# Patient Record
Sex: Male | Born: 1960
Health system: Southern US, Community
[De-identification: ages and names within clinical notes are randomized; demographics above are authoritative.]

## PROBLEM LIST (undated history)

## (undated) DIAGNOSIS — Z9889 Other specified postprocedural states: Secondary | ICD-10-CM

## (undated) DIAGNOSIS — R112 Nausea with vomiting, unspecified: Secondary | ICD-10-CM

## (undated) DIAGNOSIS — G43711 Chronic migraine without aura, intractable, with status migrainosus: Secondary | ICD-10-CM

## (undated) DIAGNOSIS — G43401 Hemiplegic migraine, not intractable, with status migrainosus: Secondary | ICD-10-CM

## (undated) DIAGNOSIS — F40240 Claustrophobia: Secondary | ICD-10-CM

## (undated) DIAGNOSIS — T7840XA Allergy, unspecified, initial encounter: Secondary | ICD-10-CM

## (undated) DIAGNOSIS — I739 Peripheral vascular disease, unspecified: Secondary | ICD-10-CM

## (undated) DIAGNOSIS — G43909 Migraine, unspecified, not intractable, without status migrainosus: Secondary | ICD-10-CM

## (undated) DIAGNOSIS — F1910 Other psychoactive substance abuse, uncomplicated: Secondary | ICD-10-CM

## (undated) DIAGNOSIS — F32A Depression, unspecified: Secondary | ICD-10-CM

## (undated) DIAGNOSIS — M1990 Unspecified osteoarthritis, unspecified site: Secondary | ICD-10-CM

## (undated) DIAGNOSIS — F10231 Alcohol dependence with withdrawal delirium: Secondary | ICD-10-CM

## (undated) DIAGNOSIS — I10 Essential (primary) hypertension: Secondary | ICD-10-CM

## (undated) DIAGNOSIS — I779 Disorder of arteries and arterioles, unspecified: Secondary | ICD-10-CM

## (undated) DIAGNOSIS — I639 Cerebral infarction, unspecified: Secondary | ICD-10-CM

## (undated) DIAGNOSIS — R9431 Abnormal electrocardiogram [ECG] [EKG]: Secondary | ICD-10-CM

## (undated) DIAGNOSIS — F329 Major depressive disorder, single episode, unspecified: Secondary | ICD-10-CM

## (undated) DIAGNOSIS — J449 Chronic obstructive pulmonary disease, unspecified: Secondary | ICD-10-CM

## (undated) DIAGNOSIS — I2510 Atherosclerotic heart disease of native coronary artery without angina pectoris: Secondary | ICD-10-CM

## (undated) DIAGNOSIS — K219 Gastro-esophageal reflux disease without esophagitis: Secondary | ICD-10-CM

## (undated) DIAGNOSIS — F419 Anxiety disorder, unspecified: Secondary | ICD-10-CM

## (undated) DIAGNOSIS — F10239 Alcohol dependence with withdrawal, unspecified: Secondary | ICD-10-CM

## (undated) DIAGNOSIS — G40909 Epilepsy, unspecified, not intractable, without status epilepticus: Secondary | ICD-10-CM

## (undated) DIAGNOSIS — F5104 Psychophysiologic insomnia: Secondary | ICD-10-CM

## (undated) DIAGNOSIS — F319 Bipolar disorder, unspecified: Secondary | ICD-10-CM

## (undated) HISTORY — DX: Essential (primary) hypertension: I10

## (undated) HISTORY — PX: COLONOSCOPY: SHX174

## (undated) HISTORY — DX: Cerebral infarction, unspecified: I63.9

## (undated) HISTORY — DX: Abnormal electrocardiogram (ECG) (EKG): R94.31

## (undated) HISTORY — DX: Psychophysiologic insomnia: F51.04

## (undated) HISTORY — PX: APPENDECTOMY: SHX54

## (undated) HISTORY — DX: Hemiplegic migraine, not intractable, with status migrainosus: G43.401

## (undated) HISTORY — DX: Claustrophobia: F40.240

## (undated) HISTORY — PX: KNEE ARTHROSCOPY: SUR90

## (undated) HISTORY — DX: Other specified postprocedural states: Z98.890

## (undated) HISTORY — DX: Allergy, unspecified, initial encounter: T78.40XA

## (undated) HISTORY — DX: Chronic obstructive pulmonary disease, unspecified: J44.9

## (undated) HISTORY — DX: Unspecified osteoarthritis, unspecified site: M19.90

## (undated) HISTORY — DX: Disorder of arteries and arterioles, unspecified: I77.9

## (undated) HISTORY — DX: Other specified postprocedural states: R11.2

## (undated) HISTORY — DX: Other psychoactive substance abuse, uncomplicated: F19.10

## (undated) HISTORY — PX: NOSE SURGERY: SHX723

## (undated) HISTORY — DX: Peripheral vascular disease, unspecified: I73.9

## (undated) HISTORY — PX: ANKLE SURGERY: SHX546

## (undated) HISTORY — DX: Atherosclerotic heart disease of native coronary artery without angina pectoris: I25.10

---
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
## 1998-06-04 ENCOUNTER — Emergency Department (HOSPITAL_COMMUNITY): Admission: EM | Admit: 1998-06-04 | Discharge: 1998-06-04 | Payer: Self-pay | Admitting: Internal Medicine

## 1999-05-06 ENCOUNTER — Encounter: Payer: Self-pay | Admitting: *Deleted

## 1999-05-06 ENCOUNTER — Encounter: Payer: Self-pay | Admitting: Emergency Medicine

## 1999-05-06 ENCOUNTER — Inpatient Hospital Stay (HOSPITAL_COMMUNITY): Admission: EM | Admit: 1999-05-06 | Discharge: 1999-05-09 | Payer: Self-pay | Admitting: Emergency Medicine

## 2002-03-23 ENCOUNTER — Emergency Department (HOSPITAL_COMMUNITY): Admission: EM | Admit: 2002-03-23 | Discharge: 2002-03-24 | Payer: Self-pay

## 2002-05-13 ENCOUNTER — Emergency Department (HOSPITAL_COMMUNITY): Admission: EM | Admit: 2002-05-13 | Discharge: 2002-05-13 | Payer: Self-pay

## 2002-05-30 ENCOUNTER — Encounter: Payer: Self-pay | Admitting: *Deleted

## 2002-05-30 ENCOUNTER — Emergency Department (HOSPITAL_COMMUNITY): Admission: EM | Admit: 2002-05-30 | Discharge: 2002-05-30 | Payer: Self-pay | Admitting: Emergency Medicine

## 2002-07-01 ENCOUNTER — Emergency Department (HOSPITAL_COMMUNITY): Admission: EM | Admit: 2002-07-01 | Discharge: 2002-07-01 | Payer: Self-pay | Admitting: Emergency Medicine

## 2002-07-06 ENCOUNTER — Encounter: Admission: RE | Admit: 2002-07-06 | Discharge: 2002-07-06 | Payer: Self-pay | Admitting: Internal Medicine

## 2003-01-12 ENCOUNTER — Ambulatory Visit (HOSPITAL_BASED_OUTPATIENT_CLINIC_OR_DEPARTMENT_OTHER): Admission: RE | Admit: 2003-01-12 | Discharge: 2003-01-12 | Payer: Self-pay | Admitting: Orthopedic Surgery

## 2003-11-30 ENCOUNTER — Other Ambulatory Visit: Payer: Self-pay

## 2004-01-24 ENCOUNTER — Emergency Department (HOSPITAL_COMMUNITY): Admission: EM | Admit: 2004-01-24 | Discharge: 2004-01-24 | Payer: Self-pay | Admitting: Emergency Medicine

## 2004-02-23 ENCOUNTER — Encounter: Admission: RE | Admit: 2004-02-23 | Discharge: 2004-02-23 | Payer: Self-pay | Admitting: Internal Medicine

## 2006-12-27 ENCOUNTER — Emergency Department (HOSPITAL_COMMUNITY): Admission: EM | Admit: 2006-12-27 | Discharge: 2006-12-28 | Payer: Self-pay | Admitting: Emergency Medicine

## 2006-12-28 ENCOUNTER — Emergency Department (HOSPITAL_COMMUNITY): Admission: EM | Admit: 2006-12-28 | Discharge: 2006-12-28 | Payer: Self-pay | Admitting: Emergency Medicine

## 2007-01-15 ENCOUNTER — Emergency Department (HOSPITAL_COMMUNITY): Admission: EM | Admit: 2007-01-15 | Discharge: 2007-01-16 | Payer: Self-pay | Admitting: Emergency Medicine

## 2007-03-12 ENCOUNTER — Emergency Department (HOSPITAL_COMMUNITY): Admission: EM | Admit: 2007-03-12 | Discharge: 2007-03-12 | Payer: Self-pay | Admitting: Emergency Medicine

## 2009-09-05 ENCOUNTER — Emergency Department (HOSPITAL_COMMUNITY): Admission: EM | Admit: 2009-09-05 | Discharge: 2009-09-06 | Payer: Self-pay | Admitting: Emergency Medicine

## 2010-03-06 ENCOUNTER — Emergency Department (HOSPITAL_COMMUNITY): Admission: EM | Admit: 2010-03-06 | Discharge: 2010-03-06 | Payer: Self-pay | Admitting: Emergency Medicine

## 2010-08-10 ENCOUNTER — Emergency Department (HOSPITAL_COMMUNITY): Admission: EM | Admit: 2010-08-10 | Discharge: 2010-08-12 | Payer: Self-pay | Admitting: Emergency Medicine

## 2010-11-03 ENCOUNTER — Emergency Department (HOSPITAL_COMMUNITY)
Admission: EM | Admit: 2010-11-03 | Discharge: 2010-11-03 | Payer: Self-pay | Source: Home / Self Care | Admitting: Emergency Medicine

## 2011-01-27 LAB — BASIC METABOLIC PANEL
BUN: 10 mg/dL (ref 6–23)
CO2: 23 mEq/L (ref 19–32)
Calcium: 9 mg/dL (ref 8.4–10.5)
Chloride: 106 mEq/L (ref 96–112)
Creatinine, Ser: 0.94 mg/dL (ref 0.4–1.5)
GFR calc Af Amer: >60 mL/min (ref >60)
GFR calc non Af Amer: >60 mL/min (ref >60)
Glucose, Bld: 94 mg/dL (ref 70–99)
Potassium: 3.8 mEq/L (ref 3.5–5.1)
Sodium: 140 mEq/L (ref 135–145)

## 2011-01-27 LAB — PROTEIN, CSF: Total  Protein, CSF: 38 mg/dL (ref 15–45)

## 2011-01-27 LAB — RAPID URINE DRUG SCREEN, HOSP PERFORMED
Amphetamines: NOT DETECTED
Barbiturates: NOT DETECTED
Benzodiazepines: NOT DETECTED
Cocaine: NOT DETECTED
Opiates: NOT DETECTED
Tetrahydrocannabinol: NOT DETECTED

## 2011-01-27 LAB — CSF CELL COUNT WITH DIFFERENTIAL
Eosinophils, CSF: 0 % (ref 0–1)
RBC Count, CSF: 0 /mm3
RBC Count, CSF: 7 /mm3 — ABNORMAL HIGH
Tube #: 1
Tube #: 4
WBC, CSF: 1 /mm3 (ref 0–5)
WBC, CSF: 1 /mm3 (ref 0–5)

## 2011-01-27 LAB — DIFFERENTIAL
Basophils Absolute: 0.1 10*3/uL (ref 0.0–0.1)
Basophils Relative: 1 % (ref 0–1)
Eosinophils Absolute: 0.7 10*3/uL (ref 0.0–0.7)
Eosinophils Relative: 8 % — ABNORMAL HIGH (ref 0–5)
Lymphocytes Relative: 49 % — ABNORMAL HIGH (ref 12–46)
Lymphs Abs: 4.3 10*3/uL — ABNORMAL HIGH (ref 0.7–4.0)
Monocytes Absolute: 0.5 10*3/uL (ref 0.1–1.0)
Monocytes Relative: 6 % (ref 3–12)
Neutro Abs: 3.2 10*3/uL (ref 1.7–7.7)
Neutrophils Relative %: 36 % — ABNORMAL LOW (ref 43–77)

## 2011-01-27 LAB — CBC
HCT: 48.2 % (ref 39.0–52.0)
Hemoglobin: 17.1 g/dL — ABNORMAL HIGH (ref 13.0–17.0)
MCH: 33.5 pg (ref 26.0–34.0)
MCHC: 35.5 g/dL (ref 30.0–36.0)
MCV: 94.5 fL (ref 78.0–100.0)
Platelets: 273 10*3/uL (ref 150–400)
RBC: 5.1 MIL/uL (ref 4.22–5.81)
RDW: 12.3 % (ref 11.5–15.5)
WBC: 8.8 10*3/uL (ref 4.0–10.5)

## 2011-01-27 LAB — CSF CULTURE W GRAM STAIN

## 2011-01-27 LAB — CSF CULTURE: Culture: NO GROWTH

## 2011-01-27 LAB — GLUCOSE, CAPILLARY: Glucose-Capillary: 123 mg/dL — ABNORMAL HIGH (ref 70–99)

## 2011-01-27 LAB — GRAM STAIN

## 2011-01-27 LAB — GLUCOSE, CSF: Glucose, CSF: 63 mg/dL (ref 43–76)

## 2011-01-27 LAB — PROTIME-INR
INR: 0.85 (ref 0.00–1.49)
Prothrombin Time: 11.8 seconds (ref 11.6–15.2)

## 2011-01-27 LAB — ETHANOL: Alcohol, Ethyl (B): 227 mg/dL — ABNORMAL HIGH (ref 0–10)

## 2011-01-27 LAB — APTT: aPTT: 28 seconds (ref 24–37)

## 2011-01-30 LAB — BASIC METABOLIC PANEL
BUN: 5 mg/dL — ABNORMAL LOW (ref 6–23)
CO2: 22 mEq/L (ref 19–32)
Calcium: 9.6 mg/dL (ref 8.4–10.5)
Chloride: 103 mEq/L (ref 96–112)
Creatinine, Ser: 0.97 mg/dL (ref 0.4–1.5)
GFR calc Af Amer: >60 mL/min (ref >60)
GFR calc non Af Amer: >60 mL/min (ref >60)
Glucose, Bld: 198 mg/dL — ABNORMAL HIGH (ref 70–99)
Potassium: 3.5 mEq/L (ref 3.5–5.1)
Sodium: 138 mEq/L (ref 135–145)

## 2011-01-30 LAB — CBC
HCT: 46.8 % (ref 39.0–52.0)
Hemoglobin: 16.5 g/dL (ref 13.0–17.0)
MCH: 34.2 pg — ABNORMAL HIGH (ref 26.0–34.0)
MCHC: 35.2 g/dL (ref 30.0–36.0)
MCV: 96.9 fL (ref 78.0–100.0)
Platelets: 308 10*3/uL (ref 150–400)
RBC: 4.83 MIL/uL (ref 4.22–5.81)
RDW: 12.5 % (ref 11.5–15.5)
WBC: 12.8 10*3/uL — ABNORMAL HIGH (ref 4.0–10.5)

## 2011-01-30 LAB — DIFFERENTIAL
Basophils Absolute: 0.1 10*3/uL (ref 0.0–0.1)
Basophils Relative: 1 % (ref 0–1)
Eosinophils Absolute: 0.2 10*3/uL (ref 0.0–0.7)
Eosinophils Relative: 1 % (ref 0–5)
Lymphocytes Relative: 21 % (ref 12–46)
Lymphs Abs: 2.7 10*3/uL (ref 0.7–4.0)
Monocytes Absolute: 0.6 10*3/uL (ref 0.1–1.0)
Monocytes Relative: 5 % (ref 3–12)
Neutro Abs: 9.2 10*3/uL — ABNORMAL HIGH (ref 1.7–7.7)
Neutrophils Relative %: 72 % (ref 43–77)

## 2011-01-30 LAB — RAPID URINE DRUG SCREEN, HOSP PERFORMED
Amphetamines: NOT DETECTED
Barbiturates: NOT DETECTED
Benzodiazepines: NOT DETECTED
Cocaine: POSITIVE — AB
Opiates: NOT DETECTED
Tetrahydrocannabinol: NOT DETECTED

## 2011-01-30 LAB — TRICYCLICS SCREEN, URINE: TCA Scrn: NOT DETECTED

## 2011-01-30 LAB — ETHANOL: Alcohol, Ethyl (B): 281 mg/dL — ABNORMAL HIGH (ref 0–10)

## 2011-02-04 LAB — DIFFERENTIAL
Basophils Absolute: 0.1 10*3/uL (ref 0.0–0.1)
Basophils Relative: 2 % — ABNORMAL HIGH (ref 0–1)
Eosinophils Absolute: 0.4 10*3/uL (ref 0.0–0.7)
Eosinophils Relative: 5 % (ref 0–5)
Lymphocytes Relative: 40 % (ref 12–46)
Lymphs Abs: 3.4 10*3/uL (ref 0.7–4.0)
Monocytes Absolute: 0.5 10*3/uL (ref 0.1–1.0)
Monocytes Relative: 6 % (ref 3–12)
Neutro Abs: 4.1 10*3/uL (ref 1.7–7.7)
Neutrophils Relative %: 48 % (ref 43–77)

## 2011-02-04 LAB — CBC
HCT: 45.9 % (ref 39.0–52.0)
Hemoglobin: 15.8 g/dL (ref 13.0–17.0)
MCHC: 34.5 g/dL (ref 30.0–36.0)
MCV: 97 fL (ref 78.0–100.0)
Platelets: 331 10*3/uL (ref 150–400)
RBC: 4.73 MIL/uL (ref 4.22–5.81)
RDW: 13.7 % (ref 11.5–15.5)
WBC: 8.6 10*3/uL (ref 4.0–10.5)

## 2011-02-04 LAB — ETHANOL: Alcohol, Ethyl (B): 290 mg/dL — ABNORMAL HIGH (ref 0–10)

## 2011-02-04 LAB — BASIC METABOLIC PANEL
BUN: 6 mg/dL (ref 6–23)
CO2: 24 mEq/L (ref 19–32)
Calcium: 9 mg/dL (ref 8.4–10.5)
Chloride: 111 mEq/L (ref 96–112)
Creatinine, Ser: 1.05 mg/dL (ref 0.4–1.5)
GFR calc Af Amer: >60 mL/min (ref >60)
GFR calc non Af Amer: >60 mL/min (ref >60)
Glucose, Bld: 105 mg/dL — ABNORMAL HIGH (ref 70–99)
Potassium: 3.6 mEq/L (ref 3.5–5.1)
Sodium: 144 mEq/L (ref 135–145)

## 2011-02-04 LAB — HEPATIC FUNCTION PANEL
ALT: 24 U/L (ref 0–53)
AST: 24 U/L (ref 0–37)
Albumin: 4.6 g/dL (ref 3.5–5.2)
Alkaline Phosphatase: 81 U/L (ref 39–117)
Bilirubin, Direct: 0.1 mg/dL (ref 0.0–0.3)
Indirect Bilirubin: 0.4 mg/dL (ref 0.3–0.9)
Total Bilirubin: 0.5 mg/dL (ref 0.3–1.2)
Total Protein: 7.4 g/dL (ref 6.0–8.3)

## 2011-04-04 NOTE — Op Note (Signed)
NAME:  Spencer Mendoza, Spencer Mendoza                       ACCOUNT NO.:  [id number]   MEDICAL RECORD NO.:  [id number]                   PATIENT TYPE:  AMB   LOCATION:  DSC                                  FACILITY:  [facility]   PHYSICIAN:  Nadara Mustard, M.D.                DATE OF BIRTH:  09/30/1961   DATE OF PROCEDURE:  01/12/2003  DATE OF DISCHARGE:                                 OPERATIVE REPORT   PREOPERATIVE DIAGNOSIS:  Medial meniscal tear, right knee.   POSTOPERATIVE DIAGNOSES:  1. Bucket-handle tear, medial meniscus.  2. Degenerative tear, lateral meniscus.   PROCEDURE:  Right knee arthroscopy with partial medial and lateral  meniscectomy.   SURGEON:  Nadara Mustard, M.D.   ANESTHESIA:  General.   ESTIMATED BLOOD LOSS:  Minimal.   ANTIBIOTICS:  None.   DRAINS:  None.   COMPLICATIONS:  None.   DISPOSITION:  To PACU in stable condition.   INDICATIONS FOR PROCEDURE:  The patient is a 50 year old gentleman with  mechanical catching and locking symptoms in his right knee.  The patient has  failed conservative care and MRI scan confirmed the medial meniscal tear and  he presents at this time for arthroscopic intervention.  The risks and  benefits were discussed including infection, neurovascular injury,  persistent pain, need for additional surgery.  The patient states he  understands and wishes to proceed at this time.   DESCRIPTION OF PROCEDURE:  The patient was brought to OR room 5 and  underwent a general anesthetic.  After adequate level of anesthesia  obtained, the patient's right lower extremity was prepped using Duraprep and  draped in a sterile field.  The scope was inserted from the inferolateral  portal and a working portal was established inferomedially.  Visualization  of the patellofemoral joint showed no pathology with no cartilage defects.  Examination of the unilateral joint line showed degenerative tearing of the  posterior horn of the lateral meniscus and on  examination of the notch he  had had an intact ACL.  Examination of the medial joint line showed him to  have a bucket-handle tear of the medial meniscus.  The ablator was used to  debride the medial meniscal tear.  The shaver was used for further  debridement and the VAPR Mitek was used for hemostasis.  Lateral joint line  in the figure-four position.  The shaver was used for debridement of the  degenerative tear.  Visualization of all three compartments after  debridement showed there to be no loose bodies.  There was no loose bodies  in the lateral gutter.  The instruments removed.  The portals were injected  with a total of 20 mL of 0.5% Marcaine plain.  The portals were  closed using 3-0 nylon.  The wounds were covered with Adaptic orthopedic  sponges, ABD dressing, Webril, and an ACE wrap from toe to the thigh.  The  patient  was extubated, taken to PACU in stable condition, discharged to  home, crutches, weightbearing as tolerated.  Change the dressing in three  days.  Plan to follow up in the office in one week.                                               Nadara Mustard, M.D.    MVD/MEDQ  D:  01/12/2003  T:  01/12/2003  Job:  [id number]

## 2013-07-28 ENCOUNTER — Inpatient Hospital Stay (HOSPITAL_COMMUNITY)
Admission: EM | Admit: 2013-07-28 | Discharge: 2013-08-03 | DRG: 894 | Discharge status: Against Medical Advice | Payer: Medicaid Other | Attending: Family Medicine | Admitting: Family Medicine

## 2013-07-28 ENCOUNTER — Encounter (HOSPITAL_COMMUNITY): Payer: Self-pay | Admitting: Emergency Medicine

## 2013-07-28 ENCOUNTER — Emergency Department (HOSPITAL_COMMUNITY): Payer: Medicaid Other

## 2013-07-28 DIAGNOSIS — R079 Chest pain, unspecified: Secondary | ICD-10-CM | POA: Diagnosis present

## 2013-07-28 DIAGNOSIS — F1020 Alcohol dependence, uncomplicated: Secondary | ICD-10-CM | POA: Diagnosis present

## 2013-07-28 DIAGNOSIS — G40909 Epilepsy, unspecified, not intractable, without status epilepticus: Secondary | ICD-10-CM | POA: Diagnosis present

## 2013-07-28 DIAGNOSIS — K920 Hematemesis: Secondary | ICD-10-CM

## 2013-07-28 DIAGNOSIS — K922 Gastrointestinal hemorrhage, unspecified: Secondary | ICD-10-CM | POA: Diagnosis present

## 2013-07-28 DIAGNOSIS — F1410 Cocaine abuse, uncomplicated: Secondary | ICD-10-CM | POA: Diagnosis present

## 2013-07-28 DIAGNOSIS — Z23 Encounter for immunization: Secondary | ICD-10-CM

## 2013-07-28 DIAGNOSIS — F10939 Alcohol use, unspecified with withdrawal, unspecified: Secondary | ICD-10-CM | POA: Diagnosis present

## 2013-07-28 DIAGNOSIS — E871 Hypo-osmolality and hyponatremia: Secondary | ICD-10-CM | POA: Diagnosis not present

## 2013-07-28 DIAGNOSIS — R1110 Vomiting, unspecified: Secondary | ICD-10-CM | POA: Diagnosis present

## 2013-07-28 DIAGNOSIS — E876 Hypokalemia: Secondary | ICD-10-CM | POA: Diagnosis not present

## 2013-07-28 DIAGNOSIS — K5900 Constipation, unspecified: Secondary | ICD-10-CM | POA: Diagnosis not present

## 2013-07-28 DIAGNOSIS — F10239 Alcohol dependence with withdrawal, unspecified: Secondary | ICD-10-CM | POA: Diagnosis present

## 2013-07-28 DIAGNOSIS — F17200 Nicotine dependence, unspecified, uncomplicated: Secondary | ICD-10-CM | POA: Diagnosis present

## 2013-07-28 DIAGNOSIS — F1010 Alcohol abuse, uncomplicated: Secondary | ICD-10-CM

## 2013-07-28 DIAGNOSIS — F10931 Alcohol use, unspecified with withdrawal delirium: Principal | ICD-10-CM | POA: Diagnosis present

## 2013-07-28 DIAGNOSIS — F10231 Alcohol dependence with withdrawal delirium: Principal | ICD-10-CM | POA: Diagnosis present

## 2013-07-28 DIAGNOSIS — K2920 Alcoholic gastritis without bleeding: Secondary | ICD-10-CM | POA: Diagnosis present

## 2013-07-28 DIAGNOSIS — F1210 Cannabis abuse, uncomplicated: Secondary | ICD-10-CM | POA: Diagnosis present

## 2013-07-28 HISTORY — DX: Migraine, unspecified, not intractable, without status migrainosus: G43.909

## 2013-07-28 HISTORY — DX: Epilepsy, unspecified, not intractable, without status epilepticus: G40.909

## 2013-07-28 LAB — RAPID URINE DRUG SCREEN, HOSP PERFORMED
Amphetamines: NOT DETECTED
Barbiturates: NOT DETECTED
Benzodiazepines: NOT DETECTED
Cocaine: POSITIVE — AB
Opiates: POSITIVE — AB
Tetrahydrocannabinol: POSITIVE — AB

## 2013-07-28 LAB — CBC
HCT: 49.3 % (ref 39.0–52.0)
HCT: 42.1 % (ref 39.0–52.0)
Hemoglobin: 17.6 g/dL — ABNORMAL HIGH (ref 13.0–17.0)
Hemoglobin: 15.3 g/dL (ref 13.0–17.0)
MCH: 34 pg (ref 26.0–34.0)
MCH: 34.3 pg — ABNORMAL HIGH (ref 26.0–34.0)
MCHC: 35.7 g/dL (ref 30.0–36.0)
MCHC: 36.3 g/dL — ABNORMAL HIGH (ref 30.0–36.0)
MCV: 95.4 fL (ref 78.0–100.0)
MCV: 94.4 fL (ref 78.0–100.0)
Platelets: 274 10*3/uL (ref 150–400)
Platelets: 194 10*3/uL (ref 150–400)
RBC: 5.17 MIL/uL (ref 4.22–5.81)
RBC: 4.46 MIL/uL (ref 4.22–5.81)
RDW: 13.1 % (ref 11.5–15.5)
RDW: 12.8 % (ref 11.5–15.5)
WBC: 13.3 10*3/uL — ABNORMAL HIGH (ref 4.0–10.5)
WBC: 6.7 10*3/uL (ref 4.0–10.5)

## 2013-07-28 LAB — HEPATIC FUNCTION PANEL
ALT: 33 U/L (ref 0–53)
AST: 60 U/L — ABNORMAL HIGH (ref 0–37)
Albumin: 4.9 g/dL (ref 3.5–5.2)
Alkaline Phosphatase: 87 U/L (ref 39–117)
Bilirubin, Direct: 0.3 mg/dL (ref 0.0–0.3)
Indirect Bilirubin: 2 mg/dL — ABNORMAL HIGH (ref 0.3–0.9)
Total Bilirubin: 2.3 mg/dL — ABNORMAL HIGH (ref 0.3–1.2)
Total Protein: 8.2 g/dL (ref 6.0–8.3)

## 2013-07-28 LAB — BASIC METABOLIC PANEL
BUN: 9 mg/dL (ref 6–23)
CO2: 23 mEq/L (ref 19–32)
Calcium: 10.5 mg/dL (ref 8.4–10.5)
Chloride: 93 mEq/L — ABNORMAL LOW (ref 96–112)
Creatinine, Ser: 0.98 mg/dL (ref 0.50–1.35)
GFR calc Af Amer: >90 mL/min (ref >90)
GFR calc non Af Amer: >90 mL/min (ref >90)
Glucose, Bld: 102 mg/dL — ABNORMAL HIGH (ref 70–99)
Potassium: 4.6 mEq/L (ref 3.5–5.1)
Sodium: 133 mEq/L — ABNORMAL LOW (ref 135–145)

## 2013-07-28 LAB — TROPONIN I: Troponin I: <0.3 ng/mL (ref <0.30)

## 2013-07-28 LAB — D-DIMER, QUANTITATIVE (NOT AT ARMC): D-Dimer, Quant: <0.27 ug/mL-FEU (ref 0.00–0.48)

## 2013-07-28 LAB — URINALYSIS, ROUTINE W REFLEX MICROSCOPIC
Bilirubin Urine: NEGATIVE
Glucose, UA: NEGATIVE mg/dL
Hgb urine dipstick: NEGATIVE
Ketones, ur: 15 mg/dL — AB
Leukocytes, UA: NEGATIVE
Nitrite: NEGATIVE
Protein, ur: NEGATIVE mg/dL
Specific Gravity, Urine: 1.014 (ref 1.005–1.030)
Urobilinogen, UA: 1 mg/dL (ref 0.0–1.0)
pH: 6.5 (ref 5.0–8.0)

## 2013-07-28 LAB — POCT I-STAT TROPONIN I: Troponin i, poc: 0 ng/mL (ref 0.00–0.08)

## 2013-07-28 LAB — LIPASE, BLOOD: Lipase: 28 U/L (ref 11–59)

## 2013-07-28 MED ORDER — LORAZEPAM 2 MG/ML IJ SOLN
1.0000 mg | Freq: Four times a day (QID) | INTRAMUSCULAR | Status: DC | PRN
  Administered 2013-07-29 – 2013-07-31 (×4): 1 mg via INTRAVENOUS
  Filled 2013-07-28 (×6): qty 1

## 2013-07-28 MED ORDER — ONDANSETRON HCL 4 MG PO TABS: 4.0000 mg | ORAL_TABLET | Freq: Four times a day (QID) | ORAL | Status: DC | PRN

## 2013-07-28 MED ORDER — ONDANSETRON HCL 4 MG/2ML IJ SOLN
4.0000 mg | Freq: Four times a day (QID) | INTRAMUSCULAR | Status: DC | PRN
  Administered 2013-07-29 – 2013-08-02 (×4): 4 mg via INTRAVENOUS
  Filled 2013-07-28 (×4): qty 2

## 2013-07-28 MED ORDER — LORAZEPAM 2 MG/ML IJ SOLN
0.0000 mg | Freq: Four times a day (QID) | INTRAMUSCULAR | Status: DC
  Administered 2013-07-28: 2 mg via INTRAVENOUS
  Filled 2013-07-28: qty 1

## 2013-07-28 MED ORDER — LORAZEPAM 1 MG PO TABS: 1.0000 mg | ORAL_TABLET | Freq: Four times a day (QID) | ORAL | Status: DC | PRN

## 2013-07-28 MED ORDER — THIAMINE HCL 100 MG/ML IJ SOLN
100.0000 mg | Freq: Every day | INTRAMUSCULAR | Status: DC
  Administered 2013-07-29: 12:00:00 via INTRAVENOUS
  Administered 2013-07-30 – 2013-08-01 (×3): 100 mg via INTRAVENOUS
  Filled 2013-07-28 (×5): qty 1

## 2013-07-28 MED ORDER — SODIUM CHLORIDE 0.9 % IV SOLN
INTRAVENOUS | Status: DC
  Administered 2013-07-28 – 2013-07-29 (×2): via INTRAVENOUS
  Administered 2013-07-29: 1000 mL via INTRAVENOUS
  Administered 2013-07-30 (×2): 125 mL/h via INTRAVENOUS
  Administered 2013-07-30: 13:00:00 via INTRAVENOUS
  Administered 2013-07-31 (×2): 125 mL/h via INTRAVENOUS
  Administered 2013-08-01: 75 mL/h via INTRAVENOUS
  Administered 2013-08-01: 125 mL/h via INTRAVENOUS

## 2013-07-28 MED ORDER — LORAZEPAM 2 MG/ML IJ SOLN
1.0000 mg | Freq: Once | INTRAMUSCULAR | Status: AC
  Administered 2013-07-28: 1 mg via INTRAVENOUS
  Filled 2013-07-28: qty 1

## 2013-07-28 MED ORDER — LORAZEPAM 1 MG PO TABS
1.0000 mg | ORAL_TABLET | Freq: Once | ORAL | Status: AC
  Administered 2013-07-28: 1 mg via ORAL
  Filled 2013-07-28: qty 1

## 2013-07-28 MED ORDER — VITAMIN B-1 100 MG PO TABS
100.0000 mg | ORAL_TABLET | Freq: Every day | ORAL | Status: DC
  Administered 2013-08-02 – 2013-08-03 (×2): 100 mg via ORAL
  Filled 2013-07-28 (×6): qty 1

## 2013-07-28 MED ORDER — MORPHINE SULFATE 4 MG/ML IJ SOLN
8.0000 mg | Freq: Once | INTRAMUSCULAR | Status: AC
  Administered 2013-07-28: 8 mg via INTRAVENOUS
  Filled 2013-07-28 (×2): qty 2

## 2013-07-28 MED ORDER — PANTOPRAZOLE SODIUM 40 MG IV SOLR
40.0000 mg | Freq: Two times a day (BID) | INTRAVENOUS | Status: DC
  Administered 2013-07-28 – 2013-08-01 (×8): 40 mg via INTRAVENOUS
  Filled 2013-07-28 (×12): qty 40

## 2013-07-28 MED ORDER — ASPIRIN 325 MG PO TABS
325.0000 mg | ORAL_TABLET | ORAL | Status: AC
  Administered 2013-07-28: 325 mg via ORAL
  Filled 2013-07-28: qty 1

## 2013-07-28 MED ORDER — MORPHINE SULFATE 4 MG/ML IJ SOLN
4.0000 mg | Freq: Once | INTRAMUSCULAR | Status: AC
  Administered 2013-07-28: 4 mg via INTRAVENOUS
  Filled 2013-07-28: qty 1

## 2013-07-28 MED ORDER — HYDROMORPHONE HCL PF 1 MG/ML IJ SOLN
1.0000 mg | INTRAMUSCULAR | Status: DC | PRN
  Administered 2013-07-28 – 2013-07-29 (×2): 1 mg via INTRAVENOUS
  Administered 2013-07-29: 0.5 mg via INTRAVENOUS
  Administered 2013-07-29 (×2): 1 mg via INTRAVENOUS
  Filled 2013-07-28 (×6): qty 1

## 2013-07-28 MED ORDER — LORAZEPAM 2 MG/ML IJ SOLN: 0.0000 mg | Freq: Two times a day (BID) | INTRAMUSCULAR | Status: DC

## 2013-07-28 MED ORDER — ONDANSETRON HCL 4 MG/2ML IJ SOLN
4.0000 mg | Freq: Once | INTRAMUSCULAR | Status: AC
  Administered 2013-07-28: 4 mg via INTRAVENOUS
  Filled 2013-07-28: qty 2

## 2013-07-28 MED ORDER — NICOTINE 14 MG/24HR TD PT24
14.0000 mg | MEDICATED_PATCH | Freq: Once | TRANSDERMAL | Status: DC
  Administered 2013-07-28: 14 mg via TRANSDERMAL
  Filled 2013-07-28: qty 1

## 2013-07-28 MED ORDER — SODIUM CHLORIDE 0.9 % IV BOLUS (SEPSIS)
1000.0000 mL | INTRAVENOUS | Status: AC
  Administered 2013-07-28: 1000 mL via INTRAVENOUS

## 2013-07-28 MED ORDER — NITROGLYCERIN 0.4 MG SL SUBL
0.4000 mg | SUBLINGUAL_TABLET | SUBLINGUAL | Status: AC | PRN
  Administered 2013-07-28 (×3): 0.4 mg via SUBLINGUAL

## 2013-07-28 MED ORDER — FOLIC ACID 1 MG PO TABS
1.0000 mg | ORAL_TABLET | Freq: Every day | ORAL | Status: DC
  Administered 2013-07-28: 1 mg via ORAL
  Filled 2013-07-28 (×4): qty 1

## 2013-07-28 MED ORDER — NICOTINE 21 MG/24HR TD PT24
21.0000 mg | MEDICATED_PATCH | Freq: Every day | TRANSDERMAL | Status: DC
  Filled 2013-07-28: qty 1

## 2013-07-28 MED ORDER — SODIUM CHLORIDE 0.9 % IJ SOLN
3.0000 mL | Freq: Two times a day (BID) | INTRAMUSCULAR | Status: DC
  Administered 2013-07-31 – 2013-08-03 (×6): 3 mL via INTRAVENOUS

## 2013-07-28 MED ORDER — ADULT MULTIVITAMIN W/MINERALS CH
1.0000 | ORAL_TABLET | Freq: Every day | ORAL | Status: DC
  Administered 2013-08-01 – 2013-08-03 (×3): 1 via ORAL
  Filled 2013-07-28 (×7): qty 1

## 2013-07-28 MED ORDER — HYDROMORPHONE HCL PF 1 MG/ML IJ SOLN
1.0000 mg | Freq: Once | INTRAMUSCULAR | Status: AC
  Administered 2013-07-28: 1 mg via INTRAVENOUS
  Filled 2013-07-28: qty 1

## 2013-07-28 NOTE — ED Notes (Signed)
Pt placed in gown, on monitor, continuous pulse oximetry and blood pressure cuff; family at bedside

## 2013-07-28 NOTE — ED Notes (Signed)
Pt handed urinal to use

## 2013-07-28 NOTE — ED Provider Notes (Signed)
Patient signed out to me by Doctor Romeo Apple to followup on cardiology consult. Cardiology has evaluated the patient and will not be admitting the patient to their service. They have recommended internal medicine admission and they have consulted.  Patient with left-sided chest pain has been present for several hours. He does report worsening with exertion. It is still present, administer morphine and placed on telemetry for further cardiac evaluation.  Gilda Crease, MD 07/28/13 [id number]

## 2013-07-28 NOTE — ED Provider Notes (Signed)
CSN: [id number]     Arrival date & time 07/28/13  1022 History   First MD Initiated Contact with Patient 07/28/13 1046     Chief Complaint  Patient presents with  . Chest Pain  . Emesis   (Consider location/radiation/quality/duration/timing/severity/associated sxs/prior Treatment) Patient is a 52 y.o. male presenting with chest pain and vomiting. The history is provided by the patient.  Chest Pain Pain location:  L chest Pain quality: tightness   Pain radiates to:  Does not radiate Pain radiates to the back: no   Pain severity:  Moderate Onset quality:  Gradual Duration:  3 hours Timing:  Constant Progression:  Worsening Chronicity:  Recurrent Context comment:  While at work Relieved by:  Nothing Worsened by:  Exertion Ineffective treatments:  None tried Associated symptoms: nausea and vomiting   Associated symptoms: no abdominal pain, no cough, no fever, no headache, no numbness and no shortness of breath   Emesis Associated symptoms: diarrhea   Associated symptoms: no abdominal pain and no headaches     History reviewed. No pertinent past medical history. History reviewed. No pertinent past surgical history. History reviewed. No pertinent family history. History  Substance Use Topics  . Smoking status: Current Every Day Smoker  . Smokeless tobacco: Not on file  . Alcohol Use: Yes    Review of Systems  Constitutional: Negative for fever.  HENT: Negative for rhinorrhea, drooling and neck pain.   Eyes: Negative for pain.  Respiratory: Negative for cough and shortness of breath.   Cardiovascular: Positive for chest pain. Negative for leg swelling.  Gastrointestinal: Positive for nausea, vomiting and diarrhea. Negative for abdominal pain.  Genitourinary: Negative for dysuria and hematuria.  Musculoskeletal: Negative for gait problem.  Skin: Negative for color change.  Neurological: Positive for syncope (near syncopal episode 5 days ago). Negative for numbness and  headaches.       Paresthesias in UE's  Hematological: Negative for adenopathy.  Psychiatric/Behavioral: Negative for behavioral problems.  All other systems reviewed and are negative.    Allergies  Review of patient's allergies indicates no known allergies.  Home Medications   Current Outpatient Rx  Name  Route  Sig  Dispense  Refill  . Aspirin-Acetaminophen-Caffeine (GOODY HEADACHE PO)   Oral   Take 1 packet by mouth daily as needed. For pain          There were no vitals taken for this visit. Physical Exam  Nursing note and vitals reviewed. Constitutional: He is oriented to person, place, and time. He appears well-developed and well-nourished.  Tremulous.   HENT:  Head: Normocephalic and atraumatic.  Right Ear: External ear normal.  Left Ear: External ear normal.  Nose: Nose normal.  Mouth/Throat: Oropharynx is clear and moist. No oropharyngeal exudate.  Eyes: Conjunctivae and EOM are normal. Pupils are equal, round, and reactive to light.  Neck: Normal range of motion. Neck supple.  Cardiovascular: Normal rate, regular rhythm, normal heart sounds and intact distal pulses.  Exam reveals no gallop and no friction rub.   No murmur heard. Pulmonary/Chest: Effort normal and breath sounds normal. No respiratory distress. He has no wheezes.  Abdominal: Soft. Bowel sounds are normal. He exhibits no distension. There is no tenderness. There is no rebound and no guarding.  Musculoskeletal: Normal range of motion. He exhibits no edema and no tenderness.  Neurological: He is alert and oriented to person, place, and time.  Skin: Skin is warm and dry.  Psychiatric: He has a normal mood and affect.  His behavior is normal.    ED Course  Procedures (including critical care time) Labs Review Labs Reviewed  CBC - Abnormal; Notable for the following:    WBC 13.3 (*)    Hemoglobin 17.6 (*)    All other components within normal limits  BASIC METABOLIC PANEL  HEPATIC FUNCTION PANEL   LIPASE, BLOOD  POCT I-STAT TROPONIN I   Imaging Review Dg Chest 2 View  07/28/2013   *RADIOLOGY REPORT*  Clinical Data: Chest pain, emesis  CHEST - 2 VIEW  Comparison:  01/15/2007  Findings:  The heart size and mediastinal contours are within normal limits.  Both lungs are clear.  The visualized skeletal structures are unremarkable.  IMPRESSION: No active cardiopulmonary disease.   Original Report Authenticated By: Judie Petit. Miles Costain, M.D.    Date: 07/28/2013  Rate: 94  Rhythm: normal sinus rhythm  QRS Axis: right  Intervals: normal  ST/T Wave abnormalities: normal  Conduction Disutrbances:none  Narrative Interpretation: No ST or T wave changes consistent with ischemia.  Poor R wave progression.  Old EKG Reviewed: none available   MDM   1. Chest pain   2. Vomiting    11:15 AM 52 y.o. male with history of EtOH abuse who presents with chest pain. The patient notes that he's had intermittent nausea vomiting and diarrhea the last several weeks. He notes that he has daily left-sided chest tightness that is worse with exertion. Approximately 3 hours ago while he was working he noted a worsening of the left-sided chest tightness. He states that the pain has been constant since then. The patient is afebrile and vital signs are unremarkable here. He only drinks 6 beers per day. He states that his last drink was last night and he only had one beer. He is tremulous on exam.  UDS positive for cocaine. I asked the pt about this, he now admits to using a small amount of cocaine 2 nights ago, but denies using any today.   Transferred care to Dr. Blinda Leatherwood while awaiting cardiology consult.   Junius Argyle, MD 07/28/13 2051

## 2013-07-28 NOTE — H&P (Signed)
[facility] Admission History and Physical Service Pager: [phone]  Patient name: Spencer Mendoza Medical record number: [id number] Date of birth: 12-30-1960 Age: 52 y.o. Gender: male  Primary Care Provider: No PCP Per Patient Consultants: Cardiology, GI Code Status: Full  Chief Complaint: Chest pain  Assessment and Plan:Spencer Mendoza is a 52 y.o. male presenting with acute on chronic chest pain, Blood streaked to frank blood vomit and melana for 1 month. [name] PMH is significant for seizure disorder (childhood) and alcohol abuse. UDS positive for cocaine and THC.   Chest Pain: Cardiology consulted in ED. They felt CP was atypical. EKG with right axis deviation and no ST changes. POCT trop and cycled x1 are normal. Lipase normal, ruling out pancreatitis. VSS. Small concern for PE, with atypical chest pain. Well's score : 3.6% low risk. Heart score: 3. Admitted to telemetry unit. - Dilaudid for pain - Cycled trop x3 ordered - We appreciated cardiology consultation and believe he will likely need cardiac work up for CAD.  - D-Dimer obtained d/t right axis deviation and atypical presentation of pain near diaphragm.  - ECHO ordered - Risk stratification labs ordered  Vomit/diarrhea: - GI consulted for Melana and hematemesis;  we appreciate their recommendations and for examining this patient. Patient placed on NPO after midnight for possible endoscopy procedure tomorrow.  - Started PPI IV BID - Zofran - Trending hemoglobins, patient was dry on initial exam and is a heavy smoker. Initial hgb 17 --> 15.3 - FOBT  Substance abuse: Patient admits to daily alcohol use and marijuana use. His UDS was positive for cocaine and THC, however he states he took a small taste via his mouth because his friend told him it would help with his nausea, otherwise he never used it before. Last drink was last night, mild tremors noted on exam.  - Discussed the negative effects of  marijuana and cyclic vomiting. Discussed negative effects of cocaine use with chest pain.  - CIWA protocol with ativan    Epilepsy: Childhood history of seizures. He was prescribed dilatin, but has not been taking for over 8 years. He states he has not had a seizure for >8 years.   FEN/GI: Heart healthy --> NPO after midnight  Prophylaxis: SCD's  Disposition: Pending GI recommendations and further cardiac work-up.   History of Present Illness: Spencer Mendoza is a 52 y.o. male presenting with acute on chronic chest pain. PMH is significant for seizure disorder (childhood) and alcohol abuse. Patient endorses everyday chest pain on exertion for the past year. He has difficulty lying flat d/t shortness of breath (but only sleeps on one large pillow). He experienced a sharp pain and tightness, in the left side of his chest that radiated to bilateral upper extremities on Sunday. He reports tingling in his fingers at this time. The pain lasted "hours" and had to lay on the couch until it went away. He experienced an episode of vomit at this time as well, that was reportedly "blood streaked." He endorses daily vomiting for the past month (1 1/2 months), that he experiences blood streaks or "fills the entire bowl with red". He reports he has been able to tolerate very little on his stomach without vomit. He also endorses 1 month of "black tarry liquid stools." He denies use of iron supplementation. He has tried to stop the diarrhea with Pepto bismol and imodium, with no relief. He has not taken Mylanta, Pepto or imodium in over a week and  he continues to have "black" stools. On the day of admission, he again experienced a sharp/tight feeling in the left side of his chest while attempting to lift something heavy at work. 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 He admits to frequent episodes of dizziness  while bending over, and increased fatigue over the last month. He admits to daily ETOH consumption of 5 beers, more if there is an event or game he is watching and daily marijuana use. He frequently uses BC powder for headaches (1 packet a day, >4 days a week). He smokes about 1 PPD.   Review Of Systems: Per HPI  Otherwise 12 point review of systems was performed and was unremarkable.  Patient Active Problem List   Diagnosis Date Noted  . Chest pain 07/28/2013  . Vomiting 07/28/2013   Past Medical History: Past Medical History  Diagnosis Date  . Epilepsy   . Migraine    Past Surgical History: Past Surgical History  Procedure Laterality Date  . Knee arthroscopy Right   . Appendectomy     Social History: History  Substance Use Topics  . Smoking status: Current Every Day Smoker -- 1.00 packs/day for 35 years  . Smokeless tobacco: Not on file  . Alcohol Use: Yes     Comment: Drinks 4-5 beers daily   Additional social history: daily Marijuana use Please also refer to relevant sections of EMR.  Family History: History reviewed. No pertinent family history. Allergies and Medications: No Known Allergies No current facility-administered medications on file prior to encounter.   No current outpatient prescriptions on file prior to encounter.    Objective: BP 140/88  Pulse 62  Resp 20  SpO2 96% Exam: General: Mildly distressed after 8 mg morphine. Pleasant thin male. Cooperative and honest.  HEENT: AT. [place]. Bilateral TM visualized and normal in appearance. Bilateral eyes without icterus, mild injections present. Tachy mucous membranes. Bilateral nares normal. Throat without erythema or exudates. Poor dentition with multiple missing and decaying teeth.  CV: RRR. No murmur appreciated.  Chest: CTAB, no wheeze or crackles Abd: Soft.ND. TTP LUQ. BS present. No Masses palpated. tattoo present. Ext: No erythema. No edema. 2/4 bilateral pulses. Mild UE tremors bilaterally.  Skin:  No rashes, purpura or petechiae.  Neuro:  PERLA. EOMi. Alert. CN II-XII Grossly intact. No focal deficits. Msk strength 5/5 UE/LE.    Labs and Imaging: CBC BMET   Recent Labs Lab 07/28/13 1034  WBC 13.3*  HGB 17.6*  HCT 49.3  PLT 274    Recent Labs Lab 07/28/13 1034  NA 133*  K 4.6  CL 93*  CO2 23  BUN 9  CREATININE 0.98  GLUCOSE 102*  CALCIUM 10.5     Dg Chest 2 View  07/28/2013   *RADIOLOGY REPORT*  Clinical Data: Chest pain, emesis  CHEST - 2 VIEW  Comparison:  01/15/2007  Findings:  The heart size and mediastinal contours are within normal limits.  Both lungs are clear.  The visualized skeletal structures are unremarkable.  IMPRESSION: No active cardiopulmonary disease.   Original Report Authenticated By: Judie Petit. Miles Costain, M.D.    Natalia Leatherwood, DO 07/28/2013, 6:59 PM PGY-2, [facility] Intern pager: [phone], text pages welcome

## 2013-07-28 NOTE — ED Notes (Signed)
Pt given another cup of ice water; family at bedside; ok'd by [employer]

## 2013-07-28 NOTE — Consult Note (Signed)
Consult   Patient ID: Spencer Mendoza MRN: [id number], DOB/AGE: [birth year] 54 y.o. Date of Encounter: 07/28/2013  Primary Physician: No PCP Per Patient - DOT physical 8 mo ago Primary Cardiologist: New  Chief Complaint:  Chest pain  HPI: Spencer Mendoza is a 52 y.o. male with no history of CAD. Pt has had daily chest pain with exertion for a year or 2. It is left-sided, dull ache. It is a 2-3/10. He will have it for hours or days.  Pt had severe chest pain 5 days ago, started without exertion. He had decreased LOC episode, no seizure activity. The pain was worse with deep inspiration or cough. It radiated to his left arm.  His chest wall was not tender. He felt short of breath and light-headed. No diaphoresis, nausea or vomiting. The severe pain lasted 24 hours, at a 10/10. He did not try any medications for it. The pain improved to a 2/10 (his usual). That did not change until today. He was exerting himself at work and the pain increased to a 10/10. He became light-headed and was brought to the ER. In the ER, he received SL NTG x 3 (no change in the pain), morphine 4 mg, Ativan 1 mg, Dilaudid 1 mg, and ASA 325 mg.  He has been having problems with N&V, sometimes multiple times in a day, black stools, occasional red streaks in the vomitus. He has felt cold recently and had chills, but did not check a temperature.  Past Medical History  Diagnosis Date  . Epilepsy   . Migraine     Surgical History:  Past Surgical History  Procedure Laterality Date  . Knee arthroscopy Right   . Appendectomy       I have reviewed the patient's current medications. Prior to Admission medications   Medication Sig Start Date End Date Taking? Authorizing Provider  Aspirin-Acetaminophen-Caffeine (GOODY HEADACHE PO) Take 1 packet by mouth daily as needed. For pain   Yes Historical Provider, MD   Scheduled Meds: . nicotine  14 mg Transdermal Once   Continuous Infusions:  PRN Meds:.  Allergies: No  Known Allergies  History   Social History  . Marital Status: Divorced    Spouse Name: N/A    Number of Children: N/A  . Years of Education: N/A   Occupational History  . Operates heavy machinery    Social History Main Topics  . Smoking status: Current Every Day Smoker -- 1.00 packs/day for 35 years  . Smokeless tobacco: Not on file  . Alcohol Use: Yes     Comment: Drinks 4-5 beers daily  . Drug Use: No  . Sexual Activity: Not on file   Other Topics Concern  . Not on file   Social History Narrative   Pt lives with significant other.     Family Status  Relation Status Death Age  . Father Alive Born in [place]    Had CABG age 7  . Mother Deceased 25    Had CABG age 76    Review of Systems:   Full 14-point review of systems otherwise negative except as noted above.  Physical Exam: Blood pressure 140/88, pulse 62, resp. rate 20, SpO2 96.00%. General: Well developed, well nourished,male extremely anxious and tremulous. Head: Normocephalic, atraumatic, sclera non-icteric, no xanthomas, nares are without discharge. Dentition: poor Neck: No carotid bruits. JVD not elevated. No thyromegally Lungs: Good expansion bilaterally. without wheezes or rhonchi.  Heart: IRRegular rate and rhythm with S1 S2.  No  S3 or S4.  No murmur, no rubs, or gallops appreciated. Abdomen: Soft, non-tender, non-distended with normoactive bowel sounds. No hepatomegaly. No rebound/guarding. No obvious abdominal masses. Msk:  Strength and tone appear normal for age. No joint deformities or effusions, no spine or costo-vertebral angle tenderness. Extremities: No clubbing or cyanosis. No edema.  Distal pedal pulses are 2+ in 4 extrem Neuro: Alert and oriented X 3. Moves all extremities spontaneously. No focal deficits noted. Skin: No rashes or lesions noted; tattoos  Labs:   Lab Results  Component Value Date   WBC 13.3* 07/28/2013   HGB 17.6* 07/28/2013   HCT 49.3 07/28/2013   MCV 95.4 07/28/2013   PLT  274 07/28/2013     Recent Labs Lab 07/28/13 1034  NA 133*  K 4.6  CL 93*  CO2 23  BUN 9  CREATININE 0.98  CALCIUM 10.5  PROT 8.2  BILITOT 2.3*  ALKPHOS 87  ALT 33  AST 60*  GLUCOSE 102*    Recent Labs  07/28/13 1041  TROPIPOC 0.00   Drugs of Abuse     Component Value Date/Time   LABOPIA POSITIVE* 07/28/2013 1406   COCAINSCRNUR POSITIVE* 07/28/2013 1406   LABBENZ NONE DETECTED 07/28/2013 1406   AMPHETMU NONE DETECTED 07/28/2013 1406   THCU POSITIVE* 07/28/2013 1406   LABBARB NONE DETECTED 07/28/2013 1406     Radiology/Studies: Dg Chest 2 View 07/28/2013   *RADIOLOGY REPORT*  Clinical Data: Chest pain, emesis  CHEST - 2 VIEW  Comparison:  01/15/2007  Findings:  The heart size and mediastinal contours are within normal limits.  Both lungs are clear.  The visualized skeletal structures are unremarkable.  IMPRESSION: No active cardiopulmonary disease.   Original Report Authenticated By: Judie Petit. Shick, M.D.    ECG: sinus rhythm, right axis deviation, no ST changes.  ASSESSMENT AND PLAN:  Active Problems:   Chest pain   Vomiting   Signed, Spencer Demark, Spencer Mendoza 07/28/2013 5:54 PM Beeper [phone]  As above, patient seen and examined. Briefly he is a 52 year old male with a remote history of epilepsy who I last evaluated for chest pain. The patient has had continuous chest pain in the left breast area for 2 years. Sunday he had another pain that was more severe. He stated his left arm was tingling. It lasted 24 hours. He had shortness of breath and diaphoresis. He has had nausea continuously for 3 weeks. The pain never completely resolved but increased again today and he presented to the emergency room. He also states he has had hematemesis with his nausea and vomiting and black stools. He is noted to have cocaine in his drug screen. Electrocardiogram shows sinus rhythm, right axis deviation and no ST changes. 1 chest pain-patient symptoms are extremely atypical. He has had continuous  chest pain for 2 years. His more recent episode which was more severe has been present for approximately 4 days. Initial enzymes are negative. Electrocardiogram shows right axis deviation but no ST changes. Would continue cycle enzymes and if negative patient can have an exercise treadmill for risk stratification. He may need GI evaluation with description of hematemesis and melena. Will leave to primary care. Would follow hemoglobin and treat with Protonix. Given right axis deviation would check d-dimer. 2 substance abuse-cocaine in the patient's system. He states he did this once 2 days ago for nausea. I discussed the importance of avoiding in future. He is very tremulous at the time of this evaluation. I wonder about the possibility of withdrawal. Management per  primary care. Spencer Mendoza 6:30 PM

## 2013-07-28 NOTE — ED Notes (Signed)
Pt c/o left sided CP and bilateral arm numbness; pt sts N/V; pt noted to be shaking all over; pt admits to heavy ETOH abuse normally; pt sts near syncope today and Saturday; pt sts decreased ETOH intake since being sick last drank 1 beer last night

## 2013-07-28 NOTE — ED Notes (Signed)
Pt states last drink was around 7pm last night. He states he normally drinks 5-6 beers a day. Pt c/o left sided cp with no radiation described as tightness.

## 2013-07-29 ENCOUNTER — Other Ambulatory Visit: Payer: Self-pay

## 2013-07-29 ENCOUNTER — Encounter (HOSPITAL_COMMUNITY): Payer: Self-pay | Admitting: *Deleted

## 2013-07-29 DIAGNOSIS — F10239 Alcohol dependence with withdrawal, unspecified: Secondary | ICD-10-CM | POA: Diagnosis present

## 2013-07-29 DIAGNOSIS — F10231 Alcohol dependence with withdrawal delirium: Secondary | ICD-10-CM

## 2013-07-29 DIAGNOSIS — K922 Gastrointestinal hemorrhage, unspecified: Secondary | ICD-10-CM | POA: Diagnosis present

## 2013-07-29 DIAGNOSIS — I059 Rheumatic mitral valve disease, unspecified: Secondary | ICD-10-CM

## 2013-07-29 DIAGNOSIS — F10931 Alcohol use, unspecified with withdrawal delirium: Secondary | ICD-10-CM

## 2013-07-29 DIAGNOSIS — F1010 Alcohol abuse, uncomplicated: Secondary | ICD-10-CM

## 2013-07-29 DIAGNOSIS — R1110 Vomiting, unspecified: Secondary | ICD-10-CM

## 2013-07-29 DIAGNOSIS — K920 Hematemesis: Secondary | ICD-10-CM

## 2013-07-29 DIAGNOSIS — F10939 Alcohol use, unspecified with withdrawal, unspecified: Secondary | ICD-10-CM

## 2013-07-29 HISTORY — DX: Alcohol dependence with withdrawal, unspecified: F10.239

## 2013-07-29 HISTORY — DX: Alcohol use, unspecified with withdrawal delirium: F10.931

## 2013-07-29 HISTORY — DX: Alcohol dependence with withdrawal delirium: F10.231

## 2013-07-29 HISTORY — DX: Alcohol use, unspecified with withdrawal, unspecified: F10.939

## 2013-07-29 LAB — CBC
HCT: 43.7 % (ref 39.0–52.0)
Hemoglobin: 15.7 g/dL (ref 13.0–17.0)
MCH: 34.2 pg — ABNORMAL HIGH (ref 26.0–34.0)
MCHC: 35.9 g/dL (ref 30.0–36.0)
MCV: 95.2 fL (ref 78.0–100.0)
Platelets: 181 10*3/uL (ref 150–400)
RBC: 4.59 MIL/uL (ref 4.22–5.81)
RDW: 12.8 % (ref 11.5–15.5)
WBC: 8.2 10*3/uL (ref 4.0–10.5)

## 2013-07-29 LAB — LIPID PANEL
Cholesterol: 159 mg/dL (ref 0–200)
HDL: 82 mg/dL (ref >39)
LDL Cholesterol: 57 mg/dL (ref 0–99)
Total CHOL/HDL Ratio: 1.9 RATIO
Triglycerides: 99 mg/dL (ref <150)
VLDL: 20 mg/dL (ref 0–40)

## 2013-07-29 LAB — COMPREHENSIVE METABOLIC PANEL
ALT: 25 U/L (ref 0–53)
ALT: 25 U/L (ref 0–53)
AST: 38 U/L — ABNORMAL HIGH (ref 0–37)
AST: 36 U/L (ref 0–37)
Albumin: 3.6 g/dL (ref 3.5–5.2)
Albumin: 3.6 g/dL (ref 3.5–5.2)
Alkaline Phosphatase: 69 U/L (ref 39–117)
Alkaline Phosphatase: 68 U/L (ref 39–117)
BUN: 7 mg/dL (ref 6–23)
BUN: 7 mg/dL (ref 6–23)
CO2: 24 mEq/L (ref 19–32)
CO2: 24 mEq/L (ref 19–32)
Calcium: 9 mg/dL (ref 8.4–10.5)
Calcium: 8.8 mg/dL (ref 8.4–10.5)
Chloride: 98 mEq/L (ref 96–112)
Chloride: 99 mEq/L (ref 96–112)
Creatinine, Ser: 0.92 mg/dL (ref 0.50–1.35)
Creatinine, Ser: 0.93 mg/dL (ref 0.50–1.35)
GFR calc Af Amer: >90 mL/min (ref >90)
GFR calc Af Amer: >90 mL/min (ref >90)
GFR calc non Af Amer: >90 mL/min (ref >90)
GFR calc non Af Amer: >90 mL/min (ref >90)
Glucose, Bld: 82 mg/dL (ref 70–99)
Glucose, Bld: 77 mg/dL (ref 70–99)
Potassium: 3.4 mEq/L — ABNORMAL LOW (ref 3.5–5.1)
Potassium: 3.5 mEq/L (ref 3.5–5.1)
Sodium: 133 mEq/L — ABNORMAL LOW (ref 135–145)
Sodium: 134 mEq/L — ABNORMAL LOW (ref 135–145)
Total Bilirubin: 1.4 mg/dL — ABNORMAL HIGH (ref 0.3–1.2)
Total Bilirubin: 1.4 mg/dL — ABNORMAL HIGH (ref 0.3–1.2)
Total Protein: 6.5 g/dL (ref 6.0–8.3)
Total Protein: 6.3 g/dL (ref 6.0–8.3)

## 2013-07-29 LAB — TROPONIN I
Troponin I: <0.3 ng/mL (ref <0.30)
Troponin I: <0.3 ng/mL (ref <0.30)

## 2013-07-29 LAB — HEMOGLOBIN A1C
Hgb A1c MFr Bld: 4.9 % (ref <5.7)
Mean Plasma Glucose: 94 mg/dL (ref <117)

## 2013-07-29 LAB — MRSA PCR SCREENING: MRSA by PCR: NEGATIVE

## 2013-07-29 LAB — TSH: TSH: 3.746 u[IU]/mL (ref 0.350–4.500)

## 2013-07-29 LAB — PROTIME-INR
INR: 0.93 (ref 0.00–1.49)
Prothrombin Time: 12.3 seconds (ref 11.6–15.2)

## 2013-07-29 MED ORDER — INFLUENZA VAC SPLIT QUAD 0.5 ML IM SUSP
0.5000 mL | INTRAMUSCULAR | Status: AC
  Administered 2013-07-30: 0.5 mL via INTRAMUSCULAR
  Filled 2013-07-29: qty 0.5

## 2013-07-29 MED ORDER — LORAZEPAM 2 MG/ML IJ SOLN
4.0000 mg | Freq: Once | INTRAMUSCULAR | Status: AC
  Administered 2013-07-29: 2 mg via INTRAVENOUS
  Filled 2013-07-29: qty 2

## 2013-07-29 MED ORDER — DEXMEDETOMIDINE HCL IN NACL 200 MCG/50ML IV SOLN
0.4000 ug/kg/h | INTRAVENOUS | Status: DC
  Administered 2013-07-29 – 2013-07-30 (×2): 0.5 ug/kg/h via INTRAVENOUS
  Administered 2013-07-30: 1.023 ug/kg/h via INTRAVENOUS
  Administered 2013-07-30: 0.409 ug/kg/h via INTRAVENOUS
  Administered 2013-07-30: 0.5 ug/kg/h via INTRAVENOUS
  Filled 2013-07-29 (×7): qty 50

## 2013-07-29 MED ORDER — HYDROMORPHONE HCL PF 1 MG/ML IJ SOLN
0.5000 mg | INTRAMUSCULAR | Status: DC | PRN
  Administered 2013-07-30 – 2013-07-31 (×5): 0.5 mg via INTRAVENOUS
  Filled 2013-07-29 (×4): qty 1

## 2013-07-29 MED ORDER — POTASSIUM CHLORIDE CRYS ER 20 MEQ PO TBCR: 40.0000 meq | EXTENDED_RELEASE_TABLET | Freq: Once | ORAL | Status: DC

## 2013-07-29 MED ORDER — NICOTINE 21 MG/24HR TD PT24
21.0000 mg | MEDICATED_PATCH | Freq: Every day | TRANSDERMAL | Status: DC
  Administered 2013-07-29 – 2013-08-01 (×4): 21 mg via TRANSDERMAL
  Filled 2013-07-29 (×5): qty 1

## 2013-07-29 MED ORDER — LORAZEPAM 2 MG/ML IJ SOLN
1.0000 mg | INTRAMUSCULAR | Status: DC
  Administered 2013-07-29: 2 mg via INTRAVENOUS
  Administered 2013-07-29: 4 mg via INTRAVENOUS
  Administered 2013-07-29 (×2): 2 mg via INTRAVENOUS
  Administered 2013-07-29: 4 mg via INTRAVENOUS
  Administered 2013-07-29 (×4): 2 mg via INTRAVENOUS
  Administered 2013-07-29: 4 mg via INTRAVENOUS
  Administered 2013-07-30 (×6): 2 mg via INTRAVENOUS
  Administered 2013-07-30: 4 mg via INTRAVENOUS
  Administered 2013-07-30: 2 mg via INTRAVENOUS
  Administered 2013-07-30: 3 mg via INTRAVENOUS
  Administered 2013-07-30 (×2): 2 mg via INTRAVENOUS
  Administered 2013-07-30: 4 mg via INTRAVENOUS
  Administered 2013-07-30 (×2): 2 mg via INTRAVENOUS
  Administered 2013-07-31 (×4): 4 mg via INTRAVENOUS
  Administered 2013-07-31: 1 mg via INTRAVENOUS
  Filled 2013-07-29 (×4): qty 1
  Filled 2013-07-29: qty 2
  Filled 2013-07-29 (×4): qty 1
  Filled 2013-07-29: qty 2
  Filled 2013-07-29: qty 1
  Filled 2013-07-29: qty 2
  Filled 2013-07-29 (×3): qty 1
  Filled 2013-07-29 (×3): qty 2
  Filled 2013-07-29: qty 1
  Filled 2013-07-29 (×2): qty 2
  Filled 2013-07-29 (×6): qty 1
  Filled 2013-07-29: qty 2

## 2013-07-29 NOTE — Progress Notes (Signed)
I discussed with  Dr wight.  I agree with their plans documented in their progress  note for today. 

## 2013-07-29 NOTE — Progress Notes (Addendum)
Pt  With increasing  Anxiety/ agitation/ tremors, received 4mg  IV ativan  But continues to insist on  Getting  Oob. Girlfriend  At bedside, but Web designer  About  Withdrawal symptoms  Became  Very  Agitated/aggravated . MD to come reassess

## 2013-07-29 NOTE — Consult Note (Signed)
PULMONARY  / CRITICAL CARE MEDICINE  Name: Spencer Mendoza MRN: [id number] DOB: 19-Jan-1961    ADMISSION DATE:  07/28/2013 CONSULTATION DATE:  07/29/2013  REFERRING MD :  Mauricio Po  CHIEF COMPLAINT:  Altered mental status  BRIEF PATIENT DESCRIPTION:  52 yo male smoker with hx of ETOH presented with chest pain, n/v with hematemesis and melena.  UDS positive for opiates, cocaine, THC on admit.  Developed DT's and transfer to ICU.  PCCM assumed care in ICU.  SIGNIFICANT EVENTS: 9/11 Admit to tele 9/12 Cardiology, GI consulted; Develop DT's >> transfer to ICU and PCCM assume care  STUDIES:  9/12 Echo >> 9/12 Abd u/s >>  LINES / TUBES: PIV  CULTURES: None  ANTIBIOTICS: None  HISTORY OF PRESENT ILLNESS:   52 yo male with hx of polysubstance abuse presented with chest pain, n/v, hematemesis, and melena.  He had syncopal episode at work, and was brought to ER.  He was admitted to telemetry by FPTS.  He had cardiology evaluation and GI evaluation.  He had progressive agitation with concern for alcohol withdrawal with delirium tremens requiring frequent doses of ativan.  He was transferred to ICU and PCCM asked to assume care in ICU.  PAST MEDICAL HISTORY :  Past Medical History  Diagnosis Date  . Epilepsy   . Migraine    Past Surgical History  Procedure Laterality Date  . Knee arthroscopy Right   . Appendectomy     Prior to Admission medications   Medication Sig Start Date End Date Taking? Authorizing Provider  Aspirin-Acetaminophen-Caffeine (GOODY HEADACHE PO) Take 1 packet by mouth daily as needed. For pain   Yes Historical Provider, MD   No Known Allergies  FAMILY HISTORY:  History reviewed. No pertinent family history. SOCIAL HISTORY:  reports that he has been smoking.  He does not have any smokeless tobacco history on file. He reports that  drinks alcohol. He reports that he does not use illicit drugs.  REVIEW OF SYSTEMS:  Pt confused and unable to give. His sister is  in the room and states he has been alternating between lethargic and agitated. Has a cough, ? Productive. No more hematemesis reported today  SUBJECTIVE:  Confused, denies complaints  VITAL SIGNS: Temp:  [98 F (36.7 C)-98.2 F (36.8 C)] 98 F (36.7 C) (09/12 1200) Pulse Rate:  [59-97] 97 (09/12 1200) Resp:  [13-24] 20 (09/12 1200) BP: (101-146)/(56-95) 128/89 mmHg (09/12 1200) SpO2:  [92 %-100 %] 100 % (09/12 1200) Weight:  [172 lb 6.4 oz (78.2 kg)] 172 lb 6.4 oz (78.2 kg) (09/11 2251) HEMODYNAMICS:   VENTILATOR SETTINGS:   INTAKE / OUTPUT: Intake/Output     09/11 0701 - 09/12 0700 09/12 0701 - 09/13 0700   Total Intake(mL/kg)     Net   0          PHYSICAL EXAMINATION: General:  Ill appearing, thin man, sitting side of bed Neuro:  Tremulous, confused, disoriented, awake but easily drifts to sleep. Normal gross strength, no focal signs HEENT:  OP moist, no lesions Cardiovascular:  Tachy, no M Lungs:  Clear B Abdomen:  Soft, NT, hypoactive BS Musculoskeletal:  No deformities, no edema Skin:  Multiple tattoos, no rash  LABS:  CBC Recent Labs     07/28/13  1034  07/28/13  2059  07/29/13  0825  WBC  13.3*  6.7  8.2  HGB  17.6*  15.3  15.7  HCT  49.3  42.1  43.7  PLT  274  194  181   Coag's Recent Labs     07/29/13  0040  INR  0.93   BMET Recent Labs     07/28/13  1034  07/29/13  0040  07/29/13  0825  NA  133*  133*  134*  K  4.6  3.4*  3.5  CL  93*  98  99  CO2  23  24  24   BUN  9  7  7   CREATININE  0.98  0.92  0.93  GLUCOSE  102*  82  77   Electrolytes Recent Labs     07/28/13  1034  07/29/13  0040  07/29/13  0825  CALCIUM  10.5  9.0  8.8   Sepsis Markers No results found for this basename: LACTICACIDVEN, PROCALCITON, O2SATVEN,  in the last 72 hours ABG No results found for this basename: PHART, PCO2ART, PO2ART,  in the last 72 hours Liver Enzymes Recent Labs     07/28/13  1034  07/29/13  0040  07/29/13  0825  AST  60*  38*  36   ALT  33  25  25  ALKPHOS  87  69  68  BILITOT  2.3*  1.4*  1.4*  ALBUMIN  4.9  3.6  3.6   Cardiac Enzymes Recent Labs     07/28/13  1931  07/29/13  0040  07/29/13  0825  TROPONINI  <0.30  <0.30  <0.30   Glucose No results found for this basename: GLUCAP,  in the last 72 hours  Imaging Dg Chest 2 View  07/28/2013   *RADIOLOGY REPORT*  Clinical Data: Chest pain, emesis  CHEST - 2 VIEW  Comparison:  01/15/2007  Findings:  The heart size and mediastinal contours are within normal limits.  Both lungs are clear.  The visualized skeletal structures are unremarkable.  IMPRESSION: No active cardiopulmonary disease.   Original Report Authenticated By: Judie Petit. Shick, M.D.    ASSESSMENT / PLAN:  PULMONARY A: At risk for aspiration. Hx of tobacco abuse. P:   -monitor oxygenation -f/u CXR 8/12 pm -nicotine patch  CARDIOVASCULAR A: Atypical chest pain in setting of cocaine abuse. P:  -f/u Echo result  -anxiolytics, pain meds as needed -monitor on tele  RENAL A:  No acute issues. P:   -monitor renal fx, urine outpt, electrolytes -maintain volume status in setting of DT's  GASTROINTESTINAL A:  GI bleed >> likely upper in setting of ETOH. P:   -Continue protonix bid -NPO -GI following >> suspect will require need EGD -thiamine, folic acid, dextrose in IV fluid -consider empiric addition octreotide if decompensates  HEMATOLOGIC A:  GI bleed. P:  -f/u CBC -transfuse for Hb < 7 or bleeding -SCD for DVT prevention  INFECTIOUS A:  No evidence for infection. P:   -monitor clinically  ENDOCRINE A:  No issues.   P:   -monitor blood sugar on BMET  NEUROLOGIC A:  Alcohol withdrawal. Hx of seizure disorder since childhood >> not on medications as outpt. P:   -precedex to be added 9/12 -prn ativan  TODAY'S SUMMARY:  52 yo male with polysubstance abuse and presumed DT's transfer to ICU.  I have personally obtained a history, examined the patient, evaluated laboratory and  imaging results, formulated the assessment and plan and placed orders. CRITICAL CARE: The patient is critically ill with multiple organ systems failure and requires high complexity decision making for assessment and support, frequent evaluation and titration of therapies, application of advanced monitoring technologies and extensive  interpretation of multiple databases. Critical Care Time devoted to patient care services described in this note is 60 minutes.    Levy Pupa, MD, PhD 07/29/2013, 6:13 PM [facility] [phone] or if no answer [phone]

## 2013-07-29 NOTE — Progress Notes (Signed)
Pt admitted from 3W via hospital stretcher w/ O2, monitor and RN; Pt assisted to unit hospital bed and attached to unit monitors and O2; Pt introduced to staff and unit, MRSA swab done and 6 cloth CHG bath complete. Questions answered and support give, Pt's family to be brought to bedside for update soon.

## 2013-07-29 NOTE — Progress Notes (Signed)
Family Medicine Teaching Service Daily Progress Note Intern Pager: [phone]  Patient name: Spencer Mendoza Medical record number: [id number] Date of birth: 1961/10/18 Age: 52 y.o. Gender: male  Primary Care Provider: No PCP Per Patient Consultants: Cardiology, GI Code Status: Full  Pt Overview and Major Events to Date:   Assessment and Plan: Spencer Mendoza is a 52 y.o. male presenting with acute on chronic chest pain, Blood streaked to frank blood vomit and melana for 1 month. [name] PMH is significant for seizure disorder (childhood) and alcohol abuse. UDS positive for cocaine and THC.   # Chest Pain: Cardiology consulted in ED. They felt CP was atypical. EKG with right axis deviation and no ST changes. Troponin neg x 3. Lipase normal, ruling out pancreatitis. VSS. Small concern for PE, with atypical chest pain, d-dimer nl <0.27. Well's score : 3.6% low risk. Heart score: 3.  - Dilaudid for pain  - We appreciated cardiology consultation and believe he will likely need cardiac work up for CAD.  - ECHO ordered  - Repeat EKG unchanged - Risk stratification labs: Lipids and TSH wnl, A1c  # Vomit/diarrhea: concern for varices or Mallory-weiss tear. CXR showed no free air.  - GI consulted for Melana and hematemesis; we appreciate their recommendations and for examining this patient. Patient placed on NPO after midnight for possible endoscopy procedure today.  - Protonix IV BID  - Zofran  - Initial Hgb 17 --> 15.3 --> 15.7 this AM - FOBT, occult blood (gastric), c. diff need to be collected  # Substance abuse: Patient admits to daily alcohol use and marijuana use. His UDS was positive for cocaine and THC, however he states he took a small taste via his mouth because his friend told him it would help with his nausea, otherwise he never used it before. Last drink was last night, mild tremors noted on exam.  - Discussed the negative effects of marijuana and cyclic vomiting. Discussed negative  effects of cocaine use with chest pain.  - CIWA protocol with ativan: overnight scores 5>7>11 at 8am. Received 4 doses of ativan over last day. Patient undergoing alcohol withdrawal.  # Epilepsy: Childhood history of seizures. He was prescribed dilatin, but has not been taking for over 8 years. He states he has not had a seizure for >8 years.   FEN/GI: Heart healthy --> NPO after midnight  Prophylaxis: SCD's  Disposition: pending GI consult and clinical course  Subjective:  Patient sleeping (recent ativan dose), when awoken says he has pain in his chest. Says the pain feels sharp/tight, unchanged from yesterday. Also complains of dizziness and SOB (which he says he normally has). GF at bedside says he didn't have any BMs overnight and was wretching around 5:15am, but didn't actually vomit.   Objective: Temp:  [98.1 F (36.7 C)-98.2 F (36.8 C)] 98.2 F (36.8 C) (09/12 0500) Pulse Rate:  [59-92] 73 (09/12 0500) Resp:  [10-26] 24 (09/11 2145) BP: (101-151)/(56-104) 134/56 mmHg (09/12 0500) SpO2:  [91 %-99 %] 98 % (09/12 0500) Weight:  [172 lb 6.4 oz (78.2 kg)] 172 lb 6.4 oz (78.2 kg) (09/11 2251) Physical Exam: General: Laying in bed sleeping (just received ativan) Cardiovascular: RRR, normal heart sounds Respiratory: CTAB, effort normal Abdomen: Bowel sounds present. Tenderness and involuntary guarding near epigastrium. Extremities: no edema in legs, 2+ PT pulses bilaterally.  Laboratory:  Recent Labs Lab 07/28/13 1034 07/28/13 2059  WBC 13.3* 6.7  HGB 17.6* 15.3  HCT 49.3 42.1  PLT 274 194  Recent Labs Lab 07/28/13 1034 07/29/13 0040  NA 133* 133*  K 4.6 3.4*  CL 93* 98  CO2 23 24  BUN 9 7  CREATININE 0.98 0.92  CALCIUM 10.5 9.0  PROT 8.2 6.5  BILITOT 2.3* 1.4*  ALKPHOS 87 69  ALT 33 25  AST 60* 38*  GLUCOSE 102* 82   Lipid panel wnl: Chol 159, TG 99, HDL 82, LDL 57, VLDL 20 Troponin neg x 3 Initial Cmet: AST 60, ALT 33, Tbili 2.3  Imaging/Diagnostic  Tests: 2v CXR: IMPRESSION:  No active cardiopulmonary disease.   Tawni Carnes, MD 07/29/2013, 7:18 AM PGY-1, [facility] FPTS Intern pager: [phone], text pages welcome

## 2013-07-29 NOTE — Progress Notes (Signed)
PT Cancellation Note  Patient Details Name: Spencer Mendoza MRN: [id number] DOB: 09-27-1961   Cancelled Treatment:    Reason Eval/Treat Not Completed: Medical issues which prohibited therapy (Pt appears to be in active withdrawal)   Malikiah Debarr 07/29/2013, 11:17 AM

## 2013-07-29 NOTE — Progress Notes (Signed)
Patient ID: Spencer Mendoza, male   DOB: [birth year], 52 y.o.   MRN: [id number]    Subjective:  Chest pain tremulous and some nausea  Objective:  Filed Vitals:   07/28/13 2130 07/28/13 2145 07/28/13 2251 07/29/13 0500  BP: 122/84 145/91 138/84 134/56  Pulse: 59 66 69 73  Temp:   98.1 F (36.7 C) 98.2 F (36.8 C)  TempSrc:   Oral Oral  Resp: 14 24    Height:   6\' 1"  (1.854 m)   Weight:   172 lb 6.4 oz (78.2 kg)   SpO2: 95% 95% 95% 98%     Physical Exam: Affect appropriate Chronically ill white male HEENT: normal Neck supple with no adenopathy JVP normal no bruits no thyromegaly Lungs clear with no wheezing and good diaphragmatic motion Heart:  S1/S2 no murmur, no rub, gallop or click PMI normal Abdomen: benighn, BS positve, no tenderness, no AAA no bruit.  No HSM or HJR Distal pulses intact with no bruits No edema Neuro non-focal tremulous  Skin warm and dry  Multiple tatoos No muscular weakness   Lab Results: Basic Metabolic Panel:  Recent Labs  40/98/11 1034 07/29/13 0040  NA 133* 133*  K 4.6 3.4*  CL 93* 98  CO2 23 24  GLUCOSE 102* 82  BUN 9 7  CREATININE 0.98 0.92  CALCIUM 10.5 9.0   Liver Function Tests:  Recent Labs  07/28/13 1034 07/29/13 0040  AST 60* 38*  ALT 33 25  ALKPHOS 87 69  BILITOT 2.3* 1.4*  PROT 8.2 6.5  ALBUMIN 4.9 3.6    Recent Labs  07/28/13 1034  LIPASE 28   CBC:  Recent Labs  07/28/13 1034 07/28/13 2059  WBC 13.3* 6.7  HGB 17.6* 15.3  HCT 49.3 42.1  MCV 95.4 94.4  PLT 274 194   Cardiac Enzymes:  Recent Labs  07/28/13 1931 07/29/13 0040  TROPONINI <0.30 <0.30   D-Dimer:  Recent Labs  07/28/13 2059  DDIMER <0.27    Imaging: Dg Chest 2 View  07/28/2013   *RADIOLOGY REPORT*  Clinical Data: Chest pain, emesis  CHEST - 2 VIEW  Comparison:  01/15/2007  Findings:  The heart size and mediastinal contours are within normal limits.  Both lungs are clear.  The visualized skeletal structures are  unremarkable.  IMPRESSION: No active cardiopulmonary disease.   Original Report Authenticated By: Judie Petit. Miles Costain, M.D.    Cardiac Studies:  ECG:  SR rate 94  RAD  No acute changes   Telemetry:  NSR no VT 07/29/2013   Echo:   Medications:   . folic acid  1 mg Oral Daily  . LORazepam  1-4 mg Intravenous Q2H  . multivitamin with minerals  1 tablet Oral Daily  . nicotine  21 mg Transdermal Daily  . pantoprazole (PROTONIX) IV  40 mg Intravenous Q12H  . potassium chloride  40 mEq Oral Once  . sodium chloride  3 mL Intravenous Q12H  . thiamine  100 mg Oral Daily   Or  . thiamine  100 mg Intravenous Daily     . sodium chloride 125 mL/hr at 07/28/13 2302    Assessment/Plan:  Atypical chest pain in setting of drug use and possible withdrawal.  No ECG changes normal CXR and r/o despite  Continuous pain.  If echo normal no need for further w/u  Continue lorazepam and thiamine    Charlton Haws 07/29/2013, 8:19 AM

## 2013-07-29 NOTE — Consult Note (Signed)
Referring Provider: Family medicine Teaching service Primary Care Physician:  No PCP Per Patient Primary Gastroenterologist:  None,unassigned.  Reason for Consultation:  Nausea,vomiting and reported  hematemesis  HPI: Spencer Mendoza is a 52 y.o. male admitted last evening through the emergency room. Patient had complaints of chest pain over the past 5 days and initially started without exertion area currently he had complained of some shortness of breath and lightheadedness. He had had one episode of more severe pain which resolved and then yesterday had an episode of chest pain which she stated was "10 out of 10 and occurred with exertion and therefore came to the emergency room. He has been seen by cardiology, has ruled out for MI . Chest x-ray was unremarkable. Patient is currently sedated and history is obtained from his girlfriend. Apparently has also been having repeated episodes of nausea and vomiting over the past month 2 point of almost daily episodes. She states that he usually has vomiting early in the morning. She has noted that he has intermittently bringing up streaks of bright red blood and has had a couple of episodes of more frank blood noted in the commode. She is not certain that he has been having any abdominal pain but has complained of heartburn and indigestion. He does take Goody powders and occasional PVCs usually one daily. He is also been a long-term daily user of alcohol with 5 or 6 beers per day. Has not had any prior GI issues and a prior GI evaluation. His drug screen was positive for cocaine and THC. His girlfriend states that this is not a daily thing but occasional. He has been placed on an alcohol withdrawal protocol and is currently sedated and therefore unable to answer questions. His labs are reviewed. Is no evidence of cirrhosis by labs. Hemoglobin is in the 15 range . Has not had any other imaging.   Past Medical History  Diagnosis Date  . Epilepsy   .  Migraine     Past Surgical History  Procedure Laterality Date  . Knee arthroscopy Right   . Appendectomy      Prior to Admission medications   Medication Sig Start Date End Date Taking? Authorizing Provider  Aspirin-Acetaminophen-Caffeine (GOODY HEADACHE PO) Take 1 packet by mouth daily as needed. For pain   Yes Historical Provider, MD    Current Facility-Administered Medications  Medication Dose Route Frequency Provider Last Rate Last Dose  . 0.9 %  sodium chloride infusion   Intravenous Continuous Renee A Kuneff, DO 125 mL/hr at 07/29/13 0827 1,000 mL at 07/29/13 0827  . folic acid (FOLVITE) tablet 1 mg  1 mg Oral Daily Gilda Crease, MD   1 mg at 07/28/13 2008  . HYDROmorphone (DILAUDID) injection 1 mg  1 mg Intravenous Q2H PRN Renee A Kuneff, DO   1 mg at 07/29/13 0952  . [START ON 07/30/2013] influenza vac split quadrivalent PF (FLUARIX) injection 0.5 mL  0.5 mL Intramuscular Tomorrow-1000 Barbaraann Barthel, MD      . LORazepam (ATIVAN) tablet 1 mg  1 mg Oral Q6H PRN Gilda Crease, MD       Or  . LORazepam (ATIVAN) injection 1 mg  1 mg Intravenous Q6H PRN Gilda Crease, MD   1 mg at 07/29/13 0103  . LORazepam (ATIVAN) injection 1-4 mg  1-4 mg Intravenous Q2H Renee A Kuneff, DO   2 mg at 07/29/13 1317  . multivitamin with minerals tablet 1 tablet  1 tablet Oral  Daily Gilda Crease, MD      . nicotine (NICODERM CQ - dosed in mg/24 hours) patch 21 mg  21 mg Transdermal Daily Tawni Carnes, MD   21 mg at 07/29/13 1211  . ondansetron (ZOFRAN) tablet 4 mg  4 mg Oral Q6H PRN Renee A Kuneff, DO       Or  . ondansetron (ZOFRAN) injection 4 mg  4 mg Intravenous Q6H PRN Renee A Kuneff, DO   4 mg at 07/29/13 0550  . pantoprazole (PROTONIX) injection 40 mg  40 mg Intravenous Q12H Renee A Kuneff, DO   40 mg at 07/29/13 1317  . potassium chloride SA (K-DUR,KLOR-CON) CR tablet 40 mEq  40 mEq Oral Once Renee A Kuneff, DO      . sodium chloride 0.9 % injection 3 mL  3  mL Intravenous Q12H Renee A Kuneff, DO      . thiamine (VITAMIN B-1) tablet 100 mg  100 mg Oral Daily Gilda Crease, MD       Or  . thiamine (B-1) injection 100 mg  100 mg Intravenous Daily Gilda Crease, MD        Allergies as of 07/28/2013  . (No Known Allergies)    History reviewed. No pertinent family history.  History   Social History  . Marital Status: Divorced    Spouse Name: N/A    Number of Children: N/A  . Years of Education: N/A   Occupational History  . Operates heavy machinery    Social History Main Topics  . Smoking status: Current Every Day Smoker -- 1.00 packs/day for 35 years  . Smokeless tobacco: Not on file  . Alcohol Use: Yes     Comment: Drinks 4-5 beers daily  . Drug Use: No  . Sexual Activity: Not on file   Other Topics Concern  . Not on file   Social History Narrative   Pt lives with significant other.     Review of Systems: Pt unable to answer.  Physical Exam: Vital signs in last 24 hours: Temp:  [98 F (36.7 C)-98.2 F (36.8 C)] 98 F (36.7 C) (09/12 1200) Pulse Rate:  [59-97] 97 (09/12 1200) Resp:  [13-26] 20 (09/12 1200) BP: (101-146)/(56-95) 128/89 mmHg (09/12 1200) SpO2:  [92 %-100 %] 100 % (09/12 1200) Weight:  [172 lb 6.4 oz (78.2 kg)] 172 lb 6.4 oz (78.2 kg) (09/11 2251)   General: Sedated ,  Well-developed, well-nourished, WM, girlfriend at bedside Head:  Normocephalic and atraumatic. Eyes:  Sclera clear, no icterus.   Conjunctiva pink. Ears:  Normal auditory acuity. Nose:  No deformity, discharge,  or lesions. Mouth:  No deformity or lesions.   Neck:  Supple; no masses or thyromegaly. Lungs:  Clear throughout to auscultation.   No wheezes, crackles, or rhonchi. Heart:  Regular rate and rhythm; no murmurs, clicks, rubs,  or gallops. Abdomen:  Soft,nontender, BS active,nonpalp mass or hsm., large tattoo Rectal:  Deferred  Msk:  Symmetrical without gross deformities. . Pulses:  Normal pulses  noted. Extremities:  Without clubbing or edema. Neurologic:  sedated Skin:  Intact without significant lesions or rashes..   Intake/Output from previous day:   Intake/Output this shift:    Lab Results:  Recent Labs  07/28/13 1034 07/28/13 2059 07/29/13 0825  WBC 13.3* 6.7 8.2  HGB 17.6* 15.3 15.7  HCT 49.3 42.1 43.7  PLT 274 194 181   BMET  Recent Labs  07/28/13 1034 07/29/13 0040 07/29/13 0825  NA 133*  133* 134*  K 4.6 3.4* 3.5  CL 93* 98 99  CO2 23 24 24   GLUCOSE 102* 82 77  BUN 9 7 7   CREATININE 0.98 0.92 0.93  CALCIUM 10.5 9.0 8.8   LFT  Recent Labs  07/28/13 1034  07/29/13 0825  PROT 8.2  < > 6.3  ALBUMIN 4.9  < > 3.6  AST 60*  < > 36  ALT 33  < > 25  ALKPHOS 87  < > 68  BILITOT 2.3*  < > 1.4*  BILIDIR 0.3  --   --   IBILI 2.0*  --   --   < > = values in this interval not displayed. PT/INR  Recent Labs  07/29/13 0040  LABPROT 12.3  INR 0.93        Studies/Results: Dg Chest 2 View  07/28/2013   *RADIOLOGY REPORT*  Clinical Data: Chest pain, emesis  CHEST - 2 VIEW  Comparison:  01/15/2007  Findings:  The heart size and mediastinal contours are within normal limits.  Both lungs are clear.  The visualized skeletal structures are unremarkable.  IMPRESSION: No active cardiopulmonary disease.   Original Report Authenticated By: Judie Petit. Miles Costain, M.D.    IMPRESSION:  #68 52 year old male admitted with chest pain and complaints of shortness of breath. Initial cardiac workup negative #2 daily nausea and vomiting x1 month with intermittent low-grade hematemesis. Patient has no evidence of any significant blood loss with hemoglobin in the 15 range. He may have esophagitis from repeated emesis and also may have an EtOH-induced gastropathy. Etiology of the nausea and vomiting is not clear again may be EtOH and/or drug induced, will need to rule out biliary disease. #3 alcoholism-currently on withdrawal protocol and sedated #4 polysubstance abuse #5  smoker  PLAN: #1 twice-daily PPI #2 serial hemoglobins #3 will schedule for upper abdominal ultrasound #4 anti-emetics as needed #5 no immediate plan for EGD   Amy Esterwood  07/29/2013, 2:12 PM    GI ATTENDING  History, laboratories, x-rays reviewed. Patient personally seen and examined. Agree with H&P as outlined above. Asked to see for minor coffee ground emesis. No significant bleeding overnight. Hemoglobin normal. Primary problem is polysubstance abuse. Suspected problems with nausea and vomiting related to polysubstance abuse. May also have alcoholic gastritis or GERD. Primary service to treat substance abuse related issues. Agree with empiric PPI. Would recommend discharging him home on daily PPI. In addition, he needs to obtain from drugs and alcohol. May need social service input and formal program. Primary team can range. We will obtain abdominal ultrasound to evaluate his liver and gallbladder. Discussed with his sister who was in the room. Will follow up tomorrow.  Wilhemina Bonito. Eda Keys., M.D. [facility] Division of Gastroenterology

## 2013-07-29 NOTE — Progress Notes (Signed)
Utilization review complete. Sabrea Sankey RN CCM Case Mgmt  

## 2013-07-29 NOTE — Progress Notes (Signed)
Spoke with  RR nurse ,will come to evaluate patient.

## 2013-07-29 NOTE — H&P (Signed)
FMTS Attending Admit Note Patient seen and examined by me, discussed with admitting resident and I agree with her assess/plan.  Patient presents with chest pain, nausea/vomiting and reports of blood-tinged sputum and melenotic stool.  He has had a negative cardiac workup thus far.  His history is also notable for polysubstance abuse, with a reported history of 4-5 beers/daily, and a UDS on admission that is positive for cocaine and THC.  He reports that his last alcohol drink was the day before this exam in the morning (Thursday, Sept 11th). In light of history and exam, I am most suspicious for GI causes of this chest pain; differential includes esophagitis, esophageal variceal disease, PUD, reflux, and upper GI malignancy.  He has not exhibited signs of hemodynamic instability.  PPI, continued monitoring of Hgb/Hct, and GI consult for evaluation.  Would withhold B-blockers in light of his cocaine-positive UDS.  Agree with CIWA protocol for alcohol withdrawal. Paula Compton, MD

## 2013-07-29 NOTE — Progress Notes (Signed)
Patient received  2mg  ativan  Per  Onetime  Order.

## 2013-07-29 NOTE — Progress Notes (Signed)
  Echocardiogram 2D Echocardiogram has been performed.  Georgian Co 07/29/2013, 5:14 PM

## 2013-07-29 NOTE — Progress Notes (Addendum)
Pt  oob  ,shuffling gait  ,girlfriend attempting to redirect but requires staff to  Assist  Back to bed,/uncooperative  MD  Notified  ,received 4mg  IV ativan x 1.pt  Disoriented to  Situation .   Will call report to  2300 for  Transfer.

## 2013-07-30 ENCOUNTER — Inpatient Hospital Stay (HOSPITAL_COMMUNITY): Payer: Medicaid Other

## 2013-07-30 DIAGNOSIS — R079 Chest pain, unspecified: Secondary | ICD-10-CM

## 2013-07-30 LAB — BASIC METABOLIC PANEL
BUN: 8 mg/dL (ref 6–23)
CO2: 20 mEq/L (ref 19–32)
Calcium: 8.6 mg/dL (ref 8.4–10.5)
Chloride: 101 mEq/L (ref 96–112)
Creatinine, Ser: 0.82 mg/dL (ref 0.50–1.35)
GFR calc Af Amer: >90 mL/min (ref >90)
GFR calc non Af Amer: >90 mL/min (ref >90)
Glucose, Bld: 68 mg/dL — ABNORMAL LOW (ref 70–99)
Potassium: 3.4 mEq/L — ABNORMAL LOW (ref 3.5–5.1)
Sodium: 134 mEq/L — ABNORMAL LOW (ref 135–145)

## 2013-07-30 LAB — POCT I-STAT 3, ART BLOOD GAS (G3+)
Acid-base deficit: 6 mmol/L — ABNORMAL HIGH (ref 0.0–2.0)
Bicarbonate: 18.6 mEq/L — ABNORMAL LOW (ref 20.0–24.0)
O2 Saturation: 99 %
Patient temperature: 97.8
TCO2: 20 mmol/L (ref 0–100)
pCO2 arterial: 34.8 mmHg — ABNORMAL LOW (ref 35.0–45.0)
pH, Arterial: 7.334 — ABNORMAL LOW (ref 7.350–7.450)
pO2, Arterial: 127 mmHg — ABNORMAL HIGH (ref 80.0–100.0)

## 2013-07-30 LAB — CBC
HCT: 41.5 % (ref 39.0–52.0)
Hemoglobin: 14.5 g/dL (ref 13.0–17.0)
MCH: 33.7 pg (ref 26.0–34.0)
MCHC: 34.9 g/dL (ref 30.0–36.0)
MCV: 96.5 fL (ref 78.0–100.0)
Platelets: 158 10*3/uL (ref 150–400)
RBC: 4.3 MIL/uL (ref 4.22–5.81)
RDW: 12.6 % (ref 11.5–15.5)
WBC: 6 10*3/uL (ref 4.0–10.5)

## 2013-07-30 LAB — GLUCOSE, CAPILLARY: Glucose-Capillary: 71 mg/dL (ref 70–99)

## 2013-07-30 LAB — MAGNESIUM: Magnesium: 1.6 mg/dL (ref 1.5–2.5)

## 2013-07-30 MED ORDER — DEXMEDETOMIDINE HCL IN NACL 400 MCG/100ML IV SOLN
0.4000 ug/kg/h | INTRAVENOUS | Status: DC
  Administered 2013-07-30: 1.5 ug/kg/h via INTRAVENOUS
  Administered 2013-07-30: 2.1 ug/kg/h via INTRAVENOUS
  Administered 2013-07-30: 1.8 ug/kg/h via INTRAVENOUS
  Administered 2013-07-30 – 2013-07-31 (×2): 2 ug/kg/h via INTRAVENOUS
  Administered 2013-07-31: 2.3 ug/kg/h via INTRAVENOUS
  Administered 2013-07-31: 0.5 ug/kg/h via INTRAVENOUS
  Administered 2013-07-31: 2.2 ug/kg/h via INTRAVENOUS
  Administered 2013-07-31 (×2): 2 ug/kg/h via INTRAVENOUS
  Administered 2013-08-01: 0.4 ug/kg/h via INTRAVENOUS
  Filled 2013-07-30 (×9): qty 100

## 2013-07-30 MED ORDER — ALBUTEROL SULFATE (5 MG/ML) 0.5% IN NEBU
2.5000 mg | INHALATION_SOLUTION | Freq: Three times a day (TID) | RESPIRATORY_TRACT | Status: DC
  Administered 2013-07-31 – 2013-08-01 (×4): 2.5 mg via RESPIRATORY_TRACT
  Filled 2013-07-30 (×5): qty 0.5

## 2013-07-30 MED ORDER — IPRATROPIUM BROMIDE 0.02 % IN SOLN
0.5000 mg | Freq: Three times a day (TID) | RESPIRATORY_TRACT | Status: DC
  Administered 2013-07-31 – 2013-08-01 (×4): 0.5 mg via RESPIRATORY_TRACT
  Filled 2013-07-30 (×4): qty 2.5

## 2013-07-30 MED ORDER — ALBUTEROL SULFATE (5 MG/ML) 0.5% IN NEBU
2.5000 mg | INHALATION_SOLUTION | Freq: Four times a day (QID) | RESPIRATORY_TRACT | Status: DC
  Administered 2013-07-30: 2.5 mg via RESPIRATORY_TRACT
  Filled 2013-07-30 (×2): qty 0.5

## 2013-07-30 MED ORDER — CHLORHEXIDINE GLUCONATE 0.12 % MT SOLN
15.0000 mL | Freq: Two times a day (BID) | OROMUCOSAL | Status: DC
  Administered 2013-07-31 – 2013-08-03 (×6): 15 mL via OROMUCOSAL
  Filled 2013-07-30 (×10): qty 15

## 2013-07-30 MED ORDER — BIOTENE DRY MOUTH MT LIQD
15.0000 mL | Freq: Two times a day (BID) | OROMUCOSAL | Status: DC
  Administered 2013-07-30 – 2013-08-03 (×9): 15 mL via OROMUCOSAL

## 2013-07-30 MED ORDER — IPRATROPIUM BROMIDE 0.02 % IN SOLN
0.5000 mg | Freq: Four times a day (QID) | RESPIRATORY_TRACT | Status: DC
  Administered 2013-07-30: 0.5 mg via RESPIRATORY_TRACT
  Filled 2013-07-30: qty 2.5

## 2013-07-30 NOTE — Progress Notes (Signed)
Patient ID: Spencer Mendoza, male   DOB: 12/24/1960, 52 y.o.   MRN: [id number] Family Medicine Teaching Service Follow up Note:  52 yo male with h/o epilepsy and EtOH abuse who was admitted for chest pain. Developed DT's and was transferred to [facility] care for DT management.  Currently on precedex drip at 0.29mcg/kg/hr and NS fluids. Now calm and sleeping per RN On exam:  Filed Vitals:   07/30/13 0700 07/30/13 0800 07/30/13 0900 07/30/13 1000  BP: 140/79 135/76 131/78 137/77  Pulse: 50 49 50 48  Temp:  97.6 F (36.4 C)    TempSrc:  Oral    Resp: 15 17 15 13   Height:      Weight:      SpO2: 91% 93% 94% 92%   General: Sleeping comfortably CV: bradycardic, normal rhythm, S1S2, no murmur appreciated Pulm: clear to auscultation bilaterally on anterior exam Abd: soft, non distended, non tender Extr: trace pedal edema  A/P:  - continue DT management per CCM's management. Greatly appreciate their management for our patient.  - will gladly resume care once patient deemed appropriate for transfer.   Marena Chancy, PGY-3  Family Medicine Resident

## 2013-07-30 NOTE — Progress Notes (Signed)
CCM MD Dr Belinda Block called r/t Pt HR of 50-55 bpm on precedex, discussed current dose Dilaudid for severe pain; updated on Pt status and mentation, orders received to decr. Dilaudid dose and continue to monitor Pt hemodynamics for now.

## 2013-07-30 NOTE — Progress Notes (Signed)
Patient ID: Spencer Mendoza, male   DOB: May 27, 1961, 52 y.o.   MRN: [id number]   Two-dimensional echo shows normal left ventricular function. It was noted yesterday that no further cardiac workup would be needed if left ventricular function was normal. Cardiology will sign off.  Jerral Bonito, MD

## 2013-07-30 NOTE — Progress Notes (Signed)
Patient ID: Spencer Mendoza, male   DOB: May 18, 1961, 52 y.o.   MRN: [id number] [facility] Progress Note  Subjective: Pt  Now on precedex for Dt's due to agitation No melena or vomiting per nurse Abd [place] - Negative HGB 14.5  Objective:  Vital signs in last 24 hours: Temp:  [97.6 F (36.4 C)-98.1 F (36.7 C)] 97.6 F (36.4 C) (09/13 0800) Pulse Rate:  [48-105] 48 (09/13 1000) Resp:  [10-26] 13 (09/13 1000) BP: (95-149)/(67-89) 137/77 mmHg (09/13 1000) SpO2:  [91 %-100 %] 92 % (09/13 1000)   General:  Tremulous- agitated when wakesWell-developed,    in NAD Heart:  Regular rate and rhythm; no murmurs Pulm;clear Abdomen:  Soft, nontender and nondistended. Normal bowel sounds, without guarding, and without rebound.   Extremities:  Without edema. Neurologic:  Alert and  oriented x4;  grossly normal neurologically. Psych:  Alert and cooperative. Normal mood and affect.  Intake/Output from previous day: 09/12 0701 - 09/13 0700 In: 4133.9 [I.V.:4133.9] Out: 600 [Urine:600] Intake/Output this shift: Total I/O In: 398.6 [I.V.:398.6] Out: 600 [Urine:600]  Lab Results:  Recent Labs  07/28/13 2059 07/29/13 0825 07/30/13 0441  WBC 6.7 8.2 6.0  HGB 15.3 15.7 14.5  HCT 42.1 43.7 41.5  PLT 194 181 158   BMET  Recent Labs  07/29/13 0040 07/29/13 0825 07/30/13 0441  NA 133* 134* 134*  K 3.4* 3.5 3.4*  CL 98 99 101  CO2 24 24 20   GLUCOSE 82 77 68*  BUN 7 7 8   CREATININE 0.92 0.93 0.82  CALCIUM 9.0 8.8 8.6   LFT  Recent Labs  07/28/13 1034  07/29/13 0825  PROT 8.2  < > 6.3  ALBUMIN 4.9  < > 3.6  AST 60*  < > 36  ALT 33  < > 25  ALKPHOS 87  < > 68  BILITOT 2.3*  < > 1.4*  BILIDIR 0.3  --   --   IBILI 2.0*  --   --   < > = values in this interval not displayed. PT/INR  Recent Labs  07/29/13 0040  LABPROT 12.3  INR 0.93     Assessment / Plan: #1  52 yo male with Dt's-on Precedex #2 polysubstance abuse #3 N/V with repoted hematemesis  prior to admit- no evidence for bleeding since admit and hgb in 14-15 range- no plans for EGD, No cirrhosis by labs or [place] so not concerned with varices. No plans for EGD at this time, continue BID PPI, and discharge on QD PPI-stop etoh and drug use GI will be available  Active Problems:   Chest pain   Vomiting   Delirium tremens   Alcohol withdrawal   UGIB (upper gastrointestinal bleed)     LOS: 2 days   Spencer Mendoza  07/30/2013, 11:28 AM   GI ATTENDING  Interval history and laboratories reviewed. Patient seen and examined. Agree with H&P as outlined above. No GI bleeding. Stable hemoglobin. Ultrasound negative. Continue PPI therapy. No further GI assessment for treatment indicated at this time. Primary team to treat alcohol/12 withdrawal issues. GI will sign off.  Spencer Mendoza. Spencer Mendoza., M.D. [facility] Division of Gastroenterology

## 2013-07-30 NOTE — Progress Notes (Signed)
PULMONARY  / CRITICAL CARE MEDICINE  Name: Spencer Mendoza MRN: [id number] DOB: [birth year]    ADMISSION DATE:  07/28/2013 CONSULTATION DATE:  07/29/2013  REFERRING MD :  Mauricio Po  CHIEF COMPLAINT:  Altered mental status  BRIEF PATIENT DESCRIPTION:  52 yo male smoker with hx of ETOH presented with chest pain, n/v with hematemesis and melena.  UDS positive for opiates, cocaine, THC on admit.  Developed DT's and transfer to ICU.  PCCM assumed care in ICU.  SIGNIFICANT EVENTS: 9/11 Admit to tele 9/12 Cardiology, GI consulted; Develop DT's >> transfer to ICU and PCCM assume care 9/13 "coughing fit", desat, requiring NRB  STUDIES:  9/12 Echo >>EF 60-65%, mild mitral regurg, systolic function normal  9/12 Abd u/s >>negative   LINES / TUBES: PIV  CULTURES: None  ANTIBIOTICS: None  SUBJECTIVE:  This pm pt had "coughing spell", forceful coughing productive of thick yellow sputum.  Afterwards remained hypoxic, sats 84 on 35% vm, now requiring NRB.  Agitation controlled on precedex.   VITAL SIGNS: Temp:  [97.6 F (36.4 C)-98.1 F (36.7 C)] 97.8 F (36.6 C) (09/13 1200) Pulse Rate:  [48-105] 48 (09/13 1400) Resp:  [10-26] 15 (09/13 1400) BP: (95-152)/(67-88) 152/88 mmHg (09/13 1400) SpO2:  [91 %-99 %] 95 % (09/13 1400) HEMODYNAMICS:   VENTILATOR SETTINGS:   INTAKE / OUTPUT: Intake/Output     09/12 0701 - 09/13 0700 09/13 0701 - 09/14 0700   I.V. (mL/kg) 4133.9 (52.9) 947.9 (12.1)   Total Intake(mL/kg) 4133.9 (52.9) 947.9 (12.1)   Urine (mL/kg/hr) 600 (0.3) 600 (1)   Total Output 600 600   Net +3533.9 +347.9        Urine Occurrence  1 x     PHYSICAL EXAMINATION: General:  Ill appearing, thin man, NAD Neuro:  Sedated, arousable, RASS -1, opens eyes to voice, tracks, no focal defecit HEENT:  OP moist, no lesions Cardiovascular:  s1s2 rrr, brady 50's Lungs:  Scattered rhonchi, diminished L, resps even, mildly labored  Abdomen:  Soft, NT, hypoactive BS Musculoskeletal:   No deformities, no edema Skin:  Multiple tattoos, no rash  LABS:  CBC Recent Labs     07/28/13  2059  07/29/13  0825  07/30/13  0441  WBC  6.7  8.2  6.0  HGB  15.3  15.7  14.5  HCT  42.1  43.7  41.5  PLT  194  181  158   Coag's Recent Labs     07/29/13  0040  INR  0.93   BMET Recent Labs     07/29/13  0040  07/29/13  0825  07/30/13  0441  NA  133*  134*  134*  K  3.4*  3.5  3.4*  CL  98  99  101  CO2  24  24  20   BUN  7  7  8   CREATININE  0.92  0.93  0.82  GLUCOSE  82  77  68*   Electrolytes Recent Labs     07/29/13  0040  07/29/13  0825  07/30/13  0441  CALCIUM  9.0  8.8  8.6   Sepsis Markers No results found for this basename: LACTICACIDVEN, PROCALCITON, O2SATVEN,  in the last 72 hours ABG No results found for this basename: PHART, PCO2ART, PO2ART,  in the last 72 hours Liver Enzymes Recent Labs     07/28/13  1034  07/29/13  0040  07/29/13  0825  AST  60*  38*  36  ALT  33  25  25  ALKPHOS  87  69  68  BILITOT  2.3*  1.4*  1.4*  ALBUMIN  4.9  3.6  3.6   Cardiac Enzymes Recent Labs     07/28/13  1931  07/29/13  0040  07/29/13  0825  TROPONINI  <0.30  <0.30  <0.30   Glucose No results found for this basename: GLUCAP,  in the last 72 hours  Imaging US Abdomen Complete  07/30/2013   CLINICAL DATA:  Nausea and vomiting.  EXAM: ABDOMEN ULTRASOUND  COMPARISON:  None.  FINDINGS: Gallbladder  No gallstones or wall thickening. Negative sonographic Murphy's sign.  Common bile duct  Diameter: 3mm  Liver  No focal lesion identified. Within normal limits in parenchymal echogenicity.  IVC  No abnormality visualized.  Pancreas  Limited visualization, without abnormality detected on  Spleen  Although the spleen measures large at 14 cm maximal span, it does not appear thickened at 4 cm maximal thickness.  Right Kidney  Length: 11 cm Echogenicity within normal limits. No mass or hydronephrosis visualized.  Left Kidney  Length: 11 cm Echogenicity within normal  limits. No mass or hydronephrosis visualized.  Abdominal aorta  No aneurysm visualized.  IMPRESSION: Negative abdominal ultrasound.   Electronically Signed   By: Tiburcio Pea   On: 07/30/2013 02:56    ASSESSMENT / PLAN:  PULMONARY A: At risk for aspiration. Hx of tobacco abuse. Per family, pt had fall prior to admit.  P:   -stat CXR - clear  -ABG -nicotine patch -add BD   CARDIOVASCULAR A: Atypical chest pain in setting of cocaine abuse.  Normal LV function on echo. Trop neg.   Now reported fall prior to admit, fell on L side.  P:  -anxiolytics, pain meds as needed -monitor on tele -Cardiology signed off   RENAL A:  Hyponatremia - mild  P:   -monitor renal fx, urine outpt, electrolytes -maintain volume status in setting of DT's -f/u chem   GASTROINTESTINAL A:  GI bleed >> likely upper in setting of ETOH. No further bleeding.  Hgb stable.  P:   -Continue protonix bid -NPO -GI following signed off, no EGD planned -thiamine, folic acid, dextrose in IV fluid -consider empiric addition octreotide if decompensates  HEMATOLOGIC A:  GI bleed. P:  -f/u CBC -transfuse for Hb < 7 or bleeding -SCD for DVT prevention  INFECTIOUS A:  No evidence for infection. P:   -monitor clinically  ENDOCRINE A:  No issues.   P:   -monitor blood sugar on BMET  NEUROLOGIC A:  Alcohol withdrawal. Hx of seizure disorder since childhood >> not on medications as outpt. ??seizure activity seen by family prior to fall.     P:   -precedex  added 9/12 -prn ativan -consider EEG, dilantin if rpt seizure   I have personally obtained a history, examined the patient, evaluated laboratory and imaging results, formulated the assessment and plan and placed orders. CRITICAL CARE: The patient is critically ill with multiple organ systems failure and requires high complexity decision making for assessment and support, frequent evaluation and titration of therapies, application of advanced  monitoring technologies and extensive interpretation of multiple databases. Critical Care Time devoted to patient care services described in this note is 31 minutes.    Danford Bad, NP 07/30/2013  2:37 PM Pager: [phone] or [phone]  *Care during the described time interval was provided by me and/or other providers on the critical care team. I have reviewed this patient's available  data, including medical history, events of note, physical examination and test results as part of my evaluation.  ALVA,RAKESH V.

## 2013-07-31 ENCOUNTER — Inpatient Hospital Stay (HOSPITAL_COMMUNITY): Payer: Medicaid Other

## 2013-07-31 LAB — BASIC METABOLIC PANEL
BUN: 8 mg/dL (ref 6–23)
CO2: 18 mEq/L — ABNORMAL LOW (ref 19–32)
Calcium: 8.5 mg/dL (ref 8.4–10.5)
Chloride: 101 mEq/L (ref 96–112)
Creatinine, Ser: 0.77 mg/dL (ref 0.50–1.35)
GFR calc Af Amer: >90 mL/min (ref >90)
GFR calc non Af Amer: >90 mL/min (ref >90)
Glucose, Bld: 94 mg/dL (ref 70–99)
Potassium: 3.9 mEq/L (ref 3.5–5.1)
Sodium: 137 mEq/L (ref 135–145)

## 2013-07-31 LAB — CBC
HCT: 45.8 % (ref 39.0–52.0)
Hemoglobin: 16.9 g/dL (ref 13.0–17.0)
MCH: 34.4 pg — ABNORMAL HIGH (ref 26.0–34.0)
MCHC: 36.9 g/dL — ABNORMAL HIGH (ref 30.0–36.0)
MCV: 93.3 fL (ref 78.0–100.0)
Platelets: 185 10*3/uL (ref 150–400)
RBC: 4.91 MIL/uL (ref 4.22–5.81)
RDW: 12.2 % (ref 11.5–15.5)
WBC: 8.1 10*3/uL (ref 4.0–10.5)

## 2013-07-31 MED ORDER — LORAZEPAM 2 MG/ML IJ SOLN
1.0000 mg | INTRAMUSCULAR | Status: DC | PRN
  Administered 2013-07-31: 2 mg via INTRAVENOUS
  Administered 2013-07-31: 4 mg via INTRAVENOUS
  Administered 2013-07-31: 2 mg via INTRAVENOUS
  Administered 2013-07-31: 0.5 mg via INTRAVENOUS
  Administered 2013-07-31: 1 mg via INTRAVENOUS
  Administered 2013-07-31: 1.5 mg via INTRAVENOUS
  Administered 2013-08-01 (×3): 4 mg via INTRAVENOUS
  Administered 2013-08-01 (×2): 2 mg via INTRAVENOUS
  Administered 2013-08-02 (×2): 4 mg via INTRAVENOUS
  Filled 2013-07-31 (×2): qty 2
  Filled 2013-07-31 (×4): qty 1
  Filled 2013-07-31: qty 2
  Filled 2013-07-31: qty 1
  Filled 2013-07-31 (×3): qty 2

## 2013-07-31 MED ORDER — LORAZEPAM 1 MG PO TABS: 1.0000 mg | ORAL_TABLET | ORAL | Status: DC | PRN

## 2013-07-31 MED ORDER — LORAZEPAM 2 MG/ML IJ SOLN
1.0000 mg | Freq: Four times a day (QID) | INTRAMUSCULAR | Status: DC | PRN
  Filled 2013-07-31: qty 1

## 2013-07-31 MED ORDER — FOLIC ACID 5 MG/ML IJ SOLN
1.0000 mg | Freq: Every day | INTRAMUSCULAR | Status: DC
  Administered 2013-07-31: 1 mg via INTRAVENOUS
  Filled 2013-07-31 (×2): qty 0.2

## 2013-07-31 NOTE — Progress Notes (Signed)
Have been able to wean off patient's Precedex gtt this afternoon, and pt has only required 1.5mg  Ativan in the past 5 hours for comfort.  Pt is much more alert with bouts of sitting upright in bed suddenly upon awakening, buthe is able to be reoriented fairly easily.  Pt is drowsy, and still drifts off to sleep easily but certainly more arousable than earlier this morning.  Bilat wrist restraints removed at 1530 as pt was felt to be safe and cooperative.  Pt brushed teeth with assistance, and had very clear conversation with RN about work and family.  Pt c/o moderate to severe pain in his left chest, which was reported to Dirk Dress, NP, CCM, since no current orders for pain meds on his profile.  Around 1545, observed tight flexion of both pt's hands with slight tremors for which he stated, "my hands are so tight", but no hallucinatory behaviors observed.  Ativan 0.5mg  IV given to pt for comfort.  Will continue to monitor pt.

## 2013-07-31 NOTE — Progress Notes (Signed)
Patient ID: Spencer Mendoza, male   DOB: 1961-07-23, 52 y.o.   MRN: [id number] Family Medicine Teaching Service Follow up Note:  52 yo male with h/o epilepsy and EtOH abuse who was admitted for chest pain. Developed DT's and was transferred to [facility] care for DT management.  Currently on precedex drip at 2 mcg/kg/hr, ativan (getting 4 mg q2 hours), and NS fluids. Now calm and sleeping per RN, though if they try to turn down his precedex or the time between ativan is too long he becomes agitated. On exam:  Filed Vitals:   07/31/13 0726 07/31/13 0800 07/31/13 0900 07/31/13 1012  BP:  203/85 178/97   Pulse:  35 36   Temp: 97.2 F (36.2 C)     TempSrc: Oral     Resp:  11 11   Height:      Weight:      SpO2:  100% 99% 99%   General: Sleeping comfortably CV: rrr, S1S2, no murmur appreciated Pulm: clear to auscultation bilaterally on anterior exam  A/P:  - continue DT management per CCM's management. Greatly appreciate their management of our patient.  - will gladly resume care once patient deemed appropriate for transfer.   Marikay Alar, PGY-2  Family Medicine Resident

## 2013-07-31 NOTE — Progress Notes (Signed)
Clinical Child psychotherapist (CSW) received consult via handoff for substance abuse assessment. Per chart review and RN patient is not alert and oriented and is heavily sedated. Patient cannot be appropriately assessed at this time.  Jetta Lout, LCSWA Weekend CSW [phone]

## 2013-07-31 NOTE — Progress Notes (Signed)
PT Cancellation Note  Patient Details Name: Spencer Mendoza MRN: [id number] DOB: 1961-10-15   Cancelled Treatment:    Reason Eval/Treat Not Completed: Medical issues which prohibited therapy (Pt appears to be in active withdrawal) Spoke with nsg, will hold PT at this time.   Fabio Asa 07/31/2013, 10:14 AM

## 2013-07-31 NOTE — Progress Notes (Signed)
PULMONARY  / CRITICAL CARE MEDICINE  Name: Spencer Mendoza MRN: [id number] DOB: [birth year]    ADMISSION DATE:  07/28/2013 CONSULTATION DATE:  07/29/2013  REFERRING MD :  Mauricio Po  CHIEF COMPLAINT:  Altered mental status  BRIEF PATIENT DESCRIPTION:  52 yo male smoker with hx of ETOH presented with chest pain, n/v with hematemesis and melena.  UDS positive for opiates, cocaine, THC on admit.  Developed DT's and transfer to ICU.  PCCM assumed care in ICU.  SIGNIFICANT EVENTS: 9/11 Admit to tele 9/12 Cardiology, GI consulted; Develop DT's >> transfer to ICU and PCCM assume care 9/13 "coughing fit", desat, requiring NRB  STUDIES:  9/12 Echo >>EF 60-65%, mild mitral regurg, systolic function normal  9/12 Abd u/s >>negative   LINES / TUBES: PIV  CULTURES: None  ANTIBIOTICS: None  SUBJECTIVE:  Intermittent severe agitation.  Difficult to manage.   Required high dose precededx  VITAL SIGNS: Temp:  [96.1 F (35.6 C)-97.8 F (36.6 C)] 97.2 F (36.2 C) (09/14 0726) Pulse Rate:  [34-84] 36 (09/14 0900) Resp:  [8-35] 11 (09/14 0900) BP: (109-203)/(71-99) 178/97 mmHg (09/14 0900) SpO2:  [84 %-100 %] 99 % (09/14 1012) FiO2 (%):  [35 %-50 %] 50 % (09/13 1430) HEMODYNAMICS:   VENTILATOR SETTINGS: Vent Mode:  [-]  FiO2 (%):  [35 %-50 %] 50 % INTAKE / OUTPUT: Intake/Output     09/13 0701 - 09/14 0700 09/14 0701 - 09/15 0700   I.V. (mL/kg) 3607.6 (46.1)    Total Intake(mL/kg) 3607.6 (46.1)    Urine (mL/kg/hr) 2800 (1.5) 300 (1)   Total Output 2800 300   Net +807.6 -300        Urine Occurrence 1 x      PHYSICAL EXAMINATION: General:  Ill appearing, thin man, NAD Neuro:  Sedated, RASS -2, severely agitated when awake, hitting at staff, MAE  HEENT:  OP moist, no lesions Cardiovascular:  s1s2 rrr, brady 50's Lungs:  Scattered rhonchi, diminished L, resps even, non labored  Abdomen:  Soft, NT, hypoactive BS Musculoskeletal:  No deformities, no edema Skin:  Multiple tattoos,  no rash  LABS:  CBC Recent Labs     07/29/13  0825  07/30/13  0441  07/31/13  0500  WBC  8.2  6.0  8.1  HGB  15.7  14.5  16.9  HCT  43.7  41.5  45.8  PLT  181  158  185   Coag's Recent Labs     07/29/13  0040  INR  0.93   BMET Recent Labs     07/29/13  0825  07/30/13  0441  07/31/13  0500  NA  134*  134*  137  K  3.5  3.4*  3.9  CL  99  101  101  CO2  24  20  18*  BUN  7  8  8   CREATININE  0.93  0.82  0.77  GLUCOSE  77  68*  94   Electrolytes Recent Labs     07/29/13  0825  07/30/13  0441  07/30/13  1723  07/31/13  0500  CALCIUM  8.8  8.6   --   8.5  MG   --    --   1.6   --    Sepsis Markers No results found for this basename: LACTICACIDVEN, PROCALCITON, O2SATVEN,  in the last 72 hours ABG Recent Labs     07/30/13  1532  PHART  7.334*  PCO2ART  34.8*  PO2ART  127.0*  Liver Enzymes Recent Labs     07/29/13  0040  07/29/13  0825  AST  38*  36  ALT  25  25  ALKPHOS  69  68  BILITOT  1.4*  1.4*  ALBUMIN  3.6  3.6   Cardiac Enzymes Recent Labs     07/28/13  1931  07/29/13  0040  07/29/13  0825  TROPONINI  <0.30  <0.30  <0.30   Glucose Recent Labs     07/30/13  1448  GLUCAP  71    Imaging US Abdomen Complete  07/30/2013   CLINICAL DATA:  Nausea and vomiting.  EXAM: ABDOMEN ULTRASOUND  COMPARISON:  None.  FINDINGS: Gallbladder  No gallstones or wall thickening. Negative sonographic Murphy's sign.  Common bile duct  Diameter: 3mm  Liver  No focal lesion identified. Within normal limits in parenchymal echogenicity.  IVC  No abnormality visualized.  Pancreas  Limited visualization, without abnormality detected on  Spleen  Although the spleen measures large at 14 cm maximal span, it does not appear thickened at 4 cm maximal thickness.  Right Kidney  Length: 11 cm Echogenicity within normal limits. No mass or hydronephrosis visualized.  Left Kidney  Length: 11 cm Echogenicity within normal limits. No mass or hydronephrosis visualized.   Abdominal aorta  No aneurysm visualized.  IMPRESSION: Negative abdominal ultrasound.   Electronically Signed   By: Tiburcio Pea   On: 07/30/2013 02:56   Dg Chest Port 1 View  07/31/2013   *RADIOLOGY REPORT*  Clinical Data: Respiratory failure  PORTABLE CHEST - 1 VIEW  Comparison: Prior radiograph from 07/30/2013  Findings: Cardiac and mediastinal silhouettes are stable in size and contour, and remain within normal limits.  Current examination has been performed with a similar degree of lung inflation. There is slightly increased patchy opacity at the right lung base as compared to the prior exam, which may represent atelectasis or infiltrate.  There is improved aeration at the left lung base.   No pulmonary edema or pleural effusion.  No pneumothorax.  Osseous structures are unchanged.  IMPRESSION:  1. Increased patchy opacity at the mid right lung base, which may represent atelectasis or infiltrate. 2. Slightly improved aeration of the left lung base   Original Report Authenticated By: Rise Mu, M.D.   Dg Chest Port 1 View  07/30/2013   *RADIOLOGY REPORT*  Clinical Data: Hypoxia, fall  PORTABLE CHEST - 1 VIEW  Comparison: 07/28/2013  Findings: Heart size and vascular pattern are normal.  Lungs are clear except for mild left lower lobe atelectasis.  IMPRESSION: No significant acute findings.   Original Report Authenticated By: Esperanza Heir, M.D.    ASSESSMENT / PLAN:  PULMONARY A: At risk for aspiration. Hx of tobacco abuse. Per family, pt had fall prior to admit.  P:   -nicotine patch -BD  -monitor resp status - tenuous with significant need for sedation   NEUROLOGIC A:  Alcohol withdrawal. Hx of seizure disorder since childhood >> not on medications as outpt. ??seizure activity seen by family prior to fall.     P:   -cont precedex, max @ 0.8 -CIWA, prn ativan for breakthrough -consider EEG, dilantin if rpt seizure  CARDIOVASCULAR A: Atypical chest pain in setting of  cocaine abuse.  Normal LV function on echo. Trop neg.   Now reported fall prior to admit, fell on L side.  P:  -anxiolytics, pain meds as needed -monitor on tele -Cardiology signed off   RENAL A:  Hyponatremia -  mild  P:   -monitor renal fx, urine outpt, electrolytes -maintain volume status in setting of DT's -f/u chem   GASTROINTESTINAL A:  GI bleed >> likely upper in setting of ETOH. No further bleeding.  Hgb stable.  P:   -Continue protonix bid -NPO -GI following signed off, no EGD planned -thiamine, folic acid, dextrose in IV fluid   HEMATOLOGIC A:  GI bleed. P:  -f/u CBC -transfuse for Hb < 7 or bleeding -SCD for DVT prevention  INFECTIOUS A:  No evidence for infection. P:   -monitor clinically  ENDOCRINE A:  No issues.   P:   -monitor blood sugar on BMET   GLOBAL - Updated sister & father at bedside who did not seem to realise that his ETOH abuse was so bad. I am not sure they are aware of other substance abuse  I have personally obtained a history, examined the patient, evaluated laboratory and imaging results, formulated the assessment and plan and placed orders. CRITICAL CARE: The patient is critically ill with multiple organ systems failure and requires high complexity decision making for assessment and support, frequent evaluation and titration of therapies, application of advanced monitoring technologies and extensive interpretation of multiple databases. Critical Care Time devoted to patient care services described in this note is 19m minutes.   Cyril Mourning MD. Tonny Bollman. [facility] Pager [phone] If no response call [phone]

## 2013-08-01 LAB — CBC
HCT: 41.9 % (ref 39.0–52.0)
Hemoglobin: 15 g/dL (ref 13.0–17.0)
MCH: 33.3 pg (ref 26.0–34.0)
MCHC: 35.8 g/dL (ref 30.0–36.0)
MCV: 93.1 fL (ref 78.0–100.0)
Platelets: 178 10*3/uL (ref 150–400)
RBC: 4.5 MIL/uL (ref 4.22–5.81)
RDW: 12.4 % (ref 11.5–15.5)
WBC: 7.1 10*3/uL (ref 4.0–10.5)

## 2013-08-01 LAB — BASIC METABOLIC PANEL
BUN: 4 mg/dL — ABNORMAL LOW (ref 6–23)
CO2: 19 mEq/L (ref 19–32)
Calcium: 8.4 mg/dL (ref 8.4–10.5)
Chloride: 101 mEq/L (ref 96–112)
Creatinine, Ser: 0.8 mg/dL (ref 0.50–1.35)
GFR calc Af Amer: >90 mL/min (ref >90)
GFR calc non Af Amer: >90 mL/min (ref >90)
Glucose, Bld: 100 mg/dL — ABNORMAL HIGH (ref 70–99)
Potassium: 3.4 mEq/L — ABNORMAL LOW (ref 3.5–5.1)
Sodium: 137 mEq/L (ref 135–145)

## 2013-08-01 MED ORDER — IPRATROPIUM BROMIDE 0.02 % IN SOLN: 0.5000 mg | RESPIRATORY_TRACT | Status: DC | PRN

## 2013-08-01 MED ORDER — FOLIC ACID 1 MG PO TABS
1.0000 mg | ORAL_TABLET | Freq: Every day | ORAL | Status: DC
  Administered 2013-08-02 – 2013-08-03 (×2): 1 mg via ORAL
  Filled 2013-08-01 (×3): qty 1

## 2013-08-01 MED ORDER — FOLIC ACID 5 MG/ML IJ SOLN
1.0000 mg | Freq: Every day | INTRAMUSCULAR | Status: DC
  Administered 2013-08-01: 1 mg via INTRAVENOUS
  Filled 2013-08-01 (×2): qty 0.2

## 2013-08-01 MED ORDER — ALBUTEROL SULFATE (5 MG/ML) 0.5% IN NEBU: 2.5000 mg | INHALATION_SOLUTION | RESPIRATORY_TRACT | Status: DC | PRN

## 2013-08-01 MED ORDER — PANTOPRAZOLE SODIUM 40 MG PO TBEC
40.0000 mg | DELAYED_RELEASE_TABLET | Freq: Every day | ORAL | Status: DC
  Administered 2013-08-02 – 2013-08-03 (×2): 40 mg via ORAL
  Filled 2013-08-01 (×2): qty 1

## 2013-08-01 MED ORDER — FENTANYL CITRATE 0.05 MG/ML IJ SOLN
25.0000 ug | INTRAMUSCULAR | Status: DC | PRN
  Administered 2013-08-01 – 2013-08-02 (×9): 50 ug via INTRAVENOUS
  Filled 2013-08-01 (×9): qty 2

## 2013-08-01 MED ORDER — TRAMADOL HCL 50 MG PO TABS
50.0000 mg | ORAL_TABLET | Freq: Four times a day (QID) | ORAL | Status: DC | PRN
  Administered 2013-08-01 – 2013-08-02 (×4): 50 mg via ORAL
  Filled 2013-08-01 (×5): qty 1

## 2013-08-01 NOTE — Progress Notes (Signed)
Dr Craige Cotta updated at bedside. MD updated CIWA 7-8 range, tremor evident. PRN ativan x1 this AM, precedex gtt off. MD ok to progress diet to full liquid and advance activity as pt tolerates. Updated pt more alert, oriented x3. MD updated pt complains of pain to left side of chest to back. MD assessed pt at bedside. Orders received for PRN pain medication. Will continue to monitor. Koren Bound

## 2013-08-01 NOTE — Plan of Care (Signed)
Problem: Problem: Sedation Progression Goal: ABLE TO ACHIEVE DESIRED RASS SCORE Outcome: Progressing Desired RASS managed with PRN ativan and precedex infusion. Spencer Mendoza

## 2013-08-01 NOTE — Progress Notes (Signed)
Pt sinus brady in the mid 40's to upper 50's while resting. CCM notified and okay with heart rate above 45, if drops below 45 I was instructed to turn precedex off. Pt currently comfortable and resting with HR SB at 48-50. SBP 140's-160's. Will continue to monitor, pt safety maintained.

## 2013-08-01 NOTE — Progress Notes (Signed)
PULMONARY  / CRITICAL CARE MEDICINE  Name: Spencer Mendoza MRN: [id number] DOB: 1961/08/04    ADMISSION DATE:  07/28/2013 CONSULTATION DATE:  07/29/2013  REFERRING MD :  Mauricio Po  CHIEF COMPLAINT:  Altered mental status  BRIEF PATIENT DESCRIPTION:  52 yo male smoker with hx of ETOH presented with chest pain, n/v with hematemesis and melena.  UDS positive for opiates, cocaine, THC on admit.  Developed DT's and transfer to ICU.  PCCM assumed care in ICU.  SIGNIFICANT EVENTS: 9/11 Admit to tele 9/12 Cardiology, GI consulted; Develop DT's >> transfer to ICU and PCCM assume care 9/13 "coughing fit", desat, requiring NRB, cardiology sign off, GI sign off 9/15 off precedex  STUDIES:  9/12 Echo >>EF 60-65%, mild mitral regurg, systolic function normal  9/12 Abd u/s >>negative   LINES / TUBES: PIV  CULTURES: None  ANTIBIOTICS: None  SUBJECTIVE:  Off precedex this AM.  Has cough with clear sputum.  C/o intermittent chest/upper abdomen discomfort.  VITAL SIGNS: Temp:  [97.1 F (36.2 C)-97.5 F (36.4 C)] 97.4 F (36.3 C) (09/15 0724) Pulse Rate:  [42-83] 83 (09/15 0800) Resp:  [9-23] 12 (09/15 0800) BP: (98-155)/(56-93) 126/82 mmHg (09/15 0800) SpO2:  [95 %-100 %] 96 % (09/15 0800) Room air  INTAKE / OUTPUT: Intake/Output     09/14 0701 - 09/15 0700 09/15 0701 - 09/16 0700   P.O. 120    I.V. (mL/kg) 3092 (39.5) 250 (3.2)   Total Intake(mL/kg) 3212 (41.1) 250 (3.2)   Urine (mL/kg/hr) 2250 (1.2) 700 (2.9)   Total Output 2250 700   Net +962 -450        Urine Occurrence 1 x      PHYSICAL EXAMINATION: General: No distress Neuro: RASS 0, follows commands, oriented to person/place HEENT: no sinus tenderness, poor dentition Cardiovascular: regular, no murmur Lungs: no wheeze Abdomen:  Soft, non tender Musculoskeletal: no edema Skin:  Multiple tattoos, no rash  LABS:  CBC Recent Labs     07/30/13  0441  07/31/13  0500  08/01/13  0420  WBC  6.0  8.1  7.1  HGB   14.5  16.9  15.0  HCT  41.5  45.8  41.9  PLT  158  185  178   Coag's No results found for this basename: APTT, INR,  in the last 72 hours  BMET Recent Labs     07/30/13  0441  07/31/13  0500  08/01/13  0420  NA  134*  137  137  K  3.4*  3.9  3.4*  CL  101  101  101  CO2  20  18*  19  BUN  8  8  4*  CREATININE  0.82  0.77  0.80  GLUCOSE  68*  94  100*   Electrolytes Recent Labs     07/30/13  0441  07/30/13  1723  07/31/13  0500  08/01/13  0420  CALCIUM  8.6   --   8.5  8.4  MG   --   1.6   --    --    Sepsis Markers No results found for this basename: LACTICACIDVEN, PROCALCITON, O2SATVEN,  in the last 72 hours  ABG Recent Labs     07/30/13  1532  PHART  7.334*  PCO2ART  34.8*  PO2ART  127.0*   Liver Enzymes No results found for this basename: AST, ALT, ALKPHOS, BILITOT, ALBUMIN,  in the last 72 hours  Cardiac Enzymes No results found for this  basename: TROPONINI, PROBNP,  in the last 72 hours  Glucose Recent Labs     07/30/13  1448  GLUCAP  71    Imaging Dg Chest Port 1 View  07/31/2013   *RADIOLOGY REPORT*  Clinical Data: Respiratory failure  PORTABLE CHEST - 1 VIEW  Comparison: Prior radiograph from 07/30/2013  Findings: Cardiac and mediastinal silhouettes are stable in size and contour, and remain within normal limits.  Current examination has been performed with a similar degree of lung inflation. There is slightly increased patchy opacity at the right lung base as compared to the prior exam, which may represent atelectasis or infiltrate.  There is improved aeration at the left lung base.   No pulmonary edema or pleural effusion.  No pneumothorax.  Osseous structures are unchanged.  IMPRESSION:  1. Increased patchy opacity at the mid right lung base, which may represent atelectasis or infiltrate. 2. Slightly improved aeration of the left lung base   Original Report Authenticated By: Rise Mu, M.D.   Dg Chest Port 1 View  07/30/2013    *RADIOLOGY REPORT*  Clinical Data: Hypoxia, fall  PORTABLE CHEST - 1 VIEW  Comparison: 07/28/2013  Findings: Heart size and vascular pattern are normal.  Lungs are clear except for mild left lower lobe atelectasis.  IMPRESSION: No significant acute findings.   Original Report Authenticated By: Esperanza Heir, M.D.    ASSESSMENT / PLAN:  PULMONARY A: Atelectasis. Hx of tobacco abuse..  P:   -continue nicotine patch -f/u CXR 9/16 -prn BD's  NEUROLOGIC A:  Alcohol withdrawal. Lt sided chest pain >> likely related to fall prior to admit. P:   -prn ativan for CIWA > 8 >> hopefully will remain of precedex -prn tramadol, fentanyl  CARDIOVASCULAR A: Atypical chest pain in setting of cocaine abuse >> cardiology signed off. P:  -anxiolytics, pain meds as needed -monitor on tele  RENAL A:  Hyponatremia - mild >> improved. P:   -monitor renal fx, urine outpt, electrolytes  GASTROINTESTINAL A:  GI bleed >> likely upper in setting of ETOH. No further bleeding.  Hgb stable.  GI signed off. P:   -Change protonix to daily -change to full liquid diet and advance as tolerated  -may need speech therapy assessment if he has difficulty swollowing -thiamine, folic acid  HEMATOLOGIC A:  GI bleed >> no further bleeding. P:  -f/u CBC -transfuse for Hb < 7 or bleeding -SCD for DVT prevention  INFECTIOUS A:  No evidence for infection. P:   -monitor clinically  ENDOCRINE A:  No issues.   P:   -monitor blood sugar on BMET   CC time 35 minutes.  Coralyn Helling, MD [facility] Pulmonary/Critical Care 08/01/2013, 10:17 AM Pager:  [phone] After 3pm call: [phone]

## 2013-08-01 NOTE — Progress Notes (Signed)
Patient ID: Spencer Mendoza, male   DOB: [birth year], 52 y.o.   MRN: [id number] Family Medicine Teaching Service Follow up Note:  52 yo male with h/o epilepsy and EtOH abuse who was admitted for chest pain. Developed DT's and was transferred to [facility] care for DT management.  Precedex drip stopped this morning, ativan (getting 4 mg q2 hours), and NS fluids.  Complaining of pain but will defer to CCM pain management at this time. On exam:  Filed Vitals:   08/01/13 0800 08/01/13 1000 08/01/13 1127 08/01/13 1200  BP: 126/82 125/83  149/93  Pulse: 83 65  87  Temp:   97.5 F (36.4 C)   TempSrc:   Oral   Resp: 12 18  22   Height:      Weight:      SpO2: 96% 96%  97%   General: upright in bed complaining of continued pain CV: rrr, S1S2, no murmur appreciated Pulm: clear to auscultation bilaterally on anterior exam  A/P:  - continue DT management per CCM's management. Greatly appreciate their management of our patient.  - will gladly resume care once patient deemed appropriate for transfer.   Tawni Carnes, MD 08/01/2013, 1:41 PM PGY-1, [facility] Health Family Medicine FPTS Intern Pager: [phone], text pages welcome

## 2013-08-01 NOTE — Progress Notes (Signed)
Dr Craige Cotta updated pt with coughing spells with exertion. Episode x2 this shift, once while sitting on side of bed, the second post ambulation to chair. Small amount of blood tinge sputum with coughing, otherwise clear/tan. Pt remains with left chest pain. precedex gtt infusing. Will continue to monitor. Koren Bound

## 2013-08-01 NOTE — Progress Notes (Signed)
Dr Craige Cotta updated AM labs (K 3.4) will continue to monitor, updated SR with peaked T wave. Will continue to monitor Koren Bound

## 2013-08-01 NOTE — Progress Notes (Signed)
Dr Craige Cotta updated on pt status. Updated PRN fentanyl IV x2, and PRN ultram. Pt still with complaints of headache and pain to left chest. Per pt and girlfriend, pt with recent fall prior to admission, unable to get clear understanding of where pt landed with fall. Possibly right shoulder. Pt restless and anxious, constantly moving in bed. Updated on CIWA assessment 7-8 range, PRN ativan given x2 this shift, 2mg  IV. MD ok to resume precedex gtt as needed for anxiety. Will continue to monitor. Koren Bound

## 2013-08-01 NOTE — Progress Notes (Signed)
I discussed with Dr Sonnenberg.  I agree with their plans documented in their progress note for today.  

## 2013-08-02 ENCOUNTER — Inpatient Hospital Stay (HOSPITAL_COMMUNITY): Payer: 59

## 2013-08-02 ENCOUNTER — Inpatient Hospital Stay (HOSPITAL_COMMUNITY): Payer: Medicaid Other

## 2013-08-02 LAB — COMPREHENSIVE METABOLIC PANEL
ALT: 14 U/L (ref 0–53)
AST: 19 U/L (ref 0–37)
Albumin: 3.3 g/dL — ABNORMAL LOW (ref 3.5–5.2)
Alkaline Phosphatase: 68 U/L (ref 39–117)
BUN: 3 mg/dL — ABNORMAL LOW (ref 6–23)
CO2: 24 mEq/L (ref 19–32)
Calcium: 8.8 mg/dL (ref 8.4–10.5)
Chloride: 97 mEq/L (ref 96–112)
Creatinine, Ser: 0.76 mg/dL (ref 0.50–1.35)
GFR calc Af Amer: >90 mL/min (ref >90)
GFR calc non Af Amer: >90 mL/min (ref >90)
Glucose, Bld: 72 mg/dL (ref 70–99)
Potassium: 2.8 mEq/L — ABNORMAL LOW (ref 3.5–5.1)
Sodium: 137 mEq/L (ref 135–145)
Total Bilirubin: 0.9 mg/dL (ref 0.3–1.2)
Total Protein: 6.1 g/dL (ref 6.0–8.3)

## 2013-08-02 LAB — POCT I-STAT 3, ART BLOOD GAS (G3+)
Acid-base deficit: 1 mmol/L (ref 0.0–2.0)
Bicarbonate: 23.8 mEq/L (ref 20.0–24.0)
O2 Saturation: 94 %
TCO2: 25 mmol/L (ref 0–100)
pCO2 arterial: 38 mmHg (ref 35.0–45.0)
pH, Arterial: 7.405 (ref 7.350–7.450)
pO2, Arterial: 71 mmHg — ABNORMAL LOW (ref 80.0–100.0)

## 2013-08-02 LAB — CBC
HCT: 39.7 % (ref 39.0–52.0)
Hemoglobin: 14.4 g/dL (ref 13.0–17.0)
MCH: 33.6 pg (ref 26.0–34.0)
MCHC: 36.3 g/dL — ABNORMAL HIGH (ref 30.0–36.0)
MCV: 92.8 fL (ref 78.0–100.0)
Platelets: 176 10*3/uL (ref 150–400)
RBC: 4.28 MIL/uL (ref 4.22–5.81)
RDW: 12.5 % (ref 11.5–15.5)
WBC: 9.2 10*3/uL (ref 4.0–10.5)

## 2013-08-02 LAB — MAGNESIUM: Magnesium: 1.3 mg/dL — ABNORMAL LOW (ref 1.5–2.5)

## 2013-08-02 LAB — PHOSPHORUS: Phosphorus: 2.6 mg/dL (ref 2.3–4.6)

## 2013-08-02 MED ORDER — LORAZEPAM 2 MG/ML IJ SOLN
1.0000 mg | Freq: Four times a day (QID) | INTRAMUSCULAR | Status: DC | PRN
  Administered 2013-08-02 – 2013-08-03 (×3): 2 mg via INTRAVENOUS
  Filled 2013-08-02 (×3): qty 1

## 2013-08-02 MED ORDER — HYDROCODONE-ACETAMINOPHEN 7.5-325 MG PO TABS
1.0000 | ORAL_TABLET | Freq: Four times a day (QID) | ORAL | Status: DC | PRN
  Administered 2013-08-02 – 2013-08-03 (×3): 1 via ORAL
  Filled 2013-08-02 (×3): qty 1

## 2013-08-02 MED ORDER — MAGNESIUM OXIDE 400 (241.3 MG) MG PO TABS
400.0000 mg | ORAL_TABLET | Freq: Two times a day (BID) | ORAL | Status: DC
  Administered 2013-08-02 – 2013-08-03 (×3): 400 mg via ORAL
  Filled 2013-08-02 (×4): qty 1

## 2013-08-02 MED ORDER — FENTANYL CITRATE 0.05 MG/ML IJ SOLN
25.0000 ug | INTRAMUSCULAR | Status: DC | PRN
  Administered 2013-08-03: 25 ug via INTRAVENOUS
  Filled 2013-08-02: qty 2

## 2013-08-02 MED ORDER — NICOTINE 14 MG/24HR TD PT24
14.0000 mg | MEDICATED_PATCH | Freq: Every day | TRANSDERMAL | Status: DC
  Administered 2013-08-02 – 2013-08-03 (×2): 14 mg via TRANSDERMAL
  Filled 2013-08-02 (×2): qty 1

## 2013-08-02 MED ORDER — SODIUM CHLORIDE 0.9 % IV SOLN: INTRAVENOUS | Status: DC | PRN

## 2013-08-02 MED ORDER — POTASSIUM CHLORIDE CRYS ER 20 MEQ PO TBCR
40.0000 meq | EXTENDED_RELEASE_TABLET | ORAL | Status: AC
  Administered 2013-08-02 (×3): 40 meq via ORAL
  Filled 2013-08-02 (×3): qty 2

## 2013-08-02 NOTE — Progress Notes (Signed)
Patient ID: Spencer Mendoza, male   DOB: 1961-07-16, 52 y.o.   MRN: [id number] Family Medicine Teaching Service Follow up Note:  52 yo male with h/o epilepsy and EtOH abuse who was admitted for chest pain. Developed DT's and was transferred to [facility] care for DT management.  Precedex drip stopped this morning, ativan (getting 4 mg q2 hours), and NS fluids.  Complaining of pain but will defer to CCM pain management at this time. On exam:  Filed Vitals:   08/02/13 0600 08/02/13 0700 08/02/13 0727 08/02/13 0800  BP: 134/94 134/71  150/90  Pulse: 68 59  78  Temp:   97.9 F (36.6 C)   TempSrc:   Oral   Resp: 17 14  21   Height:      Weight:      SpO2: 94% 95%  92%   General: lying in bed, alert and oriented CV: rrr, S1S2, no murmur appreciated Pulm: CTAB, effort normal  A/P:  - continue DT management per CCM's management. Precedex off as of midnight 9/16. Greatly appreciate their management of our patient.  - will resume care tomorrow 9/17  Tawni Carnes, MD 08/02/2013, 9:32 AM PGY-1, [facility] Health Family Medicine FPTS Intern Pager: [phone], text pages welcome

## 2013-08-02 NOTE — Progress Notes (Signed)
PULMONARY  / CRITICAL CARE MEDICINE  Name: Spencer Mendoza MRN: [id number] DOB: 16-Feb-1961    ADMISSION DATE:  07/28/2013 CONSULTATION DATE:  07/29/2013  REFERRING MD :  Mauricio Po  CHIEF COMPLAINT:  Altered mental status  BRIEF PATIENT DESCRIPTION:  52 yo male smoker with hx of ETOH presented with chest pain, n/v with hematemesis and melena.  UDS positive for opiates, cocaine, THC on admit.  Developed DT's and transfer to ICU.  PCCM assumed care in ICU.  SIGNIFICANT EVENTS: 9/11 Admit to tele 9/12 Cardiology, GI consulted; Develop DT's >> transfer to ICU and PCCM assume care 9/13 "coughing fit", desat, requiring NRB, cardiology sign off, GI sign off 9/15 off precedex 9/16 Transfer to telemetry  STUDIES:  9/12 Echo >>EF 60-65%, mild mitral regurg, systolic function normal  9/12 Abd u/s >>negative   LINES / TUBES: PIV  CULTURES: None  ANTIBIOTICS: None  SUBJECTIVE:  Intermittently c/o chest discomfort when he moves >> not having now.  He is confused how 4 to 5 beers per day can cause him to be an alcoholic.  Had episode of cough with blood in sputum last night.  VITAL SIGNS: Temp:  [97.3 F (36.3 C)-98.5 F (36.9 C)] 97.9 F (36.6 C) (09/16 0727) Pulse Rate:  [45-87] 78 (09/16 0800) Resp:  [10-27] 21 (09/16 0800) BP: (125-171)/(69-103) 150/90 mmHg (09/16 0800) SpO2:  [91 %-100 %] 92 % (09/16 0800) FiO2 (%):  [50 %] 50 % (09/16 0400) 4 liters [place]  INTAKE / OUTPUT: Intake/Output     09/15 0701 - 09/16 0700 09/16 0701 - 09/17 0700   P.O. 400    I.V. (mL/kg) 1975.3 (25.3) 75 (1)   Total Intake(mL/kg) 2375.3 (30.4) 75 (1)   Urine (mL/kg/hr) 3675 (2)    Total Output 3675     Net -1299.7 +75        Urine Occurrence 2 x      PHYSICAL EXAMINATION: General: No distress, sitting in chair Neuro: RASS 0, follows commands, oriented to person/place HEENT: no sinus tenderness, poor dentition Cardiovascular: regular, no murmur Lungs: no wheeze Abdomen:  Soft, non  tender Musculoskeletal: no edema Skin:  Multiple tattoos, no rash  LABS:  CBC Recent Labs     07/31/13  0500  08/01/13  0420  08/02/13  0622  WBC  8.1  7.1  9.2  HGB  16.9  15.0  14.4  HCT  45.8  41.9  39.7  PLT  185  178  176    BMET Recent Labs     07/31/13  0500  08/01/13  0420  08/02/13  0622  NA  137  137  137  K  3.9  3.4*  2.8*  CL  101  101  97  CO2  18*  19  24  BUN  8  4*  3*  CREATININE  0.77  0.80  0.76  GLUCOSE  94  100*  72   Electrolytes Recent Labs     07/30/13  1723  07/31/13  0500  08/01/13  0420  08/02/13  0622  CALCIUM   --   8.5  8.4  8.8  MG  1.6   --    --   1.3*  PHOS   --    --    --   2.6   ABG Recent Labs     07/30/13  1532  08/02/13  0246  PHART  7.334*  7.405  PCO2ART  34.8*  38.0  PO2ART  127.0*  71.0*   Liver Enzymes Recent Labs     08/02/13  0622  AST  19  ALT  14  ALKPHOS  68  BILITOT  0.9  ALBUMIN  3.3*    Glucose Recent Labs     07/30/13  1448  GLUCAP  71    Imaging Dg Chest Port 1 View  08/02/2013   CLINICAL DATA:  Coughing episode with hypoxia  EXAM: PORTABLE CHEST - 1 VIEW  COMPARISON:  Chest x-ray 2 days prior  FINDINGS: No cardiomegaly or change in mediastinal contour. No acute infiltrate or edema. No effusion or pneumothorax. Mild, symmetric biapical pleural parenchymal changes.  IMPRESSION: Improved aeration compared to 2 days prior.  No infiltrate or edema.   Electronically Signed   By: Tiburcio Pea   On: 08/02/2013 06:11    ASSESSMENT / PLAN:  PULMONARY A: Atelectasis. Hx of tobacco abuse. ?Hemopytsis >> CXR 9/16 improved. P:   -wean nicotine patch as tolerated -prn BD's  NEUROLOGIC A:  Alcohol withdrawal >> off precedex 9/15. Lt sided chest pain >> likely musculoskeletal. P:   -prn ativan for CIWA > 8 -prn tramadol, fentanyl  CARDIOVASCULAR A: Atypical chest pain in setting of cocaine abuse >> cardiology signed off. P:  -anxiolytics, pain meds as needed -monitor on  tele  RENAL A:  Hyponatremia - mild >> improved. Hypokalemia. Hypomagnesemia. P:   -monitor renal fx, urine outpt, electrolytes  GASTROINTESTINAL A:  GI bleed >> likely upper in setting of ETOH. No further bleeding.  Hgb stable.  GI signed off. P:   -Changde protonix daily -change to regular diet -thiamine, folic acid, MVI  HEMATOLOGIC A:  GI bleed >> no further bleeding. P:  -f/u CBC -transfuse for Hb < 7 or bleeding -SCD for DVT prevention  INFECTIOUS A:  No evidence for infection. P:   -monitor clinically  ENDOCRINE A:  No issues.   P:   -monitor blood sugar on BMET   D/w FPTS resident.  Transfer to telemetry 9/16.  FPTS will assume care from 9/17 and PCCM sign off.  Coralyn Helling, MD [facility] Pulmonary/Critical Care 08/02/2013, 8:17 AM Pager:  [phone] After 3pm call: [phone]

## 2013-08-02 NOTE — Progress Notes (Signed)
Pt had a severe coughing episode which resulted in coughing up a bright red blood clot. Pt began to desat on 2 liters nasal cannula, pt placed on 6L [place] and O2 sats came up to 88-89%. Venti mask placed at 40% and sats came up to 90%. Venti mask currently at 50% FiO2 and sats are maintaining at 96%. CCM notified of hemoptysis and desaturation. Stat ABG and chest x-ray were ordered. Condom catheter was placed on pt r/t incontinent episodes throughout the night. Will continue to monitor.

## 2013-08-03 LAB — BASIC METABOLIC PANEL
BUN: 4 mg/dL — ABNORMAL LOW (ref 6–23)
CO2: 27 mEq/L (ref 19–32)
Calcium: 10.1 mg/dL (ref 8.4–10.5)
Chloride: 95 mEq/L — ABNORMAL LOW (ref 96–112)
Creatinine, Ser: 0.88 mg/dL (ref 0.50–1.35)
GFR calc Af Amer: >90 mL/min (ref >90)
GFR calc non Af Amer: >90 mL/min (ref >90)
Glucose, Bld: 89 mg/dL (ref 70–99)
Potassium: 3.6 mEq/L (ref 3.5–5.1)
Sodium: 137 mEq/L (ref 135–145)

## 2013-08-03 LAB — CBC
HCT: 42.2 % (ref 39.0–52.0)
Hemoglobin: 15.1 g/dL (ref 13.0–17.0)
MCH: 33.3 pg (ref 26.0–34.0)
MCHC: 35.8 g/dL (ref 30.0–36.0)
MCV: 93.2 fL (ref 78.0–100.0)
Platelets: 202 10*3/uL (ref 150–400)
RBC: 4.53 MIL/uL (ref 4.22–5.81)
RDW: 12.3 % (ref 11.5–15.5)
WBC: 6.7 10*3/uL (ref 4.0–10.5)

## 2013-08-03 LAB — MAGNESIUM: Magnesium: 1.8 mg/dL (ref 1.5–2.5)

## 2013-08-03 MED ORDER — POLYETHYLENE GLYCOL 3350 17 G PO PACK
17.0000 g | PACK | Freq: Every day | ORAL | Status: DC
  Administered 2013-08-03: 17 g via ORAL
  Filled 2013-08-03: qty 1

## 2013-08-03 MED ORDER — ACETAMINOPHEN 325 MG PO TABS: 650.0000 mg | ORAL_TABLET | Freq: Four times a day (QID) | ORAL | Status: DC | PRN

## 2013-08-03 NOTE — Care Management (Signed)
Case Manager did call the pt at home in reference to PCP- CM did give the pt the number/address to the [facility] for pt to call to make f/u appointment. Pt left AMA before CM was able to see. Gala Lewandowsky, RN,BSN [phone]

## 2013-08-03 NOTE — Progress Notes (Signed)
Pt standing in hallway stating "I'm leaving right now". Pt very unsteady on feet. Multiple failed attempts to talk patient into staying. Education provided to patient on need to stay in hospital. Pt refused. AMA paper signed. IV removed with tip intact. Pt provided with paper scrub set. Pt leaving by foot AMA. Levonne Spiller, RN

## 2013-08-03 NOTE — Progress Notes (Signed)
Family Medicine Teaching Service Daily Progress Note Intern Pager: [phone]  Patient name: Spencer Mendoza Medical record number: [id number] Date of birth: [birth year] Age: 52 y.o. Gender: male  Primary Care Provider: No PCP Per Patient Consultants: Cardiology, GI Code Status: Full  Pt Overview and Major Events to Date:  9/12: transferred to ICU/CCM with withdrawal and DTs, echo 9/13: precedex started 9/15: precedex stopped and restarted 9/16 transferred out of ICU to telemetry  Assessment and Plan: ETHAN CLAYBURN is a 52 y.o. male presenting with acute on chronic chest pain, Blood streaked to frank blood vomit and melana for 1 month. [name] PMH is significant for seizure disorder (childhood) and alcohol abuse. UDS positive for cocaine and THC.   # Left Chest Pain: Cardiology consulted in ED. They felt CP was atypical (chest wall, pressure/sharp). EKG with right axis deviation and no ST changes. Troponin neg x 3. Lipase normal, ruling out pancreatitis. VSS. Small concern for PE, with atypical chest pain, d-dimer nl <0.27. Well's score : 3.6% low risk. Heart score: 3.  - Appreciate cardiology consult: Echo showed normal LV function, EKG unchanged, troponin neg x 3, cardiology signed off - Was receiving tramadol, fentanyl, norco while in ICU  # Vomiting: initial concern for varices or Mallory-weiss tear (CXR no free air).  - GI consulted for Melana and hematemesis; we appreciate their recommendations and for examining this patient. No EGD at this time with no evidence of cirrhosis and stable Hgb. GI signed off. - Protonix IV BID  - Zofran  - Hgb stable at 15.1 - Most recent vomiting 1 episode last night, bilious.  # Constipation: has not had a recent BM, has been receiving fentanyl/norco - No stool softener ordered/given during hospital course so far - Miralax today  # Substance abuse / Alcohol Withdrawal & DTs: Patient admits to daily alcohol use and marijuana use. His UDS was positive  for cocaine and THC.  - Required precedex drip while in ICU in addition to ativan. No precedex for last 1.5 days - Continue CIWA protocol with ativan PRN.  - Last eval score was 8 at 1940 yesterday, last ativan at 2118  # Epilepsy: Childhood history of seizures. He was prescribed dilatin, but has not been taking for over 8 years. He states he has not had a seizure for >8 years.   FEN/GI: Heart healthy  Prophylaxis: SCD's  Disposition: pending clinical course  Subjective:  Patient says he is doing "okay".  He complains of only left chest wall pain, describes as a pressure and also sharp when it gets worse which primarily is when he works and is physically active. Rates it as 2/10 now, and has been better in the hospital since he has not been lifting anything. Appetite is still poor, but is trying to drink fluids. Had 1 episode of vomiting yesterday that he says was bilious, no blood noticed. Says he hasn't had a BM in a week while is unusual for him.  Objective: Temp:  [97.4 F (36.3 C)-99 F (37.2 C)] 97.4 F (36.3 C) (09/17 0523) Pulse Rate:  [64-105] 71 (09/17 0523) Resp:  [11-22] 16 (09/16 1737) BP: (129-172)/(72-110) 155/88 mmHg (09/17 0523) SpO2:  [90 %-98 %] 97 % (09/17 0523) Physical Exam: General: NAD, laying in bed HEENT: PERRL, EOMI Cardiovascular: RRR, normal heart sounds Chest: nontender to area of pain complaint (left anterolateral chest well below nipple), no skin lesions, no deformities Respiratory: CTAB, effort normal Abdomen: Soft, bowel sounds present. Non-tender. Dull to percussion  in LLQ. Extremities: no edema in legs, 2+ PT pulses bilaterally.  Laboratory:  Recent Labs Lab 08/01/13 0420 08/02/13 0622 08/03/13 0450  WBC 7.1 9.2 6.7  HGB 15.0 14.4 15.1  HCT 41.9 39.7 42.2  PLT 178 176 202    Recent Labs Lab 07/29/13 0040 07/29/13 0825  08/01/13 0420 08/02/13 0622 08/03/13 0450  NA 133* 134*  < > 137 137 137  K 3.4* 3.5  < > 3.4* 2.8* 3.6  CL  98 99  < > 101 97 95*  CO2 24 24  < > 19 24 27   BUN 7 7  < > 4* 3* 4*  CREATININE 0.92 0.93  < > 0.80 0.76 0.88  CALCIUM 9.0 8.8  < > 8.4 8.8 10.1  PROT 6.5 6.3  --   --  6.1  --   BILITOT 1.4* 1.4*  --   --  0.9  --   ALKPHOS 69 68  --   --  68  --   ALT 25 25  --   --  14  --   AST 38* 36  --   --  19  --   GLUCOSE 82 77  < > 100* 72 89  < > = values in this interval not displayed. Lipid panel wnl: Chol 159, TG 99, HDL 82, LDL 57, VLDL 20 Troponin neg x 3 Initial Cmet: AST 60, ALT 33, Tbili 2.3  Imaging/Diagnostic Tests: 2v CXR: IMPRESSION:  No active cardiopulmonary disease.   Tawni Carnes, MD 08/03/2013, 7:07 AM PGY-1, [facility] Family Medicine FPTS Intern pager: [phone], text pages welcome

## 2013-08-03 NOTE — Discharge Summary (Signed)
Family Medicine Teaching [facility] Discharge Summary  Patient name: Spencer Mendoza Medical record number: [id number] Date of birth: 1961/09/30 Age: 52 y.o. Gender: male Date of Admission: 07/28/2013  Date of Discharge: 08/03/2013 Admitting Physician: Barbaraann Barthel, MD  Primary Care Provider: No PCP Per Patient Consultants: Cardiology, GI, Critical Care  Indication for Hospitalization: Chest pain, syncope, reported hematemesis  Discharge Diagnoses/Problem List:  Alcohol withdrawal Delirium Tremens Substance abuse Alcoholic gastritis History of seizure disorder  Disposition: left AMA (against medical advice)  Discharge Condition: stable  Brief Hospital Course:  Spencer Mendoza is a 52 y.o. male that was admitted for chest pain, likely syncope on exertion, and recent history of hematemesis and melena. Patient has a significant alcohol intake history, found to have urine positive for cocaine. Cardiac workup negative, GI workup unlikely to be varices. Patient transferred to ICU for severe alcohol withdrawal and DTs requiring precedex and frequent ativan for 3 days. Patient transferred out of ICU and left AMA the following day.  1. Alcohol withdrawal/DTs: patient reported 5-6 drinks per day, more with special occasions. CIWA protocol started on admission. On HD#0 patient score 15 and 16 requiring ativan every 4 hours.  Due to frequent ativan requirement and uncontrolled withdrawal, patient was transferred to [facility] and ICU on HD#1. Precedex drip (max 0.30mcg/kg/hr) was started at this time. HD#2 CIWA score as high as 38, required frequent dosing of ativan (2mg  every 2 hours) on top of precedex. Also required supplemental oxygen after coughing fit. Trial of tapering precedex occurred on HD#4, restarted and finally discontinued at end of HD#4. At this time patient still required ativan 2mg  every 6 hours. Patient transferred out of ICU on HD#5 and back to [facility] Teaching service HD#6. Patient  without complaints of alcohol withdrawal and DTs on HD#6, patient left AMA. 2. Chest pain/syncope: patient has chronic complaint of left side chest pain with exertion at work. Cardiology consulted in ED, believed to be atypical pain but cardiac workup with EKG, troponins, echo, d-dimer; all results normal. Pain was controlled throughout ICU stay with fentanyl, morphine, norco. Patient continued to request daily more medications for pain rated 2/10 and improving while he was in hospital.  3. Alcoholic gastritis: patient reported at least 1 month history of vomiting/diarrhea with blood. Hemoglobin on admission 15.3. GI consulted and workup including mildly elevated AST 60 and normal ALT 33 (AST:ALT almost 2:1), abdominal ultrasound not concerning for cirrhosis. GI recommended against EGD/colonoscopy, believed likely some erosive gastritis and to treat with PPI. During hospitalization there was no hematemesis, only bilious vomiting, and no frank blood/melena. Attempted to continue PPI on discharge. 4. Substance abuse: social work attempted to discuss with patient outpatient resources to help with substance abuse issues, but patient left before this could occur.   Issues for Follow Up:  1. Establish PCP 2. Substance abuse  Significant Procedures: none  Significant Labs and Imaging:   Recent Labs Lab 08/01/13 0420 08/02/13 0622 08/03/13 0450  WBC 7.1 9.2 6.7  HGB 15.0 14.4 15.1  HCT 41.9 39.7 42.2  PLT 178 176 202    Recent Labs Lab 07/28/13 1034 07/29/13 0040 07/29/13 0825 07/30/13 0441 07/30/13 1723 07/31/13 0500 08/01/13 0420 08/02/13 0622 08/03/13 0450  NA 133* 133* 134* 134*  --  137 137 137 137  K 4.6 3.4* 3.5 3.4*  --  3.9 3.4* 2.8* 3.6  CL 93* 98 99 101  --  101 101 97 95*  CO2 23 24 24 20   --  18* 19 24 27   GLUCOSE 102* 82 77 68*  --  94 100* 72 89  BUN 9 7 7 8   --  8 4* 3* 4*  CREATININE 0.98 0.92 0.93 0.82  --  0.77 0.80 0.76 0.88  CALCIUM 10.5 9.0 8.8 8.6  --  8.5 8.4  8.8 10.1  MG  --   --   --   --  1.6  --   --  1.3* 1.8  PHOS  --   --   --   --   --   --   --  2.6  --   ALKPHOS 87 69 68  --   --   --   --  68  --   AST 60* 38* 36  --   --   --   --  19  --   ALT 33 25 25  --   --   --   --  14  --   ALBUMIN 4.9 3.6 3.6  --   --   --   --  3.3*  --    2v CXR (9/11) Findings: The heart size and mediastinal contours are within  normal limits. Both lungs are clear. The visualized skeletal  structures are unremarkable.  IMPRESSION:  No active cardiopulmonary disease.  US Abdomen complete (9/13) FINDINGS:  Gallbladder  No gallstones or wall thickening. Negative sonographic Murphy's  sign.  Common bile duct  Diameter: 3mm  Liver  No focal lesion identified. Within normal limits in parenchymal  echogenicity.  IVC  No abnormality visualized.  Pancreas  Limited visualization, without abnormality detected on  Spleen  Although the spleen measures large at 14 cm maximal span, it does  not appear thickened at 4 cm maximal thickness.  Right Kidney  Length: 11 cm Echogenicity within normal limits. No mass or  hydronephrosis visualized.  Left Kidney  Length: 11 cm Echogenicity within normal limits. No mass or  hydronephrosis visualized.  Abdominal aorta  No aneurysm visualized.  IMPRESSION:  Negative abdominal ultrasound.   Results/Tests Pending at Time of Discharge: none  Discharge Medications:    Medication List    ASK your doctor about these medications       GOODY HEADACHE PO  Take 1 packet by mouth daily as needed. For pain        Discharge Instructions: Please refer to Patient Instructions section of EMR for full details.  Patient was counseled important signs and symptoms that should prompt return to medical care, changes in medications, dietary instructions, activity restrictions, and follow up appointments.   Follow-Up Appointments:   Tawni Carnes, MD 08/03/2013, 3:04 PM PGY-1, [facility]

## 2013-08-04 NOTE — Progress Notes (Signed)
FMTS Attending Note  The plan of care was discussed with the resident team. I agree with the assessment and plan as documented by the resident.   Verlaine Embry MD 

## 2013-08-05 NOTE — Discharge Summary (Signed)
I agree with the discharge summary as documented.   Karina Nofsinger MD  

## 2013-12-04 ENCOUNTER — Encounter (HOSPITAL_COMMUNITY): Payer: Self-pay | Admitting: Emergency Medicine

## 2013-12-04 ENCOUNTER — Emergency Department (HOSPITAL_COMMUNITY): Payer: Medicaid Other

## 2013-12-04 ENCOUNTER — Inpatient Hospital Stay (HOSPITAL_COMMUNITY)
Admission: EM | Admit: 2013-12-04 | Discharge: 2013-12-07 | DRG: 103 | Disposition: A | Discharge status: Home / Self Care | Payer: Medicaid Other | Attending: Internal Medicine | Admitting: Internal Medicine

## 2013-12-04 DIAGNOSIS — Z8249 Family history of ischemic heart disease and other diseases of the circulatory system: Secondary | ICD-10-CM

## 2013-12-04 DIAGNOSIS — G40909 Epilepsy, unspecified, not intractable, without status epilepticus: Secondary | ICD-10-CM | POA: Diagnosis present

## 2013-12-04 DIAGNOSIS — F1020 Alcohol dependence, uncomplicated: Secondary | ICD-10-CM | POA: Diagnosis present

## 2013-12-04 DIAGNOSIS — R29810 Facial weakness: Secondary | ICD-10-CM | POA: Diagnosis present

## 2013-12-04 DIAGNOSIS — I639 Cerebral infarction, unspecified: Secondary | ICD-10-CM

## 2013-12-04 DIAGNOSIS — K529 Noninfective gastroenteritis and colitis, unspecified: Secondary | ICD-10-CM

## 2013-12-04 DIAGNOSIS — R519 Headache, unspecified: Secondary | ICD-10-CM

## 2013-12-04 DIAGNOSIS — R4701 Aphasia: Secondary | ICD-10-CM | POA: Diagnosis present

## 2013-12-04 DIAGNOSIS — I10 Essential (primary) hypertension: Secondary | ICD-10-CM | POA: Diagnosis present

## 2013-12-04 DIAGNOSIS — D72829 Elevated white blood cell count, unspecified: Secondary | ICD-10-CM

## 2013-12-04 DIAGNOSIS — G43109 Migraine with aura, not intractable, without status migrainosus: Principal | ICD-10-CM | POA: Diagnosis present

## 2013-12-04 DIAGNOSIS — F10931 Alcohol use, unspecified with withdrawal delirium: Secondary | ICD-10-CM

## 2013-12-04 DIAGNOSIS — F17200 Nicotine dependence, unspecified, uncomplicated: Secondary | ICD-10-CM | POA: Diagnosis present

## 2013-12-04 DIAGNOSIS — F10239 Alcohol dependence with withdrawal, unspecified: Secondary | ICD-10-CM | POA: Diagnosis present

## 2013-12-04 DIAGNOSIS — Z79899 Other long term (current) drug therapy: Secondary | ICD-10-CM

## 2013-12-04 DIAGNOSIS — F10231 Alcohol dependence with withdrawal delirium: Secondary | ICD-10-CM

## 2013-12-04 DIAGNOSIS — F1210 Cannabis abuse, uncomplicated: Secondary | ICD-10-CM | POA: Diagnosis present

## 2013-12-04 DIAGNOSIS — G8190 Hemiplegia, unspecified affecting unspecified side: Secondary | ICD-10-CM

## 2013-12-04 DIAGNOSIS — F10939 Alcohol use, unspecified with withdrawal, unspecified: Secondary | ICD-10-CM

## 2013-12-04 DIAGNOSIS — Z8673 Personal history of transient ischemic attack (TIA), and cerebral infarction without residual deficits: Secondary | ICD-10-CM | POA: Diagnosis present

## 2013-12-04 DIAGNOSIS — R079 Chest pain, unspecified: Secondary | ICD-10-CM

## 2013-12-04 DIAGNOSIS — R51 Headache: Secondary | ICD-10-CM

## 2013-12-04 DIAGNOSIS — Z823 Family history of stroke: Secondary | ICD-10-CM

## 2013-12-04 DIAGNOSIS — K5289 Other specified noninfective gastroenteritis and colitis: Secondary | ICD-10-CM | POA: Diagnosis present

## 2013-12-04 DIAGNOSIS — R1310 Dysphagia, unspecified: Secondary | ICD-10-CM | POA: Diagnosis present

## 2013-12-04 DIAGNOSIS — R1110 Vomiting, unspecified: Secondary | ICD-10-CM

## 2013-12-04 DIAGNOSIS — Z9089 Acquired absence of other organs: Secondary | ICD-10-CM

## 2013-12-04 DIAGNOSIS — I6350 Cerebral infarction due to unspecified occlusion or stenosis of unspecified cerebral artery: Secondary | ICD-10-CM

## 2013-12-04 HISTORY — DX: Essential (primary) hypertension: I10

## 2013-12-04 LAB — CBC WITH DIFFERENTIAL/PLATELET
Basophils Absolute: 0.1 10*3/uL (ref 0.0–0.1)
Basophils Relative: 1 % (ref 0–1)
Eosinophils Absolute: 0.3 10*3/uL (ref 0.0–0.7)
Eosinophils Relative: 2 % (ref 0–5)
HCT: 48 % (ref 39.0–52.0)
Hemoglobin: 17.6 g/dL — ABNORMAL HIGH (ref 13.0–17.0)
Lymphocytes Relative: 21 % (ref 12–46)
Lymphs Abs: 2.9 10*3/uL (ref 0.7–4.0)
MCH: 34.6 pg — ABNORMAL HIGH (ref 26.0–34.0)
MCHC: 36.7 g/dL — ABNORMAL HIGH (ref 30.0–36.0)
MCV: 94.5 fL (ref 78.0–100.0)
Monocytes Absolute: 0.9 10*3/uL (ref 0.1–1.0)
Monocytes Relative: 6 % (ref 3–12)
Neutro Abs: 10 10*3/uL — ABNORMAL HIGH (ref 1.7–7.7)
Neutrophils Relative %: 71 % (ref 43–77)
Platelets: 281 10*3/uL (ref 150–400)
RBC: 5.08 MIL/uL (ref 4.22–5.81)
RDW: 12.7 % (ref 11.5–15.5)
WBC: 14.1 10*3/uL — ABNORMAL HIGH (ref 4.0–10.5)

## 2013-12-04 LAB — URINALYSIS, ROUTINE W REFLEX MICROSCOPIC
Bilirubin Urine: NEGATIVE
Glucose, UA: NEGATIVE mg/dL
Hgb urine dipstick: NEGATIVE
Ketones, ur: NEGATIVE mg/dL
Leukocytes, UA: NEGATIVE
Nitrite: NEGATIVE
Protein, ur: NEGATIVE mg/dL
Specific Gravity, Urine: 1.004 — ABNORMAL LOW (ref 1.005–1.030)
Urobilinogen, UA: 0.2 mg/dL (ref 0.0–1.0)
pH: 6 (ref 5.0–8.0)

## 2013-12-04 LAB — COMPREHENSIVE METABOLIC PANEL
ALT: 19 U/L (ref 0–53)
AST: 25 U/L (ref 0–37)
Albumin: 4.4 g/dL (ref 3.5–5.2)
Alkaline Phosphatase: 82 U/L (ref 39–117)
BUN: 6 mg/dL (ref 6–23)
CO2: 20 mEq/L (ref 19–32)
Calcium: 9.5 mg/dL (ref 8.4–10.5)
Chloride: 98 mEq/L (ref 96–112)
Creatinine, Ser: 0.81 mg/dL (ref 0.50–1.35)
GFR calc Af Amer: >90 mL/min (ref >90)
GFR calc non Af Amer: >90 mL/min (ref >90)
Glucose, Bld: 82 mg/dL (ref 70–99)
Potassium: 3.8 mEq/L (ref 3.7–5.3)
Sodium: 139 mEq/L (ref 137–147)
Total Bilirubin: 0.8 mg/dL (ref 0.3–1.2)
Total Protein: 7.6 g/dL (ref 6.0–8.3)

## 2013-12-04 LAB — TROPONIN I: Troponin I: <0.3 ng/mL (ref <0.30)

## 2013-12-04 LAB — RAPID URINE DRUG SCREEN, HOSP PERFORMED
Amphetamines: NOT DETECTED
Barbiturates: NOT DETECTED
Benzodiazepines: NOT DETECTED
Cocaine: NOT DETECTED
Opiates: NOT DETECTED
Tetrahydrocannabinol: POSITIVE — AB

## 2013-12-04 LAB — PROTIME-INR
INR: 0.88 (ref 0.00–1.49)
Prothrombin Time: 11.8 seconds (ref 11.6–15.2)

## 2013-12-04 LAB — ETHANOL: Alcohol, Ethyl (B): 110 mg/dL — ABNORMAL HIGH (ref 0–11)

## 2013-12-04 LAB — GLUCOSE, CAPILLARY: Glucose-Capillary: 96 mg/dL (ref 70–99)

## 2013-12-04 MED ORDER — MORPHINE SULFATE 2 MG/ML IJ SOLN
2.0000 mg | INTRAMUSCULAR | Status: DC | PRN
  Administered 2013-12-05 (×4): 2 mg via INTRAVENOUS
  Filled 2013-12-04 (×4): qty 1

## 2013-12-04 MED ORDER — ACETAMINOPHEN 325 MG PO TABS
650.0000 mg | ORAL_TABLET | ORAL | Status: DC | PRN
  Administered 2013-12-06: 650 mg via ORAL
  Filled 2013-12-04: qty 2

## 2013-12-04 MED ORDER — ASPIRIN 325 MG PO TABS
325.0000 mg | ORAL_TABLET | Freq: Every day | ORAL | Status: DC
  Administered 2013-12-06 – 2013-12-07 (×2): 325 mg via ORAL
  Filled 2013-12-04 (×4): qty 1

## 2013-12-04 MED ORDER — METOCLOPRAMIDE HCL 5 MG/ML IJ SOLN
10.0000 mg | Freq: Once | INTRAMUSCULAR | Status: DC
  Filled 2013-12-04: qty 2

## 2013-12-04 MED ORDER — MORPHINE SULFATE 2 MG/ML IJ SOLN
2.0000 mg | Freq: Once | INTRAMUSCULAR | Status: AC
  Administered 2013-12-04: 2 mg via INTRAVENOUS
  Filled 2013-12-04: qty 1

## 2013-12-04 MED ORDER — ENOXAPARIN SODIUM 40 MG/0.4ML ~~LOC~~ SOLN
40.0000 mg | SUBCUTANEOUS | Status: DC
  Administered 2013-12-04 – 2013-12-06 (×3): 40 mg via SUBCUTANEOUS
  Filled 2013-12-04 (×4): qty 0.4

## 2013-12-04 MED ORDER — ACETAMINOPHEN 650 MG RE SUPP: 650.0000 mg | RECTAL | Status: DC | PRN

## 2013-12-04 MED ORDER — ASPIRIN 300 MG RE SUPP
300.0000 mg | Freq: Every day | RECTAL | Status: DC
  Administered 2013-12-04 – 2013-12-05 (×2): 300 mg via RECTAL
  Filled 2013-12-04 (×4): qty 1

## 2013-12-04 MED ORDER — ADULT MULTIVITAMIN W/MINERALS CH
1.0000 | ORAL_TABLET | Freq: Every day | ORAL | Status: DC
  Administered 2013-12-06 – 2013-12-07 (×2): 1 via ORAL
  Filled 2013-12-04 (×4): qty 1

## 2013-12-04 MED ORDER — FOLIC ACID 1 MG PO TABS
1.0000 mg | ORAL_TABLET | Freq: Every day | ORAL | Status: DC
  Administered 2013-12-06 – 2013-12-07 (×2): 1 mg via ORAL
  Filled 2013-12-04 (×4): qty 1

## 2013-12-04 MED ORDER — LORAZEPAM 1 MG PO TABS: 1.0000 mg | ORAL_TABLET | Freq: Four times a day (QID) | ORAL | Status: DC | PRN

## 2013-12-04 MED ORDER — NICOTINE 21 MG/24HR TD PT24
21.0000 mg | MEDICATED_PATCH | Freq: Every day | TRANSDERMAL | Status: DC
  Administered 2013-12-04 – 2013-12-07 (×4): 21 mg via TRANSDERMAL
  Filled 2013-12-04 (×4): qty 1

## 2013-12-04 MED ORDER — THIAMINE HCL 100 MG/ML IJ SOLN
100.0000 mg | Freq: Every day | INTRAMUSCULAR | Status: DC
  Administered 2013-12-04 – 2013-12-05 (×2): 100 mg via INTRAVENOUS
  Filled 2013-12-04 (×4): qty 1

## 2013-12-04 MED ORDER — SODIUM CHLORIDE 0.9 % IV SOLN
INTRAVENOUS | Status: AC
  Administered 2013-12-04: 1000 mL via INTRAVENOUS
  Administered 2013-12-05: 05:00:00 via INTRAVENOUS

## 2013-12-04 MED ORDER — LORAZEPAM 2 MG/ML IJ SOLN
1.0000 mg | Freq: Four times a day (QID) | INTRAMUSCULAR | Status: DC | PRN
  Administered 2013-12-04: 1 mg via INTRAVENOUS
  Filled 2013-12-04: qty 1

## 2013-12-04 MED ORDER — SENNOSIDES-DOCUSATE SODIUM 8.6-50 MG PO TABS
1.0000 | ORAL_TABLET | Freq: Every evening | ORAL | Status: DC | PRN
  Administered 2013-12-06: 1 via ORAL
  Filled 2013-12-04 (×2): qty 1

## 2013-12-04 MED ORDER — VITAMIN B-1 100 MG PO TABS
100.0000 mg | ORAL_TABLET | Freq: Every day | ORAL | Status: DC
  Administered 2013-12-06 – 2013-12-07 (×2): 100 mg via ORAL
  Filled 2013-12-04 (×4): qty 1

## 2013-12-04 MED ORDER — ONDANSETRON HCL 4 MG/2ML IJ SOLN
4.0000 mg | Freq: Four times a day (QID) | INTRAMUSCULAR | Status: DC | PRN
  Administered 2013-12-04: 4 mg via INTRAVENOUS
  Filled 2013-12-04: qty 2

## 2013-12-04 NOTE — Progress Notes (Signed)
PT Cancellation Note  Patient Details Name: Spencer Mendoza MRN: [id number] DOB: 1960-12-19   Cancelled Treatment:    Reason Eval/Treat Not Completed: Patient not medically ready. Order written for PT evaluation to start 1/19.   Elie Confer [place], [place] 12/04/2013, 3:03 PM

## 2013-12-04 NOTE — Consult Note (Signed)
Referring Physician: Dr Ree Kida    Chief Complaint: right-sided weakness with speech difficulty  HPI: Spencer Mendoza is a 53 y.o. male who was brought to the emergency department at [facility] today, 12/04/2013, for evaluation of right hemiparesis and aphasia. The time of onset is unclear; although, the patient's wife noted that he had a droopy right eye sometime last evening. This morning she saw him sitting and watching television at around 5 AM but they did not speak and she did not see his face. At around 8 AM he came into their bedroom and told his wife that he did not feel well and needed to go to the emergency room. A CT scan showed no acute intracranial abnormalities. An MRI/MRA has been ordered. The patient was not on antithrombotic medications prior to admission. He took an occasional Goody's powder and Zyrtec for allergies but no scheduled medications. All of the history was obtained from the patient's wife and sister due to the patient's inability to speak. The patient will be admitted to for a full stroke workup.  Date last known well: Date: 12/03/2013 Time last known well: Unable to determine tPA Given: No TPA secondary to late presentation.  Past Medical History  Diagnosis Date  . Epilepsy   . Migraine     Past Surgical History  Procedure Laterality Date  . Knee arthroscopy Right   . Appendectomy      Family History : the patient's mother had a stroke at age 34. Both his parents had bypass surgery for coronary artery disease.  Social History:  reports that he has been smoking.  He does not have any smokeless tobacco history on file. He reports that he drinks alcohol. He reports that he does not use illicit drugs. He had a positive drug screen in September 2014 for Opiates, cocaine and THC.  Today's drug screen is positive for THC.    Allergies: No Known Allergies  Medications:  I have reviewed the patient's current medications.  ROS: History obtained from wife and  sister  General ROS: negative for - chills, fatigue, fever, night sweats, weight gain or weight loss. Positive for a pulsating headache. Psychological ROS: negative for - behavioral disorder, hallucinations, memory difficulties, mood swings or suicidal ideation Ophthalmic ROS: negative for - blurry vision, double vision, eye pain or loss of vision ENT ROS: negative for - epistaxis, nasal discharge, oral lesions, sore throat, tinnitus or vertigo. Positive for sinus problems and allergies. Allergy and Immunology ROS: negative for - hives or itchy/watery eyes Hematological and Lymphatic ROS: negative for - bleeding problems, bruising or swollen lymph nodes Endocrine ROS: negative for - galactorrhea, hair pattern changes, polydipsia/polyuria or temperature intolerance Respiratory ROS: negative for - cough, hemoptysis, shortness of breath or wheezing Cardiovascular ROS: negative for - chest pain, dyspnea on exertion, edema or irregular heartbeat Gastrointestinal ROS: Positive for nausea, vomiting, diarrhea, and abdominal pain time several months. Genito-Urinary ROS: negative for - dysuria, hematuria, incontinence or urinary frequency/urgency Musculoskeletal ROS: Positive for recent back pain radiating down his left leg. Neurological ROS: as noted in HPI Dermatological ROS: negative for rash and skin lesion changes   Physical Examination: Blood pressure 149/110, pulse 89, temperature 97.7 F (36.5 C), temperature source Oral, resp. rate 25, SpO2 95.00%.  Neurologic Examination: Mental Status: The patient is alert and occasionally agitated. He is unable to speak except for an occasional single word. He stutters but is eventually able to get the word out.  He was able to tell me he  was in the hospital. Orientation is difficult to evaluate secondary to his speech. He is able to follow 3 step commands without difficulty.. Cranial Nerves: II: Discs not visualized; Visual fields grossly normal, pupils  equal, round, reactive to light and accommodation III,IV, VI:  Right ptosis present, extra-ocular motions intact bilaterally V,VII: Right lower facial droop and decreased sensation to light touch on the right side of his face VIII: hearing normal bilaterally IX,X: gag reflex present XI: bilateral shoulder shrug XII: tongue deviates to the right he appears to have difficulty moving it from side to side. Motor: Right : Upper extremity   4/5 with drift   Left:Upper extremity   5/5       Grip strength 3/5 right 5/5 left  Lower extremity   4/5     Lower extremity   5/5 Tone and bulk:normal tone throughout; no atrophy noted Sensory: sensation decreased to light touch in the right upper and lower extremities. Deep Tendon Reflexes: 2+ and symmetric throughout Plantars: Right: downgoing   Left: downgoing Cerebellar: Finger to nose with dysmetria both upper extremities, heel-to-shin performed with difficulty bilaterally. Gait: Unable to test CV: pulses palpable throughout   Laboratory Studies:  Basic Metabolic Panel: No results found for this basename: NA, K, CL, CO2, GLUCOSE, BUN, CREATININE, CALCIUM, MG, PHOS,  in the last 168 hours  Liver Function Tests: No results found for this basename: AST, ALT, ALKPHOS, BILITOT, PROT, ALBUMIN,  in the last 168 hours No results found for this basename: LIPASE, AMYLASE,  in the last 168 hours No results found for this basename: AMMONIA,  in the last 168 hours  CBC:  Recent Labs Lab 12/04/13 1035  WBC 14.1*  NEUTROABS 10.0*  HGB 17.6*  HCT 48.0  MCV 94.5  PLT 281    Cardiac Enzymes: No results found for this basename: CKTOTAL, CKMB, CKMBINDEX, TROPONINI,  in the last 168 hours  BNP: No components found with this basename: POCBNP,   CBG:  Recent Labs Lab 12/04/13 0949  GLUCAP 96    Microbiology: Results for orders placed during the hospital encounter of 07/28/13  MRSA PCR SCREENING     Status: None   Collection Time    07/29/13   8:44 PM      Result Value Range Status   MRSA by PCR NEGATIVE  NEGATIVE Final   Comment:            The GeneXpert MRSA Assay (FDA     approved for NASAL specimens     only), is one component of a     comprehensive MRSA colonization     surveillance program. It is not     intended to diagnose MRSA     infection nor to guide or     monitor treatment for     MRSA infections.    Coagulation Studies:  Recent Labs  12/04/13 1035  LABPROT 11.8  INR 0.88    Urinalysis: No results found for this basename: COLORURINE, APPERANCEUR, LABSPEC, PHURINE, GLUCOSEU, HGBUR, BILIRUBINUR, KETONESUR, PROTEINUR, UROBILINOGEN, NITRITE, LEUKOCYTESUR,  in the last 168 hours  Lipid Panel:    Component Value Date/Time   CHOL 159 07/29/2013 0825   TRIG 99 07/29/2013 0825   HDL 82 07/29/2013 0825   CHOLHDL 1.9 07/29/2013 0825   VLDL 20 07/29/2013 0825   LDLCALC 57 07/29/2013 0825    HgbA1C:  Lab Results  Component Value Date   HGBA1C 4.9 07/29/2013    Urine Drug Screen:  Component Value Date/Time   LABOPIA POSITIVE* 07/28/2013 1406   COCAINSCRNUR POSITIVE* 07/28/2013 1406   LABBENZ NONE DETECTED 07/28/2013 1406   AMPHETMU NONE DETECTED 07/28/2013 1406   THCU POSITIVE* 07/28/2013 1406   LABBARB NONE DETECTED 07/28/2013 1406    Alcohol Level: No results found for this basename: ETH,  in the last 168 hours  Other results: EKG: sinus rhythm rate 83 beats per minute.  Imaging:  Ct Head Wo Contrast 12/04/2013    No acute intracranial abnormalities. Similar sinusitis when compared to the prior study, but with the interval development of mild acute sphenoid sinusitis.       Mikey Bussing PA-C [facility] Pager [phone] 12/04/2013, 12:19 PM  Patient seen and examined.  Clinical course and management discussed.  Necessary edits performed.  I agree with the above.  Assessment and plan of care developed and discussed below.     Assessment: 53 y.o. male presenting with headache,  right hemiparesis and difficulty with speech.  Neurological examination does show some functional features.  CT of the head was reviewed and show no acute changes.  Due to time of presentation the patient is not a tPA candidate.  Although acute infarct is in the differential, must also consider complicated migraine and seizure.  Further work up recommended.    Stroke Risk Factors - family history and smoking  Plan: 1. HgbA1c, fasting lipid panel 2. MRI, MRA  of the brain without contrast 3. PT consult, OT consult, Speech consult 4. Echocardiogram 5. Carotid dopplers 6. Prophylactic therapy-Antiplatelet med: Aspirin - dose 325 mg daily 7. Risk factor modification 8. Telemetry monitoring 9. Frequent neuro checks 10. EEG 11. Ultram prn for headache.     Alexis Goodell, MD [facility] [phone]  12/04/2013  3:39 PM

## 2013-12-04 NOTE — ED Notes (Signed)
CBG 96. 

## 2013-12-04 NOTE — ED Notes (Signed)
Patient presents via EMS with stroke like symptoms, left sided facial droop, right sided weakness upper and lower extremities, slurred speech, left sided h/a, altered mental status, follows commands  LAST SEEN NORMAL 2200 last night.

## 2013-12-04 NOTE — ED Provider Notes (Signed)
CSN: [id number]     Arrival date & time 12/04/13  4098 History   First MD Initiated Contact with Patient 12/04/13 [phone]     Chief Complaint  Patient presents with  . Stroke Symptoms    HPI  Patient presents with complaint of a left-sided headache difficulty with speech and vision and right arm weakness. Majority of his history was arrival as given by the wife as the patient is unable to speak. He arrives by ambulance. Wife states that he complained of not feeling well last night went to bed about 10:00. She noted that she got his right eye looked "droopy". He went to bed. She waking this morning at 5 AM and he was up and on the couch. She still noted that his right face with "droopy" she went back to bed. About 9:00 he walked to the bedroom and was able to tell her that he didn't feel well and wanted to the hospital. She states she was very difficult to understand with his speech in his right face with very droopy.  He is a history of migraine headaches. His history of alcohol and substance use and abuse. Has a history of alcohol withdrawal seizures. No apparent seizure. No obvious shakes or symptoms of withdrawal yesterday or today. He states that he drinks "last". No fall or known injury. No prior stroke or TIA. Has never had weakness or neurological deficits with his migraine headaches.  Past Medical History  Diagnosis Date  . Epilepsy   . Migraine    Past Surgical History  Procedure Laterality Date  . Knee arthroscopy Right   . Appendectomy     Family History  Problem Relation Age of Onset  . Heart disease Mother   . Heart disease Father    History  Substance Use Topics  . Smoking status: Current Every Day Smoker -- 1.00 packs/day for 35 years  . Smokeless tobacco: Not on file  . Alcohol Use: Yes     Comment: Drinks 4-5 beers daily    Review of Systems  Constitutional: Negative for fever, chills, diaphoresis, appetite change and fatigue.  HENT: Negative for mouth sores, sore  throat and trouble swallowing.   Eyes: Positive for photophobia. Negative for visual disturbance.  Respiratory: Negative for cough, chest tightness, shortness of breath and wheezing.   Cardiovascular: Negative for chest pain.  Gastrointestinal: Negative for nausea, vomiting, abdominal pain, diarrhea and abdominal distention.  Endocrine: Negative for polydipsia, polyphagia and polyuria.  Genitourinary: Negative for dysuria, frequency and hematuria.  Musculoskeletal: Negative for gait problem.  Skin: Negative for color change, pallor and rash.  Neurological: Positive for seizures, facial asymmetry, speech difficulty, weakness and headaches. Negative for dizziness, tremors, syncope and light-headedness.  Hematological: Does not bruise/bleed easily.  Psychiatric/Behavioral: Negative for behavioral problems and confusion.    Allergies  Review of patient's allergies indicates no known allergies.  Home Medications   Current Outpatient Rx  Name  Route  Sig  Dispense  Refill  . Aspirin-Acetaminophen-Caffeine (GOODY HEADACHE PO)   Oral   Take 1 packet by mouth daily as needed. For pain         . cetirizine (ZYRTEC) 10 MG tablet   Oral   Take 10 mg by mouth daily.         . Multiple Vitamins-Minerals (MULTIVITAMIN WITH MINERALS) tablet   Oral   Take 1 tablet by mouth daily.         [name] docusate sodium (COLACE) 100 MG capsule   Oral  Take 1 capsule (100 mg total) by mouth 2 (two) times daily.   10 capsule   0   . folic acid (FOLVITE) 1 MG tablet   Oral   Take 1 tablet (1 mg total) by mouth daily.   30 tablet   0   . nicotine (NICODERM CQ - DOSED IN MG/24 HOURS) 21 mg/24hr patch   Transdermal   Place 1 patch (21 mg total) onto the skin daily.   28 patch   0   . thiamine 100 MG tablet   Oral   Take 1 tablet (100 mg total) by mouth daily.   30 tablet   0   . traMADol (ULTRAM) 50 MG tablet   Oral   Take 1 tablet (50 mg total) by mouth every 6 (six) hours as needed for  moderate pain or severe pain.   30 tablet   0    BP 138/84  Pulse 67  Temp(Src) 98 F (36.7 C) (Oral)  Resp 16  Ht 6\' 1"  (1.854 m)  Wt 175 lb 9.6 oz (79.652 kg)  BMI 23.17 kg/m2  SpO2 94% Physical Exam  Constitutional:  Adult male. Right upper facial droop. He has a symmetric purposeful smile without obvious droop.  Eyes:  For shot for movements. He has a right homonymous hemianopsia.  Neck:  No JVD or bruits  Pulmonary/Chest:  Occasional cough. No diminished breath sounds crackles rales or rhonchi  Abdominal:  Abdomen benign  Musculoskeletal:  His all 4 extremities. He has weakness of the right upper extremity  Neurological:  Right facial droop. Left homonymous hemianopsia. Right upper extremity pronator drift.  Skin:  No rash or lesions. No signs of trauma.  Psychiatric:  Anxious. Continues to try to sit.    ED Course  Procedures (including critical care time) Labs Review Labs Reviewed  CBC WITH DIFFERENTIAL - Abnormal; Notable for the following:    WBC 14.1 (*)    Hemoglobin 17.6 (*)    MCH 34.6 (*)    MCHC 36.7 (*)    Neutro Abs 10.0 (*)    All other components within normal limits  ETHANOL - Abnormal; Notable for the following:    Alcohol, Ethyl (B) 110 (*)    All other components within normal limits  URINE RAPID DRUG SCREEN (HOSP PERFORMED) - Abnormal; Notable for the following:    Tetrahydrocannabinol POSITIVE (*)    All other components within normal limits  URINALYSIS, ROUTINE W REFLEX MICROSCOPIC - Abnormal; Notable for the following:    Specific Gravity, Urine 1.004 (*)    All other components within normal limits  CBC - Abnormal; Notable for the following:    MCH 34.2 (*)    All other components within normal limits  BASIC METABOLIC PANEL - Abnormal; Notable for the following:    CO2 16 (*)    Glucose, Bld 61 (*)    All other components within normal limits  LIPID PANEL - Abnormal; Notable for the following:    Triglycerides 283 (*)    VLDL  57 (*)    All other components within normal limits  BASIC METABOLIC PANEL - Abnormal; Notable for the following:    Potassium 3.6 (*)    All other components within normal limits  BASIC METABOLIC PANEL - Abnormal; Notable for the following:    Sodium 136 (*)    Potassium 3.6 (*)    All other components within normal limits  CLOSTRIDIUM DIFFICILE BY PCR  GLUCOSE, CAPILLARY  PROTIME-INR  TROPONIN I  COMPREHENSIVE METABOLIC PANEL  HEMOGLOBIN A1C  GLUCOSE, CAPILLARY  GI PATHOGEN PANEL BY PCR, STOOL   Imaging Review Mr Brain Wo Contrast  12/06/2013   CLINICAL DATA:  Stroke like symptoms. Right hemiparesis and aphasia.  The examination had to be discontinued prior to completion due to patient refusal.  EXAM: MRI HEAD WITHOUT CONTRAST  MRA HEAD WITHOUT CONTRAST  TECHNIQUE: Multiplanar, multiecho pulse sequences of the brain and surrounding structures were obtained without intravenous contrast. Angiographic images of the head were obtained using MRA technique without contrast.  COMPARISON:  CT head without contrast 12/04/2013  FINDINGS: MRI HEAD FINDINGS  The diffusion-weighted images demonstrate no evidence for acute or subacute infarction. No hemorrhage or mass lesion is present. The ventricles are of normal size. No significant extra-axial fluid collection is present.  Flow is present in the major intracranial arteries. The globes and orbits are intact.  A fluid level is present in the left sphenoid sinus. Mucosal thickening is present in the right sphenoid sinus, bilateral ethmoid air cells, frontal sinuses, and maxillary sinuses. There is a tiny fluid level in the right maxillary sinus. Minimal fluid is present in the mastoid air cells bilaterally. No obstructing nasopharyngeal lesion is evident.  MRA HEAD FINDINGS  The internal carotid arteries are within normal limits from the high cervical segments through the ICA termini bilaterally. The A1 and M1 segments are normal. A tiny anterior  communicating artery is present. The MCA bifurcations are within normal limits. The ACA and MCA branch vessels are normal.  The vertebral arteries are codominant. The right PICA origin is visualized and normal. Prominent AICA vessels bilaterally. The basilar artery is normal. Both posterior cerebral arteries originate from the basilar tip. The PCA branch vessels are unremarkable.  IMPRESSION: 1. Normal MRI appearance of the brain. 2. Diffuse sinus disease with fluid levels in the left sphenoid sinus and right maxillary sinus suggesting acute disease. 3. Normal variant MRA circle of Willis without evidence for significant proximal stenosis, aneurysm, or branch vessel occlusion.   Electronically Signed   By: Lawrence Santiago M.D.   On: 12/06/2013 09:32   Mr Jodene Nam Head/brain Wo Cm  12/06/2013   CLINICAL DATA:  Stroke like symptoms. Right hemiparesis and aphasia.  The examination had to be discontinued prior to completion due to patient refusal.  EXAM: MRI HEAD WITHOUT CONTRAST  MRA HEAD WITHOUT CONTRAST  TECHNIQUE: Multiplanar, multiecho pulse sequences of the brain and surrounding structures were obtained without intravenous contrast. Angiographic images of the head were obtained using MRA technique without contrast.  COMPARISON:  CT head without contrast 12/04/2013  FINDINGS: MRI HEAD FINDINGS  The diffusion-weighted images demonstrate no evidence for acute or subacute infarction. No hemorrhage or mass lesion is present. The ventricles are of normal size. No significant extra-axial fluid collection is present.  Flow is present in the major intracranial arteries. The globes and orbits are intact.  A fluid level is present in the left sphenoid sinus. Mucosal thickening is present in the right sphenoid sinus, bilateral ethmoid air cells, frontal sinuses, and maxillary sinuses. There is a tiny fluid level in the right maxillary sinus. Minimal fluid is present in the mastoid air cells bilaterally. No obstructing  nasopharyngeal lesion is evident.  MRA HEAD FINDINGS  The internal carotid arteries are within normal limits from the high cervical segments through the ICA termini bilaterally. The A1 and M1 segments are normal. A tiny anterior communicating artery is present. The MCA bifurcations are within normal limits.  The ACA and MCA branch vessels are normal.  The vertebral arteries are codominant. The right PICA origin is visualized and normal. Prominent AICA vessels bilaterally. The basilar artery is normal. Both posterior cerebral arteries originate from the basilar tip. The PCA branch vessels are unremarkable.  IMPRESSION: 1. Normal MRI appearance of the brain. 2. Diffuse sinus disease with fluid levels in the left sphenoid sinus and right maxillary sinus suggesting acute disease. 3. Normal variant MRA circle of Willis without evidence for significant proximal stenosis, aneurysm, or branch vessel occlusion.   Electronically Signed   By: Lawrence Santiago M.D.   On: 12/06/2013 09:32    EKG Interpretation    Date/Time:  Sunday December 04 2013 09:43:31 EST Ventricular Rate:  83 PR Interval:  177 QRS Duration: 109 QT Interval:  378 QTC Calculation: 444 R Axis:   164 Text Interpretation:  Age not entered, assumed to be  53 years old for purpose of ECG interpretation Sinus rhythm Right axis deviation Low voltage, extremity leads Probable anteroseptal infarct, old Baseline wander in lead(s) V2 V6 ED PHYSICIAN INTERPRETATION AVAILABLE IN [place] Confirmed by TEST, RECORD ([id number]) on 12/06/2013 7:34:47 AM            MDM   1. CVA (cerebral infarction)   2. Alcohol withdrawal   3. Gastroenteritis   4. Headache   5. Hypertension   6. Leukocytosis   7. Vomiting   8. Hemiparesis   9. Chest pain   10. Hemiplegia, unspecified, affecting dominant side   11. Delirium tremens     Differential diagnoses would include hemorrhagic stroke. Ischemic stroke. Complex migraine headache. Seizure with Todd's  paresis. No stigmata of seizure with shoulder pain or signs of injury or tongue biting or incontinence. No witnessed seizure per wife. Upon my initial evaluation placed a call to neurology. The skull was returned by Dr. Doy Mince. She'll see the patient in consult. Patient's symptoms started at least 5 AM this morning. His last time seen normal at 2200 last p.m. He is out of the window for acute IV lytic therapy.  3:00 PM : Patient evaluated. CT scan shows no acute findings. He has improved use of his right upper extremity but continues to have pronator drift. His speech has improved. He is able to say 2-3 words at a time now.     Tanna Furry, MD 12/07/13 1500

## 2013-12-04 NOTE — H&P (Signed)
[facility] History and Physical  Spencer Mendoza GYI:[id number] DOB: 05/10/1961 DOA: 12/04/2013  Referring physician:  PCP: No PCP Per Patient  Specialists:   Chief Complaint: Stroke like symptoms  HPI: Spencer Mendoza is a 53 y.o. male  With a history of migraine, alcohol abuse, epilepsy that presents to the emergency department with stroke like symptoms. Information details history and physical were given by the wife. Patient was last seen "normal" around 2200 01/03/2014.  It seems the patient and his wife were out yesterday afternoon at which point upon arrival back home, patient stated he was not feeling well approximately 6 PM yesterday evening. He then proceeded to vomit a couple of times and had diarrhea as well. Proceeded to go to bed around 10 PM. The patient's wife woke up this morning, patient on the couch, with facial droop. Around 9 AM, patient was able to walk into the bedroom at which point he did tell his wife he was not feeling well and wanted to go to the hospital. Upon arrival to the hospital, patient was found to have right-sided hemiparesis as well as aphasia. According to his wife speech has improved slightly. CT scan of the head was conducted showing nothing acute. Patient does have a history of alcohol abuse with withdrawal seizures. His last known drink was yesterday. Per patient's wife, patient also had a history of drug abuse the past.  Review of Systems:  Unable to obtain from patient at this time due to current aphasia  Past Medical History  Diagnosis Date  . Epilepsy   . Migraine    Past Surgical History  Procedure Laterality Date  . Knee arthroscopy Right   . Appendectomy     Social History:  reports that he has been smoking, approximately half a pack per day. He does not have any smokeless tobacco history on file. He reports that he drinks alcohol. He reports that he does not use illicit drugs. Lives at home with his wife.  No Known  Allergies  No family history on file.  Prior to Admission medications   Medication Sig Start Date End Date Taking? Authorizing Provider  Aspirin-Acetaminophen-Caffeine (GOODY HEADACHE PO) Take 1 packet by mouth daily as needed. For pain   Yes Historical Provider, MD  cetirizine (ZYRTEC) 10 MG tablet Take 10 mg by mouth daily.   Yes Historical Provider, MD  Multiple Vitamins-Minerals (MULTIVITAMIN WITH MINERALS) tablet Take 1 tablet by mouth daily.   Yes Historical Provider, MD   Physical Exam: Filed Vitals:   12/04/13 0953  BP: 149/110  Pulse: 89  Temp: 97.7 F (36.5 C)  Resp: 25     General: Well developed, well nourished, NAD, appears stated age  HEENT: NCAT, PERRLA, EOMI, Anicteic Sclera, mucous membranes moist. No pharyngeal erythema or exudates, right sided facial droop.  Neck: Supple, no JVD, no masses  Cardiovascular: S1 S2 auscultated, no rubs, murmurs or gallops. Regular rate and rhythm.  Respiratory: Clear to auscultation bilaterally with equal chest rise  Abdomen: Soft, nontender, nondistended, + bowel sounds  Extremities: warm dry without cyanosis clubbing or edema.    Neuro: AAOx3, cranial nerves grossly intact. Right-sided facial droop, right upper and lower extremity 4/5 strength, left upper and lower extremity 5 out of 5 strength, right upper extremity pronator drift  Skin: Without rashes exudates or nodules  Psych: Anxious  Labs on Admission:  Basic Metabolic Panel: No results found for this basename: NA, K, CL, CO2, GLUCOSE, BUN, CREATININE, CALCIUM, MG, PHOS,  in the last 168 hours Liver Function Tests: No results found for this basename: AST, ALT, ALKPHOS, BILITOT, PROT, ALBUMIN,  in the last 168 hours No results found for this basename: LIPASE, AMYLASE,  in the last 168 hours No results found for this basename: AMMONIA,  in the last 168 hours CBC:  Recent Labs Lab 12/04/13 1035  WBC 14.1*  NEUTROABS 10.0*  HGB 17.6*  HCT 48.0  MCV 94.5   PLT 281   Cardiac Enzymes: No results found for this basename: CKTOTAL, CKMB, CKMBINDEX, TROPONINI,  in the last 168 hours  BNP (last 3 results) No results found for this basename: PROBNP,  in the last 8760 hours CBG:  Recent Labs Lab 12/04/13 0949  GLUCAP 96    Radiological Exams on Admission: Ct Head Wo Contrast  12/04/2013   CLINICAL DATA:  Left facial droop, right-sided weakness, slurred speech, left-sided headache, altered mental status  EXAM: CT HEAD WITHOUT CONTRAST  TECHNIQUE: Contiguous axial images were obtained from the base of the skull through the vertex without intravenous contrast.  COMPARISON:  11/03/2010  FINDINGS: Mild mucosal periosteal thickening of the maxillary sinuses. Multifocal complete ethmoid air cell opacification involving scattered bilateral anterior and frontal ethmoid air cells. Frontal sinuses clear. Small air-fluid level in the sphenoid sinus. Calvarium is intact.  Attenuation is normal with no evidence of hemorrhage, infarct, extra-axial fluid, or mass. No hydrocephalus. Mild stable age-related atrophy.  IMPRESSION: No acute intracranial abnormalities. Similar sinusitis when compared to the prior study, but with the interval development of mild acute sphenoid sinusitis.   Electronically Signed   By: Skipper Cliche M.D.   On: 12/04/2013 10:24    EKG: Independently reviewed. Sinus rhythm, rate 83.  Assessment/Plan Active Problems:   Vomiting   Alcohol withdrawal   CVA (cerebral infarction)   Leukocytosis   Acute Gastroenteritis   Hypertension   Headache   Hemiparesis   Right-sided hemophoresis with aphasia suspect CVA Patient will be admitted to neuro telemetry. CT scan showed no acute intercranial abnormalities. MRI and MRA have been ordered as well as an echocardiogram and carotid Dopplers. Neurology has also been consulted. Patient be placed on aspirin therapy. Will also obtain hemoglobin A1c as well as a lipid panel. Patient will be n.p.o.  until speech can evaluate him.  Patient will also have PT and OT evaluations.  Acute gastroenteritis Patient currently has had nausea, vomiting and diarrhea that began yesterday according to his wife. Will obtain a C. difficile as well as a GI pathogen panel. Will place patient on Zofran as an antiemetic. Also place patient on IV fluids. This may be viral in etiology as patient has not been on any antibiotics recently and has not had any sick contacts.  Leukocytosis likely secondary to gastroenteritis versus acute phase reactant Wil continue to monitor patient's CBC will also place him on IV fluids. Will refrain from giving any antibiotic therapy at this time.  History of alcoholism Patient has been admitted in the past for alcoholism with delirium tremens. Will place him on Cipro protocol. Pending drug screen as well as alcohol level.  Hypertension Below for permissive hypertension in the setting of CVA.  History of migraine headaches Patient is currently complaining of headache however is unable to provide information. The wife patient was complaining of headache for the last several days. He usually uses a daily powder. Patient's wife does not want him taking morphine or any type of narcotics. Place patient on Tylenol and continue to monitor.  DVT prophylaxis: Lovenox  Code Status: Full  Condition: Guarded  Family Communication: Wife and sister. Admission, patients condition and plan of care including tests being ordered have been discussed with the patient and family who indicate understanding and agree with the plan and Code Status.  Disposition Plan: Admitted    Time spent: 60 minutes  Spencer Mendoza D.O. [facility] Pager [phone]  If 7PM-7AM, please contact night-coverage [email or web address] Password [facility] 12/04/2013, 11:41 AM

## 2013-12-04 NOTE — ED Notes (Signed)
Dr. James at bedside  

## 2013-12-05 ENCOUNTER — Inpatient Hospital Stay (HOSPITAL_COMMUNITY): Payer: Medicaid Other

## 2013-12-05 DIAGNOSIS — I6789 Other cerebrovascular disease: Secondary | ICD-10-CM

## 2013-12-05 DIAGNOSIS — R079 Chest pain, unspecified: Secondary | ICD-10-CM

## 2013-12-05 DIAGNOSIS — G8190 Hemiplegia, unspecified affecting unspecified side: Secondary | ICD-10-CM

## 2013-12-05 LAB — CBC
HCT: 45.9 % (ref 39.0–52.0)
Hemoglobin: 16.3 g/dL (ref 13.0–17.0)
MCH: 34.2 pg — ABNORMAL HIGH (ref 26.0–34.0)
MCHC: 35.5 g/dL (ref 30.0–36.0)
MCV: 96.4 fL (ref 78.0–100.0)
Platelets: 248 10*3/uL (ref 150–400)
RBC: 4.76 MIL/uL (ref 4.22–5.81)
RDW: 12.8 % (ref 11.5–15.5)
WBC: 6.9 10*3/uL (ref 4.0–10.5)

## 2013-12-05 LAB — LIPID PANEL
Cholesterol: 174 mg/dL (ref 0–200)
HDL: 55 mg/dL (ref >39)
LDL Cholesterol: 62 mg/dL (ref 0–99)
Total CHOL/HDL Ratio: 3.2 RATIO
Triglycerides: 283 mg/dL — ABNORMAL HIGH (ref <150)
VLDL: 57 mg/dL — ABNORMAL HIGH (ref 0–40)

## 2013-12-05 LAB — BASIC METABOLIC PANEL
BUN: 12 mg/dL (ref 6–23)
CO2: 16 mEq/L — ABNORMAL LOW (ref 19–32)
Calcium: 8.9 mg/dL (ref 8.4–10.5)
Chloride: 101 mEq/L (ref 96–112)
Creatinine, Ser: 0.93 mg/dL (ref 0.50–1.35)
GFR calc Af Amer: >90 mL/min (ref >90)
GFR calc non Af Amer: >90 mL/min (ref >90)
Glucose, Bld: 61 mg/dL — ABNORMAL LOW (ref 70–99)
Potassium: 3.9 mEq/L (ref 3.7–5.3)
Sodium: 140 mEq/L (ref 137–147)

## 2013-12-05 LAB — HEMOGLOBIN A1C
Hgb A1c MFr Bld: 5 % (ref <5.7)
Mean Plasma Glucose: 97 mg/dL (ref <117)

## 2013-12-05 NOTE — Plan of Care (Signed)
Problem: Progression Outcomes Goal: Communication method established Outcome: Progressing Pt was also given stroke education booklet from pharm

## 2013-12-05 NOTE — Evaluation (Signed)
Clinical/Bedside Swallow Evaluation Patient Details  Name: Spencer Mendoza MRN: [id number] Date of Birth: 08/20/1961  Today's Date: 12/05/2013 Time: 1000-1030 SLP Time Calculation (min): 30 min  Past Medical History:  Past Medical History  Diagnosis Date  . Epilepsy   . Migraine    Past Surgical History:  Past Surgical History  Procedure Laterality Date  . Knee arthroscopy Right   . Appendectomy     HPI:  Spencer Mendoza is a 53 y.o. male who was brought to the emergency department at [facility] today, 12/04/2013, for evaluation of right hemiparesis and aphasia. The time of onset is unclear; although, the patient's wife noted that he had a droopy right eye sometime last evening. This morning she saw him sitting and watching television at around 5 AM but they did not speak and she did not see his face. At around 8 AM he came into their bedroom and told his wife that he did not feel well and needed to go to the emergency room. A CT scan showed no acute intracranial abnormalities. An MRI/MRA has been ordered. The patient was not on antithrombotic medications prior to admission. He took an occasional Goody's powder and Zyrtec for allergies but no scheduled medications. All of the history was obtained from the patient's wife and sister due to the patient's inability to speak. The patient will be admitted to for a full stroke workup   Assessment / Plan / Recommendation Clinical Impression  Patient with atypical presentation.  Oral-motor exam reveals good lip and tongue strenth, but reduced ROM, with apparent inability to elevate or lateralize tongue tip.  Tongue is midline upon protusion.  Patient appears to hold his right eye tightly closed.  Pt. took sips of water, and ate applesauce with much struggle type behaviors, grimmacing and swallowing with strong contractions of the neck muscles, multiple times with each bite/sip.  No cough or throat clearing was observed.  Pt. was unable to suck liquids  into the straw, but was drawing in air laterally around the straw.  With the cracker, patient was able to chew somewhat slowly,without difficulty,  then began gagging and spitting cracker bolus into cup.  Pt. gestures that he needed water, which he then drank without difficulty, other than facial grimmacing.  Will begin pureed diet with thin liquids and advance as tolerated  Pills whole with liquid.    Aspiration Risk  Mild    Diet Recommendation Dysphagia 1 (Puree);Thin liquid   Liquid Administration via: Cup Medication Administration: Whole meds with liquid (Crush if unable to swallow whole) Supervision: Staff to assist with self feeding Compensations: Small sips/bites;Slow rate Postural Changes and/or Swallow Maneuvers: Seated upright 90 degrees    Other  Recommendations Oral Care Recommendations: Oral care BID Other Recommendations: Clarify dietary restrictions   Follow Up Recommendations  Other (comment) (TBD)    Frequency and Duration min 1 x/week  1 week   Pertinent Vitals/Pain:  Afebrile; diminished at bases, (+smoker)     SLP Swallow Goals  See care plan   Swallow Study Prior Functional Status       General HPI: Spencer Mendoza is a 53 y.o. male who was brought to the emergency department at [facility] today, 12/04/2013, for evaluation of right hemiparesis and aphasia. The time of onset is unclear; although, the patient's wife noted that he had a droopy right eye sometime last evening. This morning she saw him sitting and watching television at around 5 AM but they did not speak and she  did not see his face. At around 8 AM he came into their bedroom and told his wife that he did not feel well and needed to go to the emergency room. A CT scan showed no acute intracranial abnormalities. An MRI/MRA has been ordered. The patient was not on antithrombotic medications prior to admission. He took an occasional Goody's powder and Zyrtec for allergies but no scheduled medications. All of  the history was obtained from the patient's wife and sister due to the patient's inability to speak. The patient will be admitted to for a full stroke workup Type of Study: Bedside swallow evaluation Previous Swallow Assessment: none Diet Prior to this Study: NPO Temperature Spikes Noted: No History of Recent Intubation: No Behavior/Cognition: Alert Oral Cavity - Dentition: Adequate natural dentition;Missing dentition Self-Feeding Abilities: Needs assist (shaky) Patient Positioning: Upright in bed Baseline Vocal Quality: Aphonic Volitional Cough: Cognitively unable to elicit (spontaneous, strong cough) Volitional Swallow: Unable to elicit    Oral/Motor/Sensory Function Overall Oral Motor/Sensory Function: Impaired Labial ROM: Within Functional Limits Labial Symmetry: Within Functional Limits Labial Strength: Within Functional Limits Lingual ROM: Reduced right;Reduced left Lingual Symmetry: Within Functional Limits Lingual Strength: Reduced Facial ROM: Within Functional Limits Facial Symmetry: Other (Comment) (keeps right eye closed tightly) Facial Strength: Within Functional Limits Velum: Within Functional Limits Mandible: Within Functional Limits   Ice Chips Ice chips: Within functional limits (Stuggle behaviors with each swallow) Presentation: Spoon   Thin Liquid Thin Liquid: Within functional limits (except struggle behaviors) Presentation: Cup ("unable to suck through straw)    Nectar Thick Nectar Thick Liquid: Not tested   Honey Thick Honey Thick Liquid: Not tested   Puree Puree: Within functional limits Presentation: Spoon   Solid   GO    Solid: Impaired Pharyngeal Phase Impairments: Multiple swallows;Cough - Delayed (Gagging, coughing, spitting cracker into cup)       Dominyk Law T 12/05/2013,10:53 AM

## 2013-12-05 NOTE — Progress Notes (Signed)
  Echocardiogram 2D Echocardiogram has been performed.  Spencer Mendoza FRANCES 12/05/2013, 4:41 PM

## 2013-12-05 NOTE — Progress Notes (Signed)
EEG Completed; Results Pending  

## 2013-12-05 NOTE — Procedures (Signed)
ELECTROENCEPHALOGRAM REPORT   Patient: Spencer Mendoza       Room #: 8K99 EEG No. ID: [id number] Age: 53 y.o.        Sex: male Referring Physician: Rai Report Date:  12/05/2013        Interpreting Physician: Alexis Goodell D  History: Spencer Mendoza is an 53 y.o. male presenting with headache and right sided weakness  Medications:  Scheduled: . aspirin  300 mg Rectal Daily   Or  . aspirin  325 mg Oral Daily  . enoxaparin (LOVENOX) injection  40 mg Subcutaneous Q24H  . folic acid  1 mg Oral Daily  . metoCLOPramide (REGLAN) injection  10 mg Intravenous Once  . multivitamin with minerals  1 tablet Oral Daily  . nicotine  21 mg Transdermal Daily  . thiamine  100 mg Oral Daily   Or  . thiamine  100 mg Intravenous Daily    Conditions of Recording:  This is a 16 channel EEG carried out with the patient in the awake state.  Description:  The waking background activity consists of a low voltage, symmetrical, fairly well organized, 9 Hz alpha activity, seen from the parieto-occipital and posterior temporal regions.  Low voltage fast activity, poorly organized, is seen anteriorly and is at times superimposed on more posterior regions.  A mixture of theta and alpha rhythms are seen from the central and temporal regions. The patient does not drowse or sleep. Hyperventilation was not performed.  Intermittent photic stimulation was performed but failed to illicit any change in the tracing.    IMPRESSION: This is a normal awake electroencephalogram  Comment:  An EEG with the patient sleep deprived to elicit drowse and light sleep may be desirable to further elicit a possible seizure disorder.     Alexis Goodell, MD [facility] [phone] 12/05/2013, 4:29 PM

## 2013-12-05 NOTE — Progress Notes (Signed)
Utilization review completed.  

## 2013-12-05 NOTE — Progress Notes (Signed)
Patient ID: MARIEL GAUDIN  male  KXF:[id number]    DOB: 09/13/1961    DOA: 12/04/2013  PCP: No PCP Per Patient  Assessment/Plan:  Right-sided hemophoresis with aphasia/dysarthria: Acute CVA versus complex migraine versus seizure versus some functional features - CT scan did not show any acute intracranial abnormalities  - MRI/MRA brain pending  - Urine tox screen positive for [facility]  - Discussed with neurology, will continue full stroke workup for now, EEG   - PT evaluation recommending CIR, consult placed - Carotid Doppler showed a 40-59% ICA stenosis on right, left 1-39% - EEG done results pending, 2-D echo pending  Acute gastroenteritis  - Continue IV fluids, antiemetics, follow cultures, leukocytosis resolved   Leukocytosis likely secondary to gastroenteritis versus acute phase reactant  - Resolved, continue IV fluids  History of alcoholism  -EtOH level 110,  monitor for alcohol withdrawal   Hypertension  Stable   History of migraine headaches  - Currently stable  DVT Prophylaxis: Lovenox  Code Status: Full code  Family Communication:  Disposition:    Subjective: Patient still complaining of right-sided weakness, dysarthria   Objective: Weight change:   Intake/Output Summary (Last 24 hours) at 12/05/13 1417 Last data filed at 12/05/13 1100  Gross per 24 hour  Intake      0 ml  Output    700 ml  Net   -700 ml   Blood pressure 137/91, pulse 81, temperature 97.8 F (36.6 C), temperature source Oral, resp. rate 16, height 6\' 1"  (1.854 m), weight 79.652 kg (175 lb 9.6 oz), SpO2 96.00%.  Physical Exam: General: Alert and awake, ? Dysarthria  CVS: S1-S2 clear, no murmur rubs or gallops Chest: clear to auscultation bilaterally, no wheezing, rales or rhonchi Abdomen: soft nontender, nondistended, normal bowel sounds  Extremities: no cyanosis, clubbing or edema noted bilaterally Neuro:  Dysarthria, right-sided hemiparesis poor effort (?)  Lab Results: Basic  Metabolic Panel:  Recent Labs Lab 12/04/13 1035 12/05/13 0450  NA 139 140  K 3.8 3.9  CL 98 101  CO2 20 16*  GLUCOSE 82 61*  BUN 6 12  CREATININE 0.81 0.93  CALCIUM 9.5 8.9   Liver Function Tests:  Recent Labs Lab 12/04/13 1035  AST 25  ALT 19  ALKPHOS 82  BILITOT 0.8  PROT 7.6  ALBUMIN 4.4   No results found for this basename: LIPASE, AMYLASE,  in the last 168 hours No results found for this basename: AMMONIA,  in the last 168 hours CBC:  Recent Labs Lab 12/04/13 1035 12/05/13 0450  WBC 14.1* 6.9  NEUTROABS 10.0*  --   HGB 17.6* 16.3  HCT 48.0 45.9  MCV 94.5 96.4  PLT 281 248   Cardiac Enzymes:  Recent Labs Lab 12/04/13 1035  TROPONINI <0.30   BNP: No components found with this basename: POCBNP,  CBG:  Recent Labs Lab 12/04/13 0949  GLUCAP 96     Micro Results: No results found for this or any previous visit (from the past 240 hour(s)).  Studies/Results: Ct Head Wo Contrast  12/04/2013   CLINICAL DATA:  Left facial droop, right-sided weakness, slurred speech, left-sided headache, altered mental status  EXAM: CT HEAD WITHOUT CONTRAST  TECHNIQUE: Contiguous axial images were obtained from the base of the skull through the vertex without intravenous contrast.  COMPARISON:  11/03/2010  FINDINGS: Mild mucosal periosteal thickening of the maxillary sinuses. Multifocal complete ethmoid air cell opacification involving scattered bilateral anterior and frontal ethmoid air cells. Frontal sinuses clear. Small  air-fluid level in the sphenoid sinus. Calvarium is intact.  Attenuation is normal with no evidence of hemorrhage, infarct, extra-axial fluid, or mass. No hydrocephalus. Mild stable age-related atrophy.  IMPRESSION: No acute intracranial abnormalities. Similar sinusitis when compared to the prior study, but with the interval development of mild acute sphenoid sinusitis.   Electronically Signed   By: Skipper Cliche M.D.   On: 12/04/2013 10:24     Medications: Scheduled Meds: . aspirin  300 mg Rectal Daily   Or  . aspirin  325 mg Oral Daily  . enoxaparin (LOVENOX) injection  40 mg Subcutaneous Q24H  . folic acid  1 mg Oral Daily  . metoCLOPramide (REGLAN) injection  10 mg Intravenous Once  . multivitamin with minerals  1 tablet Oral Daily  . nicotine  21 mg Transdermal Daily  . thiamine  100 mg Oral Daily   Or  . thiamine  100 mg Intravenous Daily      LOS: 1 day   Dayron Odland M.D. [facility] 12/05/2013, 2:17 PM Pager: [phone]  If 7PM-7AM, please contact night-coverage [email or web address] Password [email or web address]

## 2013-12-05 NOTE — Progress Notes (Signed)
Rehab Admissions Coordinator Note:  Patient was screened by Micalah Cabezas L for appropriateness for an Inpatient Acute Rehab Consult.  At this time, we are recommending Inpatient Rehab consult.  Toney Difatta, PT Rehabilitation Admissions Coordinator [phone]  

## 2013-12-05 NOTE — Evaluation (Signed)
Speech Language Pathology Evaluation Patient Details Name: Spencer Mendoza MRN: [id number] DOB: [birth year] Today's Date: 12/05/2013 Time:  -     Problem List:  Patient Active Problem List   Diagnosis Date Noted  . CVA (cerebral infarction) 12/04/2013  . Leukocytosis 12/04/2013  . Acute Gastroenteritis 12/04/2013  . Hypertension 12/04/2013  . Headache 12/04/2013  . Hemiparesis 12/04/2013  . Delirium tremens 07/29/2013  . Alcohol withdrawal 07/29/2013  . UGIB (upper gastrointestinal bleed) 07/29/2013  . Chest pain 07/28/2013  . Vomiting 07/28/2013   Past Medical History:  Past Medical History  Diagnosis Date  . Epilepsy   . Migraine    Past Surgical History:  Past Surgical History  Procedure Laterality Date  . Knee arthroscopy Right   . Appendectomy     HPI:  Spencer Mendoza is a 53 y.o. male who was brought to the emergency department at [facility] today, 12/04/2013, for evaluation of right hemiparesis and aphasia. The time of onset is unclear; although, the patient's wife noted that he had a droopy right eye sometime last evening. This morning she saw him sitting and watching television at around 5 AM but they did not speak and she did not see his face. At around 8 AM he came into their bedroom and told his wife that he did not feel well and needed to go to the emergency room. A CT scan showed no acute intracranial abnormalities. An MRI/MRA has been ordered. The patient was not on antithrombotic medications prior to admission. He took an occasional Goody's powder and Zyrtec for allergies but no scheduled medications. All of the history was obtained from the patient's wife and sister due to the patient's inability to speak. The patient will be admitted to for a full stroke workup   Assessment / Plan / Recommendation Clinical Impression  Patient with inconsistent evaluation.  Patient's receptive language is intact, with no difficulty following multiple commands.  Pt. is mostly  non-verbal, but occassionaly phonates when moving about in bed.  Patient is able to write phrases and sentences without delay with his left hand (shaky, as it's his non-dominant hand) to communicate basic needs.  He occassionally verbalized, with stuttering type speech.      SLP Assessment  Patient needs continued Speech Lanaguage Pathology Services    Follow Up Recommendations  Other (comment);24 hour supervision/assistance (TBD, depending on progress)    Frequency and Duration min 2x/week  1 week   Pertinent Vitals/Pain n/a   SLP Goals  SLP Goals Potential to Achieve Goals: Good Progress/Goals/Alternative treatment plan discussed with pt/caregiver and they: No caregivers available SLP Goal #1: Patient will complete various naming tasks with mod. assist SLP Goal #2: Patient will write responses to questions when he is unable to verbally express himself, with min assist. SLP Goal #3: Patient will repeat basic phrases to communicate basic needs with mod assist SLP Goal #4: Patient will complete automated sequences with min assist  SLP Evaluation Prior Functioning  Cognitive/Linguistic Baseline: Within functional limits  Lives With: Spouse Available Help at Discharge: Family   Cognition  Overall Cognitive Status: Difficult to assess Arousal/Alertness: Awake/alert Orientation Level: Oriented to person;Other (comment) Behaviors: Restless;Impulsive;Poor frustration tolerance Safety/Judgment: Impaired    Comprehension  Auditory Comprehension Overall Auditory Comprehension: Appears within functional limits for tasks assessed Yes/No Questions: Within Functional Limits Commands: Within Functional Limits Conversation: Simple Interfering Components: Pain (Grimmacing) Reading Comprehension Reading Status: Not tested    Expression Expression Primary Mode of Expression: Nonverbal - written Verbal Expression Overall  Verbal Expression: Impaired Initiation: Impaired Automatic Speech:   (unable-groping, stuttering type behaviors) Level of Generative/Spontaneous Verbalization: Word Repetition: Impaired Level of Impairment: Word level Naming: Impairment Non-Verbal Means of Communication: Writing;Gestures Written Expression Dominant Hand: Right Written Expression: Within Functional Limits   Oral / Motor Oral Motor/Sensory Function Overall Oral Motor/Sensory Function: Appears within functional limits for tasks assessed Motor Speech Overall Motor Speech: Impaired Respiration: Within functional limits Phonation: Normal (will not phonate on command) Articulation:  (unable to assess) Intelligibility: Intelligibility reduced Phrase:  (mostly non-verbal) Motor Planning: Impaired Level of Impairment: Word Motor Speech Errors: Groping for words;Inconsistent   GO     Quinn Axe T 12/05/2013, 6:22 PM

## 2013-12-05 NOTE — Progress Notes (Signed)
PT Cancellation Note  Patient Details Name: Spencer Mendoza MRN: [id number] DOB: 08-Nov-1961   Cancelled Treatment:    Reason Eval/Treat Not Completed: Other (comment) (pt on bedrest, attending paged.  Will wait for activity order to proceed).  Thanks,  Barbarann Ehlers. [place], [place], DPT [phone]   12/05/2013, 10:42 AM

## 2013-12-05 NOTE — Progress Notes (Signed)
Bilateral carotid artery duplex completed.  Right:  40-59% internal carotid artery stenosis, probably due to vessel tortuosity.  Left:  1-39% ICA stenosis.  Bilateral:  Vertebral artery flow is antegrade.

## 2013-12-05 NOTE — Evaluation (Signed)
Physical Therapy Evaluation Patient Details Name: Spencer Mendoza MRN: [id number] DOB: 05/07/1961 Today's Date: 12/05/2013 Time: 5573-2202 PT Time Calculation (min): 34 min  PT Assessment / Plan / Recommendation History of Present Illness  53 y.o. male admitted to [facility] on 12/04/13 with history of migraine, alcohol abuse, epilepsy that presents to the emergency department with right hemiperesis, aphasia, and headache.    Clinical Impression  Pt is tearful and frustrated during my session with him.  Mod assist to stand and transfer to recliner chair on his right side.  I was unable to get a thorough history because of his expressive difficulties.  There were some inconsistencies in his MMT ability and his ability to take weight on his leg during the transfer.  He is very motivated to get better and would be an excellent rehab candidate.      PT Assessment  Patient needs continued PT services    Follow Up Recommendations  CIR    Does the patient have the potential to tolerate intense rehabilitation     Yes  Barriers to Discharge   Have not spoken to family about assist at discharge.        Equipment Recommendations  Wheelchair (measurements PT);Wheelchair cushion (measurements PT);Rolling walker with 5" wheels;Other (comment) (R PFRW)    Recommendations for Other Services   NA  Frequency Min 4X/week    Precautions / Restrictions Precautions Precautions: Fall Precaution Comments: right sided weakness Restrictions Weight Bearing Restrictions: No   Pertinent Vitals/Pain See vitals flow sheet.       Mobility  Bed Mobility Overal bed mobility: Needs Assistance Bed Mobility: Supine to Sit Supine to sit: Min assist General bed mobility comments: min assist to support trunk during transitions.  Extra time needed to struggle to sitting.  Pt holding his breath during mobility, encouraged to breathe (educated on pursed lip breathing).   Transfers Overall transfer level: Needs  assistance Transfers: Sit to/from Stand;Stand Pivot Transfers Sit to Stand: Mod assist Stand pivot transfers: Mod assist General transfer comment: mod assist to support trunk for balance in standing.  Took two attempts to stand to be stable enough to pivot.  Interestingly enough despite poor MMT both in bed and in sitting EOB, pt did not hyperextend or buckle the way I anticipated on the right leg during standing or during the transfer.   Modified Rankin (Stroke Patients Only) Pre-Morbid Rankin Score: No symptoms Modified Rankin: Moderately severe disability        PT Diagnosis: Difficulty walking;Abnormality of gait;Generalized weakness;Acute pain;Hemiplegia dominant side  PT Problem List: Decreased strength;Decreased balance;Decreased activity tolerance;Decreased mobility;Decreased coordination;Decreased knowledge of use of DME;Impaired sensation;Pain;Impaired tone PT Treatment Interventions: DME instruction;Gait training;Stair training;Functional mobility training;Therapeutic exercise;Therapeutic activities;Balance training;Neuromuscular re-education;Cognitive remediation;Patient/family education     PT Goals(Current goals can be found in the care plan section) Acute Rehab PT Goals Patient Stated Goal: to go home PT Goal Formulation: With patient Time For Goal Achievement: 12/19/13 Potential to Achieve Goals: Good  Visit Information  Last PT Received On: 12/05/13 Assistance Needed: +2 (for safety when attempting gait. ) Reason Eval/Treat Not Completed: Other (comment) (pt on bedrest, attending paged.  Will wait for activity ) History of Present Illness: 53 y.o. male admitted to [facility] on 12/04/13 with history of migraine, alcohol abuse, epilepsy that presents to the emergency department with right hemiperesis, aphasia, and headache.         Prior Carl expects to be discharged to:: Private residence Living Arrangements: Spouse/significant other  (  [name]) Available Help at Discharge: Family  Lives With: Spouse Prior Function Level of Independence: Independent Communication Communication: Expressive difficulties Dominant Hand: Right    Cognition  Cognition Arousal/Alertness: Awake/alert Behavior During Therapy:  (frustrated by difficulty communicating. ) Overall Cognitive Status: Difficult to assess Difficult to assess due to:  (expressive difficulties. )    Extremity/Trunk Assessment Upper Extremity Assessment Upper Extremity Assessment: Defer to OT evaluation Lower Extremity Assessment Lower Extremity Assessment: RLE deficits/detail RLE Deficits / Details: ankle DF 2/5, knee extension 2+/5, hip flexion 2+/5 RLE Sensation: decreased light touch RLE Coordination: decreased fine motor;decreased gross motor Cervical / Trunk Assessment Cervical / Trunk Assessment: Other exceptions Cervical / Trunk Exceptions: In sitting trunk leaned to ther right.     Balance Balance Overall balance assessment: Needs assistance Sitting-balance support: Feet supported;Single extremity supported Sitting balance-Leahy Scale: Fair Standing balance support: Single extremity supported Standing balance-Leahy Scale: Poor General Comments General comments (skin integrity, edema, etc.): Pt using his left hand and note pad to communicate his thoughts and needs.    End of Session PT - End of Session Equipment Utilized During Treatment: Gait belt Activity Tolerance: Patient limited by fatigue Patient left: in chair;with call bell/phone within reach;with chair alarm set Nurse Communication: Mobility status    Wells Guiles B. [place], [place], DPT [phone]   12/05/2013, 1:03 PM

## 2013-12-05 NOTE — Consult Note (Signed)
Physical Medicine and Rehabilitation Consult  Reason for Consult: Speech difficulties, facial droop, right sided weakness,  Referring Physician:  Dr. Tana Coast.    HPI: GEREMY RISTER is a 53 y.o. male with history of alcohol withdrawal seizures, alcoholic gastritis with UGIB 07/2013, polysubstance abuse;  who was admitted on 11/24/13 with vomiting, diarrhea, facial droop, headaches and weakness. He was found to have aphasia as well as right sided weakness. CT head without acute changes. Patient with leucocytosis like due to gastroenteritis and started on IVF as well as antiemetics. ETOH level 110 and UDS positive for THC. He was evaluated by Neurology who felt that patient with some functional features--MRI brain ordered with question complicated migraine v/s seizure.  EEG without seizure activity. ST evaluation with atypical presentation--D1, thin liquids recommended due to struggle type behaviors. PT evaluation done yesterday and CIR recommended for follow up. MRI/MRA brain without evidence of acute/subacute infarct, no stenosis and diffuse sinus disease. Neurology feels that patient's symptoms likely due to complicated migraine.   ROS  Past Medical History  Diagnosis Date  . Epilepsy   . Migraine    Past Surgical History  Procedure Laterality Date  . Knee arthroscopy Right   . Appendectomy     Family History  Problem Relation Age of Onset  . Heart disease Mother   . Heart disease Father     Social History:  Lives with girlfriend. Per  reports that he has been smoking 1 PPD.  He does not have any smokeless tobacco history on file. He reports that he drinks alcohol--4-5 beers daily.  He reports that he does not use illicit drugs.  Allergies: No Known Allergies  Medications Prior to Admission  Medication Sig Dispense Refill  . Aspirin-Acetaminophen-Caffeine (GOODY HEADACHE PO) Take 1 packet by mouth daily as needed. For pain      . cetirizine (ZYRTEC) 10 MG tablet Take 10 mg by mouth  daily.      . Multiple Vitamins-Minerals (MULTIVITAMIN WITH MINERALS) tablet Take 1 tablet by mouth daily.        Home: Home Living Family/patient expects to be discharged to:: Private residence Living Arrangements: Spouse/significant other [name]) Available Help at Discharge: Family  Lives With: Spouse  Functional History:   Functional Status:  Mobility:          ADL:    Cognition: Cognition Overall Cognitive Status: Difficult to assess Arousal/Alertness: Awake/alert Orientation Level: Oriented to person;Other (comment) (difficult to assess-expressive aphasia) Behaviors: Restless;Impulsive;Poor frustration tolerance Safety/Judgment: Impaired Cognition Arousal/Alertness: Awake/alert Behavior During Therapy:  (frustrated by difficulty communicating. ) Overall Cognitive Status: Difficult to assess Difficult to assess due to:  (expressive difficulties. )  Blood pressure 130/79, pulse 54, temperature 96.7 F (35.9 C), temperature source Oral, resp. rate 16, height 6\' 1"  (1.854 m), weight 79.652 kg (175 lb 9.6 oz), SpO2 94.00%. Physical Exam  Results for orders placed during the hospital encounter of 12/04/13 (from the past 24 hour(s))  GLUCOSE, CAPILLARY     Status: None   Collection Time    12/05/13  8:18 PM      Result Value Range   Glucose-Capillary 96  70 - 99 mg/dL   Mr Brain Wo Contrast  12/06/2013   CLINICAL DATA:  Stroke like symptoms. Right hemiparesis and aphasia.  The examination had to be discontinued prior to completion due to patient refusal.  EXAM: MRI HEAD WITHOUT CONTRAST  MRA HEAD WITHOUT CONTRAST  TECHNIQUE: Multiplanar, multiecho pulse sequences of the brain and surrounding structures were obtained  without intravenous contrast. Angiographic images of the head were obtained using MRA technique without contrast.  COMPARISON:  CT head without contrast 12/04/2013  FINDINGS: MRI HEAD FINDINGS  The diffusion-weighted images demonstrate no evidence for acute  or subacute infarction. No hemorrhage or mass lesion is present. The ventricles are of normal size. No significant extra-axial fluid collection is present.  Flow is present in the major intracranial arteries. The globes and orbits are intact.  A fluid level is present in the left sphenoid sinus. Mucosal thickening is present in the right sphenoid sinus, bilateral ethmoid air cells, frontal sinuses, and maxillary sinuses. There is a tiny fluid level in the right maxillary sinus. Minimal fluid is present in the mastoid air cells bilaterally. No obstructing nasopharyngeal lesion is evident.  MRA HEAD FINDINGS  The internal carotid arteries are within normal limits from the high cervical segments through the ICA termini bilaterally. The A1 and M1 segments are normal. A tiny anterior communicating artery is present. The MCA bifurcations are within normal limits. The ACA and MCA branch vessels are normal.  The vertebral arteries are codominant. The right PICA origin is visualized and normal. Prominent AICA vessels bilaterally. The basilar artery is normal. Both posterior cerebral arteries originate from the basilar tip. The PCA branch vessels are unremarkable.  IMPRESSION: 1. Normal MRI appearance of the brain. 2. Diffuse sinus disease with fluid levels in the left sphenoid sinus and right maxillary sinus suggesting acute disease. 3. Normal variant MRA circle of Willis without evidence for significant proximal stenosis, aneurysm, or branch vessel occlusion.   Electronically Signed   By: Lawrence Santiago M.D.   On: 12/06/2013 09:32   Mr Jodene Nam Head/brain Wo Cm  12/06/2013   CLINICAL DATA:  Stroke like symptoms. Right hemiparesis and aphasia.  The examination had to be discontinued prior to completion due to patient refusal.  EXAM: MRI HEAD WITHOUT CONTRAST  MRA HEAD WITHOUT CONTRAST  TECHNIQUE: Multiplanar, multiecho pulse sequences of the brain and surrounding structures were obtained without intravenous contrast.  Angiographic images of the head were obtained using MRA technique without contrast.  COMPARISON:  CT head without contrast 12/04/2013  FINDINGS: MRI HEAD FINDINGS  The diffusion-weighted images demonstrate no evidence for acute or subacute infarction. No hemorrhage or mass lesion is present. The ventricles are of normal size. No significant extra-axial fluid collection is present.  Flow is present in the major intracranial arteries. The globes and orbits are intact.  A fluid level is present in the left sphenoid sinus. Mucosal thickening is present in the right sphenoid sinus, bilateral ethmoid air cells, frontal sinuses, and maxillary sinuses. There is a tiny fluid level in the right maxillary sinus. Minimal fluid is present in the mastoid air cells bilaterally. No obstructing nasopharyngeal lesion is evident.  MRA HEAD FINDINGS  The internal carotid arteries are within normal limits from the high cervical segments through the ICA termini bilaterally. The A1 and M1 segments are normal. A tiny anterior communicating artery is present. The MCA bifurcations are within normal limits. The ACA and MCA branch vessels are normal.  The vertebral arteries are codominant. The right PICA origin is visualized and normal. Prominent AICA vessels bilaterally. The basilar artery is normal. Both posterior cerebral arteries originate from the basilar tip. The PCA branch vessels are unremarkable.  IMPRESSION: 1. Normal MRI appearance of the brain. 2. Diffuse sinus disease with fluid levels in the left sphenoid sinus and right maxillary sinus suggesting acute disease. 3. Normal variant MRA circle of  Willis without evidence for significant proximal stenosis, aneurysm, or branch vessel occlusion.   Electronically Signed   By: Lawrence Santiago M.D.   On: 12/06/2013 09:32    Assessment/Plan: 1. Diagnosis: weakness of unknown origin 2. Does the need for close, 24 hr/day medical supervision in concert with the patient's rehab needs make  it unreasonable for this patient to be served in a less intensive setting? No 3. Co-Morbidities requiring supervision/potential complications:  - 4. Due to bladder management, bowel management, safety and disease management, does the patient require 24 hr/day rehab nursing? No 5. Does the patient require coordinated care of a physician, rehab nurse, PT, OT to address physical and functional deficits in the context of the above medical diagnosis(es)? No Addressing deficits in the following areas: balance, endurance, locomotion, strength, bowel/bladder control and dressing 6. Can the patient actively participate in an intensive therapy program of at least 3 hrs of therapy per day at least 5 days per week? No 7. The potential for patient to make measurable gains while on inpatient rehab is poor 8. Anticipated functional outcomes upon discharge from inpatient rehab are n/a with PT, n/a with OT, n/a with SLP. 9. Estimated rehab length of stay to reach the above functional goals is: n/a 10. Does the patient have adequate social supports to accommodate these discharge functional goals? n/a 11. Anticipated D/C setting: Home 12. Anticipated post D/C treatments: [place] therapy 13. Overall Rehab/Functional Prognosis: good  RECOMMENDATIONS: This patient's condition is appropriate for continued rehabilitative care in the following setting: [facility] Patient has agreed to participate in recommended program. Potentially Note that insurance prior authorization may be required for reimbursement for recommended care.  Comment: No clear diagnosis. Work up essentially negative for neurological event. Therapy recommending HH---I concur.   Meredith Staggers, MD, [place] [facility]     12/06/2013

## 2013-12-06 ENCOUNTER — Encounter (HOSPITAL_COMMUNITY): Payer: Self-pay | Admitting: Physical Medicine and Rehabilitation

## 2013-12-06 ENCOUNTER — Inpatient Hospital Stay (HOSPITAL_COMMUNITY): Payer: Medicaid Other

## 2013-12-06 DIAGNOSIS — F10231 Alcohol dependence with withdrawal delirium: Secondary | ICD-10-CM

## 2013-12-06 DIAGNOSIS — F10931 Alcohol use, unspecified with withdrawal delirium: Secondary | ICD-10-CM

## 2013-12-06 DIAGNOSIS — G8190 Hemiplegia, unspecified affecting unspecified side: Secondary | ICD-10-CM

## 2013-12-06 LAB — BASIC METABOLIC PANEL
BUN: 7 mg/dL (ref 6–23)
CO2: 21 mEq/L (ref 19–32)
Calcium: 8.8 mg/dL (ref 8.4–10.5)
Chloride: 97 mEq/L (ref 96–112)
Creatinine, Ser: 0.72 mg/dL (ref 0.50–1.35)
GFR calc Af Amer: >90 mL/min (ref >90)
GFR calc non Af Amer: >90 mL/min (ref >90)
Glucose, Bld: 81 mg/dL (ref 70–99)
Potassium: 3.6 mEq/L — ABNORMAL LOW (ref 3.7–5.3)
Sodium: 137 mEq/L (ref 137–147)

## 2013-12-06 LAB — GLUCOSE, CAPILLARY: Glucose-Capillary: 96 mg/dL (ref 70–99)

## 2013-12-06 MED ORDER — LORAZEPAM 2 MG/ML IJ SOLN
INTRAMUSCULAR | Status: AC
  Administered 2013-12-06: 2 mg
  Filled 2013-12-06: qty 1

## 2013-12-06 MED ORDER — STROKE: EARLY STAGES OF RECOVERY BOOK
Freq: Once | Status: AC
  Administered 2013-12-06: 08:00:00
  Filled 2013-12-06: qty 1

## 2013-12-06 MED ORDER — LORAZEPAM 2 MG/ML IJ SOLN
1.0000 mg | Freq: Once | INTRAMUSCULAR | Status: AC
  Administered 2013-12-06: 1 mg via INTRAVENOUS

## 2013-12-06 MED ORDER — TRAMADOL HCL 50 MG PO TABS
50.0000 mg | ORAL_TABLET | Freq: Four times a day (QID) | ORAL | Status: DC | PRN
  Administered 2013-12-06 – 2013-12-07 (×3): 50 mg via ORAL
  Filled 2013-12-06 (×4): qty 1

## 2013-12-06 NOTE — Progress Notes (Signed)
Occupational Therapy Evaluation Patient Details Name: Spencer Mendoza MRN: [id number] DOB: [birth year] Today's Date: 12/06/2013 Time: 7628-3151 OT Time Calculation (min): 34 min  OT Assessment / Plan / Recommendation History of present illness 53 y.o. male admitted to [facility] on 12/04/13 with history of migraine, alcohol abuse, epilepsy that presents to the emergency department with right hemiperesis, aphasia, and headache.     Clinical Impression   PTA, pt lived with wife and was independent with ADL. Does not work. Pt presents with R hemiparesis. Noted inconsistencies during assessment regarding functional use of RUE and deficits. Pt with isolated finger movement and able to write name with R hand. Unable to lift RUE to mouth with 2/5 elbow flex/ext, however, able to use RUE with RW to support body weight as he would not put his R foot on floor. Due to deficits pt demonstrates during assessment, do not feel pt is safe to D/C home and recommend SNF for rehab. Recommended to nurse to move pt to camera room.  Pt will benefit from skilled OT services to facilitate D/C to next venue due to below deficits.     OT Assessment  Patient needs continued OT Services    Follow Up Recommendations  SNF    Barriers to Discharge      Equipment Recommendations  Other (comment) (TBD by SNF)    Recommendations for Other Services  psych consult  Frequency  Min 2X/week    Precautions / Restrictions Precautions Precautions: Fall Precaution Comments: right sided weakness   Pertinent Vitals/Pain C/o headache    ADL  Eating/Feeding: Set up Where Assessed - Eating/Feeding: Edge of bed Grooming: Set up Where Assessed - Grooming: Unsupported sitting Upper Body Bathing: Minimal assistance Where Assessed - Upper Body Bathing: Unsupported sitting Lower Body Bathing: Minimal assistance Where Assessed - Lower Body Bathing: Supported sit to stand Upper Body Dressing: Minimal assistance Where Assessed - Upper  Body Dressing: Unsupported sitting Lower Body Dressing: Minimal assistance Where Assessed - Lower Body Dressing: Supported sit to stand Toilet Transfer: Minimal assistance Toilet Transfer Method: Sit to stand Transfers/Ambulation Related to ADLs: mi A sit - stand. Pt with excellent balance on L foot. would not stand on R foot    OT Diagnosis: Generalized weakness;Cognitive deficits;Hemiplegia dominant side  OT Problem List: Decreased strength;Decreased range of motion;Decreased activity tolerance;Impaired balance (sitting and/or standing);Decreased coordination;Decreased cognition;Decreased safety awareness;Decreased knowledge of use of DME or AE;Impaired sensation;Impaired UE functional use OT Treatment Interventions: Self-care/ADL training;Therapeutic exercise;Neuromuscular education;DME and/or AE instruction;Therapeutic activities;Cognitive remediation/compensation;Patient/family education;Balance training   OT Goals(Current goals can be found in the care plan section) Acute Rehab OT Goals Patient Stated Goal: to go home OT Goal Formulation: With patient Time For Goal Achievement: 12/20/13 Potential to Achieve Goals: Fair  Visit Information  Last OT Received On: 12/06/13 Assistance Needed: +2 (for safety when attempting gait. ) History of Present Illness: 53 y.o. male admitted to [facility] on 12/04/13 with history of migraine, alcohol abuse, epilepsy that presents to the emergency department with right hemiperesis, aphasia, and headache.    Nursing states that pt is trying to get disability as they are going to lose their house and have to move in with family if they do not get disability       Prior [place] expects to be discharged to:: Private residence Living Arrangements: Spouse/significant other Spencer Mendoza) Available Help at Discharge: Family Type of Home: House Home Access: Stairs to enter [employer] of Steps: [place]:  One  level Home Equipment: None  Lives With: Spouse Prior Function Level of Independence: Independent Comments: doesnot work Corporate investment banker: Expressive difficulties Dominant Hand: Right         Vision/Perception Vision - History Baseline Vision: No visual deficits Patient Visual Report: Blurring of vision Vision - Assessment Eye Alignment: Within Functional Limits Vision Assessment: Vision tested Ocular Range of Motion: Within Functional Limits Alignment/Gaze Preference: Within Defined Limits Tracking/Visual Pursuits: Able to track stimulus in all quads without difficulty Saccades: Within functional limits Convergence: Within functional limits Perception Perception: Within Functional Limits Praxis Praxis: Intact   Cognition  Cognition Arousal/Alertness: Awake/alert Behavior During Therapy: Flat affect (frustrated by difficulty communicating. ) Overall Cognitive Status: Impaired/Different from baseline Area of Impairment: Orientation;Safety/judgement;Awareness;Problem solving Orientation Level: Disoriented to;Time Safety/Judgement: Decreased awareness of safety Awareness: Emergent Problem Solving: Slow processing;Difficulty sequencing;Requires verbal cues;Requires tactile cues Difficult to assess due to:  (expressive difficulties. )    Extremity/Trunk Assessment Upper Extremity Assessment Upper Extremity Assessment: RUE deficits/detail RUE Deficits / Details: Inconsistencies with RUE movement during assessment. able to domplete @ 30 FF. no abduction/Able to ER to touch R hip. Isolated fingermovement. Able to write name (normal tone. no synergy observed when yawning) RUE Sensation: decreased light touch;decreased proprioception RUE Coordination: decreased fine motor;decreased gross motor Lower Extremity Assessment Lower Extremity Assessment: RLE deficits/detail RLE Deficits / Details: knee ext 3/5. knee flex 3/5. hip flexion 2/5 RLE Sensation: decreased light  touch RLE Coordination: decreased fine motor;decreased gross motor Cervical / Trunk Assessment Cervical / Trunk Assessment: Normal (no leaning noted) Cervical / Trunk Exceptions: good postural control in static standing     Mobility Bed Mobility Overal bed mobility: Needs Assistance Bed Mobility: Supine to Sit;Sit to Supine Supine to sit: Supervision Sit to supine: Min assist General bed mobility comments: PT struggling to sit up from supine. vc for sidelying to sit Transfers Overall transfer level: Needs assistance Sit to Stand: Min assist Stand pivot transfers: Mod assist General transfer comment: PT would not put RLE on floor. kept holding in air. gave input to R knee and extended. Pt able to keep foot on floor, but significant trunk flexion anteriorly when trying to take a step     Exercise Other Exercises Other Exercises: encouraged functional use RUE   Balance Balance Overall balance assessment: Needs assistance Sitting-balance support: No upper extremity supported;Feet supported Sitting balance-Leahy Scale: Good Postural control:  (normal) Standing balance support: Bilateral upper extremity supported Standing balance-Leahy Scale: Poor   End of Session OT - End of Session Activity Tolerance: Patient tolerated treatment well Patient left: in bed;with call bell/phone within reach;with bed alarm set;with family/visitor present Nurse Communication: Mobility status  GO     Nitya Cauthon,HILLARY 12/06/2013, 4:47 PM [facility], OTR/L  [phone] 12/06/2013

## 2013-12-06 NOTE — Progress Notes (Signed)
Spoke with MRI in regards to PT still in need of MRI per prior order.  Stated would try to get it done tonight. Jessie Foot, RN

## 2013-12-06 NOTE — Progress Notes (Signed)
Patient ID: Spencer Mendoza  male  MCN:[id number]    DOB: 02-19-1961    DOA: 12/04/2013  PCP: No PCP Per Patient  Assessment/Plan:  Right-sided hemophoresis with aphasia/dysarthria:  complex atypical migraine versus some functional features- right-sided weakness improving - CT scan did not show any acute intracranial abnormalities  - MRI/MRA brain showed no acute CVA, normal gradient MRA circle of Willis - Urine tox screen positive for THC  - EEG  was normal, rule out acute seizures - PT evaluation recommending CIR - Carotid Doppler showed a 40-59% ICA stenosis on right, left 1-39% - 2-D echo showed EF of 60-65%, normal wall motion  - Currently on dysphagia 1 diet, will have speech therapy evaluated the  Acute gastroenteritis  - Continue IV fluids, antiemetics, improving   Leukocytosis likely secondary to gastroenteritis versus acute phase reactant  - Resolved, continue IV fluids  History of alcoholism  -EtOH level 110,  monitor for alcohol withdrawal   Hypertension  Stable  History of migraine headaches  - Currently stable  DVT Prophylaxis: Lovenox  Code Status: Full code  Family Communication:Discussed with patient's multiple family members in the room  Disposition:Likely will need rehabilitation    Subjective:  patient notes that the right-sided weakness is improving, still has dysarthria  Objective: Weight change:   Intake/Output Summary (Last 24 hours) at 12/06/13 1229 Last data filed at 12/06/13 0600  Gross per 24 hour  Intake   1425 ml  Output      0 ml  Net   1425 ml   Blood pressure 145/77, pulse 82, temperature 97.2 F (36.2 C), temperature source Oral, resp. rate 17, height 6\' 1"  (1.854 m), weight 79.652 kg (175 lb 9.6 oz), SpO2 94.00%.  Physical Exam: General: Alert and awake, ? Dysarthria  CVS: S1-S2 clear, no murmur rubs or gallops Chest:  CTA B.  Abdomen: soft nontender, nondistended, normal bowel sounds  Extremities: no cyanosis, clubbing  or edema noted bilaterally Neuro:  Dysarthria, right-sided hemiparesis poor effort (?)  Lab Results: Basic Metabolic Panel:  Recent Labs Lab 12/05/13 0450 12/06/13 1035  NA 140 137  K 3.9 3.6*  CL 101 97  CO2 16* 21  GLUCOSE 61* 81  BUN 12 7  CREATININE 0.93 0.72  CALCIUM 8.9 8.8   Liver Function Tests:  Recent Labs Lab 12/04/13 1035  AST 25  ALT 19  ALKPHOS 82  BILITOT 0.8  PROT 7.6  ALBUMIN 4.4   No results found for this basename: LIPASE, AMYLASE,  in the last 168 hours No results found for this basename: AMMONIA,  in the last 168 hours CBC:  Recent Labs Lab 12/04/13 1035 12/05/13 0450  WBC 14.1* 6.9  NEUTROABS 10.0*  --   HGB 17.6* 16.3  HCT 48.0 45.9  MCV 94.5 96.4  PLT 281 248   Cardiac Enzymes:  Recent Labs Lab 12/04/13 1035  TROPONINI <0.30   BNP: No components found with this basename: POCBNP,  CBG:  Recent Labs Lab 12/04/13 0949 12/05/13 2018  GLUCAP 96 96     Micro Results: No results found for this or any previous visit (from the past 240 hour(s)).  Studies/Results: Ct Head Wo Contrast  12/04/2013   CLINICAL DATA:  Left facial droop, right-sided weakness, slurred speech, left-sided headache, altered mental status  EXAM: CT HEAD WITHOUT CONTRAST  TECHNIQUE: Contiguous axial images were obtained from the base of the skull through the vertex without intravenous contrast.  COMPARISON:  11/03/2010  FINDINGS: Mild mucosal periosteal  thickening of the maxillary sinuses. Multifocal complete ethmoid air cell opacification involving scattered bilateral anterior and frontal ethmoid air cells. Frontal sinuses clear. Small air-fluid level in the sphenoid sinus. Calvarium is intact.  Attenuation is normal with no evidence of hemorrhage, infarct, extra-axial fluid, or mass. No hydrocephalus. Mild stable age-related atrophy.  IMPRESSION: No acute intracranial abnormalities. Similar sinusitis when compared to the prior study, but with the interval  development of mild acute sphenoid sinusitis.   Electronically Signed   By: Skipper Cliche M.D.   On: 12/04/2013 10:24    Medications: Scheduled Meds: . aspirin  300 mg Rectal Daily   Or  . aspirin  325 mg Oral Daily  . enoxaparin (LOVENOX) injection  40 mg Subcutaneous Q24H  . folic acid  1 mg Oral Daily  . metoCLOPramide (REGLAN) injection  10 mg Intravenous Once  . multivitamin with minerals  1 tablet Oral Daily  . nicotine  21 mg Transdermal Daily  . thiamine  100 mg Oral Daily   Or  . thiamine  100 mg Intravenous Daily      LOS: 2 days   RAI,RIPUDEEP M.D. [facility] 12/06/2013, 12:29 PM Pager: [phone]  If 7PM-7AM, please contact night-coverage [email or web address] Password [email or web address]

## 2013-12-06 NOTE — Progress Notes (Signed)
Speech Language Pathology Treatment: Dysphagia;Cognitive-Linquistic  Patient Details Name: Spencer Mendoza MRN: [id number] DOB: 1961-06-26 Today's Date: 12/06/2013 Time: 1217-1230 SLP Time Calculation (min): 13 min  Assessment / Plan / Recommendation Clinical Impression  Pt. initally did not verbalize during this session, nodding and gesturing, but with encouragement, he begain to answer questions in short phrases, initially with stuttering-type speech, and intermittently whispering.  When asked to speak out loud, pt. Was able to phonate normally, and the dysfluent speech resolved. By the end of the session, pt. Was answering questions in full sentences, without difficulty (other than intermittent whispering).  Swallow function was reassessed with peanut butter graham crackers and soda via straw.  Pt. Was able to chew and swallow normally, with none of the gagging, grimmacing, or struggle behaviors noted yesterday.  Pt. Was holding the cracker in his left hand, and SLP offered the soda to his right hand, which he accepted, gripped the cup without difficulty and raised cup/straw to his mouth and drank.  No difficulty noted today with use of straw.      HPI HPI: Spencer Mendoza is a 53 y.o. male who was brought to the emergency department at [facility] today, 12/04/2013, for evaluation of right hemiparesis and aphasia. The time of onset is unclear; although, the patient's wife noted that he had a droopy right eye sometime last evening. This morning she saw him sitting and watching television at around 5 AM but they did not speak and she did not see his face. At around 8 AM he came into their bedroom and told his wife that he did not feel well and needed to go to the emergency room. A CT scan showed no acute intracranial abnormalities. An MRI/MRA has been ordered. The patient was not on antithrombotic medications prior to admission. He took an occasional Goody's powder and Zyrtec for allergies but no  scheduled medications. All of the history was obtained from the patient's wife and sister due to the patient's inability to speak. The patient will be admitted to for a full stroke workup   Pertinent Vitals MRI negative for acute stroke.  SLP Plan  Discharge SLP treatment due to (comment);All goals met    Recommendations Diet recommendations: Regular;Thin liquid Liquids provided via: Straw Medication Administration: Whole meds with liquid Supervision: Patient able to self feed Compensations: Small sips/bites;Slow rate Postural Changes and/or Swallow Maneuvers: Seated upright 90 degrees       Speech & swallowing issues presented yesterday are resolving. No f/u ST is indicated.       Plan: Discharge SLP treatment due to (comment);All goals met    GO     Quinn Axe T 12/06/2013, 12:48 PM

## 2013-12-06 NOTE — Progress Notes (Signed)
Out for MRI at this time. Will follow up later to complete consult.

## 2013-12-06 NOTE — Progress Notes (Signed)
Subjective: Patient awake and alert.  Continues to have right sided weakness.  Wife feels that he has swelling at his left temple as well.  Is speaking some better.    Objective: Current vital signs: BP 130/79  Pulse 54  Temp(Src) 96.7 F (35.9 C) (Oral)  Resp 16  Ht 6\' 1"  (1.854 m)  Wt 79.652 kg (175 lb 9.6 oz)  BMI 23.17 kg/m2  SpO2 94% Vital signs in last 24 hours: Temp:  [96.7 F (35.9 C)-98.7 F (37.1 C)] 96.7 F (35.9 C) (01/20 0521) Pulse Rate:  [54-81] 54 (01/20 0521) Resp:  [16] 16 (01/19 1243) BP: (130-147)/(79-91) 130/79 mmHg (01/20 0521) SpO2:  [94 %-97 %] 94 % (01/20 0521)  Intake/Output from previous day: 01/19 0701 - 01/20 0700 In: 2682.5 [I.V.:2682.5] Out: 300 [Urine:300] Intake/Output this shift:   Nutritional status: Dysphagia  Neurologic Exam: Mental Status:  The patient is alert.  Able to say "hello" normally.  He is able to follow 3 step commands without difficulty..  Cranial Nerves:  II: Discs not visualized; Visual fields grossly normal, pupils equal, round, reactive to light and accommodation  III,IV, VI: Right ptosis present, extra-ocular motions intact bilaterally  V,VII: Right lower facial droop and decreased sensation to light touch on the right side of his face  VIII: hearing normal bilaterally  IX,X: gag reflex present  XI: bilateral shoulder shrug  XII: tongue deviates to the right he appears to have difficulty moving it from side to side.  Motor:  Right : Upper extremity 4/5 with drift     Left:Upper extremity Able to lift the arm off the bed a few inches but when I lift his arm to his head he is able to maintain  Lower extremity 4/5       Lower extremity 5/5  Tone and bulk:normal tone throughout; no atrophy noted  Sensory: sensation decreased to light touch in the right upper and lower extremities.  Deep Tendon Reflexes: 2+ and symmetric throughout  Plantars:  Right: downgoing      Left: downgoing   Lab Results: Basic Metabolic  Panel:  Recent Labs Lab 12/04/13 1035 12/05/13 0450  NA 139 140  K 3.8 3.9  CL 98 101  CO2 20 16*  GLUCOSE 82 61*  BUN 6 12  CREATININE 0.81 0.93  CALCIUM 9.5 8.9    Liver Function Tests:  Recent Labs Lab 12/04/13 1035  AST 25  ALT 19  ALKPHOS 82  BILITOT 0.8  PROT 7.6  ALBUMIN 4.4   No results found for this basename: LIPASE, AMYLASE,  in the last 168 hours No results found for this basename: AMMONIA,  in the last 168 hours  CBC:  Recent Labs Lab 12/04/13 1035 12/05/13 0450  WBC 14.1* 6.9  NEUTROABS 10.0*  --   HGB 17.6* 16.3  HCT 48.0 45.9  MCV 94.5 96.4  PLT 281 248    Cardiac Enzymes:  Recent Labs Lab 12/04/13 1035  TROPONINI <0.30    Lipid Panel:  Recent Labs Lab 12/05/13 0450  CHOL 174  TRIG 283*  HDL 55  CHOLHDL 3.2  VLDL 57*  LDLCALC 62    CBG:  Recent Labs Lab 12/04/13 0949 12/05/13 2018  GLUCAP 96 96    Microbiology: Results for orders placed during the hospital encounter of 07/28/13  MRSA PCR SCREENING     Status: None   Collection Time    07/29/13  8:44 PM      Result Value Range Status  MRSA by PCR NEGATIVE  NEGATIVE Final   Comment:            The GeneXpert MRSA Assay (FDA     approved for NASAL specimens     only), is one component of a     comprehensive MRSA colonization     surveillance program. It is not     intended to diagnose MRSA     infection nor to guide or     monitor treatment for     MRSA infections.    Coagulation Studies:  Recent Labs  12/04/13 1035  LABPROT 11.8  INR 0.88    Imaging: Mr Brain Wo Contrast  12/06/2013   CLINICAL DATA:  Stroke like symptoms. Right hemiparesis and aphasia.  The examination had to be discontinued prior to completion due to patient refusal.  EXAM: MRI HEAD WITHOUT CONTRAST  MRA HEAD WITHOUT CONTRAST  TECHNIQUE: Multiplanar, multiecho pulse sequences of the brain and surrounding structures were obtained without intravenous contrast. Angiographic images of  the head were obtained using MRA technique without contrast.  COMPARISON:  CT head without contrast 12/04/2013  FINDINGS: MRI HEAD FINDINGS  The diffusion-weighted images demonstrate no evidence for acute or subacute infarction. No hemorrhage or mass lesion is present. The ventricles are of normal size. No significant extra-axial fluid collection is present.  Flow is present in the major intracranial arteries. The globes and orbits are intact.  A fluid level is present in the left sphenoid sinus. Mucosal thickening is present in the right sphenoid sinus, bilateral ethmoid air cells, frontal sinuses, and maxillary sinuses. There is a tiny fluid level in the right maxillary sinus. Minimal fluid is present in the mastoid air cells bilaterally. No obstructing nasopharyngeal lesion is evident.  MRA HEAD FINDINGS  The internal carotid arteries are within normal limits from the high cervical segments through the ICA termini bilaterally. The A1 and M1 segments are normal. A tiny anterior communicating artery is present. The MCA bifurcations are within normal limits. The ACA and MCA branch vessels are normal.  The vertebral arteries are codominant. The right PICA origin is visualized and normal. Prominent AICA vessels bilaterally. The basilar artery is normal. Both posterior cerebral arteries originate from the basilar tip. The PCA branch vessels are unremarkable.  IMPRESSION: 1. Normal MRI appearance of the brain. 2. Diffuse sinus disease with fluid levels in the left sphenoid sinus and right maxillary sinus suggesting acute disease. 3. Normal variant MRA circle of Willis without evidence for significant proximal stenosis, aneurysm, or branch vessel occlusion.   Electronically Signed   By: Lawrence Santiago M.D.   On: 12/06/2013 09:32   Mr Jodene Nam Head/brain Wo Cm  12/06/2013   CLINICAL DATA:  Stroke like symptoms. Right hemiparesis and aphasia.  The examination had to be discontinued prior to completion due to patient refusal.   EXAM: MRI HEAD WITHOUT CONTRAST  MRA HEAD WITHOUT CONTRAST  TECHNIQUE: Multiplanar, multiecho pulse sequences of the brain and surrounding structures were obtained without intravenous contrast. Angiographic images of the head were obtained using MRA technique without contrast.  COMPARISON:  CT head without contrast 12/04/2013  FINDINGS: MRI HEAD FINDINGS  The diffusion-weighted images demonstrate no evidence for acute or subacute infarction. No hemorrhage or mass lesion is present. The ventricles are of normal size. No significant extra-axial fluid collection is present.  Flow is present in the major intracranial arteries. The globes and orbits are intact.  A fluid level is present in the left sphenoid sinus. Mucosal  thickening is present in the right sphenoid sinus, bilateral ethmoid air cells, frontal sinuses, and maxillary sinuses. There is a tiny fluid level in the right maxillary sinus. Minimal fluid is present in the mastoid air cells bilaterally. No obstructing nasopharyngeal lesion is evident.  MRA HEAD FINDINGS  The internal carotid arteries are within normal limits from the high cervical segments through the ICA termini bilaterally. The A1 and M1 segments are normal. A tiny anterior communicating artery is present. The MCA bifurcations are within normal limits. The ACA and MCA branch vessels are normal.  The vertebral arteries are codominant. The right PICA origin is visualized and normal. Prominent AICA vessels bilaterally. The basilar artery is normal. Both posterior cerebral arteries originate from the basilar tip. The PCA branch vessels are unremarkable.  IMPRESSION: 1. Normal MRI appearance of the brain. 2. Diffuse sinus disease with fluid levels in the left sphenoid sinus and right maxillary sinus suggesting acute disease. 3. Normal variant MRA circle of Willis without evidence for significant proximal stenosis, aneurysm, or branch vessel occlusion.   Electronically Signed   By: Lawrence Santiago M.D.    On: 12/06/2013 09:32    Medications:  I have reviewed the patient's current medications. Scheduled: .  stroke: mapping our early stages of recovery book   Does not apply Once  . aspirin  300 mg Rectal Daily   Or  . aspirin  325 mg Oral Daily  . enoxaparin (LOVENOX) injection  40 mg Subcutaneous Q24H  . folic acid  1 mg Oral Daily  . LORazepam  1 mg Intravenous Once  . metoCLOPramide (REGLAN) injection  10 mg Intravenous Once  . multivitamin with minerals  1 tablet Oral Daily  . nicotine  21 mg Transdermal Daily  . thiamine  100 mg Oral Daily   Or  . thiamine  100 mg Intravenous Daily    Assessment/Plan: Patient continues to have right sided weakness and difficulty with speech.  MRI of the brain reviewed and unremarkable.  EEG unremarkable as well.  Suspect patient with a complicated migraine.  Echocardiogram unremarkable.  Carotid dopplers show 40-50% right stenosis and minimal left stenosis.  Patient on ASA daily.    Recommendations: 1.  Agree with therapy   LOS: 2 days   Alexis Goodell, MD [facility] [phone] 12/06/2013  10:26 AM

## 2013-12-06 NOTE — Progress Notes (Signed)
Physical Therapy Treatment Patient Details Name: Spencer Mendoza MRN: [id number] DOB: September 14, 1961 Today's Date: 12/06/2013 Time: 8299-3716 PT Time Calculation (min): 27 min  PT Assessment / Plan / Recommendation  History of Present Illness 53 y.o. male admitted to [facility] on 12/04/13 with history of migraine, alcohol abuse, epilepsy that presents to the emergency department with right hemiperesis, aphasia, and headache.  MRI negative for stroke, per pt wife MD is telling them it is a "complex migraine".    PT Comments   Pt is progressing well with his mobility.  Improved strength throughout his right arm and right leg today.  He is able to lift both against gravity at a generally 3-/5 level when tested supine in the bed.  Functionally, however, especially in his right upper extremity is he demonstrating 5/5 strength in both his gripping of the RW and WB enough through the right arm to hop on his left leg only during the start of gait.  Wife, Spencer Mendoza there today.  I reviewed therapy options with her and she is available at home 24/7 to provide assistance.  Changing recs today to HHPT with RW with assist for OOB mobility.  Wife reports that the steps to enter her home are "shaky" and she is interested in ramp rental.  [employer] brochure give to wife as a resource.    Follow Up Recommendations  Home health PT;Supervision for mobility/OOB     Does the patient have the potential to tolerate intense rehabilitation    Yes  Barriers to Discharge   None      Equipment Recommendations  Rolling walker with 5" wheels    Recommendations for Other Services   NA  Frequency Min 3X/week   Progress towards PT Goals Progress towards PT goals: Progressing toward goals  Plan Discharge plan needs to be updated;Frequency needs to be updated    Precautions / Restrictions Precautions Precautions: Fall Precaution Comments: right sided weakness   Pertinent Vitals/Pain See vitals flow sheet.  HA persisting as well as  pt's tendency to keep right eye partially shut during session.  He reports light sensitivity.       Mobility  Bed Mobility Overal bed mobility: Modified Independent Bed Mobility: Supine to Sit Supine to sit: Modified independent (Device/Increase time) General bed mobility comments: using railing for support.  Transfers Overall transfer level: Needs assistance Equipment used: Rolling walker (2 wheeled) Transfers: Sit to/from Stand Sit to Stand: Min assist General transfer comment: Min assist to support trunk to get to standing from bed, up to mod assist to help lower to chair despite use of left arm on amrest of chair, he fell backwards with decreased control for descent.  Ambulation/Gait Ambulation/Gait assistance: Mod assist;+2 physical assistance;Min assist Ambulation Distance (Feet): 45 Feet Assistive device: Rolling walker (2 wheeled) Gait Pattern/deviations: Step-through pattern;Antalgic Gait velocity: decreased General Gait Details: Pt started with hop to gait pattern due to reports of burning pain in right arm and leg he was not WB much through the leg.  He did demonstrate 5/5 strength functionally today with his right hand with the grip on the RW and the ability to unweight his right leg with his arms.  He did eventually put more weight through his right leg the further we walked.  We were limited in our gait distance by RN reporting his HR increased to 127 bpm during gait.  He has a tendancy to hold his breath.   Stairs:  (per wife her steps to get into the home are  very unsteady.) Modified Rankin (Stroke Patients Only) Pre-Morbid Rankin Score:  (MRI negative for stroke.  )      PT Goals (current goals can now be found in the care plan section) Acute Rehab PT Goals Patient Stated Goal: to go home  Visit Information  Last PT Received On: 12/06/13 Assistance Needed: +1 History of Present Illness: 53 y.o. male admitted to [facility] on 12/04/13 with history of migraine, alcohol  abuse, epilepsy that presents to the emergency department with right hemiperesis, aphasia, and headache.  MRI negative for stroke, per pt wife MD is telling them it is a "complex migraine".     Subjective Data  Subjective: Pt is starting to say full phrases.   Patient Stated Goal: to go home   Cognition  Cognition Arousal/Alertness: Awake/alert Behavior During Therapy: Anxious Overall Cognitive Status: Difficult to assess Difficult to assess due to:  (expressive difficulties. Follows commands well, and oriented)    Balance  Balance Overall balance assessment: Needs assistance Sitting-balance support: No upper extremity supported;Feet supported Sitting balance-Leahy Scale: Good Postural control: Other (comment) (pt able to si (long sit) straight up in the bed ) Standing balance support: Bilateral upper extremity supported Standing balance-Leahy Scale: Fair General Comments General comments (skin integrity, edema, etc.): Pt showed at least 3/5 trunk strength today when he sat straight up in the bed from flat supine to long sitting without assist.  Wife interested in renting a WC ramp because of step situation.  Handout given for amramp rental ramp company.    End of Session PT - End of Session Equipment Utilized During Treatment: Gait belt Activity Tolerance: Patient limited by fatigue;Patient limited by pain Patient left: in chair;with call bell/phone within reach;with family/visitor present;Other (comment) (wife Spencer Mendoza present. ) Nurse Communication: Mobility status     Barbarann Ehlers. [place], [place], DPT [phone]   12/06/2013, 12:06 PM

## 2013-12-07 LAB — BASIC METABOLIC PANEL
BUN: 7 mg/dL (ref 6–23)
CO2: 21 mEq/L (ref 19–32)
Calcium: 8.7 mg/dL (ref 8.4–10.5)
Chloride: 99 mEq/L (ref 96–112)
Creatinine, Ser: 0.73 mg/dL (ref 0.50–1.35)
GFR calc Af Amer: >90 mL/min (ref >90)
GFR calc non Af Amer: >90 mL/min (ref >90)
Glucose, Bld: 85 mg/dL (ref 70–99)
Potassium: 3.6 mEq/L — ABNORMAL LOW (ref 3.7–5.3)
Sodium: 136 mEq/L — ABNORMAL LOW (ref 137–147)

## 2013-12-07 MED ORDER — DOCUSATE SODIUM 100 MG PO CAPS: 100.0000 mg | ORAL_CAPSULE | Freq: Two times a day (BID) | ORAL | Status: DC

## 2013-12-07 MED ORDER — FOLIC ACID 1 MG PO TABS: 1.0000 mg | ORAL_TABLET | Freq: Every day | ORAL | Status: DC

## 2013-12-07 MED ORDER — THIAMINE HCL 100 MG PO TABS: 100.0000 mg | ORAL_TABLET | Freq: Every day | ORAL | Status: DC

## 2013-12-07 MED ORDER — NICOTINE 21 MG/24HR TD PT24
21.0000 mg | MEDICATED_PATCH | Freq: Every day | TRANSDERMAL | Status: DC
Start: 2013-12-07 — End: 2013-12-28

## 2013-12-07 MED ORDER — TRAMADOL HCL 50 MG PO TABS: 50.0000 mg | ORAL_TABLET | Freq: Four times a day (QID) | ORAL | Status: DC | PRN

## 2013-12-07 NOTE — Care Management Note (Signed)
    Page 1 of 1   12/07/2013     10:44:00 AM   CARE MANAGEMENT NOTE 12/07/2013  Patient:  Spencer Mendoza, Spencer Mendoza   Account Number:  [id number]  Date Initiated:  12/07/2013  Documentation initiated by:  GRAVES-BIGELOW,Shifa Brisbon  Subjective/Objective Assessment:   Pt admtted for Stroke like symptoms. MRI negative. Pt is from home with family support. Plan for d/c possible today. PT recommends: [facility] and pt is not eligible for services due to has no insurance.     Action/Plan:   CM did call [facility] in reference to possible medicaid vs disability. CM did make f/u appointment and orange card apt at the [facility]. DME RW to be delivered to room before d/c.   Anticipated DC Date:  12/07/2013   Anticipated DC Plan:  HOME/SELF CARE  In-house referral  Financial Counselor      DC Planning Services  CM consult  Follow-up appt scheduled  [facility]      [facility] Choice  DURABLE MEDICAL EQUIPMENT   Choice offered to / List presented to:             Status of service:  Completed, signed off Medicare Important Message given?   (If response is "NO", the following Medicare IM given date fields will be blank) Date Medicare IM given:   Date Additional Medicare IM given:    Discharge Disposition:  HOME/SELF CARE  Per UR Regulation:  Reviewed for med. necessity/level of care/duration of stay  If discussed at [place] of Stay Meetings, dates discussed:    Comments:

## 2013-12-07 NOTE — Progress Notes (Signed)
NEURO HOSPITALIST PROGRESS NOTE   SUBJECTIVE:                                                                                                                        Patient continues to have slight HA but feels it is improving. Right sided weakness also improving.   OBJECTIVE:                                                                                                                           Vital signs in last 24 hours: Temp:  [98 F (36.7 C)-98.3 F (36.8 C)] 98 F (36.7 C) (01/21 0800) Pulse Rate:  [56-72] 67 (01/21 0800) Resp:  [16-18] 16 (01/21 0800) BP: (122-140)/(84-90) 138/84 mmHg (01/21 0800) SpO2:  [94 %-99 %] 94 % (01/21 0800)  Intake/Output from previous day:   Intake/Output this shift:   Nutritional status: General  Past Medical History  Diagnosis Date  . Epilepsy   . Migraine     Neurologic Exam:   Mental Status: Alert, oriented, thought content appropriate.  Speech fluent without evidence of aphasia.  Able to follow 3 step commands without difficulty. Cranial Nerves: II:  Visual fields grossly normal, pupils equal, round, reactive to light and accommodation III,IV, VI: ptosis present on the right, extra-ocular motions intact bilaterally V,VII: smile asymmetric on the right  When smiles but symmetric when not smiling, facial light touch sensation improved but still slightly decreased VIII: hearing normal bilaterally IX,X: gag reflex present XI: bilateral shoulder shrug XII: midline tongue extension   Motor: Right : Upper extremity   4/5    Left:     Upper extremity   4/5 (improved)  Lower extremity   4/5     Lower extremity   5/5 Tone and bulk:normal tone throughout; no atrophy noted Sensory: Pinprick and light touch intact throughout, bilaterally Deep Tendon Reflexes:  Right: Upper Extremity   Left: Upper extremity   biceps (C-5 to C-6) 2/4   biceps (C-5 to C-6) 2/4 tricep (C7) 2/4    triceps (C7)  2/4 Brachioradialis (C6) 2/4  Brachioradialis (C6) 2/4  Lower Extremity Lower Extremity  quadriceps (L-2 to L-4) 2/4   quadriceps (L-2 to L-4) 2/4 Achilles (S1) 2/4  Achilles (S1) 2/4  Plantars: Right: downgoing   Left: downgoing   Lab Results: Basic Metabolic Panel:  Recent Labs Lab 12/04/13 1035 12/05/13 0450 12/06/13 1035 12/07/13 0602  NA 139 140 137 136*  K 3.8 3.9 3.6* 3.6*  CL 98 101 97 99  CO2 20 16* 21 21  GLUCOSE 82 61* 81 85  BUN 6 12 7 7   CREATININE 0.81 0.93 0.72 0.73  CALCIUM 9.5 8.9 8.8 8.7    Liver Function Tests:  Recent Labs Lab 12/04/13 1035  AST 25  ALT 19  ALKPHOS 82  BILITOT 0.8  PROT 7.6  ALBUMIN 4.4   No results found for this basename: LIPASE, AMYLASE,  in the last 168 hours No results found for this basename: AMMONIA,  in the last 168 hours  CBC:  Recent Labs Lab 12/04/13 1035 12/05/13 0450  WBC 14.1* 6.9  NEUTROABS 10.0*  --   HGB 17.6* 16.3  HCT 48.0 45.9  MCV 94.5 96.4  PLT 281 248    Cardiac Enzymes:  Recent Labs Lab 12/04/13 1035  TROPONINI <0.30    Lipid Panel:  Recent Labs Lab 12/05/13 0450  CHOL 174  TRIG 283*  HDL 55  CHOLHDL 3.2  VLDL 57*  LDLCALC 62    CBG:  Recent Labs Lab 12/04/13 0949 12/05/13 2018  GLUCAP 96 96    Microbiology: Results for orders placed during the hospital encounter of 07/28/13  MRSA PCR SCREENING     Status: None   Collection Time    07/29/13  8:44 PM      Result Value Range Status   MRSA by PCR NEGATIVE  NEGATIVE Final   Comment:            The GeneXpert MRSA Assay (FDA     approved for NASAL specimens     only), is one component of a     comprehensive MRSA colonization     surveillance program. It is not     intended to diagnose MRSA     infection nor to guide or     monitor treatment for     MRSA infections.    Coagulation Studies: No results found for this basename: LABPROT, INR,  in the last 72 hours  Imaging: Mr Brain Wo  Contrast  12/06/2013   CLINICAL DATA:  Stroke like symptoms. Right hemiparesis and aphasia.  The examination had to be discontinued prior to completion due to patient refusal.  EXAM: MRI HEAD WITHOUT CONTRAST  MRA HEAD WITHOUT CONTRAST  TECHNIQUE: Multiplanar, multiecho pulse sequences of the brain and surrounding structures were obtained without intravenous contrast. Angiographic images of the head were obtained using MRA technique without contrast.  COMPARISON:  CT head without contrast 12/04/2013  FINDINGS: MRI HEAD FINDINGS  The diffusion-weighted images demonstrate no evidence for acute or subacute infarction. No hemorrhage or mass lesion is present. The ventricles are of normal size. No significant extra-axial fluid collection is present.  Flow is present in the major intracranial arteries. The globes and orbits are intact.  A fluid level is present in the left sphenoid sinus. Mucosal thickening is present in the right sphenoid sinus, bilateral ethmoid air cells, frontal sinuses, and maxillary sinuses. There is a tiny fluid level in the right maxillary sinus. Minimal fluid is present in the mastoid air cells bilaterally. No obstructing nasopharyngeal lesion is evident.  MRA HEAD FINDINGS  The internal carotid arteries are within normal limits from the high cervical segments through the ICA termini  bilaterally. The A1 and M1 segments are normal. A tiny anterior communicating artery is present. The MCA bifurcations are within normal limits. The ACA and MCA branch vessels are normal.  The vertebral arteries are codominant. The right PICA origin is visualized and normal. Prominent AICA vessels bilaterally. The basilar artery is normal. Both posterior cerebral arteries originate from the basilar tip. The PCA branch vessels are unremarkable.  IMPRESSION: 1. Normal MRI appearance of the brain. 2. Diffuse sinus disease with fluid levels in the left sphenoid sinus and right maxillary sinus suggesting acute disease. 3.  Normal variant MRA circle of Willis without evidence for significant proximal stenosis, aneurysm, or branch vessel occlusion.   Electronically Signed   By: Lawrence Santiago M.D.   On: 12/06/2013 09:32   Mr Jodene Nam Head/brain Wo Cm  12/06/2013   CLINICAL DATA:  Stroke like symptoms. Right hemiparesis and aphasia.  The examination had to be discontinued prior to completion due to patient refusal.  EXAM: MRI HEAD WITHOUT CONTRAST  MRA HEAD WITHOUT CONTRAST  TECHNIQUE: Multiplanar, multiecho pulse sequences of the brain and surrounding structures were obtained without intravenous contrast. Angiographic images of the head were obtained using MRA technique without contrast.  COMPARISON:  CT head without contrast 12/04/2013  FINDINGS: MRI HEAD FINDINGS  The diffusion-weighted images demonstrate no evidence for acute or subacute infarction. No hemorrhage or mass lesion is present. The ventricles are of normal size. No significant extra-axial fluid collection is present.  Flow is present in the major intracranial arteries. The globes and orbits are intact.  A fluid level is present in the left sphenoid sinus. Mucosal thickening is present in the right sphenoid sinus, bilateral ethmoid air cells, frontal sinuses, and maxillary sinuses. There is a tiny fluid level in the right maxillary sinus. Minimal fluid is present in the mastoid air cells bilaterally. No obstructing nasopharyngeal lesion is evident.  MRA HEAD FINDINGS  The internal carotid arteries are within normal limits from the high cervical segments through the ICA termini bilaterally. The A1 and M1 segments are normal. A tiny anterior communicating artery is present. The MCA bifurcations are within normal limits. The ACA and MCA branch vessels are normal.  The vertebral arteries are codominant. The right PICA origin is visualized and normal. Prominent AICA vessels bilaterally. The basilar artery is normal. Both posterior cerebral arteries originate from the basilar tip.  The PCA branch vessels are unremarkable.  IMPRESSION: 1. Normal MRI appearance of the brain. 2. Diffuse sinus disease with fluid levels in the left sphenoid sinus and right maxillary sinus suggesting acute disease. 3. Normal variant MRA circle of Willis without evidence for significant proximal stenosis, aneurysm, or branch vessel occlusion.   Electronically Signed   By: Lawrence Santiago M.D.   On: 12/06/2013 09:32       MEDICATIONS  Scheduled: . aspirin  300 mg Rectal Daily   Or  . aspirin  325 mg Oral Daily  . enoxaparin (LOVENOX) injection  40 mg Subcutaneous Q24H  . folic acid  1 mg Oral Daily  . metoCLOPramide (REGLAN) injection  10 mg Intravenous Once  . multivitamin with minerals  1 tablet Oral Daily  . nicotine  21 mg Transdermal Daily  . thiamine  100 mg Oral Daily   Or  . thiamine  100 mg Intravenous Daily    ASSESSMENT/PLAN:                                                                                                             Patient continues to have right sided weakness and difficulty with speech. MRI of the brain reviewed and unremarkable. EEG unremarkable as well. Suspect patient with a complicated migraine. Echocardiogram unremarkable. Carotid dopplers show 40-50% right stenosis and minimal left stenosis. Patient on ASA daily. IT has been discussed with patient and wife this likely represents a complicated migraine.  He will need therapy and out patient follow up with neurology.    Recommendations:  1. Agree with therapy 2. Out patient follow up with neurology--[facility] [address] [place], [place] [address]--[phone] or  [facility] neurology [address], [place], [place] [address] [phone]  Neurology will S/O  Assessment and plan discussed with with attending physician and they are in agreement.    Etta Quill PA-C [facility] [phone]  12/07/2013, 12:34 PM

## 2013-12-07 NOTE — Care Management Note (Signed)
1153 12-07-13 CM did call [facility], [facility] and [facility] to see if they can provide PT services for pt. [facility] & [facility] could not provide services at this time. [facility] has to speak to supervisor to see if pt is eligible. Pt is aware. [facility] to call back with answer. Bethena Roys, RN,BSN [phone]

## 2013-12-07 NOTE — Progress Notes (Signed)
Wife is concerned that husband is unable to navigate the three or four steps they have leading into their home and doesn't want him to discharge until he is able to bear weight on his leg and walk without the aide of a walker.  Wife has been reading on the internet on WebMD and has diagnosed her husband with hemiplegic migraines.  She feels it is too early to let him go home.  Will attempt to reinforce education again and why he doesn't need to remain in hospital any longer.

## 2013-12-07 NOTE — Progress Notes (Signed)
Rehab admissions - Evaluated for possible admission.  Please see rehab consult done by Dr. Naaman Plummer recommending [facility] therapies.  Not a candidate for acute inpatient rehab services.  Call me for questions.  #[phone]

## 2013-12-07 NOTE — Discharge Summary (Addendum)
Physician Discharge Summary  PHILO GLESSNER [id number] DOB: 03/09/1961 DOA: 12/04/2013  PCP: No PCP Per Patient  Admit date: 12/04/2013 Discharge date: 12/07/2013  Time spent: 35 minutes  Recommendations for Outpatient Follow-up:  1. Needs follow up with PT.  2. Need establish care with PCP.   Discharge Diagnoses:    Complex atypical migraine versus some functional features- right-sided weakness   Alcohol withdrawal   Leukocytosis   Acute Gastroenteritis   Hypertension  Discharge Condition: Stable.   Diet recommendation: Regular diet.   Filed Weights   12/04/13 1355  Weight: 79.652 kg (175 lb 9.6 oz)    History of present illness:  Spencer Mendoza is a 53 y.o. male  With a history of migraine, alcohol abuse, epilepsy that presents to the emergency department with stroke like symptoms. Information details history and physical were given by the wife. Patient was last seen "normal" around 2200 01/03/2014. It seems the patient and his wife were out yesterday afternoon at which point upon arrival back home, patient stated he was not feeling well approximately 6 PM yesterday evening. He then proceeded to vomit a couple of times and had diarrhea as well. Proceeded to go to bed around 10 PM. The patient's wife woke up this morning, patient on the couch, with facial droop. Around 9 AM, patient was able to walk into the bedroom at which point he did tell his wife he was not feeling well and wanted to go to the hospital. Upon arrival to the hospital, patient was found to have right-sided hemiparesis as well as aphasia. According to his wife speech has improved slightly. CT scan of the head was conducted showing nothing acute. Patient does have a history of alcohol abuse with withdrawal seizures. His last known drink was yesterday. Per patient's wife, patient also had a history of drug abuse the past.   Hospital Course:  Right-sided hemophoresis with aphasia/dysarthria: complex atypical  migraine versus some functional features- right-sided weakness improving  -Neurology think patient has complex migraine and some functional feature.  - CT scan did not show any acute intracranial abnormalities  - MRI/MRA brain showed no acute CVA, normal gradient MRA circle of Willis  - Urine tox screen positive for THC  - EEG was normal, rule out acute seizures  - PT evaluation recommending Home health PT.  - Carotid Doppler showed a 40-59% ICA stenosis on right, left 1-39%  - 2-D echo showed EF of 60-65%, normal wall motion   -Patient does not qualify for inpatient rehab. Patient medically stable to be discharge. He need PT. This will be arrange by CM.  Acute gastroenteritis  - Continue IV fluids, antiemetics. Resolved.  Leukocytosis likely secondary to gastroenteritis versus acute phase reactant  - Resolved, continue IV fluids  History of alcoholism  -EtOH level 110, monitor for alcohol withdrawal. No DT.  Hypertension  Stable  History of migraine headaches  - Currently stable  DVT Prophylaxis: Lovenox   Procedures: - Carotid Doppler showed a 40-59% ICA stenosis on right, left 1-39%  - 2-D echo showed EF of 60-65%, normal wall motion   Consultations:  Neurology  Discharge Exam: Filed Vitals:   12/07/13 0800  BP: 138/84  Pulse: 67  Temp: 98 F (36.7 C)  Resp: 16    General: No distress.  Cardiovascular: S 1, S 2 RRR Respiratory: CTA  Discharge Instructions  Discharge Orders   Future Appointments Provider Department Dept Phone   12/28/2013 9:30 AM [facility] Provider [facility] [phone]   12/28/2013 12:00 PM [facility] [place] [phone]   Future Orders Complete By Expires   Diet - low sodium heart healthy  As directed    Increase activity slowly  As directed        Medication List         cetirizine 10 MG tablet  Commonly known as:  ZYRTEC  Take 10 mg by mouth  daily.     docusate sodium 100 MG capsule  Commonly known as:  COLACE  Take 1 capsule (100 mg total) by mouth 2 (two) times daily.     folic acid 1 MG tablet  Commonly known as:  FOLVITE  Take 1 tablet (1 mg total) by mouth daily.     GOODY HEADACHE PO  Take 1 packet by mouth daily as needed. For pain     multivitamin with minerals tablet  Take 1 tablet by mouth daily.     nicotine 21 mg/24hr patch  Commonly known as:  NICODERM CQ - dosed in mg/24 hours  Place 1 patch (21 mg total) onto the skin daily.     thiamine 100 MG tablet  Take 1 tablet (100 mg total) by mouth daily.     traMADol 50 MG tablet  Commonly known as:  ULTRAM  Take 1 tablet (50 mg total) by mouth every 6 (six) hours as needed for moderate pain or severe pain.       No Known Allergies     Follow-up Information   Follow up with [facility]     On 12/28/2013. (@ 9:30 am- please bring d/c summary and orange card appointment for medication assistance 12:00 noon same day. )    Contact information:   [place] [phone] [phone]       The results of significant diagnostics from this hospitalization (including imaging, microbiology, ancillary and laboratory) are listed below for reference.    Significant Diagnostic Studies: Ct Head Wo Contrast  12/04/2013   CLINICAL DATA:  Left facial droop, right-sided weakness, slurred speech, left-sided headache, altered mental status  EXAM: CT HEAD WITHOUT CONTRAST  TECHNIQUE: Contiguous axial images were obtained from the base of the skull through the vertex without intravenous contrast.  COMPARISON:  11/03/2010  FINDINGS: Mild mucosal periosteal thickening of the maxillary sinuses. Multifocal complete ethmoid air cell opacification involving scattered bilateral anterior and frontal ethmoid air cells. Frontal sinuses clear. Small air-fluid level in the sphenoid sinus. Calvarium is intact.  Attenuation is normal with  no evidence of hemorrhage, infarct, extra-axial fluid, or mass. No hydrocephalus. Mild stable age-related atrophy.  IMPRESSION: No acute intracranial abnormalities. Similar sinusitis when compared to the prior study, but with the interval development of mild acute sphenoid sinusitis.   Electronically Signed   By: Skipper Cliche M.D.   On: 12/04/2013 10:24   Mr Brain Wo Contrast  12/06/2013   CLINICAL DATA:  Stroke like symptoms. Right hemiparesis and aphasia.  The examination had to be discontinued prior to completion due to patient refusal.  EXAM: MRI HEAD WITHOUT CONTRAST  MRA HEAD WITHOUT CONTRAST  TECHNIQUE: Multiplanar, multiecho pulse sequences of the brain and surrounding structures were obtained without intravenous contrast. Angiographic images of the head were obtained using MRA technique without contrast.  COMPARISON:  CT head without contrast 12/04/2013  FINDINGS: MRI HEAD FINDINGS  The diffusion-weighted images demonstrate no evidence for acute or subacute infarction. No hemorrhage  or mass lesion is present. The ventricles are of normal size. No significant extra-axial fluid collection is present.  Flow is present in the major intracranial arteries. The globes and orbits are intact.  A fluid level is present in the left sphenoid sinus. Mucosal thickening is present in the right sphenoid sinus, bilateral ethmoid air cells, frontal sinuses, and maxillary sinuses. There is a tiny fluid level in the right maxillary sinus. Minimal fluid is present in the mastoid air cells bilaterally. No obstructing nasopharyngeal lesion is evident.  MRA HEAD FINDINGS  The internal carotid arteries are within normal limits from the high cervical segments through the ICA termini bilaterally. The A1 and M1 segments are normal. A tiny anterior communicating artery is present. The MCA bifurcations are within normal limits. The ACA and MCA branch vessels are normal.  The vertebral arteries are codominant. The right PICA origin  is visualized and normal. Prominent AICA vessels bilaterally. The basilar artery is normal. Both posterior cerebral arteries originate from the basilar tip. The PCA branch vessels are unremarkable.  IMPRESSION: 1. Normal MRI appearance of the brain. 2. Diffuse sinus disease with fluid levels in the left sphenoid sinus and right maxillary sinus suggesting acute disease. 3. Normal variant MRA circle of Willis without evidence for significant proximal stenosis, aneurysm, or branch vessel occlusion.   Electronically Signed   By: Lawrence Santiago M.D.   On: 12/06/2013 09:32   Mr Jodene Nam Head/brain Wo Cm  12/06/2013   CLINICAL DATA:  Stroke like symptoms. Right hemiparesis and aphasia.  The examination had to be discontinued prior to completion due to patient refusal.  EXAM: MRI HEAD WITHOUT CONTRAST  MRA HEAD WITHOUT CONTRAST  TECHNIQUE: Multiplanar, multiecho pulse sequences of the brain and surrounding structures were obtained without intravenous contrast. Angiographic images of the head were obtained using MRA technique without contrast.  COMPARISON:  CT head without contrast 12/04/2013  FINDINGS: MRI HEAD FINDINGS  The diffusion-weighted images demonstrate no evidence for acute or subacute infarction. No hemorrhage or mass lesion is present. The ventricles are of normal size. No significant extra-axial fluid collection is present.  Flow is present in the major intracranial arteries. The globes and orbits are intact.  A fluid level is present in the left sphenoid sinus. Mucosal thickening is present in the right sphenoid sinus, bilateral ethmoid air cells, frontal sinuses, and maxillary sinuses. There is a tiny fluid level in the right maxillary sinus. Minimal fluid is present in the mastoid air cells bilaterally. No obstructing nasopharyngeal lesion is evident.  MRA HEAD FINDINGS  The internal carotid arteries are within normal limits from the high cervical segments through the ICA termini bilaterally. The A1 and M1  segments are normal. A tiny anterior communicating artery is present. The MCA bifurcations are within normal limits. The ACA and MCA branch vessels are normal.  The vertebral arteries are codominant. The right PICA origin is visualized and normal. Prominent AICA vessels bilaterally. The basilar artery is normal. Both posterior cerebral arteries originate from the basilar tip. The PCA branch vessels are unremarkable.  IMPRESSION: 1. Normal MRI appearance of the brain. 2. Diffuse sinus disease with fluid levels in the left sphenoid sinus and right maxillary sinus suggesting acute disease. 3. Normal variant MRA circle of Willis without evidence for significant proximal stenosis, aneurysm, or branch vessel occlusion.   Electronically Signed   By: Lawrence Santiago M.D.   On: 12/06/2013 09:32    Microbiology: No results found for this or any previous visit (from  the past 240 hour(s)).   Labs: Basic Metabolic Panel:  Recent Labs Lab 12/04/13 1035 12/05/13 0450 12/06/13 1035 12/07/13 0602  NA 139 140 137 136*  K 3.8 3.9 3.6* 3.6*  CL 98 101 97 99  CO2 20 16* 21 21  GLUCOSE 82 61* 81 85  BUN 6 12 7 7   CREATININE 0.81 0.93 0.72 0.73  CALCIUM 9.5 8.9 8.8 8.7   Liver Function Tests:  Recent Labs Lab 12/04/13 1035  AST 25  ALT 19  ALKPHOS 82  BILITOT 0.8  PROT 7.6  ALBUMIN 4.4   No results found for this basename: LIPASE, AMYLASE,  in the last 168 hours No results found for this basename: AMMONIA,  in the last 168 hours CBC:  Recent Labs Lab 12/04/13 1035 12/05/13 0450  WBC 14.1* 6.9  NEUTROABS 10.0*  --   HGB 17.6* 16.3  HCT 48.0 45.9  MCV 94.5 96.4  PLT 281 248   Cardiac Enzymes:  Recent Labs Lab 12/04/13 1035  TROPONINI <0.30   BNP: BNP (last 3 results) No results found for this basename: PROBNP,  in the last 8760 hours CBG:  Recent Labs Lab 12/04/13 0949 12/05/13 2018  GLUCAP 96 96       Signed:  Gregory Barrick  [facility] 12/07/2013, 11:20  AM

## 2013-12-28 ENCOUNTER — Ambulatory Visit: Payer: Medicaid Other | Attending: Internal Medicine

## 2013-12-28 ENCOUNTER — Encounter: Payer: Self-pay | Admitting: Internal Medicine

## 2013-12-28 ENCOUNTER — Ambulatory Visit: Payer: Medicaid Other | Attending: Internal Medicine | Admitting: Internal Medicine

## 2013-12-28 VITALS — BP 142/92 | HR 82 | Temp 97.9°F | Resp 14 | Ht 72.0 in | Wt 180.0 lb

## 2013-12-28 DIAGNOSIS — M545 Low back pain, unspecified: Secondary | ICD-10-CM | POA: Insufficient documentation

## 2013-12-28 DIAGNOSIS — F1010 Alcohol abuse, uncomplicated: Secondary | ICD-10-CM

## 2013-12-28 DIAGNOSIS — Z8669 Personal history of other diseases of the nervous system and sense organs: Secondary | ICD-10-CM

## 2013-12-28 DIAGNOSIS — G43409 Hemiplegic migraine, not intractable, without status migrainosus: Secondary | ICD-10-CM

## 2013-12-28 DIAGNOSIS — I10 Essential (primary) hypertension: Secondary | ICD-10-CM

## 2013-12-28 DIAGNOSIS — Z139 Encounter for screening, unspecified: Secondary | ICD-10-CM

## 2013-12-28 DIAGNOSIS — G8190 Hemiplegia, unspecified affecting unspecified side: Secondary | ICD-10-CM

## 2013-12-28 DIAGNOSIS — G8929 Other chronic pain: Secondary | ICD-10-CM

## 2013-12-28 DIAGNOSIS — F17200 Nicotine dependence, unspecified, uncomplicated: Secondary | ICD-10-CM | POA: Insufficient documentation

## 2013-12-28 DIAGNOSIS — IMO0001 Reserved for inherently not codable concepts without codable children: Secondary | ICD-10-CM

## 2013-12-28 DIAGNOSIS — Z87898 Personal history of other specified conditions: Secondary | ICD-10-CM

## 2013-12-28 HISTORY — DX: Low back pain, unspecified: M54.50

## 2013-12-28 LAB — CBC WITH DIFFERENTIAL/PLATELET
Basophils Absolute: 0.1 10*3/uL (ref 0.0–0.1)
Basophils Relative: 1 % (ref 0–1)
Eosinophils Absolute: 0.3 10*3/uL (ref 0.0–0.7)
Eosinophils Relative: 3 % (ref 0–5)
HCT: 45.2 % (ref 39.0–52.0)
Hemoglobin: 16 g/dL (ref 13.0–17.0)
Lymphocytes Relative: 38 % (ref 12–46)
Lymphs Abs: 2.8 10*3/uL (ref 0.7–4.0)
MCH: 34 pg (ref 26.0–34.0)
MCHC: 35.4 g/dL (ref 30.0–36.0)
MCV: 96 fL (ref 78.0–100.0)
Monocytes Absolute: 0.7 10*3/uL (ref 0.1–1.0)
Monocytes Relative: 9 % (ref 3–12)
Neutro Abs: 3.7 10*3/uL (ref 1.7–7.7)
Neutrophils Relative %: 49 % (ref 43–77)
Platelets: 300 10*3/uL (ref 150–400)
RBC: 4.71 MIL/uL (ref 4.22–5.81)
RDW: 13.5 % (ref 11.5–15.5)
WBC: 7.4 10*3/uL (ref 4.0–10.5)

## 2013-12-28 MED ORDER — NICOTINE 21 MG/24HR TD PT24: 21.0000 mg | MEDICATED_PATCH | Freq: Every day | TRANSDERMAL | Status: DC

## 2013-12-28 NOTE — Progress Notes (Signed)
Patient Demographics  Spencer Mendoza, is a 53 y.o. male  BVQ:[id number]  CMK:[id number]  DOB - 14-May-1961  CC:  Chief Complaint  Patient presents with  . Follow-up    Hospital f/u       HPI: Spencer Mendoza is a 53 y.o. male here today to establish medical care. he has history of seizure disorder as per patient he was taking Dilantin in the past and for the last 3 years he has not taken any anti-seizure medication, he was recently hospitalized with symptoms of right-sided hemiparesis, EMR reviewed patient had extensive workup done including CT MRI MRA EEG carotid Doppler echocardiogram which came back negative and patient was diagnosed with possible complex atypical migraine versus some functional features patient still reports weakness more prominent in right lower leg compared to right upper extremity, his speech is much better and improved, patient also has blood pressure problem now he is been trying to modify his diet, history of illicit drug abuse in the past currently drink alcohol, also smokes cigarettes he is going to try nicotine patch. Patient has No headache, No chest pain, No abdominal pain - No Nausea, No new weakness tingling or numbness, No Cough - SOB. Patient also reports chronic low back pain.  Allergies  Allergen Reactions  . Codeine    Past Medical History  Diagnosis Date  . Epilepsy   . Migraine    Current Outpatient Prescriptions on File Prior to Visit  Medication Sig Dispense Refill  . Aspirin-Acetaminophen-Caffeine (GOODY HEADACHE PO) Take 1 packet by mouth daily as needed. For pain      . cetirizine (ZYRTEC) 10 MG tablet Take 10 mg by mouth daily.      . folic acid (FOLVITE) 1 MG tablet Take 1 tablet (1 mg total) by mouth daily.  30 tablet  0  . Multiple Vitamins-Minerals (MULTIVITAMIN WITH MINERALS) tablet Take 1 tablet by mouth daily.      [name] docusate sodium (COLACE) 100 MG capsule Take 1 capsule (100 mg total) by mouth 2 (two) times daily.  10  capsule  0  . thiamine 100 MG tablet Take 1 tablet (100 mg total) by mouth daily.  30 tablet  0  . traMADol (ULTRAM) 50 MG tablet Take 1 tablet (50 mg total) by mouth every 6 (six) hours as needed for moderate pain or severe pain.  30 tablet  0   No current facility-administered medications on file prior to visit.   Family History  Problem Relation Age of Onset  . Heart disease Mother   . Stroke Mother   . Heart disease Father   . Hypertension Father    History   Social History  . Marital Status: Divorced    Spouse Name: N/A    Number of Children: N/A  . Years of Education: N/A   Occupational History  . Operates heavy machinery    Social History Main Topics  . Smoking status: Current Every Day Smoker -- 1.00 packs/day for 35 years  . Smokeless tobacco: Not on file  . Alcohol Use: Yes     Comment: Drinks 4-5 beers daily  . Drug Use: No     Comment: last use many years ago   . Sexual Activity: Not on file   Other Topics Concern  . Not on file   Social History Narrative   Pt lives with significant other.     Review of Systems: Constitutional: Negative for fever, chills, diaphoresis, activity change, appetite change and fatigue.  HENT: Negative for ear pain, nosebleeds, congestion, facial swelling, rhinorrhea, neck pain, neck stiffness and ear discharge.  Eyes: Negative for pain, discharge, redness, itching and visual disturbance. Respiratory: Negative for cough, choking, chest tightness, shortness of breath, wheezing and stridor.  Cardiovascular: Negative for chest pain, palpitations and leg swelling. Gastrointestinal: Negative for abdominal distention. Genitourinary: Negative for dysuria, urgency, frequency, hematuria, flank pain, decreased urine volume, difficulty urinating. Musculoskeletal:  +back pain, joint swelling, +arthralgia and gait problem. Neurological: Negative for dizziness, tremors, seizures, syncope, facial asymmetry, speech difficulty, weakness,  light-headedness, numbness and headaches.  Hematological: Negative for adenopathy. Does not bruise/bleed easily. Psychiatric/Behavioral: Negative for hallucinations, behavioral problems, confusion, dysphoric mood, decreased concentration and agitation.    Objective:   Filed Vitals:   12/28/13 0951  BP: 142/92  Pulse: 82  Temp: 97.9 F (36.6 C)  Resp: 14    Physical Exam: Constitutional: Patient appears well-developed and well-nourished. No distress. HENT: Normocephalic, atraumatic, External right and left ear normal. Oropharynx is clear and moist.  Eyes: Conjunctivae and EOM are normal. PERRLA, no scleral icterus. Neck: Normal ROM. Neck supple. No JVD. No tracheal deviation. No thyromegaly. CVS: RRR, S1/S2 +, no murmurs, no gallops, no carotid bruit.  Pulmonary: Effort and breath sounds normal, no stridor, rhonchi, wheezes, rales.  Abdominal: Soft. BS +, no distension, tenderness, rebound or guarding.  Musculoskeletal: Normal range of motion. No edema and no tenderness. Lymphadenopathy: No lymphadenopathy noted, cervical, inguinal or axillary Neuro: Alert.  RUE 4/5  RLE 3/5 strength . No cranial nerve deficit. Skin: Skin is warm and dry. No rash noted. Not diaphoretic. No erythema. No pallor. Psychiatric: Normal mood and affect. Behavior, judgment, thought content normal.  Lab Results  Component Value Date   WBC 6.9 12/05/2013   HGB 16.3 12/05/2013   HCT 45.9 12/05/2013   MCV 96.4 12/05/2013   PLT 248 12/05/2013   Lab Results  Component Value Date   CREATININE 0.73 12/07/2013   BUN 7 12/07/2013   NA 136* 12/07/2013   K 3.6* 12/07/2013   CL 99 12/07/2013   CO2 21 12/07/2013    Lab Results  Component Value Date   HGBA1C 5.0 12/05/2013   Lipid Panel     Component Value Date/Time   CHOL 174 12/05/2013 0450   TRIG 283* 12/05/2013 0450   HDL 55 12/05/2013 0450   CHOLHDL 3.2 12/05/2013 0450   VLDL 57* 12/05/2013 0450   LDLCALC 62 12/05/2013 0450       Assessment and plan:    1. Hemiparesis/7. Hemiplegic migraine/history of seizures.  - Ambulatory referral to Neurology  2. Hypertension Currently patient is modifying his diet to low salt. We'll reevaluate on the next visit if persistently elevated blood pressure consider starting on antihypertensives will check blood chemistry.  - COMPLETE METABOLIC PANEL WITH GFR; Future  3. Chronic low back pain Heating pad, Tylenol when necessary  4. Smoking  - nicotine (NICODERM CQ) 21 mg/24hr patch; Place 1 patch (21 mg total) onto the skin daily.  Dispense: 28 patch; Refill: 0  5. Alcohol abuse Advised patient to cut down on drinking.  8. Screening  - CBC with Differential - Vit D  25 hydroxy (rtn osteoporosis monitoring)   Return in about 6 weeks (around 02/08/2014).    The patient was given clear instructions to go to ER or return to medical center if symptoms don't improve, worsen or new problems develop. The patient verbalized understanding. The patient was told to call to get lab results if they  haven't heard anything in the next week.     Lorayne Marek, MD

## 2013-12-28 NOTE — Progress Notes (Signed)
Patient is here to establish care and a hospital f/u.  Patient is walking on crutches. Suffers from anxiety and a high level of stress. Unable to have mobility in his Rt leg, trouble speaking, was unable to talk in the hospital, may have a mental disorder and speech issues. Patient shows some symptoms of being bipolar and complains of pain on the Lt side of his head. Also complains of complex migranes for years, nausea/vomiting, stuttering, blurred vision and fatigue.

## 2013-12-28 NOTE — Patient Instructions (Signed)
2 Gram Low Sodium Diet A 2 gram sodium diet restricts the amount of sodium in the diet to no more than 2 g or 2000 mg daily. Limiting the amount of sodium is often used to help lower blood pressure. It is important if you have heart, liver, or kidney problems. Many foods contain sodium for flavor and sometimes as a preservative. When the amount of sodium in a diet needs to be low, it is important to know what to look for when choosing foods and drinks. The following includes some information and guidelines to help make it easier for you to adapt to a low sodium diet. QUICK TIPS  Do not add salt to food.  Avoid convenience items and fast food.  Choose unsalted snack foods.  Buy lower sodium products, often labeled as "lower sodium" or "no salt added."  Check food labels to learn how much sodium is in 1 serving.  When eating at a restaurant, ask that your food be prepared with less salt or none, if possible. READING FOOD LABELS FOR SODIUM INFORMATION The nutrition facts label is a good place to find how much sodium is in foods. Look for products with no more than 500 to 600 mg of sodium per meal and no more than 150 mg per serving. Remember that 2 g = 2000 mg. The food label may also list foods as:  Sodium-free: Less than 5 mg in a serving.  Very low sodium: 35 mg or less in a serving.  Low-sodium: 140 mg or less in a serving.  Light in sodium: 50% less sodium in a serving. For example, if a food that usually has 300 mg of sodium is changed to become light in sodium, it will have 150 mg of sodium.  Reduced sodium: 25% less sodium in a serving. For example, if a food that usually has 400 mg of sodium is changed to reduced sodium, it will have 300 mg of sodium. CHOOSING FOODS Grains  Avoid: Salted crackers and snack items. Some cereals, including instant hot cereals. Bread stuffing and biscuit mixes. Seasoned rice or pasta mixes.  Choose: Unsalted snack items. Low-sodium cereals, oats,  puffed wheat and rice, shredded wheat. English muffins and bread. Pasta. Meats  Avoid: Salted, canned, smoked, spiced, pickled meats, including fish and poultry. Bacon, ham, sausage, cold cuts, hot dogs, anchovies.  Choose: Low-sodium canned tuna and salmon. Fresh or frozen meat, poultry, and fish. Dairy  Avoid: Processed cheese and spreads. Cottage cheese. Buttermilk and condensed milk. Regular cheese.  Choose: Milk. Low-sodium cottage cheese. Yogurt. Sour cream. Low-sodium cheese. Fruits and Vegetables  Avoid: Regular canned vegetables. Regular canned tomato sauce and paste. Frozen vegetables in sauces. Olives. Pickles. Relishes. Sauerkraut.  Choose: Low-sodium canned vegetables. Low-sodium tomato sauce and paste. Frozen or fresh vegetables. Fresh and frozen fruit. Condiments  Avoid: Canned and packaged gravies. Worcestershire sauce. Tartar sauce. Barbecue sauce. Soy sauce. Steak sauce. Ketchup. Onion, garlic, and table salt. Meat flavorings and tenderizers.  Choose: Fresh and dried herbs and spices. Low-sodium varieties of mustard and ketchup. Lemon juice. Tabasco sauce. Horseradish. SAMPLE 2 GRAM SODIUM MEAL PLAN Breakfast / Sodium (mg)  1 cup low-fat milk / 143 mg  2 slices whole-wheat toast / 270 mg  1 tbs heart-healthy margarine / 153 mg  1 hard-boiled egg / 139 mg  1 small orange / 0 mg Lunch / Sodium (mg)  1 cup raw carrots / 76 mg   cup hummus / 298 mg  1 cup low-fat milk /   143 mg   cup red grapes / 2 mg  1 whole-wheat pita bread / 356 mg Dinner / Sodium (mg)  1 cup whole-wheat pasta / 2 mg  1 cup low-sodium tomato sauce / 73 mg  3 oz lean ground beef / 57 mg  1 small side salad (1 cup raw spinach leaves,  cup cucumber,  cup yellow bell pepper) with 1 tsp olive oil and 1 tsp red wine vinegar / 25 mg Snack / Sodium (mg)  1 container low-fat vanilla yogurt / 107 mg  3 graham cracker squares / 127 mg Nutrient Analysis  Calories: 2033  Protein:  77 g  Carbohydrate: 282 g  Fat: 72 g  Sodium: 1971 mg Document Released: 11/03/2005 Document Revised: 01/26/2012 Document Reviewed: 02/04/2010 [employer] Patient Information 2014 [employer].  

## 2013-12-29 LAB — VITAMIN D 25 HYDROXY (VIT D DEFICIENCY, FRACTURES): Vit D, 25-Hydroxy: 36 ng/mL (ref 30–89)

## 2014-01-09 ENCOUNTER — Ambulatory Visit (INDEPENDENT_AMBULATORY_CARE_PROVIDER_SITE_OTHER): Payer: Medicaid Other | Admitting: Neurology

## 2014-01-09 ENCOUNTER — Encounter: Payer: Self-pay | Admitting: Neurology

## 2014-01-09 VITALS — BP 138/86 | HR 84 | Temp 97.7°F | Ht 72.0 in | Wt 175.0 lb

## 2014-01-09 DIAGNOSIS — R569 Unspecified convulsions: Secondary | ICD-10-CM | POA: Insufficient documentation

## 2014-01-09 DIAGNOSIS — G43809 Other migraine, not intractable, without status migrainosus: Secondary | ICD-10-CM

## 2014-01-09 DIAGNOSIS — G43901 Migraine, unspecified, not intractable, with status migrainosus: Secondary | ICD-10-CM

## 2014-01-09 MED ORDER — METHYLPREDNISOLONE (PAK) 4 MG PO TABS
ORAL_TABLET | ORAL | Status: DC
Start: 2014-01-09 — End: 2014-02-02

## 2014-01-09 MED ORDER — METOCLOPRAMIDE HCL 5 MG PO TABS: 5.0000 mg | ORAL_TABLET | Freq: Four times a day (QID) | ORAL | Status: DC | PRN

## 2014-01-09 MED ORDER — TOPIRAMATE 50 MG PO TABS: ORAL_TABLET | ORAL | Status: DC

## 2014-01-09 MED ORDER — SUMATRIPTAN SUCCINATE 50 MG PO TABS: ORAL_TABLET | ORAL | Status: DC

## 2014-01-09 MED ORDER — TOPIRAMATE 50 MG PO TABS
ORAL_TABLET | ORAL | Status: DC
Start: 2014-01-09 — End: 2014-01-09

## 2014-01-09 MED ORDER — METHYLPREDNISOLONE (PAK) 4 MG PO TABS: ORAL_TABLET | ORAL | Status: DC

## 2014-01-09 NOTE — Progress Notes (Signed)
NEUROLOGY CONSULTATION NOTE  TAYDIN DUNCAN MRN: [id number] DOB: 08/03/1961  Referring provider: Dr. Lorayne Marek Primary care provider: Dr. Lorayne Marek  Reason for consult:  Severe headaches, seizures  Dear Dr Annitta Needs:  Thank you for your kind referral of Spencer Mendoza for consultation of the above symptoms. Although his history is well known to you, please allow me to reiterate it for the purpose of our medical record. The patient was accompanied to the clinic by his wife who also provides collateral information.   HISTORY OF PRESENT ILLNESS: This is a 53 year old right-handed man with a history of seizures, migraines, alcohol abuse, and a diagnosis of bipolar disorder, presenting for evaluation of severe headaches, right-sided weakness, and seizures.  He was admitted to [facility] on 12/03/13 for headache, vomiting, right-sided weakness, and inability to speak.  Stroke workup was unremarkable, I personally reviewed his MRI brain and MRA head without contrast which did not show any acute changes.  Carotid dopplers showed a 40-59% ICA stenosis on right, left 1-39%.  2-D echo showed EF of 60-65%, normal wall motion.  He had a routine EEG reported as normal.  His urine drug screen was positive for THC, alcohol level was elevated.   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  He was discharged with Tramadol which he ran out of.  He continues to have the severe headaches with associated nausea, vomiting, photo/phonophobia.  He has taken Tylenol, Aleve, Goody powder, with minimal effect.  When he was smoking marijuana, this helped alleviate the headaches.  The right-sided weakness has improved significantly on his right arm, however he continues to need crutches for the right leg, due to leg buckling at the knee.  1. Migraines.  He has a history of migraine headaches since childhood.  He recalls several ER visits as a child since  age 45 or 55, with associated nausea/vomiting.  He would receive phenergan and Demerol in the past.  Over the past month, headaches significantly increased in frequency, initially over the right hemisphere, now over the occipital and frontal regions, with a throbbing pain, and associated nausea/vomiting, photo/phonophobia, and dizziness.  He has visual auras with seeing colors and moving dots prior to a headache, sometimes occurring the day before headache onset.  Since his hospital discharge last month, he has only been headache-free for a week, he currently has had this headache for several days.  Triggers include red wine and stress.  They endorse a significant amount of stress due to social issues (unemployed, now living with his father).  Last intake of Goody powder was almost a week ago.  His father had migraines in childhood.  He reports sleep difficulties, with only 4 hours of restless sleep.  He does not snore per wife.  2. Seizures.  He has a history of seizures since childhood.  He recalls having problems at school due to inattention, then started having "petits" at age 15 where he has eye fluttering, losing train of thought, staring and unresponsive for a minute or so. He feels that he knows what he wants to say but cannot get the words out, which aggravates him.  His wife describes him looking at his hands with possible automatisms in both hands, no lip smacking or dystonic posturing.  He typically looks toward the right side during the seizures.  Occasionally his left leg twitches.  He had his first GTC at age 51, preceded by a "petit  one."  He recalls being prescribed Valium, Librium, and Dilantin, then switched to Phenobarbital and Dilantin.  On this combination, he continued to have the smaller seizures, but GTCs decreased to 1-2 a year.  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  His continues to have GTCs every few months, last GTC was in December, prior to this  was in August.  He continues to have the "petits" every few weeks, in September he fell off the porch.  Last smaller seizure was a week ago.  His has injured himself with the seizures, as well as had urinary incontinence.  There is some report that he had alcohol withdrawal seizures in the past.  He used to drink an 18-pack beer/day, now reduced to 1-2 beers/day.    3. Bipolar disorder.  He endorses being diagnosed with bipolar disorder, but states that he does not agree and is concerned about the stigma.  This was apparently diagnosed due to mood swings.  His wife reports that since the fall in September, she has noticed a change in his personality, he becomes very angry, "flips 180 degrees" when he has worsening headaches or seizures.  His wife reports talking in his sleep saying "I hate..."  He and his wife are concerned because he has "never been this mean."  He denies any diplopia, dysarthria, dysphagia, neck pain, bowel dysfunction.  He has back pain and some pain radiating down the left leg.  He has pain in the right thigh.  He has urge incontinence.  Epilepsy Risk Factors:  His mother has epilepsy and is taking Keppra.  He has been knocked out several times with unknown duration of loss of consciousness, one time sustaining a right frontal scar.  Otherwise he had a normal birth and early development.  There is no history of febrile convulsions, CNS infections, or neurosurgical procedures.  Prior AEDs: Phenobarbital, Dilantin, Depakote  Records and images were personally reviewed where available.    Laboratory Data: Lab Results  Component Value Date   WBC 7.4 12/28/2013   HGB 16.0 12/28/2013   HCT 45.2 12/28/2013   MCV 96.0 12/28/2013   PLT 300 12/28/2013     Chemistry      Component Value Date/Time   NA 136* 12/07/2013 0602   K 3.6* 12/07/2013 0602   CL 99 12/07/2013 0602   CO2 21 12/07/2013 0602   BUN 7 12/07/2013 0602   CREATININE 0.73 12/07/2013 0602      Component Value Date/Time    CALCIUM 8.7 12/07/2013 0602   ALKPHOS 82 12/04/2013 1035   AST 25 12/04/2013 1035   ALT 19 12/04/2013 1035   BILITOT 0.8 12/04/2013 1035       PAST MEDICAL HISTORY: Past Medical History  Diagnosis Date  . Epilepsy   . Migraine     PAST SURGICAL HISTORY: Past Surgical History  Procedure Laterality Date  . Knee arthroscopy Right   . Appendectomy      MEDICATIONS: Current Outpatient Prescriptions on File Prior to Visit  Medication Sig Dispense Refill  . cetirizine (ZYRTEC) 10 MG tablet Take 10 mg by mouth daily.      [name] docusate sodium (COLACE) 100 MG capsule Take 1 capsule (100 mg total) by mouth 2 (two) times daily.  10 capsule  0  . folic acid (FOLVITE) 1 MG tablet Take 1 tablet (1 mg total) by mouth daily.  30 tablet  0  . Multiple Vitamins-Minerals (MULTIVITAMIN WITH MINERALS) tablet Take  1 tablet by mouth daily.      [name] thiamine 100 MG tablet Take 1 tablet (100 mg total) by mouth daily.  30 tablet  0  . nicotine (NICODERM CQ) 21 mg/24hr patch Place 1 patch (21 mg total) onto the skin daily.  28 patch  0  . traMADol (ULTRAM) 50 MG tablet Take 1 tablet (50 mg total) by mouth every 6 (six) hours as needed for moderate pain or severe pain.  30 tablet  0   No current facility-administered medications on file prior to visit.    ALLERGIES: Allergies  Allergen Reactions  . Codeine     FAMILY HISTORY: Family History  Problem Relation Age of Onset  . Heart disease Mother   . Stroke Mother   . Heart disease Father   . Hypertension Father     SOCIAL HISTORY: History   Social History  . Marital Status: Divorced    Spouse Name: N/A    Number of Children: N/A  . Years of Education: N/A   Occupational History  . Operates heavy machinery    Social History Main Topics  . Smoking status: Current Every Day Smoker -- 1.00 packs/day for 35 years  . Smokeless tobacco: Not on file  . Alcohol Use: Yes     Comment: Drinks 4-5 beers daily  . Drug Use: No     Comment: last use  many years ago   . Sexual Activity: Not on file   Other Topics Concern  . Not on file   Social History Narrative   Pt lives with significant other.     REVIEW OF SYSTEMS: Constitutional: No fevers, chills, or sweats, no generalized fatigue, change in appetite Eyes: No visual changes, double vision, eye pain Ear, nose and throat: No hearing loss, ear pain, nasal congestion, sore throat Cardiovascular: No chest pain, + palpitations Respiratory:  No shortness of breath at rest or with exertion, wheezes GastrointestinaI: + nausea, vomiting, no diarrhea, abdominal pain, fecal incontinence Genitourinary:  No dysuria, urinary retention or frequency Musculoskeletal:  No neck pain, + back pain Integumentary: No rash, pruritus, skin lesions Neurological: as above Psychiatric: + depression, insomnia, anxiety Endocrine: No fatigue, diaphoresis, + mood swings, no change in appetite, change in weight, increased thirst Hematologic/Lymphatic:  No anemia, purpura, petechiae. Allergic/Immunologic: no itchy/runny eyes, nasal congestion, recent allergic reactions, rashes  PHYSICAL EXAM: Filed Vitals:   01/09/14 0949  BP: 138/86  Pulse: 84  Temp: 97.7 F (36.5 C)   General: Patient very photophobic and uncomfortable, conducted history-taking in darkened room Head:  Normocephalic/atraumatic Neck: supple, no paraspinal tenderness, full range of motion Back: No paraspinal tenderness Heart: regular rate and rhythm Lungs: Clear to auscultation bilaterally. Vascular: No carotid bruits. Skin/Extremities: No rash, no edema Neurological Exam: Mental status: alert and oriented to person, place, and time, no dysarthria or dysphagia, Fund of knowledge is appropriate.  Recent and remote memory are intact.  Attention and concentration are normal.    Able to name objects and repeat phrases. Cranial nerves: CN I: not tested CN II: pupils equal, round and reactive to light, visual fields intact, fundi  unremarkable. CN III, IV, VI:  full range of motion, no nystagmus, no ptosis CN V: facial sensation intact CN VII: upper and lower face symmetric CN VIII: hearing intact CN IX, X: gag intact, uvula midline CN XI: sternocleidomastoid and trapezius muscles intact CN XII: tongue midline Bulk & Tone: normal, no fasciculations. Motor: Right LE: 4/5 right hip flexion, 4/-5 right knee  extension, 4-/5 foot dorsi/plantar flexion with some giveway weakness and reporting right thigh pain with movements. Sensation: decreased pin cold, and vibration up to the ankle on right LE.  Intact to light touch, cold, pin, vibration and joint position sense on both UE, left LE.  Deep Tendon Reflexes: +2 throughout except for bilateral absent ankle jerks, no ankle clonus Plantar responses: downgoing bilaterally Finger to nose testing: no incoordination  Gait: ambulates with crutch, favoring the right leg. Unable to do tandem walk  IMPRESSION: This is a 53 year old right-handed man with a history of migraines and seizures, presenting with status migrainosus.  He will be given a Medrol dosepack, and will start headache prophylaxis with Topiramate uptitration.  Side effects were discussed.  He was given Imitrex to take at the onset of headache.  He may use prn Reglan for migraine-associated nausea and vomiting. Rebound headaches were discussed as well, and he knows to minimize use of rescue medications to 2-3/week.  He continues to have seizures, either complex partial seizures that secondarily generalize versus absence seizures with GTCs.  Topiramate is a broad-spectrum AED that can help with both types.  He would benefit from prolonged EEG for seizure classification in the future if he continues to have seizures on adequate AED doses.  We discussed residual right-sided weakness, MRI brain unremarkable.  There was concern for functional component in the hospital, and there is some giveway weakness noted today.  Physical  therapy and exercise were encouraged today.  Topamax may help with mood stabilization as well.  If ineffective, he may benefit from Depakote in the future, for headache/seizure prophylaxis and mood stabilization.  [place] driving laws were discussed, and he knows not to drive until 6 months seizure-free.  He will follow-up in 3 months.    Thank you for allowing me to participate in the care of this patient. Please do not hesitate to call for any questions or concerns.   Ellouise Newer, M.D.

## 2014-01-09 NOTE — Patient Instructions (Addendum)
1. Start Medrol dosepack 2. Start Topiramate 50mg  tab. Take 1/2 tab twice a day for 1 week, then increase to 1 tab twice a day 3. For headache rescue medication, take Imitrex 50mg  tablet at onset of headache, may take second dose after 1 hour. Do not take more than 3 a week. 4. For nausea, take metoclopramide 5mg  as needed every 6 hours 5. Continue with leg exercises

## 2014-01-13 ENCOUNTER — Other Ambulatory Visit: Payer: Self-pay | Admitting: *Deleted

## 2014-01-13 ENCOUNTER — Telehealth: Payer: Self-pay | Admitting: Neurology

## 2014-01-13 DIAGNOSIS — R51 Headache: Principal | ICD-10-CM

## 2014-01-13 DIAGNOSIS — R519 Headache, unspecified: Secondary | ICD-10-CM

## 2014-01-13 MED ORDER — PREDNISONE 10 MG PO TABS: ORAL_TABLET | ORAL | Status: DC

## 2014-01-13 NOTE — Telephone Encounter (Signed)
Please tell him we will extend duration of steroids, and increase dose.  Please send to his pharmacy a prescription for Prednisone 10mg  tabs.  Take 6 tabs on day 1, 5 tabs on day 2, 4 tabs on day 3, 3 tabs on day 4, 2 tabs on days 5 and 6, and 1 tab on days 7 and 8.  Total of 24 tabs, no refills.  Thank you.

## 2014-01-13 NOTE — Telephone Encounter (Signed)
Patient is still having headaches that are really bad and has taken the medication is not helping  [phone] please call

## 2014-01-13 NOTE — Telephone Encounter (Signed)
Please advise 

## 2014-01-13 NOTE — Telephone Encounter (Signed)
[facility]

## 2014-01-13 NOTE — Telephone Encounter (Signed)
Pt given instructions and agreed with plan.

## 2014-01-13 NOTE — Telephone Encounter (Signed)
Pt's spouse called again stating that the headache pain is getting worse and is having seizure like symptoms. Please call pt.

## 2014-01-13 NOTE — Telephone Encounter (Signed)
Please tell patient if he is having more seizure-like symptoms, he can increase Topamax dose to 1 tablet twice a day starting today.  Would go ahead with the steroid for headache.  If progressively worsens, go to ER. Thanks

## 2014-01-16 ENCOUNTER — Telehealth: Payer: Self-pay | Admitting: Neurology

## 2014-01-16 NOTE — Telephone Encounter (Signed)
[phone], [name] called, pt's wife, patient had some issues arise over the w/e. She would like to update Dr. Delice Lesch on what went on and see if appt is needed. / Sherri

## 2014-01-16 NOTE — Telephone Encounter (Signed)
Called patient no answer.

## 2014-01-17 NOTE — Telephone Encounter (Signed)
Spoke with patients wife she states that Spencer Mendoza still had a server headache(10) after starting the prednisone. He went to the hospital([facility]) ER where he was giving 4mg  Zofran,10mg  Reglan,10mg  Decadron IV. Patients wife states that it stopped the pounding but did not stop the headache all together. After leaving the hospital patient had a seizure once he was home. She states that he still has a headache but it is about a 6 or 7 at this time. Patient like to know if he should come in to be seen keep his follow up which should be around the end of the month. Please advise

## 2014-01-17 NOTE — Telephone Encounter (Signed)
Spoke to wife [name], he had a seizure where he did not respond, rolled on stomach, pointed pointer finger in both hands, snorting noise, sliding down the bed and stiffened x mins. Confused after and did not recall episode.  Instructed to increase Topamax 50mg  to 1 tab in AM, 2 tabs in PM x 3 days, then 2 tabs BID.  Had been told by ER to take Ibuprofen 800mg  and Reglan 10mg  prn, ok to take prn but do not overuse.  Wife expressed understanding of instructions.

## 2014-01-26 ENCOUNTER — Telehealth: Payer: Self-pay | Admitting: Neurology

## 2014-01-26 DIAGNOSIS — R569 Unspecified convulsions: Secondary | ICD-10-CM

## 2014-01-26 DIAGNOSIS — G43901 Migraine, unspecified, not intractable, with status migrainosus: Secondary | ICD-10-CM

## 2014-01-26 NOTE — Telephone Encounter (Signed)
Spoke with patient he states he is not getting any relief from the medication hes taking. He still has very server headaches, nausea and vomiting. He cant eat or sleep. At this point his quality of life is not good at all. He states he is still having seizures and almost feels better when he does have one. He wants to know what he should do.

## 2014-01-26 NOTE — Telephone Encounter (Signed)
Pt's spouse called regarding a refill...please call back!

## 2014-01-27 MED ORDER — METOCLOPRAMIDE HCL 10 MG PO TABS: 10.0000 mg | ORAL_TABLET | ORAL | Status: DC | PRN

## 2014-01-27 MED ORDER — DIVALPROEX SODIUM ER 250 MG PO TB24: 250.0000 mg | ORAL_TABLET | Freq: Every day | ORAL | Status: DC

## 2014-01-27 NOTE — Telephone Encounter (Signed)
Please let him know that we will schedule a 24-hour EEG for him to help [place] better understand and treat the seizures.  For the headaches, would add on Depakote ER 250mg  1 tab at bedtime to his current Topamax 100mg  BID dose.  Does he still have Reglan 10mg  tabs to take prn for nausea, if not, pls send 30 tabs with 3 refills, take q8 prn nausea.  Thanks

## 2014-01-27 NOTE — Telephone Encounter (Signed)
Patient scheduled for March 31 @ 10am. Will notify patient of time and date.

## 2014-01-27 NOTE — Telephone Encounter (Signed)
Medication sent to desired pharmacy. 

## 2014-01-27 NOTE — Telephone Encounter (Signed)
Pt wife called and said that we need to call his medication into [facility] in [place] at [phone] and the patient phone number is [phone]

## 2014-01-31 NOTE — Telephone Encounter (Signed)
noted 

## 2014-01-31 NOTE — Telephone Encounter (Signed)
Patient scheduled for the 19 @ 11:30 patient notified

## 2014-01-31 NOTE — Telephone Encounter (Signed)
Pt's Spouse called to give an Update: Pt went to the ER again on 01/28/14. The ER advised them to do a f/u with Dr. Delice Lesch can you please confirm  It is okay to schedule his f/u.

## 2014-02-02 ENCOUNTER — Encounter: Payer: Self-pay | Admitting: Neurology

## 2014-02-02 ENCOUNTER — Ambulatory Visit (INDEPENDENT_AMBULATORY_CARE_PROVIDER_SITE_OTHER): Payer: Medicaid Other | Admitting: Neurology

## 2014-02-02 VITALS — BP 105/70 | HR 84 | Temp 97.9°F | Ht 72.0 in | Wt 176.0 lb

## 2014-02-02 DIAGNOSIS — G43901 Migraine, unspecified, not intractable, with status migrainosus: Secondary | ICD-10-CM

## 2014-02-02 DIAGNOSIS — R569 Unspecified convulsions: Secondary | ICD-10-CM

## 2014-02-02 DIAGNOSIS — G4700 Insomnia, unspecified: Secondary | ICD-10-CM

## 2014-02-02 DIAGNOSIS — G43809 Other migraine, not intractable, without status migrainosus: Secondary | ICD-10-CM

## 2014-02-02 MED ORDER — ESZOPICLONE 2 MG PO TABS: 2.0000 mg | ORAL_TABLET | Freq: Every evening | ORAL | Status: DC | PRN

## 2014-02-02 MED ORDER — ZOLMITRIPTAN 5 MG NA SOLN: 1.0000 | NASAL | Status: DC | PRN

## 2014-02-02 MED ORDER — METOCLOPRAMIDE HCL 10 MG PO TABS: 10.0000 mg | ORAL_TABLET | ORAL | Status: DC | PRN

## 2014-02-02 MED ORDER — RIBOFLAVIN 400 MG PO CAPS: 1.0000 | ORAL_CAPSULE | Freq: Every day | ORAL | Status: DC

## 2014-02-02 MED ORDER — DIVALPROEX SODIUM ER 250 MG PO TB24: ORAL_TABLET | ORAL | Status: DC

## 2014-02-02 MED ORDER — MAGNESIUM 400 MG PO CAPS: 1.0000 | ORAL_CAPSULE | Freq: Every day | ORAL | Status: DC

## 2014-02-02 MED ORDER — TOPIRAMATE 50 MG PO TABS: ORAL_TABLET | ORAL | Status: DC

## 2014-02-02 NOTE — Progress Notes (Signed)
NEUROLOGY FOLLOW UP OFFICE NOTE  Spencer Mendoza PB:[id number]  HISTORY OF PRESENT ILLNESS: I had the pleasure of seeing Spencer Mendoza in follow-up in the neurology clinic on 02/02/2014 as an urgent follow-up after ER visit 5 days ago for severe headache.  The patient was last seen in the clinic last month and is again accompanied by Spencer Mendoza today.  Records and images were personally reviewed where available.   Since Spencer last visit, Spencer Mendoza had called our office to report a convulsive seizure on 03/03 where he was unresponsive, then rolled on Spencer stomach, pointed pointer finger in both hands, followed by snorting noises, sliding down the bed and stiffened x a few minutes.  He was confused after and did not recall episode.  He had been to the ER twice in the past month for significant headaches.  Medrol dosepack and Prednisone taper were ineffective.  He is now on a higher dose of Topamax 100mg  BID and started on Depakote ER 250mg  qhs.  On Spencer most recent ER visit 5 days ago at [facility] for the headaches, he received a load of IV Depacon and IV magnesium.  Spencer Mendoza reports that the throbbing was relieved more by the magnesium.  He continues to have daily headaches and generalized malaise.  He also reports continued "petit" seizures, last was 2 days ago.    HPI:  This is a 53 yo RH man with a history of seizures, migraines, alcohol abuse, and a diagnosis of bipolar disorder, admitted to [facility] on 12/03/13 for headache, vomiting, right-sided weakness, and inability to speak. Stroke workup was unremarkable, I personally reviewed Spencer MRI brain and MRA head without contrast which did not show any acute changes. Carotid dopplers showed a 40-59% ICA stenosis on right, left 1-39%. 2-D echo showed EF of 60-65%, normal wall motion. He had a routine EEG reported as normal. Spencer urine drug screen was positive for THC, alcohol level was elevated. He continued to have right-sided weakness, felt to be possible  hemiplegic migraine with a functional component. He was discharged with Tramadol which he ran out of. He continues to have the severe headaches with associated nausea, vomiting, photo/phonophobia. He has taken Tylenol, Aleve, Goody powder, with minimal effect. When he was smoking marijuana, this helped alleviate the headaches. The right-sided weakness has improved significantly on Spencer right arm, however he continues to need crutches for the right leg, due to leg buckling at the knee.   1. Migraines. He has a history of migraine headaches since childhood. He recalls several ER visits as a child since age 53 or 79, with associated nausea/vomiting. He would receive phenergan and Demerol in the past. Over the past month, headaches significantly increased in frequency, initially over the right hemisphere, now over the occipital and frontal regions, with a throbbing pain, and associated nausea/vomiting, photo/phonophobia, and dizziness. He has visual auras with seeing colors and moving dots prior to a headache, sometimes occurring the day before headache onset. Triggers include red wine and stress. They endorse a significant amount of stress due to social issues (unemployed, now living with Spencer father who has metastatic cancer).  2. Seizures. He has a history of seizures since childhood. He recalls having problems at school due to inattention, then started having "petits" at age 53 where he has eye fluttering, losing train of thought, staring and unresponsive for a minute or so. He feels that he knows what he wants to say but cannot get the words out,  which aggravates him. Spencer Mendoza describes him looking at Spencer hands with possible automatisms in both hands, no lip smacking or dystonic posturing. He typically looks toward the right side during the seizures. Occasionally Spencer left leg twitches. He had Spencer first GTC at age 53, preceded by a "petit one." He recalls being prescribed Valium, Librium, and Dilantin, then switched  to Phenobarbital and Dilantin. On this combination, he continued to have the smaller seizures, but GTCs decreased to 1-2 a year. 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 Spencer continues to have GTCs every few months, last GTC was in December, prior to this was in August. He continues to have the "petits" every few weeks, in September he fell off the porch. Last smaller seizure was a week ago. Spencer has injured himself with the seizures, as well as had urinary incontinence. There is some report that he had alcohol withdrawal seizures in the past. He used to drink an 18-pack beer/day, now reduced to 1-2 beers/day.   3. Bipolar disorder. He endorses being diagnosed with bipolar disorder, but states that he does not agree and is concerned about the stigma. This was apparently diagnosed due to mood swings. Spencer Mendoza reports that since the fall in September, she has noticed a change in Spencer personality, he becomes very angry, "flips 180 degrees" when he has worsening headaches or seizures. Spencer Mendoza reports talking in Spencer sleep saying "I hate..." He and Spencer Mendoza are concerned because he has "never been this mean."   PAST MEDICAL HISTORY: Past Medical History  Diagnosis Date  . Epilepsy   . Migraine     MEDICATIONS: Current Outpatient Prescriptions on File Prior to Visit  Medication Sig Dispense Refill  . cetirizine (ZYRTEC) 10 MG tablet Take 10 mg by mouth daily.      [name] docusate sodium (COLACE) 100 MG capsule Take 1 capsule (100 mg total) by mouth 2 (two) times daily.  10 capsule  0  . folic acid (FOLVITE) 1 MG tablet Take 1 tablet (1 mg total) by mouth daily.  30 tablet  0  . Multiple Vitamins-Minerals (MULTIVITAMIN WITH MINERALS) tablet Take 1 tablet by mouth daily.      . nicotine (NICODERM CQ) 21 mg/24hr patch Place 1 patch (21 mg total) onto the skin daily.  28 patch  0  . SUMAtriptan (IMITREX) 50 MG tablet Take as needed at onset of headache, may take second dose after 1  hour.  Do not take more than 3 in a week.  10 tablet  4  . thiamine 100 MG tablet Take 1 tablet (100 mg total) by mouth daily.  30 tablet  0  . traMADol (ULTRAM) 50 MG tablet Take 1 tablet (50 mg total) by mouth every 6 (six) hours as needed for moderate pain or severe pain.  30 tablet  0   No current facility-administered medications on file prior to visit.    ALLERGIES: Allergies  Allergen Reactions  . Codeine     FAMILY HISTORY: Family History  Problem Relation Age of Onset  . Heart disease Mother   . Stroke Mother   . Heart disease Father   . Hypertension Father     SOCIAL HISTORY: History   Social History  . Marital Status: Divorced    Spouse Name: N/A    Number of Children: N/A  . Years of Education: N/A   Occupational History  . Operates heavy machinery    Social History  Main Topics  . Smoking status: Current Every Day Smoker -- 1.00 packs/day for 35 years  . Smokeless tobacco: Not on file  . Alcohol Use: Yes     Comment: Drinks 4-5 beers daily  . Drug Use: No     Comment: last use many years ago   . Sexual Activity: Not on file   Other Topics Concern  . Not on file   Social History Narrative   Pt lives with significant other.     REVIEW OF SYSTEMS: Constitutional: No fevers, chills, or sweats, + generalized fatigue, change in appetite Eyes: No visual changes, double vision, eye pain Ear, nose and throat: No hearing loss, ear pain, nasal congestion, sore throat Cardiovascular: No chest pain, palpitations Respiratory:  No shortness of breath at rest or with exertion, wheezes GastrointestinaI: No nausea, vomiting, diarrhea, abdominal pain, fecal incontinence Genitourinary:  No dysuria, urinary retention or frequency Musculoskeletal:  No neck pain, back pain Integumentary: No rash, pruritus, skin lesions Neurological: as above Psychiatric: No depression, +insomnia, anxiety Endocrine: No palpitations, fatigue, diaphoresis, mood swings, change in  appetite, change in weight, increased thirst Hematologic/Lymphatic:  No anemia, purpura, petechiae. Allergic/Immunologic: no itchy/runny eyes, nasal congestion, recent allergic reactions, rashes  PHYSICAL EXAM: There were no vitals filed for this visit. General: No acute distress Head:  Normocephalic/atraumatic Neck: supple, no paraspinal tenderness, full range of motion Heart:  Regular rate and rhythm Lungs:  Clear to auscultation bilaterally Back: No paraspinal tenderness Skin/Extremities: No rash, no edema Neurological Exam: alert and oriented to person, place, and time. No aphasia or dysarthria. Fund of knowledge is appropriate.  Recent and remote memory are intact.  Attention and concentration are normal.    Able to name objects and repeat phrases. Cranial nerves: Pupils equal, round, reactive to light.  Fundoscopic exam unremarkable, no papilledema. Extraocular movements intact with no nystagmus. Visual fields full. Facial sensation intact. No facial asymmetry. Tongue, uvula, palate midline.  Motor: Bulk and tone normal, muscle strength 5/5 throughout with no pronator drift today.  No right-sided weakness noted today, good fine finger movements on both hands.  Sensation to light touch, temperature and vibration intact.  No extinction to double simultaneous stimulation.  Deep tendon reflexes 2+ throughout, toes downgoing.  Finger to nose testing intact.  Gait narrow-based and steady, able to tandem walk adequately.  Romberg negative.  IMPRESSION: This is a 53 yo RH man with a history of intractable migraines and seizures.  He presents for urgent follow-up after recent ER visit for headaches.  He continues to have the "petit" seizures.  He is scheduled for a 24-hour EEG to better classify the seizures.  In the meantime, he will increase dose of Depakote ER slowly to 250mg  in AM, 500mg  in PM.  Continue Topamax 100mg  BID.  We discussed medication overuse headaches, he will reduce intake of  Ibuprofen and goody powder, and use prn Zomig nasal spray as rescue.  He knows to minimize rescue medications to 2-3/week.  He will start daily magnesium and riboflavin supplements to help with the headaches.  He has tried melatonin and Ambien in the past for insomnia, with no effect, side effects of bad dreams on Ambien.  He will try Lunesta for sleep.  We discussed the importance of sleep hygiene with headaches and seizures.  He will follow-up in 2 months.He is aware of [place] driving laws and he knows not to drive until 6 months seizure-free.   Thank you for allowing me to participate in Spencer care.  Please do not hesitate to call for any questions or concerns.  The duration of this appointment visit was 25 minutes of face-to-face time with the patient.  Greater than 50% of this time was spent in counseling, explanation of diagnosis, planning of further management, and coordination of care.   Ellouise Newer, M.D.

## 2014-02-02 NOTE — Patient Instructions (Addendum)
1. Increase Depakote ER 250mg  caps: Take 2 caps at bedtime for 1 week, then increase to 1 cap in AM, 2 caps in PM 2. Continue Topamax 100mg  twice a day 3. Start magnesium 400mg  daily 4. Start riboflavin 400mg  daily 5. Start Lunesta 2mg  at bedtime to help with sleep.  Call our office if helpful 6. Go ahead with 24-hour EEG 7. Use Zomig nasal spray in one nostril at onset of headache. May take second dose after 1 hour. Do not use more than 3 times a week 8. Minimize Ibuprofen and Goody powder as much as you can 9. Follow-up in 2 months

## 2014-02-03 ENCOUNTER — Telehealth: Payer: Self-pay | Admitting: Neurology

## 2014-02-03 NOTE — Telephone Encounter (Signed)
[facility] called. They have received scripts for this patient but did not get a script for Folic Acid. Please send script for this med. CB# [phone] / Venida Jarvis

## 2014-02-03 NOTE — Telephone Encounter (Signed)
Please let her know unfortunately no generic of Lunesta.  For the Zomig, can she try other pharmacies, such as [facility], they may have better price. Thanks

## 2014-02-03 NOTE — Telephone Encounter (Signed)
Pt's spouse called stating that she could not get Lunesta 2mg  due to the cost $50 for only 5 tablets and Zoming 5mg  $450.00 She would like to know if there are any generic to both medications.

## 2014-02-03 NOTE — Telephone Encounter (Signed)
Please advise 

## 2014-02-03 NOTE — Telephone Encounter (Signed)
[facility] called. They have received scripts for this patient but did not get a script for Folic Acid. Please send script for this med. CB# [phone] / Venida Jarvis

## 2014-02-06 ENCOUNTER — Other Ambulatory Visit: Payer: Self-pay | Admitting: *Deleted

## 2014-02-06 MED ORDER — FOLIC ACID 1 MG PO TABS: 1.0000 mg | ORAL_TABLET | Freq: Every day | ORAL | Status: DC

## 2014-02-06 NOTE — Telephone Encounter (Signed)
Spoke with patient about medications and advised him to try other pharmacy per Dr.Aquino. Folic acid has been sent to the pharmacy as well

## 2014-02-06 NOTE — Telephone Encounter (Signed)
Rx sent to pharmacy   

## 2014-02-07 ENCOUNTER — Telehealth: Payer: Self-pay | Admitting: Surgery

## 2014-02-07 NOTE — Telephone Encounter (Signed)
Appt. Reminder call 3/235 at 2:00p, unable to reach patient LVM

## 2014-02-08 ENCOUNTER — Ambulatory Visit: Payer: Self-pay | Admitting: Internal Medicine

## 2014-02-14 ENCOUNTER — Ambulatory Visit (HOSPITAL_COMMUNITY)
Admission: RE | Admit: 2014-02-14 | Discharge: 2014-02-14 | Disposition: A | Discharge status: Home / Self Care | Payer: Medicaid Other | Source: Ambulatory Visit | Attending: Neurology | Admitting: Neurology

## 2014-02-14 DIAGNOSIS — R569 Unspecified convulsions: Secondary | ICD-10-CM

## 2014-02-14 NOTE — Progress Notes (Signed)
Amb EEG started.

## 2014-02-16 ENCOUNTER — Telehealth: Payer: Self-pay | Admitting: Neurology

## 2014-02-16 NOTE — Procedures (Signed)
ELECTROENCEPHALOGRAM REPORT  Dates of Recording: 02/14/2014 to 02/15/2014  Patient's Name: Spencer Mendoza MRN: [id number] Date of Birth: 06-06-1961  Referring Provider: Dr. Ellouise Newer  Procedure: 94-hour ambulatory EEG  History: This is a 53 year old right-handed man with a history of migraines and seizures.  He continues to have seizures despite 2 AEDs, with "petit mals" and convulsions.  EEG for classification.  Medications: Depakote, Topamax  Technical Summary: This is a 24-hour multichannel digital EEG recording measured by the international 10-20 system with electrodes applied with paste and impedances below 5000 ohms performed as portable with EKG monitoring.  The digital EEG was referentially recorded, reformatted, and digitally filtered in a variety of bipolar and referential montages for optimal display.    DESCRIPTION OF RECORDING: During maximal wakefulness, the background activity consisted of a symmetric 10.5 Hz posterior dominant rhythm which was reactive to eye opening.  There were no epileptiform discharges or focal slowing seen in wakefulness.  During the recording, the patient progresses through wakefulness, drowsiness, and Stage 2 sleep.  Again, there were no epileptiform discharges seen.  Events: Typical events were not captured.  Patient reported having a headache all day.  There were no electrographic seizures seen.  EKG lead was unremarkable.  IMPRESSION: This 24-hour ambulatory EEG study is normal.    CLINICAL CORRELATION: A normal EEG does not exclude a clinical diagnosis of epilepsy. Typical events were not captured.  If further clinical questions remain, inpatient video EEG monitoring may be helpful.   Ellouise Newer, M.D.

## 2014-02-16 NOTE — Telephone Encounter (Signed)
Pt's spouse called stating that she might have to take him to the ER due to him having severe migraine. Please call Pt's spouse.

## 2014-02-17 ENCOUNTER — Ambulatory Visit: Payer: Self-pay | Admitting: Internal Medicine

## 2014-02-20 NOTE — Telephone Encounter (Signed)
Patient was advised to go back to [facility] where he had at one time gotten some releaf  Per Dr Delice Lesch

## 2014-02-21 ENCOUNTER — Telehealth: Payer: Self-pay | Admitting: Neurology

## 2014-02-21 NOTE — Telephone Encounter (Signed)
Discussed EEG findings, patient and wife report that he had the "petits" during the EEG but did not write them down.  Discussed normal EEG, no epileptiform discharges seen.  They went to [facility] ER for worsening headaches last week, given IV magnesium, phenergan and Dilaudid x 2.  He had run out of Depakote 5 days ago due to financial constraints.  Instructed to restart at 2 tabs BID.  Discussed that the EEG is sensitive for seizures, it is not a test for migraines.  Discussed that I would not keep pushing up medications for the "petits" since no changes on EEG, but will continue to treat the headaches.  Continue Depakote and Topamax.

## 2014-02-21 NOTE — Telephone Encounter (Signed)
Please advise 

## 2014-02-21 NOTE — Telephone Encounter (Signed)
Pt's wife called for EEG results. Also wants to know if pt needs to re-start Depakote. Please call [phone] / Sherri S.

## 2014-02-27 ENCOUNTER — Telehealth: Payer: Self-pay | Admitting: Neurology

## 2014-02-27 NOTE — Telephone Encounter (Signed)
PT [phone] needs a referral to another dr please call

## 2014-02-27 NOTE — Telephone Encounter (Signed)
Pt wife called and states that he needs a referral to a office please call her  Back at [phone]

## 2014-02-28 NOTE — Telephone Encounter (Signed)
Spoke with patient wife she was requesting Spencer Mendoza medical record for the Headache and [place].

## 2014-03-01 ENCOUNTER — Ambulatory Visit (INDEPENDENT_AMBULATORY_CARE_PROVIDER_SITE_OTHER): Payer: Medicaid Other | Admitting: Neurology

## 2014-03-01 ENCOUNTER — Ambulatory Visit: Payer: Medicaid Other | Attending: Internal Medicine

## 2014-03-01 ENCOUNTER — Encounter: Payer: Self-pay | Admitting: Neurology

## 2014-03-01 ENCOUNTER — Other Ambulatory Visit: Payer: Self-pay

## 2014-03-01 ENCOUNTER — Telehealth: Payer: Self-pay | Admitting: Internal Medicine

## 2014-03-01 VITALS — BP 105/74 | HR 64 | Temp 97.8°F | Ht 72.0 in | Wt 172.2 lb

## 2014-03-01 DIAGNOSIS — R569 Unspecified convulsions: Secondary | ICD-10-CM

## 2014-03-01 DIAGNOSIS — R51 Headache: Secondary | ICD-10-CM

## 2014-03-01 DIAGNOSIS — G43901 Migraine, unspecified, not intractable, with status migrainosus: Secondary | ICD-10-CM

## 2014-03-01 MED ORDER — SUMATRIPTAN SUCCINATE 50 MG PO TABS: ORAL_TABLET | ORAL | Status: DC

## 2014-03-01 MED ORDER — DIVALPROEX SODIUM ER 250 MG PO TB24
ORAL_TABLET | ORAL | Status: DC
Start: 2014-03-01 — End: 2014-03-01

## 2014-03-01 MED ORDER — DIVALPROEX SODIUM ER 250 MG PO TB24: ORAL_TABLET | ORAL | Status: DC

## 2014-03-01 MED ORDER — VENLAFAXINE HCL 37.5 MG PO TABS: 37.5000 mg | ORAL_TABLET | Freq: Two times a day (BID) | ORAL | Status: DC

## 2014-03-01 MED ORDER — PROMETHAZINE HCL 12.5 MG PO TABS: 12.5000 mg | ORAL_TABLET | Freq: Two times a day (BID) | ORAL | Status: DC | PRN

## 2014-03-01 NOTE — Telephone Encounter (Signed)
Pt tried to see his pcp today. Pt came for Chronic Diarrhea. Dr. Annitta Needs next appt available is on 6/15 please pt needs to know if he can come before to see his pcp.  [place]

## 2014-03-01 NOTE — Patient Instructions (Signed)
1. Continue reduction of Topamax by 1 tablet every week then stop 2. Continue with increase schedule of Depakote 3. Start venlafaxine 37.5mg  at bedtime for 1 week, then increase to 1 tab twice a day 4. Continue to keep headache calendar and follow-up with [facility] 5. Follow-up in 3 months

## 2014-03-01 NOTE — Progress Notes (Signed)
NEUROLOGY FOLLOW UP OFFICE NOTE  ELOISE RAFIQUE [id number]  HISTORY OF PRESENT ILLNESS: I had the pleasure of seeing Spencer Mendoza in follow-up in the neurology clinic on 03/01/2014.  The patient was last seen a month ago for severe headaches and seizures, and is again accompanied by his wife today.  Records and images were personally reviewed where available. Since his last visit, he has been to [facility] ER at least twice for severe headaches.  He had run out of Depakote at the beginning of the month and been unable to fill it due to financial constraints.  He was evaluated by neurology and recommended to restart Depakote with uptitration schedule.  He is currently on Depakote ER 500mg  qhs started yesterday.  He had been taking Topamax 100mg  BID but they felt that this was causing weight loss, increased irritability, and change in his mental status.  He is now weaning this off taking 100mg  daily, with plans to discontinue over the next 2 weeks.  He has been having daily diarrhea since February, with "just water" occurring 5-6 times a day.  He has been having nausea and vomiting, phenergan helps better.  He has not had a headache in 3 days, but feels "lousy."  No seizures since 01/17/14.  I reviewed his 24-hour EEG which was normal, no epileptiform discharges or electrographic seizures seen.  He did not write down symptoms, but reported that he did have "petits" during the EEG, with no EEG changes seen throughout the study.  HPI: This is a 53 yo RH man with a history of seizures, migraines, alcohol abuse, and a diagnosis of bipolar disorder, admitted to [facility] on 12/03/13 for headache, vomiting, right-sided weakness, and inability to speak. Stroke workup was unremarkable, I personally reviewed his MRI brain and MRA head without contrast which did not show any acute changes. Carotid dopplers showed a 40-59% ICA stenosis on right, left 1-39%. 2-D echo showed EF of 60-65%, normal wall motion. He had  a routine EEG reported as normal. His urine drug screen was positive for THC, alcohol level was elevated. 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 He was discharged with Tramadol which he ran out of. He continues to have the severe headaches with associated nausea, vomiting, photo/phonophobia. He has taken Tylenol, Aleve, Goody powder, with minimal effect. When he was smoking marijuana, this helped alleviate the headaches.   1. Migraines. He has a history of migraine headaches since childhood. He recalls several ER visits as a child since age 29 or 68, with associated nausea/vomiting. He would receive phenergan and Demerol in the past. Headaches significantly increased in frequency, initially over the right hemisphere, now over the occipital and frontal regions, with a throbbing pain, and associated nausea/vomiting, photo/phonophobia, and dizziness. He has visual auras with seeing colors and moving dots prior to a headache, sometimes occurring the day before headache onset. Triggers include red wine and stress. They endorse a significant amount of stress due to social issues (unemployed, now living with his father who has metastatic cancer).   2. Seizures. He has a history of seizures since childhood. He recalls having problems at school due to inattention, then started having "petits" at age 53 where he has eye fluttering, losing train of thought, staring and unresponsive for a minute or so. He feels that he knows what he wants to say but cannot get the words out, which aggravates him. His wife describes him looking  at his hands with possible automatisms in both hands, no lip smacking or dystonic posturing. He typically looks toward the right side during the seizures. Occasionally his left leg twitches. He had his first GTC at age 72, preceded by a "petit one." He recalls being prescribed Valium, Librium, and Dilantin, then switched to  Phenobarbital and Dilantin. On this combination, he continued to have the smaller seizures, but GTCs decreased to 1-2 a year. 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 His continues to have GTCs every few months, last GTC was in December, prior to this was in August. He continues to have the "petits" every few weeks, in September he fell off the porch. He has injured himself with the seizures, as well as had urinary incontinence. There is some report that he had alcohol withdrawal seizures in the past. He used to drink an 18-pack beer/day, now reduced to 1-2 beers/day.   3. Bipolar disorder. He endorses being diagnosed with bipolar disorder, but states that he does not agree and is concerned about the stigma. This was apparently diagnosed due to mood swings. His wife reports that since the fall in September, she has noticed a change in his personality, he becomes very angry, "flips 180 degrees" when he has worsening headaches or seizures. His wife reports talking in his sleep saying "I hate..." He and his wife are concerned because he has "never been this mean."   PAST MEDICAL HISTORY: Past Medical History  Diagnosis Date  . Epilepsy   . Migraine     MEDICATIONS: Current Outpatient Prescriptions on File Prior to Visit  Medication Sig Dispense Refill  . cetirizine (ZYRTEC) 10 MG tablet Take 10 mg by mouth daily.      [name] docusate sodium (COLACE) 100 MG capsule Take 1 capsule (100 mg total) by mouth 2 (two) times daily.  10 capsule  0  . folic acid (FOLVITE) 1 MG tablet Take 1 tablet (1 mg total) by mouth daily.  30 tablet  3  . Magnesium 400 MG CAPS Take 1 capsule by mouth daily.  30 capsule  6  . metoCLOPramide (REGLAN) 10 MG tablet Take 1 tablet (10 mg total) by mouth as needed for nausea.  30 tablet  3  . Multiple Vitamins-Minerals (MULTIVITAMIN WITH MINERALS) tablet Take 1 tablet by mouth daily.      . nicotine (NICODERM CQ) 21 mg/24hr patch Place 1 patch (21 mg  total) onto the skin daily.  28 patch  0  . Riboflavin 400 MG CAPS Take 1 capsule (400 mg total) by mouth daily.  30 capsule  6  . thiamine 100 MG tablet Take 1 tablet (100 mg total) by mouth daily.  30 tablet  0  . topiramate (TOPAMAX) 50 MG tablet 2 tabs twice a day  120 tablet  4  . traMADol (ULTRAM) 50 MG tablet Take 1 tablet (50 mg total) by mouth every 6 (six) hours as needed for moderate pain or severe pain.  30 tablet  0  . zolmitriptan (ZOMIG) 5 MG nasal solution Place 1 spray into the nose as needed for migraine (spray in nostril at onset of headache, may take second dose after 1 hour. Do not use more than 3 a week).  6 Units  3   No current facility-administered medications on file prior to visit.    ALLERGIES: Allergies  Allergen Reactions  . Codeine     FAMILY HISTORY: Family History  Problem Relation Age  of Onset  . Heart disease Mother   . Stroke Mother   . Heart disease Father   . Hypertension Father     SOCIAL HISTORY: History   Social History  . Marital Status: Divorced    Spouse Name: N/A    Number of Children: N/A  . Years of Education: N/A   Occupational History  . Operates heavy machinery    Social History Main Topics  . Smoking status: Current Every Day Smoker -- 1.00 packs/day for 35 years  . Smokeless tobacco: Not on file  . Alcohol Use: Yes     Comment: Drinks 4-5 beers daily  . Drug Use: No     Comment: last use many years ago   . Sexual Activity: Not on file   Other Topics Concern  . Not on file   Social History Narrative   Pt lives with significant other.     REVIEW OF SYSTEMS: Constitutional: No fevers, chills, or sweats, no generalized fatigue, change in appetite Eyes: No visual changes, double vision, eye pain Ear, nose and throat: No hearing loss, ear pain, nasal congestion, sore throat Cardiovascular: No chest pain, palpitations Respiratory:  No shortness of breath at rest or with exertion, wheezes GastrointestinaI: No  nausea, vomiting, diarrhea, abdominal pain, fecal incontinence Genitourinary:  No dysuria, urinary retention or frequency Musculoskeletal:  No neck pain, back pain Integumentary: No rash, pruritus, skin lesions Neurological: as above Psychiatric: No depression, insomnia, anxiety Endocrine: No palpitations, fatigue, diaphoresis, mood swings, change in appetite, change in weight, increased thirst Hematologic/Lymphatic:  No anemia, purpura, petechiae. Allergic/Immunologic: no itchy/runny eyes, nasal congestion, recent allergic reactions, rashes  PHYSICAL EXAM: There were no vitals filed for this visit. General: No acute distress Head:  Normocephalic/atraumatic Neck: supple, no paraspinal tenderness, full range of motion Heart:  Regular rate and rhythm Lungs:  Clear to auscultation bilaterally Back: No paraspinal tenderness Skin/Extremities: No rash, no edema Neurological Exam: alert and oriented to person, place, and time. No aphasia or dysarthria. Fund of knowledge is appropriate.  Recent and remote memory are intact.  Attention and concentration are normal.    Able to name objects and repeat phrases. Cranial nerves: Pupils equal, round, reactive to light.  Fundoscopic exam unremarkable, no papilledema. Extraocular movements intact with no nystagmus. Visual fields full. Facial sensation intact. No facial asymmetry. Tongue, uvula, palate midline.  Motor: Bulk and tone normal, muscle strength 5/5 throughout with no pronator drift.  Sensation to light touch, temperature and vibration intact.  No extinction to double simultaneous stimulation.  Deep tendon reflexes 2+ throughout, toes downgoing.  Finger to nose testing intact.  Gait narrow-based and steady, able to tandem walk adequately.  Romberg negative.  IMPRESSION: This is a 53 yo RH man with a history of intractable migraines and seizures. He feels better today, he was at [facility] ER a few days ago and was instructed to restart Depakote.  He will be  uptitrating this to 500mg  BID.  He appears more comfortable today, less photosensitivity, he is not using the crutches, although he appears to be depressed.  We discussed his 24-hour EEG which was normal, he reported having "petits" during the test with no EEG changes seen throughout the study.  I am concerned about non-epileptic seizures, they deny any convulsions since 01/17/14.  Continue Depakote for headache and seizure prophylaxis.  He feels he is having side effects on the Topamax and they have been weaning him off it, he will be stopping this in 2 weeks.  I discussed with them headache prophylactic medications, he may benefit from Effexor to help with headaches and mood.  Side effects were discussed.  He will start Effexor 37.5mg  at bedtime, with plans for uptitration.  He would like a second opinion for the headaches from the [facility], referral has been sent.  He will follow-up in 3 months.  He is aware of [place] driving laws and he knows not to drive until 6 months seizure-free. He will speak with his PCP regarding the diarrhea for the past 2 months.  Thank you for allowing me to participate in his care.  Please do not hesitate to call for any questions or concerns.  The duration of this appointment visit was 15 minutes of face-to-face time with the patient.  Greater than 50% of this time was spent in counseling, explanation of diagnosis, planning of further management, and coordination of care.   Ellouise Newer, M.D.

## 2014-04-04 ENCOUNTER — Ambulatory Visit: Payer: Self-pay | Admitting: Internal Medicine

## 2014-08-07 ENCOUNTER — Ambulatory Visit (INDEPENDENT_AMBULATORY_CARE_PROVIDER_SITE_OTHER): Payer: Medicaid Other | Admitting: Neurology

## 2014-08-07 ENCOUNTER — Encounter: Payer: Self-pay | Admitting: Neurology

## 2014-08-07 VITALS — BP 130/82 | HR 63 | Resp 18 | Ht 72.0 in | Wt 180.0 lb

## 2014-08-07 DIAGNOSIS — R569 Unspecified convulsions: Secondary | ICD-10-CM

## 2014-08-07 DIAGNOSIS — R51 Headache: Secondary | ICD-10-CM

## 2014-08-07 NOTE — Progress Notes (Signed)
NEUROLOGY FOLLOW UP OFFICE NOTE  Spencer Mendoza [id number]  HISTORY OF PRESENT ILLNESS: I had the pleasure of seeing Spencer Mendoza in follow-up in the neurology clinic on 08/08/2014.  The patient was last seen 5 months ago for severe headaches and seizures, and is again accompanied by his wife today.  On his last visit, he was taking Depakote and tapering Topamax. He had a 24-hour EEG which was nromal, he reported having "petits" during the test with no EEG changes seen throughout the study. Concern is for non-epileptic events.  He denies any convulsions since March 2015. He continued to reports headaches as well as depression, and started Effexor.  He has been taking 37.5mg  1/2 tab BID for the past 5 months (did not uptitrate as instructed) and states that if he misses a night dose, he would have a "petit."  His wife has not witnessed any seizures.   He had tremors on Depakote and self-tapered it over a few weeks, last dose last week, no further tremors.  His wife feels he is doing much better with regards to mood, however he continues to have the daily headaches. He had [facility] on his last visit, however he had some insurance issues. He tells me today that he has spoken to the clinic on the phone and they would like him to be tapered off the Effexor. He comes today requesting a tapering schedule so he could proceed with Headache Clinic visit.  HPI: This is a 53 yo RH man with a history of seizures, migraines, alcohol abuse, and a diagnosis of bipolar disorder, admitted to [facility] on 12/03/13 for headache, vomiting, right-sided weakness, and inability to speak. Stroke workup was unremarkable, I personally reviewed his MRI brain and MRA head without contrast which did not show any acute changes. Carotid dopplers showed a 40-59% ICA stenosis on right, left 1-39%. 2-D echo showed EF of 60-65%, normal wall motion. He had a routine EEG reported as normal. His urine drug screen was  positive for THC, alcohol level was elevated. 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 He was discharged with Tramadol which he ran out of. He continues to have the severe headaches with associated nausea, vomiting, photo/phonophobia. He has taken Tylenol, Aleve, Goody powder, with minimal effect. When he was smoking marijuana, this helped alleviate the headaches.   1. Migraines. He has a history of migraine headaches since childhood. He recalls several ER visits as a child since age 42 or 67, with associated nausea/vomiting. He would receive phenergan and Demerol in the past. Headaches significantly increased in frequency, initially over the right hemisphere, now over the occipital and frontal regions, with a throbbing pain, and associated nausea/vomiting, photo/phonophobia, and dizziness. He has visual auras with seeing colors and moving dots prior to a headache, sometimes occurring the day before headache onset. Triggers include red wine and stress. They endorse a significant amount of stress due to social issues (unemployed, now living with his father who has metastatic cancer).   2. Seizures. He has a history of seizures since childhood. He recalls having problems at school due to inattention, then started having "petits" at age 53 where he has eye fluttering, losing train of thought, staring and unresponsive for a minute or so. He feels that he knows what he wants to say but cannot get the words out, which aggravates him. His wife describes him looking at his hands with possible  automatisms in both hands, no lip smacking or dystonic posturing. He typically looks toward the right side during the seizures. Occasionally his left leg twitches. He had his first GTC at age 53, preceded by a "petit one." He recalls being prescribed Valium, Librium, and Dilantin, then switched to Phenobarbital and Dilantin. On this combination, he continued to  have the smaller seizures, but GTCs decreased to 1-2 a year. 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 His continues to have GTCs every few months. He continues to have the "petits" every few weeks, in September he fell off the porch. He has injured himself with the seizures, as well as had urinary incontinence. There is some report that he had alcohol withdrawal seizures in the past. He used to drink an 18-pack beer/day, now reduced to 1-2 beers/day.   3. Bipolar disorder. He endorses being diagnosed with bipolar disorder, but states that he does not agree and is concerned about the stigma. This was apparently diagnosed due to mood swings. His wife reports that since the fall in September, she has noticed a change in his personality, he becomes very angry, "flips 180 degrees" when he has worsening headaches or seizures. His wife reports talking in his sleep saying "I hate..." He and his wife are concerned because he has "never been this mean."   Diagnostic Data: I reviewed his 24-hour EEG which was normal, no epileptiform discharges or electrographic seizures seen. He did not write down symptoms, but reported that he did have "petits" during the EEG, with no EEG changes seen throughout the study. MRI brain and MRA head without contrast which did not show any acute changes.  PAST MEDICAL HISTORY: Past Medical History  Diagnosis Date  . Epilepsy   . Migraine     MEDICATIONS: Current Outpatient Prescriptions on File Prior to Visit  Medication Sig Dispense Refill  . cetirizine (ZYRTEC) 10 MG tablet Take 10 mg by mouth daily.      Effexor 37.5mg  1/2 tab BID No current facility-administered medications on file prior to visit.    ALLERGIES: Allergies  Allergen Reactions  . Codeine     FAMILY HISTORY: Family History  Problem Relation Age of Onset  . Heart disease Mother   . Stroke Mother   . Heart disease Father   . Hypertension Father     SOCIAL  HISTORY: History   Social History  . Marital Status: Divorced    Spouse Name: N/A    Number of Children: N/A  . Years of Education: N/A   Occupational History  . Operates heavy machinery    Social History Main Topics  . Smoking status: Current Every Day Smoker -- 1.00 packs/day for 35 years  . Smokeless tobacco: Not on file  . Alcohol Use: Yes     Comment: Drinks 4-5 beers daily  . Drug Use: No     Comment: last use many years ago   . Sexual Activity: Not on file   Other Topics Concern  . Not on file   Social History Narrative   Pt lives with significant other.     REVIEW OF SYSTEMS: Constitutional: No fevers, chills, or sweats, no generalized fatigue, change in appetite Eyes: No visual changes, double vision, eye pain Ear, nose and throat: No hearing loss, ear pain, nasal congestion, sore throat Cardiovascular: No chest pain, palpitations Respiratory:  No shortness of breath at rest or with exertion, wheezes GastrointestinaI: No nausea, vomiting, diarrhea, abdominal pain, fecal  incontinence Genitourinary:  No dysuria, urinary retention or frequency Musculoskeletal:  No neck pain, back pain Integumentary: No rash, pruritus, skin lesions Neurological: as above Psychiatric: No depression, insomnia, anxiety Endocrine: No palpitations, fatigue, diaphoresis, mood swings, change in appetite, change in weight, increased thirst Hematologic/Lymphatic:  No anemia, purpura, petechiae. Allergic/Immunologic: no itchy/runny eyes, nasal congestion, recent allergic reactions, rashes  PHYSICAL EXAM: Filed Vitals:   08/07/14 1333  BP: 130/82  Pulse: 63  Resp: 18   General: No acute distress Head:  Normocephalic/atraumatic Neck: supple, no paraspinal tenderness, full range of motion Heart:  Regular rate and rhythm Lungs:  Clear to auscultation bilaterally Back: No paraspinal tenderness Skin/Extremities: No rash, no edema Neurological Exam: alert and oriented to person, place,  and time. No aphasia or dysarthria. Fund of knowledge is appropriate.  Recent and remote memory are intact.  Attention and concentration are normal.    Able to name objects and repeat phrases. Cranial nerves: Pupils equal, round, reactive to light.  Fundoscopic exam unremarkable, no papilledema. Extraocular movements intact with no nystagmus. Visual fields full. Facial sensation intact. No facial asymmetry. Tongue, uvula, palate midline.  Motor: Bulk and tone normal, muscle strength 5/5 throughout with no pronator drift.  Sensation to light touch, temperature and vibration intact.  No extinction to double simultaneous stimulation.  Deep tendon reflexes 2+ throughout, toes downgoing.  Finger to nose testing intact.  Gait narrow-based and steady, able to tandem walk adequately.  Romberg negative.  IMPRESSION: This is a 53 yo RH man with a history of intractable migraines and seizures. He has tapered off Topamax and Depakote due to side effects. He denies any seizures except when he misses a dose of Effexor.  His 24-hour EEG was normal, he reported having "petits" during the test with no EEG changes seen throughout the study. I am concerned about non-epileptic seizures, they deny any convulsions since 01/17/14.  He is on low dose Effexor 37.5mg  1/2 tab BID and had requested a second opinion at [facility] months ago. He comes today asking for a tapering schedule for the Effexor, per patient the [facility] is requesting documentation of this so he could proceed with his visit there.  We discussed monitoring symptoms with tapering the medication, he understands and would like to proceed. He was given a schedule to taper off in the next 2 weeks.  He will be seeing the headache specialists at the [facility] moving forward, and knows to call our office for any problems. Follow-up prn basis.  Thank you for allowing me to participate in his care.  Please do not hesitate to call for any  questions or concerns.  The duration of this appointment visit was 15 minutes of face-to-face time with the patient.  Greater than 50% of this time was spent in counseling, explanation of diagnosis, planning of further management, and coordination of care.   Ellouise Newer, M.D.   CC: Dr. Annitta Needs

## 2014-08-07 NOTE — Patient Instructions (Signed)
1. Start reducing Effexor: take 1/2 tab at night for 1 week, 1/2 tab every other night for 1 week, then stop 2. Follow-up with [facility] as scheduled 3. Follow-up with our office on an as needed basis

## 2014-08-08 ENCOUNTER — Encounter: Payer: Self-pay | Admitting: Neurology

## 2014-08-08 DIAGNOSIS — R519 Headache, unspecified: Secondary | ICD-10-CM | POA: Insufficient documentation

## 2014-08-08 DIAGNOSIS — R51 Headache: Secondary | ICD-10-CM

## 2014-08-16 ENCOUNTER — Telehealth: Payer: Self-pay | Admitting: Neurology

## 2014-08-16 NOTE — Telephone Encounter (Signed)
Pt wife [name] called and wants to talk to someone she has some questions please call [phone]

## 2014-08-16 NOTE — Telephone Encounter (Signed)
Spoke with patient. He states that he is having issues with coming off of the Effexor. He states that he's been having petite seizures & feeling "swishy headed". States that it might possibly be 1 month to 6 weeks before the [facility] can get him in for study. He states he would like to go back on Effexor. He also states that he's not taking Depakote anymore. Wants to know if dose can possibly be increased or put on another medication in addition to what he is already taking. Please Advise.

## 2014-08-17 NOTE — Telephone Encounter (Signed)
Go back to 1/2 tablet twice a day. This can be further increased if he is interested. After 1 week, go to 1 tab twice a day. He will have to let us know when he needs to be off medication for [facility] and if they have any medication list that patient can take to be on that study. Thanks

## 2014-08-17 NOTE — Telephone Encounter (Signed)
Lmom to return my call. 

## 2014-08-17 NOTE — Telephone Encounter (Signed)
Returning your call please call pt wife [name]

## 2014-08-18 ENCOUNTER — Telehealth: Payer: Self-pay | Admitting: Family Medicine

## 2014-08-18 MED ORDER — VENLAFAXINE HCL 37.5 MG PO TABS: 37.5000 mg | ORAL_TABLET | Freq: Two times a day (BID) | ORAL | Status: DC

## 2014-08-18 MED ORDER — VENLAFAXINE HCL 37.5 MG PO TABS: ORAL_TABLET | ORAL | Status: DC

## 2014-08-18 NOTE — Telephone Encounter (Signed)
I spoke with him & his wife. He wants to try higher dose. I can cancel Rx from earlier & call in new one.

## 2014-08-18 NOTE — Telephone Encounter (Signed)
Message copied by Thurmon Fair on Fri Aug 18, 2014 11:24 AM ------      Message from: Blenda Peals      Created: Fri Aug 18, 2014 11:11 AM      Regarding: Peter Congo from [facility]      Contact: [phone]       Please call Dianna. She has a questions regarding pt's script.            Thanks.  ------

## 2014-08-18 NOTE — Telephone Encounter (Signed)
He is on low dose, I would increase this rather than add another medication. Would defer to [facility] for other medications, particularly since they would like him to be off them when seen in their clinic. Thanks

## 2014-08-18 NOTE — Telephone Encounter (Signed)
Returned call to [employer] w/[facility] [facility].   She wanted to clarify pt instructions for Effexor. Patient had been taking 1 tab bid, which he had been weaning off of. Now he is going to go back to original dose with a titrate up to what he had been taking.

## 2014-08-18 NOTE — Telephone Encounter (Signed)
Lmom to return my call. 

## 2014-08-18 NOTE — Telephone Encounter (Signed)
Thanks, pls send in Rx for Effexor 37.5mg  1 tab BID x 1 week, then increase to 2 tabs BID. Thanks

## 2014-08-18 NOTE — Telephone Encounter (Signed)
Spoke with pts wife. Patient is going to follow your instructions for the Effexor. They want to know if there is anything else he can take along with that to help with his headaches since he had issues with Topamax & Depakote. He still hasn't had any convulsions. Please advise. I did send in new Rx for Effexor.

## 2014-08-18 NOTE — Telephone Encounter (Signed)
Spoke to pts wife/Michelle & notified of new instructions. I did call pharmacy & cancelled earlier Rx that was sent in & sent in new Rx with new sig. Patient is going to try and get established with the [facility] with 1 of their doctors. They are waiting to hear back from Medicaid to see if these visits will be covered.

## 2014-08-18 NOTE — Telephone Encounter (Signed)
[name], Pt's wife called/returning your call. C/B [phone]

## 2014-10-19 ENCOUNTER — Telehealth: Payer: Self-pay | Admitting: Neurology

## 2014-10-19 NOTE — Telephone Encounter (Signed)
Tried calling patient on number listed as home # no answer; phone just rings continuously. Tried number listed as cell phone number for patient and his wife and that was the wrong number for the patient.

## 2014-10-19 NOTE — Telephone Encounter (Signed)
Pt wants to consider new meds, please call  / Sherri

## 2014-10-19 NOTE — Telephone Encounter (Signed)
Wants to consider new meds, please call [phone] / Sherri S>

## 2014-10-20 NOTE — Telephone Encounter (Signed)
Called patient again was able to speak with him. He wants to know if there is something else you can Rx to him for his headaches. He states the Effexor isn't working & he has been taking a lot of BC powder, which is now starting to cause him stomach pain. He also states that he is not sleeping due to the headaches waking him up and wants to know if you would give him something for sleep. He states he wasn't able to get into the headache clinic due to losing his [employer]. Please advise. He is aware that you are out of the office until Wed and is ok to wait until then.

## 2014-10-25 MED ORDER — AMITRIPTYLINE HCL 25 MG PO TABS: 25.0000 mg | ORAL_TABLET | Freq: Every day | ORAL | Status: DC

## 2014-10-25 NOTE — Telephone Encounter (Signed)
Would start amitriptyline 25mg  at bedtime. This helps with headaches, side effect is drowsiness, so hopefully will help with sleep as well. Reduce intake of BC powder or any other over the counter medication to 2-3 a week, otherwise he will have rebound headaches due to overuse of pain medications. Thanks

## 2014-10-25 NOTE — Telephone Encounter (Signed)
Patient was notified of advisement. Rx was sent to pharmacy.

## 2014-11-20 ENCOUNTER — Emergency Department (HOSPITAL_COMMUNITY): Payer: Medicaid Other

## 2014-11-20 ENCOUNTER — Other Ambulatory Visit: Payer: Self-pay | Admitting: Neurology

## 2014-11-20 ENCOUNTER — Encounter (HOSPITAL_COMMUNITY): Payer: Self-pay | Admitting: Cardiology

## 2014-11-20 ENCOUNTER — Emergency Department (HOSPITAL_COMMUNITY)
Admission: EM | Admit: 2014-11-20 | Discharge: 2014-11-21 | Disposition: A | Discharge status: Home / Self Care | Payer: Self-pay | Attending: Emergency Medicine | Admitting: Emergency Medicine

## 2014-11-20 ENCOUNTER — Emergency Department (HOSPITAL_COMMUNITY): Payer: Self-pay

## 2014-11-20 DIAGNOSIS — F10921 Alcohol use, unspecified with intoxication delirium: Secondary | ICD-10-CM

## 2014-11-20 DIAGNOSIS — Z79899 Other long term (current) drug therapy: Secondary | ICD-10-CM | POA: Insufficient documentation

## 2014-11-20 DIAGNOSIS — Z720 Tobacco use: Secondary | ICD-10-CM | POA: Insufficient documentation

## 2014-11-20 DIAGNOSIS — F10121 Alcohol abuse with intoxication delirium: Secondary | ICD-10-CM | POA: Insufficient documentation

## 2014-11-20 DIAGNOSIS — G43909 Migraine, unspecified, not intractable, without status migrainosus: Secondary | ICD-10-CM | POA: Insufficient documentation

## 2014-11-20 DIAGNOSIS — R61 Generalized hyperhidrosis: Secondary | ICD-10-CM | POA: Insufficient documentation

## 2014-11-20 LAB — COMPREHENSIVE METABOLIC PANEL
ALT: 16 U/L (ref 0–53)
AST: 26 U/L (ref 0–37)
Albumin: 4.6 g/dL (ref 3.5–5.2)
Alkaline Phosphatase: 94 U/L (ref 39–117)
Anion gap: 15 (ref 5–15)
BUN: 7 mg/dL (ref 6–23)
CO2: 22 mmol/L (ref 19–32)
Calcium: 9.6 mg/dL (ref 8.4–10.5)
Chloride: 102 mEq/L (ref 96–112)
Creatinine, Ser: 0.95 mg/dL (ref 0.50–1.35)
GFR calc Af Amer: >90 mL/min (ref >90)
GFR calc non Af Amer: >90 mL/min (ref >90)
Glucose, Bld: 111 mg/dL — ABNORMAL HIGH (ref 70–99)
Potassium: 3.9 mmol/L (ref 3.5–5.1)
Sodium: 139 mmol/L (ref 135–145)
Total Bilirubin: 0.6 mg/dL (ref 0.3–1.2)
Total Protein: 7.6 g/dL (ref 6.0–8.3)

## 2014-11-20 LAB — PROTIME-INR
INR: 0.93 (ref 0.00–1.49)
Prothrombin Time: 12.6 seconds (ref 11.6–15.2)

## 2014-11-20 LAB — CBC WITH DIFFERENTIAL/PLATELET
Basophils Absolute: 0.1 10*3/uL (ref 0.0–0.1)
Basophils Relative: 1 % (ref 0–1)
Eosinophils Absolute: 0.2 10*3/uL (ref 0.0–0.7)
Eosinophils Relative: 2 % (ref 0–5)
HCT: 48.2 % (ref 39.0–52.0)
Hemoglobin: 17.1 g/dL — ABNORMAL HIGH (ref 13.0–17.0)
Lymphocytes Relative: 38 % (ref 12–46)
Lymphs Abs: 3.4 10*3/uL (ref 0.7–4.0)
MCH: 31.7 pg (ref 26.0–34.0)
MCHC: 35.5 g/dL (ref 30.0–36.0)
MCV: 89.3 fL (ref 78.0–100.0)
Monocytes Absolute: 0.7 10*3/uL (ref 0.1–1.0)
Monocytes Relative: 8 % (ref 3–12)
Neutro Abs: 4.6 10*3/uL (ref 1.7–7.7)
Neutrophils Relative %: 51 % (ref 43–77)
Platelets: 337 10*3/uL (ref 150–400)
RBC: 5.4 MIL/uL (ref 4.22–5.81)
RDW: 14.5 % (ref 11.5–15.5)
WBC: 8.9 10*3/uL (ref 4.0–10.5)

## 2014-11-20 LAB — ETHANOL: Alcohol, Ethyl (B): 262 mg/dL — ABNORMAL HIGH (ref 0–9)

## 2014-11-20 LAB — LACTIC ACID, PLASMA: Lactic Acid, Venous: 2.2 mmol/L (ref 0.5–2.2)

## 2014-11-20 MED ORDER — SODIUM CHLORIDE 0.9 % IV SOLN
1000.0000 mL | INTRAVENOUS | Status: DC
  Administered 2014-11-20: 1000 mL via INTRAVENOUS

## 2014-11-20 MED ORDER — DIPHENHYDRAMINE HCL 50 MG/ML IJ SOLN
25.0000 mg | Freq: Once | INTRAMUSCULAR | Status: AC
  Administered 2014-11-20: 25 mg via INTRAVENOUS
  Filled 2014-11-20: qty 1

## 2014-11-20 MED ORDER — METOCLOPRAMIDE HCL 5 MG/ML IJ SOLN
10.0000 mg | Freq: Once | INTRAMUSCULAR | Status: AC
  Administered 2014-11-20: 10 mg via INTRAVENOUS
  Filled 2014-11-20: qty 2

## 2014-11-20 MED ORDER — SODIUM CHLORIDE 0.9 % IV SOLN
1000.0000 mL | Freq: Once | INTRAVENOUS | Status: AC
  Administered 2014-11-20: 1000 mL via INTRAVENOUS

## 2014-11-20 MED ORDER — SODIUM CHLORIDE 0.9 % IV BOLUS (SEPSIS)
1000.0000 mL | Freq: Once | INTRAVENOUS | Status: AC
  Administered 2014-11-20: 1000 mL via INTRAVENOUS

## 2014-11-20 MED ORDER — THIAMINE HCL 100 MG/ML IJ SOLN
100.0000 mg | Freq: Once | INTRAMUSCULAR | Status: AC
  Administered 2014-11-20: 100 mg via INTRAVENOUS
  Filled 2014-11-20: qty 2

## 2014-11-20 MED ORDER — KETOROLAC TROMETHAMINE 30 MG/ML IJ SOLN
30.0000 mg | Freq: Once | INTRAMUSCULAR | Status: AC
  Administered 2014-11-20: 30 mg via INTRAVENOUS
  Filled 2014-11-20: qty 1

## 2014-11-20 MED ORDER — LORAZEPAM 2 MG/ML IJ SOLN
1.0000 mg | Freq: Once | INTRAMUSCULAR | Status: AC
  Administered 2014-11-20: 1 mg via INTRAVENOUS
  Filled 2014-11-20: qty 1

## 2014-11-20 NOTE — ED Notes (Signed)
Patient transported to CT 
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
## 2014-11-20 NOTE — ED Notes (Signed)
Family reports that the patient had a seizure on Saturday and fell hitting his head. Pt has been disoriented since then, and having n/v. Pt with dry heaves at triage.

## 2014-11-20 NOTE — ED Provider Notes (Signed)
CSN: [id number]     Arrival date & time 11/20/14  1837 History   First MD Initiated Contact with Patient 11/20/14 1904     Chief Complaint  Patient presents with  . Headache  . Fall     (Consider location/radiation/quality/duration/timing/severity/associated sxs/prior Treatment) HPI  Patient presents with concern of ongoing headache, change in mental status for 2 days. History of present illness is provided by the patient's sister, as the patient is incessantly complaining about head pain, answers all questions with the response that his head hurts. Patient has a history of epilepsy, as well as migraines. Family notes that he had a typical seizure 2 days ago.  Since that time the patient has continued to complain of head pain, has been repetitive, perseverates, not interacting appropriately. No report of substantial trauma during the event. According to family, the patient has substantial life stress, including recent separation from his wife. Patient seemingly take all medication as directed. Level V caveat secondary to change in mental status.   Past Medical History  Diagnosis Date  . Epilepsy   . Migraine    Past Surgical History  Procedure Laterality Date  . Knee arthroscopy Right   . Appendectomy     Family History  Problem Relation Age of Onset  . Heart disease Mother   . Stroke Mother   . Heart disease Father   . Hypertension Father    History  Substance Use Topics  . Smoking status: Current Every Day Smoker -- 1.00 packs/day for 35 years  . Smokeless tobacco: Not on file  . Alcohol Use: Yes     Comment: Drinks 4-5 beers daily    Review of Systems  Unable to perform ROS: Mental status change   Sister states that the patient is the primary caregiver for their father.  He has Stage IV melanoma. She states that the patient has been drinking since he was 11.  Allergies  Codeine  Home Medications   Prior to Admission medications   Medication Sig Start Date  End Date Taking? Authorizing Provider  amitriptyline (ELAVIL) 25 MG tablet Take 1 tablet (25 mg total) by mouth at bedtime. 10/25/14  Yes Cameron Sprang, MD  cetirizine (ZYRTEC) 10 MG tablet Take 10 mg by mouth daily.   Yes Historical Provider, MD  venlafaxine (EFFEXOR) 37.5 MG tablet Take 2 tablets twice daily 11/20/14  Yes Cameron Sprang, MD   BP 99/58 mmHg  Pulse 65  Temp(Src) 99.7 F (37.6 C) (Oral)  Resp 21  Wt 180 lb (81.647 kg)  SpO2 96% Physical Exam  Constitutional: He appears well-developed. He appears distressed.  Disheveled, uncomfortable appearing male  HENT:  Head: Normocephalic and atraumatic.  Eyes: Conjunctivae and EOM are normal.  Cardiovascular: Normal rate and regular rhythm.   Pulmonary/Chest: Effort normal. No stridor. No respiratory distress.  Abdominal: He exhibits no distension.  Musculoskeletal: He exhibits no edema.  Neurological: He is alert.  Patient does not answer questions properly, but does move all extremities spontaneously, has no facial asymmetry, no speech deficit.  Skin: Skin is warm. He is diaphoretic.  Psychiatric: His mood appears anxious. Cognition and memory are impaired. He expresses impulsivity.  Nursing note and vitals reviewed.   ED Course  Procedures (including critical care time) Labs Review Labs Reviewed  CBC WITH DIFFERENTIAL - Abnormal; Notable for the following:    Hemoglobin 17.1 (*)    All other components within normal limits  COMPREHENSIVE METABOLIC PANEL - Abnormal; Notable for the following:  Glucose, Bld 111 (*)    All other components within normal limits  ETHANOL - Abnormal; Notable for the following:    Alcohol, Ethyl (B) 262 (*)    All other components within normal limits  LACTIC ACID, PLASMA  PROTIME-INR    Imaging Review Ct Head Wo Contrast  11/20/2014   CLINICAL DATA:  Initial evaluation for disorientation with nausea and vomiting, patient had seizure 3 days ago and fell hitting his head  EXAM: CT HEAD  WITHOUT CONTRAST  TECHNIQUE: Contiguous axial images were obtained from the base of the skull through the vertex without intravenous contrast.  COMPARISON:  12/04/2013, 12/06/2013  FINDINGS: There is mild diffuse atrophy. There is no abnormal attenuation to suggest hemorrhage or mass. No evidence of infarct or extra-axial fluid. No hydrocephalus. No skull fracture. Mild to moderate inflammatory change in the ethmoid air cells.  IMPRESSION: Involutional change with no acute findings.   Electronically Signed   By: Skipper Cliche M.D.   On: 11/20/2014 20:38   Dg Chest Port 1 View  11/20/2014   CLINICAL DATA:  Family reports that the patient had a seizure on Saturday and fell hitting his head. Pt has been disoriented since then, and having n/v. Pt with dry heaves at triage. H/o seizures and migraines. Pt unable to lay completely still for x-ray due to pain and vomiting.  EXAM: PORTABLE CHEST - 1 VIEW  COMPARISON:  08/02/2013.  FINDINGS: The left lateral lung base is not included. The included lungs are clear. Normal sized heart. No fracture or pneumothorax seen. Small amount of right neck calcification.  IMPRESSION: 1. No acute abnormality. 2. The left lateral lung base is excluded. 3. Small amount of right carotid artery atheromatous calcification.   Electronically Signed   By: Enrique Sack M.D.   On: 11/20/2014 19:23     EKG Interpretation   Date/Time:  Monday November 20 2014 19:47:07 EST Ventricular Rate:  75 PR Interval:  191 QRS Duration: 110 QT Interval:  394 QTC Calculation: 440 R Axis:   128 Text Interpretation:  Sinus rhythm Anteroseptal infarct, age indeterminate  Sinus rhythm T wave abnormality Abnormal ekg Confirmed by Carmin Muskrat   MD [phone]) on 11/20/2014 7:51:45 PM     On repeat exam the patient appears more comfortable.  On repeat exam the patient is resting. All labs are back, and I discussed all findings with the patient's sister. She states the patient has a long history of  alcohol abuse. Patient's blood alcohol level is 266. sister states that the patient is denied drinking recently. Patient is also voiced no interest in rehab.  She states that his behavior / state is typical for him with intoxication.  12:01 AM Patient speaking, in no distress.   MDM   Patient presents with ongoing altered mental status, some familial concern for prolonged postictal phase. Patient's evaluation is largely consistent with prior evaluations, with a abnormal, but not acutely changed head CT, reassuring labs, but alcohol intoxication. Patient received fluid rehydration, and after several hours of monitoring, with no subsequent changes condition, he was discharged to the care of his family member. Patient will follow up with neurology as an outpatient for consideration of additional therapy for migraine headaches and alcohol abuse.     Carmin Muskrat, MD 11/21/14 [phone]

## 2014-11-21 NOTE — Discharge Instructions (Signed)
As discussed, it is very important that you drink only in moderation, and be sure to follow-up with your primary care physician/neurologist for additional evaluation and management regarding tonight's emergency department evaluation.  Return here for concerning changes in your condition.

## 2014-11-27 ENCOUNTER — Encounter (HOSPITAL_COMMUNITY): Payer: Self-pay | Admitting: *Deleted

## 2014-11-27 ENCOUNTER — Emergency Department (HOSPITAL_COMMUNITY)
Admission: EM | Admit: 2014-11-27 | Discharge: 2014-11-28 | Disposition: A | Discharge status: Home / Self Care | Payer: Federal, State, Local not specified - Other | Attending: Emergency Medicine | Admitting: Emergency Medicine

## 2014-11-27 DIAGNOSIS — G43909 Migraine, unspecified, not intractable, without status migrainosus: Secondary | ICD-10-CM | POA: Insufficient documentation

## 2014-11-27 DIAGNOSIS — F1210 Cannabis abuse, uncomplicated: Secondary | ICD-10-CM | POA: Insufficient documentation

## 2014-11-27 DIAGNOSIS — F1010 Alcohol abuse, uncomplicated: Secondary | ICD-10-CM | POA: Insufficient documentation

## 2014-11-27 DIAGNOSIS — Z79899 Other long term (current) drug therapy: Secondary | ICD-10-CM | POA: Insufficient documentation

## 2014-11-27 DIAGNOSIS — G40909 Epilepsy, unspecified, not intractable, without status epilepticus: Secondary | ICD-10-CM | POA: Insufficient documentation

## 2014-11-27 DIAGNOSIS — Z720 Tobacco use: Secondary | ICD-10-CM | POA: Insufficient documentation

## 2014-11-27 LAB — ACETAMINOPHEN LEVEL: Acetaminophen (Tylenol), Serum: <10 ug/mL — ABNORMAL LOW (ref 10–30)

## 2014-11-27 LAB — RAPID URINE DRUG SCREEN, HOSP PERFORMED
Amphetamines: NOT DETECTED
Barbiturates: NOT DETECTED
Benzodiazepines: NOT DETECTED
Cocaine: NOT DETECTED
Opiates: NOT DETECTED
Tetrahydrocannabinol: POSITIVE — AB

## 2014-11-27 LAB — COMPREHENSIVE METABOLIC PANEL
ALT: 23 U/L (ref 0–53)
AST: 36 U/L (ref 0–37)
Albumin: 4.9 g/dL (ref 3.5–5.2)
Alkaline Phosphatase: 84 U/L (ref 39–117)
Anion gap: 12 (ref 5–15)
BUN: 8 mg/dL (ref 6–23)
CO2: 27 mmol/L (ref 19–32)
Calcium: 9.8 mg/dL (ref 8.4–10.5)
Chloride: 96 mEq/L (ref 96–112)
Creatinine, Ser: 0.98 mg/dL (ref 0.50–1.35)
GFR calc Af Amer: >90 mL/min (ref >90)
GFR calc non Af Amer: >90 mL/min (ref >90)
Glucose, Bld: 105 mg/dL — ABNORMAL HIGH (ref 70–99)
Potassium: 3.8 mmol/L (ref 3.5–5.1)
Sodium: 135 mmol/L (ref 135–145)
Total Bilirubin: 0.4 mg/dL (ref 0.3–1.2)
Total Protein: 8.2 g/dL (ref 6.0–8.3)

## 2014-11-27 LAB — CBC
HCT: 47.3 % (ref 39.0–52.0)
Hemoglobin: 16.1 g/dL (ref 13.0–17.0)
MCH: 31.8 pg (ref 26.0–34.0)
MCHC: 34 g/dL (ref 30.0–36.0)
MCV: 93.5 fL (ref 78.0–100.0)
Platelets: 277 10*3/uL (ref 150–400)
RBC: 5.06 MIL/uL (ref 4.22–5.81)
RDW: 15 % (ref 11.5–15.5)
WBC: 7 10*3/uL (ref 4.0–10.5)

## 2014-11-27 LAB — ETHANOL: Alcohol, Ethyl (B): 276 mg/dL — ABNORMAL HIGH (ref 0–9)

## 2014-11-27 LAB — SALICYLATE LEVEL: Salicylate Lvl: <4 mg/dL (ref 2.8–20.0)

## 2014-11-27 MED ORDER — ZOLPIDEM TARTRATE 5 MG PO TABS: 5.0000 mg | ORAL_TABLET | Freq: Every evening | ORAL | Status: DC | PRN

## 2014-11-27 MED ORDER — LORAZEPAM 1 MG PO TABS
0.0000 mg | ORAL_TABLET | Freq: Four times a day (QID) | ORAL | Status: DC
  Administered 2014-11-27: 2 mg via ORAL
  Administered 2014-11-28 (×3): 1 mg via ORAL
  Filled 2014-11-27: qty 2
  Filled 2014-11-27 (×3): qty 1

## 2014-11-27 MED ORDER — LORAZEPAM 1 MG PO TABS: 0.0000 mg | ORAL_TABLET | Freq: Two times a day (BID) | ORAL | Status: DC

## 2014-11-27 MED ORDER — VENLAFAXINE HCL 75 MG PO TABS
75.0000 mg | ORAL_TABLET | Freq: Two times a day (BID) | ORAL | Status: DC
  Administered 2014-11-27 – 2014-11-28 (×2): 75 mg via ORAL
  Filled 2014-11-27 (×4): qty 1

## 2014-11-27 MED ORDER — IBUPROFEN 200 MG PO TABS
600.0000 mg | ORAL_TABLET | Freq: Three times a day (TID) | ORAL | Status: DC | PRN
  Administered 2014-11-28: 600 mg via ORAL
  Filled 2014-11-27 (×2): qty 3

## 2014-11-27 MED ORDER — AMITRIPTYLINE HCL 25 MG PO TABS
25.0000 mg | ORAL_TABLET | Freq: Every day | ORAL | Status: DC
  Administered 2014-11-27: 25 mg via ORAL
  Filled 2014-11-27: qty 1

## 2014-11-27 MED ORDER — ONDANSETRON HCL 4 MG PO TABS
4.0000 mg | ORAL_TABLET | Freq: Three times a day (TID) | ORAL | Status: DC | PRN
  Administered 2014-11-28: 4 mg via ORAL
  Filled 2014-11-27: qty 1

## 2014-11-27 MED ORDER — NICOTINE 21 MG/24HR TD PT24
21.0000 mg | MEDICATED_PATCH | Freq: Every day | TRANSDERMAL | Status: DC
  Administered 2014-11-28 (×2): 21 mg via TRANSDERMAL
  Filled 2014-11-27 (×2): qty 1

## 2014-11-27 MED ORDER — ACETAMINOPHEN 325 MG PO TABS: 650.0000 mg | ORAL_TABLET | ORAL | Status: DC | PRN

## 2014-11-27 MED ORDER — THIAMINE HCL 100 MG/ML IJ SOLN: 100.0000 mg | Freq: Every day | INTRAMUSCULAR | Status: DC

## 2014-11-27 MED ORDER — VITAMIN B-1 100 MG PO TABS
100.0000 mg | ORAL_TABLET | Freq: Every day | ORAL | Status: DC
  Administered 2014-11-28: 100 mg via ORAL
  Filled 2014-11-27: qty 1

## 2014-11-27 NOTE — BHH Counselor (Signed)
TTS gained collateral info from pt's nurse, Danelle Earthly RN.   Assessment to begin shortly.   Ramond Dial, [facility] Triage Specialist
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
## 2014-11-27 NOTE — ED Notes (Signed)
Patient reports that he wants to call his wife and leave as he has a migraine and feels Spencer Mendoza will give him the medications he desires. Encouragement offered. Made TTS aware of patient complaint. TTS in to see patient.

## 2014-11-27 NOTE — ED Notes (Signed)
Pt's family took all of pt's belongings to the car - no belongings left in the room for staff to inventory

## 2014-11-27 NOTE — ED Notes (Signed)
Family took belongings home

## 2014-11-27 NOTE — ED Provider Notes (Signed)
CSN: [id number]     Arrival date & time 11/27/14  2027 History   First MD Initiated Contact with Patient 11/27/14 2127     Chief Complaint  Patient presents with  . Drug / Alcohol Assessment   (Consider location/radiation/quality/duration/timing/severity/associated sxs/prior Treatment) HPI Spencer Mendoza is a 54 yo male presenting with request for detox from ETOH.  He reports he has been drinking 1/5 of liquor and 3-4 16 oz flavored alcohol drinks a day x several years.  He reports he will sometimes blackout and recently he became aggressive with his wife and he is ready to quit drinking to ensure that won't happen again.  He endorses some daily marijuana use and he smokes about 7 cigarettes a day.  He denies any other drug use.  He does report a seizure a week ago when he tried to stop drinking at home.  He endorses a headache but he states it is not unlike the headaches he has regularly.  He denies any SI or HI, fevers, chills, nausea, vomiting, abd pain or focal deficit.      Past Medical History  Diagnosis Date  . Epilepsy   . Migraine    Past Surgical History  Procedure Laterality Date  . Knee arthroscopy Right   . Appendectomy     Family History  Problem Relation Age of Onset  . Heart disease Mother   . Stroke Mother   . Heart disease Father   . Hypertension Father    History  Substance Use Topics  . Smoking status: Current Every Day Smoker -- 1.00 packs/day for 35 years  . Smokeless tobacco: Not on file  . Alcohol Use: Yes     Comment: Drinks 4-5 beers daily    Review of Systems  Constitutional: Negative for fever and chills.  HENT: Negative for sore throat.   Eyes: Negative for visual disturbance.  Respiratory: Negative for cough and shortness of breath.   Cardiovascular: Negative for chest pain and leg swelling.  Gastrointestinal: Negative for nausea, vomiting and diarrhea.  Genitourinary: Negative for dysuria.  Musculoskeletal: Negative for myalgias.  Skin:  Negative for rash.  Neurological: Positive for seizures and headaches. Negative for weakness and numbness.  Psychiatric/Behavioral: Negative for suicidal ideas.    Allergies  Codeine  Home Medications   Prior to Admission medications   Medication Sig Start Date End Date Taking? Authorizing Provider  amitriptyline (ELAVIL) 25 MG tablet Take 1 tablet (25 mg total) by mouth at bedtime. 10/25/14  Yes Cameron Sprang, MD  Aspirin-Salicylamide-Caffeine [facility] HEADACHE POWDER PO) Take 1 packet by mouth every 3 (three) hours as needed (for migraine headaches.).   Yes Historical Provider, MD  cetirizine (ZYRTEC) 10 MG tablet Take 10 mg by mouth daily.   Yes Historical Provider, MD  venlafaxine (EFFEXOR) 37.5 MG tablet Take 2 tablets twice daily Patient taking differently: Take 75 mg by mouth 2 (two) times daily.  11/20/14  Yes Cameron Sprang, MD   BP 145/98 mmHg  Pulse 94  Temp(Src) 97.9 F (36.6 C) (Oral)  Resp 19  SpO2 98% Physical Exam  Constitutional: He is oriented to person, place, and time. He appears well-developed and well-nourished. No distress.  HENT:  Head: Normocephalic and atraumatic.  Mouth/Throat: Oropharynx is clear and moist. Mucous membranes are dry. No oropharyngeal exudate.  Eyes: Conjunctivae are normal.  Neck: Neck supple. No thyromegaly present.  Cardiovascular: Normal rate, regular rhythm and intact distal pulses.   Pulmonary/Chest: Effort normal and breath sounds normal.  No respiratory distress. He has no wheezes. He has no rales. He exhibits no tenderness.  Abdominal: Soft. There is no tenderness.  Musculoskeletal: He exhibits no tenderness.  Lymphadenopathy:    He has no cervical adenopathy.  Neurological: He is alert and oriented to person, place, and time. No cranial nerve deficit.  Skin: Skin is warm and dry. No rash noted. He is not diaphoretic.  Psychiatric: He has a normal mood and affect.  Nursing note and vitals reviewed.   ED Course  Procedures  (including critical care time) Labs Review Labs Reviewed  ACETAMINOPHEN LEVEL - Abnormal; Notable for the following:    Acetaminophen (Tylenol), Serum <10.0 (*)    All other components within normal limits  COMPREHENSIVE METABOLIC PANEL - Abnormal; Notable for the following:    Glucose, Bld 105 (*)    All other components within normal limits  ETHANOL - Abnormal; Notable for the following:    Alcohol, Ethyl (B) 276 (*)    All other components within normal limits  URINE RAPID DRUG SCREEN (HOSP PERFORMED) - Abnormal; Notable for the following:    Tetrahydrocannabinol POSITIVE (*)    All other components within normal limits  CBC  SALICYLATE LEVEL    Imaging Review No results found.   EKG Interpretation None      MDM   Final diagnoses:  Alcohol abuse   54 yo presenting for ETOH detox.  He has been medically cleared in the ED and is awaiting consult by TTS team for possible placement. He is currently not having SI or HI and appears stable in NAD. Pt is cleared to be moved back to [facility].   Of note, there is a history of epilepsy that has been evaluated regularly by neurology. He has tapered off Topamax and Depakote due to side effects. He denies any seizures except when he misses a dose of Effexor. The neurology note reports 24-hour EEG was normal, despite his reports of having "petits" during the test with no EEG changes seen throughout the study.  The seizure he had last week was reportedly after he tried to stop drinking on his own.    Filed Vitals:   11/27/14 2038  BP: 145/98  Pulse: 94  Temp: 97.9 F (36.6 C)  TempSrc: Oral  Resp: 19  SpO2: 98%   Meds given in ED:  Medications  LORazepam (ATIVAN) tablet 0-4 mg (2 mg Oral Given 11/27/14 2219)  thiamine (VITAMIN B-1) tablet 100 mg (not administered)    Or  thiamine (B-1) injection 100 mg (not administered)  acetaminophen (TYLENOL) tablet 650 mg (not administered)  ibuprofen (ADVIL,MOTRIN) tablet 600 mg (not  administered)  ondansetron (ZOFRAN) tablet 4 mg (not administered)  nicotine (NICODERM CQ - dosed in mg/24 hours) patch 21 mg (not administered)  amitriptyline (ELAVIL) tablet 25 mg (25 mg Oral Given 11/27/14 2219)  venlafaxine (EFFEXOR) tablet 75 mg (75 mg Oral Given 11/27/14 2220)    New Prescriptions   No medications on file         Britt Bottom, NP 11/28/14 0041  Malvin Johns, MD 11/28/14 [phone]

## 2014-11-27 NOTE — ED Notes (Signed)
Pt requesting detox from alcohol.  Has tried to detox at home.  Pt reports drinking what ever he can get his hands on.  Pt states he would take a drink when he wakes up in the middle of the night to urinate.  Pt is emotional in triage, calm and cooperative at this time.    Pt's wife reports hx of migraine h/a's and seizure DO.  She reports pt has had "hemiplegia migraine" in the past.  She reports his father had a brain tumor.

## 2014-11-28 ENCOUNTER — Encounter (HOSPITAL_COMMUNITY): Payer: Self-pay | Admitting: *Deleted

## 2014-11-28 ENCOUNTER — Inpatient Hospital Stay (HOSPITAL_COMMUNITY)
Admission: AD | Admit: 2014-11-28 | Discharge: 2014-12-01 | DRG: 897 | Disposition: A | Discharge status: Home / Self Care | Payer: Federal, State, Local not specified - Other | Source: Intra-hospital | Attending: Emergency Medicine | Admitting: Emergency Medicine

## 2014-11-28 DIAGNOSIS — R519 Headache, unspecified: Secondary | ICD-10-CM

## 2014-11-28 DIAGNOSIS — G43909 Migraine, unspecified, not intractable, without status migrainosus: Secondary | ICD-10-CM | POA: Diagnosis present

## 2014-11-28 DIAGNOSIS — Y908 Blood alcohol level of 240 mg/100 ml or more: Secondary | ICD-10-CM | POA: Diagnosis present

## 2014-11-28 DIAGNOSIS — F1010 Alcohol abuse, uncomplicated: Secondary | ICD-10-CM

## 2014-11-28 DIAGNOSIS — Z79899 Other long term (current) drug therapy: Secondary | ICD-10-CM

## 2014-11-28 DIAGNOSIS — F10239 Alcohol dependence with withdrawal, unspecified: Secondary | ICD-10-CM | POA: Diagnosis present

## 2014-11-28 DIAGNOSIS — F17210 Nicotine dependence, cigarettes, uncomplicated: Secondary | ICD-10-CM | POA: Diagnosis present

## 2014-11-28 DIAGNOSIS — G40909 Epilepsy, unspecified, not intractable, without status epilepticus: Secondary | ICD-10-CM | POA: Diagnosis present

## 2014-11-28 DIAGNOSIS — R51 Headache: Secondary | ICD-10-CM

## 2014-11-28 DIAGNOSIS — F1020 Alcohol dependence, uncomplicated: Secondary | ICD-10-CM | POA: Diagnosis present

## 2014-11-28 DIAGNOSIS — F329 Major depressive disorder, single episode, unspecified: Secondary | ICD-10-CM | POA: Diagnosis present

## 2014-11-28 DIAGNOSIS — I10 Essential (primary) hypertension: Secondary | ICD-10-CM | POA: Diagnosis present

## 2014-11-28 DIAGNOSIS — F10230 Alcohol dependence with withdrawal, uncomplicated: Secondary | ICD-10-CM | POA: Insufficient documentation

## 2014-11-28 DIAGNOSIS — F419 Anxiety disorder, unspecified: Secondary | ICD-10-CM | POA: Diagnosis present

## 2014-11-28 DIAGNOSIS — F10939 Alcohol use, unspecified with withdrawal, unspecified: Secondary | ICD-10-CM | POA: Diagnosis present

## 2014-11-28 DIAGNOSIS — F1994 Other psychoactive substance use, unspecified with psychoactive substance-induced mood disorder: Secondary | ICD-10-CM

## 2014-11-28 HISTORY — DX: Major depressive disorder, single episode, unspecified: F32.9

## 2014-11-28 HISTORY — DX: Anxiety disorder, unspecified: F41.9

## 2014-11-28 HISTORY — DX: Depression, unspecified: F32.A

## 2014-11-28 MED ORDER — CARBAMAZEPINE 200 MG PO TABS
200.0000 mg | ORAL_TABLET | Freq: Two times a day (BID) | ORAL | Status: DC
  Administered 2014-11-29 – 2014-12-01 (×5): 200 mg via ORAL
  Filled 2014-11-28 (×4): qty 1
  Filled 2014-11-28: qty 28
  Filled 2014-11-28 (×3): qty 1
  Filled 2014-11-28: qty 28

## 2014-11-28 MED ORDER — IBUPROFEN 800 MG PO TABS
800.0000 mg | ORAL_TABLET | Freq: Once | ORAL | Status: AC
Start: 2014-11-28 — End: 2014-11-28
  Administered 2014-11-28: 800 mg via ORAL
  Filled 2014-11-28: qty 1

## 2014-11-28 MED ORDER — HYDROXYZINE HCL 25 MG PO TABS
25.0000 mg | ORAL_TABLET | Freq: Every evening | ORAL | Status: DC | PRN
  Administered 2014-11-29: 25 mg via ORAL
  Filled 2014-11-28: qty 1

## 2014-11-28 MED ORDER — AMITRIPTYLINE HCL 25 MG PO TABS
25.0000 mg | ORAL_TABLET | Freq: Every day | ORAL | Status: DC
  Administered 2014-11-28 – 2014-11-30 (×3): 25 mg via ORAL
  Filled 2014-11-28 (×2): qty 1
  Filled 2014-11-28: qty 14
  Filled 2014-11-28 (×3): qty 1

## 2014-11-28 MED ORDER — CARBAMAZEPINE 200 MG PO TABS
200.0000 mg | ORAL_TABLET | Freq: Two times a day (BID) | ORAL | Status: DC
  Administered 2014-11-28: 200 mg via ORAL
  Filled 2014-11-28 (×2): qty 1

## 2014-11-28 MED ORDER — THIAMINE HCL 100 MG/ML IJ SOLN: 100.0000 mg | Freq: Every day | INTRAMUSCULAR | Status: DC

## 2014-11-28 MED ORDER — MAGNESIUM HYDROXIDE 400 MG/5ML PO SUSP: 30.0000 mL | Freq: Every day | ORAL | Status: DC | PRN

## 2014-11-28 MED ORDER — LORAZEPAM 1 MG PO TABS
0.0000 mg | ORAL_TABLET | Freq: Four times a day (QID) | ORAL | Status: DC
  Administered 2014-11-29: 1 mg via ORAL
  Administered 2014-11-29: 2 mg via ORAL
  Filled 2014-11-28: qty 1
  Filled 2014-11-28: qty 2

## 2014-11-28 MED ORDER — KETOROLAC TROMETHAMINE 10 MG PO TABS
10.0000 mg | ORAL_TABLET | Freq: Four times a day (QID) | ORAL | Status: DC | PRN
  Administered 2014-11-29 – 2014-12-01 (×4): 10 mg via ORAL
  Filled 2014-11-28 (×6): qty 1

## 2014-11-28 MED ORDER — KETOROLAC TROMETHAMINE 15 MG/ML IJ SOLN
15.0000 mg | Freq: Once | INTRAMUSCULAR | Status: AC
  Administered 2014-11-28: 15 mg via INTRAVENOUS
  Filled 2014-11-28 (×2): qty 1

## 2014-11-28 MED ORDER — VENLAFAXINE HCL 75 MG PO TABS
75.0000 mg | ORAL_TABLET | Freq: Two times a day (BID) | ORAL | Status: DC
  Administered 2014-11-28 – 2014-12-01 (×6): 75 mg via ORAL
  Filled 2014-11-28 (×2): qty 1
  Filled 2014-11-28: qty 28
  Filled 2014-11-28: qty 1
  Filled 2014-11-28: qty 28
  Filled 2014-11-28 (×5): qty 1
  Filled 2014-11-28: qty 2

## 2014-11-28 MED ORDER — ACETAMINOPHEN 325 MG PO TABS
650.0000 mg | ORAL_TABLET | Freq: Four times a day (QID) | ORAL | Status: DC | PRN
Start: 2014-11-28 — End: 2014-12-02
  Filled 2014-11-28: qty 2

## 2014-11-28 MED ORDER — ALUM & MAG HYDROXIDE-SIMETH 200-200-20 MG/5ML PO SUSP: 30.0000 mL | ORAL | Status: DC | PRN

## 2014-11-28 MED ORDER — VITAMIN B-1 100 MG PO TABS
100.0000 mg | ORAL_TABLET | Freq: Every day | ORAL | Status: DC
Start: 2014-11-29 — End: 2014-12-02
  Administered 2014-11-29 – 2014-12-01 (×3): 100 mg via ORAL
  Filled 2014-11-28 (×5): qty 1

## 2014-11-28 MED ORDER — NICOTINE 21 MG/24HR TD PT24
21.0000 mg | MEDICATED_PATCH | Freq: Every day | TRANSDERMAL | Status: DC
  Administered 2014-11-29 – 2014-12-01 (×3): 21 mg via TRANSDERMAL
  Filled 2014-11-28 (×5): qty 1

## 2014-11-28 MED ORDER — ONDANSETRON HCL 4 MG PO TABS
4.0000 mg | ORAL_TABLET | Freq: Three times a day (TID) | ORAL | Status: DC | PRN
  Administered 2014-11-29: 4 mg via ORAL
  Filled 2014-11-28: qty 1

## 2014-11-28 MED ORDER — LORAZEPAM 1 MG PO TABS
2.0000 mg | ORAL_TABLET | Freq: Once | ORAL | Status: AC
  Administered 2014-11-28: 2 mg via ORAL
  Filled 2014-11-28: qty 2

## 2014-11-28 NOTE — Progress Notes (Signed)
Patient admitted to [facility], first admission.  Stated he was in [facility] many years ago.  Stated he drinks 1/5th vodka daily and 10 beers daily for the past yr.  Was sober in 2005 for one year.  THC, couple joints daily for the past 3 years.  Denied cocaine and heroin use.  No children, married three times and still married to 3rd wife who brought him to the hospital.  Does not remember where he lat worked, receives disability checks because of migraines.  Severe migraine in January which paralyzed his right side and he has improved.  Goes to [facility] MD's because of migraines.  Surgery for appendicitis in 1981.  No dentures, missing teeth.  Decreasing hearing, no hearing aide.  Has reading glasses with him.  Lives with wife and his father who has cancer.  History of migraines.  Denied abuse.  One pack cigarettes daily for 40 years.  History seizures "epileptic all my life".  Usually has one seizure each year.  Rated anxiety and hopeless 10, denied depression.  Denied SI and HI, contracts for safety.  Denied visual hallucinations.  Stated he "hears stuff".  Dry skin feet/ legs.  Tattoos on bilateral legs, arms, R shoulder, chest and abdomen.  Patient has been cooperative and pleasant.  Oriented to unit.  Given food/drink upon arrival.  No locker needed.  Wife to bring clothes for him. High fall risk because of recent falls at home.

## 2014-11-28 NOTE — Progress Notes (Signed)
Patient ID: Spencer Mendoza, male   DOB: [birth year], 54 y.o.   MRN: [id number] D: Client visiting with wife this evening, reports history of migraines, says at home he take [facility] powder, also discuss home medications. A: Writer introduced self to client, confirmed that he has Effexor and Elavil ordered. Staff will monitor q53min for safety, encouraged group. R: Client is safe on the unit, refused group.

## 2014-11-28 NOTE — Tx Team (Signed)
Initial Interdisciplinary Treatment Plan   PATIENT STRESSORS: Financial difficulties Health problems Substance abuse   PATIENT STRENGTHS: Ability for insight Average or above average intelligence Capable of independent living Communication skills General fund of knowledge Motivation for treatment/growth Supportive family/friends   PROBLEM LIST: Problem List/Patient Goals Date to be addressed Date deferred Reason deferred Estimated date of resolution  "substance abuse" 11/28/2014   D/c        "anxiety" 11/28/2014   D/c      D/c  "depression" 11/28/2014                              DISCHARGE CRITERIA:  Ability to meet basic life and health needs Improved stabilization in mood, thinking, and/or behavior Medical problems require only outpatient monitoring Motivation to continue treatment in a less acute level of care Need for constant or close observation no longer present Reduction of life-threatening or endangering symptoms to within safe limits Safe-care adequate arrangements made Verbal commitment to aftercare and medication compliance Withdrawal symptoms are absent or subacute and managed without 24-hour nursing intervention  PRELIMINARY DISCHARGE PLAN: Attend aftercare/continuing care group Attend PHP/IOP Attend 12-step recovery group Outpatient therapy Return to previous living arrangement  PATIENT/FAMIILY INVOLVEMENT: This treatment plan has been presented to and reviewed with the patient, Spencer Mendoza, who is determined to get treatment for alcohol and substance abuse.  The patient and family have been given the opportunity to ask questions and make suggestions.  Cammy Copa 11/28/2014, 7:17 PM

## 2014-11-28 NOTE — Consult Note (Signed)
[facility] Consult   Reason for Consult:  Alcohol Detox  Referring Physician:  EDP  BRANNEN KOPPEN is an 54 y.o. male. Total Time spent with patient: 45 minutes  Assessment: AXIS I:  Alcohol Abuse and Substance Induced Mood Disorder AXIS II:  Deferred AXIS III:   Past Medical History  Diagnosis Date  . Epilepsy   . Migraine    AXIS IV:  other psychosocial or environmental problems and problems related to social environment AXIS V:  41-50 serious symptoms  Plan:  Recommend psychiatric Inpatient admission when medically cleared.  Subjective:   KEYSHAUN EXLEY is a 54 y.o. male patient admitted with request for alcohol detoxification with history of severe withdrawals. Per Dr. Darleene Cleaver and Waylan Boga, NP, who evaluated the pt this morning, pt must be admitted to [facility] 300 hall for safety and stabilization.  HPI:  WESTEN DININO is a Caucasian, married 54 y.o. male presenting to [facility] requesting alcohol detox. Pt presents with irritable mood and affect reportedly due to current withdrawal symptoms, including nausea, diarrhea, agitation, and headache. Pt reports a history of petit mal seizures, stating that he last had a seizure about one week ago.However, pt is not currently on any anti-seizure medications. He states that he blacked out about 3 weeks ago (related to his drinking) and fell and hit his head on a cabinet. Pt's speech is logical and coherent and thought process is normal. No evidence of delusions and no hx of hallucinations. Pt complains of current migraine and says that he needs more than Tylenol. He is treated for migraines in [place], [place] and says that he is usually given Dilaudid, magnesium, and steroids when he has a migraine such as the one he is currently experiencing. Pt says he is on SSDI benefits due to his migraines. Pt reports increased stress lately due to family problems. Pt acknowledges some symptoms of depression such as insomnia but denies  any Hx of mental health problems or psychiatric hospitalizations. Pt said he has been to substance abuse treatment once before but does not remember the name of the facility. He endorses alcohol use since the age of 3, stating that he drinks up to a fifth in a day. He also endorses marijuana use. Current BAL is 276 and UDS is positive for THC. Pt's EDP states that he is medically cleared since he last had a seizure 1 week ago.   HPI Elements:   Location:  Psychiatric. Quality:  Worsening. Severity:  Severe. Timing:  Constant. Duration:  Chronic. Context:  Exacerbation of underlying alcoholism with safety risk secondary to severity of withdrawals. .  Past Psychiatric History: Past Medical History  Diagnosis Date  . Epilepsy   . Migraine     reports that he has been smoking.  He does not have any smokeless tobacco history on file. He reports that he drinks alcohol. He reports that he uses illicit drugs (Heroin, Cocaine, and Marijuana). Family History  Problem Relation Age of Onset  . Heart disease Mother   . Stroke Mother   . Heart disease Father   . Hypertension Father    Family History Substance Abuse: Yes, Describe: (Alcohol, THC) Family Supports: Yes, List: (Wife, Father) Living Arrangements: Spouse/significant other, Parent Can pt return to current living arrangement?: Yes Abuse/Neglect [facility]) Physical Abuse: Denies Verbal Abuse: Denies Sexual Abuse: Denies Allergies:   Allergies  Allergen Reactions  . Codeine Nausea And Vomiting    ACT Assessment Complete:  Yes:  Educational Status    Risk to Self: Risk to self with the past 6 months Suicidal Ideation: No Suicidal Intent: No Is patient at risk for suicide?: No Suicidal Plan?: No Access to Means: No What has been your use of drugs/alcohol within the last 12 months?: Alcohol and THC abuse, nearly daily for past 40 years Previous Attempts/Gestures: No How many times?: 0 Other Self Harm Risks: substance  abuse Triggers for Past Attempts: Unknown Intentional Self Injurious Behavior: None Family Suicide History: No Recent stressful life event(s): Conflict (Comment) (Family conflict) Persecutory voices/beliefs?: No Depression: Yes Depression Symptoms: Insomnia, Isolating, Feeling angry/irritable Substance abuse history and/or treatment for substance abuse?: Yes Suicide prevention information given to non-admitted patients: Not applicable  Risk to Others: Risk to Others within the past 6 months Homicidal Ideation: No Thoughts of Harm to Others: No Current Homicidal Intent: No Current Homicidal Plan: No Access to Homicidal Means: No Identified Victim: na History of harm to others?: No Assessment of Violence: None Noted Violent Behavior Description: na Does patient have access to weapons?: No Criminal Charges Pending?: No Does patient have a court date: No  Abuse: Abuse/Neglect Assessment (Assessment to be complete while patient is alone) Physical Abuse: Denies Verbal Abuse: Denies Sexual Abuse: Denies Exploitation of patient/patient's resources: Denies Self-Neglect: Denies  Prior Inpatient Therapy: Prior Inpatient Therapy Prior Inpatient Therapy: Yes Prior Therapy Dates: Unknown Prior Therapy Facilty/Provider(s): Unknown ([facility] facility), pt cannot remember Reason for Treatment: Alcohol abuse  Prior Outpatient Therapy: Prior Outpatient Therapy Prior Outpatient Therapy: No Prior Therapy Dates: na Prior Therapy Facilty/Provider(s): na Reason for Treatment: na  Additional Information: Additional Information 1:1 In Past 12 Months?: No CIRT Risk: No Elopement Risk: No Does patient have medical clearance?: Yes                  Objective: Blood pressure 143/95, pulse 84, temperature 98.7 F (37.1 C), temperature source Oral, resp. rate 20, SpO2 98 %.There is no weight on file to calculate BMI. Results for orders placed or performed during the hospital encounter of  11/27/14 (from the past 72 hour(s))  Acetaminophen level     Status: Abnormal   Collection Time: 11/27/14  8:50 PM  Result Value Ref Range   Acetaminophen (Tylenol), Serum <10.0 (L) 10 - 30 ug/mL    Comment:        THERAPEUTIC CONCENTRATIONS VARY SIGNIFICANTLY. A RANGE OF 10-30 ug/mL MAY BE AN EFFECTIVE CONCENTRATION FOR MANY PATIENTS. HOWEVER, SOME ARE BEST TREATED AT CONCENTRATIONS OUTSIDE THIS RANGE. ACETAMINOPHEN CONCENTRATIONS >150 ug/mL AT 4 HOURS AFTER INGESTION AND >50 ug/mL AT 12 HOURS AFTER INGESTION ARE OFTEN ASSOCIATED WITH TOXIC REACTIONS.   CBC     Status: None   Collection Time: 11/27/14  8:50 PM  Result Value Ref Range   WBC 7.0 4.0 - 10.5 K/uL   RBC 5.06 4.22 - 5.81 MIL/uL   Hemoglobin 16.1 13.0 - 17.0 g/dL   HCT 47.3 39.0 - 52.0 %   MCV 93.5 78.0 - 100.0 fL   MCH 31.8 26.0 - 34.0 pg   MCHC 34.0 30.0 - 36.0 g/dL   RDW 15.0 11.5 - 15.5 %   Platelets 277 150 - 400 K/uL  Comprehensive metabolic panel     Status: Abnormal   Collection Time: 11/27/14  8:50 PM  Result Value Ref Range   Sodium 135 135 - 145 mmol/L    Comment: Please note change in reference range.   Potassium 3.8 3.5 - 5.1 mmol/L  Comment: Please note change in reference range.   Chloride 96 96 - 112 mEq/L   CO2 27 19 - 32 mmol/L   Glucose, Bld 105 (H) 70 - 99 mg/dL   BUN 8 6 - 23 mg/dL   Creatinine, Ser 0.98 0.50 - 1.35 mg/dL   Calcium 9.8 8.4 - 10.5 mg/dL   Total Protein 8.2 6.0 - 8.3 g/dL   Albumin 4.9 3.5 - 5.2 g/dL   AST 36 0 - 37 U/L   ALT 23 0 - 53 U/L   Alkaline Phosphatase 84 39 - 117 U/L   Total Bilirubin 0.4 0.3 - 1.2 mg/dL   GFR calc non Af Amer >90 >90 mL/min   GFR calc Af Amer >90 >90 mL/min    Comment: (NOTE) The eGFR has been calculated using the CKD EPI equation. This calculation has not been validated in all clinical situations. eGFR's persistently <90 mL/min signify possible Chronic Kidney Disease.    Anion gap 12 5 - 15  Ethanol (ETOH)     Status: Abnormal    Collection Time: 11/27/14  8:50 PM  Result Value Ref Range   Alcohol, Ethyl (B) 276 (H) 0 - 9 mg/dL    Comment:        LOWEST DETECTABLE LIMIT FOR SERUM ALCOHOL IS 11 mg/dL FOR MEDICAL PURPOSES ONLY   Salicylate level     Status: None   Collection Time: 11/27/14  8:50 PM  Result Value Ref Range   Salicylate Lvl <6.5 2.8 - 20.0 mg/dL  Urine Drug Screen     Status: Abnormal   Collection Time: 11/27/14  9:07 PM  Result Value Ref Range   Opiates NONE DETECTED NONE DETECTED   Cocaine NONE DETECTED NONE DETECTED   Benzodiazepines NONE DETECTED NONE DETECTED   Amphetamines NONE DETECTED NONE DETECTED   Tetrahydrocannabinol POSITIVE (A) NONE DETECTED   Barbiturates NONE DETECTED NONE DETECTED    Comment:        DRUG SCREEN FOR MEDICAL PURPOSES ONLY.  IF CONFIRMATION IS NEEDED FOR ANY PURPOSE, NOTIFY LAB WITHIN 5 DAYS.        LOWEST DETECTABLE LIMITS FOR URINE DRUG SCREEN Drug Class       Cutoff (ng/mL) Amphetamine      1000 Barbiturate      200 Benzodiazepine   993 Tricyclics       570 Opiates          300 Cocaine          300 THC              50    Labs are reviewed and are pertinent for UDS + for THC.   Current Facility-Administered Medications  Medication Dose Route Frequency Provider Last Rate Last Dose  . amitriptyline (ELAVIL) tablet 25 mg  25 mg Oral QHS Britt Bottom, NP   25 mg at 11/27/14 2219  . carbamazepine (TEGRETOL) tablet 200 mg  200 mg Oral BID PC Ahsha Hinsley      . LORazepam (ATIVAN) tablet 0-4 mg  0-4 mg Oral 4 times per day Britt Bottom, NP   1 mg at 11/28/14 1133  . nicotine (NICODERM CQ - dosed in mg/24 hours) patch 21 mg  21 mg Transdermal Daily Britt Bottom, NP   21 mg at 11/28/14 0948  . ondansetron (ZOFRAN) tablet 4 mg  4 mg Oral Q8H PRN Britt Bottom, NP   4 mg at 11/28/14 1134  . thiamine (VITAMIN B-1) tablet 100 mg  100 mg  Oral Daily Britt Bottom, NP   100 mg at 11/28/14 9509   Or  . thiamine (B-1) injection 100 mg   100 mg Intravenous Daily Britt Bottom, NP      . venlafaxine [facility]) tablet 75 mg  75 mg Oral BID Riann Oman   75 mg at 11/28/14 3267   Current Outpatient Prescriptions  Medication Sig Dispense Refill  . amitriptyline (ELAVIL) 25 MG tablet Take 1 tablet (25 mg total) by mouth at bedtime. 30 tablet 3  . Aspirin-Salicylamide-Caffeine (BC HEADACHE POWDER PO) Take 1 packet by mouth every 3 (three) hours as needed (for migraine headaches.).    [name] cetirizine (ZYRTEC) 10 MG tablet Take 10 mg by mouth daily.    [name] venlafaxine (EFFEXOR) 37.5 MG tablet Take 2 tablets twice daily (Patient taking differently: Take 75 mg by mouth 2 (two) times daily. ) 120 tablet 2    Psychiatric Specialty Exam:     Blood pressure 143/95, pulse 84, temperature 98.7 F (37.1 C), temperature source Oral, resp. rate 20, SpO2 98 %.There is no weight on file to calculate BMI.  General Appearance: Casual and Fairly Groomed  Engineer, water::  Good  Speech:  Clear and Coherent and Normal Rate  Volume:  Normal  Mood:  Depressed  Affect:  Depressed  Thought Process:  Coherent and Goal Directed  Orientation:  Full (Time, Place, and Person)  Thought Content:  WDL  Suicidal Thoughts:  No  Homicidal Thoughts:  No  Memory:  Immediate;   Fair Recent;   Fair Remote;   Fair  Judgement:  Fair  Insight:  Fair  Psychomotor Activity:  Normal  Concentration:  Good  Recall:  [place] of Knowledge:Good  Language: Good  Akathisia:  No  Handed:    AIMS (if indicated):     Assets:  Desire for Improvement Resilience  Sleep:      Musculoskeletal: Strength & Muscle Tone: within normal limits Gait & Station: normal Patient leans: N/A  Treatment Plan Summary: Daily contact with patient to assess and evaluate symptoms and progress in treatment  -Admit to [facility] when bed available for safety and stabilization.  Benjamine Mola, FNP-BC 11/28/2014 3:11 PM  Patient seen, evaluated and I agree with notes by Nurse  Practitioner. Corena Pilgrim, MD

## 2014-11-28 NOTE — BHH Counselor (Signed)
Per Patriciaann Clan, PA, pt meets criteria for inpt treatment. TTS to seek placement.  Pt also reports Hx of seizure disorder. Most recent seizure 1 week ago. Not on any anti-seizure meds. Internal medicine consult will be initiated.   Ramond Dial, [facility] Triage Specialist

## 2014-11-29 ENCOUNTER — Encounter (HOSPITAL_COMMUNITY): Payer: Self-pay | Admitting: Psychiatry

## 2014-11-29 DIAGNOSIS — F1994 Other psychoactive substance use, unspecified with psychoactive substance-induced mood disorder: Secondary | ICD-10-CM

## 2014-11-29 DIAGNOSIS — G43909 Migraine, unspecified, not intractable, without status migrainosus: Secondary | ICD-10-CM

## 2014-11-29 DIAGNOSIS — F1010 Alcohol abuse, uncomplicated: Secondary | ICD-10-CM

## 2014-11-29 DIAGNOSIS — F1290 Cannabis use, unspecified, uncomplicated: Secondary | ICD-10-CM

## 2014-11-29 DIAGNOSIS — F1020 Alcohol dependence, uncomplicated: Secondary | ICD-10-CM | POA: Diagnosis present

## 2014-11-29 MED ORDER — LOPERAMIDE HCL 2 MG PO CAPS: 2.0000 mg | ORAL_CAPSULE | ORAL | Status: DC | PRN

## 2014-11-29 MED ORDER — CHLORDIAZEPOXIDE HCL 25 MG PO CAPS
25.0000 mg | ORAL_CAPSULE | Freq: Three times a day (TID) | ORAL | Status: DC
  Administered 2014-12-01 (×2): 25 mg via ORAL
  Filled 2014-11-29: qty 3
  Filled 2014-11-29 (×2): qty 1

## 2014-11-29 MED ORDER — ASPIRIN EC 81 MG PO TBEC
81.0000 mg | DELAYED_RELEASE_TABLET | Freq: Every day | ORAL | Status: DC
  Administered 2014-11-30 – 2014-12-01 (×2): 81 mg via ORAL
  Filled 2014-11-29 (×4): qty 1

## 2014-11-29 MED ORDER — ATENOLOL 50 MG PO TABS
50.0000 mg | ORAL_TABLET | Freq: Two times a day (BID) | ORAL | Status: DC
Start: 2014-11-29 — End: 2014-12-02
  Administered 2014-11-29 – 2014-12-01 (×4): 50 mg via ORAL
  Filled 2014-11-29: qty 28
  Filled 2014-11-29 (×2): qty 1
  Filled 2014-11-29: qty 2
  Filled 2014-11-29: qty 28
  Filled 2014-11-29 (×4): qty 1

## 2014-11-29 MED ORDER — CHLORDIAZEPOXIDE HCL 25 MG PO CAPS
25.0000 mg | ORAL_CAPSULE | Freq: Once | ORAL | Status: AC
  Administered 2014-11-29: 25 mg via ORAL
  Filled 2014-11-29: qty 1

## 2014-11-29 MED ORDER — CHLORDIAZEPOXIDE HCL 25 MG PO CAPS
25.0000 mg | ORAL_CAPSULE | ORAL | Status: DC
  Filled 2014-11-29: qty 2

## 2014-11-29 MED ORDER — HYDROXYZINE HCL 25 MG PO TABS
25.0000 mg | ORAL_TABLET | Freq: Four times a day (QID) | ORAL | Status: DC | PRN
Start: 2014-11-29 — End: 2014-12-02
  Administered 2014-11-29 – 2014-11-30 (×3): 25 mg via ORAL
  Filled 2014-11-29: qty 1
  Filled 2014-11-29: qty 10
  Filled 2014-11-29 (×2): qty 1

## 2014-11-29 MED ORDER — LORAZEPAM 1 MG PO TABS
ORAL_TABLET | ORAL | Status: AC
  Administered 2014-11-29: 1 mg via ORAL
  Filled 2014-11-29: qty 1

## 2014-11-29 MED ORDER — LORAZEPAM 1 MG PO TABS
1.0000 mg | ORAL_TABLET | Freq: Four times a day (QID) | ORAL | Status: DC
  Administered 2014-11-29: 1 mg via ORAL

## 2014-11-29 MED ORDER — METHYLPREDNISOLONE SODIUM SUCC 125 MG IJ SOLR
80.0000 mg | Freq: Once | INTRAMUSCULAR | Status: AC
  Administered 2014-11-29: 80 mg via INTRAMUSCULAR
  Filled 2014-11-29: qty 1.28

## 2014-11-29 MED ORDER — LORAZEPAM 1 MG PO TABS: 1.0000 mg | ORAL_TABLET | Freq: Four times a day (QID) | ORAL | Status: DC | PRN

## 2014-11-29 MED ORDER — CHLORDIAZEPOXIDE HCL 25 MG PO CAPS
25.0000 mg | ORAL_CAPSULE | Freq: Every day | ORAL | Status: DC
  Filled 2014-11-29: qty 1

## 2014-11-29 MED ORDER — LORAZEPAM 1 MG PO TABS: 1.0000 mg | ORAL_TABLET | Freq: Every day | ORAL | Status: DC

## 2014-11-29 MED ORDER — CHLORDIAZEPOXIDE HCL 25 MG PO CAPS
25.0000 mg | ORAL_CAPSULE | Freq: Four times a day (QID) | ORAL | Status: AC
  Administered 2014-11-29 – 2014-11-30 (×5): 25 mg via ORAL
  Filled 2014-11-29 (×5): qty 1

## 2014-11-29 MED ORDER — HYDROMORPHONE HCL 2 MG/ML IJ SOLN
2.0000 mg | Freq: Once | INTRAMUSCULAR | Status: AC
  Administered 2014-11-29: 2 mg via INTRAMUSCULAR
  Filled 2014-11-29: qty 1

## 2014-11-29 MED ORDER — LORAZEPAM 1 MG PO TABS: 1.0000 mg | ORAL_TABLET | Freq: Two times a day (BID) | ORAL | Status: DC

## 2014-11-29 MED ORDER — CHLORDIAZEPOXIDE HCL 25 MG PO CAPS
25.0000 mg | ORAL_CAPSULE | Freq: Four times a day (QID) | ORAL | Status: DC | PRN
  Administered 2014-12-01: 25 mg via ORAL
  Filled 2014-11-29: qty 1

## 2014-11-29 MED ORDER — ADULT MULTIVITAMIN W/MINERALS CH
1.0000 | ORAL_TABLET | Freq: Every day | ORAL | Status: DC
  Administered 2014-11-29 – 2014-12-01 (×3): 1 via ORAL
  Filled 2014-11-29 (×6): qty 1

## 2014-11-29 MED ORDER — MAGNESIUM SULFATE 50 % IJ SOLN
1.0000 g | Freq: Once | INTRAMUSCULAR | Status: AC
  Administered 2014-11-29: 1 g via INTRAMUSCULAR
  Filled 2014-11-29: qty 2

## 2014-11-29 MED ORDER — LORAZEPAM 1 MG PO TABS
2.0000 mg | ORAL_TABLET | Freq: Once | ORAL | Status: AC
  Administered 2014-11-29: 2 mg via ORAL
  Filled 2014-11-29: qty 2

## 2014-11-29 MED ORDER — LORAZEPAM 1 MG PO TABS: 1.0000 mg | ORAL_TABLET | Freq: Three times a day (TID) | ORAL | Status: DC

## 2014-11-29 NOTE — Progress Notes (Addendum)
Pt did not attended group.

## 2014-11-29 NOTE — B[facility] Group Notes (Signed)
[facility] LCSW Group Therapy  Emotional Regulation 1:15 - 2: 30 PM        11/29/2014  2:50 PM    Type of Therapy:  Group Therapy  Participation Level: Patient reported not feeling well - did not attend group.   Concha Pyo 11/29/2014 2:50 PM

## 2014-11-29 NOTE — Progress Notes (Signed)
D: Pt denies SI/HI/AVH. Pt is pleasant and cooperative. Pt continues to complain of HA. PA on duty informed of pt complaints.   A: Pt was offered support and encouragement. Pt was given scheduled medications. Pt was encourage to attend groups. Q 15 minute checks were done for safety.   R:Pt attends groups and interacts well with peers and staff. Pt is taking medication. Pt receptive to treatment and safety maintained on unit.

## 2014-11-29 NOTE — Progress Notes (Signed)
ANTICOAGULATION CONSULT NOTE - Follow Up Consult  Pharmacy Consult for Headache   Allergies  Allergen Reactions  . Codeine Nausea And Vomiting    Patient Measurements: Height: 6' 0.5" (184.2 cm) Weight: 164 lb (74.39 kg) IBW/kg (Calculated) : 78.75    Assessment: Reviewed chart anywhere as patient stated that steroid, dilaudid and magnesium have been used with success at [facility] for Headache. MD requested if we had those options available and we reviewed chart and options.  Goal of Therapy:  Relieve headache  Plan:  Discussed with MD and Solu-medrol 80 mg IM, Mag 1 gm IM, and Dilaudid 2 mg IM ordered x1 for headache as documentation stated that these have been successful IV in the past.    Lenox Ponds 11/29/2014,3:28 PM
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
## 2014-11-29 NOTE — [facility] Suicide Risk Assessment (Signed)
Suicide Risk Assessment  Admission Assessment     Nursing information obtained from:  Patient Demographic factors:  Male, Caucasian, Low socioeconomic status, Unemployed Current Mental Status:    Loss Factors:  Financial problems / change in socioeconomic status Historical Factors:  Family history of mental illness or substance abuse, Impulsivity Risk Reduction Factors:  Living with another person, especially a relative Total Time spent with patient: 45 minutes  CLINICAL FACTORS:   Alcohol/Substance Abuse/Dependencies  Psychiatric Specialty Exam:     Blood pressure 127/99, pulse 93, temperature 98.5 F (36.9 C), temperature source Oral, resp. rate 18, height 6' 0.5" (1.842 m), weight 74.39 kg (164 lb), SpO2 98 %.Body mass index is 21.92 kg/(m^2).  General Appearance: Disheveled  Eye Sport and exercise psychologist::  Fair  Speech:  Clear and Coherent, Slow and not spontaneous  Volume:  Decreased  Mood:  Anxious, Depressed and worried, in pain  Affect:  anxious depressed in pain ( headache)  Thought Process:  Coherent and Goal Directed  Orientation:  Full (Time, Place, and Person)  Thought Content:  events, symptoms worries concerns persistent headache  Suicidal Thoughts:  No  Homicidal Thoughts:  No  Memory:  Immediate;   Fair Recent;   Fair Remote;   Fair  Judgement:  Fair  Insight:  Present  Psychomotor Activity:  Restlessness  Concentration:  Fair  Recall:  [employer] [place]  Language: Fair  Akathisia:  No  Handed:  Right  AIMS (if indicated):     Assets:  Desire for Improvement Housing Social Support  Sleep:  Number of Hours: 6.75   Musculoskeletal: Strength & Muscle Tone: within normal limits Gait & Station: normal Patient leans: N/A  COGNITIVE FEATURES THAT CONTRIBUTE TO RISK:  Closed-mindedness Polarized thinking Thought constriction (tunnel vision)    SUICIDE RISK:   Mild:   PLAN OF CARE: Supportive approach/coping skills/relapse prevention            Alcohol Dependence, withdrawal; change the Ativan to Librium for detox ( longer coverage, and his liver enzymes are WNL)                               Will assess for medications for alcohol cravings                               Migraine headache: will institute the protocol that he states worked for him                                  and is recorded in the [facility] record: Dilaudid, Magnesium, Steroids                                 I certify that inpatient services furnished can reasonably be expected to improve the patient's condition.  Spencer Mendoza A 11/29/2014, 4:19 PM
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
## 2014-11-29 NOTE — [facility] Group Notes (Signed)
[facility] LCSW Aftercare Discharge Planning Group Note   11/29/2014 10:09 AM  Participation Quality:  Patient did not attend group.   Treveon Bourcier, Eulas Post

## 2014-11-29 NOTE — Plan of Care (Signed)
Problem: Consults Goal: Suicide Risk Patient Education (See Patient Education module for education specifics)  Outcome: Progressing Pt denied any suicidal ideation on his self-inventory.  He denies any plan here in the hospital and does contract verbally to come to staff before acting on any self-harm thoughts.
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
## 2014-11-30 ENCOUNTER — Encounter (HOSPITAL_COMMUNITY): Payer: Self-pay

## 2014-11-30 ENCOUNTER — Encounter (HOSPITAL_COMMUNITY): Payer: Self-pay | Admitting: *Deleted

## 2014-11-30 ENCOUNTER — Emergency Department (HOSPITAL_COMMUNITY)
Admission: EM | Admit: 2014-11-30 | Discharge: 2014-11-30 | Disposition: A | Discharge status: Home / Self Care | Payer: MEDICAID

## 2014-11-30 DIAGNOSIS — F411 Generalized anxiety disorder: Secondary | ICD-10-CM

## 2014-11-30 MED ORDER — HYDROMORPHONE HCL 1 MG/ML IJ SOLN
1.0000 mg | Freq: Once | INTRAMUSCULAR | Status: AC
  Administered 2014-11-30: 1 mg via INTRAVENOUS
  Filled 2014-11-30: qty 1

## 2014-11-30 MED ORDER — SODIUM CHLORIDE 0.9 % IV BOLUS (SEPSIS)
1000.0000 mL | Freq: Once | INTRAVENOUS | Status: AC
  Administered 2014-11-30: 1000 mL via INTRAVENOUS

## 2014-11-30 MED ORDER — DIPHENHYDRAMINE HCL 50 MG/ML IJ SOLN
25.0000 mg | Freq: Once | INTRAMUSCULAR | Status: AC
  Administered 2014-11-30: 25 mg via INTRAVENOUS
  Filled 2014-11-30: qty 1

## 2014-11-30 MED ORDER — ONDANSETRON HCL 4 MG/2ML IJ SOLN
4.0000 mg | Freq: Once | INTRAMUSCULAR | Status: AC
  Administered 2014-11-30: 4 mg via INTRAVENOUS
  Filled 2014-11-30: qty 2

## 2014-11-30 MED ORDER — HYDROCODONE-ACETAMINOPHEN 5-325 MG PO TABS: 1.0000 | ORAL_TABLET | Freq: Four times a day (QID) | ORAL | Status: DC | PRN

## 2014-11-30 MED ORDER — DEXAMETHASONE SODIUM PHOSPHATE 10 MG/ML IJ SOLN
10.0000 mg | Freq: Once | INTRAMUSCULAR | Status: AC
  Administered 2014-11-30: 10 mg via INTRAVENOUS
  Filled 2014-11-30: qty 1

## 2014-11-30 MED ORDER — HYDROMORPHONE HCL 1 MG/ML IJ SOLN
1.0000 mg | Freq: Once | INTRAMUSCULAR | Status: AC
Start: 2014-11-30 — End: 2014-11-30
  Administered 2014-11-30: 1 mg via INTRAVENOUS
  Filled 2014-11-30: qty 1

## 2014-11-30 MED ORDER — MAGNESIUM SULFATE 2 GM/50ML IV SOLN
2.0000 g | INTRAVENOUS | Status: AC
  Administered 2014-11-30: 2 g via INTRAVENOUS
  Filled 2014-11-30: qty 50

## 2014-11-30 NOTE — Plan of Care (Signed)
Problem: Alteration in mood & ability to function due to Goal: LTG-Pt reports reduction in suicidal thoughts (Patient reports reduction in suicidal thoughts and is able to verbalize a safety plan for whenever patient is feeling suicidal)  Outcome: Progressing Pt denies SI/HI/AVH at this time

## 2014-11-30 NOTE — ED Notes (Signed)
Pt transferred from [facility] with reported  Migraine.  Treated at [facility] with IM medication with no relief.  Sts he normally has to get the "IV stuff like phenergan".  Reported photophobia, light sensitivity, and nausea.  Pain 10/10.

## 2014-11-30 NOTE — Progress Notes (Signed)
[facility] MD Progress Note  11/30/2014 3:48 PM Spencer Mendoza  MRN:  [id number] Subjective:  Nathaneal states that his headache is not any better. States his whole head is throbbing. ( Holds his head while talking) "please help me" The medications given yesterday help some and sent him to sleep. When he woke up he still had the headaches. States that all the medications they have tired before have not been effective. The Imitrex caused severe side effects, and the Zomig he was not able to afford. States that the only medications that help him is the combo given to him at [facility] : IV Dilaudid/steroids/Magnesium/Benadryl Diagnosis:   DSM5: Substance/Addictive Disorders:  Alcohol Related Disorder - Severe (303.90) Depressive Disorders:  Major Depressive Disorder - Moderate (296.22) Total Time spent with patient: 30 minutes  Axis I: Generalized Anxiety Disorder and Substance Induced Mood Disorder  ADL's:  Intact  Sleep: Poor  Appetite:  Poor Psychiatric Specialty Exam: Physical Exam  Review of Systems  Constitutional: Negative.   Eyes: Positive for blurred vision.  Respiratory: Negative.   Cardiovascular: Negative.   Gastrointestinal: Negative.   Genitourinary: Negative.   Musculoskeletal: Negative.   Skin: Negative.   Neurological: Positive for dizziness and headaches.  Endo/Heme/Allergies: Negative.   Psychiatric/Behavioral: Positive for depression and substance abuse. The patient is nervous/anxious and has insomnia.     Blood pressure 133/95, pulse 53, temperature 97.5 F (36.4 C), temperature source Oral, resp. rate 16, height 6' 0.5" (1.842 m), weight 74.39 kg (164 lb), SpO2 94 %.Body mass index is 21.92 kg/(m^2).  General Appearance: Disheveled  Eye Contact::  Minimal  Speech:  Clear and Coherent  Volume:  Decreased  Mood:  Anxious, Depressed and irritable in pain  Affect:  in pain  Thought Process:  Coherent and Goal Directed  Orientation:  Full (Time, Place, and Person)   Thought Content:  somatically focused  Suicidal Thoughts:  No  Homicidal Thoughts:  No  Memory:  Immediate;   Fair Recent;   Fair Remote;   Fair  Judgement:  Fair  Insight:  Present and Shallow  Psychomotor Activity:  Restlessness  Concentration:  Fair  Recall:  [employer] of Knowledge:Fair  Language: Fair  Akathisia:  No  Handed:  Right  AIMS (if indicated):     Assets:  Desire for Improvement  Sleep:  Number of Hours: 6.75   Musculoskeletal: Strength & Muscle Tone: within normal limits Gait & Station: normal Patient leans: N/A  Current Medications: Current Facility-Administered Medications  Medication Dose Route Frequency Provider Last Rate Last Dose  . acetaminophen (TYLENOL) tablet 650 mg  650 mg Oral Q6H PRN Benjamine Mola, FNP   650 mg at 11/28/14 2036  . alum & mag hydroxide-simeth (MAALOX/MYLANTA) 200-200-20 MG/5ML suspension 30 mL  30 mL Oral Q4H PRN Benjamine Mola, FNP      . amitriptyline (ELAVIL) tablet 25 mg  25 mg Oral QHS Benjamine Mola, FNP   25 mg at 11/29/14 2114  . aspirin EC tablet 81 mg  81 mg Oral Daily Laverle Hobby, PA-C   81 mg at 11/30/14 1497  . atenolol (TENORMIN) tablet 50 mg  50 mg Oral BID Laverle Hobby, PA-C   50 mg at 11/30/14 0263  . carbamazepine (TEGRETOL) tablet 200 mg  200 mg Oral BID PC Benjamine Mola, FNP   200 mg at 11/30/14 0906  . chlordiazePOXIDE (LIBRIUM) capsule 25 mg  25 mg Oral Q6H PRN Shuvon Rankin, NP      .  chlordiazePOXIDE (LIBRIUM) capsule 25 mg  25 mg Oral QID Shuvon Rankin, NP   25 mg at 11/30/14 1229   Followed by  . [START ON 12/01/2014] chlordiazePOXIDE (LIBRIUM) capsule 25 mg  25 mg Oral TID Shuvon Rankin, NP       Followed by  . [START ON 12/02/2014] chlordiazePOXIDE (LIBRIUM) capsule 25 mg  25 mg Oral BH-qamhs Shuvon Rankin, NP       Followed by  . [START ON 12/03/2014] chlordiazePOXIDE (LIBRIUM) capsule 25 mg  25 mg Oral Daily Shuvon Rankin, NP      . hydrOXYzine (ATARAX/VISTARIL) tablet 25 mg  25 mg Oral Q6H  PRN Nicholaus Bloom, MD   25 mg at 11/30/14 0910  . ketorolac (TORADOL) tablet 10 mg  10 mg Oral Q6H PRN Laverle Hobby, PA-C   10 mg at 11/30/14 0910  . loperamide (IMODIUM) capsule 2-4 mg  2-4 mg Oral PRN Shuvon Rankin, NP      . magnesium hydroxide (MILK OF MAGNESIA) suspension 30 mL  30 mL Oral Daily PRN Benjamine Mola, FNP      . multivitamin with minerals tablet 1 tablet  1 tablet Oral Daily Shuvon Rankin, NP   1 tablet at 11/30/14 0906  . nicotine (NICODERM CQ - dosed in mg/24 hours) patch 21 mg  21 mg Transdermal Daily Benjamine Mola, FNP   21 mg at 11/30/14 0623  . ondansetron (ZOFRAN) tablet 4 mg  4 mg Oral Q8H PRN Benjamine Mola, FNP   4 mg at 11/29/14 7628  . thiamine (VITAMIN B-1) tablet 100 mg  100 mg Oral Daily Benjamine Mola, FNP   100 mg at 11/30/14 3151   Or  . thiamine (B-1) injection 100 mg  100 mg Intravenous Daily Benjamine Mola, FNP      . venlafaxine (EFFEXOR) tablet 75 mg  75 mg Oral BID Benjamine Mola, FNP   75 mg at 11/30/14 7616   Current Outpatient Prescriptions  Medication Sig Dispense Refill  . amitriptyline (ELAVIL) 25 MG tablet Take 1 tablet (25 mg total) by mouth at bedtime. 30 tablet 3  . cetirizine (ZYRTEC) 10 MG tablet Take 10 mg by mouth daily.    [name] venlafaxine (EFFEXOR) 37.5 MG tablet Take 2 tablets twice daily (Patient taking differently: Take 75 mg by mouth 2 (two) times daily. ) 120 tablet 2    Lab Results: No results found for this or any previous visit (from the past 48 hour(s)).  Physical Findings: AIMS: Facial and Oral Movements Muscles of Facial Expression: None, normal Lips and Perioral Area: None, normal Jaw: None, normal Tongue: None, normal,Extremity Movements Upper (arms, wrists, hands, fingers): None, normal Lower (legs, knees, ankles, toes): None, normal, Trunk Movements Neck, shoulders, hips: None, normal, Overall Severity Severity of abnormal movements (highest score from questions above): None, normal Incapacitation due to  abnormal movements: None, normal Patient's awareness of abnormal movements (rate only patient's report): No Awareness, Dental Status Current problems with teeth and/or dentures?: Yes Does patient usually wear dentures?: No  CIWA:  CIWA-Ar Total: 14 COWS:  COWS Total Score: 7  Treatment Plan Summary: Daily contact with patient to assess and evaluate symptoms and progress in treatment Medication management   Plan: Supportive approach/coping skills/relapse prevention           Alcohol Dependence: continue Librium detox protocol           Headache/migraine: will sent to the ED for IV management of his acute/instractable  migraine Medical Decision Making Problem Points:  Established problem, worsening (2) and Review of psycho-social stressors (1) Data Points:  Review of medication regiment & side effects (2) Review of new medications or change in dosage (2)  I certify that inpatient services furnished can reasonably be expected to improve the patient's condition.   Coleta Grosshans A 11/30/2014, 3:48 PM

## 2014-11-30 NOTE — Plan of Care (Signed)
Problem: Alteration in mood & ability to function due to Goal: STG: Patient verbalizes decreases in signs of withdrawal Outcome: Not Progressing Patient's CIWA still quite elevated Goal: STG-Patient will report withdrawal symptoms Outcome: Progressing Patient able to complete CIWA with RN

## 2014-11-30 NOTE — ED Notes (Signed)
[facility] transportation called to take pt back to [facility]

## 2014-11-30 NOTE — ED Notes (Signed)
PA at bedside.

## 2014-11-30 NOTE — Progress Notes (Signed)
Patient with unrelenting and worsening migraine. Was treated with dilaudid, magnesium and solu-medrol IM yesterday with no relief. Verified via echart with [facility] that patient receives these meds via IV for migraines. Order received to transfer to ED without staff (no SI). Patient made aware. Report called to [place] as Therapist, occupational indicates they are above capacity. Awaiting arrival of [facility] for transport. Pain still at a 10+. Jamie Kato

## 2014-11-30 NOTE — ED Provider Notes (Signed)
CSN: [id number]     Arrival date & time 11/30/14  1209 History   First MD Initiated Contact with Patient 11/30/14 1215     Chief Complaint  Patient presents with  . Migraine     (Consider location/radiation/quality/duration/timing/severity/associated sxs/prior Treatment) HPI Comments: Patient sent to the emergency department from behavioral health for chief complaint of migraine headache. Patient has a significant history of migraine headaches and seizure disorder. He is currently staying at behavioral health for alcohol detox. He states that he has had a progressively worsening migraine for the past 3 days. His aggravated by noise and light. He states that nothing makes it better. He denies any fevers, chills, neck stiffness, weakness, numbness, or tingling of the extremities.  The history is provided by the patient. No language interpreter was used.    Past Medical History  Diagnosis Date  . Epilepsy   . Migraine   . Anxiety   . Depression    Past Surgical History  Procedure Laterality Date  . Knee arthroscopy Right   . Appendectomy     Family History  Problem Relation Age of Onset  . Heart disease Mother   . Stroke Mother   . Heart disease Father   . Hypertension Father    History  Substance Use Topics  . Smoking status: Current Every Day Smoker -- 1.00 packs/day for 35 years  . Smokeless tobacco: Not on file  . Alcohol Use: 12.0 oz/week    10 Cans of beer, 10 Shots of liquor per week     Comment: drinks 1/5th vodka and 10 beers daily    Review of Systems  Constitutional: Negative for fever and chills.  Respiratory: Negative for shortness of breath.   Cardiovascular: Negative for chest pain.  Gastrointestinal: Negative for nausea, vomiting, diarrhea and constipation.  Genitourinary: Negative for dysuria.  Neurological: Positive for headaches.  All other systems reviewed and are negative.     Allergies  Codeine  Home Medications   Prior to Admission  medications   Medication Sig Start Date End Date Taking? Authorizing Provider  amitriptyline (ELAVIL) 25 MG tablet Take 1 tablet (25 mg total) by mouth at bedtime. 10/25/14  Yes Cameron Sprang, MD  cetirizine (ZYRTEC) 10 MG tablet Take 10 mg by mouth daily.   Yes Historical Provider, MD  venlafaxine (EFFEXOR) 37.5 MG tablet Take 2 tablets twice daily Patient taking differently: Take 75 mg by mouth 2 (two) times daily.  11/20/14  Yes Cameron Sprang, MD   BP 155/98 mmHg  Pulse 57  Temp(Src) 97.5 F (36.4 C) (Oral)  Resp 20  Ht 6' 0.5" (1.842 m)  Wt 164 lb (74.39 kg)  BMI 21.92 kg/m2  SpO2 100% Physical Exam  Constitutional: He is oriented to person, place, and time. He appears well-developed and well-nourished.  HENT:  Head: Normocephalic and atraumatic.  Right Ear: External ear normal.  Left Ear: External ear normal.  Eyes: Conjunctivae and EOM are normal. Pupils are equal, round, and reactive to light.  Neck: Normal range of motion. Neck supple.  No pain with neck flexion, no meningismus  Cardiovascular: Normal rate, regular rhythm and normal heart sounds.  Exam reveals no gallop and no friction rub.   No murmur heard. Pulmonary/Chest: Effort normal and breath sounds normal. No respiratory distress. He has no wheezes. He has no rales. He exhibits no tenderness.  Abdominal: Soft. He exhibits no distension and no mass. There is no tenderness. There is no rebound and no guarding.  Musculoskeletal: Normal range of motion. He exhibits no edema or tenderness.  Normal gait.  Neurological: He is alert and oriented to person, place, and time. He has normal reflexes.  CN 3-12 intact, normal finger to nose, no pronator drift, sensation and strength intact bilaterally.  Skin: Skin is warm and dry.  Psychiatric: He has a normal mood and affect. His behavior is normal. Judgment and thought content normal.  Nursing note and vitals reviewed.   ED Course  Procedures (including critical care  time) Results for orders placed or performed during the hospital encounter of 11/27/14  Acetaminophen level  Result Value Ref Range   Acetaminophen (Tylenol), Serum <10.0 (L) 10 - 30 ug/mL  CBC  Result Value Ref Range   WBC 7.0 4.0 - 10.5 K/uL   RBC 5.06 4.22 - 5.81 MIL/uL   Hemoglobin 16.1 13.0 - 17.0 g/dL   HCT 47.3 39.0 - 52.0 %   MCV 93.5 78.0 - 100.0 fL   MCH 31.8 26.0 - 34.0 pg   MCHC 34.0 30.0 - 36.0 g/dL   RDW 15.0 11.5 - 15.5 %   Platelets 277 150 - 400 K/uL  Comprehensive metabolic panel  Result Value Ref Range   Sodium 135 135 - 145 mmol/L   Potassium 3.8 3.5 - 5.1 mmol/L   Chloride 96 96 - 112 mEq/L   CO2 27 19 - 32 mmol/L   Glucose, Bld 105 (H) 70 - 99 mg/dL   BUN 8 6 - 23 mg/dL   Creatinine, Ser 0.98 0.50 - 1.35 mg/dL   Calcium 9.8 8.4 - 10.5 mg/dL   Total Protein 8.2 6.0 - 8.3 g/dL   Albumin 4.9 3.5 - 5.2 g/dL   AST 36 0 - 37 U/L   ALT 23 0 - 53 U/L   Alkaline Phosphatase 84 39 - 117 U/L   Total Bilirubin 0.4 0.3 - 1.2 mg/dL   GFR calc non Af Amer >90 >90 mL/min   GFR calc Af Amer >90 >90 mL/min   Anion gap 12 5 - 15  Ethanol (ETOH)  Result Value Ref Range   Alcohol, Ethyl (B) 276 (H) 0 - 9 mg/dL  Salicylate level  Result Value Ref Range   Salicylate Lvl <2.6 2.8 - 20.0 mg/dL  Urine Drug Screen  Result Value Ref Range   Opiates NONE DETECTED NONE DETECTED   Cocaine NONE DETECTED NONE DETECTED   Benzodiazepines NONE DETECTED NONE DETECTED   Amphetamines NONE DETECTED NONE DETECTED   Tetrahydrocannabinol POSITIVE (A) NONE DETECTED   Barbiturates NONE DETECTED NONE DETECTED   Ct Head Wo Contrast  11/20/2014   CLINICAL DATA:  Initial evaluation for disorientation with nausea and vomiting, patient had seizure 3 days ago and fell hitting his head  EXAM: CT HEAD WITHOUT CONTRAST  TECHNIQUE: Contiguous axial images were obtained from the base of the skull through the vertex without intravenous contrast.  COMPARISON:  12/04/2013, 12/06/2013  FINDINGS: There is  mild diffuse atrophy. There is no abnormal attenuation to suggest hemorrhage or mass. No evidence of infarct or extra-axial fluid. No hydrocephalus. No skull fracture. Mild to moderate inflammatory change in the ethmoid air cells.  IMPRESSION: Involutional change with no acute findings.   Electronically Signed   By: Skipper Cliche M.D.   On: 11/20/2014 20:38   Dg Chest Port 1 View  11/20/2014   CLINICAL DATA:  Family reports that the patient had a seizure on Saturday and fell hitting his head. Pt has been disoriented since then,  and having n/v. Pt with dry heaves at triage. H/o seizures and migraines. Pt unable to lay completely still for x-ray due to pain and vomiting.  EXAM: PORTABLE CHEST - 1 VIEW  COMPARISON:  08/02/2013.  FINDINGS: The left lateral lung base is not included. The included lungs are clear. Normal sized heart. No fracture or pneumothorax seen. Small amount of right neck calcification.  IMPRESSION: 1. No acute abnormality. 2. The left lateral lung base is excluded. 3. Small amount of right carotid artery atheromatous calcification.   Electronically Signed   By: Enrique Sack M.D.   On: 11/20/2014 19:23      EKG Interpretation None      MDM   Final diagnoses:  Headache, unspecified headache type    Prior neurology notes reviewed.  Patient has history of complex migraines.  Will aim for pain control in the ED.  Patient still having bad headache.  Discussed with Dr. Rogene [place], recommends additional pain medications.  Prior CT scans and lumbar punctures reviewed.  Doubt mass, bleed, or infection.    2:44 PM Headache is significantly improved, but still present. Patient asks for 1 more dose prior to discharge.  Will give 1 additional dose, plan for discharge back to [facility]   4:03 PM After third round of pain medicine, patient says that his headache has improved drastically. He feels ready to be discharged from the emergency department, and will return back to [facility].  Pt HA treated  and improved while in ED.  Presentation is like pts typical HA and non concerning for [facility], ICH, Meningitis, or temporal arteritis. Pt is afebrile with no focal neuro deficits, nuchal rigidity, or change in vision. Pt is to follow up with PCP to discuss prophylactic medication. Pt verbalizes understanding and is agreeable with plan to dc.    Montine Circle, PA-C 11/30/14 1616  Fredia Sorrow, MD 11/30/14 [phone]

## 2014-11-30 NOTE — ED Notes (Signed)
Pt states he has hx of migranes and seizures.  Migraine started last Saturday.  Pt states pain 10/10.  He was transferred from [facility].

## 2014-11-30 NOTE — Discharge Instructions (Signed)

## 2014-11-30 NOTE — Progress Notes (Signed)
Patient returned from ED at 1730 in stable condition. Reports pain is improved at 5/10. Will medicate per orders for 1700, 1800. Pt safe. Jamie Kato

## 2014-12-01 DIAGNOSIS — G43909 Migraine, unspecified, not intractable, without status migrainosus: Secondary | ICD-10-CM

## 2014-12-01 DIAGNOSIS — F10239 Alcohol dependence with withdrawal, unspecified: Principal | ICD-10-CM

## 2014-12-01 DIAGNOSIS — F10230 Alcohol dependence with withdrawal, uncomplicated: Secondary | ICD-10-CM | POA: Insufficient documentation

## 2014-12-01 MED ORDER — CHLORDIAZEPOXIDE HCL 25 MG PO CAPS: 25.0000 mg | ORAL_CAPSULE | Freq: Three times a day (TID) | ORAL | Status: DC

## 2014-12-01 MED ORDER — ATENOLOL 50 MG PO TABS: 50.0000 mg | ORAL_TABLET | Freq: Two times a day (BID) | ORAL | Status: DC

## 2014-12-01 MED ORDER — ASPIRIN 81 MG PO TBEC: 81.0000 mg | DELAYED_RELEASE_TABLET | Freq: Every day | ORAL | Status: DC

## 2014-12-01 MED ORDER — CARBAMAZEPINE 200 MG PO TABS: 200.0000 mg | ORAL_TABLET | Freq: Two times a day (BID) | ORAL | Status: DC

## 2014-12-01 MED ORDER — CHLORDIAZEPOXIDE HCL 25 MG PO CAPS: 25.0000 mg | ORAL_CAPSULE | Freq: Every day | ORAL | Status: DC

## 2014-12-01 MED ORDER — HYDROXYZINE HCL 25 MG PO TABS: 25.0000 mg | ORAL_TABLET | Freq: Four times a day (QID) | ORAL | Status: DC | PRN

## 2014-12-01 MED ORDER — CHLORDIAZEPOXIDE HCL 25 MG PO CAPS: ORAL_CAPSULE | ORAL | Status: DC

## 2014-12-01 MED ORDER — AMITRIPTYLINE HCL 25 MG PO TABS: 25.0000 mg | ORAL_TABLET | Freq: Every day | ORAL | Status: DC

## 2014-12-01 MED ORDER — CHLORDIAZEPOXIDE HCL 25 MG PO CAPS: 25.0000 mg | ORAL_CAPSULE | ORAL | Status: DC

## 2014-12-01 MED ORDER — VENLAFAXINE HCL 75 MG PO TABS: 75.0000 mg | ORAL_TABLET | Freq: Two times a day (BID) | ORAL | Status: DC

## 2014-12-01 NOTE — [facility] Group Notes (Signed)
[facility] LCSW Group Therapy  Feelings Around Relapse 1:15 -2:30        12/01/2014   Type of Therapy:  Group Therapy  Participation Level:  Minimal  Participation Quality: Minimal  Affect:  Flat, Depressed  Cognitive:   Appropriate  Insight:  Developing/Improving  Engagement in Therapy: Developing/Improving  Modes of Intervention:  Discussion Exploration Problem-Solving Supportive  Summary of Progress/Problems:  The topic for today was feelings around relapse.  Patient did not engage in the discussion.   Concha Pyo 12/01/2014

## 2014-12-01 NOTE — Progress Notes (Signed)
[facility] Adult Case Management Discharge Plan :  Will you be returning to the same living situation after discharge: Yes,  Patient is returning to his home. At discharge, do you have transportation home?:Yes,  [facility] will transport patient home. Do you have the ability to pay for your medications:No.  Patient will be assisted with indigent medications.  Release of information consent forms completed and in the chart;  Patient's signature needed at discharge.  Patient to Follow up at: Follow-up Information    Follow up with [facility] On 12/05/2014.   Why:  You are scheduled with [facility] on Tuesday, December 05, 2013 at 8:15 AM.  Please bring photo ID and insurance card   Contact information:   [address] [place], [place]   [address]  [phone]      Follow up with [facility].   Specialty:  Neurology   Contact information:   [place] [place] [id number] [phone]      Patient denies SI/HI: Patient was not endorsing SI/HI or other thoughts of self harm on admission nor during hospitalization..   Safety Planning and Suicide Prevention discussed: . Patient refused referral  Concha Pyo 12/01/2014, 10:30 AM

## 2014-12-01 NOTE — Discharge Summary (Signed)
Physician Discharge Summary Note  Patient:  Spencer Mendoza is an 54 y.o., male MRN:  [id number] DOB:  1961/04/04 Patient phone:  [phone] (home)  Patient address:   [place] [address],  Total Time spent with patient: Greater than 30 minutes  Date of Admission:  11/28/2014 Date of Discharge: 12/01/14  Reason for Admission: Alcoholism withdrawal symptoms  Discharge Diagnoses: Active Problems:   Alcohol withdrawal   Alcohol dependence   Migraines   Alcohol dependence with uncomplicated withdrawal   Headache, migraine   Psychiatric Specialty Exam: Physical Exam  Psychiatric: His speech is normal and behavior is normal. Judgment and thought content normal. His mood appears not anxious. His affect is not angry, not blunt, not labile and not inappropriate. Cognition and memory are normal. He does not exhibit a depressed mood.    Review of Systems  Constitutional: Negative.   HENT: Negative.   Eyes: Negative.   Respiratory: Negative.   Cardiovascular: Negative.   Genitourinary: Negative.   Musculoskeletal: Negative.   Skin: Negative.   Neurological: Negative.   Endo/Heme/Allergies: Negative.   Psychiatric/Behavioral: Positive for substance abuse (Hx: Alcoholism, chronic ). Negative for depression, suicidal ideas, hallucinations and memory loss. The patient has insomnia (Stable). The patient is not nervous/anxious.   All other systems reviewed and are negative.   Blood pressure 118/78, pulse 58, temperature 98.1 F (36.7 C), temperature source Oral, resp. rate 16, height 6' 0.5" (1.842 m), weight 74.39 kg (164 lb), SpO2 93 %.Body mass index is 21.92 kg/(m^2).       General Appearance: Fairly Groomed  Engineer, water:: Fair  Speech: Clear and Coherent  Volume: Normal  Mood: Euthymic  Affect: Appropriate  Thought Process: Coherent and Goal Directed  Orientation: Full (Time, Place, and Person)  Thought Content: plans as he moves on,  relapse prevention plan  Suicidal Thoughts: No  Homicidal Thoughts: No  Memory: Immediate; Fair Recent; Fair Remote; Fair  Judgement: Fair  Insight: Present and Shallow  Psychomotor Activity: Normal  Concentration: Fair  Recall: [employer] [place]  Language: Fair  Akathisia: No  Handed: Right  AIMS (if indicated):    Assets: Desire for Improvement Housing Social Support  Sleep: Number of Hours: 6   Past Psychiatric History: Diagnosis: Alcohol Abuse, sever; Substance induced mood disorder  Hospitalizations: Detox/rehab 2 prior  Outpatient Care: Denies  Substance Abuse Care: ETOH, THC  Self-Mutilation: Denies  Suicidal Attempts: Denies  Violent Behaviors: Denies   Musculoskeletal: Strength & Muscle Tone: within normal limits Gait & Station: normal Patient leans: Right  DSM5: Schizophrenia Disorders:  NA Obsessive-Compulsive Disorders:  NA Trauma-Stressor Disorders:  NA Substance/Addictive Disorders:  Alcohol Related Disorder - Severe (303.90) Depressive Disorders:  NA Discharge Diagnoses:  AXIS I: Alcohol Dependence, S/P alcohol withdrawal AXIS II: No diagnosis AXIS III:  Past Medical History  Diagnosis Date  . Epilepsy   . Migraine   . Anxiety   . Depression    AXIS IV: other psychosocial or environmental problems AXIS V: 61-70 mild symptoms  Level of Care:  OP  Hospital Course:  Patient presented to [facility] requesting assistance with alcohol detox, irritable mood, and headache.Today during interview patient states that he came to hospital to get help with alcohol detox. "I had started drinking a fifth of liquor a day with a few beers added. I done had some blackouts. I guess I got drunk the other day and showed my ass and now my wife is staying with her mama. I  just want to get off this stuff and get it together."  [name] was admitted to the hospital with his toxicology tests result  showing a blood alcohol level of 276 and UDS result showing positive THC. He reported hx of some black-out as well. [name] went on to report how much alcoholism has affected his marriage as his wife is currently staying with her mother. He required alcohol detoxification treatments. [name] received Librium detox protocols. He was also medicated and discharged on Effexor 75 mg daily for depression, Tegretol 200 mg twice daily for mood stabilization/seizure disorder and Hydroxyzine 25 mg Q 6 hours prn for anxiety. He was also started on Tenormin for migraine headaches/HTN and Elavil 25 mg for sleep. [name] was enrolled & participated in the group counseling sessions being offered and held on this unit. He learned coping skills.  [name] withdrawal symptoms is responding to his detox regimen and his mood is stable. He has asked to bed discharged to his home. And because [name] is yet to complete his detoxification treatments, he is being discharged with the prescription for the remaining doses of his Librium 25 capsules detox regimen to complete at home. Instruction on how to use is included. He has been referred and scheduled for psychiatric follow-up care at the [facility] clinic in [place], [place] & at the [facility] here in [place], [place] for his seizure disorder & headache pains.  Upon discharge, he adamantly denies any SIHI, AVH, delusional thoughts, paranoia and or withdrawal symptoms. He is provided with all the necessary information required to make this appointment without problems. Upon discharge, he adamantly denies any SIHI, AVH, delusional thoughts, paranoia and or substance withdrawal symptoms. He received from the [facility], a 14 days worth, supply samples of his [facility] discharge medications. He left [facility] withy all personal belongings in no apparent distress. Transportation per wife.  Consults:  psychiatry  Significant Diagnostic Studies:  labs: CBC with diff, CMP, UDS, toxicology tests, U/A. results  reviewed, no changes  Discharge Vitals:   Blood pressure 118/78, pulse 58, temperature 98.1 F (36.7 C), temperature source Oral, resp. rate 16, height 6' 0.5" (1.842 m), weight 74.39 kg (164 lb), SpO2 93 %. Body mass index is 21.92 kg/(m^2). Lab Results:   No results found for this or any previous visit (from the past 72 hour(s)).  Physical Findings: AIMS: Facial and Oral Movements Muscles of Facial Expression: None, normal Lips and Perioral Area: None, normal Jaw: None, normal Tongue: None, normal,Extremity Movements Upper (arms, wrists, hands, fingers): None, normal Lower (legs, knees, ankles, toes): None, normal, Trunk Movements Neck, shoulders, hips: None, normal, Overall Severity Severity of abnormal movements (highest score from questions above): None, normal Incapacitation due to abnormal movements: None, normal Patient's awareness of abnormal movements (rate only patient's report): No Awareness, Dental Status Current problems with teeth and/or dentures?: Yes Does patient usually wear dentures?: No  CIWA:  CIWA-Ar Total: 5 COWS:  COWS Total Score: 7  Psychiatric Specialty Exam: See Psychiatric Specialty Exam and Suicide Risk Assessment completed by Attending Physician prior to discharge.  Discharge destination:  Home  Is patient on multiple antipsychotic therapies at discharge:  No   Has Patient had three or more failed trials of antipsychotic monotherapy by history:  No  Recommended Plan for Multiple Antipsychotic Therapies: NA    Medication List    STOP taking these medications        cetirizine 10 MG tablet  Commonly known as:  ZYRTEC      TAKE  these medications      Indication   amitriptyline 25 MG tablet  Commonly known as:  ELAVIL  Take 1 tablet (25 mg total) by mouth at bedtime. For sleep   Indication:  Trouble Sleeping     aspirin 81 MG EC tablet  Take 1 tablet (81 mg total) by mouth daily. (May purchase this medicine from over the counter at  local pharmacy): For heart health   Indication:  Heart health     atenolol 50 MG tablet  Commonly known as:  TENORMIN  Take 1 tablet (50 mg total) by mouth 2 (two) times daily. For high blood pressure   Indication:  High Blood Pressure     carbamazepine 200 MG tablet  Commonly known as:  TEGRETOL  Take 1 tablet (200 mg total) by mouth 2 (two) times daily after a meal. For seizures   Indication:  Alcohol Withdrawal Syndrome, Complex Partial Epilepsy     chlordiazePOXIDE 25 MG capsule  Commonly known as:  LIBRIUM  - Take 25 mg (1capsule) at bedtime on 12-01-14: For alcohol withdrawal symptoms.  - Take 25 mg (1 capsule) three times daily on 12-02-14: For alcohol withdrawal symptoms.  - Take 25 mg (1 capsule) twice daily on 12-03-13: For alcohol withdrawal symptoms.  - Take 25 mg (1 capsule) once daily on 12-04-14: For alcohol withdrawal symptoms.  - Disp: #7 capsules.   Indication:  Acute Alcohol Withdrawal Syndrome     hydrOXYzine 25 MG tablet  Commonly known as:  ATARAX/VISTARIL  Take 1 tablet (25 mg total) by mouth every 6 (six) hours as needed for anxiety.   Indication:  Tension, Anxiety     venlafaxine 75 MG tablet  Commonly known as:  EFFEXOR  Take 1 tablet (75 mg total) by mouth 2 (two) times daily. For depression   Indication:  Major Depressive Disorder       Follow-up Information    Follow up with [facility] On 12/05/2014.   Why:  You are scheduled with [facility] on Tuesday, December 05, 2013 at 8:15 AM.  Please bring photo ID and insurance card   Contact information:   [address] [place], [place]   [address]  [phone]      Follow up with [facility] [employer].   Specialty:  Neurology   Contact information:   [place], [place] [address] [phone]     Follow-up recommendations: Activity:  As tolerated Diet: As recommended by your primary care doctor. Keep all scheduled follow-up appointments  as recommended.   Comments: Take all your medications as prescribed by your mental healthcare provider. Report any adverse effects and or reactions from your medicines to your outpatient provider promptly. Patient is instructed and cautioned to not engage in alcohol and or illegal drug use while on prescription medicines. In the event of worsening symptoms, patient is instructed to call the crisis hotline, 911 and or go to the nearest ED for appropriate evaluation and treatment of symptoms. Follow-up with your primary care provider for your other medical issues, concerns and or health care needs.   Total Discharge Time:  Greater than 30 minutes.  Signed: Encarnacion Slates, PMHNP, FNP-BC 12/01/2014, 3:25 PM  I personally assessed the patient and formulated the plan Geralyn Flash A. Sabra Heck, M.D.

## 2014-12-01 NOTE — BHH Suicide Risk Assessment (Signed)
Suicide Risk Assessment  Discharge Assessment     Demographic Factors:  Male and Caucasian  Total Time spent with patient: 30 minutes  Psychiatric Specialty Exam:     Blood pressure 118/78, pulse 58, temperature 98.1 F (36.7 C), temperature source Oral, resp. rate 16, height 6' 0.5" (1.842 m), weight 74.39 kg (164 lb), SpO2 93 %.Body mass index is 21.92 kg/(m^2).  General Appearance: Fairly Groomed  Engineer, water::  Fair  Speech:  Clear and Coherent  Volume:  Normal  Mood:  Euthymic  Affect:  Appropriate  Thought Process:  Coherent and Goal Directed  Orientation:  Full (Time, Place, and Person)  Thought Content:  plans as he moves on, relapse prevention plan  Suicidal Thoughts:  No  Homicidal Thoughts:  No  Memory:  Immediate;   Fair Recent;   Fair Remote;   Fair  Judgement:  Fair  Insight:  Present and Shallow  Psychomotor Activity:  Normal  Concentration:  Fair  Recall:  [employer]:Fair  Language: Fair  Akathisia:  No  Handed:  Right  AIMS (if indicated):     Assets:  Desire for Improvement Housing Social Support  Sleep:  Number of Hours: 6    Musculoskeletal: Strength & Muscle Tone: within normal limits Gait & Station: normal Patient leans: N/A   Mental Status Per Nursing Assessment::   On Admission:     Current Mental Status by Physician: In full contact with reality. There are no active S/S of withdrawal. There are no active SI plans or intent. He states his migraine is under control. He feels he is ready to go back home. He is counting that his wife is going to be there for him and they are going to work on the relationship. He understands that there are going to be trust issues but he is willing to accept it and address these issues with her. He is going to be staying with his father who he is taking care of as he has cancer. Does state that he got in the routine of staying in the house with his father and had nothing to do but drink. He states  he is working on changing this routine. He is willing to pursue further outpatient treatment   Loss Factors: NA  Historical Factors: NA  Risk Reduction Factors:   Sense of responsibility to family, Living with another person, especially a relative and Positive social support  Continued Clinical Symptoms:  Alcohol/Substance Abuse/Dependencies  Cognitive Features That Contribute To Risk:  Closed-mindedness Polarized thinking Thought constriction (tunnel vision)    Suicide Risk:  Minimal: No identifiable suicidal ideation.  Patients presenting with no risk factors but with morbid ruminations; may be classified as minimal risk based on the severity of the depressive symptoms  Discharge Diagnoses:   AXIS I:  Alcohol Dependence, S/P alcohol withdrawal AXIS II:  No diagnosis AXIS III:   Past Medical History  Diagnosis Date  . Epilepsy   . Migraine   . Anxiety   . Depression    AXIS IV:  other psychosocial or environmental problems AXIS V:  61-70 mild symptoms  Plan Of Care/Follow-up recommendations:  Activity:  as tolerated Diet:  regular Follow up outpatient basis/AA Is patient on multiple antipsychotic therapies at discharge:  No   Has Patient had three or more failed trials of antipsychotic monotherapy by history:  No  Recommended Plan for Multiple Antipsychotic Therapies: NA    Dwyne Hasegawa A 12/01/2014, 12:44 PM

## 2014-12-01 NOTE — Progress Notes (Signed)
The patient did not attend last evening's Karaoke group and elected to remain in his bedroom.

## 2014-12-01 NOTE — Progress Notes (Signed)
Spencer Mendoza is given his DC AVS, his DC instructions and his sample meds along with his prescriptions by this Probation officer . HE completes his morning assessment this morning and on it he writes  He denies SI within the past 24 hrs and he rates his depression, hopelessness and anxiety " 0/0/5", respectively.   A He  Got very upset around 1400, when  he found out his wife would not be picking him up,    R Safety is in place. Pt took  his meds  And  He was given DC teaching and stated he understood as evidenced by his ability to reiterate DC teaching, ie med dosage, administration time, etc. He contacts for safety , all belongings are returned and he was escorted to bld entrance and dc'Spencer home.

## 2014-12-01 NOTE — BHH Group Notes (Signed)
[facility] LCSW Aftercare Discharge Planning Group Note   12/01/2014 10:56 AM    Participation Quality:  Appropraite  Mood/Affect:  Appropriate  Depression Rating:  0  Anxiety Rating:  3  Thoughts of Suicide:  No  Will you contract for safety?   NA  Current AVH:  No  Plan for Discharge/Comments:  Patient attended discharge planning group and actively participated in group. He reports feeling better and hopes to discharge home today.  He will follow up with [facility]. Suicide prevention education reviewed and SPE document provided.   Transportation Means: Patient has transportation.   Supports:  Patient has a support system.   Mckenlee Mangham, Eulas Post

## 2014-12-01 NOTE — Tx Team (Signed)
Interdisciplinary Treatment Plan Update   Date Reviewed:  12/01/2014  Time Reviewed:  11:00 AM  Progress in Treatment:   Attending groups: Yes Participating in groups: Yes Taking medication as prescribed: Yes  Tolerating medication: Yes Family/Significant other contact made:  No,collateral contact with wife. Patient understands diagnosis: Yes  Discussing patient identified problems/goals with staff: Yes Medical problems stabilized or resolved: Yes Denies suicidal/homicidal ideation: Yes Patient has not harmed self or others: Yes  For review of initial/current patient goals, please see plan of care.  Estimated Length of Stay:  Discharge today  Reasons for Continued Hospitalization:    New Problems/Goals identified:    Discharge Plan or Barriers:   Home with outpatient follow up with [facility] Additional Comments:  Patient and CSW reviewed patient's identified goals and treatment plan.  Patient verbalized understanding and agreed to treatment plan.   Attendees:  Patient:  12/01/2014 11:00 AM   Signature:  Gabriel Earing, MD 12/01/2014 11:00 AM  Signature: Carlton Adam, MD 12/01/2014 11:00 AM  Signature: Talbert Cage, RN 12/01/2014 11:00 AM  Signature: Marcella Dubs, RN 12/01/2014 11:00 AM  Signature:   12/01/2014 11:00 AM  Signature:  Joette Catching, LCSW 12/01/2014 11:00 AM  Signature:  12/01/2014 11:00 AM  Signature:  Lucinda Dell, Care Coordinator [facility] 12/01/2014 11:00 AM  Signature:   12/01/2014 11:00 AM  Signature:  12/01/2014  11:00 AM  Signature:   Lars Pinks, RN URCM 12/01/2014  11:00 AM  Signature:  [place] Worker LCSW 12/01/2014  11:00 AM    Scribe for Treatment Team:   Joette Catching,  12/01/2014 11:00 AM

## 2014-12-01 NOTE — Clinical Social Work Note (Signed)
CSW spoke with patient's wife, Meshach Perry, [phone],FBX was advised patient is discharging from the hospital today.  Wife advised that due to patient being sober, she plans to pick him up for discharge.  She advised she feels safe to do and has no concerns he will harm her.

## 2014-12-05 NOTE — Progress Notes (Signed)
Patient Discharge Instructions:  After Visit Summary (AVS):   Faxed to:  12/05/14 Discharge Summary Note:   Faxed to:  12/05/14 Psychiatric Admission Assessment Note:   Faxed to:  12/05/14 Suicide Risk Assessment - Discharge Assessment:   Faxed to:  12/05/14 Faxed/Sent to the Next Level Care provider:  12/05/14 Faxed to [facility] [place], 12/05/2014, 3:48 PM

## 2014-12-27 ENCOUNTER — Encounter (HOSPITAL_COMMUNITY): Payer: Self-pay

## 2014-12-27 ENCOUNTER — Emergency Department (HOSPITAL_COMMUNITY)
Admission: EM | Admit: 2014-12-27 | Discharge: 2014-12-27 | Disposition: A | Discharge status: Home / Self Care | Payer: Medicaid Other | Attending: Emergency Medicine | Admitting: Emergency Medicine

## 2014-12-27 DIAGNOSIS — Z720 Tobacco use: Secondary | ICD-10-CM | POA: Insufficient documentation

## 2014-12-27 DIAGNOSIS — Z7982 Long term (current) use of aspirin: Secondary | ICD-10-CM | POA: Diagnosis not present

## 2014-12-27 DIAGNOSIS — F419 Anxiety disorder, unspecified: Secondary | ICD-10-CM | POA: Insufficient documentation

## 2014-12-27 DIAGNOSIS — Z79899 Other long term (current) drug therapy: Secondary | ICD-10-CM | POA: Diagnosis not present

## 2014-12-27 DIAGNOSIS — F329 Major depressive disorder, single episode, unspecified: Secondary | ICD-10-CM | POA: Diagnosis not present

## 2014-12-27 DIAGNOSIS — R251 Tremor, unspecified: Secondary | ICD-10-CM | POA: Diagnosis present

## 2014-12-27 DIAGNOSIS — G43909 Migraine, unspecified, not intractable, without status migrainosus: Secondary | ICD-10-CM | POA: Diagnosis not present

## 2014-12-27 DIAGNOSIS — G40909 Epilepsy, unspecified, not intractable, without status epilepticus: Secondary | ICD-10-CM | POA: Diagnosis not present

## 2014-12-27 DIAGNOSIS — R519 Headache, unspecified: Secondary | ICD-10-CM

## 2014-12-27 DIAGNOSIS — R51 Headache: Secondary | ICD-10-CM

## 2014-12-27 LAB — I-STAT CHEM 8, ED
BUN: 18 mg/dL (ref 6–23)
Calcium, Ion: 1.16 mmol/L (ref 1.12–1.23)
Chloride: 101 mmol/L (ref 96–112)
Creatinine, Ser: 1.1 mg/dL (ref 0.50–1.35)
Glucose, Bld: 89 mg/dL (ref 70–99)
HCT: 49 % (ref 39.0–52.0)
Hemoglobin: 16.7 g/dL (ref 13.0–17.0)
Potassium: 4.5 mmol/L (ref 3.5–5.1)
Sodium: 138 mmol/L (ref 135–145)
TCO2: 23 mmol/L (ref 0–100)

## 2014-12-27 MED ORDER — PROCHLORPERAZINE EDISYLATE 5 MG/ML IJ SOLN
10.0000 mg | Freq: Once | INTRAMUSCULAR | Status: AC
  Administered 2014-12-27: 10 mg via INTRAVENOUS
  Filled 2014-12-27: qty 2

## 2014-12-27 MED ORDER — DEXAMETHASONE SODIUM PHOSPHATE 10 MG/ML IJ SOLN
10.0000 mg | Freq: Once | INTRAMUSCULAR | Status: AC
  Administered 2014-12-27: 10 mg via INTRAVENOUS
  Filled 2014-12-27: qty 1

## 2014-12-27 MED ORDER — MAGNESIUM SULFATE 2 GM/50ML IV SOLN
2.0000 g | Freq: Once | INTRAVENOUS | Status: AC
  Administered 2014-12-27: 2 g via INTRAVENOUS
  Filled 2014-12-27: qty 50

## 2014-12-27 MED ORDER — SODIUM CHLORIDE 0.9 % IV BOLUS (SEPSIS)
1000.0000 mL | Freq: Once | INTRAVENOUS | Status: AC
  Administered 2014-12-27: 1000 mL via INTRAVENOUS

## 2014-12-27 MED ORDER — DIPHENHYDRAMINE HCL 50 MG/ML IJ SOLN
50.0000 mg | Freq: Once | INTRAMUSCULAR | Status: AC
  Administered 2014-12-27: 50 mg via INTRAVENOUS
  Filled 2014-12-27: qty 1

## 2014-12-27 NOTE — ED Provider Notes (Signed)
CSN: [id number]     Arrival date & time 12/27/14  1736 History   First MD Initiated Contact with Patient 12/27/14 1741     Chief Complaint  Patient presents with  . Shaking  . Medication Reaction     (Consider location/radiation/quality/duration/timing/severity/associated sxs/prior Treatment) HPI 54 year old male past mental history below notable for substance abuse, seizures, migraine, anxiety depression who presents ED complaining of 3 days of migraine as well as generalize shaking/tremor. Patient states he is recently started on Effexor and Zoloft. She is having a typical migraine for the past few days. Family member coming patient states they are concerned about him having a tumor as a family member has history of brain tumor. Patient also notes he was recently in detox at [facility] Patient denies current drug or alcohol use.     Past Medical History  Diagnosis Date  . Epilepsy   . Migraine   . Anxiety   . Depression    Past Surgical History  Procedure Laterality Date  . Knee arthroscopy Right   . Appendectomy     Family History  Problem Relation Age of Onset  . Heart disease Mother   . Stroke Mother   . Heart disease Father   . Hypertension Father    History  Substance Use Topics  . Smoking status: Current Every Day Smoker -- 1.00 packs/day for 35 years  . Smokeless tobacco: Not on file  . Alcohol Use: 12.0 oz/week    10 Cans of beer, 10 Shots of liquor per week     Comment: drinks 1/5th vodka and 10 beers daily    Review of Systems  Constitutional: Negative for fever, activity change and appetite change.  HENT: Negative for congestion, rhinorrhea and sore throat.   Eyes: Negative for visual disturbance.  Respiratory: Negative for cough and shortness of breath.   Cardiovascular: Negative for chest pain, palpitations and leg swelling.  Gastrointestinal: Negative for nausea, vomiting, abdominal pain and diarrhea.  Genitourinary: Negative for dysuria, flank pain,  decreased urine volume and difficulty urinating.  Musculoskeletal: Negative for back pain and neck pain.  Skin: Negative for rash.  Neurological: Positive for tremors and headaches. Negative for dizziness, syncope, facial asymmetry, speech difficulty, weakness, light-headedness and numbness.  Psychiatric/Behavioral: Negative for confusion.      Allergies  Codeine  Home Medications   Prior to Admission medications   Medication Sig Start Date End Date Taking? Authorizing Provider  amitriptyline (ELAVIL) 25 MG tablet Take 1 tablet (25 mg total) by mouth at bedtime. For sleep 12/01/14   Encarnacion Slates, NP  aspirin EC 81 MG EC tablet Take 1 tablet (81 mg total) by mouth daily. (May purchase this medicine from over the counter at local pharmacy): For heart health 12/01/14   Encarnacion Slates, NP  atenolol (TENORMIN) 50 MG tablet Take 1 tablet (50 mg total) by mouth 2 (two) times daily. For high blood pressure 12/01/14   Encarnacion Slates, NP  carbamazepine (TEGRETOL) 200 MG tablet Take 1 tablet (200 mg total) by mouth 2 (two) times daily after a meal. For seizures 12/01/14   Encarnacion Slates, NP  chlordiazePOXIDE (LIBRIUM) 25 MG capsule Take 25 mg (1capsule) at bedtime on 12-01-14: For alcohol withdrawal symptoms. Take 25 mg (1 capsule) three times daily on 12-02-14: For alcohol withdrawal symptoms. Take 25 mg (1 capsule) twice daily on 12-03-13: For alcohol withdrawal symptoms. Take 25 mg (1 capsule) once daily on 12-04-14: For alcohol withdrawal symptoms. Disp: #7 capsules. 12/01/14  Encarnacion Slates, NP  hydrOXYzine (ATARAX/VISTARIL) 25 MG tablet Take 1 tablet (25 mg total) by mouth every 6 (six) hours as needed for anxiety. 12/01/14   Encarnacion Slates, NP  venlafaxine (EFFEXOR) 75 MG tablet Take 1 tablet (75 mg total) by mouth 2 (two) times daily. For depression 12/01/14   Encarnacion Slates, NP   BP 174/97 mmHg  Pulse 84  Temp(Src) 98.2 F (36.8 C) (Oral)  Resp 18  SpO2 97% Physical Exam  Constitutional: He is  oriented to person, place, and time. He appears well-developed and well-nourished. No distress.  HENT:  Head: Normocephalic and atraumatic.  Nose: Nose normal.  Mouth/Throat: Oropharynx is clear and moist. No oropharyngeal exudate.  Eyes: Conjunctivae and EOM are normal.  Neck: Normal range of motion. Neck supple. No JVD present.  Cardiovascular: Normal rate, regular rhythm, normal heart sounds and intact distal pulses.   Pulmonary/Chest: Effort normal and breath sounds normal. No respiratory distress.  Abdominal: Soft. He exhibits no distension. There is no tenderness. There is no rebound and no guarding.  Musculoskeletal: Normal range of motion.  Neurological: He is alert and oriented to person, place, and time. No cranial nerve deficit. He displays no seizure activity. GCS eye subscore is 4. GCS verbal subscore is 5. GCS motor subscore is 6.  HDS, AAOx4. PERRL, EOMI, TML, face sym. CN 2-12 grossly intact. 5/5 sym, no drift, SILT, normal gait. Poor coordination. Pt with generalized shaking.    Skin: Skin is warm and dry. No rash noted.  Psychiatric: He has a normal mood and affect.  Nursing note and vitals reviewed.     ED Course  Procedures (including critical care time) Labs Review Labs Reviewed - No data to display  Imaging Review No results found.   EKG Interpretation None      MDM   Final diagnoses:  None    TAYDEN NICHELSON is a 54 y.o. male with H&P as above. This pt is in nad, afvss, non-toxic appearing.  HA onset was slow, not quick or thunderclap, doubt ich.  Pt has no focal neuro sx, neuro exam is wnl, no visual disturbance, no dizziness/lightheadedness and HA is described as typical HA, doubt intracranial abnormality (aneurysm or mass) and vertebral artery or carotid artery dissection.  No infectious sx, no meningismus, afebrile, no ams, doubt meningitis.  No sinus ttp, doubt sinusitis.  There is nothing on hx or exam to give me c/f dental or ear etiology.  No  tenderness over temporal artery, doubt temporal arteritis.  No tearing or eye pain, doubt cluster HA.  Nothing in hx to concern me for CO poisoning.  No hyperesthesia or rash to concern me for zoster.  The pt received the following treatments:  Medications  magnesium sulfate IVPB 2 g 50 mL (not administered)  sodium chloride 0.9 % bolus 1,000 mL (1,000 mLs Intravenous New Bag/Given 12/27/14 1817)  prochlorperazine (COMPAZINE) injection 10 mg (10 mg Intravenous Given 12/27/14 1820)  diphenhydrAMINE (BENADRYL) injection 50 mg (50 mg Intravenous Given 12/27/14 1823)  dexamethasone (DECADRON) injection 10 mg (10 mg Intravenous Given 12/27/14 1818)   Shaking improved. HA same. Mag given.  Following treatment pt's symptoms improved  Clinical Impression: 1. Nonintractable headache     Disposition: Discharge  Condition: Good  I have discussed the results, Dx and Tx plan with the pt(& family if present). He/she/they expressed understanding and agree(s) with the plan. Discharge instructions discussed at great length. Strict return precautions discussed and pt &/or family  have verbalized understanding of the instructions. No further questions at time of discharge.    New Prescriptions   No medications on file    Follow Up: Lorayne Marek, MD [place] [address] [phone]   As needed  [address] [place]  [id number] [phone]  As needed  [address] [place] [address] [phone]  As needed  [place] [address] [id number] [place] [address] [phone]  If symptoms worsen   Pt seen in conjunction with Dr. Dorie Rank, MD  Kirstie Peri, [facility] Resident - PGY-2    Kirstie Peri, MD 12/27/14 2001

## 2014-12-27 NOTE — ED Notes (Signed)
Pt reporting medication reaction.  Sts they recently combined effexor and zoloft.  Sts he has been shaking for the past 3 days.  Pt with visible tremors in triage.  Also reporting migraine.  Family member concerned pt may have a tumor; sts family history of brain tumors.

## 2014-12-27 NOTE — Discharge Instructions (Signed)

## 2014-12-27 NOTE — ED Provider Notes (Signed)
Pt is having headache and tremors.  He recently had effexor and zoloft started.  Pt ahs history of migraine headaches ans seizures.  On exam, pt is alert oriented but tremulous, anxious.    Family is concerned that patient may have a brain tumor.  They are concerned about follow up because he does not have the money to pay for testing.    I explained to family he had a brain MRI one year ago, and a head CT one month ago that were normal.  Family feels that he needs a brain MIR with contrast.  A family member had a tumor that was only picked up with a brain MRI with contrast.  Will treat headache, check electrolytes.  Do not feel that brain MRI is necessary at this time.  I saw and evaluated the patient, reviewed the resident's note and I agree with the findings and plan.   Dorie Rank, MD 12/27/14 1800

## 2014-12-27 NOTE — ED Notes (Signed)
The pt looks better but  He reoports thaT HE FEELS NOI BETTER.  His jittering is now goine.  He still has a headache

## 2014-12-27 NOTE — ED Notes (Signed)
The [facility] is [facility] [place].  [employer]  admissiion in behavorial for alcohol abuse

## 2015-02-26 ENCOUNTER — Encounter: Payer: Self-pay | Admitting: Internal Medicine

## 2015-02-26 ENCOUNTER — Ambulatory Visit: Payer: Medicaid Other | Attending: Internal Medicine | Admitting: Internal Medicine

## 2015-02-26 VITALS — BP 135/90 | HR 86 | Temp 98.0°F | Resp 16

## 2015-02-26 DIAGNOSIS — I10 Essential (primary) hypertension: Secondary | ICD-10-CM | POA: Diagnosis not present

## 2015-02-26 DIAGNOSIS — Z808 Family history of malignant neoplasm of other organs or systems: Secondary | ICD-10-CM | POA: Insufficient documentation

## 2015-02-26 DIAGNOSIS — F17200 Nicotine dependence, unspecified, uncomplicated: Secondary | ICD-10-CM

## 2015-02-26 DIAGNOSIS — Z1211 Encounter for screening for malignant neoplasm of colon: Secondary | ICD-10-CM

## 2015-02-26 DIAGNOSIS — F1021 Alcohol dependence, in remission: Secondary | ICD-10-CM | POA: Insufficient documentation

## 2015-02-26 DIAGNOSIS — R21 Rash and other nonspecific skin eruption: Secondary | ICD-10-CM | POA: Insufficient documentation

## 2015-02-26 DIAGNOSIS — Z720 Tobacco use: Secondary | ICD-10-CM | POA: Insufficient documentation

## 2015-02-26 DIAGNOSIS — Z800 Family history of malignant neoplasm of digestive organs: Secondary | ICD-10-CM

## 2015-02-26 DIAGNOSIS — F1010 Alcohol abuse, uncomplicated: Secondary | ICD-10-CM

## 2015-02-26 DIAGNOSIS — F1011 Alcohol abuse, in remission: Secondary | ICD-10-CM

## 2015-02-26 NOTE — Progress Notes (Signed)
MRN: [id number] Name: Spencer Mendoza  Sex: male Age: 54 y.o. DOB: Jul 20, 1961  Allergies: Codeine  Chief Complaint  Patient presents with  . Follow-up    HPI: Patient is 54 y.o. male who history of alcohol abuse, seizure disorder, migraine headaches,patient was referred to neurologist but has not been able to make appointment, as per patient is in the process to get his new insurance and will require another referral in the future , currently denies any headache dizziness chest and shortness of breath, today is complaining of noticed a rash on his arms sometimes which is itchy, as per patient he has hydrocortisone cream but has not tried yet, patient currently denies drinking alcohol and is in the rehabilitation program.patient still smokes cigarettes I, have counseled patient to quit smoking, he also history of hypertension but today's blood pressure is 135/90, patient is counseled for low salt diet.  Past Medical History  Diagnosis Date  . Epilepsy   . Migraine   . Anxiety   . Depression     Past Surgical History  Procedure Laterality Date  . Knee arthroscopy Right   . Appendectomy        Medication List       This list is accurate as of: 02/26/15 12:45 PM.  Always use your most recent med list.               amitriptyline 25 MG tablet  Commonly known as:  ELAVIL  Take 1 tablet (25 mg total) by mouth at bedtime. For sleep     aspirin 81 MG EC tablet  Take 1 tablet (81 mg total) by mouth daily. (May purchase this medicine from over the counter at local pharmacy): For heart health     atenolol 50 MG tablet  Commonly known as:  TENORMIN  Take 1 tablet (50 mg total) by mouth 2 (two) times daily. For high blood pressure     carbamazepine 200 MG tablet  Commonly known as:  TEGRETOL  Take 1 tablet (200 mg total) by mouth 2 (two) times daily after a meal. For seizures     cetirizine 10 MG chewable tablet  Commonly known as:  ZYRTEC  Chew 10 mg by mouth daily.     chlordiazePOXIDE 25 MG capsule  Commonly known as:  LIBRIUM  - Take 25 mg (1capsule) at bedtime on 12-01-14: For alcohol withdrawal symptoms.  - Take 25 mg (1 capsule) three times daily on 12-02-14: For alcohol withdrawal symptoms.  - Take 25 mg (1 capsule) twice daily on 12-03-13: For alcohol withdrawal symptoms.  - Take 25 mg (1 capsule) once daily on 12-04-14: For alcohol withdrawal symptoms.  - Disp: #7 capsules.     hydrOXYzine 25 MG tablet  Commonly known as:  ATARAX/VISTARIL  Take 1 tablet (25 mg total) by mouth every 6 (six) hours as needed for anxiety.     sertraline 50 MG tablet  Commonly known as:  ZOLOFT  Take 50 mg by mouth at bedtime.     venlafaxine 75 MG tablet  Commonly known as:  EFFEXOR  Take 1 tablet (75 mg total) by mouth 2 (two) times daily. For depression        No orders of the defined types were placed in this encounter.    Immunization History  Administered Date(s) Administered  . Influenza,inj,Quad PF,36+ Mos 07/30/2013    Family History  Problem Relation Age of Onset  . Heart disease Mother   . Stroke Mother   .  Heart disease Father   . Hypertension Father     History  Substance Use Topics  . Smoking status: Current Every Day Smoker -- 1.00 packs/day for 35 years  . Smokeless tobacco: Not on file  . Alcohol Use: 12.0 oz/week    10 Cans of beer, 10 Shots of liquor per week     Comment: drinks 1/5th vodka and 10 beers daily    Review of Systems   As noted in HPI  Filed Vitals:   02/26/15 1012  BP: 135/90  Pulse: 86  Temp: 98 F (36.7 C)  Resp: 16    Physical Exam  Physical Exam  Constitutional: No distress.  Eyes: EOM are normal. Pupils are equal, round, and reactive to light.  Cardiovascular: Normal rate and regular rhythm.   Pulmonary/Chest: Breath sounds normal. No respiratory distress. He has no wheezes. He has no rales.  Musculoskeletal: He exhibits no edema.  Both arms dry skin minimal erythema     CBC      Component Value Date/Time   WBC 7.0 11/27/2014 2050   RBC 5.06 11/27/2014 2050   HGB 16.7 12/27/2014 1824   HCT 49.0 12/27/2014 1824   PLT 277 11/27/2014 2050   MCV 93.5 11/27/2014 2050   LYMPHSABS 3.4 11/20/2014 1904   MONOABS 0.7 11/20/2014 1904   EOSABS 0.2 11/20/2014 1904   BASOSABS 0.1 11/20/2014 1904    CMP     Component Value Date/Time   NA 138 12/27/2014 1824   K 4.5 12/27/2014 1824   CL 101 12/27/2014 1824   CO2 27 11/27/2014 2050   GLUCOSE 89 12/27/2014 1824   BUN 18 12/27/2014 1824   CREATININE 1.10 12/27/2014 1824   CALCIUM 9.8 11/27/2014 2050   PROT 8.2 11/27/2014 2050   ALBUMIN 4.9 11/27/2014 2050   AST 36 11/27/2014 2050   ALT 23 11/27/2014 2050   ALKPHOS 84 11/27/2014 2050   BILITOT 0.4 11/27/2014 2050   GFRNONAA >90 11/27/2014 2050   GFRAA >90 11/27/2014 2050    Lab Results  Component Value Date/Time   CHOL 174 12/05/2013 04:50 AM    Lab Results  Component Value Date/Time   HGBA1C 5.0 12/05/2013 04:50 AM    Lab Results  Component Value Date/Time   AST 36 11/27/2014 08:50 PM    Assessment and Plan  Essential hypertension - Plan:Patient will try diet and exercise, is given the handout for DASH diet, reevaluate on the next visit.  Smoking Counseled patient to quit smoking  Family history of colon cancer - Plan: Ambulatory referral to Gastroenterology  History of alcohol abuse Currently in the rehabilitation program  Rash and nonspecific skin eruption Patient will use hydrocortisone cream  Special screening for malignant neoplasms, colon - Plan: Ambulatory referral to Gastroenterology   Health Maintenance -Colonoscopy:referred to GI   Return in about 3 months (around 05/28/2015), or if symptoms worsen or fail to improve.   This note has been created with Surveyor, quantity. Any transcriptional errors are unintentional.    Lorayne Marek, MD

## 2015-02-26 NOTE — Progress Notes (Signed)
Patient here for follow up on his migranes Complains of rash on his arms that comes and goes

## 2015-03-13 ENCOUNTER — Ambulatory Visit: Payer: Self-pay | Attending: Internal Medicine

## 2015-03-13 DIAGNOSIS — I10 Essential (primary) hypertension: Secondary | ICD-10-CM

## 2015-03-13 LAB — COMPLETE METABOLIC PANEL WITH GFR
ALT: 11 U/L (ref 0–53)
AST: 17 U/L (ref 0–37)
Albumin: 4.1 g/dL (ref 3.5–5.2)
Alkaline Phosphatase: 72 U/L (ref 39–117)
BUN: 14 mg/dL (ref 6–23)
CO2: 25 mEq/L (ref 19–32)
Calcium: 10 mg/dL (ref 8.4–10.5)
Chloride: 103 mEq/L (ref 96–112)
Creat: 0.99 mg/dL (ref 0.50–1.35)
GFR, Est African American: >89 mL/min
GFR, Est Non African American: 85 mL/min
Glucose, Bld: 72 mg/dL (ref 70–99)
Potassium: 5.2 mEq/L (ref 3.5–5.3)
Sodium: 142 mEq/L (ref 135–145)
Total Bilirubin: 0.3 mg/dL (ref 0.2–1.2)
Total Protein: 6.7 g/dL (ref 6.0–8.3)

## 2015-03-14 ENCOUNTER — Telehealth: Payer: Self-pay

## 2015-03-14 NOTE — Telephone Encounter (Signed)
-----   Message from Lorayne Marek, MD sent at 03/14/2015  9:51 AM EDT ----- Call and let the Patient know that blood work is normal.

## 2015-03-14 NOTE — Telephone Encounter (Signed)
Patient is aware of his lab results 

## 2015-05-21 ENCOUNTER — Emergency Department (HOSPITAL_COMMUNITY)
Admission: EM | Admit: 2015-05-21 | Discharge: 2015-05-22 | Disposition: A | Discharge status: Other Acute Inpatient Hospital | Payer: Medicaid Other

## 2015-05-21 ENCOUNTER — Encounter (HOSPITAL_COMMUNITY): Payer: Self-pay

## 2015-05-21 DIAGNOSIS — Z79899 Other long term (current) drug therapy: Secondary | ICD-10-CM | POA: Insufficient documentation

## 2015-05-21 DIAGNOSIS — R1013 Epigastric pain: Secondary | ICD-10-CM

## 2015-05-21 DIAGNOSIS — F329 Major depressive disorder, single episode, unspecified: Secondary | ICD-10-CM | POA: Insufficient documentation

## 2015-05-21 DIAGNOSIS — F12188 Cannabis abuse with other cannabis-induced disorder: Secondary | ICD-10-CM | POA: Insufficient documentation

## 2015-05-21 DIAGNOSIS — G40909 Epilepsy, unspecified, not intractable, without status epilepticus: Secondary | ICD-10-CM | POA: Insufficient documentation

## 2015-05-21 DIAGNOSIS — F1314 Sedative, hypnotic or anxiolytic abuse with sedative, hypnotic or anxiolytic-induced mood disorder: Secondary | ICD-10-CM | POA: Diagnosis not present

## 2015-05-21 DIAGNOSIS — R112 Nausea with vomiting, unspecified: Secondary | ICD-10-CM | POA: Diagnosis present

## 2015-05-21 DIAGNOSIS — R45851 Suicidal ideations: Secondary | ICD-10-CM | POA: Diagnosis not present

## 2015-05-21 DIAGNOSIS — F10230 Alcohol dependence with withdrawal, uncomplicated: Secondary | ICD-10-CM | POA: Insufficient documentation

## 2015-05-21 DIAGNOSIS — Z87891 Personal history of nicotine dependence: Secondary | ICD-10-CM | POA: Insufficient documentation

## 2015-05-21 DIAGNOSIS — G43909 Migraine, unspecified, not intractable, without status migrainosus: Secondary | ICD-10-CM | POA: Insufficient documentation

## 2015-05-21 DIAGNOSIS — F1910 Other psychoactive substance abuse, uncomplicated: Secondary | ICD-10-CM

## 2015-05-21 DIAGNOSIS — F1410 Cocaine abuse, uncomplicated: Secondary | ICD-10-CM | POA: Diagnosis present

## 2015-05-21 DIAGNOSIS — F1114 Opioid abuse with opioid-induced mood disorder: Secondary | ICD-10-CM | POA: Diagnosis not present

## 2015-05-21 DIAGNOSIS — F1414 Cocaine abuse with cocaine-induced mood disorder: Secondary | ICD-10-CM | POA: Insufficient documentation

## 2015-05-21 DIAGNOSIS — K2970 Gastritis, unspecified, without bleeding: Secondary | ICD-10-CM | POA: Insufficient documentation

## 2015-05-21 DIAGNOSIS — F1994 Other psychoactive substance use, unspecified with psychoactive substance-induced mood disorder: Secondary | ICD-10-CM

## 2015-05-21 DIAGNOSIS — F419 Anxiety disorder, unspecified: Secondary | ICD-10-CM | POA: Diagnosis not present

## 2015-05-21 DIAGNOSIS — Z7982 Long term (current) use of aspirin: Secondary | ICD-10-CM | POA: Diagnosis not present

## 2015-05-21 DIAGNOSIS — F32A Depression, unspecified: Secondary | ICD-10-CM

## 2015-05-21 LAB — URINALYSIS, ROUTINE W REFLEX MICROSCOPIC
Glucose, UA: NEGATIVE mg/dL
Hgb urine dipstick: NEGATIVE
Ketones, ur: NEGATIVE mg/dL
Leukocytes, UA: NEGATIVE
Nitrite: NEGATIVE
Protein, ur: 30 mg/dL — AB
Specific Gravity, Urine: 1.026 (ref 1.005–1.030)
Urobilinogen, UA: 1 mg/dL (ref 0.0–1.0)
pH: 6 (ref 5.0–8.0)

## 2015-05-21 LAB — CBC WITH DIFFERENTIAL/PLATELET
Basophils Absolute: 0.1 10*3/uL (ref 0.0–0.1)
Basophils Relative: 1 % (ref 0–1)
Eosinophils Absolute: 0.1 10*3/uL (ref 0.0–0.7)
Eosinophils Relative: 1 % (ref 0–5)
HCT: 49.1 % (ref 39.0–52.0)
Hemoglobin: 17.3 g/dL — ABNORMAL HIGH (ref 13.0–17.0)
Lymphocytes Relative: 25 % (ref 12–46)
Lymphs Abs: 3.7 10*3/uL (ref 0.7–4.0)
MCH: 30.6 pg (ref 26.0–34.0)
MCHC: 35.2 g/dL (ref 30.0–36.0)
MCV: 86.9 fL (ref 78.0–100.0)
Monocytes Absolute: 1.5 10*3/uL — ABNORMAL HIGH (ref 0.1–1.0)
Monocytes Relative: 10 % (ref 3–12)
Neutro Abs: 9.5 10*3/uL — ABNORMAL HIGH (ref 1.7–7.7)
Neutrophils Relative %: 63 % (ref 43–77)
Platelets: 395 10*3/uL (ref 150–400)
RBC: 5.65 MIL/uL (ref 4.22–5.81)
RDW: 14.1 % (ref 11.5–15.5)
WBC: 14.8 10*3/uL — ABNORMAL HIGH (ref 4.0–10.5)

## 2015-05-21 LAB — COMPREHENSIVE METABOLIC PANEL
ALT: 20 U/L (ref 17–63)
AST: 26 U/L (ref 15–41)
Albumin: 4.9 g/dL (ref 3.5–5.0)
Alkaline Phosphatase: 99 U/L (ref 38–126)
Anion gap: 16 — ABNORMAL HIGH (ref 5–15)
BUN: 20 mg/dL (ref 6–20)
CO2: 21 mmol/L — ABNORMAL LOW (ref 22–32)
Calcium: 10.2 mg/dL (ref 8.9–10.3)
Chloride: 98 mmol/L — ABNORMAL LOW (ref 101–111)
Creatinine, Ser: 1.35 mg/dL — ABNORMAL HIGH (ref 0.61–1.24)
GFR calc Af Amer: >60 mL/min (ref >60)
GFR calc non Af Amer: 57 mL/min — ABNORMAL LOW (ref >60)
Glucose, Bld: 115 mg/dL — ABNORMAL HIGH (ref 65–99)
Potassium: 3.3 mmol/L — ABNORMAL LOW (ref 3.5–5.1)
Sodium: 135 mmol/L (ref 135–145)
Total Bilirubin: 1.4 mg/dL — ABNORMAL HIGH (ref 0.3–1.2)
Total Protein: 8.3 g/dL — ABNORMAL HIGH (ref 6.5–8.1)

## 2015-05-21 LAB — RAPID URINE DRUG SCREEN, HOSP PERFORMED
Amphetamines: NOT DETECTED
Barbiturates: NOT DETECTED
Benzodiazepines: POSITIVE — AB
Cocaine: POSITIVE — AB
Opiates: POSITIVE — AB
Tetrahydrocannabinol: POSITIVE — AB

## 2015-05-21 LAB — LIPASE, BLOOD: Lipase: 16 U/L — ABNORMAL LOW (ref 22–51)

## 2015-05-21 LAB — URINE MICROSCOPIC-ADD ON

## 2015-05-21 MED ORDER — ATENOLOL 50 MG PO TABS
50.0000 mg | ORAL_TABLET | Freq: Two times a day (BID) | ORAL | Status: DC
  Administered 2015-05-21 – 2015-05-22 (×2): 50 mg via ORAL
  Filled 2015-05-21 (×3): qty 1

## 2015-05-21 MED ORDER — PANTOPRAZOLE SODIUM 40 MG IV SOLR
40.0000 mg | Freq: Once | INTRAVENOUS | Status: AC
  Administered 2015-05-21: 40 mg via INTRAVENOUS
  Filled 2015-05-21: qty 40

## 2015-05-21 MED ORDER — SERTRALINE HCL 50 MG PO TABS
50.0000 mg | ORAL_TABLET | Freq: Every day | ORAL | Status: DC
  Administered 2015-05-21: 50 mg via ORAL
  Filled 2015-05-21: qty 1

## 2015-05-21 MED ORDER — HYDROMORPHONE HCL 1 MG/ML IJ SOLN
1.0000 mg | Freq: Once | INTRAMUSCULAR | Status: AC
  Administered 2015-05-21: 1 mg via INTRAVENOUS
  Filled 2015-05-21: qty 1

## 2015-05-21 MED ORDER — PROMETHAZINE HCL 25 MG/ML IJ SOLN
12.5000 mg | Freq: Once | INTRAMUSCULAR | Status: AC
  Administered 2015-05-21: 12.5 mg via INTRAVENOUS
  Filled 2015-05-21: qty 1

## 2015-05-21 MED ORDER — LORAZEPAM 1 MG PO TABS: 0.0000 mg | ORAL_TABLET | Freq: Two times a day (BID) | ORAL | Status: DC

## 2015-05-21 MED ORDER — VITAMIN B-1 100 MG PO TABS
100.0000 mg | ORAL_TABLET | Freq: Every day | ORAL | Status: DC
Start: 2015-05-21 — End: 2015-05-22
  Administered 2015-05-22: 100 mg via ORAL
  Filled 2015-05-21 (×2): qty 1

## 2015-05-21 MED ORDER — FAMOTIDINE 20 MG PO TABS: 20.0000 mg | ORAL_TABLET | Freq: Two times a day (BID) | ORAL | Status: DC

## 2015-05-21 MED ORDER — THIAMINE HCL 100 MG/ML IJ SOLN
100.0000 mg | Freq: Every day | INTRAMUSCULAR | Status: DC
  Filled 2015-05-21: qty 2

## 2015-05-21 MED ORDER — DIAZEPAM 5 MG/ML IJ SOLN
5.0000 mg | Freq: Once | INTRAMUSCULAR | Status: AC
  Administered 2015-05-21: 5 mg via INTRAVENOUS
  Filled 2015-05-21: qty 2

## 2015-05-21 MED ORDER — HYDROXYZINE HCL 25 MG PO TABS
25.0000 mg | ORAL_TABLET | Freq: Four times a day (QID) | ORAL | Status: DC | PRN
  Administered 2015-05-21: 25 mg via ORAL
  Filled 2015-05-21: qty 1

## 2015-05-21 MED ORDER — GI COCKTAIL ~~LOC~~
30.0000 mL | Freq: Once | ORAL | Status: AC
  Administered 2015-05-21: 30 mL via ORAL
  Filled 2015-05-21: qty 30

## 2015-05-21 MED ORDER — ALBUTEROL SULFATE HFA 108 (90 BASE) MCG/ACT IN AERS: 1.0000 | INHALATION_SPRAY | Freq: Four times a day (QID) | RESPIRATORY_TRACT | Status: DC | PRN

## 2015-05-21 MED ORDER — SODIUM CHLORIDE 0.9 % IV BOLUS (SEPSIS)
1000.0000 mL | Freq: Once | INTRAVENOUS | Status: AC
  Administered 2015-05-21: 1000 mL via INTRAVENOUS

## 2015-05-21 MED ORDER — VENLAFAXINE HCL 75 MG PO TABS
75.0000 mg | ORAL_TABLET | Freq: Two times a day (BID) | ORAL | Status: DC
  Administered 2015-05-21 – 2015-05-22 (×2): 75 mg via ORAL
  Filled 2015-05-21 (×3): qty 1

## 2015-05-21 MED ORDER — GABAPENTIN 400 MG PO CAPS
800.0000 mg | ORAL_CAPSULE | Freq: Three times a day (TID) | ORAL | Status: DC
  Administered 2015-05-21 – 2015-05-22 (×2): 800 mg via ORAL
  Filled 2015-05-21 (×3): qty 2

## 2015-05-21 MED ORDER — POTASSIUM CHLORIDE CRYS ER 20 MEQ PO TBCR
20.0000 meq | EXTENDED_RELEASE_TABLET | Freq: Once | ORAL | Status: AC
Start: 2015-05-21 — End: 2015-05-21
  Administered 2015-05-21: 20 meq via ORAL
  Filled 2015-05-21: qty 1

## 2015-05-21 MED ORDER — FAMOTIDINE IN NACL 20-0.9 MG/50ML-% IV SOLN
20.0000 mg | Freq: Once | INTRAVENOUS | Status: AC
  Administered 2015-05-21: 20 mg via INTRAVENOUS
  Filled 2015-05-21: qty 50

## 2015-05-21 MED ORDER — AMITRIPTYLINE HCL 25 MG PO TABS
25.0000 mg | ORAL_TABLET | Freq: Every day | ORAL | Status: DC
  Administered 2015-05-21: 50 mg via ORAL
  Filled 2015-05-21: qty 2

## 2015-05-21 MED ORDER — CARBAMAZEPINE 200 MG PO TABS
200.0000 mg | ORAL_TABLET | Freq: Two times a day (BID) | ORAL | Status: DC
  Administered 2015-05-21 – 2015-05-22 (×2): 200 mg via ORAL
  Filled 2015-05-21 (×4): qty 1

## 2015-05-21 MED ORDER — FAMOTIDINE 20 MG PO TABS
20.0000 mg | ORAL_TABLET | Freq: Two times a day (BID) | ORAL | Status: DC
  Administered 2015-05-21 – 2015-05-22 (×2): 20 mg via ORAL
  Filled 2015-05-21 (×2): qty 1

## 2015-05-21 MED ORDER — VITAMIN B-12 100 MCG PO TABS
100.0000 ug | ORAL_TABLET | Freq: Every day | ORAL | Status: DC
  Administered 2015-05-21 – 2015-05-22 (×2): 100 ug via ORAL
  Filled 2015-05-21 (×2): qty 1

## 2015-05-21 MED ORDER — LORAZEPAM 1 MG PO TABS
0.0000 mg | ORAL_TABLET | Freq: Four times a day (QID) | ORAL | Status: DC
  Administered 2015-05-21 – 2015-05-22 (×2): 1 mg via ORAL
  Filled 2015-05-21 (×2): qty 1

## 2015-05-21 MED ORDER — PANTOPRAZOLE SODIUM 40 MG PO TBEC
40.0000 mg | DELAYED_RELEASE_TABLET | Freq: Two times a day (BID) | ORAL | Status: DC
  Administered 2015-05-21 – 2015-05-22 (×2): 40 mg via ORAL
  Filled 2015-05-21 (×2): qty 1

## 2015-05-21 MED ORDER — LORATADINE 10 MG PO TABS
10.0000 mg | ORAL_TABLET | Freq: Every day | ORAL | Status: DC
  Administered 2015-05-21 – 2015-05-22 (×2): 10 mg via ORAL
  Filled 2015-05-21 (×2): qty 1

## 2015-05-21 MED ORDER — PANTOPRAZOLE SODIUM 40 MG PO TBEC: 40.0000 mg | DELAYED_RELEASE_TABLET | Freq: Two times a day (BID) | ORAL | Status: DC

## 2015-05-21 MED ORDER — TRAZODONE HCL 100 MG PO TABS
100.0000 mg | ORAL_TABLET | Freq: Once | ORAL | Status: AC
  Administered 2015-05-21: 100 mg via ORAL
  Filled 2015-05-21: qty 1

## 2015-05-21 NOTE — ED Notes (Signed)
Bed: [facility] Expected date:  Expected time:  Means of arrival:  Comments: Room 4

## 2015-05-21 NOTE — ED Notes (Signed)
Urine sample requested.  Patient provided with a urinal.

## 2015-05-21 NOTE — ED Notes (Signed)
Pt states smoke crack on Friday, states started having abdominal pain/burning, then past 2 days started vomiting blood and having blood in stool, pt states smoked weed to try and feel better along w/ drinking alcohol, but states feels no better.

## 2015-05-21 NOTE — ED Notes (Signed)
Pt has tried to urinate to give a sample but has been unsuccessful multiple times
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
## 2015-05-21 NOTE — ED Notes (Signed)
Report given to [facility] in [place].

## 2015-05-21 NOTE — ED Notes (Signed)
Pt states he cannot give urine sample at this time.

## 2015-05-21 NOTE — BH Assessment (Addendum)
Tele Assessment Note   GAIL CREEKMORE is an 54 y.o. male that was assessed this day via tele assessment after calling EDP Kohut to gather clinical information on the pt.  Pt presents to [facility] with his wife who was also present for assessment per pt request.  Pt reported that he smoked crack on Friday and has been vomiting blood.  Pt stated he relapsed on alcohol 4 or 5 weeks ago and has been drinking 8-12 beers per day, last drank Friday night, cannot remember amount.  Pt stated because he started vomiting, that he used crack and then used marijuana today to help with his nausea.  Pt reports he has SI and when asked about a plan he stated, "If I left and got hit by a car it would be ok."  Pt denies HI or AVH.  No delusions noted.  Pt stated he has followed up with [facility] for counseling and med mgnt since released from [facility] 11/2014. Pt stated he was sober until 4-5 weeks ago and that he cannot live like this.  Pt reports continued and worsening depression and anxiety including panic attacks.  Pt stated he takes his medications as prescribed.  Pt prescribed Amitriptyline, Neurontin, Vistaril and Zoloft.  Pt stated current withdrawal sx are nausea, tremor, headache, and pt has hx of blackouts.  Pt also has a seizure disorder by report and last seizure was 2 weeks ago and he has had two in the past month.  Pt cooperative, oriented x 4, has depressed mood, appropriate affect, fair eye contact, appears disheveled, has normal speech.  Inpatient psychiatric hospitalization is recommended for the pt at this time.  Consulted with EDP Lugo at [facility] who recommends inpatient treatment.  Updated Debarah Crape, [facility], TTS and ED staff.  Pt is voluntary and motivated to get help.  Updated EDP Ralene Bathe who was in agreement with pt disposition.  Axis I: 296.33 Major Depressive Disorder, Recurrent, Severe, 303.90 Alcohol Use Disorder, Severe, 305.20 Cannabis use disorder, Mild, 305.60 Cocaine use disorder, Mild Axis II: Deferred Axis  III:  Past Medical History  Diagnosis Date  . Epilepsy   . Migraine   . Anxiety   . Depression    Axis IV: economic problems, other psychosocial or environmental problems, problems with access to health care services and problems with primary support group Axis V: 21-30 behavior considerably influenced by delusions or hallucinations OR serious impairment in judgment, communication OR inability to function in almost all areas  Past Medical History:  Past Medical History  Diagnosis Date  . Epilepsy   . Migraine   . Anxiety   . Depression     Past Surgical History  Procedure Laterality Date  . Knee arthroscopy Right   . Appendectomy      Family History:  Family History  Problem Relation Age of Onset  . Heart disease Mother   . Stroke Mother   . Heart disease Father   . Hypertension Father     Social History:  reports that he has quit smoking. He has never used smokeless tobacco. He reports that he drinks about 4.8 oz of alcohol per week. He reports that he uses illicit drugs (Marijuana and "Crack" cocaine).  Additional Social History:  Alcohol / Drug Use Pain Medications: see med list Prescriptions: see med list Over the Counter: see med list History of alcohol / drug use?: Yes Longest period of sobriety (when/how long): several months 2016 Negative Consequences of Use: Personal relationships Withdrawal Symptoms: Patient aware  of relationship between substance abuse and physical/medical complications, Nausea / Vomiting, Tremors Substance #1 Name of Substance 1: Alcohol 1 - Age of First Use: 8 1 - Amount (size/oz): 8-12 beers 1 - Frequency: daily 1 - Duration: 4-5 weeks - relapsed 1 - Last Use / Amount: 05/17/15-unknown amount Substance #2 Name of Substance 2: Crack cocaine 2 - Age of First Use: unknown 2 - Amount (size/oz): $200 2 - Frequency: once since relapse 2 - Duration: ongoing 2 - Last Use / Amount: 05/17/15-$200 Substance #3 Name of Substance 3:  Marijuana 3 - Age of First Use: teens 3 - Amount (size/oz): pt did not specify 3 - Frequency: once since relapse 3 - Duration: ongoing 3 - Last Use / Amount: today-"I had one today"  CIWA: CIWA-Ar BP: 121/94 mmHg Pulse Rate: 98 Nausea and Vomiting: 3 Tactile Disturbances: none Tremor: not visible, but can be felt fingertip to fingertip Auditory Disturbances: not present Paroxysmal Sweats: no sweat visible Visual Disturbances: not present Anxiety: two Headache, Fullness in Head: moderate Agitation: normal activity Orientation and Clouding of Sensorium: oriented and can do serial additions CIWA-Ar Total: 9 COWS:    PATIENT STRENGTHS: (choose at least two) Ability for insight Average or above average intelligence Motivation for treatment/growth  Allergies:  Allergies  Allergen Reactions  . Codeine Nausea And Vomiting    Home Medications:  (Not in a hospital admission)  OB/GYN Status:  No LMP for male patient.  General Assessment Data Location of Assessment: [facility] ED TTS Assessment: In system Is this a Tele or Face-to-Face Assessment?: Tele Assessment Is this an Initial Assessment or a Re-assessment for this encounter?: Initial Assessment Marital status: Married [place] name:  (na) Is patient pregnant?:  (na) Pregnancy Status:  (na) Living Arrangements: Spouse/significant other, Parent Can pt return to current living arrangement?: Yes Admission Status: Voluntary Is patient capable of signing voluntary admission?: Yes Referral Source: Self/Family/Friend Insurance type: None  Medical Screening Exam ([place]) Medical Exam completed:  (na)  Crisis Care Plan Living Arrangements: Spouse/significant other, Parent Name of Psychiatrist: [facility] Name of Therapist: none  Education Status Is patient currently in school?: No Current Grade: na Highest grade of school patient has completed: GED Name of school: na Contact person: na  Risk to self with the past 6  months Suicidal Ideation: Yes-Currently Present Has patient been a risk to self within the past 6 months prior to admission? : Yes Suicidal Intent: No Has patient had any suicidal intent within the past 6 months prior to admission? : No Is patient at risk for suicide?: Yes Suicidal Plan?: Yes-Currently Present Has patient had any suicidal plan within the past 6 months prior to admission? : Yes Specify Current Suicidal Plan: "If I left and got hit by a car, it would be ok" Access to Means: Yes Specify Access to Suicidal Means: can walk out in front of a car What has been your use of drugs/alcohol within the last 12 months?: pt relapsed on alcohol, cocaine, marijuana Previous Attempts/Gestures: No How many times?: 0 Other Self Harm Risks: na-pt denies Triggers for Past Attempts: None known Intentional Self Injurious Behavior: None Family Suicide History: No Recent stressful life event(s): Financial Problems, Recent negative physical changes, Other (Comment) (SI with plan, depression, relapse, financial) Persecutory voices/beliefs?: No Depression: Yes Depression Symptoms: Despondent, Insomnia, Tearfulness, Loss of interest in usual pleasures, Feeling worthless/self pity, Feeling angry/irritable Substance abuse history and/or treatment for substance abuse?: Yes Suicide prevention information given to non-admitted patients: Not applicable  Risk to Others within the past 6 months Homicidal Ideation: No Does patient have any lifetime risk of violence toward others beyond the six months prior to admission? : No Thoughts of Harm to Others: No Current Homicidal Intent: No Current Homicidal Plan: No Access to Homicidal Means: No Identified Victim: na-pt denies History of harm to others?: No Assessment of Violence: None Noted Violent Behavior Description: na-pt cooperative Does patient have access to weapons?: No Criminal Charges Pending?: No Does patient have a court date: No Is patient  on probation?: No  Psychosis Hallucinations: None noted Delusions: None noted  Mental Status Report Appearance/Hygiene: Disheveled Eye Contact: Fair Motor Activity: Freedom of movement, Unremarkable Speech: Logical/coherent Level of Consciousness: Alert Mood: Depressed Affect: Appropriate to circumstance Anxiety Level: Panic Attacks Panic attack frequency: daily Most recent panic attack: today Thought Processes: Coherent, Relevant Judgement: Impaired Orientation: Person, Place, Time, Situation Obsessive Compulsive Thoughts/Behaviors: None  Cognitive Functioning Concentration: Decreased Memory: Recent Impaired, Remote Impaired IQ: Average Insight: Fair Impulse Control: Poor Appetite: Poor Weight Loss:  (pt stated he is sure he has lost weight since relapse) Weight Gain: 0 Sleep: Decreased Total Hours of Sleep:  (< 6 hrs per night) Vegetative Symptoms: None  ADLScreening [facility] Assessment Services) Patient's cognitive ability adequate to safely complete daily activities?: No Patient able to express need for assistance with ADLs?: Yes Independently performs ADLs?: Yes (appropriate for developmental age)  Prior Inpatient Therapy Prior Inpatient Therapy: Yes Prior Therapy Dates: 2016 and unknown date in past Prior Therapy Facilty/Provider(s): [facility] and unknown facility in past Reason for Treatment: SA  Prior Outpatient Therapy Prior Outpatient Therapy: Yes Prior Therapy Dates: Current Prior Therapy Facilty/Provider(s): [facility] Reason for Treatment: Med mgnt Does patient have an ACCT team?: No Does patient have Intensive In-House Services?  : No Does patient have [facility] services? : No Does patient have P4CC services?: No  ADL Screening (condition at time of admission) Patient's cognitive ability adequate to safely complete daily activities?: No Is the patient deaf or have difficulty hearing?: No Does the patient have difficulty seeing, even when wearing  glasses/contacts?: No Does the patient have difficulty concentrating, remembering, or making decisions?: No Patient able to express need for assistance with ADLs?: Yes Does the patient have difficulty dressing or bathing?: No Independently performs ADLs?: Yes (appropriate for developmental age) Does the patient have difficulty walking or climbing stairs?: No  Home Assistive Devices/Equipment Home Assistive Devices/Equipment: None    Abuse/Neglect Assessment (Assessment to be complete while patient is alone) Physical Abuse: Denies Verbal Abuse: Denies Sexual Abuse: Denies Exploitation of patient/patient's resources: Denies Self-Neglect: Denies Values / Beliefs Cultural Requests During Hospitalization: None Spiritual Requests During Hospitalization: None Consults Spiritual Care Consult Needed: No Social Work Consult Needed: No Regulatory affairs officer (For Healthcare) Does patient have an advance directive?: No Would patient like information on creating an advanced directive?: No - patient declined information    Additional Information 1:1 In Past 12 Months?: No CIRT Risk: No Elopement Risk: No Does patient have medical clearance?: Yes     Disposition:  Disposition Initial Assessment Completed for this Encounter: Yes Disposition of Patient: Referred to, Inpatient treatment program Type of inpatient treatment program: Adult  Shaune Pascal, MS, [facility] Therapeutic Triage Specialist [facility]   05/21/2015 5:07 PM

## 2015-05-21 NOTE — ED Notes (Signed)
Pt states he feels urge to urinate but is still unable to.  Pt will attempt again and if no results, is agreeable to have in/out cath.

## 2015-05-21 NOTE — ED Notes (Signed)
Patient is aware we are in need of his urine sample.

## 2015-05-21 NOTE — ED Provider Notes (Signed)
CSN: [id number]     Arrival date & time 05/21/15  1130 History   First MD Initiated Contact with Patient 05/21/15 1135     Chief Complaint  Patient presents with  . Emesis  . Abdominal Pain     (Consider location/radiation/quality/duration/timing/severity/associated sxs/prior Treatment) HPI   54yM with abdominal pain and n/v. Onset about 2-3 days ago. Epigastric. Constant. Does not radiate. Associated with n/v. Has had some streaks of blood in emesis. Patient abuses drugs and alcohol. Smokes crack cocaine 3 days ago. Has continued to smoke marijuana since symptom onset without improvement in her nausea. He drinks most days but has not had anything to drink since shortly after symptom onset because of the nausea and vomiting. No fevers or chills. No chest pain. No respiratory complaints. Patient does frequently take BC powder for pain has been using them multiple times a day over the past couple days.  Past Medical History  Diagnosis Date  . Epilepsy   . Migraine   . Anxiety   . Depression    Past Surgical History  Procedure Laterality Date  . Knee arthroscopy Right   . Appendectomy     Family History  Problem Relation Age of Onset  . Heart disease Mother   . Stroke Mother   . Heart disease Father   . Hypertension Father    History  Substance Use Topics  . Smoking status: Former Smoker -- 1.00 packs/day for 35 years  . Smokeless tobacco: Never Used  . Alcohol Use: 6.0 oz/week    10 Shots of liquor per week     Comment: drinks daily 6 pack a day    Review of Systems  All systems reviewed and negative, other than as noted in HPI.   Allergies  Codeine  Home Medications   Prior to Admission medications   Medication Sig Start Date End Date Taking? Authorizing Provider  albuterol (PROVENTIL HFA;VENTOLIN HFA) 108 (90 BASE) MCG/ACT inhaler Inhale 1 puff into the lungs every 6 (six) hours as needed for wheezing or shortness of breath.   Yes Historical Provider, MD   amitriptyline (ELAVIL) 25 MG tablet Take 1 tablet (25 mg total) by mouth at bedtime. For sleep Patient taking differently: Take 25-50 mg by mouth at bedtime. For sleep 12/01/14  Yes Encarnacion Slates, NP  Aspirin-Salicylamide-Caffeine (BC HEADACHE PO) Take 1 packet by mouth every 8 (eight) hours as needed (for pain).   Yes Historical Provider, MD  cetirizine (ZYRTEC) 10 MG chewable tablet Chew 10 mg by mouth daily.   Yes Historical Provider, MD  gabapentin (NEURONTIN) 800 MG tablet Take 800 mg by mouth 3 (three) times daily.   Yes Historical Provider, MD  hydrOXYzine (ATARAX/VISTARIL) 25 MG tablet Take 1 tablet (25 mg total) by mouth every 6 (six) hours as needed for anxiety. Patient taking differently: Take 50 mg by mouth every 6 (six) hours as needed for anxiety.  12/01/14  Yes Encarnacion Slates, NP  magnesium oxide (MAG-OX) 400 MG tablet Take 400 mg by mouth daily.   Yes Historical Provider, MD  sertraline (ZOLOFT) 50 MG tablet Take 50 mg by mouth at bedtime.   Yes Historical Provider, MD  vitamin B-12 (CYANOCOBALAMIN) 100 MCG tablet Take 100 mcg by mouth daily.   Yes Historical Provider, MD  vitamin C (ASCORBIC ACID) 500 MG tablet Take 500 mg by mouth daily.   Yes Historical Provider, MD  aspirin EC 81 MG EC tablet Take 1 tablet (81 mg total) by mouth daily. (  May purchase this medicine from over the counter at local pharmacy): For heart health Patient not taking: Reported on 12/27/2014 12/01/14   Encarnacion Slates, NP  atenolol (TENORMIN) 50 MG tablet Take 1 tablet (50 mg total) by mouth 2 (two) times daily. For high blood pressure Patient not taking: Reported on 12/27/2014 12/01/14   Encarnacion Slates, NP  carbamazepine (TEGRETOL) 200 MG tablet Take 1 tablet (200 mg total) by mouth 2 (two) times daily after a meal. For seizures Patient not taking: Reported on 12/27/2014 12/01/14   Encarnacion Slates, NP  chlordiazePOXIDE (LIBRIUM) 25 MG capsule Take 25 mg (1capsule) at bedtime on 12-01-14: For alcohol withdrawal  symptoms. Take 25 mg (1 capsule) three times daily on 12-02-14: For alcohol withdrawal symptoms. Take 25 mg (1 capsule) twice daily on 12-03-13: For alcohol withdrawal symptoms. Take 25 mg (1 capsule) once daily on 12-04-14: For alcohol withdrawal symptoms. Disp: #7 capsules. Patient not taking: Reported on 12/27/2014 12/01/14   Encarnacion Slates, NP  venlafaxine (EFFEXOR) 75 MG tablet Take 1 tablet (75 mg total) by mouth 2 (two) times daily. For depression Patient not taking: Reported on 05/21/2015 12/01/14   Encarnacion Slates, NP   BP 116/78 mmHg  Pulse 96  Temp(Src) 98.5 F (36.9 C) (Oral)  Resp 22  Ht 6' (1.829 m)  Wt 180 lb (81.647 kg)  BMI 24.41 kg/m2  SpO2 96% Physical Exam  Constitutional: He appears well-developed and well-nourished. No distress.  HENT:  Head: Normocephalic and atraumatic.  Eyes: Conjunctivae are normal. Right eye exhibits no discharge. Left eye exhibits no discharge.  Neck: Neck supple.  Cardiovascular: Regular rhythm and normal heart sounds.  Exam reveals no gallop and no friction rub.   No murmur heard. Mild tachycardia  Pulmonary/Chest: Effort normal and breath sounds normal. No respiratory distress.  Abdominal: Soft. He exhibits no distension. There is tenderness. There is no rebound.  Epigastric tenderness without rebound or guarding. No distention.  Musculoskeletal: He exhibits no edema or tenderness.  Neurological: He is alert.  Skin: Skin is warm and dry.  Psychiatric: His behavior is normal. Thought content normal.  Anxious. Speech is clear. Content appropriate. Follows commands.  Nursing note and vitals reviewed.   ED Course  Procedures (including critical care time) Labs Review Labs Reviewed  CBC WITH DIFFERENTIAL/PLATELET - Abnormal; Notable for the following:    WBC 14.8 (*)    Hemoglobin 17.3 (*)    Neutro Abs 9.5 (*)    Monocytes Absolute 1.5 (*)    All other components within normal limits  COMPREHENSIVE METABOLIC PANEL - Abnormal; Notable  for the following:    Potassium 3.3 (*)    Chloride 98 (*)    CO2 21 (*)    Glucose, Bld 115 (*)    Creatinine, Ser 1.35 (*)    Total Protein 8.3 (*)    Total Bilirubin 1.4 (*)    GFR calc non Af Amer 57 (*)    Anion gap 16 (*)    All other components within normal limits  LIPASE, BLOOD - Abnormal; Notable for the following:    Lipase 16 (*)    All other components within normal limits  URINALYSIS, ROUTINE W REFLEX MICROSCOPIC (NOT AT [facility])  URINE RAPID DRUG SCREEN, HOSP PERFORMED    Imaging Review No results found.   EKG Interpretation None      MDM   Final diagnoses:  Epigastric pain  Gastritis  Depression  Substance abuse   54y when  the male with epigastric pain and nausea vomiting. Suspect gastritis. History of alcohol abuse and has been using BC powder frequently over the past several days. Describes some hematemesis which sounds fairly minimal. No blood thinners. He is actually polycythemic. Doubt significant blood loss. No diagnosed history of varices. No blood noted while in the emergency room. Consider pancreatitis with history of alcohol abuse. Lipase is normal. Very atypical for ACS. Doubt emergent process. Will place on PPI. Needs to avoid NSAIDs and abstain from drinking.  Pt also requesting help for substance abuse and increasing depression. Cites family relationship stressors, particularly with his nephew but declines to discuss further. Denies SI but feelings of hopelessness.      Virgel Manifold, MD 05/22/15 [phone]

## 2015-05-21 NOTE — ED Notes (Addendum)
Patient reports that he has been vomiting bright red blood since he smoked crack 3 days ago. Patient also reports that he has been drinking alcohol, but continues to vomit. Patient also smoked marijuana prior to coming to the ED saying he hoped it would help his nausea. Patient's wife reports that the patient has been taking a lot of BC powders.

## 2015-05-21 NOTE — BH Assessment (Signed)
Inpt recommended. TTS seeking placement. Sent referrals to: Bobette Mo, [place], Wayna Chalet, [place], [place], [place] Triage Specialist 05/21/2015 8:08 PM

## 2015-05-21 NOTE — Progress Notes (Signed)
Last office [facility] visit was 4/11/6 Dr Annitta Needs

## 2015-05-21 NOTE — ED Notes (Signed)
Patient arrived to unit; no s/s of distress noted. Patient requests sleep aid and complains of anxiety. Patient denies SI or plans to harm himself.

## 2015-05-22 DIAGNOSIS — F1410 Cocaine abuse, uncomplicated: Secondary | ICD-10-CM | POA: Diagnosis present

## 2015-05-22 DIAGNOSIS — R45851 Suicidal ideations: Secondary | ICD-10-CM | POA: Diagnosis not present

## 2015-05-22 DIAGNOSIS — F1994 Other psychoactive substance use, unspecified with psychoactive substance-induced mood disorder: Secondary | ICD-10-CM | POA: Diagnosis not present

## 2015-05-22 HISTORY — DX: Cocaine abuse, uncomplicated: F14.10

## 2015-05-22 NOTE — ED Notes (Signed)
Patient c/o abd pain.  Denies opiate and cocaine use.  POC cont and patient and family support given.

## 2015-05-22 NOTE — Progress Notes (Addendum)
Pt last seen at [facility] in April 2016 per EPIC Pt tells CM he can not afford this pcp

## 2015-05-22 NOTE — BH Assessment (Signed)
[place] Assessment Progress Note  Phoebe from [facility] has called to report that this pt has been accepted to their facility by Oliver Hum, MD.  The bed is not yet available; they will call when they are ready for pt to be transferred.  Please call report to [phone]; ask for the charge nurse.  Waylan Boga, NP, concurs with this decision.  Pt is under voluntary status; he agrees to being transferred and will sign consent for admission upon arrival.  Pt's nurse, Luann, has been notified and agrees to call report.  Pt will be transported via Stacey Drain, [place] Triage Specialist [phone]

## 2015-05-22 NOTE — BH Assessment (Signed)
[place] Assessment Progress Note  At 14:08 Marlou Sa from the [facility] called. Pt has been assigned to Rm [id number], and they are ready to receive him.  Pt's nurse, Otho Perl, has been notified. ] Jalene Mullet, [place] Triage Specialist [phone]

## 2015-05-22 NOTE — Progress Notes (Signed)
CM spoke with pt who confirms self pay [place] resident with no pcp.  CM discussed and provided written information for self pay pcps, discussed the importance of pcp vs EDP services for f/u care, www.needymeds.org, www.goodrx.com, discounted pharmacies and other [place] resources such as [facility] , [facility], affordable care act,  [facility] med assist, financial assistance, self pay dental services, [place] med assist, DSS and  health department  Reviewed resources for [place] self pay pcps like [facility], [facility] [address], [facility], [facility], local urgent care centers, [facility], [facility], general [facility] clinics, [facility], [facility] plus others, medication resources, [facility] out patient pharmacies and housing Pt voiced understanding and appreciation of resources provided   Provided [facility] contact information Pt agreed to a referral Cm completed referral Pt to be contact by [facility] clinical liason 

## 2015-05-22 NOTE — ED Notes (Signed)
Offered 12 step information to client.

## 2015-05-22 NOTE — Consult Note (Signed)
[facility] Consult   Reason for Consult:  Suicidal ideations Referring Physician:  EDP Patient Identification: HERRICK HARTOG MRN:  [id number] Principal Diagnosis: Substance induced mood disorder Diagnosis:   Patient Active Problem List   Diagnosis Date Noted  . Cocaine abuse [F14.10] 05/22/2015    Priority: High  . Substance induced mood disorder [F19.94] 05/22/2015    Priority: High  . Alcohol dependence with uncomplicated withdrawal [A25.053]     Priority: High  . Headache, migraine [G43.909]   . Migraines [G43.909] 11/29/2014  . Headache(784.0) [R51] 08/08/2014  . Seizures [R56.9] 01/09/2014  . Chronic low back pain [M54.5, G89.29] 12/28/2013  . Hemiplegia, unspecified, affecting dominant side [G81.90] 12/06/2013  . CVA (cerebral infarction) [I63.9] 12/04/2013  . Leukocytosis [D72.829] 12/04/2013  . Acute Gastroenteritis [K52.9] 12/04/2013  . Hypertension [I10] 12/04/2013  . Hemiparesis [G81.90] 12/04/2013  . Delirium tremens [F10.231] 07/29/2013  . Alcohol withdrawal [F10.239] 07/29/2013  . UGIB (upper gastrointestinal bleed) [K92.2] 07/29/2013  . Chest pain [R07.9] 07/28/2013    Total Time spent with patient: 45 minutes  Subjective:   RYLE BUSCEMI is a 54 y.o. male patient admitted with suicidal ideations.  HPI:  The patient had been sober since February but started drinking gradually last month, drinks a six pack daily.  On Friday, he used cocaine and started feeling nauseous.  He came to the ED with GI discomfort and complaining he had been vomiting blood but his Hgb has increased from his last ED visit.  Slightly above the norm at this time, lipase below the norm.  Kolsen is married and living with his elderly father as his caregiver.  This has been very stressful on him and his wife.  His nephew recently went to prison for life (most likely) and his father has become belligerent and volatile.  Denies homicidal ideations and hallucinations.  HPI  Elements:   Location:  generalized. Quality:  acute. Severity:  severe. Timing:  constant. Duration:  few days. Context:  stressors.  Past Medical History:  Past Medical History  Diagnosis Date  . Epilepsy   . Migraine   . Anxiety   . Depression     Past Surgical History  Procedure Laterality Date  . Knee arthroscopy Right   . Appendectomy     Family History:  Family History  Problem Relation Age of Onset  . Heart disease Mother   . Stroke Mother   . Heart disease Father   . Hypertension Father    Social History:  History  Alcohol Use  . 4.8 oz/week  . 8 Cans of beer per week    Comment: drinks daily 6 pack a day     History  Drug Use  . Yes  . Special: Marijuana, "Crack" cocaine    Comment: crack use    History   Social History  . Marital Status: Divorced    Spouse Name: N/A  . Number of Children: N/A  . Years of Education: N/A   Occupational History  . Operates heavy machinery    Social History Main Topics  . Smoking status: Former Smoker -- 1.00 packs/day for 35 years  . Smokeless tobacco: Never Used  . Alcohol Use: 4.8 oz/week    8 Cans of beer per week     Comment: drinks daily 6 pack a day  . Drug Use: Yes    Special: Marijuana, "Crack" cocaine     Comment: crack use  . Sexual Activity: No   Other Topics Concern  .  None   Social History Narrative   Pt lives with significant other.    Additional Social History:    Pain Medications: see med list Prescriptions: see med list Over the Counter: see med list History of alcohol / drug use?: Yes Longest period of sobriety (when/how long): several months 2016 Negative Consequences of Use: Personal relationships Withdrawal Symptoms: Patient aware of relationship between substance abuse and physical/medical complications, Nausea / Vomiting, Tremors Name of Substance 1: Alcohol 1 - Age of First Use: 8 1 - Amount (size/oz): 8-12 beers 1 - Frequency: daily 1 - Duration: 4-5 weeks - relapsed 1  - Last Use / Amount: 05/17/15-unknown amount Name of Substance 2: Crack cocaine 2 - Age of First Use: unknown 2 - Amount (size/oz): $200 2 - Frequency: once since relapse 2 - Duration: ongoing 2 - Last Use / Amount: 05/17/15-$200 Name of Substance 3: Marijuana 3 - Age of First Use: teens 3 - Amount (size/oz): pt did not specify 3 - Frequency: once since relapse 3 - Duration: ongoing 3 - Last Use / Amount: today-"I had one today"               Allergies:   Allergies  Allergen Reactions  . Codeine Nausea And Vomiting    Labs:  Results for orders placed or performed during the hospital encounter of 05/21/15 (from the past 48 hour(s))  CBC with Differential     Status: Abnormal   Collection Time: 05/21/15 12:10 PM  Result Value Ref Range   WBC 14.8 (H) 4.0 - 10.5 K/uL   RBC 5.65 4.22 - 5.81 MIL/uL   Hemoglobin 17.3 (H) 13.0 - 17.0 g/dL   HCT 49.1 39.0 - 52.0 %   MCV 86.9 78.0 - 100.0 fL   MCH 30.6 26.0 - 34.0 pg   MCHC 35.2 30.0 - 36.0 g/dL   RDW 14.1 11.5 - 15.5 %   Platelets 395 150 - 400 K/uL   Neutrophils Relative % 63 43 - 77 %   Neutro Abs 9.5 (H) 1.7 - 7.7 K/uL   Lymphocytes Relative 25 12 - 46 %   Lymphs Abs 3.7 0.7 - 4.0 K/uL   Monocytes Relative 10 3 - 12 %   Monocytes Absolute 1.5 (H) 0.1 - 1.0 K/uL   Eosinophils Relative 1 0 - 5 %   Eosinophils Absolute 0.1 0.0 - 0.7 K/uL   Basophils Relative 1 0 - 1 %   Basophils Absolute 0.1 0.0 - 0.1 K/uL  Comprehensive metabolic panel     Status: Abnormal   Collection Time: 05/21/15 12:10 PM  Result Value Ref Range   Sodium 135 135 - 145 mmol/L   Potassium 3.3 (L) 3.5 - 5.1 mmol/L   Chloride 98 (L) 101 - 111 mmol/L   CO2 21 (L) 22 - 32 mmol/L   Glucose, Bld 115 (H) 65 - 99 mg/dL   BUN 20 6 - 20 mg/dL   Creatinine, Ser 1.35 (H) 0.61 - 1.24 mg/dL   Calcium 10.2 8.9 - 10.3 mg/dL   Total Protein 8.3 (H) 6.5 - 8.1 g/dL   Albumin 4.9 3.5 - 5.0 g/dL   AST 26 15 - 41 U/L   ALT 20 17 - 63 U/L   Alkaline Phosphatase 99  38 - 126 U/L   Total Bilirubin 1.4 (H) 0.3 - 1.2 mg/dL   GFR calc non Af Amer 57 (L) >60 mL/min   GFR calc Af Amer >60 >60 mL/min    Comment: (NOTE) The eGFR  has been calculated using the CKD EPI equation. This calculation has not been validated in all clinical situations. eGFR's persistently <60 mL/min signify possible Chronic Kidney Disease.    Anion gap 16 (H) 5 - 15  Lipase, blood     Status: Abnormal   Collection Time: 05/21/15 12:10 PM  Result Value Ref Range   Lipase 16 (L) 22 - 51 U/L  Urinalysis, Routine w reflex microscopic (not at [facility])     Status: Abnormal   Collection Time: 05/21/15  6:01 PM  Result Value Ref Range   Color, Urine AMBER (A) YELLOW    Comment: BIOCHEMICALS MAY BE AFFECTED BY COLOR   APPearance CLOUDY (A) CLEAR   Specific Gravity, Urine 1.026 1.005 - 1.030   pH 6.0 5.0 - 8.0   Glucose, UA NEGATIVE NEGATIVE mg/dL   Hgb urine dipstick NEGATIVE NEGATIVE   Bilirubin Urine MODERATE (A) NEGATIVE   Ketones, ur NEGATIVE NEGATIVE mg/dL   Protein, ur 30 (A) NEGATIVE mg/dL   Urobilinogen, UA 1.0 0.0 - 1.0 mg/dL   Nitrite NEGATIVE NEGATIVE   Leukocytes, UA NEGATIVE NEGATIVE  Urine rapid drug screen (hosp performed)     Status: Abnormal   Collection Time: 05/21/15  6:01 PM  Result Value Ref Range   Opiates POSITIVE (A) NONE DETECTED   Cocaine POSITIVE (A) NONE DETECTED   Benzodiazepines POSITIVE (A) NONE DETECTED   Amphetamines NONE DETECTED NONE DETECTED   Tetrahydrocannabinol POSITIVE (A) NONE DETECTED   Barbiturates NONE DETECTED NONE DETECTED    Comment:        DRUG SCREEN FOR MEDICAL PURPOSES ONLY.  IF CONFIRMATION IS NEEDED FOR ANY PURPOSE, NOTIFY LAB WITHIN 5 DAYS.        LOWEST DETECTABLE LIMITS FOR URINE DRUG SCREEN Drug Class       Cutoff (ng/mL) Amphetamine      1000 Barbiturate      200 Benzodiazepine   604 Tricyclics       540 Opiates          300 Cocaine          300 THC              50   Urine microscopic-add on     Status:  Abnormal   Collection Time: 05/21/15  6:01 PM  Result Value Ref Range   Squamous Epithelial / LPF RARE RARE   WBC, UA 0-2 <3 WBC/hpf   Casts HYALINE CASTS (A) NEGATIVE    Comment: GRANULAR CAST   Urine-Other MUCOUS PRESENT     Vitals: Blood pressure 99/62, pulse 65, temperature 98.2 F (36.8 C), temperature source Oral, resp. rate 16, height 6' (1.829 m), weight 81.647 kg (180 lb), SpO2 98 %.  Risk to Self: Suicidal Ideation: Yes-Currently Present Suicidal Intent: No Is patient at risk for suicide?: Yes Suicidal Plan?: Yes-Currently Present Specify Current Suicidal Plan: "If I left and got hit by a car, it would be ok" Access to Means: Yes Specify Access to Suicidal Means: can walk out in front of a car What has been your use of drugs/alcohol within the last 12 months?: pt relapsed on alcohol, cocaine, marijuana How many times?: 0 Other Self Harm Risks: na-pt denies Triggers for Past Attempts: None known Intentional Self Injurious Behavior: None Risk to Others: Homicidal Ideation: No Thoughts of Harm to Others: No Current Homicidal Intent: No Current Homicidal Plan: No Access to Homicidal Means: No Identified Victim: na-pt denies History of harm to others?: No Assessment of Violence: None  Noted Violent Behavior Description: na-pt cooperative Does patient have access to weapons?: No Criminal Charges Pending?: No Does patient have a court date: No Prior Inpatient Therapy: Prior Inpatient Therapy: Yes Prior Therapy Dates: 2016 and unknown date in past Prior Therapy Facilty/Provider(s): [facility] and unknown facility in past Reason for Treatment: SA Prior Outpatient Therapy: Prior Outpatient Therapy: Yes Prior Therapy Dates: Current Prior Therapy Facilty/Provider(s): [facility] Reason for Treatment: Med mgnt Does patient have an ACCT team?: No Does patient have Intensive In-House Services?  : No Does patient have [facility] services? : No Does patient have P4CC services?:  No  Current Facility-Administered Medications  Medication Dose Route Frequency Provider Last Rate Last Dose  . albuterol (PROVENTIL HFA;VENTOLIN HFA) 108 (90 BASE) MCG/ACT inhaler 1 puff  1 puff Inhalation Q6H PRN Virgel Manifold, MD      . amitriptyline (ELAVIL) tablet 25-50 mg  25-50 mg Oral QHS Virgel Manifold, MD   50 mg at 05/21/15 2159  . atenolol (TENORMIN) tablet 50 mg  50 mg Oral BID Virgel Manifold, MD   50 mg at 05/22/15 0950  . carbamazepine (TEGRETOL) tablet 200 mg  200 mg Oral BID PC Virgel Manifold, MD   200 mg at 05/22/15 0950  . famotidine (PEPCID) tablet 20 mg  20 mg Oral BID Virgel Manifold, MD   20 mg at 05/22/15 0950  . gabapentin (NEURONTIN) capsule 800 mg  800 mg Oral TID Virgel Manifold, MD   800 mg at 05/22/15 0950  . hydrOXYzine (ATARAX/VISTARIL) tablet 25 mg  25 mg Oral Q6H PRN Virgel Manifold, MD   25 mg at 05/21/15 2159  . loratadine (CLARITIN) tablet 10 mg  10 mg Oral Daily Virgel Manifold, MD   10 mg at 05/22/15 0950  . LORazepam (ATIVAN) tablet 0-4 mg  0-4 mg Oral 4 times per day Virgel Manifold, MD   1 mg at 05/21/15 2126   Followed by  . [START ON 05/23/2015] LORazepam (ATIVAN) tablet 0-4 mg  0-4 mg Oral Q12H Virgel Manifold, MD      . pantoprazole (PROTONIX) EC tablet 40 mg  40 mg Oral BID Virgel Manifold, MD   40 mg at 05/22/15 0950  . sertraline (ZOLOFT) tablet 50 mg  50 mg Oral QHS Virgel Manifold, MD   50 mg at 05/21/15 2159  . thiamine (VITAMIN B-1) tablet 100 mg  100 mg Oral Daily Virgel Manifold, MD   100 mg at 05/22/15 7846   Or  . thiamine (B-1) injection 100 mg  100 mg Intravenous Daily Virgel Manifold, MD      . venlafaxine [facility]) tablet 75 mg  75 mg Oral BID Virgel Manifold, MD   75 mg at 05/22/15 0950  . vitamin B-12 (CYANOCOBALAMIN) tablet 100 mcg  100 mcg Oral Daily Virgel Manifold, MD   100 mcg at 05/22/15 9629   Current Outpatient Prescriptions  Medication Sig Dispense Refill  . albuterol (PROVENTIL HFA;VENTOLIN HFA) 108 (90 BASE) MCG/ACT inhaler Inhale 1 puff into the  lungs every 6 (six) hours as needed for wheezing or shortness of breath.    [name] amitriptyline (ELAVIL) 25 MG tablet Take 1 tablet (25 mg total) by mouth at bedtime. For sleep (Patient taking differently: Take 25-50 mg by mouth at bedtime. For sleep) 30 tablet 0  . Aspirin-Salicylamide-Caffeine (BC HEADACHE PO) Take 1 packet by mouth every 8 (eight) hours as needed (for pain).    . cetirizine (ZYRTEC) 10 MG chewable tablet Chew 10 mg by mouth daily.    [name]  gabapentin (NEURONTIN) 800 MG tablet Take 800 mg by mouth 3 (three) times daily.    . hydrOXYzine (ATARAX/VISTARIL) 25 MG tablet Take 1 tablet (25 mg total) by mouth every 6 (six) hours as needed for anxiety. (Patient taking differently: Take 50 mg by mouth every 6 (six) hours as needed for anxiety. ) 30 tablet 0  . magnesium oxide (MAG-OX) 400 MG tablet Take 400 mg by mouth daily.    . sertraline (ZOLOFT) 50 MG tablet Take 50 mg by mouth at bedtime.    . vitamin B-12 (CYANOCOBALAMIN) 100 MCG tablet Take 100 mcg by mouth daily.    . vitamin C (ASCORBIC ACID) 500 MG tablet Take 500 mg by mouth daily.    [name] aspirin EC 81 MG EC tablet Take 1 tablet (81 mg total) by mouth daily. (May purchase this medicine from over the counter at local pharmacy): For heart health (Patient not taking: Reported on 12/27/2014) 30 tablet 0  . atenolol (TENORMIN) 50 MG tablet Take 1 tablet (50 mg total) by mouth 2 (two) times daily. For high blood pressure (Patient not taking: Reported on 12/27/2014) 30 tablet 0  . carbamazepine (TEGRETOL) 200 MG tablet Take 1 tablet (200 mg total) by mouth 2 (two) times daily after a meal. For seizures (Patient not taking: Reported on 12/27/2014) 60 tablet 0  . chlordiazePOXIDE (LIBRIUM) 25 MG capsule Take 25 mg (1capsule) at bedtime on 12-01-14: For alcohol withdrawal symptoms. Take 25 mg (1 capsule) three times daily on 12-02-14: For alcohol withdrawal symptoms. Take 25 mg (1 capsule) twice daily on 12-03-13: For alcohol withdrawal  symptoms. Take 25 mg (1 capsule) once daily on 12-04-14: For alcohol withdrawal symptoms. Disp: #7 capsules. (Patient not taking: Reported on 12/27/2014) 7 capsule 0  . famotidine (PEPCID) 20 MG tablet Take 1 tablet (20 mg total) by mouth 2 (two) times daily. 20 tablet 0  . pantoprazole (PROTONIX) 40 MG tablet Take 1 tablet (40 mg total) by mouth 2 (two) times daily. 60 tablet 0  . venlafaxine (EFFEXOR) 75 MG tablet Take 1 tablet (75 mg total) by mouth 2 (two) times daily. For depression (Patient not taking: Reported on 05/21/2015) 60 tablet 30    Musculoskeletal: Strength & Muscle Tone: within normal limits Gait & Station: normal Patient leans: N/A  Psychiatric Specialty Exam: Physical Exam  Review of Systems  Constitutional: Negative.   HENT: Negative.   Eyes: Negative.   Respiratory: Negative.   Cardiovascular: Negative.   Gastrointestinal: Negative.   Genitourinary: Negative.   Musculoskeletal: Negative.   Skin: Negative.   Neurological: Negative.   Endo/Heme/Allergies: Negative.   Psychiatric/Behavioral: Positive for depression and substance abuse. The patient is nervous/anxious.     Blood pressure 99/62, pulse 65, temperature 98.2 F (36.8 C), temperature source Oral, resp. rate 16, height 6' (1.829 m), weight 81.647 kg (180 lb), SpO2 98 %.Body mass index is 24.41 kg/(m^2).  General Appearance: Casual  Eye Contact::  Good  Speech:  Normal Rate  Volume:  Normal  Mood:  Anxious and Depressed  Affect:  Congruent  Thought Process:  Coherent  Orientation:  Full (Time, Place, and Person)  Thought Content:  Rumination  Suicidal Thoughts:  No  Homicidal Thoughts:  No  Memory:  Immediate;   Good Recent;   Good Remote;   Good  Judgement:  Impaired  Insight:  Fair  Psychomotor Activity:  Decreased  Concentration:  Fair  Recall:  [place] of Knowledge:Fair  Language: Good  Akathisia:  No  Handed:  Right  AIMS (if indicated):     Assets:  Housing Leisure Time Physical  Health Resilience Social Support  ADL's:  Intact  Cognition: WNL  Sleep:      Medical Decision Making: Review of Psycho-Social Stressors (1), Review or order clinical lab tests (1) and Review of Medication Regimen & Side Effects (2)  Treatment Plan Summary: Daily contact with patient to assess and evaluate symptoms and progress in treatment, Medication management and Plan Admit to [facility] for stabilization  Plan:  Recommend psychiatric Inpatient admission when medically cleared. Disposition: Admit to [facility] for stabilzation  Waylan Boga, [place] 05/22/2015 10:07 AM

## 2015-05-22 NOTE — BH Assessment (Signed)
[facility] Assessment Progress Note  Per Donnelly Angelica, MD, this pt does not require psychiatric hospitalization at this time.  He is to be discharged from [facility].  He asks this Probation officer to check with the pt to see if he needs outpatient resources.  This Probation officer spoke to the pt to ask if he needs referrals for psychiatry, therapy, or substance abuse treatment.  He reports that he has all of these services in place in the community and does not need referrals.  Pt's nurse, Otho Perl, has been notified.  Jalene Mullet, [place] Triage Specialist [phone]

## 2015-05-31 ENCOUNTER — Encounter: Payer: Self-pay | Admitting: Internal Medicine

## 2015-06-05 ENCOUNTER — Ambulatory Visit: Payer: Medicaid Other | Attending: Internal Medicine | Admitting: Internal Medicine

## 2015-06-05 ENCOUNTER — Encounter: Payer: Self-pay | Admitting: Internal Medicine

## 2015-06-05 VITALS — BP 129/90 | HR 97 | Temp 98.0°F | Resp 16 | Wt 179.0 lb

## 2015-06-05 DIAGNOSIS — Z800 Family history of malignant neoplasm of digestive organs: Secondary | ICD-10-CM | POA: Diagnosis not present

## 2015-06-05 DIAGNOSIS — Z7982 Long term (current) use of aspirin: Secondary | ICD-10-CM | POA: Insufficient documentation

## 2015-06-05 DIAGNOSIS — F419 Anxiety disorder, unspecified: Secondary | ICD-10-CM | POA: Insufficient documentation

## 2015-06-05 DIAGNOSIS — Z1211 Encounter for screening for malignant neoplasm of colon: Secondary | ICD-10-CM | POA: Diagnosis not present

## 2015-06-05 DIAGNOSIS — F329 Major depressive disorder, single episode, unspecified: Secondary | ICD-10-CM | POA: Diagnosis not present

## 2015-06-05 DIAGNOSIS — G40909 Epilepsy, unspecified, not intractable, without status epilepticus: Secondary | ICD-10-CM | POA: Insufficient documentation

## 2015-06-05 DIAGNOSIS — G43909 Migraine, unspecified, not intractable, without status migrainosus: Secondary | ICD-10-CM | POA: Insufficient documentation

## 2015-06-05 DIAGNOSIS — Z8669 Personal history of other diseases of the nervous system and sense organs: Secondary | ICD-10-CM | POA: Diagnosis not present

## 2015-06-05 DIAGNOSIS — Z87898 Personal history of other specified conditions: Secondary | ICD-10-CM

## 2015-06-05 DIAGNOSIS — I10 Essential (primary) hypertension: Secondary | ICD-10-CM | POA: Insufficient documentation

## 2015-06-05 DIAGNOSIS — R569 Unspecified convulsions: Secondary | ICD-10-CM | POA: Diagnosis present

## 2015-06-05 DIAGNOSIS — Z79899 Other long term (current) drug therapy: Secondary | ICD-10-CM | POA: Insufficient documentation

## 2015-06-05 DIAGNOSIS — Z87891 Personal history of nicotine dependence: Secondary | ICD-10-CM | POA: Diagnosis not present

## 2015-06-05 MED ORDER — RIZATRIPTAN BENZOATE 5 MG PO TABS: 5.0000 mg | ORAL_TABLET | ORAL | Status: DC | PRN

## 2015-06-05 NOTE — Progress Notes (Signed)
MRN: [id number] Name: Spencer Mendoza  Sex: male Age: 54 y.o. DOB: 12/23/1960  Allergies: Codeine  Chief Complaint  Patient presents with  . Follow-up    HPI: Patient is 54 y.o. male who history of seizure disorder, migraine headaches, hypertension comes today for followup , as per patienthe is also following up with his psychiatrist and is on amitriptyline, Neurontin and Zoloft, as per patient he used to follow with the neurologist in the past and currently has gotten his insurance and needs referral, sometimes his migraine headaches are worse which does not improve with  Over-the-counter pain medication, his headache is more in the back and occipital area which is pounding and he gets problem with bright lights, currently denies any severe pain, denies any numbness weakness. Patient also reports family history of colon cancer.  Past Medical History  Diagnosis Date  . Epilepsy   . Migraine   . Anxiety   . Depression     Past Surgical History  Procedure Laterality Date  . Knee arthroscopy Right   . Appendectomy        Medication List       This list is accurate as of: 06/05/15  4:16 PM.  Always use your most recent med list.               albuterol 108 (90 BASE) MCG/ACT inhaler  Commonly known as:  PROVENTIL HFA;VENTOLIN HFA  Inhale 1 puff into the lungs every 6 (six) hours as needed for wheezing or shortness of breath.     amitriptyline 25 MG tablet  Commonly known as:  ELAVIL  Take 1 tablet (25 mg total) by mouth at bedtime. For sleep     aspirin 81 MG EC tablet  Take 1 tablet (81 mg total) by mouth daily. (May purchase this medicine from over the counter at local pharmacy): For heart health     atenolol 50 MG tablet  Commonly known as:  TENORMIN  Take 1 tablet (50 mg total) by mouth 2 (two) times daily. For high blood pressure     BC HEADACHE PO  Take 1 packet by mouth every 8 (eight) hours as needed (for pain).     carbamazepine 200 MG tablet    Commonly known as:  TEGRETOL  Take 1 tablet (200 mg total) by mouth 2 (two) times daily after a meal. For seizures     cetirizine 10 MG chewable tablet  Commonly known as:  ZYRTEC  Chew 10 mg by mouth daily.     chlordiazePOXIDE 25 MG capsule  Commonly known as:  LIBRIUM  Take 25 mg (1capsule) at bedtime on 12-01-14: For alcohol withdrawal symptoms. Take 25 mg (1 capsule) three times daily on 12-02-14: For alcohol withdrawal symptoms. Take 25 mg (1 capsule) twice daily on 12-03-13: For alcohol withdrawal symptoms. Take 25 mg (1 capsule) once daily on 12-04-14: For alcohol withdrawal symptoms. Disp: #7 capsules.     famotidine 20 MG tablet  Commonly known as:  PEPCID  Take 1 tablet (20 mg total) by mouth 2 (two) times daily.     gabapentin 800 MG tablet  Commonly known as:  NEURONTIN  Take 800 mg by mouth 3 (three) times daily.     hydrOXYzine 25 MG tablet  Commonly known as:  ATARAX/VISTARIL  Take 1 tablet (25 mg total) by mouth every 6 (six) hours as needed for anxiety.     magnesium oxide 400 MG tablet  Commonly known as:  MAG-OX  Take 400 mg by mouth daily.     pantoprazole 40 MG tablet  Commonly known as:  PROTONIX  Take 1 tablet (40 mg total) by mouth 2 (two) times daily.     rizatriptan 5 MG tablet  Commonly known as:  MAXALT  Take 1 tablet (5 mg total) by mouth as needed for migraine. May repeat in 2 hours if needed     sertraline 50 MG tablet  Commonly known as:  ZOLOFT  Take 50 mg by mouth at bedtime.     venlafaxine 75 MG tablet  Commonly known as:  EFFEXOR  Take 1 tablet (75 mg total) by mouth 2 (two) times daily. For depression     vitamin B-12 100 MCG tablet  Commonly known as:  CYANOCOBALAMIN  Take 100 mcg by mouth daily.     vitamin C 500 MG tablet  Commonly known as:  ASCORBIC ACID  Take 500 mg by mouth daily.        Meds ordered this encounter  Medications  . rizatriptan (MAXALT) 5 MG tablet    Sig: Take 1 tablet (5 mg total) by mouth as  needed for migraine. May repeat in 2 hours if needed    Dispense:  10 tablet    Refill:  1    Immunization History  Administered Date(s) Administered  . Influenza,inj,Quad PF,36+ Mos 07/30/2013    Family History  Problem Relation Age of Onset  . Heart disease Mother   . Stroke Mother   . Heart disease Father   . Hypertension Father     History  Substance Use Topics  . Smoking status: Former Smoker -- 1.00 packs/day for 35 years  . Smokeless tobacco: Never Used  . Alcohol Use: 4.8 oz/week    8 Cans of beer per week     Comment: drinks daily 6 pack a day    Review of Systems   As noted in HPI  Filed Vitals:   06/05/15 1455  BP: 129/90  Pulse: 97  Temp: 98 F (36.7 C)  Resp: 16    Physical Exam  Physical Exam  Constitutional: He is oriented to person, place, and time. No distress.  HENT:  no tongue bite  Eyes: EOM are normal. Pupils are equal, round, and reactive to light.  Cardiovascular: Normal rate and regular rhythm.   Pulmonary/Chest: Breath sounds normal. No respiratory distress. He has no wheezes. He has no rales.  Musculoskeletal: He exhibits no edema.  Neurological: He is alert and oriented to person, place, and time.    CBC    Component Value Date/Time   WBC 14.8* 05/21/2015 1210   RBC 5.65 05/21/2015 1210   HGB 17.3* 05/21/2015 1210   HCT 49.1 05/21/2015 1210   PLT 395 05/21/2015 1210   MCV 86.9 05/21/2015 1210   LYMPHSABS 3.7 05/21/2015 1210   MONOABS 1.5* 05/21/2015 1210   EOSABS 0.1 05/21/2015 1210   BASOSABS 0.1 05/21/2015 1210    CMP     Component Value Date/Time   NA 135 05/21/2015 1210   K 3.3* 05/21/2015 1210   CL 98* 05/21/2015 1210   CO2 21* 05/21/2015 1210   GLUCOSE 115* 05/21/2015 1210   BUN 20 05/21/2015 1210   CREATININE 1.35* 05/21/2015 1210   CREATININE 0.99 03/13/2015 1004   CALCIUM 10.2 05/21/2015 1210   PROT 8.3* 05/21/2015 1210   ALBUMIN 4.9 05/21/2015 1210   AST 26 05/21/2015 1210   ALT 20 05/21/2015 1210    ALKPHOS 99 05/21/2015  1210   BILITOT 1.4* 05/21/2015 1210   GFRNONAA 57* 05/21/2015 1210   GFRNONAA 85 03/13/2015 1004   GFRAA >60 05/21/2015 1210   GFRAA >89 03/13/2015 1004    Lab Results  Component Value Date/Time   CHOL 174 12/05/2013 04:50 AM    Lab Results  Component Value Date/Time   HGBA1C 5.0 12/05/2013 04:50 AM    Lab Results  Component Value Date/Time   AST 26 05/21/2015 12:10 PM    Assessment and Plan  History of seizure - Plan: Ambulatory referral to Neurology  Migraine without status migrainosus, not intractable, unspecified migraine type - Plan: Ambulatory referral to Neurology, rizatriptan ([place]) 5 MG tablet  Family history of colon cancer - Plan: Ambulatory referral to Gastroenterology  Special screening for malignant neoplasms, colon - Plan: Ambulatory referral to Gastroenterology   Health Maintenance -Colonoscopy:referred to GI   Return in about 3 months (around 09/05/2015), or if symptoms worsen or fail to improve.   This note has been created with Surveyor, quantity. Any transcriptional errors are unintentional.    Lorayne Marek, MD

## 2015-06-05 NOTE — Progress Notes (Signed)
Patient here for follow up  Patient is requesting a referral to neuro for his epilepsy and to GI doctor for Colonoscopy Patient also complains of having a rash to his right arm

## 2015-06-06 ENCOUNTER — Encounter: Payer: Self-pay | Admitting: Neurology

## 2015-06-06 ENCOUNTER — Ambulatory Visit (INDEPENDENT_AMBULATORY_CARE_PROVIDER_SITE_OTHER): Payer: Medicaid Other | Admitting: Neurology

## 2015-06-06 VITALS — BP 136/92 | HR 81 | Ht 72.0 in | Wt 181.8 lb

## 2015-06-06 DIAGNOSIS — G4700 Insomnia, unspecified: Secondary | ICD-10-CM | POA: Diagnosis not present

## 2015-06-06 DIAGNOSIS — K922 Gastrointestinal hemorrhage, unspecified: Secondary | ICD-10-CM

## 2015-06-06 DIAGNOSIS — G43401 Hemiplegic migraine, not intractable, with status migrainosus: Secondary | ICD-10-CM

## 2015-06-06 DIAGNOSIS — F5104 Psychophysiologic insomnia: Secondary | ICD-10-CM

## 2015-06-06 DIAGNOSIS — R569 Unspecified convulsions: Secondary | ICD-10-CM

## 2015-06-06 DIAGNOSIS — G43411 Hemiplegic migraine, intractable, with status migrainosus: Secondary | ICD-10-CM

## 2015-06-06 HISTORY — DX: Psychophysiologic insomnia: F51.04

## 2015-06-06 HISTORY — DX: Hemiplegic migraine, not intractable, with status migrainosus: G43.401

## 2015-06-06 MED ORDER — PROPRANOLOL HCL 10 MG PO TABS: ORAL_TABLET | ORAL | Status: DC

## 2015-06-06 NOTE — Progress Notes (Signed)
Reason for visit: Headache  Referring physician: Dr. Michelene Gardener Spencer Mendoza is a 54 y.o. male  History of present illness:  Spencer Mendoza is a 54 year old right-handed white male with a history of intractable migraine headache. The patient has been seen and evaluated in the past by Dr. Delice Lesch from [facility]. The patient has undergone MRI evaluation of the brain that was unremarkable, and he has had ambulatory EEG evaluations that were normal. The patient has a history of seizures since childhood, but nonepileptic seizure events were suspected. The patient has had a lot of issues with an anxiety disorder, he indicates that he does not have much issues with depression. He has a diagnosis of bipolar disorder. He has been on several medications for his headache and seizures which have offered no benefit or were not tolerated. Topamax resulted in extreme irritability, Depakote increased tremors and did not help. The patient has been on amitriptyline at 50 mg at night, he continues to have issues with chronic insomnia. He has been on carbamazepine, currently he has been off this medication. He has been on Effexor previously without benefit. The patient indicates that the anxiety is quite high, he will pace the floor throughout the day, and the anxiety issues are a driving factor in the insomnia problems. The patient indicates that he has chronic daily headaches that are mainly in the right occipital area, the last day without headache was 2 months ago. The patient may have photophobia and phonophobia with the headache, he may have nausea and vomiting. He has had one episode 18 months ago with right-sided weakness, felt to be hemiplegic migraine. He has not had any further events. The patient indicates that weather changes may activate headache, and he will have some visual scotoma as an aura to the headache. He may become confused with the headache.  The patient indicates that he has had  seizures since childhood, he is having ongoing seizure events that are associated with automatic behavior involving the arms, and staring off. The patient has 4 or 5 seizures a year. He does not operate a motor vehicle. He is on gabapentin only for the seizures currently, this is not helping his headache either. He is sent to this office for an evaluation.  Past Medical History  Diagnosis Date  . Epilepsy   . Migraine   . Anxiety   . Depression   . Hemiplegic migraine with status migrainosus 06/06/2015  . Chronic insomnia 06/06/2015    Past Surgical History  Procedure Laterality Date  . Knee arthroscopy Right   . Appendectomy    . Nose surgery      Family History  Problem Relation Age of Onset  . Heart disease Mother   . Stroke Mother   . Epilepsy Mother   . Heart disease Father   . Hypertension Father   . Melanoma Father   . Migraines Father   . Colon cancer Sister   . Colon cancer Cousin     Social history:  reports that he has quit smoking. He has never used smokeless tobacco. He reports that he uses illicit drugs (Marijuana). He reports that he does not drink alcohol.  Medications:  Prior to Admission medications   Medication Sig Start Date End Date Taking? Authorizing Provider  albuterol (PROVENTIL HFA;VENTOLIN HFA) 108 (90 BASE) MCG/ACT inhaler Inhale 1 puff into the lungs every 6 (six) hours as needed for wheezing or shortness of breath.   Yes Historical Provider, MD  amitriptyline (  ELAVIL) 25 MG tablet Take 1 tablet (25 mg total) by mouth at bedtime. For sleep Patient taking differently: Take 50 mg by mouth at bedtime. For sleep 12/01/14  Yes Encarnacion Slates, NP  Aspirin-Salicylamide-Caffeine (BC HEADACHE PO) Take 1 packet by mouth every 8 (eight) hours as needed (for pain).   Yes Historical Provider, MD  cetirizine (ZYRTEC) 10 MG chewable tablet Chew 10 mg by mouth daily.   Yes Historical Provider, MD  gabapentin (NEURONTIN) 800 MG tablet Take 800 mg by mouth 3 (three)  times daily.   Yes Historical Provider, MD  hydrOXYzine (ATARAX/VISTARIL) 25 MG tablet Take 1 tablet (25 mg total) by mouth every 6 (six) hours as needed for anxiety. Patient taking differently: Take 50 mg by mouth every 8 (eight) hours as needed for anxiety.  12/01/14  Yes Encarnacion Slates, NP  magnesium oxide (MAG-OX) 400 MG tablet Take 400 mg by mouth daily.   Yes Historical Provider, MD  sertraline (ZOLOFT) 50 MG tablet Take 100 mg by mouth at bedtime.    Yes Historical Provider, MD  vitamin B-12 (CYANOCOBALAMIN) 100 MCG tablet Take 100 mcg by mouth daily.   Yes Historical Provider, MD  vitamin C (ASCORBIC ACID) 500 MG tablet Take 500 mg by mouth daily.   Yes Historical Provider, MD      Allergies  Allergen Reactions  . Codeine Nausea And Vomiting  . Depakote [Divalproex Sodium]     Made patient shake  . Topamax [Topiramate]     Made patient angry    ROS:  Out of a complete 14 system review of symptoms, the patient complains only of the following symptoms, and all other reviewed systems are negative.  Hearing loss, ringing in the ears Skin rash Increased thirst Allergies Memory loss, confusion, headache, slurred speech, dizziness, tremor Depression, anxiety, not enough sleep, racing thoughts Insomnia, sleepiness, restless legs  Blood pressure 136/92, pulse 81, height 6' (1.829 m), weight 181 lb 12.8 oz (82.464 kg).  Physical Exam  General: The patient is alert and cooperative at the time of the examination.  Eyes: Pupils are equal, round, and reactive to light. Discs are flat bilaterally.  Neck: The neck is supple, no carotid bruits are noted.  Respiratory: The respiratory examination is clear.  Cardiovascular: The cardiovascular examination reveals a regular rate and rhythm, no obvious murmurs or rubs are noted.  Skin: Extremities are without significant edema.  Neurologic Exam  Mental status: The patient is alert and oriented x 3 at the time of the examination. The  patient has apparent normal recent and remote memory, with an apparently normal attention span and concentration ability.  Cranial nerves: Facial symmetry is present. There is good sensation of the face to pinprick and soft touch on the left, decreased on the right. The patient splits the midline with vibration sensation on the forehead, decreased on the right. The strength of the facial muscles and the muscles to head turning and shoulder shrug are normal bilaterally. Speech is well enunciated, no aphasia or dysarthria is noted. Extraocular movements are full. Visual fields are full. The tongue is midline, and the patient has symmetric elevation of the soft palate. No obvious hearing deficits are noted.  Motor: The motor testing reveals 5 over 5 strength of all 4 extremities. Good symmetric motor tone is noted throughout.  Sensory: Sensory testing is notable for decreased pinprick and vibration sensation on the right arm and leg, normal on the left. Position sense is intact on all fours. No  evidence of extinction is noted.  Coordination: Cerebellar testing reveals good finger-nose-finger and heel-to-shin bilaterally.  Gait and station: Gait is normal. Tandem gait is normal. Romberg is negative. No drift is seen.  Reflexes: Deep tendon reflexes are symmetric and normal bilaterally. Toes are downgoing bilaterally.   Assessment/Plan:  1. Intractable migraine headache with hemiplegic features  2. History of seizures versus pseudoseizures  3. Anxiety disorder, bipolar disorder  4. Chronic insomnia  5. Nonorganic clinical examination  The patient has been evaluated through neurology previously with normal MRI evaluation of the brain and normal EEG. There is some question of pseudoseizures. The clinical examination today has nonorganic features. The patient has significant anxiety issues at this time that may be playing into increasing headache, and resulting in chronic insomnia. The patient  will placed on propranolol for the headaches and for anxiety, the patient will contact me if he believes that he is not tolerating the medication. The patient may be a candidate for Botox injections in the future. He will follow-up in 3-4 months.  Jill Alexanders MD 06/06/2015 8:06 PM  [facility] [address] [place], [place] [address]  Phone [phone] Fax [phone]

## 2015-06-06 NOTE — Patient Instructions (Signed)

## 2015-06-07 ENCOUNTER — Encounter: Payer: Self-pay | Admitting: Gastroenterology

## 2015-06-15 ENCOUNTER — Telehealth: Payer: Self-pay | Admitting: Neurology

## 2015-06-15 NOTE — Telephone Encounter (Signed)
Patient came in today for infusion. Requested appointment with Dr. Jannifer Franklin. Appointment scheduled 8/1.

## 2015-06-15 NOTE — Telephone Encounter (Signed)
Pt called stating headache x 5 days, please call pt

## 2015-06-15 NOTE — Telephone Encounter (Signed)
I called patient. The patient is having a severe migraine, went to the emergency room yesterday, the shots did not help. I will have him come in to get a Depacon injection, may give Toradol as well. Would give him 1 g of IV Depacon, repeat a 500 mg dose if necessary, 30 mg IV of Toradol.

## 2015-06-15 NOTE — Telephone Encounter (Signed)
I called the patient. He said about 3 days ago, he had some confusion and then the migraines came on like they normally do. He has had a pounding migraine for 3 days now. He says his head pounds every time his heart beats. He went to the ED last night. They gave him a steroid, benadryl and zofran. This did not relieve his pain. He has been up all night with nausea and vomiting. Any light or noise bothers him. Please advise.

## 2015-06-18 ENCOUNTER — Ambulatory Visit (INDEPENDENT_AMBULATORY_CARE_PROVIDER_SITE_OTHER): Payer: Medicaid Other | Admitting: Neurology

## 2015-06-18 ENCOUNTER — Encounter: Payer: Self-pay | Admitting: Neurology

## 2015-06-18 VITALS — BP 130/88 | HR 69 | Ht 72.0 in | Wt 187.0 lb

## 2015-06-18 DIAGNOSIS — R569 Unspecified convulsions: Secondary | ICD-10-CM | POA: Diagnosis not present

## 2015-06-18 DIAGNOSIS — G43411 Hemiplegic migraine, intractable, with status migrainosus: Secondary | ICD-10-CM | POA: Diagnosis not present

## 2015-06-18 MED ORDER — AMITRIPTYLINE HCL 25 MG PO TABS: 75.0000 mg | ORAL_TABLET | Freq: Every day | ORAL | Status: DC

## 2015-06-18 MED ORDER — DEXAMETHASONE 2 MG PO TABS: ORAL_TABLET | ORAL | Status: DC

## 2015-06-18 MED ORDER — DIHYDROERGOTAMINE MESYLATE 4 MG/ML NA SOLN: 1.0000 | Freq: Four times a day (QID) | NASAL | Status: DC | PRN

## 2015-06-18 NOTE — Progress Notes (Signed)
Reason for visit: Migraine headache  Spencer Mendoza is an 54 y.o. male  History of present illness:  [name] is a 54 year old right-handed white male with a history of intractable migraine headache. He has a significant anxiety disorder, and his prior neurologic examination showed non-organic features with a right hemisensory deficit. The patient indicates that some of his headaches are associated with hemiplegic symptoms. He has had ongoing daily headaches, he was seen 3 days ago, he received a Depacon injection which seemed to improve the headache for 2 days, but the headache started back yesterday. He has just started the propranolol, he is only on 10 mg twice daily, he takes amitriptyline 50 mg at night, but he is still not sleeping well. The patient returns to the office today for an evaluation. He reports some problems with tremors as well. The patient reports some stuttering speech problems during the headache.  Past Medical History  Diagnosis Date  . Epilepsy   . Migraine   . Anxiety   . Depression   . Hemiplegic migraine with status migrainosus 06/06/2015  . Chronic insomnia 06/06/2015    Past Surgical History  Procedure Laterality Date  . Knee arthroscopy Right   . Appendectomy    . Nose surgery      Family History  Problem Relation Age of Onset  . Heart disease Mother   . Stroke Mother   . Epilepsy Mother   . Heart disease Father   . Hypertension Father   . Melanoma Father   . Migraines Father   . Colon cancer Sister   . Colon cancer Cousin     Social history:  reports that he has quit smoking. He has never used smokeless tobacco. He reports that he uses illicit drugs (Marijuana). He reports that he does not drink alcohol.    Allergies  Allergen Reactions  . Codeine Nausea And Vomiting  . Depakote [Divalproex Sodium]     Made patient shake  . Topamax [Topiramate]     Made patient angry    Medications:  Prior to Admission medications     Medication Sig Start Date End Date Taking? Authorizing Provider  albuterol (PROVENTIL HFA;VENTOLIN HFA) 108 (90 BASE) MCG/ACT inhaler Inhale 1 puff into the lungs every 6 (six) hours as needed for wheezing or shortness of breath.   Yes Historical Provider, MD  amitriptyline (ELAVIL) 25 MG tablet Take 1 tablet (25 mg total) by mouth at bedtime. For sleep Patient taking differently: Take 50 mg by mouth at bedtime. For sleep 12/01/14  Yes Encarnacion Slates, NP  Aspirin-Salicylamide-Caffeine (BC HEADACHE PO) Take 1 packet by mouth every 8 (eight) hours as needed (for pain).   Yes Historical Provider, MD  cetirizine (ZYRTEC) 10 MG chewable tablet Chew 10 mg by mouth daily.   Yes Historical Provider, MD  gabapentin (NEURONTIN) 800 MG tablet Take 800 mg by mouth 3 (three) times daily.   Yes Historical Provider, MD  hydrOXYzine (ATARAX/VISTARIL) 25 MG tablet Take 1 tablet (25 mg total) by mouth every 6 (six) hours as needed for anxiety. Patient taking differently: Take 50 mg by mouth every 8 (eight) hours as needed for anxiety.  12/01/14  Yes Encarnacion Slates, NP  magnesium oxide (MAG-OX) 400 MG tablet Take 400 mg by mouth daily.   Yes Historical Provider, MD  propranolol (INDERAL) 10 MG tablet One tablet twice a day for 2 weeks, then take 2 tablets twice a day 06/06/15  Yes Kathrynn Ducking, MD  sertraline (ZOLOFT) 50 MG tablet Take 100 mg by mouth at bedtime.    Yes Historical Provider, MD    ROS:  Out of a complete 14 system review of symptoms, the patient complains only of the following symptoms, and all other reviewed systems are negative.  Fatigue Ringing in the ears Nausea Frequent waking, daytime sleepiness Neck pain Headache, speech difficulty Anxiety Hyperactivity  Blood pressure 130/88, pulse 69, height 6' (1.829 m), weight 187 lb (84.823 kg).  Physical Exam  General: The patient is alert and cooperative at the time of the examination.  Skin: No significant peripheral edema is  noted.   Neurologic Exam  Mental status: The patient is alert and oriented x 3 at the time of the examination. The patient has apparent normal recent and remote memory, with an apparently normal attention span and concentration ability.   Cranial nerves: Facial symmetry is present. Speech is normal, no aphasia or dysarthria is noted. Extraocular movements are full. Visual fields are full.  Motor: The patient has good strength in all 4 extremities.  Sensory examination: Soft touch sensation is reduced on the right arm and leg and face relative to the left.  Coordination: The patient has good finger-nose-finger and heel-to-shin bilaterally.  Gait and station: The patient has a normal gait. Tandem gait is normal. Romberg is negative, but is somewhat unsteady. No drift is seen.  Reflexes: Deep tendon reflexes are symmetric.   Assessment/Plan:  1. Hemiplegic migraine, intractable  2. Anxiety disorder  3. Chronic insomnia  4. Nonorganic neurologic examination  The patient continues to have ongoing headaches. The propranolol will be increased taking 20 mg twice daily, if he is tolerating this medication we will continue to go up on the dose. The patient will be increased on amitriptyline taking 75 mg at night. Migranal nasal spray will be added to the regimen to take if needed. The patient will follow-up in November 2016. Botox therapy may be considered if he is not improving over the next 6-8 weeks.  Jill Alexanders MD 06/18/2015 8:15 PM  [facility] [address] [place], [place] [address]  Phone [phone] Fax [phone]

## 2015-06-18 NOTE — Patient Instructions (Addendum)
On the propranolol taking 20 mg twice daily now, if you have side effects on the medication such as dizziness, depression, or fatigue, left me know. If you are tolerating the medication, but the headaches are still a problem, let me know after another week or so, we can adjust the dose and continue to increase the medication. We will give you Decadron for the next 3 days to alleviate the headache. We will go up on amitriptyline taking 75 mg at night. You have a revisit appointment already in November 2016, but you may call before then if you are having problems. If the headaches are not improving over the next 6-8 weeks, please let me know, we will have you come into signed a consent form to initiate Botox therapy.   Migraine Headache A migraine headache is an intense, throbbing pain on one or both sides of your head. A migraine can last for 30 minutes to several hours. CAUSES  The exact cause of a migraine headache is not always known. However, a migraine may be caused when nerves in the brain become irritated and release chemicals that cause inflammation. This causes pain. Certain things may also trigger migraines, such as:  Alcohol.  Smoking.  Stress.  Menstruation.  Aged cheeses.  Foods or drinks that contain nitrates, glutamate, aspartame, or tyramine.  Lack of sleep.  Chocolate.  Caffeine.  Hunger.  Physical exertion.  Fatigue.  Medicines used to treat chest pain (nitroglycerine), birth control pills, estrogen, and some blood pressure medicines. SIGNS AND SYMPTOMS  Pain on one or both sides of your head.  Pulsating or throbbing pain.  Severe pain that prevents daily activities.  Pain that is aggravated by any physical activity.  Nausea, vomiting, or both.  Dizziness.  Pain with exposure to bright lights, loud noises, or activity.  General sensitivity to bright lights, loud noises, or smells. Before you get a migraine, you may get warning signs that a  migraine is coming (aura). An aura may include:  Seeing flashing lights.  Seeing bright spots, halos, or zigzag lines.  Having tunnel vision or blurred vision.  Having feelings of numbness or tingling.  Having trouble talking.  Having muscle weakness. DIAGNOSIS  A migraine headache is often diagnosed based on:  Symptoms.  Physical exam.  A CT scan or MRI of your head. These imaging tests cannot diagnose migraines, but they can help rule out other causes of headaches. TREATMENT Medicines may be given for pain and nausea. Medicines can also be given to help prevent recurrent migraines.  HOME CARE INSTRUCTIONS  Only take over-the-counter or prescription medicines for pain or discomfort as directed by your health care provider. The use of long-term narcotics is not recommended.  Lie down in a dark, quiet room when you have a migraine.  Keep a journal to find out what may trigger your migraine headaches. For example, write down:  What you eat and drink.  How much sleep you get.  Any change to your diet or medicines.  Limit alcohol consumption.  Quit smoking if you smoke.  Get 7-9 hours of sleep, or as recommended by your health care provider.  Limit stress.  Keep lights dim if bright lights bother you and make your migraines worse. SEEK IMMEDIATE MEDICAL CARE IF:   Your migraine becomes severe.  You have a fever.  You have a stiff neck.  You have vision loss.  You have muscular weakness or loss of muscle control.  You start losing your balance  or have trouble walking.  You feel faint or pass out.  You have severe symptoms that are different from your first symptoms. MAKE SURE YOU:   Understand these instructions.  Will watch your condition.  Will get help right away if you are not doing well or get worse. Document Released: 11/03/2005 Document Revised: 03/20/2014 Document Reviewed: 07/11/2013 [facility] Patient Information 2015 [place], [place]. This  information is not intended to replace advice given to you by your health care provider. Make sure you discuss any questions you have with your health care provider.

## 2015-06-20 ENCOUNTER — Ambulatory Visit (AMBULATORY_SURGERY_CENTER): Payer: Self-pay

## 2015-06-20 VITALS — Ht 72.0 in | Wt 183.0 lb

## 2015-06-20 DIAGNOSIS — Z800 Family history of malignant neoplasm of digestive organs: Secondary | ICD-10-CM

## 2015-06-20 MED ORDER — PEG-KCL-NACL-NASULF-NA ASC-C 100 G PO SOLR: 1.0000 | Freq: Once | ORAL | Status: DC

## 2015-06-20 NOTE — Progress Notes (Signed)
No allergies to egg or soy No hx of anesthesia complications Not on home 02 Emmi video declined

## 2015-07-04 ENCOUNTER — Ambulatory Visit (AMBULATORY_SURGERY_CENTER): Payer: Medicaid Other | Admitting: Gastroenterology

## 2015-07-04 ENCOUNTER — Encounter: Payer: Self-pay | Admitting: Gastroenterology

## 2015-07-04 VITALS — BP 113/89 | HR 72 | Temp 97.0°F | Resp 20 | Ht 72.0 in | Wt 183.0 lb

## 2015-07-04 DIAGNOSIS — Z1211 Encounter for screening for malignant neoplasm of colon: Secondary | ICD-10-CM | POA: Diagnosis not present

## 2015-07-04 DIAGNOSIS — Z800 Family history of malignant neoplasm of digestive organs: Secondary | ICD-10-CM

## 2015-07-04 MED ORDER — SODIUM CHLORIDE 0.9 % IV SOLN: 500.0000 mL | INTRAVENOUS | Status: DC

## 2015-07-04 NOTE — Op Note (Addendum)
[facility]  [address] [place], [id number]   COLONOSCOPY PROCEDURE REPORT  PATIENT: Spencer Mendoza, Spencer Mendoza  MR#: [id number] BIRTHDATE: 01-12-1961 , 54  yrs. old GENDER: male ENDOSCOPIST: Milus Banister, MD REFERRED SF:SELTRV Advani, MD PROCEDURE DATE:  07/04/2015 PROCEDURE:   Colonoscopy, screening First Screening Colonoscopy - Avg.  risk and is 50 yrs.  old or older Yes.  Prior Negative Screening - Now for repeat screening. N/A  History of Adenoma - Now for follow-up colonoscopy & has been > or = to 3 yrs.  N/A  high risk ASA CLASS:   Class II INDICATIONS:Screening for colonic neoplasia and FH Colon or Rectal Adenocarcinoma. MEDICATIONS: Monitored anesthesia care and Propofol 250 mg IV  DESCRIPTION OF PROCEDURE:   After the risks benefits and alternatives of the procedure were thoroughly explained, informed consent was obtained.  The digital rectal exam revealed no abnormalities of the rectum.   The LB [id number]  endoscope was introduced through the anus and advanced to the cecum, which was identified by both the appendix and ileocecal valve. No adverse events experienced.   The quality of the prep was excellent.  The instrument was then slowly withdrawn as the colon was fully examined. Estimated blood loss is zero unless otherwise noted in this procedure report.   COLON FINDINGS: A normal appearing cecum, ileocecal valve, and appendiceal orifice were identified.  The ascending, transverse, descending, sigmoid colon, and rectum appeared unremarkable. Retroflexed views revealed no abnormalities. The time to cecum = 3.6 Withdrawal time = 8.2   The scope was withdrawn and the procedure completed. COMPLICATIONS: There were no immediate complications.  ENDOSCOPIC IMPRESSION: Normal colonoscopy No polyps or cancers  RECOMMENDATIONS: Given your significant family history of colon cancer (sister and cousin), you should have a repeat colonoscopy in 5  years  eSigned:  Milus Banister, MD 07/04/2015 9:59 AM Revised: 07/04/2015 9:59 AM

## 2015-07-04 NOTE — Patient Instructions (Signed)
YOU HAD AN ENDOSCOPIC PROCEDURE TODAY AT [facility]:   Refer to the procedure report that was given to you for any specific questions about what was found during the examination.  If the procedure report does not answer your questions, please call your gastroenterologist to clarify.  If you requested that your care partner not be given the details of your procedure findings, then the procedure report has been included in a sealed envelope for you to review at your convenience later.  YOU SHOULD EXPECT: Some feelings of bloating in the abdomen. Passage of more gas than usual.  Walking can help get rid of the air that was put into your GI tract during the procedure and reduce the bloating. If you had a lower endoscopy (such as a colonoscopy or flexible sigmoidoscopy) you may notice spotting of blood in your stool or on the toilet paper. If you underwent a bowel prep for your procedure, you may not have a normal bowel movement for a few days.  Please Note:  You might notice some irritation and congestion in your nose or some drainage.  This is from the oxygen used during your procedure.  There is no need for concern and it should clear up in a day or so.  SYMPTOMS TO REPORT IMMEDIATELY:   Following lower endoscopy (colonoscopy or flexible sigmoidoscopy):  Excessive amounts of blood in the stool  Significant tenderness or worsening of abdominal pains  Swelling of the abdomen that is new, acute  Fever of 100F or higher  For urgent or emergent issues, a gastroenterologist can be reached at any hour by calling [phone].   DIET: Your first meal following the procedure should be a small meal and then it is ok to progress to your normal diet. Heavy or fried foods are harder to digest and may make you feel nauseous or bloated.  Likewise, meals heavy in dairy and vegetables can increase bloating.  Drink plenty of fluids but you should avoid alcoholic beverages for 24  hours.  ACTIVITY:  You should plan to take it easy for the rest of today and you should NOT DRIVE or use heavy machinery until tomorrow (because of the sedation medicines used during the test).    FOLLOW UP: Our staff will call the number listed on your records the next business day following your procedure to check on you and address any questions or concerns that you may have regarding the information given to you following your procedure. If we do not reach you, we will leave a message.  However, if you are feeling well and you are not experiencing any problems, there is no need to return our call.  We will assume that you have returned to your regular daily activities without incident.  If any biopsies were taken you will be contacted by phone or by letter within the next 1-3 weeks.  Please call us at [phone] if you have not heard about the biopsies in 3 weeks.    SIGNATURES/CONFIDENTIALITY: You and/or your care partner have signed paperwork which will be entered into your electronic medical record.  These signatures attest to the fact that that the information above on your After Visit Summary has been reviewed and is understood.  Full responsibility of the confidentiality of this discharge information lies with you and/or your care-partner.  Next colonoscopy in 5 years.

## 2015-07-04 NOTE — Progress Notes (Signed)
Transferred to recovery room. A/O x3, pleased with MAC.  VSS.  Report to Wendy, RN. 

## 2015-07-05 ENCOUNTER — Telehealth: Payer: Self-pay | Admitting: Neurology

## 2015-07-05 ENCOUNTER — Telehealth: Payer: Self-pay | Admitting: Emergency Medicine

## 2015-07-05 MED ORDER — PROPRANOLOL HCL 40 MG PO TABS: 40.0000 mg | ORAL_TABLET | Freq: Two times a day (BID) | ORAL | Status: DC

## 2015-07-05 NOTE — Telephone Encounter (Signed)
Pt's wife called and states that he is going to run out of the nasal spray for headaches. The propranolol (INDERAL) 10 MG tablet is not working very well . He is having to use the nasal spray often. Please call and advise [phone]

## 2015-07-05 NOTE — Telephone Encounter (Signed)
Left message f/u , no identifier

## 2015-07-05 NOTE — Telephone Encounter (Signed)
I called the patient. He stated that he cannot tell that the propranolol is helping his migraines at all. He is not about to run out of the nasal spray, but he is worried he is having to use it too much. He would like to know if he could try anything else to control his migraines.

## 2015-07-05 NOTE — Telephone Encounter (Signed)
I called the patient. He is on Inderal 20 mg twice daily. I will try increasing the dose to 40 mg twice daily, he is tolerating the medication well. He still having a lot of headaches.

## 2015-07-09 ENCOUNTER — Encounter: Payer: Self-pay | Admitting: Internal Medicine

## 2015-08-12 ENCOUNTER — Other Ambulatory Visit: Payer: Self-pay | Admitting: Neurology

## 2015-08-15 ENCOUNTER — Telehealth: Payer: Self-pay | Admitting: Neurology

## 2015-08-15 NOTE — Telephone Encounter (Signed)
I called the patient. He has had terrible migraines. They have been associated with nausea and vomiting. He feels okay today but would like medication for nausea/vomiting. He would also like to start Botox.

## 2015-08-15 NOTE — Telephone Encounter (Signed)
Patient called to advise he went to beach last week and was in bed 4 days with bad migraine and vomiting. Returned from [facility] Saturday, felt halfway decent Saturday evening then Sunday it came back with a vengeance. Dr. Jannifer Franklin mentioned Botox if propranolol (INDERAL) 40 MG tablet  didn't work. Propranolol doesn't seem to be working. Patient also thought Dr. Jannifer Franklin had previously sent Phenergan Rx to pharmacy but they don't have record of it.

## 2015-08-15 NOTE — Telephone Encounter (Signed)
Called all 3 numbers available, unable to reach the patient. I will try to call later.

## 2015-08-16 MED ORDER — DEXAMETHASONE 2 MG PO TABS: ORAL_TABLET | ORAL | Status: DC

## 2015-08-16 NOTE — Telephone Encounter (Signed)
I called and spoke to the patient's mother in law. I left a message with her asking that she have the patient call me back.

## 2015-08-16 NOTE — Telephone Encounter (Signed)
I tried to call the patient back, [facility] cannot administer medication given insurance issues. I will call in a Decadron taper, I tried to call the patient regarding this, unable to contact him, we'll try again.

## 2015-08-16 NOTE — Telephone Encounter (Signed)
I called the patient. The patient has had a several day headache with nausea and vomiting. I will have him come in for a Depacon injection, add Toradol to this. The patient will contact me if the headache continues.

## 2015-08-16 NOTE — Telephone Encounter (Signed)
Pt's wife called and they have the Rx. Wanted to say thank you.

## 2015-08-16 NOTE — Addendum Note (Signed)
Addended by: Margette Fast on: 08/16/2015 11:11 AM   Modules accepted: Orders

## 2015-08-17 ENCOUNTER — Telehealth: Payer: Self-pay | Admitting: Neurology

## 2015-08-17 MED ORDER — KETOROLAC TROMETHAMINE 10 MG PO TABS: 10.0000 mg | ORAL_TABLET | Freq: Four times a day (QID) | ORAL | Status: DC | PRN

## 2015-08-17 NOTE — Telephone Encounter (Signed)
I called the patient. I advised he continue with the Decadron since he just started it yesterday. He asked if he could have a different nasal spray or have medication for nausea because when he uses the spray he gets extremely sick to his stomach. I advised I would ask Dr. Jannifer Franklin about this.

## 2015-08-17 NOTE — Telephone Encounter (Signed)
Patient is calling and states that the Rx dexamethasone 2 mg prescribed for migraines on his second day of taking does not seem to be helping. The Rx dihydroergotamine 4 mg nasal spray makes him sick on his stomach and he is wondering if he needs another medication for nausea or a different nasal spray all together.  Please call.  Thanks!

## 2015-08-17 NOTE — Telephone Encounter (Signed)
The patient is on a Decadron taper. He is still having headaches. In the past, urine drug screens have been positive for cocaine and THC. We cannot give him any controlled substances. I will call in a prescription for Toradol. The patient indicates that the Migranal nasal spray causes too much nausea.

## 2015-08-23 ENCOUNTER — Other Ambulatory Visit: Payer: Self-pay | Admitting: Family Medicine

## 2015-08-23 DIAGNOSIS — R51 Headache: Principal | ICD-10-CM

## 2015-08-23 DIAGNOSIS — G8929 Other chronic pain: Secondary | ICD-10-CM

## 2015-08-23 DIAGNOSIS — Z0000 Encounter for general adult medical examination without abnormal findings: Secondary | ICD-10-CM

## 2015-10-15 ENCOUNTER — Ambulatory Visit: Payer: Medicaid Other | Admitting: Neurology

## 2015-11-05 ENCOUNTER — Other Ambulatory Visit: Payer: Self-pay | Admitting: Neurology

## 2015-11-13 ENCOUNTER — Other Ambulatory Visit: Payer: Self-pay | Admitting: Neurology

## 2015-11-14 MED ORDER — AMITRIPTYLINE HCL 25 MG PO TABS: 75.0000 mg | ORAL_TABLET | Freq: Every day | ORAL | Status: DC

## 2015-11-14 MED ORDER — PROPRANOLOL HCL 40 MG PO TABS: 40.0000 mg | ORAL_TABLET | Freq: Two times a day (BID) | ORAL | Status: DC

## 2015-11-14 NOTE — Telephone Encounter (Signed)
Rx's have been sent.  Responded to patient via Mychart.

## 2017-10-29 ENCOUNTER — Other Ambulatory Visit: Payer: Self-pay | Admitting: Family

## 2017-10-29 DIAGNOSIS — G43419 Hemiplegic migraine, intractable, without status migrainosus: Secondary | ICD-10-CM

## 2017-11-11 ENCOUNTER — Ambulatory Visit
Admission: RE | Admit: 2017-11-11 | Discharge: 2017-11-11 | Disposition: A | Discharge status: Home / Self Care | Payer: Medicare Other | Source: Ambulatory Visit | Attending: Family | Admitting: Family

## 2017-11-11 ENCOUNTER — Other Ambulatory Visit: Payer: Self-pay

## 2017-11-11 DIAGNOSIS — G43419 Hemiplegic migraine, intractable, without status migrainosus: Secondary | ICD-10-CM

## 2018-01-22 ENCOUNTER — Other Ambulatory Visit: Payer: Self-pay

## 2018-01-22 ENCOUNTER — Emergency Department (HOSPITAL_COMMUNITY): Payer: Medicare Other

## 2018-01-22 ENCOUNTER — Encounter (HOSPITAL_COMMUNITY): Payer: Self-pay | Admitting: Emergency Medicine

## 2018-01-22 ENCOUNTER — Emergency Department (HOSPITAL_COMMUNITY)
Admission: EM | Admit: 2018-01-22 | Discharge: 2018-01-22 | Disposition: A | Discharge status: Home / Self Care | Payer: Medicare Other | Attending: Emergency Medicine | Admitting: Emergency Medicine

## 2018-01-22 DIAGNOSIS — Z8673 Personal history of transient ischemic attack (TIA), and cerebral infarction without residual deficits: Secondary | ICD-10-CM | POA: Insufficient documentation

## 2018-01-22 DIAGNOSIS — R4182 Altered mental status, unspecified: Secondary | ICD-10-CM | POA: Diagnosis not present

## 2018-01-22 DIAGNOSIS — Z79899 Other long term (current) drug therapy: Secondary | ICD-10-CM | POA: Diagnosis not present

## 2018-01-22 DIAGNOSIS — I10 Essential (primary) hypertension: Secondary | ICD-10-CM | POA: Insufficient documentation

## 2018-01-22 DIAGNOSIS — Z8669 Personal history of other diseases of the nervous system and sense organs: Secondary | ICD-10-CM | POA: Diagnosis not present

## 2018-01-22 DIAGNOSIS — Z87891 Personal history of nicotine dependence: Secondary | ICD-10-CM | POA: Diagnosis not present

## 2018-01-22 DIAGNOSIS — F1410 Cocaine abuse, uncomplicated: Secondary | ICD-10-CM | POA: Diagnosis not present

## 2018-01-22 DIAGNOSIS — R072 Precordial pain: Secondary | ICD-10-CM | POA: Insufficient documentation

## 2018-01-22 DIAGNOSIS — R079 Chest pain, unspecified: Secondary | ICD-10-CM | POA: Diagnosis present

## 2018-01-22 LAB — I-STAT TROPONIN, ED: Troponin i, poc: 0 ng/mL (ref 0.00–0.08)

## 2018-01-22 LAB — BASIC METABOLIC PANEL
Anion gap: 18 — ABNORMAL HIGH (ref 5–15)
BUN: 10 mg/dL (ref 6–20)
CO2: 22 mmol/L (ref 22–32)
Calcium: 10.5 mg/dL — ABNORMAL HIGH (ref 8.9–10.3)
Chloride: 102 mmol/L (ref 101–111)
Creatinine, Ser: 1.34 mg/dL — ABNORMAL HIGH (ref 0.61–1.24)
GFR calc Af Amer: >60 mL/min (ref >60)
GFR calc non Af Amer: 57 mL/min — ABNORMAL LOW (ref >60)
Glucose, Bld: 112 mg/dL — ABNORMAL HIGH (ref 65–99)
Potassium: 4.3 mmol/L (ref 3.5–5.1)
Sodium: 142 mmol/L (ref 135–145)

## 2018-01-22 LAB — CBC
HCT: 43.4 % (ref 39.0–52.0)
Hemoglobin: 15.1 g/dL (ref 13.0–17.0)
MCH: 32.1 pg (ref 26.0–34.0)
MCHC: 34.8 g/dL (ref 30.0–36.0)
MCV: 92.3 fL (ref 78.0–100.0)
Platelets: 374 10*3/uL (ref 150–400)
RBC: 4.7 MIL/uL (ref 4.22–5.81)
RDW: 12.8 % (ref 11.5–15.5)
WBC: 20.5 10*3/uL — ABNORMAL HIGH (ref 4.0–10.5)

## 2018-01-22 MED ORDER — METOCLOPRAMIDE HCL 5 MG/ML IJ SOLN
10.0000 mg | Freq: Once | INTRAMUSCULAR | Status: AC
  Administered 2018-01-22: 10 mg via INTRAVENOUS
  Filled 2018-01-22: qty 2

## 2018-01-22 MED ORDER — DIPHENHYDRAMINE HCL 50 MG/ML IJ SOLN
25.0000 mg | Freq: Once | INTRAMUSCULAR | Status: AC
Start: 2018-01-22 — End: 2018-01-22
  Administered 2018-01-22: 25 mg via INTRAVENOUS
  Filled 2018-01-22: qty 1

## 2018-01-22 MED ORDER — DEXAMETHASONE SODIUM PHOSPHATE 10 MG/ML IJ SOLN
10.0000 mg | Freq: Once | INTRAMUSCULAR | Status: AC
  Administered 2018-01-22: 10 mg via INTRAVENOUS
  Filled 2018-01-22: qty 1

## 2018-01-22 MED ORDER — ONDANSETRON HCL 4 MG/2ML IJ SOLN
4.0000 mg | Freq: Once | INTRAMUSCULAR | Status: AC
  Administered 2018-01-22: 4 mg via INTRAVENOUS
  Filled 2018-01-22: qty 2

## 2018-01-22 MED ORDER — LORAZEPAM 2 MG/ML IJ SOLN
1.0000 mg | Freq: Once | INTRAMUSCULAR | Status: AC
  Administered 2018-01-22: 1 mg via INTRAVENOUS
  Filled 2018-01-22: qty 1

## 2018-01-22 MED ORDER — SODIUM CHLORIDE 0.9 % IV BOLUS (SEPSIS)
1000.0000 mL | Freq: Once | INTRAVENOUS | Status: AC
  Administered 2018-01-22: 1000 mL via INTRAVENOUS

## 2018-01-22 MED ORDER — HYDROMORPHONE HCL 1 MG/ML IJ SOLN
1.0000 mg | Freq: Once | INTRAMUSCULAR | Status: AC
  Administered 2018-01-22: 1 mg via INTRAVENOUS
  Filled 2018-01-22: qty 1

## 2018-01-22 NOTE — ED Triage Notes (Signed)
Pt to ER for acute onset central chest pressure this morning approximately 2 hours ago while smoking crack. States nausea and vomiting at the same time with "red blood coming out in my vomit." states was drinking four locos and smoking crack.

## 2018-01-22 NOTE — Discharge Instructions (Signed)
It was our pleasure to provide your ER care today - we hope that you feel better.  Avoid alcohol and/or cocaine use - use resource guide provided for community resources.   Follow up with primary care doctor in the coming week.   Return to ER if worse, new symptoms, fevers, trouble breathing, other concern.   You were given medication in the ER - no driving for the next 6 hours.

## 2018-01-22 NOTE — ED Notes (Signed)
Patient transported to CT 

## 2018-01-22 NOTE — ED Notes (Signed)
Patient transported to X-ray 

## 2018-01-22 NOTE — ED Provider Notes (Signed)
[place] EMERGENCY DEPARTMENT Provider Note   CSN: [id number] Arrival date & time: 01/22/18  0720     History   Chief Complaint Chief Complaint  Patient presents with  . Chest Pain  . Emesis    HPI Spencer Mendoza is a 57 y.o. male.  Patient c/o chest discomfort after drinking four loco beverages and using crack cocaine last night. Pt is anxious this AM. Notes vomited a few times. Denies abd pain or diarrhea. No fever or chills. States he is on disability for severe migraines, and is having a frontal headache now, gradual onset, moderate. Denies eye pain or change in vision. No numbness/weakness or change in normal functional ability. Denies exertional cp or discomfort. Denies hx cad. No pleuritic pain. No leg pain or swelling.    The history is provided by the patient.  Chest Pain   Associated symptoms include headaches and vomiting. Pertinent negatives include no abdominal pain, no back pain, no cough, no fever, no numbness and no shortness of breath.  Emesis   Associated symptoms include headaches. Pertinent negatives include no abdominal pain, no chills, no cough and no fever.    Past Medical History:  Diagnosis Date  . Allergy   . Anxiety   . Chronic insomnia 06/06/2015  . Depression   . Epilepsy ([place])   . Hemiplegic migraine with status migrainosus 06/06/2015  . Hypertension   . Migraine   . Stroke [facility])    patient denies  . Substance abuse [facility])     Patient Active Problem List   Diagnosis Date Noted  . Hemiplegic migraine with status migrainosus 06/06/2015  . Chronic insomnia 06/06/2015  . Cocaine abuse ([place]) 05/22/2015  . Substance induced mood disorder ([place]) 05/22/2015  . Alcohol dependence with uncomplicated withdrawal ([place])   . Headache, migraine   . Migraines 11/29/2014  . Headache 08/08/2014  . Seizures ([place]) 01/09/2014  . Chronic low back pain 12/28/2013  . Hemiplegia, unspecified, affecting dominant side 12/06/2013  . CVA  (cerebral infarction) 12/04/2013  . Leukocytosis 12/04/2013  . Acute Gastroenteritis 12/04/2013  . Hypertension 12/04/2013  . Hemiparesis ([place]) 12/04/2013  . Delirium tremens ([place]) 07/29/2013  . Alcohol withdrawal ([place]) 07/29/2013  . UGIB (upper gastrointestinal bleed) 07/29/2013  . Chest pain 07/28/2013    Past Surgical History:  Procedure Laterality Date  . APPENDECTOMY    . KNEE ARTHROSCOPY Right   . NOSE SURGERY         Home Medications    Prior to Admission medications   Medication Sig Start Date End Date Taking? Authorizing Provider  albuterol (PROVENTIL HFA;VENTOLIN HFA) 108 (90 BASE) MCG/ACT inhaler Inhale 1 puff into the lungs every 6 (six) hours as needed for wheezing or shortness of breath.    [provider]  amitriptyline (ELAVIL) 25 MG tablet Take 3 tablets (75 mg total) by mouth at bedtime. For sleep 11/14/15   Kathrynn Ducking, MD  Aspirin-Salicylamide-Caffeine [facility] HEADACHE PO) Take 1 packet by mouth every 8 (eight) hours as needed (for pain).    [provider]  cetirizine (ZYRTEC) 10 MG chewable tablet Chew 10 mg by mouth daily.    [provider]  dexamethasone (DECADRON) 2 MG tablet Begin 3 tablets daily, taper by one tablet each day until off 08/16/15   Kathrynn Ducking, MD  gabapentin (NEURONTIN) 800 MG tablet Take 800 mg by mouth 3 (three) times daily.    [provider]  hydrOXYzine (ATARAX/VISTARIL) 25 MG tablet Take 1  tablet (25 mg total) by mouth every 6 (six) hours as needed for anxiety. Patient taking differently: Take 50 mg by mouth every 8 (eight) hours as needed for anxiety.  12/01/14   Lindell Spar I, NP  ketorolac (TORADOL) 10 MG tablet Take 1 tablet (10 mg total) by mouth every 6 (six) hours as needed. 08/17/15   Kathrynn Ducking, MD  magnesium oxide (MAG-OX) 400 MG tablet Take 400 mg by mouth daily.    [provider]  propranolol (INDERAL) 40 MG tablet Take 1 tablet (40 mg total) by mouth 2 (two)  times daily. 11/14/15   Kathrynn Ducking, MD  sertraline (ZOLOFT) 50 MG tablet Take 100 mg by mouth at bedtime.     [provider]    Family History Family History  Problem Relation Age of Onset  . Heart disease Mother   . Stroke Mother   . Epilepsy Mother   . Heart disease Father   . Hypertension Father   . Melanoma Father   . Migraines Father   . Colon cancer Sister   . Colon cancer Cousin     Social History Social History   Tobacco Use  . Smoking status: Former Smoker    Packs/day: 1.00    Years: 35.00    Pack years: 35.00    Last attempt to quit: 03/20/2015    Years since quitting: 2.8  . Smokeless tobacco: Never Used  Substance Use Topics  . Alcohol use: Yes    Alcohol/week: 0.0 oz    Comment: 6 drinks per day prior to one month ago  . Drug use: Yes    Types: Marijuana     Allergies   Codeine; Depakote [divalproex sodium]; Migranal [dihydroergotamine]; and Topamax [topiramate]   Review of Systems Review of Systems  Constitutional: Negative for chills and fever.  HENT: Negative for sore throat.   Eyes: Negative for redness.  Respiratory: Negative for cough and shortness of breath.   Cardiovascular: Positive for chest pain.  Gastrointestinal: Positive for vomiting. Negative for abdominal pain.  Genitourinary: Negative for flank pain.  Musculoskeletal: Negative for back pain, neck pain and neck stiffness.  Skin: Negative for rash.  Neurological: Positive for headaches. Negative for numbness.  Hematological: Does not bruise/bleed easily.  Psychiatric/Behavioral: Negative for confusion.     Physical Exam Updated Vital Signs BP 137/90   Pulse (!) 108   Temp 98.1 F (36.7 C) (Oral)   Resp 18   Ht 1.829 m (6')   Wt 86.2 kg (190 lb)   SpO2 98%   BMI 25.77 kg/m   Physical Exam  Constitutional: He is oriented to person, place, and time. He appears well-developed and well-nourished. No distress.  HENT:  Head: Atraumatic.  Mouth/Throat:  Oropharynx is clear and moist.  No sinus or temporal tenderness.   Eyes: Conjunctivae and EOM are normal. Pupils are equal, round, and reactive to light.  Neck: Normal range of motion. Neck supple. No tracheal deviation present.  No stiffness or rigidity  Cardiovascular: Regular rhythm, normal heart sounds and intact distal pulses. Exam reveals no gallop and no friction rub.  No murmur heard. Pulmonary/Chest: Effort normal and breath sounds normal. No accessory muscle usage. No respiratory distress. He exhibits no tenderness.  Abdominal: Soft. Bowel sounds are normal. He exhibits no distension. There is no tenderness.  Genitourinary:  Genitourinary Comments: No cva tenderness  Musculoskeletal: He exhibits no edema.  Neurological: He is alert and oriented to person, place, and time.  Speech  clear/fluent. Motor intact bil, stre 5/5. sens grossly intact. Steady gait.   Skin: Skin is warm and dry. He is not diaphoretic.  Psychiatric:  Anxious.   Nursing note and vitals reviewed.    ED Treatments / Results  Labs (all labs ordered are listed, but only abnormal results are displayed) Results for orders placed or performed during the hospital encounter of 01/22/18  CBC  Result Value Ref Range   WBC 20.5 (H) 4.0 - 10.5 K/uL   RBC 4.70 4.22 - 5.81 MIL/uL   Hemoglobin 15.1 13.0 - 17.0 g/dL   HCT 43.4 39.0 - 52.0 %   MCV 92.3 78.0 - 100.0 fL   MCH 32.1 26.0 - 34.0 pg   MCHC 34.8 30.0 - 36.0 g/dL   RDW 12.8 11.5 - 15.5 %   Platelets 374 150 - 400 K/uL  I-stat troponin, ED  Result Value Ref Range   Troponin i, poc 0.00 0.00 - 0.08 ng/mL   Comment 3            EKG  EKG Interpretation  Date/Time:  Friday January 22 2018 07:27:37 EST Ventricular Rate:  109 PR Interval:  178 QRS Duration: 96 QT Interval:  346 QTC Calculation: 465 R Axis:   140 Text Interpretation:  Sinus tachycardia Right axis deviation Incomplete right bundle branch block Confirmed by Lajean Saver [phone]) on  01/22/2018 7:51:24 AM       Radiology No results found.  Procedures Procedures (including critical care time)  Medications Ordered in ED Medications  ondansetron (ZOFRAN) injection 4 mg (not administered)  LORazepam (ATIVAN) injection 1 mg (not administered)  sodium chloride 0.9 % bolus 1,000 mL (not administered)     Initial Impression / Assessment and Plan / ED Course  I have reviewed the triage vital signs and the nursing notes.  Pertinent labs & imaging results that were available during my care of the patient were reviewed by me and considered in my medical decision making (see chart for details).  Iv ns bolus. Labs. Cxr.  Labs reviewed - trop normal.   cxr reviewed - no pna.  Reviewed nursing notes and prior charts for additional history.   zofran iv for nausea. Ativan 1 mg iv for anxiety.  Pt c/o severe headache. Grabbing head. No neck stiffness or rigidity. No sinus or temporal tenderness. Imaging reviewed - neg for hem or acute process.  Iv ns bolus 1 liter. Decadron iv, benadryl iv. Reglan.  Recheck pt more comfortable appearing. Hr 88. rr 14. Tolerating po. No chest pain.  Patient currently appears stable for d/c.     Final Clinical Impressions(s) / ED Diagnoses   Final diagnoses:  None    ED Discharge Orders    None       Lajean Saver, MD 01/22/18 1437

## 2018-01-25 ENCOUNTER — Emergency Department (HOSPITAL_COMMUNITY)
Admission: EM | Admit: 2018-01-25 | Discharge: 2018-01-26 | Disposition: A | Discharge status: Home / Self Care | Payer: Medicare Other | Attending: Emergency Medicine | Admitting: Emergency Medicine

## 2018-01-25 ENCOUNTER — Emergency Department (HOSPITAL_COMMUNITY): Payer: Medicare Other

## 2018-01-25 ENCOUNTER — Encounter (HOSPITAL_COMMUNITY): Payer: Self-pay | Admitting: Emergency Medicine

## 2018-01-25 DIAGNOSIS — F1020 Alcohol dependence, uncomplicated: Secondary | ICD-10-CM | POA: Insufficient documentation

## 2018-01-25 DIAGNOSIS — S39012A Strain of muscle, fascia and tendon of lower back, initial encounter: Secondary | ICD-10-CM | POA: Diagnosis not present

## 2018-01-25 DIAGNOSIS — I10 Essential (primary) hypertension: Secondary | ICD-10-CM | POA: Insufficient documentation

## 2018-01-25 DIAGNOSIS — Y998 Other external cause status: Secondary | ICD-10-CM | POA: Insufficient documentation

## 2018-01-25 DIAGNOSIS — F419 Anxiety disorder, unspecified: Secondary | ICD-10-CM | POA: Insufficient documentation

## 2018-01-25 DIAGNOSIS — Y9389 Activity, other specified: Secondary | ICD-10-CM | POA: Diagnosis not present

## 2018-01-25 DIAGNOSIS — W01198A Fall on same level from slipping, tripping and stumbling with subsequent striking against other object, initial encounter: Secondary | ICD-10-CM | POA: Insufficient documentation

## 2018-01-25 DIAGNOSIS — S3992XA Unspecified injury of lower back, initial encounter: Secondary | ICD-10-CM | POA: Diagnosis present

## 2018-01-25 DIAGNOSIS — Z87891 Personal history of nicotine dependence: Secondary | ICD-10-CM | POA: Diagnosis not present

## 2018-01-25 DIAGNOSIS — F1410 Cocaine abuse, uncomplicated: Secondary | ICD-10-CM | POA: Insufficient documentation

## 2018-01-25 DIAGNOSIS — F10230 Alcohol dependence with withdrawal, uncomplicated: Secondary | ICD-10-CM | POA: Diagnosis present

## 2018-01-25 DIAGNOSIS — F332 Major depressive disorder, recurrent severe without psychotic features: Secondary | ICD-10-CM | POA: Diagnosis not present

## 2018-01-25 DIAGNOSIS — Z8673 Personal history of transient ischemic attack (TIA), and cerebral infarction without residual deficits: Secondary | ICD-10-CM | POA: Insufficient documentation

## 2018-01-25 DIAGNOSIS — Y92231 Patient bathroom in hospital as the place of occurrence of the external cause: Secondary | ICD-10-CM | POA: Diagnosis not present

## 2018-01-25 DIAGNOSIS — Z79899 Other long term (current) drug therapy: Secondary | ICD-10-CM | POA: Insufficient documentation

## 2018-01-25 LAB — COMPREHENSIVE METABOLIC PANEL
ALT: 19 U/L (ref 17–63)
AST: 19 U/L (ref 15–41)
Albumin: 4.4 g/dL (ref 3.5–5.0)
Alkaline Phosphatase: 75 U/L (ref 38–126)
Anion gap: 12 (ref 5–15)
BUN: 11 mg/dL (ref 6–20)
CO2: 25 mmol/L (ref 22–32)
Calcium: 9.9 mg/dL (ref 8.9–10.3)
Chloride: 103 mmol/L (ref 101–111)
Creatinine, Ser: 1.17 mg/dL (ref 0.61–1.24)
GFR calc Af Amer: >60 mL/min (ref >60)
GFR calc non Af Amer: >60 mL/min (ref >60)
Glucose, Bld: 90 mg/dL (ref 65–99)
Potassium: 3.2 mmol/L — ABNORMAL LOW (ref 3.5–5.1)
Sodium: 140 mmol/L (ref 135–145)
Total Bilirubin: 0.7 mg/dL (ref 0.3–1.2)
Total Protein: 7.7 g/dL (ref 6.5–8.1)

## 2018-01-25 LAB — RAPID URINE DRUG SCREEN, HOSP PERFORMED
Amphetamines: NOT DETECTED
Barbiturates: NOT DETECTED
Benzodiazepines: POSITIVE — AB
Cocaine: NOT DETECTED
Opiates: NOT DETECTED
Tetrahydrocannabinol: NOT DETECTED

## 2018-01-25 LAB — CBC
HCT: 44 % (ref 39.0–52.0)
Hemoglobin: 15.6 g/dL (ref 13.0–17.0)
MCH: 32.8 pg (ref 26.0–34.0)
MCHC: 35.5 g/dL (ref 30.0–36.0)
MCV: 92.4 fL (ref 78.0–100.0)
Platelets: 330 10*3/uL (ref 150–400)
RBC: 4.76 MIL/uL (ref 4.22–5.81)
RDW: 12.5 % (ref 11.5–15.5)
WBC: 12.4 10*3/uL — ABNORMAL HIGH (ref 4.0–10.5)

## 2018-01-25 LAB — ETHANOL: Alcohol, Ethyl (B): 139 mg/dL — ABNORMAL HIGH (ref <10)

## 2018-01-25 LAB — ACETAMINOPHEN LEVEL: Acetaminophen (Tylenol), Serum: <10 ug/mL — ABNORMAL LOW (ref 10–30)

## 2018-01-25 LAB — SALICYLATE LEVEL: Salicylate Lvl: <7 mg/dL (ref 2.8–30.0)

## 2018-01-25 MED ORDER — PROPRANOLOL HCL 20 MG PO TABS
20.0000 mg | ORAL_TABLET | Freq: Three times a day (TID) | ORAL | Status: DC
  Administered 2018-01-25 – 2018-01-26 (×3): 20 mg via ORAL
  Filled 2018-01-25 (×5): qty 1

## 2018-01-25 MED ORDER — LORAZEPAM 2 MG/ML IJ SOLN: 0.0000 mg | Freq: Two times a day (BID) | INTRAMUSCULAR | Status: DC

## 2018-01-25 MED ORDER — FOLIC ACID 1 MG PO TABS
1.0000 mg | ORAL_TABLET | Freq: Once | ORAL | Status: AC
  Administered 2018-01-25: 1 mg via ORAL
  Filled 2018-01-25: qty 1

## 2018-01-25 MED ORDER — LORAZEPAM 2 MG/ML IJ SOLN: 0.0000 mg | Freq: Four times a day (QID) | INTRAMUSCULAR | Status: DC

## 2018-01-25 MED ORDER — LORAZEPAM 1 MG PO TABS
0.0000 mg | ORAL_TABLET | Freq: Four times a day (QID) | ORAL | Status: DC
  Administered 2018-01-25: 2 mg via ORAL
  Filled 2018-01-25: qty 2

## 2018-01-25 MED ORDER — IBUPROFEN 800 MG PO TABS
800.0000 mg | ORAL_TABLET | Freq: Once | ORAL | Status: AC
  Administered 2018-01-25: 800 mg via ORAL
  Filled 2018-01-25: qty 1

## 2018-01-25 MED ORDER — LEVETIRACETAM 500 MG PO TABS
500.0000 mg | ORAL_TABLET | Freq: Two times a day (BID) | ORAL | Status: DC
  Administered 2018-01-25 – 2018-01-26 (×2): 500 mg via ORAL
  Filled 2018-01-25 (×2): qty 1

## 2018-01-25 MED ORDER — VITAMIN B-1 100 MG PO TABS
100.0000 mg | ORAL_TABLET | Freq: Every day | ORAL | Status: DC
  Administered 2018-01-25 – 2018-01-26 (×2): 100 mg via ORAL
  Filled 2018-01-25: qty 1

## 2018-01-25 MED ORDER — THIAMINE HCL 100 MG/ML IJ SOLN: 100.0000 mg | Freq: Every day | INTRAMUSCULAR | Status: DC

## 2018-01-25 MED ORDER — LORAZEPAM 1 MG PO TABS: 0.0000 mg | ORAL_TABLET | Freq: Two times a day (BID) | ORAL | Status: DC

## 2018-01-25 MED ORDER — VITAMIN B-1 100 MG PO TABS
100.0000 mg | ORAL_TABLET | Freq: Every day | ORAL | Status: DC
  Administered 2018-01-25 (×2): 100 mg via ORAL
  Filled 2018-01-25 (×2): qty 1

## 2018-01-25 NOTE — ED Notes (Signed)
Patient wanded by security. 

## 2018-01-25 NOTE — ED Provider Notes (Signed)
[facility] DEPT Provider Note   CSN: [id number] Arrival date & time: 01/25/18  1008     History   Chief Complaint Chief Complaint  Patient presents with  . Suicidal    HPI Spencer Mendoza is a 57 y.o. male with a h/o of substance abuse, depression, anxiety, CVA, HTN, epilespy who presents to the emergency department with a chief complaint of depression.  The patient reports worsening depressed mood over the last week.  He he also endorses SI, but states "I could never do it myself, but I would not move out of the way if the truck was going to run me over."  He denies HI, auditory or visual hallucinations.  He states that 5 days ago "I attempted to OD on cocaine. I tried to blow up my heart, but it didn't work."   He reports that he just left [facility] in [place] for alcohol and benzodiazepine detoxification.  He states that he last drink a 24 ounce beer just prior to arrival in the ED.  He states that he drank "a lot" yesterday.  He states that he has not used Xanax since leaving rehab and is only taking his home medications.  Denies other recreational or illicit drug use.  He also endorses hypersomnia, but thinks it may be related to his home dose of Seroquel.   He also complains of right-sided back pain after he fell against a sink earlier today in the emergency department he states that the right side of his back at the sink and he fell backwards.  He denies hitting his head, LOC, nausea, or emesis.  He denies chest pain, shortness of breath, fever, chills, abdominal pain, nausea, vomiting, or diarrhea.  The history is provided by the patient. No language interpreter was used.    Past Medical History:  Diagnosis Date  . Allergy   . Anxiety   . Chronic insomnia 06/06/2015  . Depression   . Epilepsy ([place])   . Hemiplegic migraine with status migrainosus 06/06/2015  . Hypertension   . Migraine   . Stroke [facility])    patient denies  . Substance  abuse [facility])     Patient Active Problem List   Diagnosis Date Noted  . Hemiplegic migraine with status migrainosus 06/06/2015  . Chronic insomnia 06/06/2015  . Cocaine abuse ([employer]) 05/22/2015  . Substance induced mood disorder ([place]) 05/22/2015  . Alcohol dependence with uncomplicated withdrawal ([place])   . Headache, migraine   . Migraines 11/29/2014  . Headache 08/08/2014  . Seizures ([place]) 01/09/2014  . Chronic low back pain 12/28/2013  . Hemiplegia, unspecified, affecting dominant side 12/06/2013  . CVA (cerebral infarction) 12/04/2013  . Leukocytosis 12/04/2013  . Acute Gastroenteritis 12/04/2013  . Hypertension 12/04/2013  . Hemiparesis ([place]) 12/04/2013  . Delirium tremens ([place]) 07/29/2013  . Alcohol withdrawal ([place]) 07/29/2013  . UGIB (upper gastrointestinal bleed) 07/29/2013  . Chest pain 07/28/2013    Past Surgical History:  Procedure Laterality Date  . APPENDECTOMY    . KNEE ARTHROSCOPY Right   . NOSE SURGERY         Home Medications    Prior to Admission medications   Medication Sig Start Date End Date Taking? Authorizing Provider  acetaminophen (TYLENOL) 325 MG tablet Take 650 mg by mouth daily as needed for mild pain.   Yes [provider]  ALPRAZolam (XANAX) 0.25 MG tablet Take 0.25 mg by mouth at bedtime as needed for anxiety.   Yes [provider]  aluminum-magnesium hydroxide-simethicone (MAALOX) 101-751-02 MG/5ML SUSP Take 30 mLs by mouth 3 (three) times daily as needed (INDIGESTION).   Yes [provider]  amitriptyline (ELAVIL) 100 MG tablet Take 150 mg by mouth at bedtime.   Yes [provider]  Bismuth Subsalicylate (KAOPECTATE PO) Take 30 mLs by mouth 3 (three) times daily as needed (DIARRHEA).   Yes [provider]  busPIRone (BUSPAR) 10 MG tablet Take 10 mg by mouth 3 (three) times daily.   Yes [provider]  FLUoxetine (PROZAC) 40 MG capsule Take 40 mg by mouth daily.   Yes [provider]  ibuprofen (ADVIL,MOTRIN) 200 MG tablet Take 800 mg by mouth daily as needed for headache.   Yes [provider]  levETIRAcetam (KEPPRA) 500 MG tablet Take 500 mg by mouth 2 (two) times daily.   Yes [provider]  Magnesium Hydroxide (MILK OF MAGNESIA PO) Take 30 mLs by mouth 3 (three) times daily as needed (CONSTIPATION).   Yes [provider]  propranolol (INDERAL) 20 MG tablet Take 20 mg by mouth 3 (three) times daily.   Yes [provider]  QUEtiapine (SEROQUEL) 100 MG tablet Take 100 mg by mouth 3 (three) times daily.   Yes [provider]  SUMAtriptan (IMITREX) 50 MG tablet Take 50 mg by mouth daily as needed (MIGRAINES).    Yes [provider]  thiamine (VITAMIN B-1) 100 MG tablet Take 100 mg by mouth daily.    Yes [provider]  amitriptyline (ELAVIL) 25 MG tablet Take 3 tablets (75 mg total) by mouth at bedtime. For sleep Patient not taking: Reported on 01/22/2018 11/14/15   Kathrynn Ducking, MD  hydrOXYzine (ATARAX/VISTARIL) 25 MG tablet Take 1 tablet (25 mg total) by mouth every 6 (six) hours as needed for anxiety. Patient not taking: Reported on 01/22/2018 12/01/14   Lindell Spar I, NP  magnesium oxide (MAG-OX) 400 MG tablet Take 400 mg by mouth daily.    [provider]  Multiple Vitamin (MULTIVITAMIN WITH MINERALS) TABS tablet Take 1 tablet by mouth daily.    [provider]  propranolol (INDERAL) 40 MG tablet Take 1 tablet (40 mg total) by mouth 2 (two) times daily. Patient not taking: Reported on 01/22/2018 11/14/15   Kathrynn Ducking, MD    Family History Family History  Problem Relation Age of Onset  . Heart disease Mother   . Stroke Mother   . Epilepsy Mother   . Heart disease Father   . Hypertension Father   . Melanoma Father   . Migraines Father   . Colon cancer Sister   . Colon cancer Cousin     Social History Social History   Tobacco Use  . Smoking status: Former Smoker     Packs/day: 1.00    Years: 35.00    Pack years: 35.00    Last attempt to quit: 03/20/2015    Years since quitting: 2.8  . Smokeless tobacco: Never Used  Substance Use Topics  . Alcohol use: Yes    Alcohol/week: 0.0 oz    Comment: 6 drinks per day prior to one month ago  . Drug use: Yes    Types: Marijuana     Allergies   Codeine; Depakote [divalproex sodium]; Migranal [dihydroergotamine]; and Topamax [topiramate]   Review of Systems Review of Systems  Constitutional: Negative for activity change, appetite change, chills and fever.  Respiratory: Negative for shortness of breath.   Cardiovascular: Negative for chest  pain.  Gastrointestinal: Negative for abdominal pain.  Genitourinary: Negative for dysuria.  Musculoskeletal: Positive for back pain. Negative for gait problem.  Skin: Negative for rash.  Allergic/Immunologic: Negative for immunocompromised state.  Neurological: Negative for dizziness, seizures, weakness, numbness and headaches.  Psychiatric/Behavioral: Positive for agitation, sleep disturbance and suicidal ideas. Negative for confusion, hallucinations and self-injury. The patient is not hyperactive.    Physical Exam Updated Vital Signs BP (!) 131/94   Pulse 77   Temp 97.6 F (36.4 C) (Oral)   Resp 18   SpO2 97%   Physical Exam  Constitutional: He appears well-developed. No distress.  HENT:  Head: Normocephalic.  Eyes: Conjunctivae are normal.  Neck: Neck supple.  Cardiovascular: Normal rate, regular rhythm, normal heart sounds and intact distal pulses. Exam reveals no gallop and no friction rub.  No murmur heard. Pulmonary/Chest: Effort normal. No stridor. No respiratory distress. He has no wheezes. He has no rales.  Abdominal: Soft. He exhibits no distension and no mass. There is no tenderness. There is no rebound and no guarding. No hernia.  Musculoskeletal: Normal range of motion. He exhibits tenderness. He exhibits no edema or deformity.  Tender to  palpation over the right lumbar musculature.  There is a 5 x 6 cm area of minimal redness over the area.  No edema or warmth.  No ecchymosis.  No left-sided tenderness.  No tenderness to the cervical, thoracic, or lumbar spinous processes.  Ambulatory without difficulty.  NVI.   Neurological: He is alert.  Skin: Skin is warm and dry. Capillary refill takes less than 2 seconds. He is not diaphoretic.  Psychiatric: He is agitated. He is not actively hallucinating. Thought content is not paranoid and not delusional. He exhibits a depressed mood. He expresses no homicidal ideation. He expresses no homicidal plans.  Nursing note and vitals reviewed.    ED Treatments / Results  Labs (all labs ordered are listed, but only abnormal results are displayed) Labs Reviewed  COMPREHENSIVE METABOLIC PANEL - Abnormal; Notable for the following components:      Result Value   Potassium 3.2 (*)    All other components within normal limits  ETHANOL - Abnormal; Notable for the following components:   Alcohol, Ethyl (B) 139 (*)    All other components within normal limits  ACETAMINOPHEN LEVEL - Abnormal; Notable for the following components:   Acetaminophen (Tylenol), Serum <10 (*)    All other components within normal limits  CBC - Abnormal; Notable for the following components:   WBC 12.4 (*)    All other components within normal limits  RAPID URINE DRUG SCREEN, HOSP PERFORMED - Abnormal; Notable for the following components:   Benzodiazepines POSITIVE (*)    All other components within normal limits  SALICYLATE LEVEL    EKG  EKG Interpretation None       Radiology Dg Chest 2 View  Result Date: 01/25/2018 CLINICAL DATA:  Right rib pain after fall EXAM: CHEST - 2 VIEW COMPARISON:  01/22/2018 FINDINGS: Normal heart size and mediastinal contours. Streaky opacities in the bilateral upper lobes that are new. In the setting of fall on the lung volumes this may be atelectasis, but need correlation for  pneumonia symptoms. No Kerley lines, effusion, or pneumothorax. No visible fracture. IMPRESSION: Streaky opacities in the upper lobes. In the setting of fall and low lung volumes this is most likely atelectasis. If there are infectious symptoms please note that atypical pneumonia would have the same radiographic appearance. Electronically Signed  By: Monte Fantasia M.D.   On: 01/25/2018 14:23    Procedures Procedures (including critical care time)  Medications Ordered in ED Medications  LORazepam (ATIVAN) injection 0-4 mg ( Intravenous See Alternative 01/25/18 1721)    Or  LORazepam (ATIVAN) tablet 0-4 mg (2 mg Oral Given 01/25/18 1721)  LORazepam (ATIVAN) injection 0-4 mg (not administered)    Or  LORazepam (ATIVAN) tablet 0-4 mg (not administered)  thiamine (VITAMIN B-1) tablet 100 mg (100 mg Oral Given 01/25/18 1721)    Or  thiamine (B-1) injection 100 mg ( Intravenous See Alternative 01/25/18 1721)  levETIRAcetam (KEPPRA) tablet 500 mg (not administered)  propranolol (INDERAL) tablet 20 mg (not administered)  thiamine (VITAMIN B-1) tablet 100 mg (not administered)  folic acid (FOLVITE) tablet 1 mg (not administered)  ibuprofen (ADVIL,MOTRIN) tablet 800 mg (800 mg Oral Given 01/25/18 1721)     Initial Impression / Assessment and Plan / ED Course  I have reviewed the triage vital signs and the nursing notes.  Pertinent labs & imaging results that were available during my care of the patient were reviewed by me and considered in my medical decision making (see chart for details).     57 year old male with a h/o of substance abuse, depression, anxiety, CVA, HTN, epilespy presenting with worsening depression, passive suicidal ideation, and recent alcohol use.  He also endorses right sided back pain that occurred earlier this afternoon after he fell backwards against a sink.  The patient was seen and evaluated along with Dr. Vanita Panda, attending physician.  UDS positive for benzodiazepines.   Alcohol 139.  CIWA protocol ordered.  Given back pain, CXR ordered to assess posterior ribs, which is unremarkable.  On physical exam, back pain consistent with musculoskeletal injury. He has no infectious symptoms. Pt medically cleared at this time. Psych hold orders and home med orders placed. TTS recommends overnight observation.  Folic acid and thiamine ordered.  Please see psych team notes for further documentation of care/dispo. Pt stable at time of med clearance.    Final Clinical Impressions(s) / ED Diagnoses   Final diagnoses:  Severe episode of recurrent major depressive disorder, without psychotic features ([place])  Alcohol use disorder, severe, dependence ([place])  Acute myofascial strain of lumbar region, initial encounter    ED Discharge Orders    None       Joanne Gavel, PA-C 01/25/18 1743    Carmin Muskrat, MD 01/26/18 1642

## 2018-01-25 NOTE — ED Notes (Signed)
Report called to [facility] RN

## 2018-01-25 NOTE — ED Notes (Signed)
Pt has $94 cash and orange pocket knife that is locked up with security

## 2018-01-25 NOTE — ED Notes (Signed)
Bed: [facility] Expected date:  Expected time:  Means of arrival:  Comments: 

## 2018-01-25 NOTE — ED Notes (Signed)
SBAR Report received from previous nurse. Pt received asleep on unit and unwilling to participate in assessment  current SI/ HI, A/V H, depression, anxiety, or pain at this time, and appears otherwise stable and free of distress. Pt reminded of camera surveillance, q 15 min rounds, and rules of the milieu. Will continue to assess.

## 2018-01-25 NOTE — ED Notes (Signed)
Pt given paper scrubs and instructed to change and put his belongings in patient bag provided.  Patient reports unable to give urine sample at this time

## 2018-01-25 NOTE — ED Notes (Signed)
Patient arrived to the unit alert and oriented.  Denies SI, HI, and AVH.  Patient acts unsure of why he came into hospital.  Reports he had one beer to drink today.  When asked if he was beginning to have withdrawal symptoms, patient did not answer.

## 2018-01-25 NOTE — ED Triage Notes (Signed)
Patient c/o SI that has gotten worse since last week. Just "have no desire to live anymore". States that he was in [facility] in [place] and got out last week and reports that the medications they have him on make the SI worse. States that he could never actually hurt himself, but if he was to be hit by traffic. Reports attempted last week by OD on cocaine "tried to blow up my heart but didn't work". Denies HI.

## 2018-01-25 NOTE — BH Assessment (Addendum)
Assessment Note  Spencer Mendoza is an 57 y.o. male that presents this date with passive S/I. Patient denies any plan or intent this date. Patient states he relapsed one day after returning from a 30 day treatment program ([facility]) in [place]. Patient stated he was discharged from that facility on 01/20/18 and relapsed on 01/21/18 reporting daily alcohol use since then. Patient states he has been consuming 4 to 6 12 oz beers daily since that date with last reported use this date when he stated he consumed 2 12 oz beers. Patient denies any current withdrawals and is time place oriented. Patient denies any H/I or AVH. Patient does not appear to be responding to any internal stimuli but is partially impaired at the time of the assessment with a noted BAL of 139. Patient denies any other illicit SA use but also tested positive this date for benzodiazepines. Patient stated he was discharged from [facility] with prescriptions to assist with ongoing depression although he reports he has not been compliant with that medication regimen for the last two days. Patient reports his symptoms "can't be controled" reporting ongoing feelings of worthlessness and guilt. Patient states prior to admission at [facility] he had been receiving services from [facility] who assisted with medication management. Patient denies any other OP providers. Per history patient was last seen in 2016 at [facility] presenting with similar symptoms and S/I. Patient denies any previous attempts or gestures stating "it is always on his mind" though. Patient is unclear in reference to what services he is here for. Patient states "just help with my life I guess." Per notes, patient states he "has no desire to live anymore". States that he was in [employer] in [place] and got out last week and reports that the medications they have him on make the SI worse. States that he could never actually hurt himself, and denies HI. Patient reports increased  stress lately due to family problems. Case was staffed with Reita Cliche DNP who recommended patient be monitored and observed for safety. Patient will be seen by psychiatry in the a.m.      Diagnosis: F33.2 MDD recurrent severe without psychotic features, Alcohol use   Past Medical History:  Past Medical History:  Diagnosis Date  . Allergy   . Anxiety   . Chronic insomnia 06/06/2015  . Depression   . Epilepsy ([place])   . Hemiplegic migraine with status migrainosus 06/06/2015  . Hypertension   . Migraine   . Stroke [facility])    patient denies  . Substance abuse [facility])     Past Surgical History:  Procedure Laterality Date  . APPENDECTOMY    . KNEE ARTHROSCOPY Right   . NOSE SURGERY      Family History:  Family History  Problem Relation Age of Onset  . Heart disease Mother   . Stroke Mother   . Epilepsy Mother   . Heart disease Father   . Hypertension Father   . Melanoma Father   . Migraines Father   . Colon cancer Sister   . Colon cancer Cousin     Social History:  reports that he quit smoking about 2 years ago. He has a 35.00 pack-year smoking history. he has never used smokeless tobacco. He reports that he drinks alcohol. He reports that he uses drugs. Drug: Marijuana.  Additional Social History:  Alcohol / Drug Use Pain Medications: see med list Prescriptions: see med list Over the Counter: see med list History of alcohol /  drug use?: Yes Longest period of sobriety (when/how long): several months 2016 Negative Consequences of Use: Personal relationships Withdrawal Symptoms: (Denies) Substance #1 Name of Substance 1: Alcohol 1 - Age of First Use: 16 1 - Amount (size/oz): Pt reports "different amounts" 1 - Frequency: Daily  1 - Duration: Pt returned from 30 days of treatment last week and relapsed that day  1 - Last Use / Amount: 01/25/18 6 12  oz beers  CIWA: CIWA-Ar BP: (!) 131/94 Pulse Rate: 77 COWS:    Allergies:  Allergies  Allergen Reactions  . Codeine Nausea  And Vomiting  . Depakote [Divalproex Sodium]     Made patient shake  . Migranal [Dihydroergotamine]     Nausea  . Topamax [Topiramate]     Made patient angry    Home Medications:  (Not in a hospital admission)  OB/GYN Status:  No LMP for male patient.  General Assessment Data Location of Assessment: [facility] TTS Assessment: In system Is this a Tele or Face-to-Face Assessment?: Face-to-Face Is this an Initial Assessment or a Re-assessment for this encounter?: Initial Assessment Marital status: Single Maiden name: NA Is patient pregnant?: No Pregnancy Status: No Living Arrangements: Other relatives(Sister) Can pt return to current living arrangement?: No Admission Status: Voluntary Is patient capable of signing voluntary admission?: Yes Referral Source: Self/Family/Friend Insurance type: Medicare  Medical Screening Exam ([place]) Medical Exam completed: Yes  Crisis Care Plan Living Arrangements: Other relatives(Sister) Legal Guardian: (NA) Name of Psychiatrist: [facility] Name of Therapist: None  Education Status Is patient currently in school?: No Is the patient employed, unemployed or receiving disability?: Unemployed  Risk to self with the past 6 months Suicidal Ideation: Yes-Currently Present Has patient been a risk to self within the past 6 months prior to admission? : No Suicidal Intent: No Has patient had any suicidal intent within the past 6 months prior to admission? : No Is patient at risk for suicide?: Yes Suicidal Plan?: No Has patient had any suicidal plan within the past 6 months prior to admission? : No Access to Means: No What has been your use of drugs/alcohol within the last 12 months?: Current use Previous Attempts/Gestures: No How many times?: 0 Other Self Harm Risks: NA Triggers for Past Attempts: Unknown Intentional Self Injurious Behavior: None Family Suicide History: No Recent stressful life event(s): Other (Comment)(Recent  relapse) Persecutory voices/beliefs?: No Depression: Yes Depression Symptoms: Feeling worthless/self pity Substance abuse history and/or treatment for substance abuse?: Yes Suicide prevention information given to non-admitted patients: Not applicable  Risk to Others within the past 6 months Homicidal Ideation: No Does patient have any lifetime risk of violence toward others beyond the six months prior to admission? : No Thoughts of Harm to Others: No Current Homicidal Intent: No Current Homicidal Plan: No Access to Homicidal Means: No Identified Victim: NA History of harm to others?: No Assessment of Violence: None Noted Violent Behavior Description: NA Does patient have access to weapons?: No Criminal Charges Pending?: No Does patient have a court date: No Is patient on probation?: No  Psychosis Hallucinations: None noted Delusions: None noted  Mental Status Report Appearance/Hygiene: In scrubs Eye Contact: Poor Motor Activity: Freedom of movement Speech: Slow, Slurred Level of Consciousness: Drowsy Mood: Depressed Affect: Flat Anxiety Level: Minimal Thought Processes: Thought Blocking Judgement: Partial Orientation: Person, Place, Time Obsessive Compulsive Thoughts/Behaviors: None  Cognitive Functioning Concentration: Decreased Memory: Recent Intact Is patient IDD: No Is patient DD?: No Insight: Fair Impulse Control: Poor Appetite: Fair Have you  had any weight changes? : No Change Sleep: Decreased Total Hours of Sleep: 5 Vegetative Symptoms: None  ADLScreening [facility] Assessment Services) Patient's cognitive ability adequate to safely complete daily activities?: Yes Patient able to express need for assistance with ADLs?: Yes Independently performs ADLs?: Yes (appropriate for developmental age)  Prior Inpatient Therapy Prior Inpatient Therapy: Yes Prior Therapy Dates: 2019 Prior Therapy Facilty/Provider(s): [facility] Reason for Treatment: SA  issues  Prior Outpatient Therapy Prior Outpatient Therapy: Yes Prior Therapy Dates: 2019 Prior Therapy Facilty/Provider(s): [facility] Reason for Treatment: SA issues Does patient have an ACCT team?: No Does patient have Intensive In-House Services?  : No Does patient have [facility] services? : No Does patient have P4CC services?: No  ADL Screening (condition at time of admission) Patient's cognitive ability adequate to safely complete daily activities?: Yes Is the patient deaf or have difficulty hearing?: No Does the patient have difficulty seeing, even when wearing glasses/contacts?: No Does the patient have difficulty concentrating, remembering, or making decisions?: No Patient able to express need for assistance with ADLs?: Yes Does the patient have difficulty dressing or bathing?: No Independently performs ADLs?: Yes (appropriate for developmental age) Does the patient have difficulty walking or climbing stairs?: No Weakness of Legs: None Weakness of Arms/Hands: None  Home Assistive Devices/Equipment Home Assistive Devices/Equipment: None  Therapy Consults (therapy consults require a physician order) PT Evaluation Needed: No OT Evalulation Needed: No SLP Evaluation Needed: No Abuse/Neglect Assessment (Assessment to be complete while patient is alone) Physical Abuse: Denies Verbal Abuse: Denies Sexual Abuse: Denies Exploitation of patient/patient's resources: Denies Self-Neglect: Denies Values / Beliefs Cultural Requests During Hospitalization: None Spiritual Requests During Hospitalization: None Consults Spiritual Care Consult Needed: No Social Work Consult Needed: No Regulatory affairs officer (For Healthcare) Does Patient Have a Medical Advance Directive?: No Would patient like information on creating a medical advance directive?: No - Patient declined    Additional Information 1:1 In Past 12 Months?: No CIRT Risk: No Elopement Risk: No Does patient have medical  clearance?: Yes     Disposition: Case was staffed with Reita Cliche DNP who recommended patient be monitored and observed for safety. Patient will be seen by psychiatry in the a.m.    Disposition Initial Assessment Completed for this Encounter: Yes Disposition of Patient: (Patient will be monitored and observed) Patient refused recommended treatment: No Mode of transportation if patient is discharged?: (Unknown) Patient referred to: (Pt will be monitored and observed)  On Site Evaluation by:   Reviewed with Physician:    Mamie Nick 01/25/2018 4:23 PM

## 2018-01-25 NOTE — BH Assessment (Signed)
[facility] Assessment Progress Note Case was staffed with Lord DNP who recommended patient be monitored and observed for safety. Patient will be seen by psychiatry in the a.m.        

## 2018-01-25 NOTE — ED Notes (Signed)
Patient walked with belongings to [facility]ppu

## 2018-01-25 NOTE — ED Notes (Signed)
Spencer Mendoza(sister) [phone]

## 2018-01-26 DIAGNOSIS — F10230 Alcohol dependence with withdrawal, uncomplicated: Secondary | ICD-10-CM | POA: Diagnosis not present

## 2018-01-26 DIAGNOSIS — S39012A Strain of muscle, fascia and tendon of lower back, initial encounter: Secondary | ICD-10-CM | POA: Diagnosis not present

## 2018-01-26 NOTE — BH Assessment (Signed)
[facility] Assessment Progress Note  Per Buford Dresser, DO, this pt does not require psychiatric hospitalization at this time.  Pt is to be discharged from [facility] with recommendation to follow up with [facility].  This has been included in pt's discharge instructions.  Pt's nurse, Malachi Paradise, has been notified.  Jalene Mullet, [place] Triage Specialist [phone]

## 2018-01-26 NOTE — ED Notes (Signed)
Pt denies SI/HI/AVH. Pt given discharge instructions including f/u appointments. Pt states understanding. Pt states receipt of all belongings.   

## 2018-01-26 NOTE — Consult Note (Addendum)
[place] Psychiatry Consult   Reason for Consult:  Detox from alcohol  Referring Physician:  EDP Patient Identification: Spencer Mendoza MRN:  [id number] Principal Diagnosis: Alcohol dependence with uncomplicated withdrawal [facility]) Diagnosis:   Patient Active Problem List   Diagnosis Date Noted  . Hemiplegic migraine with status migrainosus [G43.401] 06/06/2015  . Chronic insomnia [F51.04] 06/06/2015  . Cocaine abuse ([place]) [F14.10] 05/22/2015  . Substance induced mood disorder ([place]) [F19.94] 05/22/2015  . Alcohol dependence with uncomplicated withdrawal ([place]) [F10.230]   . Headache, migraine [G43.909]   . Migraines [G43.909] 11/29/2014  . Headache [R51] 08/08/2014  . Seizures ([place]) [R56.9] 01/09/2014  . Chronic low back pain [M54.5, G89.29] 12/28/2013  . Hemiplegia, unspecified, affecting dominant side [G81.90] 12/06/2013  . CVA (cerebral infarction) [I63.9] 12/04/2013  . Leukocytosis [D72.829] 12/04/2013  . Acute Gastroenteritis [K52.9] 12/04/2013  . Hypertension [I10] 12/04/2013  . Hemiparesis ([place]) [G81.90] 12/04/2013  . Delirium tremens ([place]) [F10.231] 07/29/2013  . Alcohol withdrawal ([place]) [F10.239] 07/29/2013  . UGIB (upper gastrointestinal bleed) [K92.2] 07/29/2013  . Chest pain [R07.9] 07/28/2013    Total Time spent with patient: 45 minutes  Subjective:   Spencer Mendoza is a 57 y.o. male patient admitted with *intoxicated and asking for detox.  HPI:  Per note on chart written by TTS:  Spencer Mendoza is an 57 y.o. male that presents this date with passive S/I. Patient denies any plan or intent this date. Patient states he relapsed one day after returning from a 30 day treatment program (Center) in [place]. Patient stated he was discharged from that facility on 01/20/18 and relapsed on 01/21/18 reporting daily alcohol use since then. Patient states he has been consuming 4 to 6 12 oz beers daily since that date with last reported use this date when he stated  he consumed 2 12 oz beers. Patient denies any current withdrawals and is time place oriented. Patient denies any H/I or AVH. Patient does not appear to be responding to any internal stimuli but is partially impaired at the time of the assessment with a noted BAL of 139. Patient denies any other illicit SA use but also tested positive this date for benzodiazepines.  Today Pt was seen and chart reviewed with treatment team and Dr Mariea Clonts. Pt stated that he is homeless and recently relapsed after being in substance abuse treatment in [place]. Pt declined offers of homeless shelter resources and outpatient substance abuse treatment facilities. Pt declined to speak with Peer Support regarding his drug and alcohol use. Pt's BAL 139, UDS positive for benzos. Pt denies suicidal/homicidal ideation, denies auditory/visual hallucinations and does not appear to be responding to internal stimuli. Pt was discharged from [facility] in [place] on a medication regimen to assist with depression but he has not been compliant. Pt could not name the medications. Pt is psychiatrically clear for discharge.   Past Psychiatric History: As above  Risk to Self: None Risk to Others:None Prior Inpatient Therapy: Prior Inpatient Therapy: Yes Prior Therapy Dates: 2019 Prior Therapy Facilty/Provider(s): [facility] Reason for Treatment: SA issues Prior Outpatient Therapy: Prior Outpatient Therapy: Yes Prior Therapy Dates: 2019 Prior Therapy Facilty/Provider(s): [facility] Reason for Treatment: SA issues Does patient have an ACCT team?: No Does patient have Intensive In-House Services?  : No Does patient have [facility] services? : No Does patient have P4CC services?: No  Past Medical History:  Past Medical History:  Diagnosis Date  . Allergy   . Anxiety   . Chronic insomnia 06/06/2015  .  Depression   . Epilepsy ([place])   . Hemiplegic migraine with status migrainosus 06/06/2015  . Hypertension   . Migraine   . Stroke [facility])     patient denies  . Substance abuse [facility])     Past Surgical History:  Procedure Laterality Date  . APPENDECTOMY    . KNEE ARTHROSCOPY Right   . NOSE SURGERY     Family History:  Family History  Problem Relation Age of Onset  . Heart disease Mother   . Stroke Mother   . Epilepsy Mother   . Heart disease Father   . Hypertension Father   . Melanoma Father   . Migraines Father   . Colon cancer Sister   . Colon cancer Cousin    Family Psychiatric  History: Unknown Social History:  Social History   Substance and Sexual Activity  Alcohol Use Yes  . Alcohol/week: 0.0 oz   Comment: 6 drinks per day prior to one month ago     Social History   Substance and Sexual Activity  Drug Use Yes  . Types: Marijuana    Social History   Socioeconomic History  . Marital status: Divorced    Spouse name: None  . Number of children: 0  . Years of education: GED  . Highest education level: None  Social Needs  . Financial resource strain: None  . Food insecurity - worry: None  . Food insecurity - inability: None  . Transportation needs - medical: None  . Transportation needs - non-medical: None  Occupational History  . Occupation: disabled  Tobacco Use  . Smoking status: Former Smoker    Packs/day: 1.00    Years: 35.00    Pack years: 35.00    Last attempt to quit: 03/20/2015    Years since quitting: 2.8  . Smokeless tobacco: Never Used  Substance and Sexual Activity  . Alcohol use: Yes    Alcohol/week: 0.0 oz    Comment: 6 drinks per day prior to one month ago  . Drug use: Yes    Types: Marijuana  . Sexual activity: No  Other Topics Concern  . None  Social History Narrative   Pt lives with significant other.    Patient drinks 2 cups of caffeine daily.   Patient is right handed.   Additional Social History: N/A    Allergies:   Allergies  Allergen Reactions  . Codeine Nausea And Vomiting  . Depakote [Divalproex Sodium]     Made patient shake  . Migranal  [Dihydroergotamine]     Nausea  . Topamax [Topiramate]     Made patient angry    Labs:  Results for orders placed or performed during the hospital encounter of 01/25/18 (from the past 48 hour(s))  Comprehensive metabolic panel     Status: Abnormal   Collection Time: 01/25/18 10:24 AM  Result Value Ref Range   Sodium 140 135 - 145 mmol/L   Potassium 3.2 (L) 3.5 - 5.1 mmol/L   Chloride 103 101 - 111 mmol/L   CO2 25 22 - 32 mmol/L   Glucose, Bld 90 65 - 99 mg/dL   BUN 11 6 - 20 mg/dL   Creatinine, Ser 1.17 0.61 - 1.24 mg/dL   Calcium 9.9 8.9 - 10.3 mg/dL   Total Protein 7.7 6.5 - 8.1 g/dL   Albumin 4.4 3.5 - 5.0 g/dL   AST 19 15 - 41 U/L   ALT 19 17 - 63 U/L   Alkaline Phosphatase 75 38 - 126  U/L   Total Bilirubin 0.7 0.3 - 1.2 mg/dL   GFR calc non Af Amer >60 >60 mL/min   GFR calc Af Amer >60 >60 mL/min    Comment: (NOTE) The eGFR has been calculated using the CKD EPI equation. This calculation has not been validated in all clinical situations. eGFR's persistently <60 mL/min signify possible Chronic Kidney Disease.    Anion gap 12 5 - 15    Comment: Performed at [facility], [place] [address] [place], [place] [address]  Ethanol     Status: Abnormal   Collection Time: 01/25/18 10:24 AM  Result Value Ref Range   Alcohol, Ethyl (B) 139 (H) <10 mg/dL    Comment:        LOWEST DETECTABLE LIMIT FOR SERUM ALCOHOL IS 10 mg/dL FOR MEDICAL PURPOSES ONLY Performed at [place] [address], [place], [place] [address]   Salicylate level     Status: None   Collection Time: 01/25/18 10:24 AM  Result Value Ref Range   Salicylate Lvl <1.2 2.8 - 30.0 mg/dL    Comment: Performed at [facility], [place] [address], [place], [place] [address]  Acetaminophen level     Status: Abnormal   Collection Time: 01/25/18 10:24 AM  Result Value Ref Range   Acetaminophen (Tylenol), Serum <10 (L) 10 - 30 ug/mL    Comment:         THERAPEUTIC CONCENTRATIONS VARY SIGNIFICANTLY. A RANGE OF 10-30 ug/mL MAY BE AN EFFECTIVE CONCENTRATION FOR MANY PATIENTS. HOWEVER, SOME ARE BEST TREATED AT CONCENTRATIONS OUTSIDE THIS RANGE. ACETAMINOPHEN CONCENTRATIONS >150 ug/mL AT 4 HOURS AFTER INGESTION AND >50 ug/mL AT 12 HOURS AFTER INGESTION ARE OFTEN ASSOCIATED WITH TOXIC REACTIONS. Performed at [facility], [place] [address], [place], [place] [address]   cbc     Status: Abnormal   Collection Time: 01/25/18 10:24 AM  Result Value Ref Range   WBC 12.4 (H) 4.0 - 10.5 K/uL   RBC 4.76 4.22 - 5.81 MIL/uL   Hemoglobin 15.6 13.0 - 17.0 g/dL   HCT 44.0 39.0 - 52.0 %   MCV 92.4 78.0 - 100.0 fL   MCH 32.8 26.0 - 34.0 pg   MCHC 35.5 30.0 - 36.0 g/dL   RDW 12.5 11.5 - 15.5 %   Platelets 330 150 - 400 K/uL    Comment: Performed at [facility], [place] [address], [place], [place] [address]  Rapid urine drug screen (hospital performed)     Status: Abnormal   Collection Time: 01/25/18 12:18 PM  Result Value Ref Range   Opiates NONE DETECTED NONE DETECTED   Cocaine NONE DETECTED NONE DETECTED   Benzodiazepines POSITIVE (A) NONE DETECTED   Amphetamines NONE DETECTED NONE DETECTED   Tetrahydrocannabinol NONE DETECTED NONE DETECTED   Barbiturates NONE DETECTED NONE DETECTED    Comment: (NOTE) DRUG SCREEN FOR MEDICAL PURPOSES ONLY.  IF CONFIRMATION IS NEEDED FOR ANY PURPOSE, NOTIFY LAB WITHIN 5 DAYS. LOWEST DETECTABLE LIMITS FOR URINE DRUG SCREEN Drug Class                     Cutoff (ng/mL) Amphetamine and metabolites    1000 Barbiturate and metabolites    200 Benzodiazepine                 976 Tricyclics and metabolites     300 Opiates and metabolites        300 Cocaine and metabolites  300 THC                            50 Performed at [facility], [place] [address], [place], [place] [address]     Current Facility-Administered Medications  Medication Dose  Route Frequency Provider Last Rate Last Dose  . levETIRAcetam (KEPPRA) tablet 500 mg  500 mg Oral BID McDonald, Mia A, PA-C   500 mg at 01/26/18 0920  . LORazepam (ATIVAN) injection 0-4 mg  0-4 mg Intravenous Q6H McDonald, Mia A, PA-C       Or  . LORazepam (ATIVAN) tablet 0-4 mg  0-4 mg Oral Q6H McDonald, Mia A, PA-C   2 mg at 01/25/18 1721  . [START ON 01/27/2018] LORazepam (ATIVAN) injection 0-4 mg  0-4 mg Intravenous Q12H McDonald, Mia A, PA-C       Or  . [START ON 01/27/2018] LORazepam (ATIVAN) tablet 0-4 mg  0-4 mg Oral Q12H McDonald, Mia A, PA-C      . propranolol (INDERAL) tablet 20 mg  20 mg Oral TID McDonald, Mia A, PA-C   20 mg at 01/26/18 0920  . thiamine (VITAMIN B-1) tablet 100 mg  100 mg Oral Daily McDonald, Mia A, PA-C   100 mg at 01/25/18 1811   Or  . thiamine (B-1) injection 100 mg  100 mg Intravenous Daily McDonald, Mia A, PA-C      . thiamine (VITAMIN B-1) tablet 100 mg  100 mg Oral Daily McDonald, Mia A, PA-C   100 mg at 01/26/18 0920   Current Outpatient Medications  Medication Sig Dispense Refill  . acetaminophen (TYLENOL) 325 MG tablet Take 650 mg by mouth daily as needed for mild pain.    [name] ALPRAZolam (XANAX) 0.25 MG tablet Take 0.25 mg by mouth at bedtime as needed for anxiety.    [name] aluminum-magnesium hydroxide-simethicone (MAALOX) [phone] MG/5ML SUSP Take 30 mLs by mouth 3 (three) times daily as needed (INDIGESTION).    [name] amitriptyline (ELAVIL) 100 MG tablet Take 150 mg by mouth at bedtime.    . Bismuth Subsalicylate (KAOPECTATE PO) Take 30 mLs by mouth 3 (three) times daily as needed (DIARRHEA).    . busPIRone (BUSPAR) 10 MG tablet Take 10 mg by mouth 3 (three) times daily.    [name] FLUoxetine (PROZAC) 40 MG capsule Take 40 mg by mouth daily.    [name] ibuprofen (ADVIL,MOTRIN) 200 MG tablet Take 800 mg by mouth daily as needed for headache.    . levETIRAcetam (KEPPRA) 500 MG tablet Take 500 mg by mouth 2 (two) times daily.    . Magnesium Hydroxide (MILK OF MAGNESIA PO)  Take 30 mLs by mouth 3 (three) times daily as needed (CONSTIPATION).    [name] propranolol (INDERAL) 20 MG tablet Take 20 mg by mouth 3 (three) times daily.    . QUEtiapine (SEROQUEL) 100 MG tablet Take 100 mg by mouth 3 (three) times daily.    . SUMAtriptan (IMITREX) 50 MG tablet Take 50 mg by mouth daily as needed (MIGRAINES).     [name] thiamine (VITAMIN B-1) 100 MG tablet Take 100 mg by mouth daily.     [name] amitriptyline (ELAVIL) 25 MG tablet Take 3 tablets (75 mg total) by mouth at bedtime. For sleep (Patient not taking: Reported on 01/22/2018) 90 tablet 0  . hydrOXYzine (ATARAX/VISTARIL) 25 MG tablet Take 1 tablet (25 mg total) by mouth every 6 (six) hours as needed for anxiety. (Patient not taking:  Reported on 01/22/2018) 30 tablet 0  . magnesium oxide (MAG-OX) 400 MG tablet Take 400 mg by mouth daily.    . Multiple Vitamin (MULTIVITAMIN WITH MINERALS) TABS tablet Take 1 tablet by mouth daily.    . propranolol (INDERAL) 40 MG tablet Take 1 tablet (40 mg total) by mouth 2 (two) times daily. (Patient not taking: Reported on 01/22/2018) 60 tablet 0    Musculoskeletal: Strength & Muscle Tone: within normal limits Gait & Station: normal Patient leans: N/A  Psychiatric Specialty Exam: Physical Exam  Nursing note and vitals reviewed. Constitutional: He is oriented to person, place, and time. He appears well-developed and well-nourished.  HENT:  Head: Normocephalic.  Neck: Normal range of motion.  Respiratory: Effort normal.  Musculoskeletal: Normal range of motion.  Neurological: He is alert and oriented to person, place, and time.  Psychiatric: His speech is normal and behavior is normal. Thought content normal. Cognition and memory are normal. He expresses impulsivity. He exhibits a depressed mood.    Review of Systems  Psychiatric/Behavioral: Positive for depression and substance abuse. Negative for hallucinations, memory loss and suicidal ideas. The patient is not nervous/anxious and does not have  insomnia.   All other systems reviewed and are negative.   Blood pressure 124/83, pulse 76, temperature 98.3 F (36.8 C), temperature source Oral, resp. rate 14, SpO2 93 %.There is no height or weight on file to calculate BMI.  General Appearance: Casual  Eye Contact:  Good  Speech:  Clear and Coherent and Normal Rate  Volume:  Decreased  Mood:  Depressed  Affect:  Congruent and Depressed  Thought Process:  Coherent, Goal Directed and Linear  Orientation:  Full (Time, Place, and Person)  Thought Content:  Logical  Suicidal Thoughts:  No  Homicidal Thoughts:  No  Memory:  Immediate;   Good Recent;   Good Remote;   Fair  Judgement:  Fair  Insight:  Lacking  Psychomotor Activity:  Normal  Concentration:  Concentration: Good and Attention Span: Good  Recall:  Good  Fund of Knowledge:  Good  Language:  Good  Akathisia:  No  Handed:  Right  AIMS (if indicated):   N/A  Assets:  Communication Skills Desire for Improvement  ADL's:  Intact  Cognition:  WNL  Sleep:   N/A     Treatment Plan Summary: Plan Alcohol dependence with uncomplicated withdrawal ([place])  Discharge Home Take all medications as prescribed Avoid the use of alcohol, prescription drugs that are not yours, and illicit drugs.  Disposition: No evidence of imminent risk to self or others at present.   Patient does not meet criteria for psychiatric inpatient admission. Supportive therapy provided about ongoing stressors.  Ethelene Hal, NP 01/26/2018 12:15 PM   Patient seen face-to-face for psychiatric evaluation, chart reviewed and case discussed with the physician extender and developed treatment plan. Reviewed the information documented and agree with the treatment plan.  Buford Dresser, DO 01/26/18 6:23 PM

## 2018-01-26 NOTE — Discharge Instructions (Signed)
For your mental health needs, you are advised to follow up with [facility].  New and returning patients are seen at their walk-in clinic.  Walk-in hours are Monday - Friday from 8:00 am - 3:00 pm.  Walk-in patients are seen on a first come, first served basis.  Try to arrive as early as possible for he best chance of being seen the same day: ° °     [facility] °     [address] °     [place], [place] [address] °     [phone] °

## 2018-01-26 NOTE — [facility] Suicide Risk Assessment (Cosign Needed)
Suicide Risk Assessment  Discharge Assessment   [facility] Discharge Suicide Risk Assessment   Principal Problem: Alcohol dependence with uncomplicated withdrawal [facility]) Discharge Diagnoses:  Patient Active Problem List   Diagnosis Date Noted  . Hemiplegic migraine with status migrainosus [G43.401] 06/06/2015  . Chronic insomnia [F51.04] 06/06/2015  . Cocaine abuse ([place]) [F14.10] 05/22/2015  . Substance induced mood disorder ([place]) [F19.94] 05/22/2015  . Alcohol dependence with uncomplicated withdrawal ([place]) [F10.230]   . Headache, migraine [G43.909]   . Migraines [G43.909] 11/29/2014  . Headache [R51] 08/08/2014  . Seizures ([place]) [R56.9] 01/09/2014  . Chronic low back pain [M54.5, G89.29] 12/28/2013  . Hemiplegia, unspecified, affecting dominant side [G81.90] 12/06/2013  . CVA (cerebral infarction) [I63.9] 12/04/2013  . Leukocytosis [D72.829] 12/04/2013  . Acute Gastroenteritis [K52.9] 12/04/2013  . Hypertension [I10] 12/04/2013  . Hemiparesis ([place]) [G81.90] 12/04/2013  . Delirium tremens ([place]) [F10.231] 07/29/2013  . Alcohol withdrawal ([place]) [F10.239] 07/29/2013  . UGIB (upper gastrointestinal bleed) [K92.2] 07/29/2013  . Chest pain [R07.9] 07/28/2013    Total Time spent with patient: 45 minutes  Musculoskeletal: Strength & Muscle Tone: within normal limits Gait & Station: normal Patient leans: N/A  Psychiatric Specialty Exam: Physical Exam  Constitutional: He is oriented to person, place, and time. He appears well-developed and well-nourished.  HENT:  Head: Normocephalic.  Respiratory: Effort normal.  Musculoskeletal: Normal range of motion.  Neurological: He is alert and oriented to person, place, and time.  Psychiatric: His speech is normal and behavior is normal. Thought content normal. Cognition and memory are normal. He expresses impulsivity. He exhibits a depressed mood.   Review of Systems  Psychiatric/Behavioral: Positive for depression and substance abuse. Negative  for hallucinations, memory loss and suicidal ideas. The patient is not nervous/anxious and does not have insomnia.   All other systems reviewed and are negative.  Blood pressure 124/83, pulse 76, temperature 98.3 F (36.8 C), temperature source Oral, resp. rate 14, SpO2 93 %.There is no height or weight on file to calculate BMI. General Appearance: Casual Eye Contact:  Good Speech:  Clear and Coherent and Normal Rate Volume:  Decreased Mood:  Depressed Affect:  Congruent and Depressed Thought Process:  Coherent, Goal Directed and Linear Orientation:  Full (Time, Place, and Person) Thought Content:  Logical Suicidal Thoughts:  No Homicidal Thoughts:  No Memory:  Immediate;   Good Recent;   Good Remote;   Fair Judgement:  Fair Insight:  Lacking Psychomotor Activity:  Normal Concentration:  Concentration: Good and Attention Span: Good Recall:  Good Fund of Knowledge:  Good Language:  Good Akathisia:  No Handed:  Right AIMS (if indicated):    Assets:  Communication Skills Desire for Improvement ADL's:  Intact Cognition:  WNL   Mental Status Per Nursing Assessment::   On Admission:   Intoxicated   Demographic Factors:  Male, Caucasian, Low socioeconomic status and Unemployed  Loss Factors: Financial problems/change in socioeconomic status  Historical Factors: Impulsivity  Risk Reduction Factors:   Sense of responsibility to family  Continued Clinical Symptoms:  Depression:   Impulsivity Alcohol/Substance Abuse/Dependencies  Cognitive Features That Contribute To Risk:  Closed-mindedness    Suicide Risk:  Minimal: No identifiable suicidal ideation.  Patients presenting with no risk factors but with morbid ruminations; may be classified as minimal risk based on the severity of the depressive symptoms    Plan Of Care/Follow-up recommendations:  Activity:  as tolerated Diet:  Heart Healthy  Ethelene Hal, NP 01/26/2018, 12:24 PM

## 2018-01-28 ENCOUNTER — Encounter (HOSPITAL_COMMUNITY): Payer: Self-pay | Admitting: Emergency Medicine

## 2018-01-28 ENCOUNTER — Emergency Department (HOSPITAL_COMMUNITY)
Admission: EM | Admit: 2018-01-28 | Discharge: 2018-01-29 | Disposition: A | Discharge status: Other Acute Inpatient Hospital | Payer: Medicare Other | Attending: Emergency Medicine | Admitting: Emergency Medicine

## 2018-01-28 ENCOUNTER — Other Ambulatory Visit: Payer: Self-pay

## 2018-01-28 ENCOUNTER — Ambulatory Visit (HOSPITAL_COMMUNITY)
Admission: EM | Admit: 2018-01-28 | Discharge: 2018-01-28 | Disposition: A | Discharge status: Home / Self Care | Payer: Medicare Other | Attending: Internal Medicine | Admitting: Internal Medicine

## 2018-01-28 DIAGNOSIS — F32A Depression, unspecified: Secondary | ICD-10-CM

## 2018-01-28 DIAGNOSIS — F332 Major depressive disorder, recurrent severe without psychotic features: Secondary | ICD-10-CM | POA: Insufficient documentation

## 2018-01-28 DIAGNOSIS — I10 Essential (primary) hypertension: Secondary | ICD-10-CM | POA: Insufficient documentation

## 2018-01-28 DIAGNOSIS — R45851 Suicidal ideations: Secondary | ICD-10-CM

## 2018-01-28 DIAGNOSIS — F17210 Nicotine dependence, cigarettes, uncomplicated: Secondary | ICD-10-CM | POA: Insufficient documentation

## 2018-01-28 DIAGNOSIS — M546 Pain in thoracic spine: Secondary | ICD-10-CM | POA: Diagnosis not present

## 2018-01-28 DIAGNOSIS — F1099 Alcohol use, unspecified with unspecified alcohol-induced disorder: Secondary | ICD-10-CM | POA: Insufficient documentation

## 2018-01-28 DIAGNOSIS — F329 Major depressive disorder, single episode, unspecified: Secondary | ICD-10-CM

## 2018-01-28 DIAGNOSIS — Z79899 Other long term (current) drug therapy: Secondary | ICD-10-CM | POA: Insufficient documentation

## 2018-01-28 DIAGNOSIS — F1910 Other psychoactive substance abuse, uncomplicated: Secondary | ICD-10-CM | POA: Insufficient documentation

## 2018-01-28 DIAGNOSIS — Z7982 Long term (current) use of aspirin: Secondary | ICD-10-CM | POA: Insufficient documentation

## 2018-01-28 LAB — COMPREHENSIVE METABOLIC PANEL
ALT: 14 U/L — ABNORMAL LOW (ref 17–63)
AST: 14 U/L — ABNORMAL LOW (ref 15–41)
Albumin: 3.9 g/dL (ref 3.5–5.0)
Alkaline Phosphatase: 67 U/L (ref 38–126)
Anion gap: 9 (ref 5–15)
BUN: 10 mg/dL (ref 6–20)
CO2: 27 mmol/L (ref 22–32)
Calcium: 9.6 mg/dL (ref 8.9–10.3)
Chloride: 101 mmol/L (ref 101–111)
Creatinine, Ser: 1.18 mg/dL (ref 0.61–1.24)
GFR calc Af Amer: >60 mL/min (ref >60)
GFR calc non Af Amer: >60 mL/min (ref >60)
Glucose, Bld: 91 mg/dL (ref 65–99)
Potassium: 3.8 mmol/L (ref 3.5–5.1)
Sodium: 137 mmol/L (ref 135–145)
Total Bilirubin: 0.6 mg/dL (ref 0.3–1.2)
Total Protein: 6.4 g/dL — ABNORMAL LOW (ref 6.5–8.1)

## 2018-01-28 LAB — ACETAMINOPHEN LEVEL: Acetaminophen (Tylenol), Serum: <10 ug/mL — ABNORMAL LOW (ref 10–30)

## 2018-01-28 LAB — CBC
HCT: 39.9 % (ref 39.0–52.0)
Hemoglobin: 13.9 g/dL (ref 13.0–17.0)
MCH: 32.1 pg (ref 26.0–34.0)
MCHC: 34.8 g/dL (ref 30.0–36.0)
MCV: 92.1 fL (ref 78.0–100.0)
Platelets: 300 10*3/uL (ref 150–400)
RBC: 4.33 MIL/uL (ref 4.22–5.81)
RDW: 12.8 % (ref 11.5–15.5)
WBC: 8.6 10*3/uL (ref 4.0–10.5)

## 2018-01-28 LAB — RAPID URINE DRUG SCREEN, HOSP PERFORMED
Amphetamines: NOT DETECTED
Barbiturates: NOT DETECTED
Benzodiazepines: POSITIVE — AB
Cocaine: POSITIVE — AB
Opiates: NOT DETECTED
Tetrahydrocannabinol: NOT DETECTED

## 2018-01-28 LAB — SALICYLATE LEVEL: Salicylate Lvl: <7 mg/dL (ref 2.8–30.0)

## 2018-01-28 LAB — ETHANOL: Alcohol, Ethyl (B): <10 mg/dL (ref <10)

## 2018-01-28 MED ORDER — ALUM & MAG HYDROXIDE-SIMETH 200-200-20 MG/5ML PO SUSP: 30.0000 mL | Freq: Four times a day (QID) | ORAL | Status: DC | PRN

## 2018-01-28 MED ORDER — IBUPROFEN 800 MG PO TABS
ORAL_TABLET | ORAL | Status: AC
  Filled 2018-01-28: qty 1

## 2018-01-28 MED ORDER — ZOLPIDEM TARTRATE 5 MG PO TABS
5.0000 mg | ORAL_TABLET | Freq: Every evening | ORAL | Status: DC | PRN
  Administered 2018-01-28: 5 mg via ORAL
  Filled 2018-01-28: qty 1

## 2018-01-28 MED ORDER — IBUPROFEN 800 MG PO TABS
800.0000 mg | ORAL_TABLET | Freq: Once | ORAL | Status: AC
  Administered 2018-01-28: 800 mg via ORAL

## 2018-01-28 MED ORDER — IBUPROFEN 400 MG PO TABS
600.0000 mg | ORAL_TABLET | Freq: Three times a day (TID) | ORAL | Status: DC | PRN
  Administered 2018-01-28 – 2018-01-29 (×2): 600 mg via ORAL
  Filled 2018-01-28 (×2): qty 1

## 2018-01-28 MED ORDER — CYCLOBENZAPRINE HCL 10 MG PO TABS: 10.0000 mg | ORAL_TABLET | Freq: Two times a day (BID) | ORAL | 0 refills | Status: DC | PRN

## 2018-01-28 MED ORDER — IBUPROFEN 800 MG PO TABS: 800.0000 mg | ORAL_TABLET | Freq: Three times a day (TID) | ORAL | 0 refills | Status: DC

## 2018-01-28 MED ORDER — ACETAMINOPHEN 325 MG PO TABS
650.0000 mg | ORAL_TABLET | Freq: Four times a day (QID) | ORAL | 0 refills | Status: DC | PRN
Start: 2018-01-28 — End: 2018-01-29

## 2018-01-28 MED ORDER — NICOTINE 21 MG/24HR TD PT24: 21.0000 mg | MEDICATED_PATCH | Freq: Every day | TRANSDERMAL | Status: DC

## 2018-01-28 MED ORDER — ONDANSETRON HCL 4 MG PO TABS: 4.0000 mg | ORAL_TABLET | Freq: Three times a day (TID) | ORAL | Status: DC | PRN

## 2018-01-28 NOTE — ED Notes (Signed)
Pt changing in wine colored scrubs.

## 2018-01-28 NOTE — ED Notes (Signed)
Pt made aware he needs to provide a UA. Verbalized understanding.

## 2018-01-28 NOTE — Discharge Instructions (Addendum)
Use anti-inflammatories for pain/swelling. You may take up to 800 mg Ibuprofen every 8 hours with food. You may supplement Ibuprofen with Tylenol [phone] mg every 8 hours.   Please also use flexeril every 12 hours as needed.   Apply ice and heat to back multiple times a day.  Please return if symptoms not improving in 1-2 weeks, symptoms worsening, develop numbness or tingling, loss of bowel or bladder control.

## 2018-01-28 NOTE — ED Notes (Signed)
Pt wants to call daughter in the morning so she can come get his car, which is in the parking deck. Says keys are with his belongings.

## 2018-01-28 NOTE — ED Notes (Signed)
Regular Diet was ordered for Dinner. 

## 2018-01-28 NOTE — BH Assessment (Addendum)
Tele Assessment Note   Patient Name: Spencer Mendoza MRN: [id number] Referring Physician: Wilson Singer Location of Patient: [facility] Location of Provider: [place]  Spencer Mendoza is a 57 y.o. male in ED due to [facility] w/ a plan to OD on heroin. Pt presents as very depressed, citing multiple physical and situational stressors to include homelessness, estrangement from his wife, lack of family support, his inability to stop drinking, extreme migraines, and Korsakoff syndrome. Pt indicates that he doesn't see the point of living, in light of the above mentioned stressors. Pt reports having been prescribed medications for depression and sleep from his most recent detox/rehab treatment at [facility]. He indicates that he has been compliant in taking these meds and feels like these meds may be increasing his SI. Pt denies HI, AVH.   Diagnosis: MDD, recurrent episode, severe; Alcohol Use d/o, severe  Past Medical History:  Past Medical History:  Diagnosis Date  . Allergy   . Anxiety   . Chronic insomnia 06/06/2015  . Depression   . Epilepsy ([place])   . Hemiplegic migraine with status migrainosus 06/06/2015  . Hypertension   . Migraine   . Stroke [facility])    patient denies  . Substance abuse [facility])     Past Surgical History:  Procedure Laterality Date  . APPENDECTOMY    . KNEE ARTHROSCOPY Right   . NOSE SURGERY      Family History:  Family History  Problem Relation Age of Onset  . Heart disease Mother   . Stroke Mother   . Epilepsy Mother   . Heart disease Father   . Hypertension Father   . Melanoma Father   . Migraines Father   . Colon cancer Sister   . Colon cancer Cousin     Social History:  reports that he has been smoking.  He has a 17.50 pack-year smoking history. he has never used smokeless tobacco. He reports that he drinks alcohol. He reports that he uses drugs.  Additional Social History:  Alcohol / Drug Use Pain Medications: see PTA  meds Prescriptions: see PTA meds Over the Counter: see PTA meds History of alcohol / drug use?: Yes(Pt has hx of heroin, xanax, morphine, and crack use) Longest period of sobriety (when/how long): several months 2016 Negative Consequences of Use: Personal relationships Substance #1 Name of Substance 1: Alcohol 1 - Age of First Use: 16 1 - Amount (size/oz): Pt reports "different amounts" 1 - Frequency: Daily  1 - Duration: Pt returned from 21 days of treatment last week and relapsed that day  1 - Last Use / Amount: 01/27/2018  CIWA: CIWA-Ar BP: 129/88 Pulse Rate: 66 COWS:    Allergies:  Allergies  Allergen Reactions  . Codeine Nausea And Vomiting  . Depakote [Divalproex Sodium]     Made patient shake  . Migranal [Dihydroergotamine]     Nausea  . Topamax [Topiramate]     Made patient angry    Home Medications:  (Not in a hospital admission)  OB/GYN Status:  No LMP for male patient.  General Assessment Data Location of Assessment: [facility] ED TTS Assessment: In system Is this a Tele or Face-to-Face Assessment?: Tele Assessment Is this an Initial Assessment or a Re-assessment for this encounter?: Initial Assessment Marital status: Separated Living Arrangements: Other (Comment)(homeless) Can pt return to current living arrangement?: Yes Admission Status: Voluntary Is patient capable of signing voluntary admission?: Yes Referral Source: Self/Family/Friend Insurance type: Medicare     [place]  Living Arrangements: Other (Comment)(homeless) Name of Psychiatrist: none Name of Therapist: none  Education Status Is patient currently in school?: No Is the patient employed, unemployed or receiving disability?: Receiving disability income, Unemployed  Risk to self with the past 6 months Suicidal Ideation: Yes-Currently Present Has patient been a risk to self within the past 6 months prior to admission? : No Suicidal Intent: Yes-Currently Present Has patient had any  suicidal intent within the past 6 months prior to admission? : No Is patient at risk for suicide?: Yes Suicidal Plan?: Yes-Currently Present Has patient had any suicidal plan within the past 6 months prior to admission? : No Specify Current Suicidal Plan: OD on drugs (heroin, specifically) Access to Means: Yes Specify Access to Suicidal Means: drugs What has been your use of drugs/alcohol within the last 12 months?: long hx of polysubstance use Previous Attempts/Gestures: No Intentional Self Injurious Behavior: None Family Suicide History: No Recent stressful life event(s): Financial Problems, Other (Comment) Persecutory voices/beliefs?: No Depression: Yes Depression Symptoms: Feeling worthless/self pity, Feeling angry/irritable Substance abuse history and/or treatment for substance abuse?: Yes Suicide prevention information given to non-admitted patients: Not applicable  Risk to Others within the past 6 months Homicidal Ideation: No Does patient have any lifetime risk of violence toward others beyond the six months prior to admission? : No Thoughts of Harm to Others: No Current Homicidal Intent: No Current Homicidal Plan: No Access to Homicidal Means: No History of harm to others?: No Assessment of Violence: None Noted Does patient have access to weapons?: No Criminal Charges Pending?: No Does patient have a court date: No Is patient on probation?: No  Psychosis Hallucinations: None noted Delusions: None noted  Mental Status Report Appearance/Hygiene: Unremarkable Eye Contact: Good Motor Activity: Unremarkable Speech: Logical/coherent Level of Consciousness: Alert Mood: Irritable, Depressed Affect: Appropriate to circumstance Anxiety Level: Minimal Thought Processes: Coherent, Relevant Judgement: Partial Orientation: Person, Place, Time, Situation Obsessive Compulsive Thoughts/Behaviors: None  Cognitive Functioning Concentration: Normal Memory: Recent Impaired,  Remote Impaired Is patient IDD: No Is patient DD?: No Insight: Fair Impulse Control: Fair Appetite: Fair Have you had any weight changes? : No Change Sleep: No Change Vegetative Symptoms: None  ADLScreening [facility] Services) Patient's cognitive ability adequate to safely complete daily activities?: Yes Patient able to express need for assistance with ADLs?: Yes Independently performs ADLs?: Yes (appropriate for developmental age)  Prior Inpatient Therapy Prior Inpatient Therapy: Yes Prior Therapy Dates: 2019; 2016 Prior Therapy Facilty/Provider(s): Database administrator (rehab); [facility] Reason for Treatment: addiction; depression  Prior Outpatient Therapy Prior Outpatient Therapy: Yes Prior Therapy Facilty/Provider(s): [facility] Reason for Treatment: SA issues Does patient have an ACCT team?: No Does patient have Intensive In-House Services?  : No Does patient have [facility] services? : No Does patient have P4CC services?: No  ADL Screening (condition at time of admission) Patient's cognitive ability adequate to safely complete daily activities?: Yes Is the patient deaf or have difficulty hearing?: No Does the patient have difficulty seeing, even when wearing glasses/contacts?: No Does the patient have difficulty concentrating, remembering, or making decisions?: No Patient able to express need for assistance with ADLs?: Yes Does the patient have difficulty dressing or bathing?: No Independently performs ADLs?: Yes (appropriate for developmental age) Does the patient have difficulty walking or climbing stairs?: No Weakness of Legs: None Weakness of Arms/Hands: None  Home Assistive Devices/Equipment Home Assistive Devices/Equipment: None    Abuse/Neglect Assessment (Assessment to be complete while patient is alone) Physical Abuse: Denies Verbal Abuse: Denies Sexual  Abuse: Denies Exploitation of patient/patient's resources: Denies Self-Neglect: Denies Values /  Beliefs Cultural Requests During Hospitalization: None Spiritual Requests During Hospitalization: None   Advance Directives (For Healthcare) Does Patient Have a Medical Advance Directive?: No Would patient like information on creating a medical advance directive?: No - Patient declined          Disposition: Pt is recommended for Inpatient treatment. Disposition Initial Assessment Completed for this Encounter: Yes(staffed with Earleen Newport, NP)  This service was provided via telemedicine using a 2-way, interactive audio and video technology.  Names of all persons participating in this telemedicine service and their role in this encounter.   Rexene Edison 01/28/2018 6:34 PM

## 2018-01-28 NOTE — ED Notes (Signed)
Pt roomed to A09 in order to be assessed. TTS in room.

## 2018-01-28 NOTE — ED Notes (Signed)
Night time snack given;[place], graham cracker x 2 packs, peanut butter x2, and short bread cookies x1

## 2018-01-28 NOTE — ED Provider Notes (Signed)
[facility] EMERGENCY DEPARTMENT Provider Note   CSN: [id number] Arrival date & time: 01/28/18  1120     History   Chief Complaint Chief Complaint  Patient presents with  . Suicidal    HPI Spencer Mendoza is a 57 y.o. male who presents emergency department with chief complaint of suicidal ideation.  Patient states that he has been depressed for about 6 months.  Over the past 3 days he has been thinking about killing himself.  He thought he might overdose on drugs.  Is a history of alcohol abuse and occasionally uses crack cocaine.  He states that his last use was about 4 days ago.  He had a beer yesterday.  Patient states that he has a history of Korsakoff syndrome and problems of memory.  He is currently estranged from his wife and is homeless and feels hopeless.  He denies any previous history of depression, previous history of psychiatric hospitalizations.  He denies audiovisual hallucinations or homicidal ideation.  HPI  Past Medical History:  Diagnosis Date  . Allergy   . Anxiety   . Chronic insomnia 06/06/2015  . Depression   . Epilepsy ([place])   . Hemiplegic migraine with status migrainosus 06/06/2015  . Hypertension   . Migraine   . Stroke [facility])    patient denies  . Substance abuse [facility])     Patient Active Problem List   Diagnosis Date Noted  . Hemiplegic migraine with status migrainosus 06/06/2015  . Chronic insomnia 06/06/2015  . Cocaine abuse ([place]) 05/22/2015  . Substance induced mood disorder ([place]) 05/22/2015  . Alcohol dependence with uncomplicated withdrawal ([place])   . Headache, migraine   . Migraines 11/29/2014  . Headache 08/08/2014  . Seizures ([place]) 01/09/2014  . Chronic low back pain 12/28/2013  . Hemiplegia, unspecified, affecting dominant side 12/06/2013  . CVA (cerebral infarction) 12/04/2013  . Leukocytosis 12/04/2013  . Acute Gastroenteritis 12/04/2013  . Hypertension 12/04/2013  . Hemiparesis ([place]) 12/04/2013  . Delirium  tremens ([place]) 07/29/2013  . Alcohol withdrawal ([place]) 07/29/2013  . UGIB (upper gastrointestinal bleed) 07/29/2013  . Chest pain 07/28/2013    Past Surgical History:  Procedure Laterality Date  . APPENDECTOMY    . KNEE ARTHROSCOPY Right   . NOSE SURGERY         Home Medications    Prior to Admission medications   Medication Sig Start Date End Date Taking? Authorizing Provider  acetaminophen (TYLENOL) 325 MG tablet Take 2 tablets (650 mg total) by mouth every 6 (six) hours as needed. 01/28/18  Yes Wieters, Hallie C, PA-C  ALPRAZolam (XANAX) 0.25 MG tablet Take 0.25 mg by mouth at bedtime.    Yes [provider]  aluminum-magnesium hydroxide-simethicone (MAALOX) 200-200-20 MG/5ML SUSP Take 30 mLs by mouth 3 (three) times daily as needed (INDIGESTION).   Yes [provider]  amitriptyline (ELAVIL) 25 MG tablet Take 3 tablets (75 mg total) by mouth at bedtime. For sleep Patient taking differently: Take 150 mg by mouth at bedtime. For sleep 11/14/15  Yes Kathrynn Ducking, MD  Aspirin-Salicylamide-Caffeine [facility] HEADACHE POWDER PO) Take 1 Package by mouth as needed.   Yes [provider]  Bismuth Subsalicylate (KAOPECTATE PO) Take 30 mLs by mouth 3 (three) times daily as needed (DIARRHEA).   Yes [provider]  busPIRone (BUSPAR) 10 MG tablet Take 10 mg by mouth 3 (three) times daily.   Yes [provider]  cyclobenzaprine (FLEXERIL) 10 MG tablet Take 1 tablet (10 mg  total) by mouth 2 (two) times daily as needed for up to 10 days for muscle spasms. 01/28/18 02/07/18 Yes Wieters, Hallie C, PA-C  FLUoxetine (PROZAC) 40 MG capsule Take 40 mg by mouth daily.   Yes [provider]  ibuprofen (ADVIL,MOTRIN) 800 MG tablet Take 1 tablet (800 mg total) by mouth 3 (three) times daily. Patient taking differently: Take 800 mg by mouth every 6 (six) hours as needed.  01/28/18  Yes Wieters, Hallie C, PA-C  levETIRAcetam (KEPPRA) 500 MG tablet Take 500 mg  by mouth 2 (two) times daily.   Yes [provider]  Magnesium Hydroxide (MILK OF MAGNESIA PO) Take 30 mLs by mouth 3 (three) times daily as needed (CONSTIPATION).   Yes [provider]  propranolol (INDERAL) 20 MG tablet Take 20 mg by mouth 3 (three) times daily.   Yes [provider]  QUEtiapine (SEROQUEL) 100 MG tablet Take 100 mg by mouth See admin instructions. Take 100 mg in the morning at 12:00 and at 1600. 300 mg at 20:00   Yes [provider]  SUMAtriptan (IMITREX) 50 MG tablet Take 50 mg by mouth daily as needed (MIGRAINES).    Yes [provider]  thiamine (VITAMIN B-1) 100 MG tablet Take 100 mg by mouth daily.    Yes [provider]  hydrOXYzine (ATARAX/VISTARIL) 25 MG tablet Take 1 tablet (25 mg total) by mouth every 6 (six) hours as needed for anxiety. Patient not taking: Reported on 01/22/2018 12/01/14   Lindell Spar I, NP  propranolol (INDERAL) 40 MG tablet Take 1 tablet (40 mg total) by mouth 2 (two) times daily. Patient not taking: Reported on 01/22/2018 11/14/15   Kathrynn Ducking, MD    Family History Family History  Problem Relation Age of Onset  . Heart disease Mother   . Stroke Mother   . Epilepsy Mother   . Heart disease Father   . Hypertension Father   . Melanoma Father   . Migraines Father   . Colon cancer Sister   . Colon cancer Cousin     Social History Social History   Tobacco Use  . Smoking status: Current Every Day Smoker    Packs/day: 0.50    Years: 35.00    Pack years: 17.50    Last attempt to quit: 03/20/2015    Years since quitting: 2.8  . Smokeless tobacco: Never Used  Substance Use Topics  . Alcohol use: Yes    Alcohol/week: 0.0 oz    Comment: 6 drinks per day prior to one month ago  . Drug use: Yes    Comment: crack, heroin     Allergies   Codeine; Depakote [divalproex sodium]; Migranal [dihydroergotamine]; and Topamax [topiramate]   Review of Systems Review of Systems  Ten  systems reviewed and are negative for acute change, except as noted in the HPI.   Physical Exam Updated Vital Signs BP 129/88 (BP Location: Right Arm)   Pulse 66   Temp 97.6 F (36.4 C) (Oral)   Resp 16   Ht 6' (1.829 m)   Wt 83.5 kg (184 lb)   SpO2 100%   BMI 24.95 kg/m   Physical Exam  Constitutional: He is oriented to person, place, and time. He appears well-developed and well-nourished. No distress.  HENT:  Head: Normocephalic and atraumatic.  Eyes: Conjunctivae are normal. No scleral icterus.  Neck: Normal range of motion. Neck supple.  Cardiovascular: Normal rate, regular rhythm and normal heart sounds.  Pulmonary/Chest: Effort normal  and breath sounds normal. No respiratory distress.  Abdominal: Soft. There is no tenderness.  Musculoskeletal: He exhibits no edema.  Neurological: He is alert and oriented to person, place, and time.  Skin: Skin is warm and dry. He is not diaphoretic.  Psychiatric: His behavior is normal.  tearful  Nursing note and vitals reviewed.    ED Treatments / Results  Labs (all labs ordered are listed, but only abnormal results are displayed) Labs Reviewed  COMPREHENSIVE METABOLIC PANEL - Abnormal; Notable for the following components:      Result Value   Total Protein 6.4 (*)    AST 14 (*)    ALT 14 (*)    All other components within normal limits  ACETAMINOPHEN LEVEL - Abnormal; Notable for the following components:   Acetaminophen (Tylenol), Serum <10 (*)    All other components within normal limits  RAPID URINE DRUG SCREEN, HOSP PERFORMED - Abnormal; Notable for the following components:   Cocaine POSITIVE (*)    Benzodiazepines POSITIVE (*)    All other components within normal limits  ETHANOL  SALICYLATE LEVEL  CBC    EKG  EKG Interpretation None       Radiology No results found.  Procedures Procedures (including critical care time)  Medications Ordered in ED Medications  zolpidem (AMBIEN) tablet 5 mg (not  administered)  ondansetron (ZOFRAN) tablet 4 mg (not administered)  alum & mag hydroxide-simeth (MAALOX/MYLANTA) 200-200-20 MG/5ML suspension 30 mL (not administered)  nicotine (NICODERM CQ - dosed in mg/24 hours) patch 21 mg (not administered)  ibuprofen (ADVIL,MOTRIN) tablet 600 mg (not administered)     Initial Impression / Assessment and Plan / ED Course  I have reviewed the triage vital signs and the nursing notes.  Pertinent labs & imaging results that were available during my care of the patient were reviewed by me and considered in my medical decision making (see chart for details).     Patient with suicidal ideation, depression.  Is medically clear for psychiatric evaluation.  Final Clinical Impressions(s) / ED Diagnoses   Final diagnoses:  Depression, unspecified depression type  Suicidal ideation  Substance abuse [facility])    ED Discharge Orders    None       Margarita Mail, PA-C 01/28/18 1753    Virgel Manifold, MD 01/29/18 [phone]

## 2018-01-28 NOTE — ED Triage Notes (Signed)
Patient complains of suicidal ideation, plan to overdose on heroin and/or amitriptyline. States he has felt depressed for a long time and "wishes it would all just end". Patient also complains of right sided rib pain after falling in the bathroom and hitting his ribs on the sink. Patient alert and oriented.

## 2018-01-28 NOTE — ED Notes (Signed)
Pt belongings in locker 3.  

## 2018-01-28 NOTE — ED Triage Notes (Addendum)
Patient says he slipped on Tuesday and fell backwards into a cabinet.  Feels pain and a "click" with twisting of torso.  Pain is in right mid back.

## 2018-01-28 NOTE — ED Provider Notes (Signed)
[place]    CSN: [id number] Arrival date & time: 01/28/18  1004     History   Chief Complaint Chief Complaint  Patient presents with  . Back Pain    HPI Spencer Mendoza is a 57 y.o. male history of depression, hypertension, substance abuse presenting today with right-sided back pain.  States that 2 days ago while he was in the emergency room he fell against a sink, and immediately had pain.  X-rays obtained in the emergency room afterwards showed no rib fractures or pneumothorax.  Did show some atelectasis versus atypical pneumonia.  Given injury, was attributed to atelectasis, and patient is without any cough, fever or infectious symptoms.  Denies loss of bowel or bladder control, denies any numbness or tingling.  Pain is located on right side lower thoracic area.  Does endorse difficulty breathing, pain increasing with breathing.   He does endorse feeling a clicking sensation when he turns his back.  HPI  Past Medical History:  Diagnosis Date  . Allergy   . Anxiety   . Chronic insomnia 06/06/2015  . Depression   . Epilepsy ([place])   . Hemiplegic migraine with status migrainosus 06/06/2015  . Hypertension   . Migraine   . Stroke [facility])    patient denies  . Substance abuse [facility])     Patient Active Problem List   Diagnosis Date Noted  . Hemiplegic migraine with status migrainosus 06/06/2015  . Chronic insomnia 06/06/2015  . Cocaine abuse ([place]) 05/22/2015  . Substance induced mood disorder ([place]) 05/22/2015  . Alcohol dependence with uncomplicated withdrawal ([place])   . Headache, migraine   . Migraines 11/29/2014  . Headache 08/08/2014  . Seizures ([place]) 01/09/2014  . Chronic low back pain 12/28/2013  . Hemiplegia, unspecified, affecting dominant side 12/06/2013  . CVA (cerebral infarction) 12/04/2013  . Leukocytosis 12/04/2013  . Acute Gastroenteritis 12/04/2013  . Hypertension 12/04/2013  . Hemiparesis ([place]) 12/04/2013  . Delirium tremens ([place]) 07/29/2013    . Alcohol withdrawal ([place]) 07/29/2013  . UGIB (upper gastrointestinal bleed) 07/29/2013  . Chest pain 07/28/2013    Past Surgical History:  Procedure Laterality Date  . APPENDECTOMY    . KNEE ARTHROSCOPY Right   . NOSE SURGERY         Home Medications    Prior to Admission medications   Medication Sig Start Date End Date Taking? Authorizing Provider  acetaminophen (TYLENOL) 325 MG tablet Take 2 tablets (650 mg total) by mouth every 6 (six) hours as needed. 01/28/18   Wieters, Hallie C, PA-C  ALPRAZolam (XANAX) 0.25 MG tablet Take 0.25 mg by mouth at bedtime as needed for anxiety.    [provider]  aluminum-magnesium hydroxide-simethicone (MAALOX) 200-200-20 MG/5ML SUSP Take 30 mLs by mouth 3 (three) times daily as needed (INDIGESTION).    [provider]  amitriptyline (ELAVIL) 100 MG tablet Take 150 mg by mouth at bedtime.    [provider]  amitriptyline (ELAVIL) 25 MG tablet Take 3 tablets (75 mg total) by mouth at bedtime. For sleep Patient not taking: Reported on 01/22/2018 11/14/15   Kathrynn Ducking, MD  Bismuth Subsalicylate (KAOPECTATE PO) Take 30 mLs by mouth 3 (three) times daily as needed (DIARRHEA).    [provider]  busPIRone (BUSPAR) 10 MG tablet Take 10 mg by mouth 3 (three) times daily.    [provider]  cyclobenzaprine (FLEXERIL) 10 MG tablet Take 1 tablet (10 mg total) by mouth 2 (two) times daily as needed  for up to 10 days for muscle spasms. 01/28/18 02/07/18  Wieters, Hallie C, PA-C  FLUoxetine (PROZAC) 40 MG capsule Take 40 mg by mouth daily.    [provider]  hydrOXYzine (ATARAX/VISTARIL) 25 MG tablet Take 1 tablet (25 mg total) by mouth every 6 (six) hours as needed for anxiety. Patient not taking: Reported on 01/22/2018 12/01/14   Lindell Spar I, NP  ibuprofen (ADVIL,MOTRIN) 800 MG tablet Take 1 tablet (800 mg total) by mouth 3 (three) times daily. 01/28/18   Wieters, Hallie C, PA-C  levETIRAcetam  (KEPPRA) 500 MG tablet Take 500 mg by mouth 2 (two) times daily.    [provider]  Magnesium Hydroxide (MILK OF MAGNESIA PO) Take 30 mLs by mouth 3 (three) times daily as needed (CONSTIPATION).    [provider]  magnesium oxide (MAG-OX) 400 MG tablet Take 400 mg by mouth daily.    [provider]  Multiple Vitamin (MULTIVITAMIN WITH MINERALS) TABS tablet Take 1 tablet by mouth daily.    [provider]  propranolol (INDERAL) 20 MG tablet Take 20 mg by mouth 3 (three) times daily.    [provider]  propranolol (INDERAL) 40 MG tablet Take 1 tablet (40 mg total) by mouth 2 (two) times daily. Patient not taking: Reported on 01/22/2018 11/14/15   Kathrynn Ducking, MD  QUEtiapine (SEROQUEL) 100 MG tablet Take 100 mg by mouth 3 (three) times daily.    [provider]  SUMAtriptan (IMITREX) 50 MG tablet Take 50 mg by mouth daily as needed (MIGRAINES).     [provider]  thiamine (VITAMIN B-1) 100 MG tablet Take 100 mg by mouth daily.     [provider]    Family History Family History  Problem Relation Age of Onset  . Heart disease Mother   . Stroke Mother   . Epilepsy Mother   . Heart disease Father   . Hypertension Father   . Melanoma Father   . Migraines Father   . Colon cancer Sister   . Colon cancer Cousin     Social History Social History   Tobacco Use  . Smoking status: Former Smoker    Packs/day: 1.00    Years: 35.00    Pack years: 35.00    Last attempt to quit: 03/20/2015    Years since quitting: 2.8  . Smokeless tobacco: Never Used  Substance Use Topics  . Alcohol use: Yes    Alcohol/week: 0.0 oz    Comment: 6 drinks per day prior to one month ago  . Drug use: Yes    Types: Marijuana     Allergies   Codeine; Depakote [divalproex sodium]; Migranal [dihydroergotamine]; and Topamax [topiramate]   Review of Systems Review of Systems  Constitutional: Negative for chills and fever.  HENT:  Negative for ear pain and sore throat.   Eyes: Negative for pain and visual disturbance.  Respiratory: Positive for shortness of breath. Negative for cough.   Cardiovascular: Negative for chest pain and palpitations.  Gastrointestinal: Negative for abdominal pain, nausea and vomiting.  Genitourinary: Negative for decreased urine volume and difficulty urinating.  Musculoskeletal: Positive for back pain and myalgias. Negative for arthralgias, neck pain and neck stiffness.  Skin: Negative for color change, rash and wound.  Neurological: Negative for dizziness, syncope, weakness, light-headedness, numbness and headaches.  All other systems reviewed and are negative.    Physical Exam Triage Vital Signs ED Triage Vitals  Enc Vitals Group     BP  01/28/18 1023 (!) 146/96     Pulse Rate 01/28/18 1023 66     Resp 01/28/18 1023 (!) 26     Temp 01/28/18 1023 (!) 97.4 F (36.3 C)     Temp Source 01/28/18 1023 Oral     SpO2 01/28/18 1023 100 %     Weight --      Height --      Head Circumference --      Peak Flow --      Pain Score 01/28/18 1021 10     Pain Loc --      Pain Edu? --      Excl. in [place]? --    No data found.  Updated Vital Signs BP (!) 146/96 (BP Location: Right Arm)   Pulse 66   Temp (!) 97.4 F (36.3 C) (Oral)   Resp (!) 26   SpO2 100%   Visual Acuity Right Eye Distance:   Left Eye Distance:   Bilateral Distance:    Right Eye Near:   Left Eye Near:    Bilateral Near:     Physical Exam  Constitutional: He appears well-developed and well-nourished.  HENT:  Head: Normocephalic and atraumatic.  Eyes: Conjunctivae are normal.  Neck: Neck supple.  Cardiovascular: Normal rate and regular rhythm.  No murmur heard. Pulmonary/Chest: Effort normal and breath sounds normal. No respiratory distress.  Breathing comfortably at rest, pain elicited with deep breathing, breath sounds present throughout all lung fields bilaterally, clear to auscultation  Abdominal: Soft.  There is no tenderness.  Musculoskeletal: He exhibits tenderness. He exhibits no edema.  No obvious swelling or deformity, no midline tenderness to palpation of cervical spine, thoracic spine and lumbar spine.  Patient does have a lot of tenderness to palpation of lower lateral right ribs and thoracic musculature does not extend around to flank or anterior chest.  Nontender to lumbar musculature or upper thoracic on the right side.  Neurological: He is alert.  Skin: Skin is warm and dry.  Psychiatric: He has a normal mood and affect.  Nursing note and vitals reviewed.    UC Treatments / Results  Labs (all labs ordered are listed, but only abnormal results are displayed) Labs Reviewed - No data to display  EKG  EKG Interpretation None       Radiology No results found.  Procedures Procedures (including critical care time)  Medications Ordered in UC Medications  ibuprofen (ADVIL,MOTRIN) tablet 800 mg (not administered)     Initial Impression / Assessment and Plan / UC Course  I have reviewed the triage vital signs and the nursing notes.  Pertinent labs & imaging results that were available during my care of the patient were reviewed by me and considered in my medical decision making (see chart for details).     Given patient with normal previous x-ray without fracture, oxygen 100%, breathing comfortably at rest, will defer additional x-rays.  Will treat with anti-inflammatories, ice and heat. Discussed strict return precautions. Patient verbalized understanding and is agreeable with plan.   Final Clinical Impressions(s) / UC Diagnoses   Final diagnoses:  Acute right-sided thoracic back pain    ED Discharge Orders        Ordered    cyclobenzaprine (FLEXERIL) 10 MG tablet  2 times daily PRN     01/28/18 1054    ibuprofen (ADVIL,MOTRIN) 800 MG tablet  3 times daily     01/28/18 1054    acetaminophen (TYLENOL) 325 MG tablet  Every 6  hours PRN     01/28/18 1054        Controlled Substance Prescriptions [place] Controlled Substance Registry consulted? Not Applicable   Janith Lima, [place] 01/28/18 1101

## 2018-01-29 ENCOUNTER — Inpatient Hospital Stay (HOSPITAL_COMMUNITY)
Admission: AD | Admit: 2018-01-29 | Discharge: 2018-02-06 | DRG: 897 | Disposition: A | Discharge status: Home / Self Care | Payer: Medicare Other | Source: Intra-hospital | Attending: Psychiatry | Admitting: Psychiatry

## 2018-01-29 ENCOUNTER — Encounter (HOSPITAL_COMMUNITY): Payer: Self-pay | Admitting: *Deleted

## 2018-01-29 ENCOUNTER — Other Ambulatory Visit: Payer: Self-pay

## 2018-01-29 DIAGNOSIS — F1020 Alcohol dependence, uncomplicated: Secondary | ICD-10-CM | POA: Diagnosis present

## 2018-01-29 DIAGNOSIS — R45851 Suicidal ideations: Secondary | ICD-10-CM | POA: Diagnosis present

## 2018-01-29 DIAGNOSIS — F332 Major depressive disorder, recurrent severe without psychotic features: Secondary | ICD-10-CM | POA: Diagnosis not present

## 2018-01-29 DIAGNOSIS — R450 Nervousness: Secondary | ICD-10-CM | POA: Diagnosis not present

## 2018-01-29 DIAGNOSIS — G4700 Insomnia, unspecified: Secondary | ICD-10-CM | POA: Diagnosis not present

## 2018-01-29 DIAGNOSIS — Z8249 Family history of ischemic heart disease and other diseases of the circulatory system: Secondary | ICD-10-CM | POA: Diagnosis not present

## 2018-01-29 DIAGNOSIS — F17210 Nicotine dependence, cigarettes, uncomplicated: Secondary | ICD-10-CM | POA: Diagnosis not present

## 2018-01-29 DIAGNOSIS — I10 Essential (primary) hypertension: Secondary | ICD-10-CM | POA: Diagnosis present

## 2018-01-29 DIAGNOSIS — F322 Major depressive disorder, single episode, severe without psychotic features: Secondary | ICD-10-CM | POA: Diagnosis present

## 2018-01-29 DIAGNOSIS — Z823 Family history of stroke: Secondary | ICD-10-CM

## 2018-01-29 DIAGNOSIS — Z736 Limitation of activities due to disability: Secondary | ICD-10-CM | POA: Diagnosis not present

## 2018-01-29 DIAGNOSIS — Z820 Family history of epilepsy and other diseases of the nervous system: Secondary | ICD-10-CM | POA: Diagnosis not present

## 2018-01-29 DIAGNOSIS — G43909 Migraine, unspecified, not intractable, without status migrainosus: Secondary | ICD-10-CM | POA: Diagnosis present

## 2018-01-29 DIAGNOSIS — Z915 Personal history of self-harm: Secondary | ICD-10-CM | POA: Diagnosis not present

## 2018-01-29 DIAGNOSIS — F1290 Cannabis use, unspecified, uncomplicated: Secondary | ICD-10-CM | POA: Diagnosis not present

## 2018-01-29 DIAGNOSIS — F1099 Alcohol use, unspecified with unspecified alcohol-induced disorder: Secondary | ICD-10-CM | POA: Diagnosis not present

## 2018-01-29 DIAGNOSIS — F1994 Other psychoactive substance use, unspecified with psychoactive substance-induced mood disorder: Secondary | ICD-10-CM | POA: Diagnosis not present

## 2018-01-29 DIAGNOSIS — Z808 Family history of malignant neoplasm of other organs or systems: Secondary | ICD-10-CM

## 2018-01-29 DIAGNOSIS — F1414 Cocaine abuse with cocaine-induced mood disorder: Secondary | ICD-10-CM | POA: Diagnosis present

## 2018-01-29 DIAGNOSIS — F1410 Cocaine abuse, uncomplicated: Secondary | ICD-10-CM | POA: Diagnosis present

## 2018-01-29 DIAGNOSIS — F419 Anxiety disorder, unspecified: Secondary | ICD-10-CM | POA: Diagnosis not present

## 2018-01-29 DIAGNOSIS — Z8673 Personal history of transient ischemic attack (TIA), and cerebral infarction without residual deficits: Secondary | ICD-10-CM | POA: Diagnosis not present

## 2018-01-29 DIAGNOSIS — F1490 Cocaine use, unspecified, uncomplicated: Secondary | ICD-10-CM | POA: Diagnosis not present

## 2018-01-29 DIAGNOSIS — F10230 Alcohol dependence with withdrawal, uncomplicated: Secondary | ICD-10-CM | POA: Diagnosis present

## 2018-01-29 DIAGNOSIS — Z800 Family history of malignant neoplasm of digestive organs: Secondary | ICD-10-CM

## 2018-01-29 DIAGNOSIS — L259 Unspecified contact dermatitis, unspecified cause: Secondary | ICD-10-CM | POA: Diagnosis present

## 2018-01-29 HISTORY — DX: Bipolar disorder, unspecified: F31.9

## 2018-01-29 MED ORDER — AMITRIPTYLINE HCL 75 MG PO TABS
75.0000 mg | ORAL_TABLET | Freq: Every day | ORAL | Status: DC
  Administered 2018-01-29: 75 mg via ORAL
  Filled 2018-01-29 (×3): qty 1

## 2018-01-29 MED ORDER — ENSURE ENLIVE PO LIQD
237.0000 mL | Freq: Two times a day (BID) | ORAL | Status: DC
  Administered 2018-01-29: 237 mL via ORAL

## 2018-01-29 MED ORDER — NICOTINE 21 MG/24HR TD PT24
21.0000 mg | MEDICATED_PATCH | Freq: Every day | TRANSDERMAL | Status: DC
  Administered 2018-01-29 – 2018-01-31 (×3): 21 mg via TRANSDERMAL
  Filled 2018-01-29 (×5): qty 1

## 2018-01-29 MED ORDER — QUETIAPINE FUMARATE 100 MG PO TABS
100.0000 mg | ORAL_TABLET | Freq: Every day | ORAL | Status: DC
  Administered 2018-01-29: 100 mg via ORAL
  Filled 2018-01-29 (×3): qty 1

## 2018-01-29 MED ORDER — ALUM & MAG HYDROXIDE-SIMETH 200-200-20 MG/5ML PO SUSP
30.0000 mL | ORAL | Status: DC | PRN
  Administered 2018-01-31 – 2018-02-03 (×3): 30 mL via ORAL
  Filled 2018-01-29 (×3): qty 30

## 2018-01-29 MED ORDER — SUMATRIPTAN SUCCINATE 50 MG PO TABS
50.0000 mg | ORAL_TABLET | Freq: Every day | ORAL | Status: DC | PRN
  Administered 2018-01-31: 50 mg via ORAL
  Filled 2018-01-29: qty 1

## 2018-01-29 MED ORDER — ENSURE ENLIVE PO LIQD: 237.0000 mL | Freq: Two times a day (BID) | ORAL | Status: DC | PRN

## 2018-01-29 MED ORDER — LEVETIRACETAM 500 MG PO TABS
500.0000 mg | ORAL_TABLET | Freq: Two times a day (BID) | ORAL | Status: DC
  Administered 2018-01-29 – 2018-02-06 (×16): 500 mg via ORAL
  Filled 2018-01-29 (×19): qty 1

## 2018-01-29 MED ORDER — IBUPROFEN 800 MG PO TABS
800.0000 mg | ORAL_TABLET | Freq: Four times a day (QID) | ORAL | Status: DC | PRN
  Administered 2018-01-29 – 2018-02-04 (×2): 800 mg via ORAL
  Filled 2018-01-29 (×2): qty 1

## 2018-01-29 MED ORDER — ACETAMINOPHEN 325 MG PO TABS
650.0000 mg | ORAL_TABLET | Freq: Four times a day (QID) | ORAL | Status: DC | PRN
  Administered 2018-01-29 – 2018-02-04 (×7): 650 mg via ORAL
  Filled 2018-01-29 (×7): qty 2

## 2018-01-29 MED ORDER — MAGNESIUM HYDROXIDE 400 MG/5ML PO SUSP
30.0000 mL | Freq: Every day | ORAL | Status: DC | PRN
  Administered 2018-01-31: 30 mL via ORAL
  Filled 2018-01-29: qty 30

## 2018-01-29 NOTE — ED Notes (Signed)
Patient ambulated to the restroom

## 2018-01-29 NOTE — Progress Notes (Signed)
Patient ID: ZANDON TALTON, male   DOB: [birth year], 59 y.o.   MRN: [id number] PER STATE REGULATIONS 482.30  THIS CHART WAS REVIEWED FOR MEDICAL NECESSITY WITH RESPECT TO THE PATIENT'S ADMISSION/DURATION OF STAY.  NEXT REVIEW DATE:  Roma Schanz, RN, BSN CASE MANAGER 02/02/18

## 2018-01-29 NOTE — ED Notes (Signed)
Belongings including valuables where given to [place] with [facility] transport.

## 2018-01-29 NOTE — Progress Notes (Signed)
D.  Pt is new admission to unit, see admission note.  Pt presents with complaint of headache, states that he is on disability for chronic migraines.  Pt states he might average two days a week without a headache.  Pt was positive for evening AA group, observed engaged in appropriate but minimal interaction on unit.  Pt reports that he takes 150 mg rather than 75 mg of Elavil at night and 300 mg of Seroquel as opposed to the 100 mg ordered.  Pt will speak to the doctor about this tomorrow.  Pt denies SI/HI/AVH at this time.  A.  Support and encouragement offered, medication given as ordered.  R.  Pt remain safe on the unit, will continue to monitor.

## 2018-01-29 NOTE — Plan of Care (Signed)
Nurse discussed depression, anxiety, coping skills with patient.  

## 2018-01-29 NOTE — ED Notes (Signed)
Patient signed consent for transfer and treatment at [facility].

## 2018-01-29 NOTE — ED Notes (Signed)
Patient dentures taken with patient to [facility]

## 2018-01-29 NOTE — Tx Team (Signed)
Initial Treatment Plan 01/29/2018 3:02 PM Spencer Mendoza QAS:[id number]    PATIENT STRESSORS: Financial difficulties Health problems Marital or family conflict Medication change or noncompliance Substance abuse   PATIENT STRENGTHS: Capable of independent living Communication skills General fund of knowledge Motivation for treatment/growth Physical Health   PATIENT IDENTIFIED PROBLEMS: "anxiety"  "depression"  "panic attacks"  "substance abuse"  "suicidal thoughts"             DISCHARGE CRITERIA:  Ability to meet basic life and health needs Adequate post-discharge living arrangements Improved stabilization in mood, thinking, and/or behavior Motivation to continue treatment in a less acute level of care Need for constant or close observation no longer present Reduction of life-threatening or endangering symptoms to within safe limits Safe-care adequate arrangements made Verbal commitment to aftercare and medication compliance Withdrawal symptoms are absent or subacute and managed without 24-hour nursing intervention  PRELIMINARY DISCHARGE PLAN: Attend aftercare/continuing care group Attend PHP/IOP Attend 12-step recovery group Outpatient therapy Placement in alternative living arrangements  PATIENT/FAMILY INVOLVEMENT: This treatment plan has been presented to and reviewed with the patient, Spencer Mendoza.  The patient and family have been given the opportunity to ask questions and make suggestions.  [place] Ormond Hessmer, [place] 01/29/2018, 3:02 PM

## 2018-01-29 NOTE — Progress Notes (Signed)
NUTRITION ASSESSMENT  Pt identified as at risk on the Malnutrition Screen Tool  INTERVENTION: 1. Educated patient on the importance of nutrition and encouraged intake of food and beverages. 2. Discussed weight goals. 3. Supplements: continue Ensure Enlive but will change to PRN BID, each supplement provides 350 kcal and 20 grams of protein.   NUTRITION DIAGNOSIS: Unintentional weight loss related to sub-optimal intake as evidenced by pt report.   Goal: Pt to meet >/= 90% of their estimated nutrition needs.  Monitor:  PO intake  Assessment:  Pt admitted for SI with a plan to OD on heroin or amitriptyline, per notes. Pt is going through a divorce and has been living in his car. He smokes a pack of cigarettes daily, has used cocaine weekly for the past 43 years, and has been consuming alcohol daily for the past 46 years.   Noted weight hx outlined below, but unsure of accuracy of the two weights earlier this month. Continue to encourage PO intakes of meals and snacks and offered Ensure as needed.   57 y.o. male  Height: Ht Readings from Last 1 Encounters:  01/29/18 6' (1.829 m)    Weight: Wt Readings from Last 1 Encounters:  01/29/18 177 lb (80.3 kg)    Weight Hx: Wt Readings from Last 10 Encounters:  01/29/18 177 lb (80.3 kg)  01/28/18 184 lb (83.5 kg)  01/22/18 190 lb (86.2 kg)  07/04/15 183 lb (83 kg)  06/20/15 183 lb (83 kg)  06/18/15 187 lb (84.8 kg)  06/06/15 181 lb 12.8 oz (82.5 kg)  06/05/15 179 lb (81.2 kg)  05/21/15 180 lb (81.6 kg)  11/28/14 164 lb (74.4 kg)    BMI:  Body mass index is 24.01 kg/m. Pt meets criteria for normal weight based on current BMI.  Estimated Nutritional Needs: Kcal: 25-30 kcal/kg Protein: > 1 gram protein/kg Fluid: 1 ml/kcal  Diet Order: Diet regular Room service appropriate? Yes; Fluid consistency: Thin Pt is also offered choice of unit snacks mid-morning and mid-afternoon.  Pt is eating as desired.   Lab results and  medications reviewed.      Jarome Matin, MS, RD, LDN, [facility] Inpatient Clinical Dietitian Pager # [phone] After hours/weekend pager # [phone]

## 2018-01-29 NOTE — ED Notes (Signed)
Report called to [place]

## 2018-01-29 NOTE — Progress Notes (Signed)
Pt accepted to [facility], Bed 306-2 Shuvon Rankin, NP is the accepting provider.   Dr. Parke Poisson is the attending provider.   Call report to [phone]  Carmelina Paddock, RN @[facility]  ED notified.  Pt is Voluntary.   Pt may be transported by [facility]. Pt scheduled  to arrive at [facility] @ [place], [place], [place] Disposition CSW [facility] BHH/TTS [phone]

## 2018-01-29 NOTE — BHH Group Notes (Signed)
[place] Group Notes:  (Nursing/MHT/Case Management/Adjunct)  Date:  01/29/2018  Time:  4:00 pm  Type of Therapy:  Psychoeducational Skills  Participation Level:  Did Not Attend  Participation Quality:    Affect:    Cognitive:    Insight:    Engagement in Group:    Modes of Intervention:    Summary of Progress/Problems:  Cammy Copa 01/29/2018, 7:18 PM

## 2018-01-29 NOTE — Progress Notes (Signed)
Patient is 57 yrs old, voluntary, drove himself to [facility].  Patient has been living in his car which is parked at [facility].  Patient is going through divorce, no children.  SI plan to OD on heroin or amitriptyline.  UDS positive for benzos and cocaine.  BAL normal.  Patient denied stroke in the past, no problems now, admitted to hx of seizure.  Patient stated he is bipolar.  Smokes one pack cigarettes daily, has used heroin in past year, cocaine every week since age of 57 yrs old, cannot remember if he has abused pain medicine, alcohol daily since age of 57 yrs old.  Patient stated his parents were alcoholics.  Patient has 2 sisters who live in [place] but will not help him.  One sister in [place] who will not help him.  Has seen Dr. Chauncey Reading, [place], [place].  [facility]  In [place], [place] know about his medicines.  Patient stated he does have SI thoughts often, but not right now during admission process.  Denied HI.  Denied A/V hallucinations.  Patient stated he wishes his live was over, nothing positive in his life, going through divorced.  Thoughts were to OD on heroin, contracts for safety now.  Stated he does have medicaid in [place] and [place].  Went to school in 9th grade, does have his GED.   Fall risk information given and discussed with patient.  Has fallen in past 6 months.  High fall risk. Patient offered food/drink.  Patient was cooperative and pleasant. Patient oriented to 300 hall.

## 2018-01-29 NOTE — ED Notes (Signed)
Patient breakfast at bedside.  

## 2018-01-30 DIAGNOSIS — F1290 Cannabis use, unspecified, uncomplicated: Secondary | ICD-10-CM

## 2018-01-30 DIAGNOSIS — F17210 Nicotine dependence, cigarettes, uncomplicated: Secondary | ICD-10-CM

## 2018-01-30 DIAGNOSIS — F1490 Cocaine use, unspecified, uncomplicated: Secondary | ICD-10-CM

## 2018-01-30 DIAGNOSIS — F1994 Other psychoactive substance use, unspecified with psychoactive substance-induced mood disorder: Secondary | ICD-10-CM

## 2018-01-30 DIAGNOSIS — F419 Anxiety disorder, unspecified: Secondary | ICD-10-CM

## 2018-01-30 DIAGNOSIS — F1099 Alcohol use, unspecified with unspecified alcohol-induced disorder: Secondary | ICD-10-CM

## 2018-01-30 DIAGNOSIS — G4700 Insomnia, unspecified: Secondary | ICD-10-CM

## 2018-01-30 DIAGNOSIS — R450 Nervousness: Secondary | ICD-10-CM

## 2018-01-30 DIAGNOSIS — Z736 Limitation of activities due to disability: Secondary | ICD-10-CM

## 2018-01-30 DIAGNOSIS — F332 Major depressive disorder, recurrent severe without psychotic features: Secondary | ICD-10-CM | POA: Diagnosis present

## 2018-01-30 MED ORDER — QUETIAPINE FUMARATE 200 MG PO TABS
200.0000 mg | ORAL_TABLET | Freq: Every day | ORAL | Status: DC
  Administered 2018-01-30 – 2018-02-03 (×4): 200 mg via ORAL
  Filled 2018-01-30 (×10): qty 1

## 2018-01-30 MED ORDER — FLUOXETINE HCL 10 MG PO CAPS
10.0000 mg | ORAL_CAPSULE | Freq: Every day | ORAL | Status: DC
  Administered 2018-01-31: 10 mg via ORAL
  Filled 2018-01-30 (×4): qty 1

## 2018-01-30 MED ORDER — TRAZODONE HCL 100 MG PO TABS
ORAL_TABLET | ORAL | Status: AC
  Filled 2018-01-30: qty 1

## 2018-01-30 MED ORDER — TRAZODONE HCL 100 MG PO TABS
100.0000 mg | ORAL_TABLET | Freq: Once | ORAL | Status: AC
  Administered 2018-01-30: 100 mg via ORAL

## 2018-01-30 MED ORDER — GABAPENTIN 100 MG PO CAPS
200.0000 mg | ORAL_CAPSULE | Freq: Two times a day (BID) | ORAL | Status: DC
  Administered 2018-01-30 – 2018-01-31 (×2): 200 mg via ORAL
  Filled 2018-01-30 (×6): qty 2
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
## 2018-01-30 NOTE — [facility] Group Notes (Signed)
Identifying Needs   Date:  01/30/2018  Time:  9:33 PM  Type of Therapy:  Nurse Education  //  The group focuses on teaching patients how to identify their needs and then how to develop the skills needed to get those needs met.  Participation Level:  Active  Participation Quality:  Appropriate  Affect:  Appropriate  Cognitive:  Appropriate  Insight:  Appropriate  Engagement in Group:  Engaged  Modes of Intervention:  Education  Summary of Progress/Problems:  Lauralyn Primes 01/30/2018, 9:33 PM

## 2018-01-30 NOTE — H&P (Signed)
Psychiatric Admission Assessment Adult  Patient Identification: Spencer Mendoza MRN:  [id number] Date of Evaluation:  01/30/2018 Chief Complaint:  MDD REC SEV ALCOHOL USE DISORDER; REC SEV Principal Diagnosis: Major depressive disorder, recurrent episode, severe ([place]) Diagnosis:   Patient Active Problem List   Diagnosis Date Noted  . Major depressive disorder, recurrent episode, severe ([place]) [F33.2] 01/30/2018    Priority: High  . Cocaine abuse ([place]) [F14.10] 05/22/2015    Priority: High  . Alcohol dependence with uncomplicated withdrawal ([place]) [F10.230]     Priority: High  . MDD (major depressive disorder), single episode, severe , no psychosis ([place]) [F32.2] 01/29/2018  . Hemiplegic migraine with status migrainosus [G43.401] 06/06/2015  . Chronic insomnia [F51.04] 06/06/2015  . Headache, migraine [G43.909]   . Migraines [G43.909] 11/29/2014  . Headache [R51] 08/08/2014  . Seizures ([place]) [R56.9] 01/09/2014  . Chronic low back pain [M54.5, G89.29] 12/28/2013  . Hemiplegia, unspecified, affecting dominant side [G81.90] 12/06/2013  . CVA (cerebral infarction) [I63.9] 12/04/2013  . Leukocytosis [D72.829] 12/04/2013  . Acute Gastroenteritis [K52.9] 12/04/2013  . Hypertension [I10] 12/04/2013  . Hemiparesis ([place]) [G81.90] 12/04/2013  . Delirium tremens ([place]) [F10.231] 07/29/2013  . Alcohol withdrawal ([place]) [F10.239] 07/29/2013  . UGIB (upper gastrointestinal bleed) [K92.2] 07/29/2013  . Chest pain [R07.9] 07/28/2013   History of Present Illness: Spencer Mendoza is a 57 year-old white male voluntarily admitted to [facility] behavioral health unit on 3/15 after having suicidal ideations with a plan.  I met with the patient on the unit, he seemed enthusiastic about finding an area where he could privately discuss his concerns with me.  When asked why he was here, he stated, "I was going to kill myself and still will if something doesn't change".  Reports a remarkable history positive for  "migraines" that he dates back to his childhood.  States he has tried neurologist recommendations that were unsuccessful; remarks he does not want to see a pain management specialist, "I don't want to get addicted to those drugs".  On 3/15, patient reports purchasing approximately $700.00 worth of heroine.   States he uses street drugs to help silence the migraines.  Per patient, he had intent of ending his life but was interrupted by a visit from a male friend.  She was able to get him to realize he needed professional help and agreed to evaluation.  [name] endorses previous suicide attempts.  Reports purchasing copious amounts of "crack" to end his life.  States he consumed the crack but it did not work, " I am still here".  He was recently discharged in February 2019, from [employer] in [place], drank as soon as he got off the bus.  Infuriating his sister, who had paid many thousands of dollars to go.  Already alienated the rest of the family.  No homicidal ideations or hallucinations.    Associated Signs/Symptoms: Depression Symptoms:  depressed mood, hopelessness, suicidal thoughts with specific plan, suicidal attempt, anxiety, (Hypo) Manic Symptoms:  none Anxiety Symptoms:  Excessive Worry, Psychotic Symptoms:  none PTSD Symptoms: Negative Total Time spent with patient: 45 minutes  Past Psychiatric History: depression, substance abuse  Is the patient at risk to self? Yes.    Has the patient been a risk to self in the past 6 months? Yes.    Has the patient been a risk to self within the distant past? Yes.    Is the patient a risk to others? No.  Has the patient been a risk to  others in the past 6 months? No.  Has the patient been a risk to others within the distant past? No.   Prior Inpatient Therapy:   Prior Outpatient Therapy:    Alcohol Screening: Patient refused Alcohol Screening Tool: Yes 1. How often do you have a drink containing alcohol?: 4  or more times a week 2. How many drinks containing alcohol do you have on a typical day when you are drinking?: 10 or more 3. How often do you have six or more drinks on one occasion?: Daily or almost daily AUDIT-C Score: 12 4. How often during the last year have you found that you were not able to stop drinking once you had started?: Daily or almost daily 5. How often during the last year have you failed to do what was normally expected from you becasue of drinking?: Daily or almost daily 6. How often during the last year have you needed a first drink in the morning to get yourself going after a heavy drinking session?: Daily or almost daily 7. How often during the last year have you had a feeling of guilt of remorse after drinking?: Daily or almost daily 8. How often during the last year have you been unable to remember what happened the night before because you had been drinking?: Daily or almost daily 9. Have you or someone else been injured as a result of your drinking?: No 10. Has a relative or friend or a doctor or another health worker been concerned about your drinking or suggested you cut down?: Yes, during the last year Alcohol Use Disorder Identification Test Final Score (AUDIT): 36 Intervention/Follow-up: Alcohol Education Substance Abuse History in the last 12 months:  Yes.   Consequences of Substance Abuse: Family Consequences:  no contact Previous Psychotropic Medications: Yes  Psychological Evaluations: Yes  Past Medical History:  Past Medical History:  Diagnosis Date  . Allergy   . Anxiety   . Bipolar disorder ([place])   . Chronic insomnia 06/06/2015  . Depression   . Epilepsy ([place])   . Hemiplegic migraine with status migrainosus 06/06/2015  . Hypertension   . Migraine   . Stroke [facility])    patient denies  . Substance abuse [facility])     Past Surgical History:  Procedure Laterality Date  . APPENDECTOMY    . KNEE ARTHROSCOPY Right   . NOSE SURGERY     Family History:   Family History  Problem Relation Age of Onset  . Heart disease Mother   . Stroke Mother   . Epilepsy Mother   . Heart disease Father   . Hypertension Father   . Melanoma Father   . Migraines Father   . Colon cancer Sister   . Colon cancer Cousin    Family Psychiatric  History: father-alcohol dependence Tobacco Screening: Have you used any form of tobacco in the last 30 days? (Cigarettes, Smokeless Tobacco, Cigars, and/or Pipes): Yes Tobacco use, Select all that apply: 5 or more cigarettes per day Are you interested in Tobacco Cessation Medications?: Yes, will notify MD for an order Counseled patient on smoking cessation including recognizing danger situations, developing coping skills and basic information about quitting provided: Yes Social History:  Social History   Substance and Sexual Activity  Alcohol Use Yes  . Alcohol/week: 3.6 oz  . Types: 6 Cans of beer per week   Comment: 6 drinks per day prior to one month ago     Social History   Substance and Sexual Activity  Drug Use Yes  . Frequency: 3.0 times per week  . Types: Marijuana, Heroin, Cocaine   Comment: crack, heroin    Additional Social History:      Pain Medications: see home medications list Prescriptions: see home medication list Over the Counter: see home medication list History of alcohol / drug use?: Yes Longest period of sobriety (when/how long): unsure Negative Consequences of Use: Financial, Personal relationships, Work / Youth worker Withdrawal Symptoms: Other (Comment)(anxiety, depression) Name of Substance 1: alcohol 1 - Age of First Use: 57 yrs old, first beer 1 - Amount (size/oz): daily drinking alcohol 1 - Frequency: daily 1 - Duration: many years 1 - Last Use / Amount: 01/27/2018                  Allergies:   Allergies  Allergen Reactions  . Codeine Nausea And Vomiting  . Depakote [Divalproex Sodium]     Made patient shake  . Migranal [Dihydroergotamine]     Nausea  . Topamax  [Topiramate]     Made patient angry   Lab Results:  Results for orders placed or performed during the hospital encounter of 01/28/18 (from the past 48 hour(s))  Rapid urine drug screen (hospital performed)     Status: Abnormal   Collection Time: 01/28/18  4:33 PM  Result Value Ref Range   Opiates NONE DETECTED NONE DETECTED   Cocaine POSITIVE (A) NONE DETECTED   Benzodiazepines POSITIVE (A) NONE DETECTED   Amphetamines NONE DETECTED NONE DETECTED   Tetrahydrocannabinol NONE DETECTED NONE DETECTED   Barbiturates NONE DETECTED NONE DETECTED    Comment: (NOTE) DRUG SCREEN FOR MEDICAL PURPOSES ONLY.  IF CONFIRMATION IS NEEDED FOR ANY PURPOSE, NOTIFY LAB WITHIN 5 DAYS. LOWEST DETECTABLE LIMITS FOR URINE DRUG SCREEN Drug Class                     Cutoff (ng/mL) Amphetamine and metabolites    1000 Barbiturate and metabolites    200 Benzodiazepine                 063 Tricyclics and metabolites     300 Opiates and metabolites        300 Cocaine and metabolites        300 THC                            50 Performed at [facility], [place] [address], [place], [place] [address]     Blood Alcohol level:  Lab Results  Component Value Date   ETH <10 01/28/2018   ETH 139 (H) 09/32/3557    Metabolic Disorder Labs:  Lab Results  Component Value Date   HGBA1C 5.0 12/05/2013   MPG 97 12/05/2013   MPG 94 07/29/2013   No results found for: PROLACTIN Lab Results  Component Value Date   CHOL 174 12/05/2013   TRIG 283 (H) 12/05/2013   HDL 55 12/05/2013   CHOLHDL 3.2 12/05/2013   VLDL 57 (H) 12/05/2013   LDLCALC 62 12/05/2013   LDLCALC 57 07/29/2013    Current Medications: Current Facility-Administered Medications  Medication Dose Route Frequency Provider Last Rate Last Dose  . acetaminophen (TYLENOL) tablet 650 mg  650 mg Oral Q6H PRN Rankin, Shuvon B, NP   650 mg at 01/30/18 0758  . alum & mag hydroxide-simeth (MAALOX/MYLANTA) 200-200-20 MG/5ML suspension 30 mL  30 mL Oral  Q4H PRN Rankin, Shuvon B, NP      .  amitriptyline (ELAVIL) tablet 75 mg  75 mg Oral QHS Rankin, Shuvon B, NP   75 mg at 01/29/18 2105  . feeding supplement (ENSURE ENLIVE) (ENSURE ENLIVE) liquid 237 mL  237 mL Oral BID PRN Izediuno, Vincent A, MD      . ibuprofen (ADVIL,MOTRIN) tablet 800 mg  800 mg Oral Q6H PRN Lindon Romp A, NP   800 mg at 01/29/18 2107  . levETIRAcetam (KEPPRA) tablet 500 mg  500 mg Oral BID Artist Beach, MD   500 mg at 01/30/18 0755  . magnesium hydroxide (MILK OF MAGNESIA) suspension 30 mL  30 mL Oral Daily PRN Rankin, Shuvon B, NP      . nicotine (NICODERM CQ - dosed in mg/24 hours) patch 21 mg  21 mg Transdermal Daily Izediuno, Laruth Bouchard, MD   21 mg at 01/30/18 0757  . QUEtiapine (SEROQUEL) tablet 100 mg  100 mg Oral QHS Rankin, Shuvon B, NP   100 mg at 01/29/18 2105  . SUMAtriptan (IMITREX) tablet 50 mg  50 mg Oral Daily PRN Lindon Romp A, NP       PTA Medications: Medications Prior to Admission  Medication Sig Dispense Refill Last Dose  . aluminum-magnesium hydroxide-simethicone (MAALOX) 027-253-66 MG/5ML SUSP Take 30 mLs by mouth 3 (three) times daily as needed (INDIGESTION).   unknown at prn  . amitriptyline (ELAVIL) 25 MG tablet Take 3 tablets (75 mg total) by mouth at bedtime. For sleep (Patient taking differently: Take 150 mg by mouth at bedtime. For sleep) 90 tablet 0 01/27/2018 at Unknown time  . Aspirin-Salicylamide-Caffeine (BC HEADACHE POWDER PO) Take 1 Package by mouth as needed.   01/27/2018 at Unknown time  . busPIRone (BUSPAR) 10 MG tablet Take 10 mg by mouth 3 (three) times daily.   01/27/2018 at Unknown time  . FLUoxetine (PROZAC) 40 MG capsule Take 40 mg by mouth daily.   01/27/2018 at Unknown time  . ibuprofen (ADVIL,MOTRIN) 800 MG tablet Take 1 tablet (800 mg total) by mouth 3 (three) times daily. (Patient taking differently: Take 800 mg by mouth every 6 (six) hours as needed. ) 21 tablet 0 01/25/2018 at prn  . levETIRAcetam (KEPPRA) 500 MG tablet  Take 500 mg by mouth 2 (two) times daily.   01/27/2018 at Unknown time  . Magnesium Hydroxide (MILK OF MAGNESIA PO) Take 30 mLs by mouth 3 (three) times daily as needed (CONSTIPATION).   Past Week at prn  . propranolol (INDERAL) 20 MG tablet Take 20 mg by mouth 3 (three) times daily.   01/27/2018 at Unknown time  . propranolol (INDERAL) 40 MG tablet Take 1 tablet (40 mg total) by mouth 2 (two) times daily. (Patient not taking: Reported on 01/22/2018) 60 tablet 0 Not Taking at Unknown time  . QUEtiapine (SEROQUEL) 100 MG tablet Take 100 mg by mouth See admin instructions. Take 100 mg in the morning at 12:00 and at 1600. 300 mg at 20:00   01/27/2018 at Unknown time  . SUMAtriptan (IMITREX) 50 MG tablet Take 50 mg by mouth daily as needed (MIGRAINES).    01/25/2018 at prn  . thiamine (VITAMIN B-1) 100 MG tablet Take 100 mg by mouth daily.    01/27/2018 at Unknown time  . [DISCONTINUED] acetaminophen (TYLENOL) 325 MG tablet Take 2 tablets (650 mg total) by mouth every 6 (six) hours as needed. 30 tablet 0 01/25/2018 at prn  . [DISCONTINUED] ALPRAZolam (XANAX) 0.25 MG tablet Take 0.25 mg by mouth at bedtime.    01/26/2018  . [  DISCONTINUED] Bismuth Subsalicylate (KAOPECTATE PO) Take 30 mLs by mouth 3 (three) times daily as needed (DIARRHEA).   unknown at prn  . [DISCONTINUED] cyclobenzaprine (FLEXERIL) 10 MG tablet Take 1 tablet (10 mg total) by mouth 2 (two) times daily as needed for up to 10 days for muscle spasms. 20 tablet 0 01/27/2018 at prn  . [DISCONTINUED] hydrOXYzine (ATARAX/VISTARIL) 25 MG tablet Take 1 tablet (25 mg total) by mouth every 6 (six) hours as needed for anxiety. (Patient not taking: Reported on 01/22/2018) 30 tablet 0 Not Taking at Unknown time    Musculoskeletal: Strength & Muscle Tone: within normal limits Gait & Station: normal Patient leans: N/A  Psychiatric Specialty Exam: Physical Exam  Constitutional: He is oriented to person, place, and time. He appears well-developed and  well-nourished.  HENT:  Head: Normocephalic.  Neck: Normal range of motion.  Respiratory: Effort normal.  Musculoskeletal: Normal range of motion.  Neurological: He is alert and oriented to person, place, and time.  Psychiatric: His speech is normal and behavior is normal. Judgment normal. Cognition and memory are normal. He exhibits a depressed mood. He expresses suicidal ideation. He expresses suicidal plans.    Review of Systems  Psychiatric/Behavioral: Positive for depression, substance abuse and suicidal ideas. The patient is nervous/anxious.   All other systems reviewed and are negative.   Blood pressure 125/87, pulse 86, temperature 98 F (36.7 C), resp. rate 18, height 6' (1.829 m), weight 80.3 kg (177 lb), SpO2 98 %.Body mass index is 24.01 kg/m.  General Appearance: Casual  Eye Contact:  Good  Speech:  Normal Rate  Volume:  Normal  Mood:  Anxious and Depressed  Affect:  Flat  Thought Process:  Coherent and Descriptions of Associations: Intact  Orientation:  Full (Time, Place, and Person)  Thought Content:  WDL and Logical  Suicidal Thoughts:  Yes.  with intent/plan  Homicidal Thoughts:  No  Memory:  Immediate;   Good Recent;   Good Remote;   Good  Judgement:  Impaired  Insight:  Fair  Psychomotor Activity:  Increased  Concentration:  Concentration: Fair and Attention Span: Fair  Recall:  Good  Fund of Knowledge:  Good  Language:  Good  Akathisia:  No  Handed:  Right  AIMS (if indicated):     Assets:  Communication Skills Desire for Improvement Leisure Time Physical Health Resilience  ADL's:  Intact  Cognition:  WNL  Sleep:  Number of Hours: 6    Treatment Plan Summary: Daily contact with patient to assess and evaluate symptoms and progress in treatment, Medication management and Plan major depressive disorder, recurrent, severe without psychosis:   PLAN OF CARE:  Major depressive disorder, recurrent, severe without psychosis  - Admit to inpatient  level of care  - Major depressive disorder, recurrent, severe without psychosis - Start fluoxetine 10 mg daily for depression  - Anxiety  - Start gabapentin 200 mg BID anxiety  - Insomnia/mood              -Seroquel 200 mg at bedtime for sleep and mood  -Encourage participation in groups and therapeutic milieu  -Discharge planning will be ongoing   Observation Level/Precautions:  15 minute checks  Laboratory:  Completed in the ED, reviewed, stable  Psychotherapy:  Individual and group therapy  Medications:  See MAR  Consultations:  NOne  Discharge Concerns:  Homelessness  Estimated LOS:  5-7 days  Other:     Physician Treatment Plan for Primary Diagnosis: Major depressive disorder, recurrent episode,  severe ([place]) Long Term Goal(s): Improvement in symptoms so as ready for discharge  Short Term Goals: Ability to identify changes in lifestyle to reduce recurrence of condition will improve, Ability to verbalize feelings will improve, Ability to disclose and discuss suicidal ideas, Ability to demonstrate self-control will improve, Ability to identify and develop effective coping behaviors will improve, Ability to maintain clinical measurements within normal limits will improve, Compliance with prescribed medications will improve and Ability to identify triggers associated with substance abuse/mental health issues will improve  Physician Treatment Plan for Secondary Diagnosis: Principal Problem:   Major depressive disorder, recurrent episode, severe ([place]) Active Problems:   Alcohol dependence with uncomplicated withdrawal ([place])   Cocaine abuse ([place])   MDD (major depressive disorder), single episode, severe , no psychosis ([place])  Long Term Goal(s): Improvement in symptoms so as ready for discharge  Short Term Goals: Ability to identify changes in lifestyle to reduce recurrence of condition will improve, Ability to verbalize feelings will improve, Ability  to disclose and discuss suicidal ideas, Ability to demonstrate self-control will improve, Ability to identify and develop effective coping behaviors will improve, Ability to maintain clinical measurements within normal limits will improve, Compliance with prescribed medications will improve and Ability to identify triggers associated with substance abuse/mental health issues will improve  I certify that inpatient services furnished can reasonably be expected to improve the patient's condition.    Waylan Boga, NP 3/16/20191:42 PM

## 2018-01-30 NOTE — BHH Suicide Risk Assessment (Signed)
[facility] Admission Suicide Risk Assessment   Nursing information obtained from:  Patient Demographic factors:  Male, Caucasian, Low socioeconomic status, Living alone, Unemployed Current Mental Status:  Self-harm thoughts Loss Factors:  Loss of significant relationship, Decline in physical health, Financial problems / change in socioeconomic status Historical Factors:  Family history of mental illness or substance abuse, Impulsivity Risk Reduction Factors:  NA  Total Time spent with patient: 45 minutes Principal Problem: Major depressive disorder, recurrent episode, severe ([place]) Diagnosis:  Substance Induced Mood Disorder Patient Active Problem List   Diagnosis Date Noted  . Major depressive disorder, recurrent episode, severe ([place]) [F33.2] 01/30/2018  . MDD (major depressive disorder), single episode, severe , no psychosis ([place]) [F32.2] 01/29/2018  . Hemiplegic migraine with status migrainosus [G43.401] 06/06/2015  . Chronic insomnia [F51.04] 06/06/2015  . Cocaine abuse ([place]) [F14.10] 05/22/2015  . Alcohol dependence with uncomplicated withdrawal ([place]) [F10.230]   . Headache, migraine [G43.909]   . Migraines [G43.909] 11/29/2014  . Headache [R51] 08/08/2014  . Seizures ([place]) [R56.9] 01/09/2014  . Chronic low back pain [M54.5, G89.29] 12/28/2013  . Hemiplegia, unspecified, affecting dominant side [G81.90] 12/06/2013  . CVA (cerebral infarction) [I63.9] 12/04/2013  . Leukocytosis [D72.829] 12/04/2013  . Acute Gastroenteritis [K52.9] 12/04/2013  . Hypertension [I10] 12/04/2013  . Hemiparesis ([place]) [G81.90] 12/04/2013  . Delirium tremens ([place]) [F10.231] 07/29/2013  . Alcohol withdrawal ([place]) [F10.239] 07/29/2013  . UGIB (upper gastrointestinal bleed) [K92.2] 07/29/2013  . Chest pain [R07.9] 07/28/2013   Subjective Data:  57 y.o Caucasian male, single, unemployed, homeless. Recently discharged from an inpatient treatment program in [place]. Background history of SUD and mood disorder.  Presented to the ER on account of suicidal thoughts. Has plans to overdose on heroine. Says he used $700 worth of cocaine. Wants to die. Feels there is nothing to live for. Main stressor his sister put him out recently because he relapsed on alcohol. UDS is positive for cocaine and benzodiazepines. BAL139  Mg/dl five days ago. Patient continues to express suicidal thoughts. Continues to express hopelessness and worthlessness. Reports excruciating headache. No sweatiness. No retching, nausea or vomiting. No visual, tactile or auditory hallucination. No delusional preoccupation. No evidence of mania. Reports being diagnosed with Korsakoff's in the past. Frustrated about his inability to remember things. No access to weapons. We have agreed to recommence his home medications.  He is cooperative with care. He has agreed to treatment recommendations. He has agreed to communicate suicidal thoughts to staff if the thoughts becomes overwhelming.    Continued Clinical Symptoms:  Alcohol Use Disorder Identification Test Final Score (AUDIT): 36 The "Alcohol Use Disorders Identification Test", Guidelines for Use in Primary Care, Second Edition.  World Pharmacologist [facility]). Score between 0-7:  no or low risk or alcohol related problems. Score between 8-15:  moderate risk of alcohol related problems. Score between 16-19:  high risk of alcohol related problems. Score 20 or above:  warrants further diagnostic evaluation for alcohol dependence and treatment.   CLINICAL FACTORS:   Depression:   Severe Alcohol/Substance Abuse/Dependencies   Musculoskeletal: Strength & Muscle Tone: within normal limits Gait & Station: normal Patient leans: N/A  Psychiatric Specialty Exam: Physical Exam  Constitutional: No distress.  HENT:  Head: Normocephalic and atraumatic.  Respiratory: Effort normal.  Neurological: He is alert.  Psychiatric:  As above     ROS  Blood pressure 125/87, pulse 86, temperature 98 F  (36.7 C), resp. rate 18, height 6' (1.829 m), weight 80.3 kg (177 lb), SpO2 98 %.Body  mass index is 24.01 kg/m.  General Appearance: Flushed, poor grooming, underlying irritability.   Eye Contact:  Minimal  Speech:  Clear and Coherent and Normal Rate  Volume:  Decreased  Mood:  Depressed and Irritable  Affect:  Blunted and mood congruent  Thought Process:  Linear  Orientation:  Full (Time, Place, and Person)  Thought Content:  Rumination  Suicidal Thoughts:  Yes.  with intent/plan  Homicidal Thoughts:  No  Memory:  Unable to assess at this time.   Judgement:  Impaired  Insight:  Shallow  Psychomotor Activity:  Decreased  Concentration:  Concentration: Fair and Attention Span: Fair  Recall:  Unable to assess at this time.   Fund of Knowledge:  Unable to assess at this time.   Language:  Fair  Akathisia:  Negative  Handed:    AIMS (if indicated):     Assets:  Resilience  ADL's:  Impaired  Cognition:  Alert  Sleep:  Number of Hours: 6      COGNITIVE FEATURES THAT CONTRIBUTE TO RISK:  Thought constriction (tunnel vision)    SUICIDE RISK:   Moderate:  Frequent suicidal ideation with limited intensity, and duration, some specificity in terms of plans, no associated intent, good self-control, limited dysphoria/symptomatology, some risk factors present, and identifiable protective factors, including available and accessible social support.  PLAN OF CARE:  1. Alcohol withdrawal protocol 2. Recommence home medications at same dose 3. Symptomatic treatment for cocaine intoxication 4. Q 15 minute check for suicide. 5. Monitor mood, behavior and interaction with peers 6. SW would gather collateral from his family 92. SW would facilitate inpatient rehab placement   I certify that inpatient services furnished can reasonably be expected to improve the patient's condition.   Artist Beach, MD 01/30/2018, 3:07 PM

## 2018-01-30 NOTE — Progress Notes (Signed)
D. Pt presents with a depressed affect and guarded behavior, observed sitting in dayroom upon initial approach. Pt given prn tylenol for headache 7/10 per pt's request. Pt reports having slept poorly last night. Per pt's self inventory, pt rates his depression, hopelessness and anxiety a 10/10/8, respectively. Pt reports intermittent suicidal thoughts and agrees to contact staff before acting on any harmful thoughts. Pt denies A/V hallucinations A. Labs and vitals monitored. Pt compliant with medications. Pt supported emotionally and encouraged to express concerns and ask questions.   R. Pt remains safe with 15 minute checks. Will continue POC.

## 2018-01-30 NOTE — BHH Group Notes (Signed)
LCSW Group Therapy Note  01/30/2018   9:00-10:00am (300 hall)                  Type of Therapy and Topic:  Group Therapy: Anger Cues and Responses  Participation Level:  Active   Description of Group:   In this group, patients learned how to recognize the physical, cognitive, emotional, and behavioral responses they have to anger-provoking situations.  They identified a recent time they became angry and how they reacted.  They analyzed how their reaction was possibly beneficial and how it was possibly unhelpful.  The group discussed a variety of healthier coping skills that could help with such a situation in the future.  Deep breathing was practiced briefly.  Therapeutic Goals: 1. Patients will remember their last incident of anger and how they felt emotionally and physically, what their thoughts were at the time, and how they behaved. 2. Patients will identify how their behavior at that time worked for them, as well as how it worked against them. 3. Patients will explore possible new behaviors to use in future anger situations. 4. Patients will learn that anger itself is normal and cannot be eliminated, and that healthier reactions can assist with resolving conflict rather than worsening situations.  Summary of Patient Progress:  The patient shared that his most recent time of anger was last night and said this was because he could not sleep.  He talked about how he normally just lashes out and hits someone who has angered him, does not even remember afterward.  However, he was able to think about and acknowledge the healthier benefits from using healthier coping skills.  Therapeutic Modalities:   Cognitive Behavioral Therapy  Maretta Los  01/30/2018 12:00pm

## 2018-01-30 NOTE — Progress Notes (Signed)
Patient did attend the evening speaker AA meeting.  

## 2018-01-30 NOTE — BHH Suicide Risk Assessment (Signed)
[facility] INPATIENT:  Family/Significant Other Suicide Prevention Education  Suicide Prevention Education:  Patient Refusal for Family/Significant Other Suicide Prevention Education: The patient Spencer Mendoza has refused to provide written consent for family/significant other to be provided Family/Significant Other Suicide Prevention Education during admission and/or prior to discharge.  Physician notified.  Berlin Hun Grossman-Orr 01/30/2018, 3:43 PM

## 2018-01-31 MED ORDER — VITAMIN B-1 100 MG PO TABS
100.0000 mg | ORAL_TABLET | Freq: Every day | ORAL | Status: DC
  Administered 2018-02-01 – 2018-02-06 (×6): 100 mg via ORAL
  Filled 2018-01-31 (×8): qty 1

## 2018-01-31 MED ORDER — LOPERAMIDE HCL 2 MG PO CAPS: 2.0000 mg | ORAL_CAPSULE | ORAL | Status: DC | PRN

## 2018-01-31 MED ORDER — BUSPIRONE HCL 10 MG PO TABS
10.0000 mg | ORAL_TABLET | Freq: Three times a day (TID) | ORAL | Status: DC
  Administered 2018-01-31 – 2018-02-06 (×17): 10 mg via ORAL
  Filled 2018-01-31: qty 2
  Filled 2018-01-31 (×17): qty 1
  Filled 2018-01-31: qty 2
  Filled 2018-01-31 (×3): qty 1

## 2018-01-31 MED ORDER — ADULT MULTIVITAMIN W/MINERALS CH
1.0000 | ORAL_TABLET | Freq: Every day | ORAL | Status: DC
  Administered 2018-01-31 – 2018-02-06 (×7): 1 via ORAL
  Filled 2018-01-31 (×8): qty 1

## 2018-01-31 MED ORDER — HYDROXYZINE HCL 25 MG PO TABS
25.0000 mg | ORAL_TABLET | Freq: Four times a day (QID) | ORAL | Status: DC | PRN
  Administered 2018-02-02: 25 mg via ORAL
  Filled 2018-01-31: qty 1

## 2018-01-31 MED ORDER — CHLORDIAZEPOXIDE HCL 25 MG PO CAPS
25.0000 mg | ORAL_CAPSULE | Freq: Once | ORAL | Status: AC
  Administered 2018-01-31: 25 mg via ORAL
  Filled 2018-01-31: qty 1

## 2018-01-31 MED ORDER — POLYETHYLENE GLYCOL 3350 17 G PO PACK
17.0000 g | PACK | Freq: Every day | ORAL | Status: DC
  Administered 2018-02-01 – 2018-02-03 (×2): 17 g via ORAL
  Filled 2018-01-31 (×4): qty 1

## 2018-01-31 MED ORDER — ONDANSETRON 4 MG PO TBDP: 4.0000 mg | ORAL_TABLET | Freq: Four times a day (QID) | ORAL | Status: DC | PRN

## 2018-01-31 MED ORDER — CHLORDIAZEPOXIDE HCL 25 MG PO CAPS
25.0000 mg | ORAL_CAPSULE | Freq: Every day | ORAL | Status: AC
  Administered 2018-02-03: 25 mg via ORAL
  Filled 2018-01-31: qty 1

## 2018-01-31 MED ORDER — FLUOXETINE HCL 20 MG PO CAPS
20.0000 mg | ORAL_CAPSULE | Freq: Every day | ORAL | Status: DC
  Administered 2018-02-01 – 2018-02-06 (×6): 20 mg via ORAL
  Filled 2018-01-31 (×8): qty 1

## 2018-01-31 MED ORDER — CHLORDIAZEPOXIDE HCL 25 MG PO CAPS: 25.0000 mg | ORAL_CAPSULE | Freq: Four times a day (QID) | ORAL | Status: DC | PRN

## 2018-01-31 MED ORDER — CHLORDIAZEPOXIDE HCL 25 MG PO CAPS
25.0000 mg | ORAL_CAPSULE | ORAL | Status: AC
  Administered 2018-02-02 (×2): 25 mg via ORAL
  Filled 2018-01-31 (×2): qty 1

## 2018-01-31 MED ORDER — QUETIAPINE FUMARATE 50 MG PO TABS
50.0000 mg | ORAL_TABLET | Freq: Two times a day (BID) | ORAL | Status: DC
  Administered 2018-01-31 – 2018-02-06 (×13): 50 mg via ORAL
  Filled 2018-01-31 (×17): qty 1

## 2018-01-31 MED ORDER — THIAMINE HCL 100 MG/ML IJ SOLN
100.0000 mg | Freq: Once | INTRAMUSCULAR | Status: AC
  Administered 2018-01-31: 100 mg via INTRAMUSCULAR
  Filled 2018-01-31: qty 2

## 2018-01-31 MED ORDER — BISACODYL 5 MG PO TBEC
10.0000 mg | DELAYED_RELEASE_TABLET | Freq: Once | ORAL | Status: AC
  Administered 2018-01-31: 10 mg via ORAL
  Filled 2018-01-31 (×2): qty 2

## 2018-01-31 MED ORDER — CHLORDIAZEPOXIDE HCL 25 MG PO CAPS
25.0000 mg | ORAL_CAPSULE | Freq: Four times a day (QID) | ORAL | Status: AC
  Administered 2018-01-31 (×3): 25 mg via ORAL
  Filled 2018-01-31 (×3): qty 1

## 2018-01-31 MED ORDER — HYDROXYZINE HCL 25 MG PO TABS: 25.0000 mg | ORAL_TABLET | Freq: Four times a day (QID) | ORAL | Status: DC | PRN

## 2018-01-31 MED ORDER — PROPRANOLOL HCL 20 MG PO TABS
20.0000 mg | ORAL_TABLET | Freq: Two times a day (BID) | ORAL | Status: DC
  Administered 2018-01-31 – 2018-02-06 (×12): 20 mg via ORAL
  Filled 2018-01-31 (×14): qty 1
  Filled 2018-01-31: qty 2

## 2018-01-31 MED ORDER — CHLORDIAZEPOXIDE HCL 25 MG PO CAPS
25.0000 mg | ORAL_CAPSULE | Freq: Three times a day (TID) | ORAL | Status: AC
  Administered 2018-02-01 (×3): 25 mg via ORAL
  Filled 2018-01-31 (×3): qty 1

## 2018-01-31 MED ORDER — GABAPENTIN 300 MG PO CAPS
300.0000 mg | ORAL_CAPSULE | Freq: Two times a day (BID) | ORAL | Status: DC
  Administered 2018-01-31 – 2018-02-06 (×12): 300 mg via ORAL
  Filled 2018-01-31 (×15): qty 1

## 2018-01-31 NOTE — Progress Notes (Signed)
EKG results shown to Dr and placed on shadow chart

## 2018-01-31 NOTE — Progress Notes (Signed)
D. Pt presents with an anxious affect and congruient behavior this am- Pt attended am orientation group -voiced concern about his medication changes. Pt tremulous, wringing hands this am - stating, "I don't know why they took me off all my medications". Pt referring to his home meds of seroquel and amitryptiline. Pt did appear that he was going through withdrawals. NP notified. Pt given librium as ordered - and later pt expressed relief, stating, "this is the best I've felt since I've been here". Pt expressing desire to stay at long term rehab place immediately after discharge- Pt endorses passive SI- but only has thoughts sometimes- agrees to contact staff before acting on any harmful thoughts. Per pt's self inventory, pt rates his depression, hopelessness and anxiety a 03/23/09, respectively. Pt writes that his most important goal today is to work on his "depression".  A. Labs and vitals monitored. Pt compliant with medications. Pt supported emotionally and encouraged to express concerns and ask questions.   R. Pt remains safe with 15 minute checks. Will continue POC.

## 2018-01-31 NOTE — Progress Notes (Signed)
Patient has been up in the dayroom watching tv and interacting with peers. Writer spoke with him 1:1 and he c/o constipation. He reports not having a bowel movement since Wednesday. Order was received dulcolax. He  attended group this evening. Patient currently denies having pain, -si/hi/a/v hall.  He also reports feeling much better after his medication was changed.Support and encouragement offered, safety maintained on unit, will continue to monitor.

## 2018-01-31 NOTE — BHH Group Notes (Signed)
[facility] LCSW Group Therapy Note  Date/Time:  01/31/2018 10:00-11:00AM  Type of Therapy and Topic:  Group Therapy:  Healthy and Unhealthy Supports  Participation Level:  Active   Description of Group:  Patients in this group were introduced to the idea of adding a variety of healthy supports to address the various needs in their lives.Patients discussed what additional healthy supports could be helpful in their recovery and wellness after discharge in order to prevent future hospitalizations.   An emphasis was placed on using counselor, doctor, therapy groups, 12-step groups, and problem-specific support groups to expand supports.  They also worked as a group on developing a specific plan for several patients to deal with unhealthy supports through [place], psychoeducation with loved ones, and even termination of relationships.   Therapeutic Goals:   1)  discuss importance of adding supports to stay well once out of the hospital  2)  compare healthy versus unhealthy supports and identify some examples of each  3)  generate ideas and descriptions of healthy supports that can be added  4)  offer mutual support about how to address unhealthy supports  5)  encourage active participation in and adherence to discharge plan    Summary of Patient Progress:  The patient expressed a willingness to add an [employer] to help in his recovery journey.  He stated he needs accountability because when he is on his own, he will always resort to using.   Therapeutic Modalities:   Motivational Interviewing Brief Solution-Focused Therapy  Selmer Dominion, LCSW

## 2018-01-31 NOTE — Progress Notes (Signed)
[facility] MD Progress Note  01/31/2018 9:18 AM DARLY FAILS  MRN:  [id number]   Subjective:  Spencer Mendoza reports " I am very anxious and I would like to get into the [facility] after I discharge."  Objective: JOSHUAH MINELLA is awake, alert and oriented. Seen pacing the unit. Patient reports he hasn't been started back on his home medications and will contact his sister to get all the medications that he is taken. Denies suicidal or homicidal ideation during this assessment.  Eliav reports major depression with substance abuse issues. States his migraine are unbearable which cause him to use heroin and cocaine to take the pain away. Denies auditory or visual hallucination and does not appear to be responding to internal stimuli.  EKG and Labs pending results. Support, encouragement and reassurance was provided.    Principal Problem: Substance induced mood disorder ([place]) Diagnosis:   Patient Active Problem List   Diagnosis Date Noted  . Major depressive disorder, recurrent episode, severe ([place]) [F33.2] 01/30/2018  . Substance induced mood disorder ([place]) [F19.94] 01/30/2018  . MDD (major depressive disorder), single episode, severe , no psychosis ([place]) [F32.2] 01/29/2018  . Hemiplegic migraine with status migrainosus [G43.401] 06/06/2015  . Chronic insomnia [F51.04] 06/06/2015  . Cocaine abuse ([place]) [F14.10] 05/22/2015  . Alcohol dependence with uncomplicated withdrawal ([place]) [F10.230]   . Headache, migraine [G43.909]   . Migraines [G43.909] 11/29/2014  . Headache [R51] 08/08/2014  . Seizures ([place]) [R56.9] 01/09/2014  . Chronic low back pain [M54.5, G89.29] 12/28/2013  . Hemiplegia, unspecified, affecting dominant side [G81.90] 12/06/2013  . CVA (cerebral infarction) [I63.9] 12/04/2013  . Leukocytosis [D72.829] 12/04/2013  . Acute Gastroenteritis [K52.9] 12/04/2013  . Hypertension [I10] 12/04/2013  . Hemiparesis ([place]) [G81.90] 12/04/2013  . Delirium tremens ([place]) [F10.231] 07/29/2013  .  Alcohol withdrawal ([place]) [F10.239] 07/29/2013  . UGIB (upper gastrointestinal bleed) [K92.2] 07/29/2013  . Chest pain [R07.9] 07/28/2013   Total Time spent with patient: 20 minutes  Past Psychiatric History:   Past Medical History:  Past Medical History:  Diagnosis Date  . Allergy   . Anxiety   . Bipolar disorder ([place])   . Chronic insomnia 06/06/2015  . Depression   . Epilepsy ([place])   . Hemiplegic migraine with status migrainosus 06/06/2015  . Hypertension   . Migraine   . Stroke [facility])    patient denies  . Substance abuse [facility])     Past Surgical History:  Procedure Laterality Date  . APPENDECTOMY    . KNEE ARTHROSCOPY Right   . NOSE SURGERY     Family History:  Family History  Problem Relation Age of Onset  . Heart disease Mother   . Stroke Mother   . Epilepsy Mother   . Heart disease Father   . Hypertension Father   . Melanoma Father   . Migraines Father   . Colon cancer Sister   . Colon cancer Cousin    Family Psychiatric  History:  Social History:  Social History   Substance and Sexual Activity  Alcohol Use Yes  . Alcohol/week: 3.6 oz  . Types: 6 Cans of beer per week   Comment: 6 drinks per day prior to one month ago     Social History   Substance and Sexual Activity  Drug Use Yes  . Frequency: 3.0 times per week  . Types: Marijuana, Heroin, Cocaine   Comment: crack, heroin    Social History   Socioeconomic History  . Marital status: Divorced    Spouse  name: None  . Number of children: 0  . Years of education: GED  . Highest education level: None  Social Needs  . Financial resource strain: None  . Food insecurity - worry: None  . Food insecurity - inability: None  . Transportation needs - medical: None  . Transportation needs - non-medical: None  Occupational History  . Occupation: disabled  Tobacco Use  . Smoking status: Current Every Day Smoker    Packs/day: 1.00    Years: 15.00    Pack years: 15.00    Types: Cigarettes    Last  attempt to quit: 03/20/2015    Years since quitting: 2.8  . Smokeless tobacco: Never Used  Substance and Sexual Activity  . Alcohol use: Yes    Alcohol/week: 3.6 oz    Types: 6 Cans of beer per week    Comment: 6 drinks per day prior to one month ago  . Drug use: Yes    Frequency: 3.0 times per week    Types: Marijuana, Heroin, Cocaine    Comment: crack, heroin  . Sexual activity: Yes    Birth control/protection: Condom  Other Topics Concern  . None  Social History Narrative   Pt lives with significant other.    Patient drinks 2 cups of caffeine daily.   Patient is right handed.   Additional Social History:    Pain Medications: see home medications list Prescriptions: see home medication list Over the Counter: see home medication list History of alcohol / drug use?: Yes Longest period of sobriety (when/how long): unsure Negative Consequences of Use: Financial, Personal relationships, Work / Youth worker Withdrawal Symptoms: Other (Comment)(anxiety, depression) Name of Substance 1: alcohol 1 - Age of First Use: 57 yrs old, first beer 1 - Amount (size/oz): daily drinking alcohol 1 - Frequency: daily 1 - Duration: many years 1 - Last Use / Amount: 01/27/2018                  Sleep: Fair  Appetite:  Fair  Current Medications: Current Facility-Administered Medications  Medication Dose Route Frequency Provider Last Rate Last Dose  . acetaminophen (TYLENOL) tablet 650 mg  650 mg Oral Q6H PRN Rankin, Shuvon B, NP   650 mg at 01/30/18 1714  . alum & mag hydroxide-simeth (MAALOX/MYLANTA) 200-200-20 MG/5ML suspension 30 mL  30 mL Oral Q4H PRN Rankin, Shuvon B, NP      . feeding supplement (ENSURE ENLIVE) (ENSURE ENLIVE) liquid 237 mL  237 mL Oral BID PRN Izediuno, Vincent A, MD      . FLUoxetine (PROZAC) capsule 10 mg  10 mg Oral Daily Patrecia Pour, NP   10 mg at 01/31/18 0757  . gabapentin (NEURONTIN) capsule 200 mg  200 mg Oral BID Patrecia Pour, NP   200 mg at 01/31/18  0758  . ibuprofen (ADVIL,MOTRIN) tablet 800 mg  800 mg Oral Q6H PRN Lindon Romp A, NP   800 mg at 01/29/18 2107  . levETIRAcetam (KEPPRA) tablet 500 mg  500 mg Oral BID Artist Beach, MD   500 mg at 01/31/18 0757  . magnesium hydroxide (MILK OF MAGNESIA) suspension 30 mL  30 mL Oral Daily PRN Rankin, Shuvon B, NP   30 mL at 01/31/18 0758  . nicotine (NICODERM CQ - dosed in mg/24 hours) patch 21 mg  21 mg Transdermal Daily Izediuno, Laruth Bouchard, MD   21 mg at 01/31/18 0803  . QUEtiapine (SEROQUEL) tablet 200 mg  200 mg Oral QHS Waylan Boga  Y, NP   200 mg at 01/30/18 2124  . SUMAtriptan (IMITREX) tablet 50 mg  50 mg Oral Daily PRN Rozetta Nunnery, NP        Lab Results: No results found for this or any previous visit (from the past 48 hour(s)).  Blood Alcohol level:  Lab Results  Component Value Date   ETH <10 01/28/2018   ETH 139 (H) 30/07/2329    Metabolic Disorder Labs: Lab Results  Component Value Date   HGBA1C 5.0 12/05/2013   MPG 97 12/05/2013   MPG 94 07/29/2013   No results found for: PROLACTIN Lab Results  Component Value Date   CHOL 174 12/05/2013   TRIG 283 (H) 12/05/2013   HDL 55 12/05/2013   CHOLHDL 3.2 12/05/2013   VLDL 57 (H) 12/05/2013   LDLCALC 62 12/05/2013   LDLCALC 57 07/29/2013    Physical Findings: AIMS: Facial and Oral Movements Muscles of Facial Expression: None, normal Lips and Perioral Area: None, normal Jaw: None, normal Tongue: None, normal,Extremity Movements Upper (arms, wrists, hands, fingers): None, normal Lower (legs, knees, ankles, toes): None, normal, Trunk Movements Neck, shoulders, hips: None, normal, Overall Severity Severity of abnormal movements (highest score from questions above): None, normal Incapacitation due to abnormal movements: None, normal Patient's awareness of abnormal movements (rate only patient's report): No Awareness, Dental Status Current problems with teeth and/or dentures?: Yes(upper dentures fit, problems  with bottom dentures) Does patient usually wear dentures?: Yes(6 teeth on bottom, denture does not fit)  CIWA:  CIWA-Ar Total: 3 COWS:  COWS Total Score: 3  Musculoskeletal: Strength & Muscle Tone: within normal limits Gait & Station: normal Patient leans: N/A  Psychiatric Specialty Exam: Physical Exam  Constitutional: He is oriented to person, place, and time. He appears well-developed.  Neurological: He is alert and oriented to person, place, and time.  Psychiatric: He has a normal mood and affect. His behavior is normal.    Review of Systems  Psychiatric/Behavioral: Positive for depression and substance abuse. Negative for hallucinations. The patient is nervous/anxious.   All other systems reviewed and are negative.   Blood pressure 117/79, pulse 100, temperature 98.6 F (37 C), resp. rate 16, height 6' (1.829 m), weight 80.3 kg (177 lb), SpO2 98 %.Body mass index is 24.01 kg/m.  General Appearance: Casual, pacing,reports symptoms of worry   Eye Contact:  Fair  Speech:  Clear and Coherent  Volume:  Normal  Mood:  Anxious and Depressed  Affect:  Congruent  Thought Process:  Coherent  Orientation:  Full (Time, Place, and Person)  Thought Content:  Hallucinations: None  Suicidal Thoughts:  No  Homicidal Thoughts:  No  Memory:  Immediate;   Fair Recent;   Fair Remote;   Fair  Judgement:  Fair  Insight:  Fair  Psychomotor Activity:  Restlessness  Concentration:  Concentration: Fair  Recall:  [employer] of Knowledge:  Fair  Language:  Fair  Akathisia:  No  Handed:  Right  AIMS (if indicated):     Assets:  Communication Skills Desire for Improvement Financial Resources/Insurance Physical Health  ADL's:  Intact  Cognition:  WNL  Sleep:  Number of Hours: 4     Treatment Plan Summary: Daily contact with patient to assess and evaluate symptoms and progress in treatment and Medication management   Continue with current treatment plan on 01/31/2018 except where noted    - Major depressive disorder, recurrent, severe without psychosis - Increased Fluoxetine 20 mg daily for depression  -  Anxiety  - Continue gabapentin 200 mg BID anxiety  - Insomnia/mood              - Increased Seroquel 50 mg PO BID and  200 mg at bedtime for sleep and mood  -Encourage participation in groups and therapeutic milieu Discharge planning will be ongoing  Derrill Center, NP 01/31/2018, 9:18 AM

## 2018-01-31 NOTE — Progress Notes (Signed)
Patient did attend the evening speaker AA meeting.  

## 2018-01-31 NOTE — BHH Group Notes (Signed)
[place] Group Notes:  (Nursing)  Date:  01/31/2018  Time: 8:30 am Type of Therapy:  orientation  Participation Level:  Active  Participation Quality:  Appropriate  Affect:  Appropriate  Cognitive:  Appropriate  Insight:  Appropriate  Engagement in Group:  Engaged  Modes of Intervention:  Discussion and Orientation  Summary of Progress/Problems:  Waymond Cera 01/31/2018, 7:47 PM

## 2018-02-01 LAB — LIPID PANEL
Cholesterol: 199 mg/dL (ref 0–200)
HDL: 55 mg/dL (ref >40)
LDL Cholesterol: 98 mg/dL (ref 0–99)
Total CHOL/HDL Ratio: 3.6 RATIO
Triglycerides: 229 mg/dL — ABNORMAL HIGH (ref <150)
VLDL: 46 mg/dL — ABNORMAL HIGH (ref 0–40)

## 2018-02-01 LAB — HEMOGLOBIN A1C
Hgb A1c MFr Bld: 4.9 % (ref 4.8–5.6)
Mean Plasma Glucose: 93.93 mg/dL

## 2018-02-01 LAB — TSH: TSH: 2.592 u[IU]/mL (ref 0.350–4.500)

## 2018-02-01 MED ORDER — NICOTINE POLACRILEX 2 MG MT GUM: 2.0000 mg | CHEWING_GUM | OROMUCOSAL | Status: DC | PRN

## 2018-02-01 NOTE — Progress Notes (Signed)
Recreation Therapy Notes  Date: 3.18.19 Time: 9:30 a.m. Location: 300 Hall Dayroom  Group Topic: Stress Management  Goal Area(s) Addresses:  Goal 1.1: To reduce stress  -Patient will report feeling a reduction in stress level  -Patient will identify the importance of stress management  -Patient will participate during stress management group treatment    Behavioral Response: Engaged  Intervention: Stress Management  Activity: Meditattion- Patients were in a peaceful environment with soft lighting enhancing patients mood.   Education: Stress Management, Discharge Planning.   Education Outcome: Acknowledges edcuation/In group clarification offered/Needs additional education  Clinical Observations/Feedback:: Patient attended and participated appropriately during stress management group treatment. Patient reported feeling a reduction in stress level.   Ranell Patrick, Recreation Therapy Intern   Ranell Patrick 02/01/2018 12:36 PM

## 2018-02-01 NOTE — Progress Notes (Signed)
[facility] MD Progress Note  02/01/2018 4:17 PM WASSIM KIRKSEY  MRN:  [id number]   Subjective:  Spencer Mendoza reports " I was able to talk to my sister about starting at a long term treatment program in  [place]( [facility]) States he feels he needs a  4 to 6 month treatment program.   Objective: Hardin Negus Crosson seen resting in dayroom. Patient reports he is medication complaint. Report he has concerns with discharges too soon. States he would like discharge "door to door."  Denies suicidal or homicidal ideation during this assessment.  Justun reports major depression with substance abuse issues. Patient is declining treatment at [facility].  Reports a recent discharge from [facility] states that program was only for 20 days and states "I need a longer term facility."  Denies auditory or visual hallucination and does not appear to be responding to internal stimuli. Support, encouragement and reassurance was provided.    Principal Problem: Substance induced mood disorder ([place]) Diagnosis:   Patient Active Problem List   Diagnosis Date Noted  . Major depressive disorder, recurrent episode, severe ([place]) [F33.2] 01/30/2018  . Substance induced mood disorder ([place]) [F19.94] 01/30/2018  . MDD (major depressive disorder), single episode, severe , no psychosis ([place]) [F32.2] 01/29/2018  . Hemiplegic migraine with status migrainosus [G43.401] 06/06/2015  . Chronic insomnia [F51.04] 06/06/2015  . Cocaine abuse ([place]) [F14.10] 05/22/2015  . Alcohol dependence with uncomplicated withdrawal ([place]) [F10.230]   . Headache, migraine [G43.909]   . Migraines [G43.909] 11/29/2014  . Headache [R51] 08/08/2014  . Seizures ([place]) [R56.9] 01/09/2014  . Chronic low back pain [M54.5, G89.29] 12/28/2013  . Hemiplegia, unspecified, affecting dominant side [G81.90] 12/06/2013  . CVA (cerebral infarction) [I63.9] 12/04/2013  . Leukocytosis [D72.829] 12/04/2013  . Acute Gastroenteritis [K52.9] 12/04/2013  . Hypertension  [I10] 12/04/2013  . Hemiparesis ([place]) [G81.90] 12/04/2013  . Delirium tremens ([place]) [F10.231] 07/29/2013  . Alcohol withdrawal ([place]) [F10.239] 07/29/2013  . UGIB (upper gastrointestinal bleed) [K92.2] 07/29/2013  . Chest pain [R07.9] 07/28/2013   Total Time spent with patient: 20 minutes  Past Psychiatric History:   Past Medical History:  Past Medical History:  Diagnosis Date  . Allergy   . Anxiety   . Bipolar disorder ([place])   . Chronic insomnia 06/06/2015  . Depression   . Epilepsy ([place])   . Hemiplegic migraine with status migrainosus 06/06/2015  . Hypertension   . Migraine   . Stroke [facility])    patient denies  . Substance abuse [facility])     Past Surgical History:  Procedure Laterality Date  . APPENDECTOMY    . KNEE ARTHROSCOPY Right   . NOSE SURGERY     Family History:  Family History  Problem Relation Age of Onset  . Heart disease Mother   . Stroke Mother   . Epilepsy Mother   . Heart disease Father   . Hypertension Father   . Melanoma Father   . Migraines Father   . Colon cancer Sister   . Colon cancer Cousin    Family Psychiatric  History:  Social History:  Social History   Substance and Sexual Activity  Alcohol Use Yes  . Alcohol/week: 3.6 oz  . Types: 6 Cans of beer per week   Comment: 6 drinks per day prior to one month ago     Social History   Substance and Sexual Activity  Drug Use Yes  . Frequency: 3.0 times per week  . Types: Marijuana, Heroin, Cocaine   Comment: crack,  heroin    Social History   Socioeconomic History  . Marital status: Divorced    Spouse name: None  . Number of children: 0  . Years of education: GED  . Highest education level: None  Social Needs  . Financial resource strain: None  . Food insecurity - worry: None  . Food insecurity - inability: None  . Transportation needs - medical: None  . Transportation needs - non-medical: None  Occupational History  . Occupation: disabled  Tobacco Use  . Smoking status:  Current Every Day Smoker    Packs/day: 1.00    Years: 15.00    Pack years: 15.00    Types: Cigarettes    Last attempt to quit: 03/20/2015    Years since quitting: 2.8  . Smokeless tobacco: Never Used  Substance and Sexual Activity  . Alcohol use: Yes    Alcohol/week: 3.6 oz    Types: 6 Cans of beer per week    Comment: 6 drinks per day prior to one month ago  . Drug use: Yes    Frequency: 3.0 times per week    Types: Marijuana, Heroin, Cocaine    Comment: crack, heroin  . Sexual activity: Yes    Birth control/protection: Condom  Other Topics Concern  . None  Social History Narrative   Pt lives with significant other.    Patient drinks 2 cups of caffeine daily.   Patient is right handed.   Additional Social History:    Pain Medications: see home medications list Prescriptions: see home medication list Over the Counter: see home medication list History of alcohol / drug use?: Yes Longest period of sobriety (when/how long): unsure Negative Consequences of Use: Financial, Personal relationships, Work / Youth worker Withdrawal Symptoms: Other (Comment)(anxiety, depression) Name of Substance 1: alcohol 1 - Age of First Use: 57 yrs old, first beer 1 - Amount (size/oz): daily drinking alcohol 1 - Frequency: daily 1 - Duration: many years 1 - Last Use / Amount: 01/27/2018                  Sleep: Fair  Appetite:  Fair  Current Medications: Current Facility-Administered Medications  Medication Dose Route Frequency Provider Last Rate Last Dose  . acetaminophen (TYLENOL) tablet 650 mg  650 mg Oral Q6H PRN Rankin, Shuvon B, NP   650 mg at 01/30/18 1714  . alum & mag hydroxide-simeth (MAALOX/MYLANTA) 200-200-20 MG/5ML suspension 30 mL  30 mL Oral Q4H PRN Rankin, Shuvon B, NP   30 mL at 01/31/18 1822  . busPIRone (BUSPAR) tablet 10 mg  10 mg Oral TID Derrill Center, NP   10 mg at 02/01/18 1208  . chlordiazePOXIDE (LIBRIUM) capsule 25 mg  25 mg Oral Q6H PRN Derrill Center, NP       . chlordiazePOXIDE (LIBRIUM) capsule 25 mg  25 mg Oral TID Derrill Center, NP   25 mg at 02/01/18 1208   Followed by  . [START ON 02/02/2018] chlordiazePOXIDE (LIBRIUM) capsule 25 mg  25 mg Oral Bennye Alm, NP       Followed by  . [START ON 02/03/2018] chlordiazePOXIDE (LIBRIUM) capsule 25 mg  25 mg Oral Daily Lewis, Ludger Nutting N, NP      . feeding supplement (ENSURE ENLIVE) (ENSURE ENLIVE) liquid 237 mL  237 mL Oral BID PRN Izediuno, Vincent A, MD      . FLUoxetine (PROZAC) capsule 20 mg  20 mg Oral Daily Derrill Center, NP   20 mg  at 02/01/18 0826  . gabapentin (NEURONTIN) capsule 300 mg  300 mg Oral BID Derrill Center, NP   300 mg at 02/01/18 0827  . hydrOXYzine (ATARAX/VISTARIL) tablet 25 mg  25 mg Oral Q6H PRN Derrill Center, NP      . ibuprofen (ADVIL,MOTRIN) tablet 800 mg  800 mg Oral Q6H PRN Lindon Romp A, NP   800 mg at 01/29/18 2107  . levETIRAcetam (KEPPRA) tablet 500 mg  500 mg Oral BID Artist Beach, MD   500 mg at 02/01/18 0827  . loperamide (IMODIUM) capsule 2-4 mg  2-4 mg Oral PRN Derrill Center, NP      . magnesium hydroxide (MILK OF MAGNESIA) suspension 30 mL  30 mL Oral Daily PRN Rankin, Shuvon B, NP   30 mL at 01/31/18 0758  . multivitamin with minerals tablet 1 tablet  1 tablet Oral Daily Derrill Center, NP   1 tablet at 02/01/18 0827  . nicotine polacrilex (NICORETTE) gum 2 mg  2 mg Oral PRN Izediuno, Laruth Bouchard, MD      . ondansetron (ZOFRAN-ODT) disintegrating tablet 4 mg  4 mg Oral Q6H PRN Derrill Center, NP      . polyethylene glycol (MIRALAX / GLYCOLAX) packet 17 g  17 g Oral Daily Lindon Romp A, NP   17 g at 02/01/18 0610  . propranolol (INDERAL) tablet 20 mg  20 mg Oral BID Derrill Center, NP   20 mg at 02/01/18 0827  . QUEtiapine (SEROQUEL) tablet 200 mg  200 mg Oral QHS Patrecia Pour, NP   200 mg at 01/31/18 2133  . QUEtiapine (SEROQUEL) tablet 50 mg  50 mg Oral BID Derrill Center, NP   50 mg at 02/01/18 0826  . SUMAtriptan (IMITREX)  tablet 50 mg  50 mg Oral Daily PRN Lindon Romp A, NP   50 mg at 01/31/18 1419  . thiamine (VITAMIN B-1) tablet 100 mg  100 mg Oral Daily Derrill Center, NP   100 mg at 02/01/18 0086    Lab Results:  Results for orders placed or performed during the hospital encounter of 01/29/18 (from the past 48 hour(s))  TSH     Status: None   Collection Time: 02/01/18  6:17 AM  Result Value Ref Range   TSH 2.592 0.350 - 4.500 uIU/mL    Comment: Performed by a 3rd Generation assay with a functional sensitivity of <=0.01 uIU/mL. Performed at [facility], [place] [address], [place], [place] [address]   Hemoglobin A1c     Status: None   Collection Time: 02/01/18  6:17 AM  Result Value Ref Range   Hgb A1c MFr Bld 4.9 4.8 - 5.6 %    Comment: (NOTE) Pre diabetes:          5.7%-6.4% Diabetes:              >6.4% Glycemic control for   <7.0% adults with diabetes    Mean Plasma Glucose 93.93 mg/dL    Comment: Performed at [facility] [address] [place],  [address]  Lipid panel     Status: Abnormal   Collection Time: 02/01/18  6:17 AM  Result Value Ref Range   Cholesterol 199 0 - 200 mg/dL   Triglycerides 229 (H) <150 mg/dL   HDL 55 >40 mg/dL   Total CHOL/HDL Ratio 3.6 RATIO   VLDL 46 (H) 0 - 40 mg/dL   LDL Cholesterol 98 0 -  99 mg/dL    Comment:        Total Cholesterol/HDL:CHD Risk Coronary Heart Disease Risk Table                     Men   Women  1/2 Average Risk   3.4   3.3  Average Risk       5.0   4.4  2 X Average Risk   9.6   7.1  3 X Average Risk  23.4   11.0        Use the calculated Patient Ratio above and the CHD Risk Table to determine the patient's CHD Risk.        ATP III CLASSIFICATION (LDL):  <100     mg/dL   Optimal  100-129  mg/dL   Near or Above                    Optimal  130-159  mg/dL   Borderline  160-189  mg/dL   High  >190     mg/dL   Very High Performed at [facility] [address], [place], [place]  [address]     Blood Alcohol level:  Lab Results  Component Value Date   ETH <10 01/28/2018   ETH 139 (H) 17/40/8144    Metabolic Disorder Labs: Lab Results  Component Value Date   HGBA1C 4.9 02/01/2018   MPG 93.93 02/01/2018   MPG 97 12/05/2013   No results found for: PROLACTIN Lab Results  Component Value Date   CHOL 199 02/01/2018   TRIG 229 (H) 02/01/2018   HDL 55 02/01/2018   CHOLHDL 3.6 02/01/2018   VLDL 46 (H) 02/01/2018   LDLCALC 98 02/01/2018   LDLCALC 62 12/05/2013    Physical Findings: AIMS: Facial and Oral Movements Muscles of Facial Expression: None, normal Lips and Perioral Area: None, normal Jaw: None, normal Tongue: None, normal,Extremity Movements Upper (arms, wrists, hands, fingers): None, normal Lower (legs, knees, ankles, toes): None, normal, Trunk Movements Neck, shoulders, hips: None, normal, Overall Severity Severity of abnormal movements (highest score from questions above): None, normal Incapacitation due to abnormal movements: None, normal Patient's awareness of abnormal movements (rate only patient's report): No Awareness, Dental Status Current problems with teeth and/or dentures?: Yes(upper dentures fit, problems with bottom dentures) Does patient usually wear dentures?: Yes(6 teeth on bottom, denture does not fit)  CIWA:  CIWA-Ar Total: 2 COWS:  COWS Total Score: 0  Musculoskeletal: Strength & Muscle Tone: within normal limits Gait & Station: normal Patient leans: N/A  Psychiatric Specialty Exam: Physical Exam  Constitutional: He is oriented to person, place, and time. He appears well-developed.  Neurological: He is alert and oriented to person, place, and time.  Psychiatric: He has a normal mood and affect. His behavior is normal.    Review of Systems  Psychiatric/Behavioral: Positive for depression and substance abuse. Negative for hallucinations. The patient is nervous/anxious.   All other systems reviewed and are negative.    Blood pressure 115/72, pulse 80, temperature 98.6 F (37 C), resp. rate 16, height 6' (1.829 m), weight 80.3 kg (177 lb), SpO2 98 %.Body mass index is 24.01 kg/m.  General Appearance: Casual, pacing,reports symptoms of worry   Eye Contact:  Fair  Speech:  Clear and Coherent  Volume:  Normal  Mood:  Anxious and Depressed  Affect:  Congruent  Thought Process:  Coherent  Orientation:  Full (Time, Place, and Person)  Thought Content:  Hallucinations:  None  Suicidal Thoughts:  No  Homicidal Thoughts:  No  Memory:  Immediate;   Fair Recent;   Fair Remote;   Fair  Judgement:  Fair  Insight:  Fair  Psychomotor Activity:  Restlessness  Concentration:  Concentration: Fair  Recall:  [employer] of Knowledge:  Fair  Language:  Fair  Akathisia:  No  Handed:  Right  AIMS (if indicated):     Assets:  Communication Skills Desire for Improvement Financial Resources/Insurance Physical Health  ADL's:  Intact  Cognition:  WNL  Sleep:  Number of Hours: 5.25     Treatment Plan Summary: Daily contact with patient to assess and evaluate symptoms and progress in treatment and Medication management   Continue with current treatment plan on 02/01/2018 except where noted   - Major depressive disorder, recurrent, severe without psychosis - Continue Fluoxetine 20 mg daily for depression  - Anxiety  - Continue gabapentin 200 mg BID anxiety  - Insomnia/mood              - Continue Seroquel 50 mg PO BID and  200 mg at bedtime for sleep and mood  -Encourage participation in groups and therapeutic milieu Discharge planning will be ongoing- Education officer, museum to [facility]   Derrill Center, NP 02/01/2018, 4:17 PM
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
## 2018-02-01 NOTE — Plan of Care (Signed)
  Safety: Periods of time without injury will increase 02/01/2018 2324 - Progressing by Providence Crosby, RN Note Pt safe on the unit at this time

## 2018-02-01 NOTE — Tx Team (Signed)
Interdisciplinary Treatment and Diagnostic Plan Update  02/01/2018 Time of Session: 0830AM Spencer Mendoza MRN: [id number]  Principal Diagnosis: Substance induced mood disorder (Spencer Mendoza)  Secondary Diagnoses: Principal Problem:   Substance induced mood disorder (Spencer Mendoza) Active Problems:   Alcohol dependence with uncomplicated withdrawal (Spencer Mendoza)   Cocaine abuse (Spencer Mendoza)   MDD (major depressive disorder), single episode, severe , no psychosis (Spencer Mendoza)   Major depressive disorder, recurrent episode, severe (Spencer Mendoza)   Current Medications:  Current Facility-Administered Medications  Medication Dose Route Frequency Provider Last Rate Last Dose  . acetaminophen (TYLENOL) tablet 650 mg  650 mg Oral Q6H PRN Spencer Mendoza, Spencer B, NP   650 mg at 01/30/18 1714  . alum & mag hydroxide-simeth (MAALOX/MYLANTA) 200-200-20 MG/5ML suspension 30 mL  30 mL Oral Q4H PRN Spencer Mendoza, Spencer B, NP   30 mL at 01/31/18 1822  . busPIRone (BUSPAR) tablet 10 mg  10 mg Oral TID Spencer Center, NP   10 mg at 02/01/18 0827  . chlordiazePOXIDE (LIBRIUM) capsule 25 mg  25 mg Oral Q6H PRN Spencer [place], NP      . chlordiazePOXIDE (LIBRIUM) capsule 25 mg  25 mg Oral TID Spencer [place], NP   25 mg at 02/01/18 0827   Followed by  . [START ON 02/02/2018] chlordiazePOXIDE (LIBRIUM) capsule 25 mg  25 mg Oral Spencer Alm, NP       Followed by  . [START ON 02/03/2018] chlordiazePOXIDE (LIBRIUM) capsule 25 mg  25 mg Oral Daily Spencer Ala N, NP      . feeding supplement (ENSURE ENLIVE) (ENSURE ENLIVE) liquid 237 mL  237 mL Oral BID PRN Spencer Mendoza, Spencer A, MD      . FLUoxetine (PROZAC) capsule 20 mg  20 mg Oral Daily Spencer Center, NP   20 mg at 02/01/18 0826  . gabapentin (NEURONTIN) capsule 300 mg  300 mg Oral BID Spencer [place], NP   300 mg at 02/01/18 0827  . hydrOXYzine (ATARAX/VISTARIL) tablet 25 mg  25 mg Oral Q6H PRN Spencer [place], NP      . ibuprofen (ADVIL,MOTRIN) tablet 800 mg  800 mg Oral Q6H PRN Spencer Spencer A, NP    800 mg at 01/29/18 2107  . levETIRAcetam (KEPPRA) tablet 500 mg  500 mg Oral BID Spencer Beach, MD   500 mg at 02/01/18 0827  . loperamide (IMODIUM) capsule 2-4 mg  2-4 mg Oral PRN Spencer Center, NP      . magnesium hydroxide (MILK OF MAGNESIA) suspension 30 mL  30 mL Oral Daily PRN Spencer Mendoza, Spencer B, NP   30 mL at 01/31/18 0758  . multivitamin with minerals tablet 1 tablet  1 tablet Oral Daily Spencer Center, NP   1 tablet at 02/01/18 0827  . nicotine polacrilex (NICORETTE) gum 2 mg  2 mg Oral PRN Spencer Mendoza, Spencer Bouchard, MD      . ondansetron (ZOFRAN-ODT) disintegrating tablet 4 mg  4 mg Oral Q6H PRN Spencer [place], NP      . polyethylene glycol (MIRALAX / GLYCOLAX) packet 17 g  17 g Oral Daily Spencer Spencer A, NP   17 g at 02/01/18 0610  . propranolol (INDERAL) tablet 20 mg  20 mg Oral BID Spencer [place], NP   20 mg at 02/01/18 0827  . QUEtiapine (SEROQUEL) tablet 200 mg  200 mg Oral QHS Spencer Pour, NP   200 mg at 01/31/18 2133  . QUEtiapine (SEROQUEL) tablet 50 mg  50 mg Oral BID Spencer [place], NP   50 mg at 02/01/18 0826  . SUMAtriptan (IMITREX) tablet 50 mg  50 mg Oral Daily PRN Spencer Spencer A, NP   50 mg at 01/31/18 1419  . thiamine (VITAMIN Mendoza-1) tablet 100 mg  100 mg Oral Daily Spencer [place], NP   100 mg at 02/01/18 0827   PTA Medications: Medications Prior to Admission  Medication Sig Dispense Refill Last Dose  . aluminum-magnesium hydroxide-simethicone (MAALOX) 683-419-62 MG/5ML SUSP Take 30 mLs by mouth 3 (three) times daily as needed (INDIGESTION).   unknown at prn  . amitriptyline (ELAVIL) 25 MG tablet Take 3 tablets (75 mg total) by mouth at bedtime. For sleep (Patient taking differently: Take 150 mg by mouth at bedtime. For sleep) 90 tablet 0 01/27/2018 at Unknown time  . Aspirin-Salicylamide-Caffeine (BC HEADACHE POWDER PO) Take 1 Package by mouth as needed.   01/27/2018 at Unknown time  . busPIRone (BUSPAR) 10 MG tablet Take 10 mg by mouth 3 (three) times daily.    01/27/2018 at Unknown time  . FLUoxetine (PROZAC) 40 MG capsule Take 40 mg by mouth daily.   01/27/2018 at Unknown time  . ibuprofen (ADVIL,MOTRIN) 800 MG tablet Take 1 tablet (800 mg total) by mouth 3 (three) times daily. (Patient taking differently: Take 800 mg by mouth every 6 (six) hours as needed. ) 21 tablet 0 01/25/2018 at prn  . levETIRAcetam (KEPPRA) 500 MG tablet Take 500 mg by mouth 2 (two) times daily.   01/27/2018 at Unknown time  . Magnesium Hydroxide (MILK OF MAGNESIA PO) Take 30 mLs by mouth 3 (three) times daily as needed (CONSTIPATION).   Past Week at prn  . propranolol (INDERAL) 20 MG tablet Take 20 mg by mouth 3 (three) times daily.   01/27/2018 at Unknown time  . propranolol (INDERAL) 40 MG tablet Take 1 tablet (40 mg total) by mouth 2 (two) times daily. (Patient not taking: Reported on 01/22/2018) 60 tablet 0 Not Taking at Unknown time  . QUEtiapine (SEROQUEL) 100 MG tablet Take 100 mg by mouth See admin instructions. Take 100 mg in the morning at 12:00 and at 1600. 300 mg at 20:00   01/27/2018 at Unknown time  . SUMAtriptan (IMITREX) 50 MG tablet Take 50 mg by mouth daily as needed (MIGRAINES).    01/25/2018 at prn  . thiamine (VITAMIN Mendoza-1) 100 MG tablet Take 100 mg by mouth daily.    01/27/2018 at Unknown time  . [DISCONTINUED] acetaminophen (TYLENOL) 325 MG tablet Take 2 tablets (650 mg total) by mouth every 6 (six) hours as needed. 30 tablet 0 01/25/2018 at prn  . [DISCONTINUED] ALPRAZolam (XANAX) 0.25 MG tablet Take 0.25 mg by mouth at bedtime.    01/26/2018  . [DISCONTINUED] Bismuth Subsalicylate (KAOPECTATE PO) Take 30 mLs by mouth 3 (three) times daily as needed (DIARRHEA).   unknown at prn  . [DISCONTINUED] cyclobenzaprine (FLEXERIL) 10 MG tablet Take 1 tablet (10 mg total) by mouth 2 (two) times daily as needed for up to 10 days for muscle spasms. 20 tablet 0 01/27/2018 at prn  . [DISCONTINUED] hydrOXYzine (ATARAX/VISTARIL) 25 MG tablet Take 1 tablet (25 mg total) by mouth every 6  (six) hours as needed for anxiety. (Patient not taking: Reported on 01/22/2018) 30 tablet 0 Not Taking at Unknown time    Patient Stressors: Financial difficulties Health problems Marital or family conflict Medication change or noncompliance Substance abuse  Patient Strengths: Capable of independent living Curator  fund of knowledge Motivation for treatment/growth Physical Health  Treatment Modalities: Medication Management, Group therapy, Case management,  1 to 1 session with clinician, Psychoeducation, Recreational therapy.   Physician Treatment Plan for Primary Diagnosis: Substance induced mood disorder ([place]) Long Term Goal(s): Improvement in symptoms so as ready for discharge Improvement in symptoms so as ready for discharge   Short Term Goals: Ability to identify changes in lifestyle to reduce recurrence of condition will improve Ability to verbalize feelings will improve Ability to disclose and discuss suicidal ideas Ability to demonstrate self-control will improve Ability to identify and develop effective coping behaviors will improve Ability to maintain clinical measurements within normal limits will improve Compliance with prescribed medications will improve Ability to identify triggers associated with substance abuse/mental health issues will improve Ability to identify changes in lifestyle to reduce recurrence of condition will improve Ability to verbalize feelings will improve Ability to disclose and discuss suicidal ideas Ability to demonstrate self-control will improve Ability to identify and develop effective coping behaviors will improve Ability to maintain clinical measurements within normal limits will improve Compliance with prescribed medications will improve Ability to identify triggers associated with substance abuse/mental health issues will improve  Medication Management: Evaluate patient's response, side effects, and tolerance of  medication regimen.  Therapeutic Interventions: 1 to 1 sessions, Unit Group sessions and Medication administration.  Evaluation of Outcomes: Progressing  Physician Treatment Plan for Secondary Diagnosis: Principal Problem:   Substance induced mood disorder ([place]) Active Problems:   Alcohol dependence with uncomplicated withdrawal ([place])   Cocaine abuse ([place])   MDD (major depressive disorder), single episode, severe , no psychosis ([place])   Major depressive disorder, recurrent episode, severe ([place])  Long Term Goal(s): Improvement in symptoms so as ready for discharge Improvement in symptoms so as ready for discharge   Short Term Goals: Ability to identify changes in lifestyle to reduce recurrence of condition will improve Ability to verbalize feelings will improve Ability to disclose and discuss suicidal ideas Ability to demonstrate self-control will improve Ability to identify and develop effective coping behaviors will improve Ability to maintain clinical measurements within normal limits will improve Compliance with prescribed medications will improve Ability to identify triggers associated with substance abuse/mental health issues will improve Ability to identify changes in lifestyle to reduce recurrence of condition will improve Ability to verbalize feelings will improve Ability to disclose and discuss suicidal ideas Ability to demonstrate self-control will improve Ability to identify and develop effective coping behaviors will improve Ability to maintain clinical measurements within normal limits will improve Compliance with prescribed medications will improve Ability to identify triggers associated with substance abuse/mental health issues will improve     Medication Management: Evaluate patient's response, side effects, and tolerance of medication regimen.  Therapeutic Interventions: 1 to 1 sessions, Unit Group sessions and Medication administration.  Evaluation of Outcomes:  Progressing   RN Treatment Plan for Primary Diagnosis: Substance induced mood disorder ([place]) Long Term Goal(s): Knowledge of disease and therapeutic regimen to maintain health will improve  Short Term Goals: Ability to remain free from injury will improve, Ability to disclose and discuss suicidal ideas and Ability to identify and develop effective coping behaviors will improve  Medication Management: RN will administer medications as ordered by provider, will assess and evaluate patient's response and provide education to patient for prescribed medication. RN will report any adverse and/or side effects to prescribing provider.  Therapeutic Interventions: 1 on 1 counseling sessions, Psychoeducation, Medication administration, Evaluate responses to treatment, Monitor vital signs  and CBGs as ordered, Perform/monitor CIWA, COWS, AIMS and Fall Risk screenings as ordered, Perform wound care treatments as ordered.  Evaluation of Outcomes: Progressing   LCSW Treatment Plan for Primary Diagnosis: Substance induced mood disorder ([place]) Long Term Goal(s): Safe transition to appropriate next level of care at discharge, Engage patient in therapeutic group addressing interpersonal concerns.  Short Term Goals: Engage patient in aftercare planning with referrals and resources, Facilitate patient progression through stages of change regarding substance use diagnoses and concerns and Identify triggers associated with mental health/substance abuse issues  Therapeutic Interventions: Assess for all discharge needs, 1 to 1 time with Social worker, Explore available resources and support systems, Assess for adequacy in community support network, Educate family and significant other(s) on suicide prevention, Complete Psychosocial Assessment, Interpersonal group therapy.  Evaluation of Outcomes: Progressing   Progress in Treatment: Attending groups: Yes. Participating in groups: Yes. Taking medication as  prescribed: Yes. Toleration medication: Yes. Family/Significant other contact made: Pt declined to consent to collateral contact. SPE completed with pt.  Patient understands diagnosis: Yes. Discussing patient identified problems/goals with staff: Yes. Medical problems stabilized or resolved: Yes. Denies suicidal/homicidal ideation: Yes. Issues/concerns per patient self-inventory: No. Other: Mendoza/Mendoza   New problem(s) identified: No, Describe:  Mendoza/Mendoza  New Short Term/Long Term Goal(s): detox, medication management for mood stabilization; elimination of SI thoughts; development of comprehensive mental wellness/sobriety plan.   Discharge Plan or Barriers: CSW assessing for appropriate referrals. Pt may be interested in [place] provided. [place] pamphlet and AA/NA lists also provided to pt for additional community support.   Reason for Continuation of Hospitalization: Anxiety Depression Medication stabilization Suicidal ideation Withdrawal symptoms  Estimated Length of Stay: Wed, 02/03/18  Attendees: Patient: Spencer Mendoza  02/01/2018 8:46 AM  Physician: Dr. Sanjuana Letters MD 02/01/2018 8:46 AM  Nursing: [place]; Opal Sidles RN 02/01/2018 8:46 AM  RN Care Manager:x 02/01/2018 8:46 AM  Social Worker: Maxie Better, LCSW; Radonna Ricker LCSW-Mendoza 02/01/2018 8:46 AM  Recreational Therapist: x 02/01/2018 8:46 AM  Other: Lindell Spar NP; Darnelle Maffucci Money NP 02/01/2018 8:46 AM  Other:  02/01/2018 8:46 AM  Other: 02/01/2018 8:46 AM    Scribe for Treatment Team: [place], LCSW 02/01/2018 8:46 AM

## 2018-02-01 NOTE — [facility] Group Notes (Signed)
LCSW Group Therapy Note   02/01/2018 1:15pm   Type of Therapy and Topic:  Group Therapy:  Overcoming Obstacles   Participation Level:  Did Not Attend--pt chose to remain in bed.    Description of Group:    In this group patients will be encouraged to explore what they see as obstacles to their own wellness and recovery. They will be guided to discuss their thoughts, feelings, and behaviors related to these obstacles. The group will process together ways to cope with barriers, with attention given to specific choices patients can make. Each patient will be challenged to identify changes they are motivated to make in order to overcome their obstacles. This group will be process-oriented, with patients participating in exploration of their own experiences as well as giving and receiving support and challenge from other group members.   Therapeutic Goals: 1. Patient will identify personal and current obstacles as they relate to admission. 2. Patient will identify barriers that currently interfere with their wellness or overcoming obstacles.  3. Patient will identify feelings, thought process and behaviors related to these barriers. 4. Patient will identify two changes they are willing to make to overcome these obstacles:      Summary of Patient Progress   x   Therapeutic Modalities:   Cognitive Behavioral Therapy Solution Focused Therapy Motivational Interviewing Relapse Prevention Therapy  Kimber Relic Smart, LCSW 02/01/2018 12:23 PM

## 2018-02-01 NOTE — [facility] Group Notes (Signed)
[facility] Group Notes:  (Nursing/MHT/Case Management/Adjunct)  Date:  02/01/2018  Time:  10:14 AM  Type of Therapy:  Nurse Education  /  The group focuses on teaching patients who their staff is, what the staff's responsibilities are and what the unit programming / scheduling looks like.  Participation Level:  Active  Participation Quality:  Attentive  Affect:  Appropriate  Cognitive:  Appropriate  Insight:  Appropriate  Engagement in Group:  Engaged  Modes of Intervention:  Education  Summary of Progress/Problems:  Lauralyn Primes 02/01/2018, 10:14 AM

## 2018-02-01 NOTE — Progress Notes (Signed)
DAR NOTE: Patient presents with anxious affect and depressed mood. Pt complained of increased anxiety due to life. Reports fair night sleep, poor appetite, low energy, and good concentration. Denies pain, auditory and visual hallucinations.  Rates depression at 5, hopelessness at 5, and anxiety at 5.  Maintained on routine safety checks.  Medications given as prescribed.  Support and encouragement offered as needed.  Attended group and participated.  States goal for today is " long term treatment."  Patient observed socializing with peers in the dayroom.  Offered no complaint.

## 2018-02-02 LAB — PROLACTIN: Prolactin: 15.2 ng/mL (ref 4.0–15.2)

## 2018-02-02 NOTE — [facility] Group Notes (Signed)
[facility] [employer] Group Therapy 02/02/2018 1:15pm  Type of Therapy: Mental Health Association Presentation  Participation Level: Active  Participation Quality: Attentive  Affect: Appropriate  Cognitive: Oriented  Insight: Developing/Improving  Engagement in Therapy: Engaged  Modes of Intervention: Discussion, Education and Socialization  Summary of Progress/Problems: Spencer (Lindsay) Speaker came to talk about his personal journey with mental health. The pt processed ways by which to relate to the speaker. [place] speaker provided handouts and educational information pertaining to groups and services offered by the [facility]. Pt was engaged in speaker's presentation and was receptive to resources provided.    [employer], LCSW 02/02/2018 3:25 PM

## 2018-02-02 NOTE — Progress Notes (Signed)
D:Patient complains of anxiety, shakiness, and inability to sleep last night. He reports he wants to discuss medication changes with his Dr. today. Patient is considering progression in long term care facility in [place]. A:Offered support, encouragement, and 15 min checks.  R:Currently denies SI and HI. Safety maintained on the unit.

## 2018-02-02 NOTE — Progress Notes (Addendum)
Spencer Mendoza [facility] MD Progress Note  02/02/2018 2:07 PM Spencer Mendoza  MRN:  [id number]   Subjective:  Spencer Mendoza reports " IEverything is ok. They keep messing with my meds. I cant sleep at night. My biggest problem is worrying about going to long term. I know everyone at [facility]. Im scared what will happen if I cant get in and I go back on the streets.   Objective: Spencer Mendoza seen resting in dayroom.  Patient is alert and oriented x4.  He is observed participating in group and active in therapy may milieu.  He is continued to endorse a significant amount of anxiety due to discharge and relocation.  He is ruminating about the possibility of being discharged back to the street.  He is leaving the responsibility of placement up to social work, and he is encouraged to begin to take some accountability and Healthline placement in the event [place] does not work out for him.  He is advised to ask for another set of resources for long-term facilities to help with substance abuse.  He denies any depressive symptoms at this time, however endorses significant anxiety on a rating scale of 9 out of 10 with 10 being the worst.  He is reporting poor sleep as well as some eating disturbances.  He knows that he previously took 600 mg of Seroquel and is now only taking 200 mg of Seroquel and therefore is not resting well.  Patient reports he is medication complaint. Report he has concerns with discharges too soon. States he would like discharge "door to door."  Denies suic he denies any suicidal ideations homicidal ideations auditory or visual hallucinations and does not appear to be responding to internal stimuli at this time.   Principal Problem: Substance induced mood disorder ([place]) Diagnosis:   Patient Active Problem List   Diagnosis Date Noted  . Major depressive disorder, recurrent episode, severe ([place]) [F33.2] 01/30/2018  . Substance induced mood disorder ([place]) [F19.94] 01/30/2018  . MDD  (major depressive disorder), single episode, severe , no psychosis ([place]) [F32.2] 01/29/2018  . Hemiplegic migraine with status migrainosus [G43.401] 06/06/2015  . Chronic insomnia [F51.04] 06/06/2015  . Cocaine abuse ([place]) [F14.10] 05/22/2015  . Alcohol dependence with uncomplicated withdrawal ([place]) [F10.230]   . Headache, migraine [G43.909]   . Migraines [G43.909] 11/29/2014  . Headache [R51] 08/08/2014  . Seizures ([place]) [R56.9] 01/09/2014  . Chronic low back pain [M54.5, G89.29] 12/28/2013  . Hemiplegia, unspecified, affecting dominant side [G81.90] 12/06/2013  . CVA (cerebral infarction) [I63.9] 12/04/2013  . Leukocytosis [D72.829] 12/04/2013  . Acute Gastroenteritis [K52.9] 12/04/2013  . Hypertension [I10] 12/04/2013  . Hemiparesis ([place]) [G81.90] 12/04/2013  . Delirium tremens ([place]) [F10.231] 07/29/2013  . Alcohol withdrawal ([place]) [F10.239] 07/29/2013  . UGIB (upper gastrointestinal bleed) [K92.2] 07/29/2013  . Chest pain [R07.9] 07/28/2013   Total Time spent with patient: 20 minutes  Past Psychiatric History:   Past Medical History:  Past Medical History:  Diagnosis Date  . Allergy   . Anxiety   . Bipolar disorder ([place])   . Chronic insomnia 06/06/2015  . Depression   . Epilepsy ([place])   . Hemiplegic migraine with status migrainosus 06/06/2015  . Hypertension   . Migraine   . Stroke [facility])    patient denies  . Substance abuse [facility])     Past Surgical History:  Procedure Laterality Date  . APPENDECTOMY    . KNEE ARTHROSCOPY Right   . NOSE SURGERY  Family History:  Family History  Problem Relation Age of Onset  . Heart disease Mother   . Stroke Mother   . Epilepsy Mother   . Heart disease Father   . Hypertension Father   . Melanoma Father   . Migraines Father   . Colon cancer Sister   . Colon cancer Cousin    Family Psychiatric  History:  Social History:  Social History   Substance and Sexual Activity  Alcohol Use Yes  . Alcohol/week: 3.6 oz  .  Types: 6 Cans of beer per week   Comment: 6 drinks per day prior to one month ago     Social History   Substance and Sexual Activity  Drug Use Yes  . Frequency: 3.0 times per week  . Types: Marijuana, Heroin, Cocaine   Comment: crack, heroin    Social History   Socioeconomic History  . Marital status: Divorced    Spouse name: None  . Number of children: 0  . Years of education: GED  . Highest education level: None  Social Needs  . Financial resource strain: None  . Food insecurity - worry: None  . Food insecurity - inability: None  . Transportation needs - medical: None  . Transportation needs - non-medical: None  Occupational History  . Occupation: disabled  Tobacco Use  . Smoking status: Current Every Day Smoker    Packs/day: 1.00    Years: 15.00    Pack years: 15.00    Types: Cigarettes    Last attempt to quit: 03/20/2015    Years since quitting: 2.8  . Smokeless tobacco: Never Used  Substance and Sexual Activity  . Alcohol use: Yes    Alcohol/week: 3.6 oz    Types: 6 Cans of beer per week    Comment: 6 drinks per day prior to one month ago  . Drug use: Yes    Frequency: 3.0 times per week    Types: Marijuana, Heroin, Cocaine    Comment: crack, heroin  . Sexual activity: Yes    Birth control/protection: Condom  Other Topics Concern  . None  Social History Narrative   Pt lives with significant other.    Patient drinks 2 cups of caffeine daily.   Patient is right handed.   Additional Social History:    Pain Medications: see home medications list Prescriptions: see home medication list Over the Counter: see home medication list History of alcohol / drug use?: Yes Longest period of sobriety (when/how long): unsure Negative Consequences of Use: Financial, Personal relationships, Work / Youth worker Withdrawal Symptoms: Other (Comment)(anxiety, depression) Name of Substance 1: alcohol 1 - Age of First Use: 57 yrs old, first beer 1 - Amount (size/oz): daily  drinking alcohol 1 - Frequency: daily 1 - Duration: many years 1 - Last Use / Amount: 01/27/2018                  Sleep: Fair  Appetite:  Fair  Current Medications: Current Facility-Administered Medications  Medication Dose Route Frequency Provider Last Rate Last Dose  . acetaminophen (TYLENOL) tablet 650 mg  650 mg Oral Q6H PRN Rankin, Shuvon B, NP   650 mg at 02/02/18 0607  . alum & mag hydroxide-simeth (MAALOX/MYLANTA) 200-200-20 MG/5ML suspension 30 mL  30 mL Oral Q4H PRN Rankin, Shuvon B, NP   30 mL at 01/31/18 1822  . busPIRone (BUSPAR) tablet 10 mg  10 mg Oral TID Derrill Center, NP   10 mg at 02/02/18 1149  .  chlordiazePOXIDE (LIBRIUM) capsule 25 mg  25 mg Oral Q6H PRN Derrill Center, NP      . chlordiazePOXIDE (LIBRIUM) capsule 25 mg  25 mg Oral Bennye Alm, NP   25 mg at 02/02/18 7341   Followed by  . [START ON 02/03/2018] chlordiazePOXIDE (LIBRIUM) capsule 25 mg  25 mg Oral Daily Ricky Ala N, NP      . feeding supplement (ENSURE ENLIVE) (ENSURE ENLIVE) liquid 237 mL  237 mL Oral BID PRN Izediuno, Vincent A, MD      . FLUoxetine (PROZAC) capsule 20 mg  20 mg Oral Daily Derrill Center, NP   20 mg at 02/02/18 9379  . gabapentin (NEURONTIN) capsule 300 mg  300 mg Oral BID Derrill Center, NP   300 mg at 02/02/18 0240  . hydrOXYzine (ATARAX/VISTARIL) tablet 25 mg  25 mg Oral Q6H PRN Derrill Center, NP      . ibuprofen (ADVIL,MOTRIN) tablet 800 mg  800 mg Oral Q6H PRN Lindon Romp A, NP   800 mg at 01/29/18 2107  . levETIRAcetam (KEPPRA) tablet 500 mg  500 mg Oral BID Artist Beach, MD   500 mg at 02/02/18 0821  . loperamide (IMODIUM) capsule 2-4 mg  2-4 mg Oral PRN Derrill Center, NP      . magnesium hydroxide (MILK OF MAGNESIA) suspension 30 mL  30 mL Oral Daily PRN Rankin, Shuvon B, NP   30 mL at 01/31/18 0758  . multivitamin with minerals tablet 1 tablet  1 tablet Oral Daily Derrill Center, NP   1 tablet at 02/02/18 [id number]  . nicotine polacrilex  (NICORETTE) gum 2 mg  2 mg Oral PRN Izediuno, Vincent A, MD      . ondansetron (ZOFRAN-ODT) disintegrating tablet 4 mg  4 mg Oral Q6H PRN Derrill Center, NP      . polyethylene glycol (MIRALAX / GLYCOLAX) packet 17 g  17 g Oral Daily Lindon Romp A, NP   17 g at 02/01/18 0610  . propranolol (INDERAL) tablet 20 mg  20 mg Oral BID Derrill Center, NP   20 mg at 02/02/18 3299  . QUEtiapine (SEROQUEL) tablet 200 mg  200 mg Oral QHS Patrecia Pour, NP   200 mg at 01/31/18 2133  . QUEtiapine (SEROQUEL) tablet 50 mg  50 mg Oral BID Derrill Center, NP   50 mg at 02/02/18 2426  . SUMAtriptan (IMITREX) tablet 50 mg  50 mg Oral Daily PRN Lindon Romp A, NP   50 mg at 01/31/18 1419  . thiamine (VITAMIN B-1) tablet 100 mg  100 mg Oral Daily Derrill Center, NP   100 mg at 02/02/18 8341    Lab Results:  Results for orders placed or performed during the hospital encounter of 01/29/18 (from the past 48 hour(s))  TSH     Status: None   Collection Time: 02/01/18  6:17 AM  Result Value Ref Range   TSH 2.592 0.350 - 4.500 uIU/mL    Comment: Performed by a 3rd Generation assay with a functional sensitivity of <=0.01 uIU/mL. Performed at [facility], [place] [address], [place], [place] [address]   Hemoglobin A1c     Status: None   Collection Time: 02/01/18  6:17 AM  Result Value Ref Range   Hgb A1c MFr Bld 4.9 4.8 - 5.6 %    Comment: (NOTE) Pre diabetes:          5.7%-6.4% Diabetes:              >  6.4% Glycemic control for   <7.0% adults with diabetes    Mean Plasma Glucose 93.93 mg/dL    Comment: Performed at [facility] [address], [place], [place] [address]  Lipid panel     Status: Abnormal   Collection Time: 02/01/18  6:17 AM  Result Value Ref Range   Cholesterol 199 0 - 200 mg/dL   Triglycerides 229 (H) <150 mg/dL   HDL 55 >40 mg/dL   Total CHOL/HDL Ratio 3.6 RATIO   VLDL 46 (H) 0 - 40 mg/dL   LDL Cholesterol 98 0 - 99 mg/dL    Comment:        Total  Cholesterol/HDL:CHD Risk Coronary Heart Disease Risk Table                     Men   Women  1/2 Average Risk   3.4   3.3  Average Risk       5.0   4.4  2 X Average Risk   9.6   7.1  3 X Average Risk  23.4   11.0        Use the calculated Patient Ratio above and the CHD Risk Table to determine the patient's CHD Risk.        ATP III CLASSIFICATION (LDL):  <100     mg/dL   Optimal  100-129  mg/dL   Near or Above                    Optimal  130-159  mg/dL   Borderline  160-189  mg/dL   High  >190     mg/dL   Very High Performed at [facility] [address] [place], [place] [address]   Prolactin     Status: None   Collection Time: 02/01/18  6:17 AM  Result Value Ref Range   Prolactin 15.2 4.0 - 15.2 ng/mL    Comment: (NOTE) Performed At: [facility] [place], [place] [id number] Rush Farmer MD [id number] Performed at [facility], [place] [address], [place], [place] [address]     Blood Alcohol level:  Lab Results  Component Value Date   ETH <10 01/28/2018   ETH 139 (H) 51/70/0174    Metabolic Disorder Labs: Lab Results  Component Value Date   HGBA1C 4.9 02/01/2018   MPG 93.93 02/01/2018   MPG 97 12/05/2013   Lab Results  Component Value Date   PROLACTIN 15.2 02/01/2018   Lab Results  Component Value Date   CHOL 199 02/01/2018   TRIG 229 (H) 02/01/2018   HDL 55 02/01/2018   CHOLHDL 3.6 02/01/2018   VLDL 46 (H) 02/01/2018   LDLCALC 98 02/01/2018   LDLCALC 62 12/05/2013    Physical Findings: AIMS: Facial and Oral Movements Muscles of Facial Expression: None, normal Lips and Perioral Area: None, normal Jaw: None, normal Tongue: None, normal,Extremity Movements Upper (arms, wrists, hands, fingers): None, normal Lower (legs, knees, ankles, toes): None, normal, Trunk Movements Neck, shoulders, hips: None, normal, Overall Severity Severity of abnormal movements (highest score from questions  above): None, normal Incapacitation due to abnormal movements: None, normal Patient's awareness of abnormal movements (rate only patient's report): No Awareness, Dental Status Current problems with teeth and/or dentures?: Yes(upper dentures fit, problems with bottom dentures) Does patient usually wear dentures?: Yes(6 teeth on bottom, denture does not fit)  CIWA:  CIWA-Ar Total: 3 COWS:  COWS Total Score: 0  Musculoskeletal:  Strength & Muscle Tone: within normal limits Gait & Station: normal Patient leans: N/A  Psychiatric Specialty Exam: Physical Exam  Constitutional: He is oriented to person, place, and time. He appears well-developed.  Neurological: He is alert and oriented to person, place, and time.  Psychiatric: He has a normal mood and affect. His behavior is normal.    Review of Systems  Psychiatric/Behavioral: Positive for depression and substance abuse. Negative for hallucinations. The patient is nervous/anxious.   All other systems reviewed and are negative.   Blood pressure (!) 88/69, pulse 64, temperature 98 F (36.7 C), resp. rate 18, height 6' (1.829 m), weight 80.3 kg (177 lb), SpO2 98 %.Body mass index is 24.01 kg/m.  General Appearance: Casual, pacing,reports symptoms of worry   Eye Contact:  Fair  Speech:  Clear and Coherent  Volume:  Normal  Mood:  Anxious  Affect:  Congruent  Thought Process:  Coherent, Linear and Descriptions of Associations: Intact  Orientation:  Full (Time, Place, and Person)  Thought Content:  WDL and Rumination  Suicidal Thoughts:  No  Homicidal Thoughts:  No  Memory:  Immediate;   Fair Recent;   Fair Remote;   Fair  Judgement:  Fair  Insight:  Fair  Psychomotor Activity:  Restlessness  Concentration:  Concentration: Fair  Recall:  [employer] of Knowledge:  Fair  Language:  Fair  Akathisia:  No  Handed:  Right  AIMS (if indicated):     Assets:  Communication Skills Desire for Improvement Financial  Resources/Insurance Physical Health  ADL's:  Intact  Cognition:  WNL  Sleep:  Number of Hours: 5.5     Treatment Plan Summary: Daily contact with patient to assess and evaluate symptoms and progress in treatment and Medication management   Continue with current treatment plan on 02/01/2018 except where noted   - Major depressive disorder, recurrent, severe without psychosis - Continue Fluoxetine 20 mg daily for depression  - Anxiety  - Continue gabapentin 200 mg BID anxiety  - Insomnia/mood              - Continue Seroquel 50 mg PO BID and  200 mg at bedtime for sleep and mood  -Encourage participation in groups and therapeutic milieu Discharge planning will be ongoing- Education officer, museum to fax a referral to progressive   Nanci Pina, [place] 02/02/2018, 2:07 PM   Agree with NP Progress Note

## 2018-02-02 NOTE — Progress Notes (Signed)
Patient ID: Spencer Mendoza, male   DOB: 08/24/1961, 57 y.o.   MRN: [id number] PER STATE REGULATIONS 482.30  THIS CHART WAS REVIEWED FOR MEDICAL NECESSITY WITH RESPECT TO THE PATIENT'S ADMISSION/ DURATION OF STAY.  NEXT REVIEW DATE: 02/06/2018  Chauncy Lean, RN, BSN CASE MANAGER

## 2018-02-02 NOTE — Progress Notes (Signed)
[facility] Group Notes:  (Nursing/MHT/Case Management/Adjunct)  Date:  02/02/2018  Time:  5:43 PM  Type of Therapy:  Nurse Education  Participation Level:  Did Not Attend  Participation Quality:    Affect:    Cognitive:    Insight:    Engagement in Group:    Modes of Intervention:    Summary of Progress/Problems:  Mosie Lukes 02/02/2018, 5:43 PM

## 2018-02-02 NOTE — Progress Notes (Signed)
Pt attended morning orientation/goals group and stated his goal is to work towards learning more information about his voluntary treatment in [place].

## 2018-02-02 NOTE — [facility] Suicide Risk Assessment (Signed)
[place] INPATIENT:  Family/Significant Other Suicide Prevention Education  Suicide Prevention Education:  Education Completed; [name] [phone]) has been identified by the patient as the family member/significant other with whom the patient will be residing, and identified as the person(s) who will aid the patient in the event of a mental health crisis (suicidal ideations/suicide attempt).  With written consent from the patient, the family member/significant other has been provided the following suicide prevention education, prior to the and/or following the discharge of the patient.  The suicide prevention education provided includes the following:  Suicide risk factors  Suicide prevention and interventions  National Suicide Hotline telephone number  [facility] assessment telephone number  [facility] Emergency Assistance [place] and/or Residential Mobile Crisis Unit telephone number  Request made of family/significant other to:  Remove weapons (e.g., guns, rifles, knives), all items previously/currently identified as safety concern.    Remove drugs/medications (over-the-counter, prescriptions, illicit drugs), all items previously/currently identified as a safety concern.  The family member/significant other verbalizes understanding of the suicide prevention education information provided.  The family member/significant other agrees to remove the items of safety concern listed above.  [name] 02/02/2018, 10:27 AM

## 2018-02-02 NOTE — Progress Notes (Signed)
The patient attended last night's A.A.meeting and was appropriate.

## 2018-02-03 MED ORDER — TRAZODONE HCL 100 MG PO TABS
100.0000 mg | ORAL_TABLET | Freq: Every day | ORAL | Status: DC
  Administered 2018-02-03 – 2018-02-05 (×3): 100 mg via ORAL
  Filled 2018-02-03 (×5): qty 1

## 2018-02-03 NOTE — [facility] Group Notes (Signed)
LCSW Group Therapy Note 02/03/2018 3:19 PM  Type of Therapy and Topic: Group Therapy: Avoiding Self-Sabotaging and Enabling Behaviors  Participation Level: Did Not Attend  Description of Group:  In this group, patients will learn how to identify obstacles, self-sabotaging and enabling behaviors, as well as: what are they, why do we do them and what needs these behaviors meet. Discuss unhealthy relationships and how to have positive healthy boundaries with those that sabotage and enable. Explore aspects of self-sabotage and enabling in yourself and how to limit these self-destructive behaviors in everyday life.  Therapeutic Goals: 1. Patient will identify one obstacle that relates to self-sabotage and enabling behaviors 2. Patient will identify one personal self-sabotaging or enabling behavior they did prior to admission 3. Patient will state a plan to change the above identified behavior 4. Patient will demonstrate ability to communicate their needs through discussion and/or role play.   Summary of Patient Progress:  Invited, chose not to attend.    Therapeutic Modalities:  Cognitive Behavioral Therapy Person-Centered Therapy Motivational Interviewing   [place] Clinical Social Worker

## 2018-02-03 NOTE — Progress Notes (Signed)
DAR NOTE: Patient presents with anxious affect and depressed mood.  Denies pain, auditory and visual hallucinations.  Complained about his sleep medication and would like it to be increased. Rates depression at 5, hopelessness at 5, and anxiety at 5.  Maintained on routine safety checks.  Medications given as prescribed.  Support and encouragement offered as needed. States goal for today is "Getting into long term treatment ."  Patient observed socializing with peers in the dayroom.  Offered no complaint.

## 2018-02-03 NOTE — Progress Notes (Addendum)
Spencer Mendoza [facility] MD Progress Note  02/03/2018 12:15 PM Spencer Mendoza  MRN:  [id number]   Subjective:  Spencer Mendoza reports "I had an [facility] with [facility] [place]. It is a 4-6 month program and that is what I need to get better. We discussed transportation options and they sound pretty assured that I have been accepted it is just getting my Medication recorded updated. I am not allowed to take Xanax or Imitrex. "   Objective: Spencer Mendoza seen resting in dayroom.  Patient is alert and oriented x4.  He is observed participating in group and active in therapy may milieu. He reports increased insight and improved judgement. He is now able to endorse his reasons to live and why he is more hopeful about his recovery. " I have a chance to get off this daily pattern of alcohol and drugs. I will learn how to be able to live sober. I would like to give it a try. Spencer Mendoza never been sober for 4 months ever."  He continues to endorse a significant amount of anxiety due to fear of what's next. He denies any depressive symptoms at this time, however endorses significant anxiety on a rating scale of 7 out of 10 with 10 being the worst. " I wish they would give me something for sleep at night."  He is reporting poor sleep as well as some eating disturbances. He reports getting about 4 hours of sleep or less per night. He is in agreement that we can start Trazodone to supplement with Seroquel. Patient reports he is medication complaint. He is able to tolerate these medications with no side effects. "  Denies  any suicidal ideations homicidal ideations auditory or visual hallucinations and does not appear to be responding to internal stimuli at this time.   Principal Problem: Substance induced mood disorder ([place]) Diagnosis:   Patient Active Problem List   Diagnosis Date Noted  . Major depressive disorder, recurrent episode, severe ([place]) [F33.2] 01/30/2018  . Substance induced mood  disorder ([place]) [F19.94] 01/30/2018  . MDD (major depressive disorder), single episode, severe , no psychosis ([place]) [F32.2] 01/29/2018  . Hemiplegic migraine with status migrainosus [G43.401] 06/06/2015  . Chronic insomnia [F51.04] 06/06/2015  . Cocaine abuse ([place]) [F14.10] 05/22/2015  . Alcohol dependence with uncomplicated withdrawal ([place]) [F10.230]   . Headache, migraine [G43.909]   . Migraines [G43.909] 11/29/2014  . Headache [R51] 08/08/2014  . Seizures ([place]) [R56.9] 01/09/2014  . Chronic low back pain [M54.5, G89.29] 12/28/2013  . Hemiplegia, unspecified, affecting dominant side [G81.90] 12/06/2013  . CVA (cerebral infarction) [I63.9] 12/04/2013  . Leukocytosis [D72.829] 12/04/2013  . Acute Gastroenteritis [K52.9] 12/04/2013  . Hypertension [I10] 12/04/2013  . Hemiparesis ([place]) [G81.90] 12/04/2013  . Delirium tremens ([place]) [F10.231] 07/29/2013  . Alcohol withdrawal ([place]) [F10.239] 07/29/2013  . UGIB (upper gastrointestinal bleed) [K92.2] 07/29/2013  . Chest pain [R07.9] 07/28/2013   Total Time spent with patient: 20 minutes  Past Psychiatric History:   Past Medical History:  Past Medical History:  Diagnosis Date  . Allergy   . Anxiety   . Bipolar disorder ([place])   . Chronic insomnia 06/06/2015  . Depression   . Epilepsy ([place])   . Hemiplegic migraine with status migrainosus 06/06/2015  . Hypertension   . Migraine   . Stroke [facility])    patient denies  . Substance abuse [facility])     Past Surgical History:  Procedure Laterality Date  . APPENDECTOMY    . KNEE  ARTHROSCOPY Right   . NOSE SURGERY     Family History:  Family History  Problem Relation Age of Onset  . Heart disease Mother   . Stroke Mother   . Epilepsy Mother   . Heart disease Father   . Hypertension Father   . Melanoma Father   . Migraines Father   . Colon cancer Sister   . Colon cancer Cousin    Family Psychiatric  History:  Social History:  Social History   Substance and Sexual Activity  Alcohol  Use Yes  . Alcohol/week: 3.6 oz  . Types: 6 Cans of beer per week   Comment: 6 drinks per day prior to one month ago     Social History   Substance and Sexual Activity  Drug Use Yes  . Frequency: 3.0 times per week  . Types: Marijuana, Heroin, Cocaine   Comment: crack, heroin    Social History   Socioeconomic History  . Marital status: Divorced    Spouse name: None  . Number of children: 0  . Years of education: GED  . Highest education level: None  Social Needs  . Financial resource strain: None  . Food insecurity - worry: None  . Food insecurity - inability: None  . Transportation needs - medical: None  . Transportation needs - non-medical: None  Occupational History  . Occupation: disabled  Tobacco Use  . Smoking status: Current Every Day Smoker    Packs/day: 1.00    Years: 15.00    Pack years: 15.00    Types: Cigarettes    Last attempt to quit: 03/20/2015    Years since quitting: 2.8  . Smokeless tobacco: Never Used  Substance and Sexual Activity  . Alcohol use: Yes    Alcohol/week: 3.6 oz    Types: 6 Cans of beer per week    Comment: 6 drinks per day prior to one month ago  . Drug use: Yes    Frequency: 3.0 times per week    Types: Marijuana, Heroin, Cocaine    Comment: crack, heroin  . Sexual activity: Yes    Birth control/protection: Condom  Other Topics Concern  . None  Social History Narrative   Pt lives with significant other.    Patient drinks 2 cups of caffeine daily.   Patient is right handed.   Additional Social History:    Pain Medications: see home medications list Prescriptions: see home medication list Over the Counter: see home medication list History of alcohol / drug use?: Yes Longest period of sobriety (when/how long): unsure Negative Consequences of Use: Financial, Personal relationships, Work / Youth worker Withdrawal Symptoms: Other (Comment)(anxiety, depression) Name of Substance 1: alcohol 1 - Age of First Use: 57 yrs old, first  beer 1 - Amount (size/oz): daily drinking alcohol 1 - Frequency: daily 1 - Duration: many years 1 - Last Use / Amount: 01/27/2018                  Sleep: Fair  Appetite:  Fair  Current Medications: Current Facility-Administered Medications  Medication Dose Route Frequency Provider Last Rate Last Dose  . acetaminophen (TYLENOL) tablet 650 mg  650 mg Oral Q6H PRN Rankin, Shuvon B, NP   650 mg at 02/03/18 0816  . alum & mag hydroxide-simeth (MAALOX/MYLANTA) 200-200-20 MG/5ML suspension 30 mL  30 mL Oral Q4H PRN Rankin, Shuvon B, NP   30 mL at 02/02/18 2110  . busPIRone (BUSPAR) tablet 10 mg  10 mg Oral TID Ricky Ala  N, NP   10 mg at 02/03/18 1203  . feeding supplement (ENSURE ENLIVE) (ENSURE ENLIVE) liquid 237 mL  237 mL Oral BID PRN Izediuno, Vincent A, MD      . FLUoxetine (PROZAC) capsule 20 mg  20 mg Oral Daily Derrill Center, NP   20 mg at 02/03/18 0814  . gabapentin (NEURONTIN) capsule 300 mg  300 mg Oral BID Derrill Center, NP   300 mg at 02/03/18 0817  . ibuprofen (ADVIL,MOTRIN) tablet 800 mg  800 mg Oral Q6H PRN Lindon Romp A, NP   800 mg at 01/29/18 2107  . levETIRAcetam (KEPPRA) tablet 500 mg  500 mg Oral BID Artist [place], MD   500 mg at 02/03/18 0814  . magnesium hydroxide (MILK OF MAGNESIA) suspension 30 mL  30 mL Oral Daily PRN Rankin, Shuvon B, NP   30 mL at 01/31/18 0758  . multivitamin with minerals tablet 1 tablet  1 tablet Oral Daily Derrill Center, NP   1 tablet at 02/03/18 6759  . nicotine polacrilex (NICORETTE) gum 2 mg  2 mg Oral PRN Izediuno, Laruth Bouchard, MD      . polyethylene glycol (MIRALAX / GLYCOLAX) packet 17 g  17 g Oral Daily Lindon Romp A, NP   17 g at 02/03/18 0816  . propranolol (INDERAL) tablet 20 mg  20 mg Oral BID Derrill Center, NP   20 mg at 02/03/18 0815  . QUEtiapine (SEROQUEL) tablet 200 mg  200 mg Oral QHS Patrecia Pour, NP   200 mg at 02/02/18 2112  . QUEtiapine (SEROQUEL) tablet 50 mg  50 mg Oral BID Derrill Center, NP    50 mg at 02/03/18 0814  . SUMAtriptan (IMITREX) tablet 50 mg  50 mg Oral Daily PRN Lindon Romp A, NP   50 mg at 01/31/18 1419  . thiamine (VITAMIN B-1) tablet 100 mg  100 mg Oral Daily Derrill Center, NP   100 mg at 02/03/18 1638    Lab Results:  No results found for this or any previous visit (from the past 48 hour(s)).  Blood Alcohol level:  Lab Results  Component Value Date   ETH <10 01/28/2018   ETH 139 (H) 46/65/9935    Metabolic Disorder Labs: Lab Results  Component Value Date   HGBA1C 4.9 02/01/2018   MPG 93.93 02/01/2018   MPG 97 12/05/2013   Lab Results  Component Value Date   PROLACTIN 15.2 02/01/2018   Lab Results  Component Value Date   CHOL 199 02/01/2018   TRIG 229 (H) 02/01/2018   HDL 55 02/01/2018   CHOLHDL 3.6 02/01/2018   VLDL 46 (H) 02/01/2018   LDLCALC 98 02/01/2018   LDLCALC 62 12/05/2013    Physical Findings: AIMS: Facial and Oral Movements Muscles of Facial Expression: None, normal Lips and Perioral Area: None, normal Jaw: None, normal Tongue: None, normal,Extremity Movements Upper (arms, wrists, hands, fingers): None, normal Lower (legs, knees, ankles, toes): None, normal, Trunk Movements Neck, shoulders, hips: None, normal, Overall Severity Severity of abnormal movements (highest score from questions above): None, normal Incapacitation due to abnormal movements: None, normal Patient's awareness of abnormal movements (rate only patient's report): No Awareness, Dental Status Current problems with teeth and/or dentures?: Yes(upper dentures fit, problems with bottom dentures) Does patient usually wear dentures?: Yes(6 teeth on bottom, denture does not fit)  CIWA:  CIWA-Ar Total: 1 COWS:  COWS Total Score: 0  Musculoskeletal: Strength & Muscle Tone: within normal limits  Gait & Station: normal Patient leans: N/A  Psychiatric Specialty Exam: Physical Exam  Constitutional: He is oriented to person, place, and time. He appears  well-developed.  Neurological: He is alert and oriented to person, place, and time.  Psychiatric: He has a normal mood and affect. His behavior is normal.    Review of Systems  Psychiatric/Behavioral: Positive for depression and substance abuse. Negative for hallucinations. The patient is nervous/anxious.   All other systems reviewed and are negative.   Blood pressure 109/75, pulse 71, temperature 98.1 F (36.7 C), temperature source Oral, resp. rate 18, height 6' (1.829 m), weight 80.3 kg (177 lb), SpO2 98 %.Body mass index is 24.01 kg/m.  General Appearance: Casual, reports symptoms of worry   Eye Contact:  Fair  Speech:  Clear and Coherent  Volume:  Normal  Mood:  Anxious  Affect:  Congruent  Thought Process:  Coherent, Linear and Descriptions of Associations: Intact  Orientation:  Full (Time, Place, and Person)  Thought Content:  WDL and Rumination  Suicidal Thoughts:  No  Homicidal Thoughts:  No  Memory:  Immediate;   Fair Recent;   Fair Remote;   Fair  Judgement:  Fair  Insight:  Fair  Psychomotor Activity:  Restlessness  Concentration:  Concentration: Fair  Recall:  [employer] of Knowledge:  Fair  Language:  Fair  Akathisia:  No  Handed:  Right  AIMS (if indicated):     Assets:  Communication Skills Desire for Improvement Financial Resources/Insurance Physical Health  ADL's:  Intact  Cognition:  WNL  Sleep:  Number of Hours: 6     Treatment Plan Summary: Daily contact with patient to assess and evaluate symptoms and progress in treatment and Medication management   Continue with current treatment plan on 02/01/2018 except where noted   - Major depressive disorder, recurrent, severe without psychosis - Continue Fluoxetine 20 mg daily for depression  - Anxiety  - Continue gabapentin 200 mg BID anxiety  - Insomnia/mood              - Continue Seroquel 50 mg PO BID and  200 mg at bedtime for sleep and mood -will add Trazodone  100mg  po qhs  -Encourage participation in groups and therapeutic milieu Discharge planning will be ongoing- Education officer, museum to fax a referral to [facility]. Update medication chart to remove Imitrex. Xanax has not been given this hospital stay.   Nanci Pina, FNP 02/03/2018, 12:15 PM  Agree with NP Progress Note

## 2018-02-03 NOTE — Progress Notes (Signed)
Recreation Therapy Notes  Date: 3.20.19 Time: 9:30 a.m. Location: [address] Dayroom  Group Topic: Stress Management  Goal Area(s) Addresses:  Goal 1.1: To reduce stress  -Patient will report feeling a reduction in stress level  -Patient will identify the importance of stress management  -Patient will participate during stress management group treatment    Intervention: Stress Management  Activity: Guided Imagery- Patients were in a peaceful environment with soft lighting enhancing patients mood. Patients were read a guided imagery script to promote relaxation   Education: Stress Management, Discharge Planning.   Education Outcome: Acknowledges edcuation/In group clarification offered/Needs additional education  Clinical Observations/Feedback:: Patient did not attend   Ranell Patrick, Recreation Therapy Intern   Ranell Patrick 02/03/2018 10:58 AM

## 2018-02-03 NOTE — [facility] Group Notes (Signed)
[facility] Group Notes:  (Nursing/MHT/Case Management/Adjunct)  Date:  02/03/2018  Time:  6:32 PM Type of Therapy:  Psychoeducational Skills  Participation Level:  Active  Participation Quality:  Appropriate  Affect:  Appropriate  Cognitive:  Appropriate  Insight:  Appropriate  Engagement in Group:  Engaged  Modes of Intervention:  Problem-solving  Summary of Progress/Problems: Pt talked about their coping skills and their experience here in the hospital.   Wolfgang Phoenix 02/03/2018, 6:32 PM

## 2018-02-03 NOTE — Progress Notes (Signed)
Patient ID: Spencer Mendoza, male   DOB: November 16, 1961, 57 y.o.   MRN: [id number]  D: Patient in room on approach. Pt reports he is doing well. Pt  Reports he is hopeful about getting into a treatment center in [place]. Pt reports he does not want to go anywhere locally because he will run into most of the people he used to hang with. Pt reports he needs a change of environment. Pt denies SI/HI/AVH and pain. Pt attended and participated in evening wrap up group. Cooperative with assessment. No acute distressed noted at this time.  A: Medications administered as prescribed. Support and encouragement provided as needed.  R: Patient remains safe and complaint with medications.

## 2018-02-04 MED ORDER — QUETIAPINE FUMARATE 300 MG PO TABS
300.0000 mg | ORAL_TABLET | Freq: Every day | ORAL | Status: DC
  Administered 2018-02-04 – 2018-02-05 (×2): 300 mg via ORAL
  Filled 2018-02-04 (×3): qty 1

## 2018-02-04 NOTE — Progress Notes (Addendum)
[facility] MD Progress Note  02/04/2018 11:44 AM Spencer Mendoza  MRN:  [id number]   Subjective: Patient reports partial but ongoing improvement compared to how he felt prior to admission. States he continues to feel anxious, which he states has been a chronic and ongoing issue for him. States he had a phone interview with staff at [facility] ( residential rehab in [place] that he is hoping to go to at discharge ), and is hoping he will be accepted, but anxious about not knowing result of interview yet. Reports Seroquel has historically been an effective medication for his mood and anxiety symptoms, and states " I feel like I am not on enough Seroquel right now, I have been on higher doses before and it helps better ". States he did not have side effects from Seroquel in the past . Denies medication side effects or drug drug interactions at this time. Currently denies suicidal ideations and presents future oriented .   Objective:  I have discussed case with treatment team and have met with patient . Patient presents with a partially improved mood and range of affect, although continues to report anxiety, which he describes as chronic. As noted, reports Seroquel has been effective medication for anxiety and mood but feels dose needs to be titrated .  Currently denies medication side effects. Of note, patient reports he has been on Keppra for " a while " for history of seizures - states he has had no seizures in more than a year, and states " I don't think I really have seizure disorder, it was more related to what I was using and staying up for days". Denies side effects. Have reviewed side effect profile, including risk of sedation, motor and metabolic side effects/weight gain. Of note, patient states that he has been on Seroquel before without side effects or weight gain.  Focused on disposition planning, reports high level of motivation of going to [facility] long term residential setting if accepted, states  " I know if I go back out I will probably relapse ".  Behavior on unit calm, no disruptive or agitated behaviors . Denies suicidal ideations.  Principal Problem: Substance induced mood disorder ([place]) Diagnosis:   Patient Active Problem List   Diagnosis Date Noted  . Major depressive disorder, recurrent episode, severe ([place]) [F33.2] 01/30/2018  . Substance induced mood disorder ([place]) [F19.94] 01/30/2018  . MDD (major depressive disorder), single episode, severe , no psychosis ([place]) [F32.2] 01/29/2018  . Hemiplegic migraine with status migrainosus [G43.401] 06/06/2015  . Chronic insomnia [F51.04] 06/06/2015  . Cocaine abuse ([place]) [F14.10] 05/22/2015  . Alcohol dependence with uncomplicated withdrawal ([place]) [F10.230]   . Headache, migraine [G43.909]   . Migraines [G43.909] 11/29/2014  . Headache [R51] 08/08/2014  . Seizures ([place]) [R56.9] 01/09/2014  . Chronic low back pain [M54.5, G89.29] 12/28/2013  . Hemiplegia, unspecified, affecting dominant side [G81.90] 12/06/2013  . CVA (cerebral infarction) [I63.9] 12/04/2013  . Leukocytosis [D72.829] 12/04/2013  . Acute Gastroenteritis [K52.9] 12/04/2013  . Hypertension [I10] 12/04/2013  . Hemiparesis ([place]) [G81.90] 12/04/2013  . Delirium tremens ([place]) [F10.231] 07/29/2013  . Alcohol withdrawal ([place]) [F10.239] 07/29/2013  . UGIB (upper gastrointestinal bleed) [K92.2] 07/29/2013  . Chest pain [R07.9] 07/28/2013   Total Time spent with patient: 15 minutes  Past Psychiatric History:   Past Medical History:  Past Medical History:  Diagnosis Date  . Allergy   . Anxiety   . Bipolar disorder ([place])   . Chronic insomnia 06/06/2015  . Depression   .  Epilepsy ([place])   . Hemiplegic migraine with status migrainosus 06/06/2015  . Hypertension   . Migraine   . Stroke [facility]    patient denies  . Substance abuse [facility])     Past Surgical History:  Procedure Laterality Date  . APPENDECTOMY    . KNEE ARTHROSCOPY Right   . NOSE SURGERY     Family  History:  Family History  Problem Relation Age of Onset  . Heart disease Mother   . Stroke Mother   . Epilepsy Mother   . Heart disease Father   . Hypertension Father   . Melanoma Father   . Migraines Father   . Colon cancer Sister   . Colon cancer Cousin    Family Psychiatric  History:  Social History:  Social History   Substance and Sexual Activity  Alcohol Use Yes  . Alcohol/week: 3.6 oz  . Types: 6 Cans of beer per week   Comment: 6 drinks per day prior to one month ago     Social History   Substance and Sexual Activity  Drug Use Yes  . Frequency: 3.0 times per week  . Types: Marijuana, Heroin, Cocaine   Comment: crack, heroin    Social History   Socioeconomic History  . Marital status: Divorced    Spouse name: Not on file  . Number of children: 0  . Years of education: GED  . Highest education level: Not on file  Occupational History  . Occupation: disabled  Social Needs  . Financial resource strain: Not on file  . Food insecurity:    Worry: Not on file    Inability: Not on file  . Transportation needs:    Medical: Not on file    Non-medical: Not on file  Tobacco Use  . Smoking status: Current Every Day Smoker    Packs/day: 1.00    Years: 15.00    Pack years: 15.00    Types: Cigarettes    Last attempt to quit: 03/20/2015    Years since quitting: 2.8  . Smokeless tobacco: Never Used  Substance and Sexual Activity  . Alcohol use: Yes    Alcohol/week: 3.6 oz    Types: 6 Cans of beer per week    Comment: 6 drinks per day prior to one month ago  . Drug use: Yes    Frequency: 3.0 times per week    Types: Marijuana, Heroin, Cocaine    Comment: crack, heroin  . Sexual activity: Yes    Birth control/protection: Condom  Lifestyle  . Physical activity:    Days per week: Not on file    Minutes per session: Not on file  . Stress: Not on file  Relationships  . Social connections:    Talks on phone: Not on file    Gets together: Not on file    Attends  religious service: Not on file    Active member of club or organization: Not on file    Attends meetings of clubs or organizations: Not on file    Relationship status: Not on file  Other Topics Concern  . Not on file  Social History Narrative   Pt lives with significant other.    Patient drinks 2 cups of caffeine daily.   Patient is right handed.   Additional Social History:    Pain Medications: see home medications list Prescriptions: see home medication list Over the Counter: see home medication list History of alcohol / drug use?: Yes Longest period of sobriety (when/how  long): unsure Negative Consequences of Use: Financial, Personal relationships, Work / School Withdrawal Symptoms: Other (Comment)(anxiety, depression) Name of Substance 1: alcohol 1 - Age of First Use: 57 yrs old, first beer 1 - Amount (size/oz): daily drinking alcohol 1 - Frequency: daily 1 - Duration: many years 1 - Last Use / Amount: 01/27/2018  Sleep: improving   Appetite:  improving   Current Medications: Current Facility-Administered Medications  Medication Dose Route Frequency Provider Last Rate Last Dose  . acetaminophen (TYLENOL) tablet 650 mg  650 mg Oral Q6H PRN Rankin, Shuvon B, NP   650 mg at 02/04/18 2992  . alum & mag hydroxide-simeth (MAALOX/MYLANTA) 200-200-20 MG/5ML suspension 30 mL  30 mL Oral Q4H PRN Rankin, Shuvon B, NP   30 mL at 02/03/18 2127  . busPIRone (BUSPAR) tablet 10 mg  10 mg Oral TID Derrill Center, NP   10 mg at 02/04/18 0810  . feeding supplement (ENSURE ENLIVE) (ENSURE ENLIVE) liquid 237 mL  237 mL Oral BID PRN Izediuno, Vincent A, MD      . FLUoxetine (PROZAC) capsule 20 mg  20 mg Oral Daily Derrill Center, NP   20 mg at 02/04/18 0809  . gabapentin (NEURONTIN) capsule 300 mg  300 mg Oral BID Derrill Center, NP   300 mg at 02/04/18 0809  . ibuprofen (ADVIL,MOTRIN) tablet 800 mg  800 mg Oral Q6H PRN Lindon Romp A, NP   800 mg at 01/29/18 2107  . levETIRAcetam (KEPPRA)  tablet 500 mg  500 mg Oral BID Artist Beach, MD   500 mg at 02/04/18 0809  . magnesium hydroxide (MILK OF MAGNESIA) suspension 30 mL  30 mL Oral Daily PRN Rankin, Shuvon B, NP   30 mL at 01/31/18 0758  . multivitamin with minerals tablet 1 tablet  1 tablet Oral Daily Derrill Center, NP   1 tablet at 02/04/18 0809  . nicotine polacrilex (NICORETTE) gum 2 mg  2 mg Oral PRN Izediuno, Laruth Bouchard, MD      . propranolol (INDERAL) tablet 20 mg  20 mg Oral BID Derrill Center, NP   20 mg at 02/04/18 0809  . QUEtiapine (SEROQUEL) tablet 200 mg  200 mg Oral QHS Patrecia Pour, NP   200 mg at 02/03/18 2126  . QUEtiapine (SEROQUEL) tablet 50 mg  50 mg Oral BID Derrill Center, NP   50 mg at 02/04/18 0809  . thiamine (VITAMIN B-1) tablet 100 mg  100 mg Oral Daily Derrill Center, NP   100 mg at 02/04/18 0809  . traZODone (DESYREL) tablet 100 mg  100 mg Oral QHS Nanci Pina, FNP   100 mg at 02/03/18 2126    Lab Results:  No results found for this or any previous visit (from the past 48 hour(s)).  Blood Alcohol level:  Lab Results  Component Value Date   ETH <10 01/28/2018   ETH 139 (H) 42/68/3419    Metabolic Disorder Labs: Lab Results  Component Value Date   HGBA1C 4.9 02/01/2018   MPG 93.93 02/01/2018   MPG 97 12/05/2013   Lab Results  Component Value Date   PROLACTIN 15.2 02/01/2018   Lab Results  Component Value Date   CHOL 199 02/01/2018   TRIG 229 (H) 02/01/2018   HDL 55 02/01/2018   CHOLHDL 3.6 02/01/2018   VLDL 46 (H) 02/01/2018   LDLCALC 98 02/01/2018   LDLCALC 62 12/05/2013    Physical Findings: AIMS: Facial and Oral  Movements Muscles of Facial Expression: None, normal Lips and Perioral Area: None, normal Jaw: None, normal Tongue: None, normal,Extremity Movements Upper (arms, wrists, hands, fingers): None, normal Lower (legs, knees, ankles, toes): None, normal, Trunk Movements Neck, shoulders, hips: None, normal, Overall Severity Severity of abnormal  movements (highest score from questions above): None, normal Incapacitation due to abnormal movements: None, normal Patient's awareness of abnormal movements (rate only patient's report): No Awareness, Dental Status Current problems with teeth and/or dentures?: Yes Does patient usually wear dentures?: No  CIWA:  CIWA-Ar Total: 0 COWS:  COWS Total Score: 0  Musculoskeletal: Strength & Muscle Tone: within normal limits Gait & Station: normal Patient leans: N/A  Psychiatric Specialty Exam: Physical Exam  Constitutional: He is oriented to person, place, and time. He appears well-developed.  Neurological: He is alert and oriented to person, place, and time.  Psychiatric: He has a normal mood and affect. His behavior is normal.    Review of Systems  Psychiatric/Behavioral: Positive for depression and substance abuse. Negative for hallucinations. The patient is nervous/anxious.   All other systems reviewed and are negative. no chest pain, no shortness of breath   Blood pressure 103/76, pulse 79, temperature (!) 97.4 F (36.3 C), temperature source Oral, resp. rate 18, height 6' (1.829 m), weight 80.3 kg (177 lb), SpO2 98 %.Body mass index is 24.01 kg/m.  General Appearance: improving grooming   Eye Contact:  Good  Speech:  Normal Rate  Volume:  Normal  Mood:  improving , less depressed, remains anxious ,but states overall feeling better  Affect:  Congruent and vaguely anxious, smiles at times appropriately during session  Thought Process:  Linear and Descriptions of Associations: Intact  Orientation:  Other:  fully alert and attentive   Thought Content:  no hallucinations, no delusions, worries about disposition planning , namely about whether he has been accepted to long term program.   Suicidal Thoughts:  No currently denies suicidal or self injurious ideations, denies homicidal or violent ideations  Homicidal Thoughts:  No  Memory:  recent and remote grossly intact   Judgement:   Other:  improving   Insight:  improving   Psychomotor Activity:  Normal  Concentration:  Concentration: Good and Attention Span: Good  Recall:  Good  Fund of Knowledge:  Good  Language:  Good  Akathisia:  No  Handed:  Right  AIMS (if indicated):     Assets:  Communication Skills Desire for Improvement Financial Resources/Insurance Physical Health  ADL's:  Intact  Cognition:  WNL  Sleep:  Number of Hours: 6.25    Assessment - patient presents with improving mood and range of affect . Denies suicidal ideations. Remains anxious, ruminative, but overall future oriented, and focused on disposition planning . Had interview with [facility] long term residential program in [place] yesterday and today ruminates about whether he was accepted or not. Tolerating medications well.  Treatment Plan Summary: Daily contact with patient to assess and evaluate symptoms and progress in treatment and Medication management  Treatment plan reviewed as below today 3/21  Continue Fluoxetine 20 mg QDAY for depression  Continue Gabapentin 300 mg BID for pain, anxiety Increase Seroquel to 50 mgrs PO BID and  300 mg QHS for mood disorder, anxiety , insomnia  Continue  Trazodone 158m QHS PRN for insomnia  For now continue Keppra 500 mgrs BID for history of seizures. Patient encouraged to follow up with outpatient Neurologist in order to discuss further management .  Treatment team working on disposition planning  as above, currently  pending reply from [facility] regarding if patient was admitted .   Jenne Campus, MD 02/04/2018, 11:44 AM   Patient ID: Brookmont Callas, male   DOB: Apr 06, 1961, 57 y.o.   MRN: [id number]

## 2018-02-04 NOTE — Progress Notes (Signed)
Pt attended morning orientation/goals group and stated that today he is preparing to speak with his doctor about medications to help with his sleep an seizures.

## 2018-02-04 NOTE — Progress Notes (Signed)
Adult Psychoeducational Group Note  Date:  02/04/2018 Time:  9:22 PM  Group Topic/Focus:  Wrap-Up Group:   The focus of this group is to help patients review their daily goal of treatment and discuss progress on daily workbooks.  Participation Level:  Active  Participation Quality:  Appropriate  Affect:  Appropriate  Cognitive:  Alert and Appropriate  Insight: Appropriate  Engagement in Group:  Engaged  Modes of Intervention:  Discussion  Additional Comments:  Patient attended group and participated.   Everardo Voris W Alethea Terhaar 02/08/4981, 9:22 PM

## 2018-02-04 NOTE — Progress Notes (Signed)
DAR NOTE: Patient presents with anxious affect and mood.  Denies suicidal thoughts, auditory and visual hallucinations.  Described energy level as hyper and concentration as good.  Rates depression at 5, hopelessness at 5, and anxiety at 5.  Maintained on routine safety checks.  Medications given as prescribed.  Support and encouragement offered as needed.  Attended group and participated.  States goal for today is "long term treatment."  Patient visible in milieu interacting with peers.  Patient is safe on the unit.

## 2018-02-04 NOTE — [facility] Group Notes (Signed)
LCSW Group Therapy Note  02/04/2018 1:15pm  Type of Therapy/Topic:  Group Therapy:  Feelings about Diagnosis  Participation Level:  Active   Description of Group:   This group will allow patients to explore their thoughts and feelings about diagnoses they have received. Patients will be guided to explore their level of understanding and acceptance of these diagnoses. Facilitator will encourage patients to process their thoughts and feelings about the reactions of others to their diagnosis and will guide patients in identifying ways to discuss their diagnosis with significant others in their lives. This group will be process-oriented, with patients participating in exploration of their own experiences, giving and receiving support, and processing challenge from other group members.   Therapeutic Goals: 1. Patient will demonstrate understanding of diagnosis as evidenced by identifying two or more symptoms of the disorder 2. Patient will be able to express two feelings regarding the diagnosis 3. Patient will demonstrate their ability to communicate their needs through discussion and/or role play  Summary of Patient Progress:  Jeanluc was attentive and engaged during today's processing group. He shared that he plans to attend [facility] in [place], [place] on Saturday. "I'm so excited and thankful for you all." Cloys continues to show progress in the group setting with improving insight.   Therapeutic Modalities:   Cognitive Behavioral Therapy Brief Therapy Feelings Identification    Kimber Relic Smart, LCSW 02/04/2018 1:03 PM

## 2018-02-04 NOTE — Progress Notes (Signed)
The patient has been accepted to [facility] ([phone]) in [place], [place] for residential treatment.   The patient is scheduled to [place] AT 9:30am in order to [place] bus depot by 11:00am (recommended to be an hour early for bus)  to catch the [employer] that is scheduled to leave for [place] at 12:10pm.  CSW and the patient spoke to his sister, [name] ([phone]) to share that the patient had been accepted by the program.   Per Katharine Look, she will pick the patient up Saturday morning between 9:45am-10:00am, to ensure the patient gets to the bus depot by 11:00am for a safe ride to [place], [place].   CSW will continue to follow.     Radonna Ricker, MSW, [place] Worker [facility]  Phone: [phone]

## 2018-02-04 NOTE — Progress Notes (Signed)
DAR Note: Patient presents with sad affect and depressed mood. Patient stated that he did not have a good sleep and was messed up for the whole day. Patient hoping to get a good sleep tonight. Patient denies pain, SI/HI, AH/VH at this time. Cooperative on unit. No behavior issues noted.  Staff offered support and encouragement as needed. Routine safety check maintained. Will continue to monitor patient.  Patient remains safe on unit.

## 2018-02-05 MED ORDER — HYDROCORTISONE 1 % EX CREA
TOPICAL_CREAM | Freq: Two times a day (BID) | CUTANEOUS | Status: DC
  Administered 2018-02-05 – 2018-02-06 (×2): via TOPICAL
  Filled 2018-02-05 (×2): qty 28

## 2018-02-05 MED ORDER — LORAZEPAM 0.5 MG PO TABS
0.5000 mg | ORAL_TABLET | Freq: Once | ORAL | Status: AC
  Administered 2018-02-05: 0.5 mg via ORAL
  Filled 2018-02-05: qty 1

## 2018-02-05 NOTE — Progress Notes (Signed)
Adult Psychoeducational Group Note  Date:  02/05/2018 Time:  9:53 PM  Group Topic/Focus:  Wrap-Up Group:   The focus of this group is to help patients review their daily goal of treatment and discuss progress on daily workbooks.  Participation Level:  Active  Participation Quality:  Appropriate  Affect:  Appropriate  Cognitive:  Appropriate  Insight: Appropriate  Engagement in Group:  Engaged  Modes of Intervention:  Discussion  Additional Comments:  Patient attended [place] group meeting.  Jaquann Guarisco W Avila Albritton 5/88/3254, 9:53 PM

## 2018-02-05 NOTE — [facility] Group Notes (Signed)
[facility] Group Notes:  (Nursing/MHT/Case Management/Adjunct)  Date:  02/05/2018  Time:  4:00 pm  Type of Therapy:  Nurse Education  Participation Level:  Active  Participation Quality:  Appropriate  Affect:  Appropriate  Cognitive:  Appropriate  Insight:  Appropriate  Engagement in Group:  Engaged  Modes of Intervention:  Education  Summary of Progress/Problems:  Patient attended and interacted appropriate with group members.   Cammy Copa 02/05/2018, 6:53 PM

## 2018-02-05 NOTE — Tx Team (Signed)
Interdisciplinary Treatment and Diagnostic Plan Update  02/05/2018 Time of Session: 0830AM Spencer Mendoza MRN: [id number]  Principal Diagnosis: Substance induced mood disorder ([place])  Secondary Diagnoses: Principal Problem:   Substance induced mood disorder ([place]) Active Problems:   Alcohol dependence with uncomplicated withdrawal ([place])   Cocaine abuse ([place])   MDD (major depressive disorder), single episode, severe , no psychosis ([employer])   Major depressive disorder, recurrent episode, severe ([place])   Current Medications:  Current Facility-Administered Medications  Medication Dose Route Frequency Provider Last Rate Last Dose  . acetaminophen (TYLENOL) tablet 650 mg  650 mg Oral Q6H PRN Rankin, Shuvon B, NP   650 mg at 02/04/18 1434  . alum & mag hydroxide-simeth (MAALOX/MYLANTA) 200-200-20 MG/5ML suspension 30 mL  30 mL Oral Q4H PRN Rankin, Shuvon B, NP   30 mL at 02/03/18 2127  . busPIRone (BUSPAR) tablet 10 mg  10 mg Oral TID Derrill Center, NP   10 mg at 02/05/18 0752  . feeding supplement (ENSURE ENLIVE) (ENSURE ENLIVE) liquid 237 mL  237 mL Oral BID PRN Izediuno, Vincent A, MD      . FLUoxetine (PROZAC) capsule 20 mg  20 mg Oral Daily Derrill Center, NP   20 mg at 02/05/18 0752  . gabapentin (NEURONTIN) capsule 300 mg  300 mg Oral BID Derrill Center, NP   300 mg at 02/05/18 0752  . ibuprofen (ADVIL,MOTRIN) tablet 800 mg  800 mg Oral Q6H PRN Lindon Romp A, NP   800 mg at 02/04/18 1946  . levETIRAcetam (KEPPRA) tablet 500 mg  500 mg Oral BID Artist [place], MD   500 mg at 02/05/18 0752  . magnesium hydroxide (MILK OF MAGNESIA) suspension 30 mL  30 mL Oral Daily PRN Rankin, Shuvon B, NP   30 mL at 01/31/18 0758  . multivitamin with minerals tablet 1 tablet  1 tablet Oral Daily Derrill Center, NP   1 tablet at 02/05/18 0752  . nicotine polacrilex (NICORETTE) gum 2 mg  2 mg Oral PRN Izediuno, Laruth Bouchard, MD      . propranolol (INDERAL) tablet 20 mg  20 mg Oral BID Derrill Center,  NP   20 mg at 02/05/18 0751  . QUEtiapine (SEROQUEL) tablet 300 mg  300 mg Oral QHS Cobos, Myer Peer, MD   300 mg at 02/04/18 2131  . QUEtiapine (SEROQUEL) tablet 50 mg  50 mg Oral BID Derrill Center, NP   50 mg at 02/05/18 0752  . thiamine (VITAMIN B-1) tablet 100 mg  100 mg Oral Daily Derrill Center, NP   100 mg at 02/05/18 0751  . traZODone (DESYREL) tablet 100 mg  100 mg Oral QHS Starkes, Takia S, FNP   100 mg at 02/04/18 2131   PTA Medications: Medications Prior to Admission  Medication Sig Dispense Refill Last Dose  . aluminum-magnesium hydroxide-simethicone (MAALOX) 093-235-57 MG/5ML SUSP Take 30 mLs by mouth 3 (three) times daily as needed (INDIGESTION).   unknown at prn  . amitriptyline (ELAVIL) 25 MG tablet Take 3 tablets (75 mg total) by mouth at bedtime. For sleep (Patient taking differently: Take 150 mg by mouth at bedtime. For sleep) 90 tablet 0 01/27/2018 at Unknown time  . Aspirin-Salicylamide-Caffeine (BC HEADACHE POWDER PO) Take 1 Package by mouth as needed.   01/27/2018 at Unknown time  . busPIRone (BUSPAR) 10 MG tablet Take 10 mg by mouth 3 (three) times daily.   01/27/2018 at Unknown time  . FLUoxetine (PROZAC) 40  MG capsule Take 40 mg by mouth daily.   01/27/2018 at Unknown time  . ibuprofen (ADVIL,MOTRIN) 800 MG tablet Take 1 tablet (800 mg total) by mouth 3 (three) times daily. (Patient taking differently: Take 800 mg by mouth every 6 (six) hours as needed. ) 21 tablet 0 01/25/2018 at prn  . levETIRAcetam (KEPPRA) 500 MG tablet Take 500 mg by mouth 2 (two) times daily.   01/27/2018 at Unknown time  . Magnesium Hydroxide (MILK OF MAGNESIA PO) Take 30 mLs by mouth 3 (three) times daily as needed (CONSTIPATION).   Past Week at prn  . propranolol (INDERAL) 20 MG tablet Take 20 mg by mouth 3 (three) times daily.   01/27/2018 at Unknown time  . propranolol (INDERAL) 40 MG tablet Take 1 tablet (40 mg total) by mouth 2 (two) times daily. (Patient not taking: Reported on 01/22/2018) 60  tablet 0 Not Taking at Unknown time  . QUEtiapine (SEROQUEL) 100 MG tablet Take 100 mg by mouth See admin instructions. Take 100 mg in the morning at 12:00 and at 1600. 300 mg at 20:00   01/27/2018 at Unknown time  . SUMAtriptan (IMITREX) 50 MG tablet Take 50 mg by mouth daily as needed (MIGRAINES).    01/25/2018 at prn  . thiamine (VITAMIN B-1) 100 MG tablet Take 100 mg by mouth daily.    01/27/2018 at Unknown time    Patient Stressors: Financial difficulties Health problems Marital or family conflict Medication change or noncompliance Substance abuse  Patient Strengths: Capable of independent living Curator fund of knowledge Motivation for treatment/growth Physical Health  Treatment Modalities: Medication Management, Group therapy, Case management,  1 to 1 session with clinician, Psychoeducation, Recreational therapy.   Physician Treatment Plan for Primary Diagnosis: Substance induced mood disorder ([place]) Long Term Goal(s): Improvement in symptoms so as ready for discharge Improvement in symptoms so as ready for discharge   Short Term Goals: Ability to identify changes in lifestyle to reduce recurrence of condition will improve Ability to verbalize feelings will improve Ability to disclose and discuss suicidal ideas Ability to demonstrate self-control will improve Ability to identify and develop effective coping behaviors will improve Ability to maintain clinical measurements within normal limits will improve Compliance with prescribed medications will improve Ability to identify triggers associated with substance abuse/mental health issues will improve Ability to identify changes in lifestyle to reduce recurrence of condition will improve Ability to verbalize feelings will improve Ability to disclose and discuss suicidal ideas Ability to demonstrate self-control will improve Ability to identify and develop effective coping behaviors will improve Ability to  maintain clinical measurements within normal limits will improve Compliance with prescribed medications will improve Ability to identify triggers associated with substance abuse/mental health issues will improve  Medication Management: Evaluate patient's response, side effects, and tolerance of medication regimen.  Therapeutic Interventions: 1 to 1 sessions, Unit Group sessions and Medication administration.  Evaluation of Outcomes: Progressing  Physician Treatment Plan for Secondary Diagnosis: Principal Problem:   Substance induced mood disorder ([place]) Active Problems:   Alcohol dependence with uncomplicated withdrawal ([facility])   Cocaine abuse ([place])   MDD (major depressive disorder), single episode, severe , no psychosis ([place])   Major depressive disorder, recurrent episode, severe ([place])  Long Term Goal(s): Improvement in symptoms so as ready for discharge Improvement in symptoms so as ready for discharge   Short Term Goals: Ability to identify changes in lifestyle to reduce recurrence of condition will improve Ability to verbalize feelings will improve Ability  to disclose and discuss suicidal ideas Ability to demonstrate self-control will improve Ability to identify and develop effective coping behaviors will improve Ability to maintain clinical measurements within normal limits will improve Compliance with prescribed medications will improve Ability to identify triggers associated with substance abuse/mental health issues will improve Ability to identify changes in lifestyle to reduce recurrence of condition will improve Ability to verbalize feelings will improve Ability to disclose and discuss suicidal ideas Ability to demonstrate self-control will improve Ability to identify and develop effective coping behaviors will improve Ability to maintain clinical measurements within normal limits will improve Compliance with prescribed medications will improve Ability to identify triggers  associated with substance abuse/mental health issues will improve     Medication Management: Evaluate patient's response, side effects, and tolerance of medication regimen.  Therapeutic Interventions: 1 to 1 sessions, Unit Group sessions and Medication administration.  Evaluation of Outcomes: Progressing   RN Treatment Plan for Primary Diagnosis: Substance induced mood disorder ([place]) Long Term Goal(s): Knowledge of disease and therapeutic regimen to maintain health will improve  Short Term Goals: Ability to remain free from injury will improve, Ability to disclose and discuss suicidal ideas and Ability to identify and develop effective coping behaviors will improve  Medication Management: RN will administer medications as ordered by provider, will assess and evaluate patient's response and provide education to patient for prescribed medication. RN will report any adverse and/or side effects to prescribing provider.  Therapeutic Interventions: 1 on 1 counseling sessions, Psychoeducation, Medication administration, Evaluate responses to treatment, Monitor vital signs and CBGs as ordered, Perform/monitor CIWA, COWS, AIMS and Fall Risk screenings as ordered, Perform wound care treatments as ordered.  Evaluation of Outcomes: Progressing   LCSW Treatment Plan for Primary Diagnosis: Substance induced mood disorder ([place]) Long Term Goal(s): Safe transition to appropriate next level of care at discharge, Engage patient in therapeutic group addressing interpersonal concerns.  Short Term Goals: Engage patient in aftercare planning with referrals and resources, Facilitate patient progression through stages of change regarding substance use diagnoses and concerns and Identify triggers associated with mental health/substance abuse issues  Therapeutic Interventions: Assess for all discharge needs, 1 to 1 time with Social worker, Explore available resources and support systems, Assess for adequacy in  community support network, Educate family and significant other(s) on suicide prevention, Complete Psychosocial Assessment, Interpersonal group therapy.  Evaluation of Outcomes: Progressing   Progress in Treatment: Attending groups: Yes. Participating in groups: Yes. Taking medication as prescribed: Yes. Toleration medication: Yes. Family/Significant other contact made: Pt declined to consent to collateral contact. SPE completed with pt.  Patient understands diagnosis: Yes. Discussing patient identified problems/goals with staff: Yes. Medical problems stabilized or resolved: Yes. Denies suicidal/homicidal ideation: Yes. Issues/concerns per patient self-inventory: No. Other: n/a   New problem(s) identified: No, Describe:  n/a  New Short Term/Long Term Goal(s): detox, medication management for mood stabilization; elimination of SI thoughts; development of comprehensive mental wellness/sobriety plan.   Discharge Plan or Barriers: Pt has been [facility] information provided to pt. Family member will pick him up at 10am on Saturday, 3/23 and [place] [employer] [place]. [place] pamphlet and AA/NA lists also provided to pt for additional community support.   Reason for Continuation of Hospitalization:  Depression Medication management   Estimated Length of Stay: Saturday, 02/06/18-must discharge by 10am.   Attendees: Patient:  02/05/2018 9:15 AM  Physician: Dr. Parke Poisson MD; Dr. [name] MD 02/05/2018 9:15 AM  Nursing: Santiago Glad RN; Rise Paganini  RN 02/05/2018 9:15 AM  RN Care Manager:x 02/05/2018 9:15 AM  Social Worker: Press photographer, LCSW; Radonna Ricker LCSW-A 02/05/2018 9:15 AM  Recreational Therapist: x 02/05/2018 9:15 AM  Other: Lindell Spar NP 02/05/2018 9:15 AM  Other:  02/05/2018 9:15 AM  Other: 02/05/2018 9:15 AM    Scribe for Treatment Team: Kimber Relic Smart, LCSW 02/05/2018 9:15 AM

## 2018-02-05 NOTE — Plan of Care (Signed)
D: Spencer Mendoza has talked about feeling nervous, tense, and anxious today in preparation for discharge tomorrow, when he travels to [place] for further treatment.  He reports that his legs feel tense and his hands feel "tingly" in the a.m. He says this is the longest he's "ever" been sober. He denied SI, HI, and AVH. He's been visible in the milieu. Her reported fair sleep, good appetite, hyper energy lelve, and good concentration. He rated his depression and hopelessness 2/10.   A: Meds given as ordered, including PRN 0.5 mg Ativan,, which he said "made a world of difference." Q15 safety checks maintained. Support/encouragement offered.  R: Pt remains free from harm and continues with treatment. Will continue to monitor for needs/safety.   Problem: Coping: Goal: Ability to verbalize frustrations and anger appropriately will improve Outcome: Progressing Goal: Ability to demonstrate self-control will improve Outcome: Progressing   Problem: Safety: Goal: Periods of time without injury will increase Outcome: Progressing   Problem: Medication: Goal: Compliance with prescribed medication regimen will improve Outcome: Progressing   Problem: Self-Concept: Goal: Ability to disclose and discuss suicidal ideas will improve Outcome: Progressing

## 2018-02-05 NOTE — Progress Notes (Signed)
[name] MD Progress Note  02/05/2018 5:32 PM Spencer Mendoza  MRN:  [id number]   Subjective: patient reports feeling better. He states he has chronic anxiety, and states he feels somewhat more anxious now, as he prepares for planned discharge tomorrow, but states " it is a good anxiety, because I am really motivated to go" . He is scheduled to go to [facility] , which is a long term residential setting in [place]. States " my sister is going to pick me up tomorrow morning, and is going to take me to the bus station. The trip is about 24 hours ". Denies medication side effects. Denies suicidal ideations.   Objective:  I have discussed case with treatment team and have met with patient . Patient presents improved today- although reports ongoing anxiety, presents with improved mood and a more reactive affect.  As above, currently focused on disposition plan of going to residential program in [place]. Denies medication side effects. Of note, reports some pruritus localized to dorsum of R hand . He does have localized pruriginous erythematous rash, which appears consistent with localized , contact dermatitis. L hand does not appear affected and does not endorse any other areas of skin being affected, he believes it could be soap, sanitizer or similar . He has no systemic symptoms. Behavior on unit in good control, pleasant on approach.  Denies suicidal ideations.  Principal Problem: Substance induced mood disorder ([place]) Diagnosis:   Patient Active Problem List   Diagnosis Date Noted  . Major depressive disorder, recurrent episode, severe ([place]) [F33.2] 01/30/2018  . Substance induced mood disorder ([place]) [F19.94] 01/30/2018  . MDD (major depressive disorder), single episode, severe , no psychosis ([place]) [F32.2] 01/29/2018  . Hemiplegic migraine with status migrainosus [G43.401] 06/06/2015  . Chronic insomnia [F51.04] 06/06/2015  . Cocaine abuse ([place]) [F14.10] 05/22/2015  . Alcohol dependence with  uncomplicated withdrawal ([place]) [F10.230]   . Headache, migraine [G43.909]   . Migraines [G43.909] 11/29/2014  . Headache [R51] 08/08/2014  . Seizures ([place]) [R56.9] 01/09/2014  . Chronic low back pain [M54.5, G89.29] 12/28/2013  . Hemiplegia, unspecified, affecting dominant side [G81.90] 12/06/2013  . CVA (cerebral infarction) [I63.9] 12/04/2013  . Leukocytosis [D72.829] 12/04/2013  . Acute Gastroenteritis [K52.9] 12/04/2013  . Hypertension [I10] 12/04/2013  . Hemiparesis ([place]) [G81.90] 12/04/2013  . Delirium tremens ([place]) [F10.231] 07/29/2013  . Alcohol withdrawal ([place]) [F10.239] 07/29/2013  . UGIB (upper gastrointestinal bleed) [K92.2] 07/29/2013  . Chest pain [R07.9] 07/28/2013   Total Time spent with patient: 15 minutes  Past Psychiatric History:   Past Medical History:  Past Medical History:  Diagnosis Date  . Allergy   . Anxiety   . Bipolar disorder ([place])   . Chronic insomnia 06/06/2015  . Depression   . Epilepsy ([place])   . Hemiplegic migraine with status migrainosus 06/06/2015  . Hypertension   . Migraine   . Stroke [facility])    patient denies  . Substance abuse [facility])     Past Surgical History:  Procedure Laterality Date  . APPENDECTOMY    . KNEE ARTHROSCOPY Right   . NOSE SURGERY     Family History:  Family History  Problem Relation Age of Onset  . Heart disease Mother   . Stroke Mother   . Epilepsy Mother   . Heart disease Father   . Hypertension Father   . Melanoma Father   . Migraines Father   . Colon cancer Sister   . Colon cancer Cousin    Family Psychiatric  History:  Social History:  Social History   Substance and Sexual Activity  Alcohol Use Yes  . Alcohol/week: 3.6 oz  . Types: 6 Cans of beer per week   Comment: 6 drinks per day prior to one month ago     Social History   Substance and Sexual Activity  Drug Use Yes  . Frequency: 3.0 times per week  . Types: Marijuana, Heroin, Cocaine   Comment: crack, heroin    Social History    Socioeconomic History  . Marital status: Divorced    Spouse name: Not on file  . Number of children: 0  . Years of education: GED  . Highest education level: Not on file  Occupational History  . Occupation: disabled  Social Needs  . Financial resource strain: Not on file  . Food insecurity:    Worry: Not on file    Inability: Not on file  . Transportation needs:    Medical: Not on file    Non-medical: Not on file  Tobacco Use  . Smoking status: Current Every Day Smoker    Packs/day: 1.00    Years: 15.00    Pack years: 15.00    Types: Cigarettes    Last attempt to quit: 03/20/2015    Years since quitting: 2.8  . Smokeless tobacco: Never Used  Substance and Sexual Activity  . Alcohol use: Yes    Alcohol/week: 3.6 oz    Types: 6 Cans of beer per week    Comment: 6 drinks per day prior to one month ago  . Drug use: Yes    Frequency: 3.0 times per week    Types: Marijuana, Heroin, Cocaine    Comment: crack, heroin  . Sexual activity: Yes    Birth control/protection: Condom  Lifestyle  . Physical activity:    Days per week: Not on file    Minutes per session: Not on file  . Stress: Not on file  Relationships  . Social connections:    Talks on phone: Not on file    Gets together: Not on file    Attends religious service: Not on file    Active member of club or organization: Not on file    Attends meetings of clubs or organizations: Not on file    Relationship status: Not on file  Other Topics Concern  . Not on file  Social History Narrative   Pt lives with significant other.    Patient drinks 2 cups of caffeine daily.   Patient is right handed.   Additional Social History:    Pain Medications: see home medications list Prescriptions: see home medication list Over the Counter: see home medication list History of alcohol / drug use?: Yes Longest period of sobriety (when/how long): unsure Negative Consequences of Use: Financial, Personal relationships, Work /  You[employer] worker Withdrawal Symptoms: Other (Comment)(anxiety, depression) Name of Substance 1: alcohol 1 - Age of First Use: 57 yrs old, first beer 1 - Amount (size/oz): daily drinking alcohol 1 - Frequency: daily 1 - Duration: many years 1 - Last Use / Amount: 01/27/2018  Sleep: improving   Appetite:  improving   Current Medications: Current Facility-Administered Medications  Medication Dose Route Frequency Provider Last Rate Last Dose  . acetaminophen (TYLENOL) tablet 650 mg  650 mg Oral Q6H PRN Rankin, Shuvon B, NP   650 mg at 02/04/18 1434  . alum & mag hydroxide-simeth (MAALOX/MYLANTA) 200-200-20 MG/5ML suspension 30 mL  30 mL Oral Q4H PRN Rankin, Shuvon B, NP  30 mL at 02/03/18 2127  . busPIRone (BUSPAR) tablet 10 mg  10 mg Oral TID Derrill Center, NP   10 mg at 02/05/18 1718  . feeding supplement (ENSURE ENLIVE) (ENSURE ENLIVE) liquid 237 mL  237 mL Oral BID PRN Izediuno, Vincent A, MD      . FLUoxetine (PROZAC) capsule 20 mg  20 mg Oral Daily Derrill Center, NP   20 mg at 02/05/18 0752  . gabapentin (NEURONTIN) capsule 300 mg  300 mg Oral BID Derrill Center, NP   300 mg at 02/05/18 1718  . ibuprofen (ADVIL,MOTRIN) tablet 800 mg  800 mg Oral Q6H PRN Lindon Romp A, NP   800 mg at 02/04/18 1946  . levETIRAcetam (KEPPRA) tablet 500 mg  500 mg Oral BID Artist Beach, MD   500 mg at 02/05/18 1718  . magnesium hydroxide (MILK OF MAGNESIA) suspension 30 mL  30 mL Oral Daily PRN Rankin, Shuvon B, NP   30 mL at 01/31/18 0758  . multivitamin with minerals tablet 1 tablet  1 tablet Oral Daily Derrill Center, NP   1 tablet at 02/05/18 0752  . nicotine polacrilex (NICORETTE) gum 2 mg  2 mg Oral PRN Izediuno, Laruth Bouchard, MD      . propranolol (INDERAL) tablet 20 mg  20 mg Oral BID Derrill Center, NP   20 mg at 02/05/18 1719  . QUEtiapine (SEROQUEL) tablet 300 mg  300 mg Oral QHS Michalla Ringer, Myer Peer, MD   300 mg at 02/04/18 2131  . QUEtiapine (SEROQUEL) tablet 50 mg  50 mg Oral BID Derrill Center, NP   50 mg at 02/05/18 1719  . thiamine (VITAMIN B-1) tablet 100 mg  100 mg Oral Daily Derrill Center, NP   100 mg at 02/05/18 0751  . traZODone (DESYREL) tablet 100 mg  100 mg Oral QHS Nanci Pina, FNP   100 mg at 02/04/18 2131    Lab Results:  No results found for this or any previous visit (from the past 48 hour(s)).  Blood Alcohol level:  Lab Results  Component Value Date   ETH <10 01/28/2018   ETH 139 (H) 86/76/7209    Metabolic Disorder Labs: Lab Results  Component Value Date   HGBA1C 4.9 02/01/2018   MPG 93.93 02/01/2018   MPG 97 12/05/2013   Lab Results  Component Value Date   PROLACTIN 15.2 02/01/2018   Lab Results  Component Value Date   CHOL 199 02/01/2018   TRIG 229 (H) 02/01/2018   HDL 55 02/01/2018   CHOLHDL 3.6 02/01/2018   VLDL 46 (H) 02/01/2018   LDLCALC 98 02/01/2018   LDLCALC 62 12/05/2013    Physical Findings: AIMS: Facial and Oral Movements Muscles of Facial Expression: None, normal Lips and Perioral Area: None, normal Jaw: None, normal Tongue: None, normal,Extremity Movements Upper (arms, wrists, hands, fingers): None, normal Lower (legs, knees, ankles, toes): None, normal, Trunk Movements Neck, shoulders, hips: None, normal, Overall Severity Severity of abnormal movements (highest score from questions above): None, normal Incapacitation due to abnormal movements: None, normal Patient's awareness of abnormal movements (rate only patient's report): No Awareness, Dental Status Current problems with teeth and/or dentures?: Yes Does patient usually wear dentures?: No  CIWA:  CIWA-Ar Total: 3 COWS:  COWS Total Score: 0  Musculoskeletal: Strength & Muscle Tone: within normal limits Gait & Station: normal Patient leans: N/A  Psychiatric Specialty Exam: Physical Exam  Constitutional: He is oriented to person, place,  and time. He appears well-developed.  Neurological: He is alert and oriented to person, place, and time.   Psychiatric: He has a normal mood and affect. His behavior is normal.    Review of Systems  Psychiatric/Behavioral: Positive for depression and substance abuse. Negative for hallucinations. The patient is nervous/anxious.   All other systems reviewed and are negative. no chest pain, no shortness of breath , no vomiting   Blood pressure 110/84, pulse 65, temperature 97.8 F (36.6 C), temperature source Oral, resp. rate 18, height 6' (1.829 m), weight 80.3 kg (177 lb), SpO2 98 %.Body mass index is 24.01 kg/m.  General Appearance: improved grooming   Eye Contact:  Good  Speech:  Normal Rate  Volume:  Normal  Mood:  improving mood  Affect:  more reactive, brighter, more reactive. Feels subjectively anxious but presents improved   Thought Process:  Linear and Descriptions of Associations: Intact  Orientation:  Other:  fully alert and attentive   Thought Content:  no hallucinations, no delusions   Suicidal Thoughts:  No currently denies suicidal or self injurious ideations, denies homicidal or violent ideations  Homicidal Thoughts:  No  Memory:  recent and remote grossly intact   Judgement:  Other:  improving   Insight:  improving   Psychomotor Activity:  Normal  Concentration:  Concentration: Good and Attention Span: Good  Recall:  Good  Fund of Knowledge:  Good  Language:  Good  Akathisia:  No  Handed:  Right  AIMS (if indicated):     Assets:  Communication Skills Desire for Improvement Financial Resources/Insurance Physical Health  ADL's:  Intact  Cognition:  WNL  Sleep:  Number of Hours: 4.75    Assessment - presents with improved mood and affect, reports ongoing anxiety but presents less anxious today. He is looking forward to planned discharge tomorrow, plans to go to [facility] , a long term residential setting in [place], and is very motivated in this plan. Denies medication side effects. Feels medications are helping . Contact dermatitis R hand noted, patient suspects may be  related to environmental factor such as soap, sanitizer or similar.   Treatment Plan Summary: Daily contact with patient to assess and evaluate symptoms and progress in treatment and Medication management  Treatment plan reviewed as below today 3/22 Continue Fluoxetine 20 mg QDAY for depression  Continue Gabapentin 300 mg BID for pain, anxiety Continue Seroquel to 50 mgrs PO BID and  300 mg QHS for mood disorder, anxiety , insomnia  Continue  Trazodone 162m QHS PRN for insomnia  Continue Keppra 500 mgrs BID for history of seizures. Patient encouraged to follow up with outpatient Neurologist in order to discuss further management .  Start Hydrocortisone cream locally to affected hand for management of dermatitis .  Treatment team working on disposition planning- discharge planned for tomorrow AM , see above   FJenne Campus MD 02/05/2018, 5:32 PM   Patient ID: GRaleigh Callas male   DOB: [birth year] 57y.o.   MRN: [id number]

## 2018-02-05 NOTE — Progress Notes (Signed)
Patient ID: Spencer Mendoza, male   DOB: [birth year], 59 y.o.   MRN: [id number]  D: Patient in room on approach. Pt reports he is doing well. Pt is excited about getting [place]. Pt reports "I'm almost 40 years this is a life and death for me". Pt reports he is looking forward to getting his life back on track. Pt denies SI/HI/AVH. Pt attended and participated in evening wrap up group. Cooperative with assessment. No acute distressed noted at this time.  A: Medications administered as prescribed. Support and encouragement provided as needed.  R: Patient remains safe and complaint with medications.

## 2018-02-05 NOTE — Progress Notes (Signed)
Patient has been up and active on the unit, attended group this evening and has voiced no complaints. Patient currently denies having pain, -si/hi/a/v hall. Support and encouragement offered, safety maintained on unit, will continue to monitor.  

## 2018-02-05 NOTE — BHH Group Notes (Signed)
LCSW Group Therapy Note 02/05/2018 3:22 PM  Type of Therapy and Topic: Group Therapy: Feelings around Relapse and Recovery  Participation Level: Active   Description of Group:  Patients in this group will discuss emotions they experience before and after a relapse. They will process how experiencing these feelings, or avoidance of experiencing them, relates to having a relapse. Facilitator will guide patients to explore emotions they have related to recovery. Patients will be encouraged to process which emotions are more powerful. They will be guided to discuss the emotional reaction significant others in their lives may have to their relapse or recovery. Patients will be assisted in exploring ways to respond to the emotions of others without this contributing to a relapse.  Therapeutic Goals: 1. Patient will identify two or more emotions that lead to a relapse for them 2. Patient will identify two emotions that result when they relapse 3. Patient will identify two emotions related to recovery 4. Patient will demonstrate ability to communicate their needs through discussion and/or role plays  Summary of Patient Progress:  Spencer Mendoza was engaged and participated throughout the group session. He contributed and stated that relapse was "going back to drinking and drugging". Spencer Mendoza reports that he began using at the age of 57 years old and that at his current age it is "no longer fun". Spencer Mendoza reports that he has struggled with sobriety for years and feels that this is his last chance to save his life. Spencer Mendoza reports that once he arrives to the [facility] program, he hopes to learn coping skills and resistant mechanisms to remain clean the rest of his life.   Therapeutic Modalities:  Cognitive Behavioral Therapy Solution-Focused Therapy Assertiveness Training Relapse Prevention Therapy   Theresa Duty Clinical Social Worker

## 2018-02-05 NOTE — Progress Notes (Signed)
Recreation Therapy Notes  Date: 3.22.19 Time: 9:30 a.m. Location: 300 Hall Dayroom   Group Topic: Stress Management   Goal Area(s) Addresses:  Goal 1.1: To reduce stress  -Patient will report feeling a reduction in stress level  -Patient will identify the importance of stress management  -Patient will participate during stress management group treatment     Behavioral Response: Engaged   Intervention: Stress Management   Activity: Meditattion- Patients were in a peaceful environment with soft lighting enhancing patients mood. Patients listened to mindfulness voice over to decrease stress levels   Education: Stress Management, Discharge Planning.    Education Outcome: Acknowledges edcuation/In group clarification offered/Needs additional education   Clinical Observations/Feedback:: Patient attended and participated appropriately during stress management group treatment. Patient reported feeling a reduction in stress level.    Ranell Patrick, Recreation Therapy Intern   Ranell Patrick 02/05/2018 11:38 AM

## 2018-02-06 ENCOUNTER — Encounter (HOSPITAL_COMMUNITY): Payer: Self-pay | Admitting: Registered Nurse

## 2018-02-06 MED ORDER — QUETIAPINE FUMARATE 300 MG PO TABS: 300.0000 mg | ORAL_TABLET | Freq: Every day | ORAL | 0 refills | Status: DC

## 2018-02-06 MED ORDER — FLUOXETINE HCL 20 MG PO CAPS: 20.0000 mg | ORAL_CAPSULE | Freq: Every day | ORAL | 0 refills | Status: DC

## 2018-02-06 MED ORDER — PROPRANOLOL HCL 20 MG PO TABS: 20.0000 mg | ORAL_TABLET | Freq: Two times a day (BID) | ORAL | 0 refills | Status: DC

## 2018-02-06 MED ORDER — HYDROCORTISONE 1 % EX CREA: TOPICAL_CREAM | Freq: Two times a day (BID) | CUTANEOUS | 0 refills | Status: DC

## 2018-02-06 MED ORDER — QUETIAPINE FUMARATE 50 MG PO TABS: 50.0000 mg | ORAL_TABLET | Freq: Two times a day (BID) | ORAL | 0 refills | Status: DC

## 2018-02-06 MED ORDER — ADULT MULTIVITAMIN W/MINERALS CH: 1.0000 | ORAL_TABLET | Freq: Every day | ORAL | 0 refills | Status: DC

## 2018-02-06 MED ORDER — TRAZODONE HCL 100 MG PO TABS: 100.0000 mg | ORAL_TABLET | Freq: Every day | ORAL | 0 refills | Status: DC

## 2018-02-06 MED ORDER — GABAPENTIN 300 MG PO CAPS: 300.0000 mg | ORAL_CAPSULE | Freq: Two times a day (BID) | ORAL | 0 refills | Status: DC

## 2018-02-06 NOTE — BHH Suicide Risk Assessment (Signed)
[facility] Discharge Suicide Risk Assessment   Principal Problem: Substance induced mood disorder [facility]) Discharge Diagnoses:  Patient Active Problem List   Diagnosis Date Noted  . Major depressive disorder, recurrent episode, severe ([place]) [F33.2] 01/30/2018  . Substance induced mood disorder ([place]) [F19.94] 01/30/2018  . MDD (major depressive disorder), single episode, severe , no psychosis ([place]) [F32.2] 01/29/2018  . Hemiplegic migraine with status migrainosus [G43.401] 06/06/2015  . Chronic insomnia [F51.04] 06/06/2015  . Cocaine abuse ([place]) [F14.10] 05/22/2015  . Alcohol dependence with uncomplicated withdrawal ([place]) [F10.230]   . Headache, migraine [G43.909]   . Migraines [G43.909] 11/29/2014  . Headache [R51] 08/08/2014  . Seizures ([place]) [R56.9] 01/09/2014  . Chronic low back pain [M54.5, G89.29] 12/28/2013  . Hemiplegia, unspecified, affecting dominant side [G81.90] 12/06/2013  . CVA (cerebral infarction) [I63.9] 12/04/2013  . Leukocytosis [D72.829] 12/04/2013  . Acute Gastroenteritis [K52.9] 12/04/2013  . Hypertension [I10] 12/04/2013  . Hemiparesis ([place]) [G81.90] 12/04/2013  . Delirium tremens ([place]) [F10.231] 07/29/2013  . Alcohol withdrawal ([place]) [F10.239] 07/29/2013  . UGIB (upper gastrointestinal bleed) [K92.2] 07/29/2013  . Chest pain [R07.9] 07/28/2013    Total Time spent with patient: 30 minutes  Musculoskeletal: Strength & Muscle Tone: within normal limits Gait & Station: normal Patient leans: N/A  Psychiatric Specialty Exam: ROS no headache, no chest pain, no shortness of breath, no vomiting   Blood pressure 128/78, pulse 72, temperature 97.8 F (36.6 C), temperature source Oral, resp. rate 18, height 6' (1.829 m), weight 80.3 kg (177 lb), SpO2 98 %.Body mass index is 24.01 kg/m.  General Appearance: Well Groomed  Eye Contact::  Good  Speech:  Normal Rate409  Volume:  Normal  Mood:  improved mood, states he feels better   Affect:  Appropriate and more reactive  , vaguely anxious   Thought Process:  Linear and Descriptions of Associations: Intact  Orientation:  Full (Time, Place, and Person)  Thought Content:  no hallucinations, no delusions, future oriented  Suicidal Thoughts:  No denies any SI or self injurious ideations  Homicidal Thoughts:  No  Memory:  recent and remote grossly intact   Judgement:  Other:  improved   Insight:  improved   Psychomotor Activity:  Normal  Concentration:  Good  Recall:  Good  Fund of Knowledge:Good  Language: Good  Akathisia:  Negative  Handed:  Right  AIMS (if indicated):     Assets:  Communication Skills Desire for Improvement Resilience  Sleep:  Number of Hours: 5  Cognition: WNL  ADL's:  Intact   Mental Status Per Nursing Assessment::   On Admission:  Self-harm thoughts  Demographic Factors:  57 year old male   Loss Factors: Substance abuse/relapse, financial and family stressors    Historical Factors: History of Substance Abuse- alcohol use disorder , history of depression, history of chronic anxiety   Risk Reduction Factors:   Sense of responsibility to family, Positive coping skills or problem solving skills and states his sister is supportive   Continued Clinical Symptoms:  At this time patient is alert, attentive, well related, describes feeling better, remains vaguely anxious but is future oriented and states he is excited and very motivated with current disposition plan. Denies SI or HI, no psychotic symptoms , future oriented . Behavior on unit in good control, pleasant on approach. Tolerating medications well, denies side effects.   Cognitive Features That Contribute To Risk:  No gross cognitive deficits noted upon discharge. Is alert , attentive, and oriented x 3   Suicide Risk:  Mild:  Suicidal ideation of limited frequency, intensity, duration, and specificity.  There are no identifiable plans, no associated intent, mild dysphoria and related symptoms, good self-control (both  objective and subjective assessment), few other risk factors, and identifiable protective factors, including available and accessible social support.  Follow-up Information    [facility] [place] [facility] Follow up on 02/07/2018.   Why:  Patient discharging Saturday (02/06/18) and will travel to [place], [place] for admission Sunday morning (02/07/18) for residential addiction treatment at [facility].  Contact information: [place]  ([address])  [address]  [place], [place]  Phone: [phone]  Fax: [phone]          Plan Of Care/Follow-up recommendations:  Activity:  as tolerated  Diet:  heart healthy Tests:  NA Other:  See below  Patient is discharging in good spirits, plans to go to [facility], a long term residential setting in [place]. Sister will pick him up and take him to bus depot, travels today.   Jenne Campus, MD 02/06/2018, 8:10 AM

## 2018-02-06 NOTE — Progress Notes (Signed)
  [facility] Adult Case Management Discharge Plan :  Will you be returning to the same living situation after discharge:  No. At discharge, do you have transportation home?: Yes,  sister and bus  Do you have the ability to pay for your medications: No.  Release of information consent forms completed and turned in to Medical Records by CSW.   Patient to Follow up at: Follow-up Information    [facility] Follow up on 02/07/2018.   Why:  Patient discharging Saturday (02/06/18) and will travel to [place], [place] for admission Sunday morning (02/07/18) for residential addiction treatment at [facility].  Contact information: [place]  ([address])  [address], [place]  Phone: [phone]  Fax: [phone]          Next level of care provider has access to [place] and Suicide Prevention discussed: Yes,  with [name]  Have you used any form of tobacco in the last 30 days? (Cigarettes, Smokeless Tobacco, Cigars, and/or Pipes): Yes  Has patient been referred to the Quitline?: Patient refused referral  Patient has been referred for addiction treatment: Yes  Maretta Los, LCSW 02/06/2018, 8:26 AM

## 2018-02-06 NOTE — Progress Notes (Addendum)
D. Pt presents with a sad affect with calm cooperative, appropriate behavior- pleasant and friendly during interactions. Per pt's self inventory, pt rates his depression, hopelessness and anxiety a 03/21/09, respectively. Pt writes that his most important goal today is "getting to rehab". Pt currently denies SI/HI and AV hallucinations. A. Labs and vitals monitored. Pt compliant with medications. Pt supported emotionally and encouraged to express concerns and ask questions.   R. Pt remains safe with 15 minute checks. Will continue POC.

## 2018-02-06 NOTE — Discharge Summary (Addendum)
Physician Discharge Summary Note  Patient:  Spencer Mendoza is an 57 y.o., male MRN:  [id number] DOB:  [birth year] Patient phone:  [phone] (home)  Patient address:   Home [address],  Total Time spent with patient: 30 minutes  Date of Admission:  01/29/2018 Date of Discharge: 02/06/18  Reason for Admission:  Per Admission Assessment Note: Spencer Mendoza is a 57 year-old white male voluntarily admitted to [facility] behavioral health unit on 3/15 after having suicidal ideations with a plan.  I met with the patient on the unit, he seemed enthusiastic about finding an area where he could privately discuss his concerns with me.  When asked why he was here, he stated, "I was going to kill myself and still will if something doesn't change".  Reports a remarkable history positive for "migraines" that he dates back to his childhood.  States he has tried neurologist recommendations that were unsuccessful; remarks he does not want to see a pain management specialist, "I don't want to get addicted to those drugs".  On 3/15, patient reports purchasing approximately $700.00 worth of heroine.   States he uses street drugs to help silence the migraines.  Per patient, he had intent of ending his life but was interrupted by a visit from a male friend.  She was able to get him to realize he needed professional help and agreed to evaluation.  [name] endorses previous suicide attempts.  Reports purchasing copious amounts of "crack" to end his life.  States he consumed the crack but it did not work, " I am still here".  He was recently discharged in February 2019, from [employer] in [place], drank as soon as he got off the bus.  Infuriating his sister, who had paid many thousands of dollars to go.  Already alienated the rest of the family.  No homicidal ideations or hallucinations.     Principal Problem: Substance induced mood disorder [facility]) Discharge Diagnoses: Patient  Active Problem List   Diagnosis Date Noted  . Major depressive disorder, recurrent episode, severe ([place]) [F33.2] 01/30/2018  . Substance induced mood disorder ([place]) [F19.94] 01/30/2018  . MDD (major depressive disorder), single episode, severe , no psychosis ([place]) [F32.2] 01/29/2018  . Hemiplegic migraine with status migrainosus [G43.401] 06/06/2015  . Chronic insomnia [F51.04] 06/06/2015  . Cocaine abuse ([place]) [F14.10] 05/22/2015  . Alcohol dependence with uncomplicated withdrawal ([place]) [F10.230]   . Headache, migraine [G43.909]   . Migraines [G43.909] 11/29/2014  . Headache [R51] 08/08/2014  . Seizures ([place]) [R56.9] 01/09/2014  . Chronic low back pain [M54.5, G89.29] 12/28/2013  . Hemiplegia, unspecified, affecting dominant side [G81.90] 12/06/2013  . CVA (cerebral infarction) [I63.9] 12/04/2013  . Leukocytosis [D72.829] 12/04/2013  . Acute Gastroenteritis [K52.9] 12/04/2013  . Hypertension [I10] 12/04/2013  . Hemiparesis ([place]) [G81.90] 12/04/2013  . Delirium tremens ([place]) [F10.231] 07/29/2013  . Alcohol withdrawal ([place]) [F10.239] 07/29/2013  . UGIB (upper gastrointestinal bleed) [K92.2] 07/29/2013  . Chest pain [R07.9] 07/28/2013    Past Psychiatric History: depression, substance abuse   Past Medical History:  Past Medical History:  Diagnosis Date  . Allergy   . Anxiety   . Bipolar disorder ([place])   . Chronic insomnia 06/06/2015  . Depression   . Epilepsy ([place])   . Hemiplegic migraine with status migrainosus 06/06/2015  . Hypertension   . Migraine   . Stroke [facility])    patient denies  . Substance abuse [facility])     Past Surgical History:  Procedure Laterality Date  . APPENDECTOMY    . KNEE ARTHROSCOPY Right   . NOSE SURGERY     Family History:  Family History  Problem Relation Age of Onset  . Heart disease Mother   . Stroke Mother   . Epilepsy Mother   . Heart disease Father   . Hypertension Father   . Melanoma Father   . Migraines Father   . Colon cancer  Sister   . Colon cancer Cousin    Family Psychiatric  History: Denies Social History:  Social History   Substance and Sexual Activity  Alcohol Use Yes  . Alcohol/week: 3.6 oz  . Types: 6 Cans of beer per week   Comment: 6 drinks per day prior to one month ago     Social History   Substance and Sexual Activity  Drug Use Yes  . Frequency: 3.0 times per week  . Types: Marijuana, Heroin, Cocaine   Comment: crack, heroin    Social History   Socioeconomic History  . Marital status: Divorced    Spouse name: Not on file  . Number of children: 0  . Years of education: GED  . Highest education level: Not on file  Occupational History  . Occupation: disabled  Social Needs  . Financial resource strain: Not on file  . Food insecurity:    Worry: Not on file    Inability: Not on file  . Transportation needs:    Medical: Not on file    Non-medical: Not on file  Tobacco Use  . Smoking status: Current Every Day Smoker    Packs/day: 1.00    Years: 15.00    Pack years: 15.00    Types: Cigarettes    Last attempt to quit: 03/20/2015    Years since quitting: 2.8  . Smokeless tobacco: Never Used  Substance and Sexual Activity  . Alcohol use: Yes    Alcohol/week: 3.6 oz    Types: 6 Cans of beer per week    Comment: 6 drinks per day prior to one month ago  . Drug use: Yes    Frequency: 3.0 times per week    Types: Marijuana, Heroin, Cocaine    Comment: crack, heroin  . Sexual activity: Yes    Birth control/protection: Condom  Lifestyle  . Physical activity:    Days per week: Not on file    Minutes per session: Not on file  . Stress: Not on file  Relationships  . Social connections:    Talks on phone: Not on file    Gets together: Not on file    Attends religious service: Not on file    Active member of club or organization: Not on file    Attends meetings of clubs or organizations: Not on file    Relationship status: Not on file  Other Topics Concern  . Not on file   Social History Narrative   Pt lives with significant other.    Patient drinks 2 cups of caffeine daily.   Patient is right handed.    Hospital Course:  EGYPT MARCHIANO was admitted for Substance induced mood disorder [facility]) and crisis management. He was treated with the standard protocol for alcohol detox.  Medical problems were identified and treated.  Home medications were restarted as appropriate.  Improvement was monitored by CIWA/COWS scores and Esco Joslyn Tapia's daily report of withdrawal symptom reduction.  Emotional and mental status was monitored by daily self-inventory reports completed by Rocklin Callas and clinical  staff.  He was evaluated by the treatment team for stability and plans for continued recovery upon discharge.  Kanai Berrios Oscar was encouraged to participate in unit programming to learn coping skills, problem solving, relaxation techniques, as well as NA/AA programs.  He was offered further treatment options upon discharge including Residential, Intensive Outpatient and Outpatient treatment.  The patient's motivation was an integral factor for scheduling further treatment.  Employment, transportation, bed availability, health status, family support, and any pending legal issues were also considered.  Upon completion of detox and stabilization BRYNDEN THUNE was both mentally and medically stable for discharge.  He vehemently denied suicidal/homicidal ideation, auditory/visual hallucinations, delusional thoughts, and paranoia; as well as any withdrawal symptoms.  Physical Findings: AIMS: Facial and Oral Movements Muscles of Facial Expression: None, normal Lips and Perioral Area: None, normal Jaw: None, normal Tongue: None, normal,Extremity Movements Upper (arms, wrists, hands, fingers): None, normal Lower (legs, knees, ankles, toes): None, normal, Trunk Movements Neck, shoulders, hips: None, normal, Overall Severity Severity of abnormal movements (highest score  from questions above): None, normal Incapacitation due to abnormal movements: None, normal Patient's awareness of abnormal movements (rate only patient's report): No Awareness, Dental Status Current problems with teeth and/or dentures?: Yes Does patient usually wear dentures?: No  CIWA:  CIWA-Ar Total: 3 COWS:  COWS Total Score: 0  Musculoskeletal: Strength & Muscle Tone: within normal limits Gait & Station: normal Patient leans: N/A  Psychiatric Specialty Exam: Physical Exam  Vitals reviewed. Constitutional: He is oriented to person, place, and time. He appears well-developed and well-nourished.  HENT:  Head: Normocephalic.  Neck: Normal range of motion.  Respiratory: Effort normal.  Musculoskeletal: Normal range of motion.  Neurological: He is alert and oriented to person, place, and time.  Skin: Skin is warm and dry.    Review of Systems  Cardiovascular:       Hypertension  Neurological: Seizures: History of seizure disorder.  Psychiatric/Behavioral: Depression: Stable. Hallucinations: Denies. Memory loss: Denies. Substance abuse: Alcohol and cocaine. Suicidal ideas: Denies. Nervous/anxious: Stable. Insomnia: Stable.   All other systems reviewed and are negative.   Blood pressure 128/78, pulse 72, temperature 97.8 F (36.6 C), temperature source Oral, resp. rate 18, height 6' (1.829 m), weight 80.3 kg (177 lb), SpO2 98 %.Body mass index is 24.01 kg/m.  General Appearance: Casual and Neat  Eye Contact:  Good  Speech:  Clear and Coherent and Normal Rate  Volume:  Normal  Mood:  Appropriate  Affect:  Appropriate  Thought Process:  Coherent and Goal Directed  Orientation:  Full (Time, Place, and Person)  Thought Content:  Logical  Suicidal Thoughts:  No  Homicidal Thoughts:  No  Memory:  Immediate;   Good Recent;   Good Remote;   Good  Judgement:  Intact  Insight:  Present  Psychomotor Activity:  Normal  Concentration:  Concentration: Good and Attention Span: Good   Recall:  Good  Fund of Knowledge:  Good  Language:  Good  Akathisia:  No  Handed:  Right  AIMS (if indicated):     Assets:  Communication Skills Desire for Improvement Resilience Social Support  ADL's:  Intact  Cognition:  WNL  Sleep:  Number of Hours: 5     Have you used any form of tobacco in the last 30 days? (Cigarettes, Smokeless Tobacco, Cigars, and/or Pipes): Yes  Has this patient used any form of tobacco in the last 30 days? (Cigarettes, Smokeless Tobacco, Cigars, and/or Pipes) Yes, No  Blood Alcohol  level:  Lab Results  Component Value Date   ETH <10 01/28/2018   ETH 139 (H) 59/74/1638    Metabolic Disorder Labs:  Lab Results  Component Value Date   HGBA1C 4.9 02/01/2018   MPG 93.93 02/01/2018   MPG 97 12/05/2013   Lab Results  Component Value Date   PROLACTIN 15.2 02/01/2018   Lab Results  Component Value Date   CHOL 199 02/01/2018   TRIG 229 (H) 02/01/2018   HDL 55 02/01/2018   CHOLHDL 3.6 02/01/2018   VLDL 46 (H) 02/01/2018   LDLCALC 98 02/01/2018   LDLCALC 62 12/05/2013    See Psychiatric Specialty Exam and Suicide Risk Assessment completed by Attending Physician prior to discharge.  Discharge destination:  [facility]  Is patient on multiple antipsychotic therapies at discharge:  No   Has Patient had three or more failed trials of antipsychotic monotherapy by history:  No  Recommended Plan for Multiple Antipsychotic Therapies: NA   Allergies as of 02/06/2018      Reactions   Codeine Nausea And Vomiting   Depakote [divalproex Sodium]    Made patient shake   Migranal [dihydroergotamine]    Nausea   Topamax [topiramate]    Made patient angry      Medication List    STOP taking these medications   aluminum-magnesium hydroxide-simethicone 200-200-20 MG/5ML Susp Commonly known as:  MAALOX   amitriptyline 25 MG tablet Commonly known as:  ELAVIL   BC HEADACHE POWDER PO   busPIRone 10 MG tablet Commonly known as:   BUSPAR   ibuprofen 800 MG tablet Commonly known as:  ADVIL,MOTRIN   MILK OF MAGNESIA PO     TAKE these medications     Indication  FLUoxetine 20 MG capsule Commonly known as:  PROZAC Take 1 capsule (20 mg total) by mouth daily. Start taking on:  02/07/2018 What changed:    medication strength  how much to take  Indication:  Major Depressive Disorder   gabapentin 300 MG capsule Commonly known as:  NEURONTIN Take 1 capsule (300 mg total) by mouth 2 (two) times daily.  Indication:  Abuse or Misuse of Alcohol, Disease of the Peripheral Nerves   hydrocortisone cream 1 % Apply topically 2 (two) times daily.  Indication:  Itching   IMITREX 50 MG tablet Generic drug:  SUMAtriptan Take 50 mg by mouth daily as needed (MIGRAINES).  Indication:  Headache   levETIRAcetam 500 MG tablet Commonly known as:  KEPPRA Take 500 mg by mouth 2 (two) times daily.  Indication:  Seizure   multivitamin with minerals Tabs tablet Take 1 tablet by mouth daily. Start taking on:  02/07/2018  Indication:  Nutritional Support   propranolol 20 MG tablet Commonly known as:  INDERAL Take 1 tablet (20 mg total) by mouth 2 (two) times daily. What changed:    medication strength  how much to take  Another medication with the same name was removed. Continue taking this medication, and follow the directions you see here.  Indication:  Hypertension   QUEtiapine 300 MG tablet Commonly known as:  SEROQUEL Take 1 tablet (300 mg total) by mouth at bedtime. What changed:    medication strength  how much to take  when to take this  additional instructions  Indication:  Mood stabilization   QUEtiapine 50 MG tablet Commonly known as:  SEROQUEL Take 1 tablet (50 mg total) by mouth 2 (two) times daily. What changed:  You were already taking a medication with the  same name, and this prescription was added. Make sure you understand how and when to take each.  Indication:  Generalized Anxiety  Disorder, Mood stabilization   thiamine 100 MG tablet Commonly known as:  VITAMIN B-1 Take 100 mg by mouth daily.  Indication:  Deficiency of Vitamin B1   traZODone 100 MG tablet Commonly known as:  DESYREL Take 1 tablet (100 mg total) by mouth at bedtime.  Indication:  Trouble Sleeping      Follow-up Information    [facility] Follow up on 02/07/2018.   Why:  Patient discharging Saturday (02/06/18) and will travel to [place], [place] for admission Sunday morning (02/07/18) for residential addiction treatment at [facility].  Contact information: [place]  ([address])  [address]  [place], [place]  Phone: [phone]  Fax: [phone]          Follow-up recommendations:  Activity:  As tolerated Diet:  Heart healthy  Comments:  RAI SEVERNS has been instructed to take medications as prescribed; and report adverse effects to outpatient provider.  Follow up with primary doctor for any medical issues and If symptoms recur report to nearest emergency or crisis hot line.    SignedEarleen Newport, NP 02/06/2018, 8:53 AM   Patient seen, Suicide Assessment Completed.  Disposition Plan Reviewed

## 2018-02-06 NOTE — Progress Notes (Signed)
Pt discharged to lobby. Sister in lobby to take pt to bus station.Pt was stable and appreciative at that time. All papers and prescriptions, samples were given and valuables returned. Verbal understanding expressed. Denies SI/HI and A/VH. Pt given opportunity to express concerns and ask questions.

## 2018-02-06 NOTE — BHH Group Notes (Signed)
[facility] LCSW Group Therapy Note  Date/Time:    02/06/2018 10:00-11:00AM  Type of Therapy and Topic:  Group Therapy:  Healthy vs Unhealthy Coping Skills  Participation Level:  Active   Description of Group:  The focus of this group was to determine what unhealthy coping techniques typically are used by group members and what healthy coping techniques would be helpful in coping with various problems. Patients were guided in becoming aware of the differences between healthy and unhealthy coping techniques.  Patients were asked to identify 1-2 healthy coping skills they would like to learn to use more effectively, and many mentioned meditation, breathing, and relaxation.  These were explained, samples demonstrated, and resources shared for how to learn more at discharge.   At the end of group, additional ideas of healthy coping skills were shared in a fun exercise.  Therapeutic Goals 1. Patients learned that coping is what human beings do all day long to deal with various situations in their lives 2. Patients defined and discussed healthy vs unhealthy coping techniques 3. Patients identified their preferred coping techniques and identified whether these were healthy or unhealthy 4. Patients determined 1-2 healthy coping skills they would like to become more familiar with and use more often, and practiced a few meditations 5. Patients provided support and ideas to each other  Summary of Patient Progress: During group, patient expressed that his healthy coping skills include removing himself from the situation and his unhealthy ones tends to focus on drugs, alcohol, and hurting people.  He left in the middle of group to discharge.   Therapeutic Modalities Cognitive Behavioral Therapy Motivational Interviewing   Selmer Dominion, LCSW 02/06/2018, 1:31 PM

## 2018-05-11 ENCOUNTER — Encounter (HOSPITAL_COMMUNITY): Payer: Self-pay

## 2018-05-11 ENCOUNTER — Other Ambulatory Visit: Payer: Self-pay

## 2018-05-11 ENCOUNTER — Emergency Department (HOSPITAL_COMMUNITY)
Admission: EM | Admit: 2018-05-11 | Discharge: 2018-05-11 | Disposition: A | Discharge status: Home / Self Care | Payer: Medicare Other | Attending: Emergency Medicine | Admitting: Emergency Medicine

## 2018-05-11 ENCOUNTER — Emergency Department (HOSPITAL_COMMUNITY): Payer: Medicare Other

## 2018-05-11 DIAGNOSIS — R112 Nausea with vomiting, unspecified: Secondary | ICD-10-CM | POA: Insufficient documentation

## 2018-05-11 DIAGNOSIS — R197 Diarrhea, unspecified: Secondary | ICD-10-CM | POA: Diagnosis not present

## 2018-05-11 DIAGNOSIS — Z79899 Other long term (current) drug therapy: Secondary | ICD-10-CM | POA: Insufficient documentation

## 2018-05-11 DIAGNOSIS — I10 Essential (primary) hypertension: Secondary | ICD-10-CM | POA: Insufficient documentation

## 2018-05-11 DIAGNOSIS — R51 Headache: Secondary | ICD-10-CM | POA: Insufficient documentation

## 2018-05-11 DIAGNOSIS — F17210 Nicotine dependence, cigarettes, uncomplicated: Secondary | ICD-10-CM | POA: Insufficient documentation

## 2018-05-11 DIAGNOSIS — R519 Headache, unspecified: Secondary | ICD-10-CM

## 2018-05-11 LAB — URINALYSIS, ROUTINE W REFLEX MICROSCOPIC
Bilirubin Urine: NEGATIVE
Glucose, UA: NEGATIVE mg/dL
Hgb urine dipstick: NEGATIVE
Ketones, ur: 5 mg/dL — AB
Leukocytes, UA: NEGATIVE
Nitrite: NEGATIVE
Protein, ur: NEGATIVE mg/dL
Specific Gravity, Urine: 1.012 (ref 1.005–1.030)
pH: 7 (ref 5.0–8.0)

## 2018-05-11 LAB — COMPREHENSIVE METABOLIC PANEL
ALT: 18 U/L (ref 0–44)
AST: 24 U/L (ref 15–41)
Albumin: 5 g/dL (ref 3.5–5.0)
Alkaline Phosphatase: 81 U/L (ref 38–126)
Anion gap: 12 (ref 5–15)
BUN: 7 mg/dL (ref 6–20)
CO2: 30 mmol/L (ref 22–32)
Calcium: 10.6 mg/dL — ABNORMAL HIGH (ref 8.9–10.3)
Chloride: 98 mmol/L (ref 98–111)
Creatinine, Ser: 1.04 mg/dL (ref 0.61–1.24)
GFR calc Af Amer: >60 mL/min (ref >60)
GFR calc non Af Amer: >60 mL/min (ref >60)
Glucose, Bld: 140 mg/dL — ABNORMAL HIGH (ref 70–99)
Potassium: 3.8 mmol/L (ref 3.5–5.1)
Sodium: 140 mmol/L (ref 135–145)
Total Bilirubin: 1.2 mg/dL (ref 0.3–1.2)
Total Protein: 8.1 g/dL (ref 6.5–8.1)

## 2018-05-11 LAB — RAPID URINE DRUG SCREEN, HOSP PERFORMED
Amphetamines: NOT DETECTED
Benzodiazepines: NOT DETECTED
Cocaine: NOT DETECTED
Opiates: NOT DETECTED
Tetrahydrocannabinol: POSITIVE — AB

## 2018-05-11 LAB — CBC WITH DIFFERENTIAL/PLATELET
Abs Immature Granulocytes: 0.1 10*3/uL (ref 0.0–0.1)
Basophils Absolute: 0.1 10*3/uL (ref 0.0–0.1)
Basophils Relative: 1 %
Eosinophils Absolute: 0.5 10*3/uL (ref 0.0–0.7)
Eosinophils Relative: 5 %
HCT: 49.3 % (ref 39.0–52.0)
Hemoglobin: 16.8 g/dL (ref 13.0–17.0)
Immature Granulocytes: 1 %
Lymphocytes Relative: 20 %
Lymphs Abs: 2.1 10*3/uL (ref 0.7–4.0)
MCH: 31 pg (ref 26.0–34.0)
MCHC: 34.1 g/dL (ref 30.0–36.0)
MCV: 91 fL (ref 78.0–100.0)
Monocytes Absolute: 1 10*3/uL (ref 0.1–1.0)
Monocytes Relative: 9 %
Neutro Abs: 7 10*3/uL (ref 1.7–7.7)
Neutrophils Relative %: 64 %
Platelets: 356 10*3/uL (ref 150–400)
RBC: 5.42 MIL/uL (ref 4.22–5.81)
RDW: 13.4 % (ref 11.5–15.5)
WBC: 10.8 10*3/uL — ABNORMAL HIGH (ref 4.0–10.5)

## 2018-05-11 LAB — ETHANOL: Alcohol, Ethyl (B): <10 mg/dL (ref <10)

## 2018-05-11 MED ORDER — DIPHENHYDRAMINE HCL 50 MG/ML IJ SOLN
12.5000 mg | Freq: Once | INTRAMUSCULAR | Status: DC
  Filled 2018-05-11: qty 1

## 2018-05-11 MED ORDER — FAMOTIDINE 20 MG PO TABS
20.0000 mg | ORAL_TABLET | Freq: Once | ORAL | Status: AC
  Administered 2018-05-11: 20 mg via ORAL
  Filled 2018-05-11: qty 1

## 2018-05-11 MED ORDER — GI COCKTAIL ~~LOC~~
30.0000 mL | Freq: Once | ORAL | Status: AC
  Administered 2018-05-11: 30 mL via ORAL
  Filled 2018-05-11: qty 30

## 2018-05-11 MED ORDER — PROCHLORPERAZINE EDISYLATE 10 MG/2ML IJ SOLN
10.0000 mg | Freq: Once | INTRAMUSCULAR | Status: DC
  Filled 2018-05-11: qty 2

## 2018-05-11 MED ORDER — SODIUM CHLORIDE 0.9 % IV BOLUS
1000.0000 mL | Freq: Once | INTRAVENOUS | Status: AC
  Administered 2018-05-11: 1000 mL via INTRAVENOUS

## 2018-05-11 NOTE — ED Notes (Signed)
Patient transported to CT 

## 2018-05-11 NOTE — ED Notes (Signed)
Pt eloped with wife.

## 2018-05-11 NOTE — ED Triage Notes (Signed)
Pt reports that [place] and that his medications got messed up and he is not sure what he is supposed to be taking.

## 2018-05-11 NOTE — Progress Notes (Signed)
Patient in [place] at this time.  Will await until he is medically cleared before making medication recommendations.    Waylan Boga, PMHNP

## 2018-05-11 NOTE — ED Notes (Signed)
Pt is poor historian about current condition answer many questions "I don't know" He does report some NVD, and head ache

## 2018-05-11 NOTE — ED Notes (Signed)
Family at bedside. 

## 2018-05-11 NOTE — ED Notes (Signed)
PT informed of the need of a urine sample. Pt stated that he couldn't urinate at this time. Will try again later. Urinal at bedside.

## 2018-05-11 NOTE — ED Provider Notes (Signed)
Patient placed in Quick Look pathway, seen and evaluated   Chief Complaint: Med refil  HPI:   Patient presents today for evaluation of medication changes and not feeling well.  He reports that his head is hurting.  He reports generally not feeling well but is unable to further describe.  Multiple medication changes, by hospital in [place] [facility].  Denies SI/HI.   ROS: No fevers, Nausea, vomiting, diarrhea, and constipation  Physical Exam:   Gen: No distress  Neuro: Awake and Alert  Skin: Warm    Focused Exam: uncomfortable holding his head. Unable to stay still.     Initiation of care has begun. The patient has been counseled on the process, plan, and necessity for staying for the completion/evaluation, and the remainder of the medical screening examination    Spencer Mendoza 05/11/18 1336    Tegeler, Gwenyth Allegra, MD 05/11/18 [phone]

## 2018-05-11 NOTE — ED Provider Notes (Signed)
[place] EMERGENCY DEPARTMENT Provider Note   CSN: [id number] Arrival date & time: 05/11/18  1321     History   Chief Complaint Chief Complaint  Patient presents with  . Medication Refill    HPI Spencer Mendoza is a 57 y.o. male with a hx of tobacco abuse, hemiplegic migraine, insomnia, anxiety, bipolar disorder, depression, epilepsy, HTN, stroke, alcohol abuse, and polysubstance abuse who presents to the ED with complaints of "I don't feel good." Patient states he has not felt well for a few weeks now. He does not give me specific information regarding this. He states he has had a headache, gradual onset, steady progression, similar to previous. Pain is to the posterior aspect of the head. Rates pain a 7/10 in severity, no specific alleviating/aggravating factors. Patient reports nausea and a few episodes of non bloody emesis over past few days, he has also had a few episodes of non bloody diarrhea. Appetite has been decreased. Per patient's wife he has generally not felt well and has been somewhat "out of it" and much more sleepy over past 1 week. He was at a behavioral health facility in [place] where he had some medication changes. He was subsequently seen by [facility] 1 week ago- some medications were discontinued, he was started on Suboxone and Vraylar. Per wife she realized he was taking his Suboxone and Prozac inappropriately- over dosing or taking meds too soon, since she started managing meds his mental status has improved- still remains somewhat out of it/sleepy. Denies fever, abdominal pain, change in vision, numbness, or weakness. Denies chest pain or dyspnea. Patient denies EtOH or drug use. Denies HI/SI. Denies hallucinations.   HPI  Past Medical History:  Diagnosis Date  . Allergy   . Anxiety   . Bipolar disorder ([place])   . Chronic insomnia 06/06/2015  . Depression   . Epilepsy ([place])   . Hemiplegic migraine with status migrainosus  06/06/2015  . Hypertension   . Migraine   . Stroke [facility])    patient denies  . Substance abuse [facility])     Patient Active Problem List   Diagnosis Date Noted  . Major depressive disorder, recurrent episode, severe ([place]) 01/30/2018  . Substance induced mood disorder ([place]) 01/30/2018  . MDD (major depressive disorder), single episode, severe , no psychosis ([place]) 01/29/2018  . Hemiplegic migraine with status migrainosus 06/06/2015  . Chronic insomnia 06/06/2015  . Cocaine abuse ([place]) 05/22/2015  . Alcohol dependence with uncomplicated withdrawal ([place])   . Headache, migraine   . Migraines 11/29/2014  . Headache 08/08/2014  . Seizures ([place]) 01/09/2014  . Chronic low back pain 12/28/2013  . Hemiplegia, unspecified, affecting dominant side 12/06/2013  . CVA (cerebral infarction) 12/04/2013  . Leukocytosis 12/04/2013  . Acute Gastroenteritis 12/04/2013  . Hypertension 12/04/2013  . Hemiparesis ([place]) 12/04/2013  . Delirium tremens ([place]) 07/29/2013  . Alcohol withdrawal ([place]) 07/29/2013  . UGIB (upper gastrointestinal bleed) 07/29/2013  . Chest pain 07/28/2013    Past Surgical History:  Procedure Laterality Date  . APPENDECTOMY    . KNEE ARTHROSCOPY Right   . NOSE SURGERY          Home Medications    Prior to Admission medications   Medication Sig Start Date End Date Taking? Authorizing Provider  FLUoxetine (PROZAC) 20 MG capsule Take 1 capsule (20 mg total) by mouth daily. 02/07/18   Rankin, Shuvon B, NP  gabapentin (NEURONTIN) 300 MG capsule Take 1 capsule (300 mg total) by  mouth 2 (two) times daily. 02/06/18   Rankin, Shuvon B, NP  hydrocortisone cream 1 % Apply topically 2 (two) times daily. 02/06/18   Rankin, Shuvon B, NP  levETIRAcetam (KEPPRA) 500 MG tablet Take 500 mg by mouth 2 (two) times daily.    [provider]  Multiple Vitamin (MULTIVITAMIN WITH MINERALS) TABS tablet Take 1 tablet by mouth daily. 02/07/18   Rankin, Shuvon B, NP  propranolol (INDERAL) 20 MG  tablet Take 1 tablet (20 mg total) by mouth 2 (two) times daily. 02/06/18   Rankin, Shuvon B, NP  QUEtiapine (SEROQUEL) 300 MG tablet Take 1 tablet (300 mg total) by mouth at bedtime. 02/06/18   Rankin, Shuvon B, NP  QUEtiapine (SEROQUEL) 50 MG tablet Take 1 tablet (50 mg total) by mouth 2 (two) times daily. 02/06/18   Rankin, Shuvon B, NP  SUMAtriptan (IMITREX) 50 MG tablet Take 50 mg by mouth daily as needed (MIGRAINES).     [provider]  thiamine (VITAMIN B-1) 100 MG tablet Take 100 mg by mouth daily.     [provider]  traZODone (DESYREL) 100 MG tablet Take 1 tablet (100 mg total) by mouth at bedtime. 02/06/18   Rankin, Mercy Moore, NP    Family History Family History  Problem Relation Age of Onset  . Heart disease Mother   . Stroke Mother   . Epilepsy Mother   . Heart disease Father   . Hypertension Father   . Melanoma Father   . Migraines Father   . Colon cancer Sister   . Colon cancer Cousin     Social History Social History   Tobacco Use  . Smoking status: Current Every Day Smoker    Packs/day: 1.00    Years: 15.00    Pack years: 15.00    Types: Cigarettes  . Smokeless tobacco: Never Used  Substance Use Topics  . Alcohol use: Yes    Alcohol/week: 3.6 oz    Types: 6 Cans of beer per week    Comment: pt reports no ETOH and no durgs in 3 moths.   . Drug use: Yes    Frequency: 3.0 times per week    Types: Marijuana, Heroin, Cocaine    Comment: crack, heroin pt reports no ETOH and no durgs in 3 moths.      Allergies   Codeine; Depakote [divalproex sodium]; Migranal [dihydroergotamine]; and Topamax [topiramate]   Review of Systems Review of Systems  Constitutional: Negative for chills and fever.  Eyes: Negative for visual disturbance.  Respiratory: Negative for shortness of breath.   Cardiovascular: Negative for chest pain.  Gastrointestinal: Positive for diarrhea, nausea and vomiting. Negative for abdominal pain, blood in stool and  constipation.  Neurological: Positive for headaches. Negative for dizziness, seizures, syncope, weakness and numbness.  Psychiatric/Behavioral: Positive for confusion. Negative for suicidal ideas.       Negative for HI    Physical Exam Updated Vital Signs BP (!) 134/105 (BP Location: Right Arm)   Pulse 74   Temp 97.7 F (36.5 C) (Oral)   Resp 18   Ht 6' (1.829 m)   Wt 81.6 kg (180 lb)   SpO2 97%   BMI 24.41 kg/m   Physical Exam  Constitutional: He appears well-developed and well-nourished. No distress.  HENT:  Head: Normocephalic and atraumatic.  Eyes: Conjunctivae are normal. Right eye exhibits no discharge. Left eye exhibits no discharge.  Neck: Normal range of motion. Neck supple. No neck rigidity.  Cardiovascular: Normal rate and  regular rhythm.  No murmur heard. Pulmonary/Chest: Breath sounds normal. No respiratory distress. He has no wheezes. He has no rales.  Abdominal: Soft. He exhibits no distension. There is no tenderness. There is no rebound and no guarding.  Neurological: He is alert.  Clear speech. Patient is a poor historian, continues to answer "I just do not feel well" to questions. He is aware that we are in a hospital in [place]. Refusing to answer regarding time orientation. CN III-XII grossly intact. Sensation grossly intact x 4. 5/5 symmetric grip strength. 5/5 strength with plantar/dorsiflexion. Negative pronator drift. Gait intact. Tremulous with finger to nose bilaterally.   Skin: Skin is warm and dry. No rash noted.  Psychiatric: He is not actively hallucinating. He expresses no homicidal and no suicidal ideation. He expresses no suicidal plans and no homicidal plans.  Flat affect.   Nursing note and vitals reviewed.   ED Treatments / Results  Labs Results for orders placed or performed during the hospital encounter of 05/11/18  Comprehensive metabolic panel  Result Value Ref Range   Sodium 140 135 - 145 mmol/L   Potassium 3.8 3.5 - 5.1  mmol/L   Chloride 98 98 - 111 mmol/L   CO2 30 22 - 32 mmol/L   Glucose, Bld 140 (H) 70 - 99 mg/dL   BUN 7 6 - 20 mg/dL   Creatinine, Ser 1.04 0.61 - 1.24 mg/dL   Calcium 10.6 (H) 8.9 - 10.3 mg/dL   Total Protein 8.1 6.5 - 8.1 g/dL   Albumin 5.0 3.5 - 5.0 g/dL   AST 24 15 - 41 U/L   ALT 18 0 - 44 U/L   Alkaline Phosphatase 81 38 - 126 U/L   Total Bilirubin 1.2 0.3 - 1.2 mg/dL   GFR calc non Af Amer >60 >60 mL/min   GFR calc Af Amer >60 >60 mL/min   Anion gap 12 5 - 15  CBC with Differential  Result Value Ref Range   WBC 10.8 (H) 4.0 - 10.5 K/uL   RBC 5.42 4.22 - 5.81 MIL/uL   Hemoglobin 16.8 13.0 - 17.0 g/dL   HCT 49.3 39.0 - 52.0 %   MCV 91.0 78.0 - 100.0 fL   MCH 31.0 26.0 - 34.0 pg   MCHC 34.1 30.0 - 36.0 g/dL   RDW 13.4 11.5 - 15.5 %   Platelets 356 150 - 400 K/uL   Neutrophils Relative % 64 %   Neutro Abs 7.0 1.7 - 7.7 K/uL   Lymphocytes Relative 20 %   Lymphs Abs 2.1 0.7 - 4.0 K/uL   Monocytes Relative 9 %   Monocytes Absolute 1.0 0.1 - 1.0 K/uL   Eosinophils Relative 5 %   Eosinophils Absolute 0.5 0.0 - 0.7 K/uL   Basophils Relative 1 %   Basophils Absolute 0.1 0.0 - 0.1 K/uL   Immature Granulocytes 1 %   Abs Immature Granulocytes 0.1 0.0 - 0.1 K/uL  Urinalysis, Routine w reflex microscopic  Result Value Ref Range   Color, Urine YELLOW YELLOW   APPearance CLEAR CLEAR   Specific Gravity, Urine 1.012 1.005 - 1.030   pH 7.0 5.0 - 8.0   Glucose, UA NEGATIVE NEGATIVE mg/dL   Hgb urine dipstick NEGATIVE NEGATIVE   Bilirubin Urine NEGATIVE NEGATIVE   Ketones, ur 5 (A) NEGATIVE mg/dL   Protein, ur NEGATIVE NEGATIVE mg/dL   Nitrite NEGATIVE NEGATIVE   Leukocytes, UA NEGATIVE NEGATIVE  Ethanol  Result Value Ref Range   Alcohol, Ethyl (B) <  10 <10 mg/dL    EKG EKG Interpretation  Date/Time:  Tuesday May 11 2018 13:38:42 EDT Ventricular Rate:  92 PR Interval:  168 QRS Duration: 84 QT Interval:  348 QTC Calculation: 430 R Axis:   119 Text Interpretation:   Normal sinus rhythm Right axis deviation Septal infarct , age undetermined Abnormal ECG Confirmed by Virgel Manifold [phone]) on 05/11/2018 3:20:59 PM   Radiology Ct Head Wo Contrast  Result Date: 05/11/2018 CLINICAL DATA:  Chronic headache EXAM: CT HEAD WITHOUT CONTRAST TECHNIQUE: Contiguous axial images were obtained from the base of the skull through the vertex without intravenous contrast. COMPARISON:  CT 01/22/2018 FINDINGS: Brain: No evidence of acute infarction, hemorrhage, hydrocephalus, extra-axial collection or mass lesion/mass effect. Vascular: Negative for hyperdense vessel Skull: Negative Sinuses/Orbits: Mucosal edema paranasal sinuses with significant improvement from the prior study. No air-fluid. Normal orbit Other: None IMPRESSION: Negative CT head Improvement in sinus mucosal disease since 01/22/2018 Electronically Signed   By: Franchot Gallo M.D.   On: 05/11/2018 15:43    Procedures Procedures (including critical care time)  Medications Ordered in ED Medications  prochlorperazine (COMPAZINE) injection 10 mg (0 mg Intravenous Hold 05/11/18 1416)  diphenhydrAMINE (BENADRYL) injection 12.5 mg (0 mg Intravenous Hold 05/11/18 1415)  sodium chloride 0.9 % bolus 1,000 mL (0 mLs Intravenous Stopped 05/11/18 1518)  gi cocktail (Maalox,Lidocaine,Donnatal) (30 mLs Oral Given 05/11/18 1837)  famotidine (PEPCID) tablet 20 mg (20 mg Oral Given 05/11/18 1837)   Initial Impression / Assessment and Plan / ED Course  I have reviewed the triage vital signs and the nursing notes.  Pertinent labs & imaging results that were available during my care of the patient were reviewed by me and considered in my medical decision making (see chart for details).   Patient presents with the ED with his wife for "not feeling well." He has had headache- hx of similar headaches, gradual onset with steady progression in severity- non concerning for [facility], ICH, ischemic CVA, dural venous sinus thrombosis, acute  glaucoma, giant cell arteritis, mass, or meningitis. CT head obtained and negative for acute intracranial abnormality. He has has had N/V/D- abdominal exam benign, no peritoneal signs. He also had been "out of it" and had increased sleepiness/grogginess since new medication changes by [place]- wife states this is her main concern, patient persistently saying he is concerned because he doesn't feel well. He was started on Suboxone and Vraylar and is on additional medications including: Prozac, Keppra, Propranolol, PRN ibuprofen and PRN zofran. He was taking some of his medications improperly, but wife is now performing med management. He does appear somewhat sleepy and does not consistently answer my questions. He has no focal neurologic deficits on exam. CT head negative. ER work-up fairly benign. Mild hypercalcemia at 10.6, electrolytes otherwise WNL. Hyperglycemic at 140. Renal fxn and LFTs WNL. Nonspecific leukocytosis at 10.8. No anemia. UA without obvious infection. Ethanol WNL. UDS with canninbinoids. There does not appear to be an infectious source to patient's sxs, his labs are reassuring without identifiable etiology.    18:30: On re-evaluation with supervising physician Dr. Wilson Singer patient is complaining of some epigastric discomfort and continued nausea. He reported that he has been taking Goodie powders for headaches. Discussed with Dr. Wilson Singer- recommended GI cocktail and Pepcid with TTS consultation. Suspect grogginess/sleepiness to be related to medication changes, however no definitive etiology. He has been able to tolerate PO.   RN informed me that patient eloped with his wife who had been at bedside  throughout encounter. Patient was without SI/HI or hallucinations to indicate IVC. Discussed throughout encounter that he would likely require follow up with [facility] for medication discussion. Was unable to have formal AMA discussion prior to elopement.   Final Clinical  Impressions(s) / ED Diagnoses   Final diagnoses:  None    ED Discharge Orders    None       Leafy Kindle 05/11/18 2201    Virgel Manifold, MD 05/12/18 1250

## 2018-05-15 ENCOUNTER — Encounter (HOSPITAL_COMMUNITY): Payer: Self-pay

## 2018-05-15 ENCOUNTER — Emergency Department (HOSPITAL_COMMUNITY): Payer: Medicare Other

## 2018-05-15 ENCOUNTER — Emergency Department (HOSPITAL_COMMUNITY)
Admission: EM | Admit: 2018-05-15 | Discharge: 2018-05-15 | Disposition: A | Discharge status: Home / Self Care | Payer: Medicare Other | Attending: Emergency Medicine | Admitting: Emergency Medicine

## 2018-05-15 ENCOUNTER — Other Ambulatory Visit: Payer: Self-pay

## 2018-05-15 DIAGNOSIS — R0602 Shortness of breath: Secondary | ICD-10-CM | POA: Diagnosis present

## 2018-05-15 DIAGNOSIS — F419 Anxiety disorder, unspecified: Secondary | ICD-10-CM | POA: Insufficient documentation

## 2018-05-15 DIAGNOSIS — Z79899 Other long term (current) drug therapy: Secondary | ICD-10-CM | POA: Diagnosis not present

## 2018-05-15 DIAGNOSIS — J40 Bronchitis, not specified as acute or chronic: Secondary | ICD-10-CM | POA: Diagnosis not present

## 2018-05-15 DIAGNOSIS — I10 Essential (primary) hypertension: Secondary | ICD-10-CM | POA: Insufficient documentation

## 2018-05-15 DIAGNOSIS — Z8673 Personal history of transient ischemic attack (TIA), and cerebral infarction without residual deficits: Secondary | ICD-10-CM | POA: Diagnosis not present

## 2018-05-15 DIAGNOSIS — F17210 Nicotine dependence, cigarettes, uncomplicated: Secondary | ICD-10-CM | POA: Diagnosis not present

## 2018-05-15 LAB — CBC
HCT: 45.3 % (ref 39.0–52.0)
Hemoglobin: 15.4 g/dL (ref 13.0–17.0)
MCH: 30.6 pg (ref 26.0–34.0)
MCHC: 34 g/dL (ref 30.0–36.0)
MCV: 90.1 fL (ref 78.0–100.0)
Platelets: 366 10*3/uL (ref 150–400)
RBC: 5.03 MIL/uL (ref 4.22–5.81)
RDW: 13.4 % (ref 11.5–15.5)
WBC: 8.3 10*3/uL (ref 4.0–10.5)

## 2018-05-15 LAB — BASIC METABOLIC PANEL
Anion gap: 14 (ref 5–15)
BUN: 8 mg/dL (ref 6–20)
CO2: 24 mmol/L (ref 22–32)
Calcium: 10.1 mg/dL (ref 8.9–10.3)
Chloride: 100 mmol/L (ref 98–111)
Creatinine, Ser: 1.07 mg/dL (ref 0.61–1.24)
GFR calc Af Amer: >60 mL/min (ref >60)
GFR calc non Af Amer: >60 mL/min (ref >60)
Glucose, Bld: 131 mg/dL — ABNORMAL HIGH (ref 70–99)
Potassium: 3.9 mmol/L (ref 3.5–5.1)
Sodium: 138 mmol/L (ref 135–145)

## 2018-05-15 LAB — D-DIMER, QUANTITATIVE: D-Dimer, Quant: 0.33 ug/mL-FEU (ref 0.00–0.50)

## 2018-05-15 MED ORDER — HYDROXYZINE HCL 25 MG PO TABS: 25.0000 mg | ORAL_TABLET | Freq: Four times a day (QID) | ORAL | 0 refills | Status: DC | PRN

## 2018-05-15 MED ORDER — HYDROXYZINE HCL 25 MG PO TABS
25.0000 mg | ORAL_TABLET | Freq: Once | ORAL | Status: AC
  Administered 2018-05-15: 25 mg via ORAL
  Filled 2018-05-15: qty 1

## 2018-05-15 MED ORDER — AZITHROMYCIN 250 MG PO TABS: 250.0000 mg | ORAL_TABLET | Freq: Every day | ORAL | 0 refills | Status: DC

## 2018-05-15 MED ORDER — BENZONATATE 100 MG PO CAPS: 100.0000 mg | ORAL_CAPSULE | Freq: Three times a day (TID) | ORAL | 0 refills | Status: DC | PRN

## 2018-05-15 NOTE — ED Triage Notes (Signed)
Per [facility] pt come from home. Had SOB. Upon arrival pt anxious and SOB. Decreased Breathe sounds. Administered  125mg  solumedrol and 10mg  albuterol. 0.5mg  atrovent By ems. Vs 154/96 hr 66 [id number] spo2 100 10l.

## 2018-05-15 NOTE — ED Provider Notes (Signed)
[facility] EMERGENCY DEPARTMENT Provider Note   CSN: [id number] Arrival date & time: 05/15/18  1726     History   Chief Complaint Chief Complaint  Patient presents with  . Shortness of Breath    HPI Spencer Mendoza is a 57 y.o. male.  HPI Patient presents with a cough productive of yellow and green sputum for 1 week.  He has had thoracic back pain worse with deep breathing and coughing.  Denies any chest pain.  Also has had increased shortness of breath.  States he had subjective fevers and chills at home but no documented fever.  No new lower extremity swelling or pain.  No recent extended travel or immobilization.  Patient received 125 mg of Solu-Medrol and DuoNeb in route with minimal improvement of symptoms. Past Medical History:  Diagnosis Date  . Allergy   . Anxiety   . Bipolar disorder ([place])   . Chronic insomnia 06/06/2015  . Depression   . Epilepsy ([place])   . Hemiplegic migraine with status migrainosus 06/06/2015  . Hypertension   . Migraine   . Stroke [facility])    patient denies  . Substance abuse [facility])     Patient Active Problem List   Diagnosis Date Noted  . Major depressive disorder, recurrent episode, severe ([place]) 01/30/2018  . Substance induced mood disorder ([place]) 01/30/2018  . MDD (major depressive disorder), single episode, severe , no psychosis ([place]) 01/29/2018  . Hemiplegic migraine with status migrainosus 06/06/2015  . Chronic insomnia 06/06/2015  . Cocaine abuse ([place]) 05/22/2015  . Alcohol dependence with uncomplicated withdrawal ([place])   . Headache, migraine   . Migraines 11/29/2014  . Headache 08/08/2014  . Seizures ([place]) 01/09/2014  . Chronic low back pain 12/28/2013  . Hemiplegia, unspecified, affecting dominant side 12/06/2013  . CVA (cerebral infarction) 12/04/2013  . Leukocytosis 12/04/2013  . Acute Gastroenteritis 12/04/2013  . Hypertension 12/04/2013  . Hemiparesis ([place]) 12/04/2013  . Delirium tremens ([place]) 07/29/2013    . Alcohol withdrawal ([place]) 07/29/2013  . UGIB (upper gastrointestinal bleed) 07/29/2013  . Chest pain 07/28/2013    Past Surgical History:  Procedure Laterality Date  . APPENDECTOMY    . KNEE ARTHROSCOPY Right   . NOSE SURGERY          Home Medications    Prior to Admission medications   Medication Sig Start Date End Date Taking? Authorizing Provider  albuterol (PROVENTIL HFA;VENTOLIN HFA) 108 (90 Base) MCG/ACT inhaler Inhale 2 puffs into the lungs every 4 (four) hours as needed for shortness of breath or wheezing. 05/01/18  Yes [provider]  buprenorphine-naloxone (SUBOXONE) 8-2 mg SUBL SL tablet Place 1 tablet under the tongue every 8 (eight) hours. 05/12/18  Yes [provider]  cariprazine (VRAYLAR) capsule Take 3 mg by mouth at bedtime.   Yes [provider]  FLUoxetine (PROZAC) 20 MG capsule Take 1 capsule (20 mg total) by mouth daily. Patient taking differently: Take 40 mg by mouth daily.  02/07/18  Yes Rankin, Shuvon B, NP  levETIRAcetam (KEPPRA) 500 MG tablet Take 500 mg by mouth 2 (two) times daily.   Yes [provider]  promethazine (PHENERGAN) 25 MG tablet Take 25 mg by mouth every 6 (six) hours as needed for nausea or vomiting.   Yes [provider]  propranolol (INDERAL) 20 MG tablet Take 1 tablet (20 mg total) by mouth 2 (two) times daily. 02/06/18  Yes Rankin, Shuvon B, NP  SUMAtriptan (IMITREX) 50 MG tablet Take 50  mg by mouth daily as needed (MIGRAINES).    Yes [provider]  azithromycin (ZITHROMAX) 250 MG tablet Take 1 tablet (250 mg total) by mouth daily. Take first 2 tablets together, then 1 every day until finished. 05/15/18   Julianne Rice, MD  benzonatate (TESSALON) 100 MG capsule Take 1 capsule (100 mg total) by mouth 3 (three) times daily as needed for cough. 05/15/18   Julianne Rice, MD  gabapentin (NEURONTIN) 300 MG capsule Take 1 capsule (300 mg total) by mouth 2 (two) times daily. Patient not  taking: Reported on 05/15/2018 02/06/18   Rankin, Shuvon B, NP  hydrOXYzine (ATARAX/VISTARIL) 25 MG tablet Take 1 tablet (25 mg total) by mouth every 6 (six) hours as needed for anxiety. 05/15/18   Julianne Rice, MD  Multiple Vitamin (MULTIVITAMIN WITH MINERALS) TABS tablet Take 1 tablet by mouth daily. Patient not taking: Reported on 05/15/2018 02/07/18   Rankin, Shuvon B, NP  QUEtiapine (SEROQUEL) 300 MG tablet Take 1 tablet (300 mg total) by mouth at bedtime. Patient not taking: Reported on 05/15/2018 02/06/18   Rankin, Shuvon B, NP  QUEtiapine (SEROQUEL) 50 MG tablet Take 1 tablet (50 mg total) by mouth 2 (two) times daily. Patient not taking: Reported on 05/15/2018 02/06/18   Rankin, Shuvon B, NP  traZODone (DESYREL) 100 MG tablet Take 1 tablet (100 mg total) by mouth at bedtime. Patient not taking: Reported on 05/15/2018 02/06/18   Rankin, Delphia Grates B, NP    Family History Family History  Problem Relation Age of Onset  . Heart disease Mother   . Stroke Mother   . Epilepsy Mother   . Heart disease Father   . Hypertension Father   . Melanoma Father   . Migraines Father   . Colon cancer Sister   . Colon cancer Cousin     Social History Social History   Tobacco Use  . Smoking status: Current Every Day Smoker    Packs/day: 1.00    Years: 15.00    Pack years: 15.00    Types: Cigarettes  . Smokeless tobacco: Never Used  Substance Use Topics  . Alcohol use: Yes    Alcohol/week: 3.6 oz    Types: 6 Cans of beer per week    Comment: pt reports no ETOH and no durgs in 3 moths.   . Drug use: Yes    Frequency: 3.0 times per week    Types: Marijuana, Heroin, Cocaine    Comment: crack, heroin pt reports no ETOH and no durgs in 3 moths.      Allergies   Codeine; Depakote [divalproex sodium]; Migranal [dihydroergotamine]; and Topamax [topiramate]   Review of Systems Review of Systems  Constitutional: Positive for chills, fatigue and fever.  HENT: Positive for rhinorrhea and sore  throat.   Eyes: Negative for visual disturbance.  Respiratory: Positive for cough and shortness of breath.   Cardiovascular: Negative for chest pain, palpitations and leg swelling.  Gastrointestinal: Negative for abdominal pain, constipation, diarrhea, nausea and vomiting.  Genitourinary: Negative for dysuria, flank pain, frequency and hematuria.  Musculoskeletal: Positive for back pain and myalgias. Negative for neck pain and neck stiffness.  Skin: Negative for wound.  Neurological: Negative for dizziness, weakness, light-headedness, numbness and headaches.  All other systems reviewed and are negative.    Physical Exam Updated Vital Signs BP (!) 166/96   Pulse 77   Temp 98.4 F (36.9 C)   Resp 18   Ht 6' (1.829 m)   Wt 81.6 kg (180  lb)   SpO2 96%   BMI 24.41 kg/m   Physical Exam  Constitutional: He is oriented to person, place, and time. He appears well-developed and well-nourished. No distress.  HENT:  Head: Normocephalic and atraumatic.  Mouth/Throat: Oropharynx is clear and moist. No oropharyngeal exudate.  Eyes: Pupils are equal, round, and reactive to light. Conjunctivae and EOM are normal.  Neck: Normal range of motion. Neck supple. No JVD present.  Cardiovascular: Normal rate and regular rhythm. Exam reveals no gallop and no friction rub.  No murmur heard. Pulmonary/Chest: Effort normal.  Diminished breath sounds in the left base.  No respiratory distress.  Abdominal: Soft. Bowel sounds are normal. There is no tenderness. There is no rebound and no guarding.  Musculoskeletal: Normal range of motion. He exhibits no edema or tenderness.  No lower extremity swelling, asymmetry or tenderness.  Distal pulses intact.  No midline thoracic or lumbar tenderness.  No CVA tenderness.  Lymphadenopathy:    He has no cervical adenopathy.  Neurological: He is alert and oriented to person, place, and time.  Moving all extremities without focal deficit.  Sensation fully intact.    Skin: Skin is warm and dry. Capillary refill takes less than 2 seconds. No rash noted. He is not diaphoretic. No erythema.  Psychiatric: He has a normal mood and affect. His behavior is normal.  Nursing note and vitals reviewed.    ED Treatments / Results  Labs (all labs ordered are listed, but only abnormal results are displayed) Labs Reviewed  BASIC METABOLIC PANEL - Abnormal; Notable for the following components:      Result Value   Glucose, Bld 131 (*)    All other components within normal limits  CBC  D-DIMER, QUANTITATIVE (NOT AT [facility])    EKG EKG Interpretation  Date/Time:  Saturday May 15 2018 17:32:00 EDT Ventricular Rate:  65 PR Interval:    QRS Duration: 112 QT Interval:  427 QTC Calculation: 444 R Axis:   0 Text Interpretation:  Sinus rhythm Probable anteroseptal infarct, old Confirmed by Julianne Rice [phone]) on 05/15/2018 7:36:10 PM   Radiology Dg Chest 2 View  Result Date: 05/15/2018 CLINICAL DATA:  Shortness of breath. EXAM: CHEST - 2 VIEW COMPARISON:  01/25/2018 FINDINGS: Cardiomediastinal silhouette is normal. Mediastinal contours appear intact. There is no evidence of focal airspace consolidation, pleural effusion or pneumothorax. Osseous structures are without acute abnormality. Soft tissues are grossly normal. IMPRESSION: No active cardiopulmonary disease. Electronically Signed   By: Fidela Salisbury M.D.   On: 05/15/2018 18:14    Procedures Procedures (including critical care time)  Medications Ordered in ED Medications  hydrOXYzine (ATARAX/VISTARIL) tablet 25 mg (25 mg Oral Given 05/15/18 2000)     Initial Impression / Assessment and Plan / ED Course  I have reviewed the triage vital signs and the nursing notes.  Pertinent labs & imaging results that were available during my care of the patient were reviewed by me and considered in my medical decision making (see chart for details).    Attaining saturations in the mid to high 90s on  room air.  Chest x-ray without acute findings.  Normal d-dimer.  Normal white blood cell count.  Suspect bronchitis complicated by anxiety.  Return precautions given.   Final Clinical Impressions(s) / ED Diagnoses   Final diagnoses:  Bronchitis  Anxiety    ED Discharge Orders        Ordered    azithromycin (ZITHROMAX) 250 MG tablet  Daily  05/15/18 2146    hydrOXYzine (ATARAX/VISTARIL) 25 MG tablet  Every 6 hours PRN     05/15/18 2146    benzonatate (TESSALON) 100 MG capsule  3 times daily PRN     05/15/18 2146       Julianne Rice, MD 05/15/18 2151

## 2018-05-19 ENCOUNTER — Ambulatory Visit: Payer: Self-pay | Admitting: Urgent Care

## 2018-09-17 ENCOUNTER — Encounter: Payer: Self-pay | Admitting: Internal Medicine

## 2018-09-17 ENCOUNTER — Ambulatory Visit: Payer: Medicare Other | Attending: Internal Medicine | Admitting: Internal Medicine

## 2018-09-17 VITALS — BP 133/85 | HR 68 | Temp 98.0°F | Resp 16 | Ht 72.0 in | Wt 168.4 lb

## 2018-09-17 DIAGNOSIS — F332 Major depressive disorder, recurrent severe without psychotic features: Secondary | ICD-10-CM | POA: Insufficient documentation

## 2018-09-17 DIAGNOSIS — Z7689 Persons encountering health services in other specified circumstances: Secondary | ICD-10-CM | POA: Diagnosis present

## 2018-09-17 DIAGNOSIS — I10 Essential (primary) hypertension: Secondary | ICD-10-CM | POA: Diagnosis not present

## 2018-09-17 DIAGNOSIS — R45851 Suicidal ideations: Secondary | ICD-10-CM | POA: Diagnosis not present

## 2018-09-17 DIAGNOSIS — Z885 Allergy status to narcotic agent status: Secondary | ICD-10-CM | POA: Diagnosis not present

## 2018-09-17 DIAGNOSIS — G40909 Epilepsy, unspecified, not intractable, without status epilepticus: Secondary | ICD-10-CM | POA: Insufficient documentation

## 2018-09-17 DIAGNOSIS — Z8249 Family history of ischemic heart disease and other diseases of the circulatory system: Secondary | ICD-10-CM | POA: Insufficient documentation

## 2018-09-17 DIAGNOSIS — F17210 Nicotine dependence, cigarettes, uncomplicated: Secondary | ICD-10-CM | POA: Insufficient documentation

## 2018-09-17 DIAGNOSIS — F410 Panic disorder [episodic paroxysmal anxiety] without agoraphobia: Secondary | ICD-10-CM

## 2018-09-17 DIAGNOSIS — F1910 Other psychoactive substance abuse, uncomplicated: Secondary | ICD-10-CM | POA: Diagnosis not present

## 2018-09-17 DIAGNOSIS — Z79899 Other long term (current) drug therapy: Secondary | ICD-10-CM | POA: Insufficient documentation

## 2018-09-17 DIAGNOSIS — F1414 Cocaine abuse with cocaine-induced mood disorder: Secondary | ICD-10-CM | POA: Diagnosis not present

## 2018-09-17 DIAGNOSIS — G43909 Migraine, unspecified, not intractable, without status migrainosus: Secondary | ICD-10-CM | POA: Insufficient documentation

## 2018-09-17 DIAGNOSIS — IMO0002 Reserved for concepts with insufficient information to code with codable children: Secondary | ICD-10-CM

## 2018-09-17 DIAGNOSIS — Z2821 Immunization not carried out because of patient refusal: Secondary | ICD-10-CM | POA: Insufficient documentation

## 2018-09-17 DIAGNOSIS — G43709 Chronic migraine without aura, not intractable, without status migrainosus: Secondary | ICD-10-CM | POA: Diagnosis not present

## 2018-09-17 MED ORDER — LEVETIRACETAM 500 MG PO TABS: 500.0000 mg | ORAL_TABLET | Freq: Two times a day (BID) | ORAL | 2 refills | Status: DC

## 2018-09-17 NOTE — Progress Notes (Signed)
Patient ID: Spencer Mendoza, male    DOB: 01-Jun-1961  MRN: [id number]  CC: New Patient (Initial Visit) and Migraine   Subjective: Spencer Mendoza is a 57 y.o. male who presents for re-est care His concerns today include:  Hx of HTN, migraines, polysubst abuse, MDD, tob dep, anxiety disorder  Patient last seen here in 2016.  He was hospitalized in the summer of this year with suicidal ideation.  After that he moved to [place] to stay with his sister for a while and then went to [place] for 32-month rehabilitation program for his history of substance abuse. Patient states that he is currently on Symbicort, albuterol and Xanax 0.5 mg 3 times daily    He gives history of panic disorder and substance abuse.  He completed a 89-month rehab program in [place] and has been back in [place] for 2 months.  He has had a relapse with substance abuse.  He tells me that he uses heroin and Dilaudid.  He was on Suboxone in the past and would like to get back on that.  He has an appointment to establish with [facility] later this month.  He states that they do offer a Suboxone program there and will be seeing a psychiatrist/mental health specialist they are also.  Today he is requesting refill on Keppra and tramadol.  He gives history of grand mal seizures that started in childhood.  He has been off Keppra for 6 months.  He had a seizure about 2 weeks ago.   -He is requesting tramadol for chronic migraines.  He states that his neurologist had him on it about 3 years ago and it was the only thing that worked for him.  Gives history of hemiplegic migraine about 4 years ago.  Headaches are occipital and radiates into the neck.  Associated with photophobia, phonophobia, nausea and vomiting.  He has about 20 episodes a month.  Episodes last anywhere from 4 hours to several days.  He has Imitrex at home which he states does not help always even when he takes it at the start of the headache.   Reports being on propranolol and amitriptyline in the past for prophylaxis and did not find them helpful. -He was seeing Dr. Jannifer Franklin with [facility] neurology in the past for his migraines and seizure  Patient Active Problem List   Diagnosis Date Noted  . Major depressive disorder, recurrent episode, severe ([place]) 01/30/2018  . Substance induced mood disorder ([place]) 01/30/2018  . MDD (major depressive disorder), single episode, severe , no psychosis ([place]) 01/29/2018  . Hemiplegic migraine with status migrainosus 06/06/2015  . Chronic insomnia 06/06/2015  . Cocaine abuse ([place]) 05/22/2015  . Alcohol dependence with uncomplicated withdrawal ([place])   . Headache, migraine   . Migraines 11/29/2014  . Headache 08/08/2014  . Seizures ([place]) 01/09/2014  . Chronic low back pain 12/28/2013  . Hemiplegia, unspecified, affecting dominant side 12/06/2013  . CVA (cerebral infarction) 12/04/2013  . Leukocytosis 12/04/2013  . Acute Gastroenteritis 12/04/2013  . Hypertension 12/04/2013  . Hemiparesis ([place]) 12/04/2013  . Delirium tremens ([place]) 07/29/2013  . Alcohol withdrawal ([place]) 07/29/2013  . UGIB (upper gastrointestinal bleed) 07/29/2013  . Chest pain 07/28/2013     Current Outpatient Medications on File Prior to Visit  Medication Sig Dispense Refill  . albuterol (PROVENTIL HFA;VENTOLIN HFA) 108 (90 Base) MCG/ACT inhaler Inhale 2 puffs into the lungs every 4 (four) hours as needed for shortness of breath or wheezing.  0  .  ALPRAZolam (XANAX) 0.5 MG tablet Take 0.5 mg by mouth 3 (three) times daily.    . Multiple Vitamin (MULTIVITAMIN WITH MINERALS) TABS tablet Take 1 tablet by mouth daily. 30 tablet 0  . cariprazine (VRAYLAR) capsule Take 3 mg by mouth at bedtime.    [name] FLUoxetine (PROZAC) 20 MG capsule Take 1 capsule (20 mg total) by mouth daily. (Patient not taking: Reported on 09/17/2018) 30 capsule 0  . promethazine (PHENERGAN) 25 MG tablet Take 25 mg by mouth every 6 (six) hours as needed for nausea  or vomiting.    . propranolol (INDERAL) 20 MG tablet Take 1 tablet (20 mg total) by mouth 2 (two) times daily. (Patient not taking: Reported on 09/17/2018) 60 tablet 0  . SUMAtriptan (IMITREX) 50 MG tablet Take 50 mg by mouth daily as needed (MIGRAINES).      No current facility-administered medications on file prior to visit.     Allergies  Allergen Reactions  . Codeine Nausea And Vomiting  . Depakote [Divalproex Sodium] Other (See Comments)    Made patient shake  . Migranal [Dihydroergotamine] Nausea And Vomiting  . Topamax [Topiramate] Other (See Comments)    Made patient angry    Social History   Socioeconomic History  . Marital status: Divorced    Spouse name: Not on file  . Number of children: 0  . Years of education: GED  . Highest education level: Not on file  Occupational History  . Occupation: disabled  Social Needs  . Financial resource strain: Not on file  . Food insecurity:    Worry: Not on file    Inability: Not on file  . Transportation needs:    Medical: Not on file    Non-medical: Not on file  Tobacco Use  . Smoking status: Current Every Day Smoker    Packs/day: 1.00    Years: 15.00    Pack years: 15.00    Types: Cigarettes  . Smokeless tobacco: Never Used  Substance and Sexual Activity  . Alcohol use: Yes    Alcohol/week: 6.0 standard drinks    Types: 6 Cans of beer per week    Comment: pt reports no ETOH and no durgs in 3 moths.   . Drug use: Yes    Frequency: 3.0 times per week    Types: Marijuana, Heroin, Cocaine    Comment: crack, heroin pt reports no ETOH and no durgs in 3 moths.   . Sexual activity: Yes    Birth control/protection: Condom  Lifestyle  . Physical activity:    Days per week: Not on file    Minutes per session: Not on file  . Stress: Not on file  Relationships  . Social connections:    Talks on phone: Not on file    Gets together: Not on file    Attends religious service: Not on file    Active member of club or  organization: Not on file    Attends meetings of clubs or organizations: Not on file    Relationship status: Not on file  . Intimate partner violence:    Fear of current or ex partner: Not on file    Emotionally abused: Not on file    Physically abused: Not on file    Forced sexual activity: Not on file  Other Topics Concern  . Not on file  Social History Narrative   Pt lives with significant other.    Patient drinks 2 cups of caffeine daily.   Patient is right  handed.    Family History  Problem Relation Age of Onset  . Heart disease Mother   . Stroke Mother   . Epilepsy Mother   . Heart disease Father   . Hypertension Father   . Melanoma Father   . Migraines Father   . Colon cancer Sister   . Colon cancer Cousin     Past Surgical History:  Procedure Laterality Date  . APPENDECTOMY    . KNEE ARTHROSCOPY Right   . NOSE SURGERY      ROS: Review of Systems Negative except as above PHYSICAL EXAM: BP 133/85   Pulse 68   Temp 98 F (36.7 C) (Oral)   Resp 16   Ht 6' (1.829 m)   Wt 168 lb 6.4 oz (76.4 kg)   SpO2 100%   BMI 22.84 kg/m   Physical Exam  General appearance - alert, well appearing, middle-aged Caucasian male and in no distress Mental status -flat affect  Chest - clear to auscultation, no wheezes, rales or rhonchi, symmetric air entry Heart - normal rate, regular rhythm, normal S1, S2, no murmurs, rubs, clicks or gallops Neurological - cranial nerves II through XII intact, motor and sensory grossly normal bilaterally  ASSESSMENT AND PLAN: 1. Seizure disorder (HCC) - levETIRAcetam (KEPPRA) 500 MG tablet; Take 1 tablet (500 mg total) by mouth 2 (two) times daily.  Dispense: 60 tablet; Refill: 2 - Ambulatory referral to Neurology  2. Polysubstance abuse ([place]) Patient to keep his appointment that scheduled later this month at [facility]  3. Chronic migraine Discussed trying him with Topamax for migraine prophylaxis.  Patient declined.  He is not  interested in trying anything else for his migraines except for tramadol.  I told him that I am not willing to prescribe tramadol for chronic migraines as it is not recommended.  Will refer him to neurology - Ambulatory referral to Neurology  4. Influenza vaccination declined   5. Panic disorder Encourage him to establish with behavioral health as I will not be prescribing Xanax for him.  Patient states that he currently has refills until January and plans to get establish prior to that     Patient was given the opportunity to ask questions.  Patient verbalized understanding of the plan and was able to repeat key elements of the plan.   Orders Placed This Encounter  Procedures  . Ambulatory referral to Neurology     Requested Prescriptions   Signed Prescriptions Disp Refills  . levETIRAcetam (KEPPRA) 500 MG tablet 60 tablet 2    Sig: Take 1 tablet (500 mg total) by mouth 2 (two) times daily.    Return if symptoms worsen or fail to improve.  Karle Plumber, MD, FACP

## 2018-11-18 ENCOUNTER — Emergency Department (HOSPITAL_COMMUNITY)
Admission: EM | Admit: 2018-11-18 | Discharge: 2018-11-18 | Disposition: A | Discharge status: Other Acute Inpatient Hospital | Payer: Medicare HMO | Attending: Emergency Medicine | Admitting: Emergency Medicine

## 2018-11-18 ENCOUNTER — Other Ambulatory Visit: Payer: Self-pay

## 2018-11-18 ENCOUNTER — Inpatient Hospital Stay (HOSPITAL_COMMUNITY)
Admission: AD | Admit: 2018-11-18 | Discharge: 2018-11-22 | DRG: 885 | Disposition: A | Discharge status: Home / Self Care | Payer: Medicare HMO | Source: Intra-hospital | Attending: Psychiatry | Admitting: Psychiatry

## 2018-11-18 ENCOUNTER — Encounter (HOSPITAL_COMMUNITY): Payer: Self-pay

## 2018-11-18 DIAGNOSIS — Z808 Family history of malignant neoplasm of other organs or systems: Secondary | ICD-10-CM

## 2018-11-18 DIAGNOSIS — Z823 Family history of stroke: Secondary | ICD-10-CM | POA: Diagnosis not present

## 2018-11-18 DIAGNOSIS — Z8673 Personal history of transient ischemic attack (TIA), and cerebral infarction without residual deficits: Secondary | ICD-10-CM | POA: Diagnosis not present

## 2018-11-18 DIAGNOSIS — G43909 Migraine, unspecified, not intractable, without status migrainosus: Secondary | ICD-10-CM | POA: Diagnosis present

## 2018-11-18 DIAGNOSIS — Z8249 Family history of ischemic heart disease and other diseases of the circulatory system: Secondary | ICD-10-CM | POA: Diagnosis not present

## 2018-11-18 DIAGNOSIS — F1910 Other psychoactive substance abuse, uncomplicated: Secondary | ICD-10-CM

## 2018-11-18 DIAGNOSIS — Z79899 Other long term (current) drug therapy: Secondary | ICD-10-CM | POA: Insufficient documentation

## 2018-11-18 DIAGNOSIS — R45851 Suicidal ideations: Secondary | ICD-10-CM | POA: Diagnosis present

## 2018-11-18 DIAGNOSIS — F1299 Cannabis use, unspecified with unspecified cannabis-induced disorder: Secondary | ICD-10-CM | POA: Diagnosis not present

## 2018-11-18 DIAGNOSIS — F3131 Bipolar disorder, current episode depressed, mild: Secondary | ICD-10-CM | POA: Diagnosis not present

## 2018-11-18 DIAGNOSIS — G4700 Insomnia, unspecified: Secondary | ICD-10-CM | POA: Diagnosis not present

## 2018-11-18 DIAGNOSIS — F17210 Nicotine dependence, cigarettes, uncomplicated: Secondary | ICD-10-CM | POA: Diagnosis not present

## 2018-11-18 DIAGNOSIS — Z820 Family history of epilepsy and other diseases of the nervous system: Secondary | ICD-10-CM

## 2018-11-18 DIAGNOSIS — F419 Anxiety disorder, unspecified: Secondary | ICD-10-CM | POA: Diagnosis not present

## 2018-11-18 DIAGNOSIS — Z800 Family history of malignant neoplasm of digestive organs: Secondary | ICD-10-CM

## 2018-11-18 DIAGNOSIS — I10 Essential (primary) hypertension: Secondary | ICD-10-CM | POA: Diagnosis not present

## 2018-11-18 DIAGNOSIS — F329 Major depressive disorder, single episode, unspecified: Secondary | ICD-10-CM | POA: Diagnosis present

## 2018-11-18 DIAGNOSIS — F332 Major depressive disorder, recurrent severe without psychotic features: Secondary | ICD-10-CM | POA: Diagnosis not present

## 2018-11-18 DIAGNOSIS — F1010 Alcohol abuse, uncomplicated: Secondary | ICD-10-CM | POA: Diagnosis not present

## 2018-11-18 LAB — CBC
HCT: 46 % (ref 39.0–52.0)
Hemoglobin: 15.6 g/dL (ref 13.0–17.0)
MCH: 32.4 pg (ref 26.0–34.0)
MCHC: 33.9 g/dL (ref 30.0–36.0)
MCV: 95.4 fL (ref 80.0–100.0)
Platelets: 259 10*3/uL (ref 150–400)
RBC: 4.82 MIL/uL (ref 4.22–5.81)
RDW: 12.6 % (ref 11.5–15.5)
WBC: 8.8 10*3/uL (ref 4.0–10.5)
nRBC: 0 % (ref 0.0–0.2)

## 2018-11-18 LAB — COMPREHENSIVE METABOLIC PANEL
ALT: 20 U/L (ref 0–44)
AST: 24 U/L (ref 15–41)
Albumin: 4.5 g/dL (ref 3.5–5.0)
Alkaline Phosphatase: 81 U/L (ref 38–126)
Anion gap: 14 (ref 5–15)
BUN: 12 mg/dL (ref 6–20)
CO2: 21 mmol/L — ABNORMAL LOW (ref 22–32)
Calcium: 9.3 mg/dL (ref 8.9–10.3)
Chloride: 104 mmol/L (ref 98–111)
Creatinine, Ser: 0.94 mg/dL (ref 0.61–1.24)
GFR calc Af Amer: >60 mL/min (ref >60)
GFR calc non Af Amer: >60 mL/min (ref >60)
Glucose, Bld: 82 mg/dL (ref 70–99)
Potassium: 3.7 mmol/L (ref 3.5–5.1)
Sodium: 139 mmol/L (ref 135–145)
Total Bilirubin: 0.5 mg/dL (ref 0.3–1.2)
Total Protein: 7.3 g/dL (ref 6.5–8.1)

## 2018-11-18 LAB — ETHANOL: Alcohol, Ethyl (B): 81 mg/dL — ABNORMAL HIGH (ref <10)

## 2018-11-18 LAB — RAPID URINE DRUG SCREEN, HOSP PERFORMED
Amphetamines: NOT DETECTED
Barbiturates: NOT DETECTED
Benzodiazepines: POSITIVE — AB
Cocaine: POSITIVE — AB
Opiates: NOT DETECTED
Tetrahydrocannabinol: POSITIVE — AB

## 2018-11-18 LAB — ACETAMINOPHEN LEVEL: Acetaminophen (Tylenol), Serum: <10 ug/mL — ABNORMAL LOW (ref 10–30)

## 2018-11-18 LAB — SALICYLATE LEVEL: Salicylate Lvl: <7 mg/dL (ref 2.8–30.0)

## 2018-11-18 MED ORDER — HYDROXYZINE HCL 25 MG PO TABS
25.0000 mg | ORAL_TABLET | Freq: Four times a day (QID) | ORAL | Status: AC | PRN
  Administered 2018-11-19 – 2018-11-20 (×2): 25 mg via ORAL
  Filled 2018-11-18 (×2): qty 1

## 2018-11-18 MED ORDER — KETOROLAC TROMETHAMINE 30 MG/ML IJ SOLN
30.0000 mg | Freq: Once | INTRAMUSCULAR | Status: AC
  Administered 2018-11-18: 30 mg via INTRAVENOUS
  Filled 2018-11-18 (×2): qty 1

## 2018-11-18 MED ORDER — TRAZODONE HCL 50 MG PO TABS: 50.0000 mg | ORAL_TABLET | Freq: Every evening | ORAL | Status: DC | PRN

## 2018-11-18 MED ORDER — ONDANSETRON 4 MG PO TBDP: 4.0000 mg | ORAL_TABLET | Freq: Four times a day (QID) | ORAL | Status: AC | PRN

## 2018-11-18 MED ORDER — CHLORDIAZEPOXIDE HCL 25 MG PO CAPS: 25.0000 mg | ORAL_CAPSULE | Freq: Four times a day (QID) | ORAL | Status: DC

## 2018-11-18 MED ORDER — CHLORDIAZEPOXIDE HCL 25 MG PO CAPS: 25.0000 mg | ORAL_CAPSULE | Freq: Every day | ORAL | Status: DC

## 2018-11-18 MED ORDER — ACETAMINOPHEN 325 MG PO TABS
650.0000 mg | ORAL_TABLET | Freq: Four times a day (QID) | ORAL | Status: DC | PRN
  Administered 2018-11-18 – 2018-11-19 (×3): 650 mg via ORAL
  Filled 2018-11-18 (×3): qty 2

## 2018-11-18 MED ORDER — GABAPENTIN 300 MG PO CAPS
300.0000 mg | ORAL_CAPSULE | Freq: Three times a day (TID) | ORAL | Status: DC
  Administered 2018-11-18 – 2018-11-22 (×13): 300 mg via ORAL
  Filled 2018-11-18 (×19): qty 1

## 2018-11-18 MED ORDER — ADULT MULTIVITAMIN W/MINERALS CH
1.0000 | ORAL_TABLET | Freq: Every day | ORAL | Status: DC
  Administered 2018-11-18 – 2018-11-22 (×5): 1 via ORAL
  Filled 2018-11-18 (×9): qty 1

## 2018-11-18 MED ORDER — LEVETIRACETAM 500 MG PO TABS
500.0000 mg | ORAL_TABLET | Freq: Two times a day (BID) | ORAL | Status: DC
  Administered 2018-11-18 – 2018-11-22 (×8): 500 mg via ORAL
  Filled 2018-11-18 (×13): qty 1

## 2018-11-18 MED ORDER — ADULT MULTIVITAMIN W/MINERALS CH: 1.0000 | ORAL_TABLET | Freq: Every day | ORAL | Status: DC

## 2018-11-18 MED ORDER — ACETAMINOPHEN 325 MG PO TABS
650.0000 mg | ORAL_TABLET | Freq: Once | ORAL | Status: AC
  Administered 2018-11-18: 650 mg via ORAL
  Filled 2018-11-18: qty 2

## 2018-11-18 MED ORDER — HYDROXYZINE HCL 50 MG PO TABS: 50.0000 mg | ORAL_TABLET | Freq: Three times a day (TID) | ORAL | Status: DC | PRN

## 2018-11-18 MED ORDER — FLUOXETINE HCL 20 MG PO CAPS
20.0000 mg | ORAL_CAPSULE | Freq: Every day | ORAL | Status: DC
  Administered 2018-11-18 – 2018-11-21 (×4): 20 mg via ORAL
  Filled 2018-11-18 (×7): qty 1

## 2018-11-18 MED ORDER — ALPRAZOLAM 0.5 MG PO TABS: 0.5000 mg | ORAL_TABLET | Freq: Three times a day (TID) | ORAL | Status: DC

## 2018-11-18 MED ORDER — QUETIAPINE FUMARATE 200 MG PO TABS: 200.0000 mg | ORAL_TABLET | Freq: Every day | ORAL | Status: DC

## 2018-11-18 MED ORDER — LOPERAMIDE HCL 2 MG PO CAPS: 2.0000 mg | ORAL_CAPSULE | ORAL | Status: AC | PRN

## 2018-11-18 MED ORDER — CHLORDIAZEPOXIDE HCL 25 MG PO CAPS: 25.0000 mg | ORAL_CAPSULE | Freq: Three times a day (TID) | ORAL | Status: DC

## 2018-11-18 MED ORDER — CHLORDIAZEPOXIDE HCL 25 MG PO CAPS
25.0000 mg | ORAL_CAPSULE | Freq: Four times a day (QID) | ORAL | Status: AC | PRN
  Administered 2018-11-18 – 2018-11-19 (×4): 25 mg via ORAL
  Filled 2018-11-18 (×3): qty 1

## 2018-11-18 MED ORDER — LEVETIRACETAM 500 MG PO TABS
500.0000 mg | ORAL_TABLET | Freq: Two times a day (BID) | ORAL | Status: DC
  Administered 2018-11-18: 500 mg via ORAL
  Filled 2018-11-18: qty 1

## 2018-11-18 MED ORDER — ALUM & MAG HYDROXIDE-SIMETH 200-200-20 MG/5ML PO SUSP: 30.0000 mL | ORAL | Status: DC | PRN

## 2018-11-18 MED ORDER — TRAZODONE HCL 150 MG PO TABS: 150.0000 mg | ORAL_TABLET | Freq: Every day | ORAL | Status: DC

## 2018-11-18 MED ORDER — THIAMINE HCL 100 MG/ML IJ SOLN: 100.0000 mg | Freq: Once | INTRAMUSCULAR | Status: DC

## 2018-11-18 MED ORDER — VITAMIN B-1 100 MG PO TABS
100.0000 mg | ORAL_TABLET | Freq: Every day | ORAL | Status: DC
  Administered 2018-11-19 – 2018-11-22 (×4): 100 mg via ORAL
  Filled 2018-11-18 (×6): qty 1

## 2018-11-18 MED ORDER — MAGNESIUM HYDROXIDE 400 MG/5ML PO SUSP
30.0000 mL | Freq: Every day | ORAL | Status: DC | PRN
  Administered 2018-11-19 – 2018-11-20 (×2): 30 mL via ORAL
  Filled 2018-11-18 (×2): qty 30

## 2018-11-18 MED ORDER — ALBUTEROL SULFATE HFA 108 (90 BASE) MCG/ACT IN AERS: 2.0000 | INHALATION_SPRAY | RESPIRATORY_TRACT | Status: DC | PRN

## 2018-11-18 MED ORDER — FOLIC ACID 1 MG PO TABS
1.0000 mg | ORAL_TABLET | Freq: Every day | ORAL | Status: DC
  Administered 2018-11-18 – 2018-11-22 (×5): 1 mg via ORAL
  Filled 2018-11-18 (×8): qty 1

## 2018-11-18 MED ORDER — CHLORDIAZEPOXIDE HCL 25 MG PO CAPS
25.0000 mg | ORAL_CAPSULE | Freq: Once | ORAL | Status: AC
  Filled 2018-11-18: qty 1

## 2018-11-18 MED ORDER — CHLORDIAZEPOXIDE HCL 25 MG PO CAPS: 25.0000 mg | ORAL_CAPSULE | ORAL | Status: DC

## 2018-11-18 MED ORDER — LEVETIRACETAM 500 MG PO TABS
500.0000 mg | ORAL_TABLET | Freq: Two times a day (BID) | ORAL | Status: DC
  Filled 2018-11-18 (×5): qty 1

## 2018-11-18 MED ORDER — NICOTINE 21 MG/24HR TD PT24
21.0000 mg | MEDICATED_PATCH | Freq: Every day | TRANSDERMAL | Status: DC
  Administered 2018-11-18 – 2018-11-21 (×4): 21 mg via TRANSDERMAL
  Filled 2018-11-18 (×8): qty 1

## 2018-11-18 MED ORDER — TRAZODONE HCL 100 MG PO TABS
100.0000 mg | ORAL_TABLET | Freq: Every evening | ORAL | Status: DC | PRN
  Administered 2018-11-18 – 2018-11-21 (×4): 100 mg via ORAL
  Filled 2018-11-18 (×3): qty 1

## 2018-11-18 MED ORDER — ENSURE ENLIVE PO LIQD
237.0000 mL | Freq: Two times a day (BID) | ORAL | Status: DC
  Administered 2018-11-18: 237 mL via ORAL

## 2018-11-18 MED ORDER — QUETIAPINE FUMARATE 50 MG PO TABS: 50.0000 mg | ORAL_TABLET | Freq: Two times a day (BID) | ORAL | Status: DC

## 2018-11-18 NOTE — H&P (Signed)
Psychiatric Admission Assessment Adult  Patient Identification: Spencer Mendoza MRN:  [id number] Date of Evaluation:  11/18/2018 Chief Complaint:  BIPOLAR DISORDER; DEPRESSED Principal Diagnosis: Suicidal thinking in the context of alcohol dependence/polysubstance abuse/cannabis dependency/chronic complaints of migraine headaches, and history of being diagnosed with bipolar type symptomatology. Diagnosis:  Active Problems:   MDD (major depressive disorder), recurrent severe, without psychosis ([place])  History of Present Illness:   Spencer Mendoza is a 58 year old patient who presented on a voluntary basis through the emergency department at 1 AM today, reporting that his migraines are so severe that it is prompted him to have suicidal thinking and plans, stating that when he got his disability check tomorrow on the third he would obtain enough heroin to overdose.  He presented in March 2019 with a nearly identical presentation, stating he would overdose on $700 worth of heroin and had recently abused an excessive amount of cocaine as well in an attempt to end his life.  He had actually been discharged from a rehab facility [facility] [place] and that was in February 2019 but relapsed immediately causing further stress within his family who would pay thousands of dollars for his rehab.    He was detoxed successfully on that admission in March and prescribed at discharge his Keppra, multivitamins, propranolol, fluoxetine, Imitrex and gabapentin he cannot tell me how long he stayed sober.  What is known is he did present on this admission with a drug screen positive for benzodiazepines, cocaine, cannabis and a blood alcohol level of 81 stating he does need detox measures.  Does not know where the benzodiazepines came from.  When I interviewed the patient he has very guarded he makes no eye contact states he has a migraine and cannot talk but tells me an identical story that he is  hoping to overdose when he gets his disability check because he cannot take the pain anymore and no medications help, when I listen numerous medications that are used for acute and prophylactic migraine therapy states "that does not work" to every medication even fictitious medications.  Despite his despondency and inability to acknowledge any medication will be helpful, does contract for safety while here.  Denies auditory visual loose [place] and denies thoughts of harming others  Spencer Mendoza's behavioral health assessment reads as follows Spencer Mendoza is an 58 y.o. male who presents to the ED voluntarily. Pt reports he has been increasingly depressed and suicidal and has a plan to OD on heroin. Pt states he gets paid from disability on 11/19/18 and he plans to use his check to purchase a large enough amount of heroin to OD. Pt states he is suicidal due to constant migraine headaches. Pt states the pain is unbearable and he has no desire to live. Pt states he has been depressed for years and using substances to cope. Pt states he uses cannabis because that is the only thing that relieves his migraine. Pt states he has been admitted to multiple inpt facilities due to depression, SI, and substance abuse. Pt is tearful and slumped over during the assessment. Pt states "I can't take this anymore. I just feel so sick all the time. Nothing ever helps. I can't do this." Pt is unable to contract for safety.   Associated Signs/Symptoms: Depression Symptoms:  psychomotor retardation, fatigue, (Hypo) Manic Symptoms:  Irritable Mood, Anxiety Symptoms:  Excessive Worry, Psychotic Symptoms:  n/a PTSD Symptoms: NA Total Time spent with patient: 45 minutes  Past Psychiatric  History: Extensive psychiatric and chemical dependency history discussed  Is the patient at risk to self? Yes.    Has the patient been a risk to self in the past 6 months? Yes.    Has the patient been a risk to self within the distant  past? Yes.    Is the patient a risk to others? No.  Has the patient been a risk to others in the past 6 months? No.  Has the patient been a risk to others within the distant past? No.   Alcohol Screening: Patient refused Alcohol Screening Tool: Yes 1. How often do you have a drink containing alcohol?: 2 to 3 times a week 2. How many drinks containing alcohol do you have on a typical day when you are drinking?: 5 or 6 3. How often do you have six or more drinks on one occasion?: Monthly AUDIT-C Score: 7 4. How often during the last year have you found that you were not able to stop drinking once you had started?: Monthly 5. How often during the last year have you failed to do what was normally expected from you becasue of drinking?: Monthly 6. How often during the last year have you needed a first drink in the morning to get yourself going after a heavy drinking session?: Monthly 7. How often during the last year have you had a feeling of guilt of remorse after drinking?: Monthly 8. How often during the last year have you been unable to remember what happened the night before because you had been drinking?: Monthly 9. Have you or someone else been injured as a result of your drinking?: No 10. Has a relative or friend or a doctor or another health worker been concerned about your drinking or suggested you cut down?: Yes, during the last year Alcohol Use Disorder Identification Test Final Score (AUDIT): 21 Intervention/Follow-up: Patient Refused Substance Abuse History in the last 12 months:  Yes.   Consequences of Substance Abuse: Medical Consequences:  Limiting treatment regimens if he does indeed have true migraines Previous Psychotropic Medications: Yes  Psychological Evaluations: No  Past Medical History:  Past Medical History:  Diagnosis Date  . Allergy   . Anxiety   . Bipolar disorder ([place])   . Chronic insomnia 06/06/2015  . Depression   . Epilepsy ([place])   . Hemiplegic migraine  with status migrainosus 06/06/2015  . Hypertension   . Migraine   . Stroke [facility])    patient denies  . Substance abuse [facility])     Past Surgical History:  Procedure Laterality Date  . APPENDECTOMY    . KNEE ARTHROSCOPY Right   . NOSE SURGERY     Family History:  Family History  Problem Relation Age of Onset  . Heart disease Mother   . Stroke Mother   . Epilepsy Mother   . Heart disease Father   . Hypertension Father   . Melanoma Father   . Migraines Father   . Colon cancer Sister   . Colon cancer Cousin    Family Psychiatric  History: Did not elaborate Tobacco Screening: Have you used any form of tobacco in the last 30 days? (Cigarettes, Smokeless Tobacco, Cigars, and/or Pipes): Yes Tobacco use, Select all that apply: 5 or more cigarettes per day Are you interested in Tobacco Cessation Medications?: Yes, will notify MD for an order Counseled patient on smoking cessation including recognizing danger situations, developing coping skills and basic information about quitting provided: Refused/Declined practical counseling Social History:  Social History   Substance and Sexual Activity  Alcohol Use Yes  . Alcohol/week: 6.0 standard drinks  . Types: 6 Cans of beer per week   Comment: pt reports no ETOH and no durgs in 3 moths.      Social History   Substance and Sexual Activity  Drug Use Yes  . Frequency: 3.0 times per week  . Types: Marijuana, Heroin, Cocaine   Comment: crack, heroin pt reports no ETOH and no durgs in 3 moths.     Additional Social History: States he shares a home with his sister and is welcome back there Allergies:   Allergies  Allergen Reactions  . Codeine Nausea And Vomiting  . Depakote [Divalproex Sodium] Other (See Comments)    Made patient shake  . Migranal [Dihydroergotamine] Nausea And Vomiting  . Topamax [Topiramate] Other (See Comments)    Made patient angry   Lab Results:  Results for orders placed or performed during the hospital  encounter of 11/18/18 (from the past 48 hour(s))  Comprehensive metabolic panel     Status: Abnormal   Collection Time: 11/18/18  2:46 AM  Result Value Ref Range   Sodium 139 135 - 145 mmol/L   Potassium 3.7 3.5 - 5.1 mmol/L   Chloride 104 98 - 111 mmol/L   CO2 21 (L) 22 - 32 mmol/L   Glucose, Bld 82 70 - 99 mg/dL   BUN 12 6 - 20 mg/dL   Creatinine, Ser 0.94 0.61 - 1.24 mg/dL   Calcium 9.3 8.9 - 10.3 mg/dL   Total Protein 7.3 6.5 - 8.1 g/dL   Albumin 4.5 3.5 - 5.0 g/dL   AST 24 15 - 41 U/L   ALT 20 0 - 44 U/L   Alkaline Phosphatase 81 38 - 126 U/L   Total Bilirubin 0.5 0.3 - 1.2 mg/dL   GFR calc non Af Amer >60 >60 mL/min   GFR calc Af Amer >60 >60 mL/min   Anion gap 14 5 - 15    Comment: Performed at [facility], [place] [address], [place], [place] [address]  Ethanol     Status: Abnormal   Collection Time: 11/18/18  2:46 AM  Result Value Ref Range   Alcohol, Ethyl (B) 81 (H) <10 mg/dL    Comment: (NOTE) Lowest detectable limit for serum alcohol is 10 mg/dL. For medical purposes only. Performed at [facility], [place] [address], [place], [place] [address]   Salicylate level     Status: None   Collection Time: 11/18/18  2:46 AM  Result Value Ref Range   Salicylate Lvl <8.6 2.8 - 30.0 mg/dL    Comment: Performed at [facility], [place] [address], [place], [place] [address]  Acetaminophen level     Status: Abnormal   Collection Time: 11/18/18  2:46 AM  Result Value Ref Range   Acetaminophen (Tylenol), Serum <10 (L) 10 - 30 ug/mL    Comment: (NOTE) Therapeutic concentrations vary significantly. A range of 10-30 ug/mL  may be an effective concentration for many patients. However, some  are best treated at concentrations outside of this range. Acetaminophen concentrations >150 ug/mL at 4 hours after ingestion  and >50 ug/mL at 12 hours after ingestion are often associated with  toxic reactions. Performed at [facility], [place] [address], [place],  [address]   cbc     Status: None   Collection Time: 11/18/18  2:46 AM  Result Value Ref Range  WBC 8.8 4.0 - 10.5 K/uL   RBC 4.82 4.22 - 5.81 MIL/uL   Hemoglobin 15.6 13.0 - 17.0 g/dL   HCT 46.0 39.0 - 52.0 %   MCV 95.4 80.0 - 100.0 fL   MCH 32.4 26.0 - 34.0 pg   MCHC 33.9 30.0 - 36.0 g/dL   RDW 12.6 11.5 - 15.5 %   Platelets 259 150 - 400 K/uL   nRBC 0.0 0.0 - 0.2 %    Comment: Performed at [facility], [place] [address], [place], [place] [address]  Rapid urine drug screen (hospital performed)     Status: Abnormal   Collection Time: 11/18/18  5:18 AM  Result Value Ref Range   Opiates NONE DETECTED NONE DETECTED   Cocaine POSITIVE (A) NONE DETECTED   Benzodiazepines POSITIVE (A) NONE DETECTED   Amphetamines NONE DETECTED NONE DETECTED   Tetrahydrocannabinol POSITIVE (A) NONE DETECTED   Barbiturates NONE DETECTED NONE DETECTED    Comment: (NOTE) DRUG SCREEN FOR MEDICAL PURPOSES ONLY.  IF CONFIRMATION IS NEEDED FOR ANY PURPOSE, NOTIFY LAB WITHIN 5 DAYS. LOWEST DETECTABLE LIMITS FOR URINE DRUG SCREEN Drug Class                     Cutoff (ng/mL) Amphetamine and metabolites    1000 Barbiturate and metabolites    200 Benzodiazepine                 660 Tricyclics and metabolites     300 Opiates and metabolites        300 Cocaine and metabolites        300 THC                            50 Performed at [facility], [place] [address], [place], [place] [address]     Blood Alcohol level:  Lab Results  Component Value Date   ETH 81 (H) 11/18/2018   ETH <10 11/25/3233    Metabolic Disorder Labs:  Lab Results  Component Value Date   HGBA1C 4.9 02/01/2018   MPG 93.93 02/01/2018   MPG 97 12/05/2013   Lab Results  Component Value Date   PROLACTIN 15.2 02/01/2018   Lab Results  Component Value Date   CHOL 199 02/01/2018   TRIG 229 (H) 02/01/2018   HDL 55 02/01/2018   CHOLHDL  3.6 02/01/2018   VLDL 46 (H) 02/01/2018   LDLCALC 98 02/01/2018   LDLCALC 62 12/05/2013    Current Medications: Current Facility-Administered Medications  Medication Dose Route Frequency Provider Last Rate Last Dose  . acetaminophen (TYLENOL) tablet 650 mg  650 mg Oral Q6H PRN Ethelene Hal, NP      . albuterol (PROVENTIL HFA;VENTOLIN HFA) 108 (90 Base) MCG/ACT inhaler 2 puff  2 puff Inhalation Q4H PRN Ethelene Hal, NP      . ALPRAZolam Duanne Moron) tablet 0.5 mg  0.5 mg Oral TID Ethelene Hal, NP      . alum & mag hydroxide-simeth (MAALOX/MYLANTA) 200-200-20 MG/5ML suspension 30 mL  30 mL Oral Q4H PRN Ethelene Hal, NP      . feeding supplement (ENSURE ENLIVE) (ENSURE ENLIVE) liquid 237 mL  237 mL Oral BID BM Sharma Covert, MD      . hydrOXYzine (ATARAX/VISTARIL) tablet 50 mg  50 mg Oral TID PRN Ethelene Hal, NP      . levETIRAcetam (KEPPRA) tablet 500 mg  500 mg Oral BID Ethelene Hal, NP      . magnesium hydroxide (MILK OF MAGNESIA) suspension 30 mL  30 mL Oral Daily PRN Ethelene Hal, NP      . nicotine (NICODERM CQ - dosed in mg/24 hours) patch 21 mg  21 mg Transdermal Daily Sharma Covert, MD      . traZODone (DESYREL) tablet 50 mg  50 mg Oral QHS PRN Ethelene Hal, NP       PTA Medications: Medications Prior to Admission  Medication Sig Dispense Refill Last Dose  . albuterol (PROVENTIL HFA;VENTOLIN HFA) 108 (90 Base) MCG/ACT inhaler Inhale 2 puffs into the lungs every 4 (four) hours as needed for shortness of breath or wheezing.  0 Past Month at Unknown time  . ALPRAZolam (XANAX) 0.5 MG tablet Take 0.5 mg by mouth 3 (three) times daily.   11/17/2018 at Unknown time  . FLUoxetine (PROZAC) 20 MG capsule Take 1 capsule (20 mg total) by mouth daily. (Patient not taking: Reported on 09/17/2018) 30 capsule 0 Not Taking  . levETIRAcetam (KEPPRA) 500 MG tablet Take 1 tablet (500 mg total) by mouth 2 (two) times daily. 60  tablet 2 Past Month at Unknown time  . Multiple Vitamin (MULTIVITAMIN WITH MINERALS) TABS tablet Take 1 tablet by mouth daily. (Patient not taking: Reported on 11/18/2018) 30 tablet 0 Not Taking at Unknown time  . propranolol (INDERAL) 20 MG tablet Take 1 tablet (20 mg total) by mouth 2 (two) times daily. (Patient not taking: Reported on 09/17/2018) 60 tablet 0 Not Taking    Musculoskeletal: Strength & Muscle Tone: decreased Gait & Station: unable to stand Patient leans: N/A  Psychiatric Specialty Exam: Physical Exam  ROS  Blood pressure 101/81, pulse 76, temperature 98.4 F (36.9 C), temperature source Oral, resp. rate 16, height 6' (1.829 m), weight 71.7 kg, SpO2 98 %.Body mass index is 21.43 kg/m.  General Appearance: Disheveled  Eye Contact:  None  Speech:  Slow and Slurred  Volume:  Decreased  Mood:  Depressed  Affect:  Blunt  Thought Process:  Goal Directed  Orientation:  Full (Time, Place, and Person)  Thought Content:  Tangential  Suicidal Thoughts:  Yes.  with intent/plan  Homicidal Thoughts:  No  Memory:  Immediate;   Fair  Judgement:  Fair  Insight:  Fair  Psychomotor Activity:  Decreased  Concentration:  Concentration: Poor  Recall:  Poor  Fund of Knowledge:  Poor  Language:  Negative for issuse  Akathisia:  Negative  Handed:  Right  AIMS (if indicated):     Assets:  Resilience  ADL's:  Intact  Cognition:  WNL  Sleep:       Treatment Plan Summary: Daily contact with patient to assess and evaluate symptoms and progress in treatment, Medication management and Plan For depressive symptoms continue current precautions can contract here but add antidepressant, for chronic migraine and pain complaints obviously not a candidate for opiates, for neurological issues to include seizures continue Keppra, for chemical dependency issues begin detox regimen continue 15-minute checks  Observation Level/Precautions:  Detox  Laboratory:  UDS  Psychotherapy: Cognitive and  rehab based  Medications: Continue some home meds make some adjustments begin detox  Consultations: Not necessary at present  Discharge Concerns: Long-term sobriety and safety  Estimated LOS: 5-7  Other: Dress migraines with non-addicting therapy   Physician Treatment Plan for Primary Diagnosis: Depression recurrent severe without psychosis/substance-induced mood disorder bipolar type by history/cannabis dependency/alcohol dependency/cocaine and benzodiazepine abuse/somatizations/seizure  disorder/chronic headaches and chronically resistant condition by report  Long Term Goal(s): Improvement in symptoms so as ready for discharge  Short Term Goals: Ability to identify changes in lifestyle to reduce recurrence of condition will improve, Ability to disclose and discuss suicidal ideas, Ability to demonstrate self-control will improve and Ability to identify and develop effective coping behaviors will improve  Physician Treatment Plan for Secondary Diagnosis: Active Problems:   MDD (major depressive disorder), recurrent severe, without psychosis ([place])  Long Term Goal(s): Improvement in symptoms so as ready for discharge  Short Term Goals: Ability to disclose and discuss suicidal ideas, Ability to demonstrate self-control will improve and Ability to identify and develop effective coping behaviors will improve  I certify that inpatient services furnished can reasonably be expected to improve the patient's condition.    Johnn Hai, MD 1/2/202010:56 AM

## 2018-11-18 NOTE — Progress Notes (Signed)
Psychoeducational Group Note  Date:  11/18/2018 Time:  2030   Group Topic/Focus:  wrap up group  Participation Level: Did Not Attend  Participation Quality:  Not Applicable  Affect:  Not Applicable  Cognitive:  Not Applicable  Insight:  Not Applicable  Engagement in Group: Not Applicable  Additional Comments:  Pt was notified that group was beginning but remained in bed.   Shellia Cleverly 11/18/2018, 9:59 PM
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
## 2018-11-18 NOTE — BHH Suicide Risk Assessment (Signed)
[facility] Admission Suicide Risk Assessment   Nursing information obtained from:  Patient Demographic factors:  Male, Caucasian, Unemployed, Low socioeconomic status Current Mental Status:  Suicidal ideation indicated by patient, Suicide plan, Self-harm thoughts, Plan includes specific time, place, or method, Intention to act on suicide plan, Belief that plan would result in death Loss Factors:  Decline in physical health Historical Factors:  Prior suicide attempts, Impulsivity Risk Reduction Factors:  Sense of responsibility to family, Living with another person, especially a relative, Positive social support, Positive therapeutic relationship  Total Time spent with patient: 45 minutes Principal Problem: Suicidal thinking in the context of cannabis dependency/alcohol dependency/history of substance-induced mood disorder bipolar type/recurrent depression/seizure disorder/chronic migraine complaints/recent failure of rehab measures and relapse immediately upon discharge in 2019 Diagnosis:  Active Problems:   MDD (major depressive disorder), recurrent severe, without psychosis ([place])  Subjective Data: Patient in bed making no eye contact endorsing suicidal thoughts and plans to overdose on heroin endorsing it that in the past he had similarly tried overdosing on cocaine in a suicide attempt  Continued Clinical Symptoms:  Alcohol Use Disorder Identification Test Final Score (AUDIT): 21 The "Alcohol Use Disorders Identification Test", Guidelines for Use in Primary Care, Second Edition.  World Pharmacologist [facility]). Score between 0-7:  no or low risk or alcohol related problems. Score between 8-15:  moderate risk of alcohol related problems. Score between 16-19:  high risk of alcohol related problems. Score 20 or above:  warrants further diagnostic evaluation for alcohol dependence and treatment.   CLINICAL FACTORS:  Chemical dependency/recalcitrant/chronic medical issues/frustrations of family  and support system   COGNITIVE FEATURES THAT CONTRIBUTE TO RISK:  Polarized thinking and Thought constriction (tunnel vision)    SUICIDE RISK:   Moderate:  Frequent suicidal ideation with limited intensity, and duration, some specificity in terms of plans, no associated intent, good self-control, limited dysphoria/symptomatology, some risk factors present, and identifiable protective factors, including available and accessible social support.  PLAN OF CARE: We will begin detox measures address migraines with non-addicting measures address depression full treatment per team continue current precautions patient agrees to contract for safety while here  I certify that inpatient services furnished can reasonably be expected to improve the patient's condition.   Johnn Hai, MD 11/18/2018, 11:08 AM

## 2018-11-18 NOTE — ED Notes (Signed)
ED TO INPATIENT HANDOFF REPORT  Name/Age/Gender Spencer Mendoza 58 y.o. male  Code Status    Code Status Orders  (From admission, onward)         Start     Ordered   11/18/18 0248  Full code  Continuous     11/18/18 0247        Code Status History    Date Active Date Inactive Code Status Order ID Comments User Context   01/29/2018 1336 02/06/2018 1353 Full Code [id number]  Consuello Closs, NP Inpatient   01/28/2018 1422 01/29/2018 1042 Full Code [id number]  Margarita Mail, PA-C ED   01/25/2018 1340 01/26/2018 1601 Full Code [id number]  Joanne Gavel, PA-C ED   05/21/2015 1623 05/22/2015 1922 Full Code [id number]  Virgel Manifold, MD ED   11/28/2014 1845 12/02/2014 0433 Full Code [id number]  Benjamine Mola, [place] Inpatient   11/27/2014 2204 11/28/2014 1845 Full Code [id number]  Britt Bottom, NP ED   12/04/2013 1355 12/07/2013 1605 Full Code [id number]  Cristal Ford, DO Inpatient   07/28/2013 2259 08/03/2013 1714 Full Code [id number]  Ma Hillock, DO Inpatient      Home/SNF/Other Home  Chief Complaint Suicidal  Level of Care/Admitting Diagnosis ED Disposition    None      Medical History Past Medical History:  Diagnosis Date  . Allergy   . Anxiety   . Bipolar disorder ([place])   . Chronic insomnia 06/06/2015  . Depression   . Epilepsy ([place])   . Hemiplegic migraine with status migrainosus 06/06/2015  . Hypertension   . Migraine   . Stroke [facility])    patient denies  . Substance abuse (HCC)     Allergies Allergies  Allergen Reactions  . Codeine Nausea And Vomiting  . Depakote [Divalproex Sodium] Other (See Comments)    Made patient shake  . Migranal [Dihydroergotamine] Nausea And Vomiting  . Topamax [Topiramate] Other (See Comments)    Made patient angry    IV Location/Drains/Wounds Patient Lines/Drains/Airways Status   Active Line/Drains/Airways    None          Labs/Imaging Results for orders placed or performed during the hospital encounter of  11/18/18 (from the past 48 hour(s))  Comprehensive metabolic panel     Status: Abnormal   Collection Time: 11/18/18  2:46 AM  Result Value Ref Range   Sodium 139 135 - 145 mmol/L   Potassium 3.7 3.5 - 5.1 mmol/L   Chloride 104 98 - 111 mmol/L   CO2 21 (L) 22 - 32 mmol/L   Glucose, Bld 82 70 - 99 mg/dL   BUN 12 6 - 20 mg/dL   Creatinine, Ser 0.94 0.61 - 1.24 mg/dL   Calcium 9.3 8.9 - 10.3 mg/dL   Total Protein 7.3 6.5 - 8.1 g/dL   Albumin 4.5 3.5 - 5.0 g/dL   AST 24 15 - 41 U/L   ALT 20 0 - 44 U/L   Alkaline Phosphatase 81 38 - 126 U/L   Total Bilirubin 0.5 0.3 - 1.2 mg/dL   GFR calc non Af Amer >60 >60 mL/min   GFR calc Af Amer >60 >60 mL/min   Anion gap 14 5 - 15    Comment: Performed at [facility], [place] [address] [place], [place] [address]  Ethanol     Status: Abnormal   Collection Time: 11/18/18  2:46 AM  Result Value Ref Range   Alcohol, Ethyl (B) 81 (H) <10 mg/dL    Comment: (  NOTE) Lowest detectable limit for serum alcohol is 10 mg/dL. For medical purposes only. Performed at [facility], [place] [address], [place], [place] [address]   Salicylate level     Status: None   Collection Time: 11/18/18  2:46 AM  Result Value Ref Range   Salicylate Lvl <6.0 2.8 - 30.0 mg/dL    Comment: Performed at [facility], [place] [address] [place], [place] [address]  Acetaminophen level     Status: Abnormal   Collection Time: 11/18/18  2:46 AM  Result Value Ref Range   Acetaminophen (Tylenol), Serum <10 (L) 10 - 30 ug/mL    Comment: (NOTE) Therapeutic concentrations vary significantly. A range of 10-30 ug/mL  may be an effective concentration for many patients. However, some  are best treated at concentrations outside of this range. Acetaminophen concentrations >150 ug/mL at 4 hours after ingestion  and >50 ug/mL at 12 hours after ingestion are often associated with  toxic reactions. Performed at [facility] [place] [address] [place], [place] [address]   cbc     Status: None   Collection Time: 11/18/18  2:46 AM  Result Value Ref Range   WBC 8.8 4.0 - 10.5 K/uL   RBC 4.82 4.22 - 5.81 MIL/uL   Hemoglobin 15.6 13.0 - 17.0 g/dL   HCT 46.0 39.0 - 52.0 %   MCV 95.4 80.0 - 100.0 fL   MCH 32.4 26.0 - 34.0 pg   MCHC 33.9 30.0 - 36.0 g/dL   RDW 12.6 11.5 - 15.5 %   Platelets 259 150 - 400 K/uL   nRBC 0.0 0.0 - 0.2 %    Comment: Performed at [facility], [place] [address], [place], [place] [address]  Rapid urine drug screen (hospital performed)     Status: Abnormal   Collection Time: 11/18/18  5:18 AM  Result Value Ref Range   Opiates NONE DETECTED NONE DETECTED   Cocaine POSITIVE (A) NONE DETECTED   Benzodiazepines POSITIVE (A) NONE DETECTED   Amphetamines NONE DETECTED NONE DETECTED   Tetrahydrocannabinol POSITIVE (A) NONE DETECTED   Barbiturates NONE DETECTED NONE DETECTED    Comment: (NOTE) DRUG SCREEN FOR MEDICAL PURPOSES ONLY.  IF CONFIRMATION IS NEEDED FOR ANY PURPOSE, NOTIFY LAB WITHIN 5 DAYS. LOWEST DETECTABLE LIMITS FOR URINE DRUG SCREEN Drug Class                     Cutoff (ng/mL) Amphetamine and metabolites    1000 Barbiturate and metabolites    200 Benzodiazepine                 562 Tricyclics and metabolites     300 Opiates and metabolites        300 Cocaine and metabolites        300 THC                            50 Performed at [facility], [place] [address] [place],  [address]    No results found.  Pending Labs Unresulted Labs (From admission, onward)   None      Vitals/Pain Today's Vitals   11/18/18 0502 11/18/18 0632 11/18/18 0801 11/18/18 0820  BP: 99/74  (!) 86/55 102/84  Pulse: 67  71   Resp: 16  16   Temp:   98.6 F (37 C)   TempSrc:   Oral   SpO2:  94%  98%   Weight:      Height:      PainSc:  Asleep      Isolation Precautions No active isolations  Medications Medications  albuterol  (PROVENTIL HFA;VENTOLIN HFA) 108 (90 Base) MCG/ACT inhaler 2 puff (has no administration in time range)  ALPRAZolam (XANAX) tablet 0.5 mg (has no administration in time range)  levETIRAcetam (KEPPRA) tablet 500 mg (500 mg Oral Given 11/18/18 0418)  acetaminophen (TYLENOL) tablet 650 mg (650 mg Oral Given 11/18/18 0501)    Mobility walks

## 2018-11-18 NOTE — ED Provider Notes (Signed)
TIME SEEN: 2:46 AM  CHIEF COMPLAINT: Suicidal thoughts  HPI: Patient is a 58 year old male with history of bipolar disorder, epilepsy, hypertension, migraines who presents to the emergency department with suicidal thoughts.  Patient states that this is been going on for "a long time".  He told nursing staff that he plans to overdose.  Tells me that "I just want to end it all".  Denies HI.  No hallucinations.  He denies any drug or alcohol use.  Denies any pain.  ROS: See HPI Constitutional: no fever  Eyes: no drainage  ENT: no runny nose   Cardiovascular:  no chest pain  Resp: no SOB  GI: no vomiting GU: no dysuria Integumentary: no rash  Allergy: no hives  Musculoskeletal: no leg swelling  Neurological: no slurred speech ROS otherwise negative  PAST MEDICAL HISTORY/PAST SURGICAL HISTORY:  Past Medical History:  Diagnosis Date  . Allergy   . Anxiety   . Bipolar disorder ([place])   . Chronic insomnia 06/06/2015  . Depression   . Epilepsy ([place])   . Hemiplegic migraine with status migrainosus 06/06/2015  . Hypertension   . Migraine   . Stroke [facility])    patient denies  . Substance abuse ([place])     MEDICATIONS:  Prior to Admission medications   Medication Sig Start Date End Date Taking? Authorizing Provider  albuterol (PROVENTIL HFA;VENTOLIN HFA) 108 (90 Base) MCG/ACT inhaler Inhale 2 puffs into the lungs every 4 (four) hours as needed for shortness of breath or wheezing. 05/01/18  Yes [provider]  ALPRAZolam Duanne Moron) 0.5 MG tablet Take 0.5 mg by mouth 3 (three) times daily.   Yes [provider]  levETIRAcetam (KEPPRA) 500 MG tablet Take 1 tablet (500 mg total) by mouth 2 (two) times daily. 09/17/18  Yes Ladell Pier, MD  FLUoxetine (PROZAC) 20 MG capsule Take 1 capsule (20 mg total) by mouth daily. Patient not taking: Reported on 09/17/2018 02/07/18   Rankin, Shuvon B, NP  Multiple Vitamin (MULTIVITAMIN WITH MINER[place]) TABS tablet Take 1 tablet by mouth  daily. Patient not taking: Reported on 11/18/2018 02/07/18   Rankin, Shuvon B, NP  propranolol (INDERAL) 20 MG tablet Take 1 tablet (20 mg total) by mouth 2 (two) times daily. Patient not taking: Reported on 09/17/2018 02/06/18   Rankin, Delphia Grates B, NP    ALLERGIES:  Allergies  Allergen Reactions  . Codeine Nausea And Vomiting  . Depakote [Divalproex Sodium] Other (See Comments)    Made patient shake  . Migranal [Dihydroergotamine] Nausea And Vomiting  . Topamax [Topiramate] Other (See Comments)    Made patient angry    SOCIAL HISTORY:  Social History   Tobacco Use  . Smoking status: Current Every Day Smoker    Packs/day: 1.00    Years: 15.00    Pack years: 15.00    Types: Cigarettes  . Smokeless tobacco: Never Used  Substance Use Topics  . Alcohol use: Yes    Alcohol/week: 6.0 standard drinks    Types: 6 Cans of beer per week    Comment: pt reports no ETOH and no durgs in 3 moths.     FAMILY HISTORY: Family History  Problem Relation Age of Onset  . Heart disease Mother   . Stroke Mother   . Epilepsy Mother   . Heart disease Father   . Hypertension Father   . Melanoma Father   . Migraines Father   . Colon cancer Sister   . Colon cancer Cousin  EXAM: BP 122/89 (BP Location: Right Arm)   Pulse 77   Temp 98 F (36.7 C) (Oral)   Resp 14   Ht 6\' 1"  (1.854 m)   Wt 79.4 kg   SpO2 98%   BMI 23.09 kg/m  CONSTITUTIONAL: Alert and oriented and responds appropriately to questions. Well-appearing; well-nourished HEAD: Normocephalic EYES: Conjunctivae clear, pupils appear equal, EOMI ENT: normal nose; moist mucous membranes NECK: Supple, no meningismus, no nuchal rigidity, no LAD  CARD: RRR; S1 and S2 appreciated; no murmurs, no clicks, no rubs, no gallops RESP: Normal chest excursion without splinting or tachypnea; breath sounds clear and equal bilaterally; no wheezes, no rhonchi, no rales, no hypoxia or respiratory distress, speaking full sentences ABD/GI: Normal  bowel sounds; non-distended; soft, non-tender, no rebound, no guarding, no peritoneal signs, no hepatosplenomegaly BACK:  The back appears normal and is non-tender to palpation, there is no CVA tenderness EXT: Normal ROM in all joints; non-tender to palpation; no edema; normal capillary refill; no cyanosis, no calf tenderness or swelling    SKIN: Normal color for age and race; warm; no rash NEURO: Moves all extremities equally PSYCH: Flat affect.  Endorses helplessness and hopelessness.  Seems very depressed.  Endorses SI with plan.  No HI or hallucinations.  MEDICAL DECISION MAKING: Patient here with suicidal thoughts.  Reports he has been admitted for psychiatric treatment before.  I feel he would benefit from the same.  Will obtain screening labs and urine.  Will consult TTS.  No medical complaints at this time.  ED PROGRESS: Patient screening labs unremarkable other than alcohol of 81.  TTS has evaluated patient and recommended inpatient treatment and I agree.  He is here voluntarily at this time.   I reviewed all nursing notes, vitals, pertinent previous records, EKGs, lab and urine results, imaging (as available).      Alizey Noren, Delice Bison, DO 11/18/18 [phone]

## 2018-11-18 NOTE — ED Notes (Signed)
Pt oriented to unit as his bed became ready at [facility].  Pt will be transferred asap.  [facility] transportation called.
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
## 2018-11-18 NOTE — ED Notes (Signed)
Pt discharged per order. Pt was ambulatory and in no acute distress when escorted from unit via [facility] transport driver, who was given belongings.

## 2018-11-18 NOTE — ED Notes (Signed)
Patient made aware that urine sample is needed.  

## 2018-11-18 NOTE — ED Notes (Signed)
Bed: [facility] Expected date:  Expected time:  Means of arrival:  Comments: Hold for hall c

## 2018-11-18 NOTE — ED Notes (Signed)
Pt's BP was has decreased from 122/89 to 86/55 since arrival to ED.  Provider notified, and recommended pt walk around some and drink/eat.

## 2018-11-18 NOTE — ED Notes (Signed)
Pt given water and walked to a room to manually check BP.  BP 102/84.

## 2018-11-18 NOTE — ED Triage Notes (Signed)
Per Pt: Pt reports he has a hx of migraines that are debilitating and he does not want to live anymore. Pt reports he is manic depressive and it is not under control. Pt is tearful in triage and states he "gets paid on the 3rd and is going to OD on heroin"

## 2018-11-18 NOTE — Progress Notes (Signed)
Pt has been tentatively accepted to [facility] pending review of UDS. Bed assignment 302-1 to Dr. Mallie Darting, MD. Call to report [phone]. ED staff [place], [place] April, RN have been advised.   Lind Covert, MSW, LCSW Therapeutic Triage Specialist  [phone]

## 2018-11-18 NOTE — Progress Notes (Signed)
[place] reviewed. Pt is positive for cocaine, Benzodiazepines, and cannabis. Pt appropriate for 300 hall bed. VOL paperwork signed and faxed to [facility] for review. Bed available after 08:00.  Lind Covert, MSW, LCSW Therapeutic Triage Specialist  [phone]

## 2018-11-18 NOTE — BHH Group Notes (Signed)
LCSW Group Therapy Note  11/18/2018 1:15pm  Type of Therapy and Topic:  Group Therapy: Avoiding Self-Sabotaging and Enabling Behaviors  Participation Level:  Did Not Attend-pt invited. Chose to remain in bed.   Description of Group:   In this group, patients will learn how to identify obstacles, self-sabotaging and enabling behaviors, as well as: what are they, why do we do them and what needs these behaviors meet. Discuss unhealthy relationships and how to have positive healthy boundaries with those that sabotage and enable. Explore aspects of self-sabotage and enabling in yourself and how to limit these self-destructive behaviors in everyday life.   Therapeutic Goals: 1. Patient will identify one obstacle that relates to self-sabotage and enabling behaviors 2. Patient will identify one personal self-sabotaging or enabling behavior they did prior to admission 3. Patient will state a plan to change the above identified behavior 4. Patient will demonstrate ability to communicate their needs through discussion and/or role play.   Summary of Patient Progress:  x   Therapeutic Modalities:   Cognitive Behavioral Therapy Person-Centered Therapy Motivational Interviewing   Avelina Laine, LCSW 11/18/2018 1:50 PM

## 2018-11-18 NOTE — Tx Team (Signed)
Initial Treatment Plan 11/18/2018 10:51 AM  [place] OFP:[id number]    PATIENT STRESSORS: Health problems Substance abuse   PATIENT STRENGTHS: General fund of knowledge Supportive family/friends   PATIENT IDENTIFIED PROBLEMS: "My migraines are too painful."  Depression/anxiety  Substance abuse                 DISCHARGE CRITERIA:  Improved stabilization in mood, thinking, and/or behavior Medical problems require only outpatient monitoring Safe-care adequate arrangements made Verbal commitment to aftercare and medication compliance Withdrawal symptoms are absent or subacute and managed without 24-hour nursing intervention  PRELIMINARY DISCHARGE PLAN: Outpatient therapy Return to previous living arrangement  PATIENT/FAMILY INVOLVEMENT: This treatment plan has been presented to and reviewed with the patient, Spencer Mendoza.  The patient and family have been given the opportunity to ask questions and make suggestions.  Megan Mans, RN 11/18/2018, 10:51 AM

## 2018-11-18 NOTE — BH Assessment (Signed)
[facility] Assessment Progress Note  Per Patriciaann Clan, PA, this pt requires psychiatric hospitalization at this time.  Letitia Libra, RN, [facility] has assigned pt to [facility]1.  Pt has signed Voluntary Admission and Consent for Treatment.  Pt's nurse, Nena Jordan, has been notified, and agrees to send original paperwork along with pt via [facility], and to call report to [phone].  Jalene Mullet, [place] Coordinator [phone]

## 2018-11-18 NOTE — Progress Notes (Signed)
Pt is recommended for inpt treatment per Patriciaann Clan, PA. EDP Ward, Delice Bison, DO has been advised.   Lind Covert, MSW, LCSW Therapeutic Triage Specialist  [phone]

## 2018-11-18 NOTE — ED Notes (Signed)
Patient is having TTS consult.

## 2018-11-19 DIAGNOSIS — F419 Anxiety disorder, unspecified: Secondary | ICD-10-CM

## 2018-11-19 DIAGNOSIS — G4700 Insomnia, unspecified: Secondary | ICD-10-CM

## 2018-11-19 MED ORDER — ENSURE ENLIVE PO LIQD
237.0000 mL | ORAL | Status: DC
  Administered 2018-11-20 – 2018-11-21 (×2): 237 mL via ORAL

## 2018-11-19 MED ORDER — BOOST / RESOURCE BREEZE PO LIQD CUSTOM
1.0000 | ORAL | Status: DC
  Administered 2018-11-19 – 2018-11-22 (×4): 1 via ORAL
  Filled 2018-11-19 (×6): qty 1

## 2018-11-19 MED ORDER — KETOROLAC TROMETHAMINE 60 MG/2ML IM SOLN
60.0000 mg | Freq: Every day | INTRAMUSCULAR | Status: DC | PRN
  Administered 2018-11-19 – 2018-11-20 (×2): 60 mg via INTRAMUSCULAR
  Filled 2018-11-19 (×2): qty 2

## 2018-11-19 NOTE — Progress Notes (Signed)
Pt has been in the bed most of the evening.  He got up around 2200 to get a snack and his night time meds. He is still c/o a migraine, but only has Tylenol ordered.  Writer spoke to the evening provider who ordered an IM Toradol 30 mg to be given.  Injection given in the L deltoid.  Pt was given gatorade for low BP.  He sat in the dayroom for a short time and returned to bed.  Pt denies HI/AVH, but says he is still passive SI because of his migraine pain.  He talks in a low tone that is sometimes difficult to hear and had to be asked more than once to repeat what he had said.  Pt voiced no other needs or concerns.  Support and encouragement offered.  Discharge plans are in process.  Safety maintained with q15 minute checks.

## 2018-11-19 NOTE — BHH Group Notes (Signed)
The focus of this group is to help patients establish daily goals to achieve during treatment and discuss how the patient can incorporate goal setting into their daily lives to aide in recovery.  PT did not attend 

## 2018-11-19 NOTE — Progress Notes (Signed)
[facility] MD Progress Note  11/19/2018 10:24 AM Spencer Mendoza  MRN:  [id number] Subjective:   Remains generally stable as far as behavior but still threatening suicide if he leaves, still focused on headaches, and becomes irritable and poorly cooperative when I engage him in rehab based therapy and discussed the problem of Xanax dependency, comorbid polysubstance abuse so forth.  Patient becomes irritable and refuses to cooperate with these types of discussions and therapy. Continues to be able to contract here Principal Problem: Principal problem remains 1 of chemical dependency patient in denial that this is primary Diagnosis: Active Problems:   MDD (major depressive disorder), recurrent severe, without psychosis ([place])  Total Time spent with patient: 20 minutes  Past Medical History:  Past Medical History:  Diagnosis Date  . Allergy   . Anxiety   . Bipolar disorder ([place])   . Chronic insomnia 06/06/2015  . Depression   . Epilepsy ([place])   . Hemiplegic migraine with status migrainosus 06/06/2015  . Hypertension   . Migraine   . Stroke [facility])    patient denies  . Substance abuse [facility])     Past Surgical History:  Procedure Laterality Date  . APPENDECTOMY    . KNEE ARTHROSCOPY Right   . NOSE SURGERY     Family History:  Family History  Problem Relation Age of Onset  . Heart disease Mother   . Stroke Mother   . Epilepsy Mother   . Heart disease Father   . Hypertension Father   . Melanoma Father   . Migraines Father   . Colon cancer Sister   . Colon cancer Cousin     Social History:  Social History   Substance and Sexual Activity  Alcohol Use Yes  . Alcohol/week: 6.0 standard drinks  . Types: 6 Cans of beer per week   Comment: pt reports no ETOH and no durgs in 3 moths.      Social History   Substance and Sexual Activity  Drug Use Yes  . Frequency: 3.0 times per week  . Types: Marijuana, Heroin, Cocaine   Comment: crack, heroin pt reports no ETOH and no durgs in 3  moths.     Social History   Socioeconomic History  . Marital status: Divorced    Spouse name: Not on file  . Number of children: 0  . Years of education: GED  . Highest education level: Not on file  Occupational History  . Occupation: disabled  Social Needs  . Financial resource strain: Not on file  . Food insecurity:    Worry: Not on file    Inability: Not on file  . Transportation needs:    Medical: Not on file    Non-medical: Not on file  Tobacco Use  . Smoking status: Current Every Day Smoker    Packs/day: 1.00    Years: 15.00    Pack years: 15.00    Types: Cigarettes  . Smokeless tobacco: Never Used  Substance and Sexual Activity  . Alcohol use: Yes    Alcohol/week: 6.0 standard drinks    Types: 6 Cans of beer per week    Comment: pt reports no ETOH and no durgs in 3 moths.   . Drug use: Yes    Frequency: 3.0 times per week    Types: Marijuana, Heroin, Cocaine    Comment: crack, heroin pt reports no ETOH and no durgs in 3 moths.   . Sexual activity: Yes    Birth control/protection: Condom  Lifestyle  .  Physical activity:    Days per week: Not on file    Minutes per session: Not on file  . Stress: Not on file  Relationships  . Social connections:    Talks on phone: Not on file    Gets together: Not on file    Attends religious service: Not on file    Active member of club or organization: Not on file    Attends meetings of clubs or organizations: Not on file    Relationship status: Not on file  Other Topics Concern  . Not on file  Social History Narrative   Pt lives with sister.   Patient drinks 2 cups of caffeine daily.   Patient is right handed.   Additional Social History:                         Sleep: Fair  Appetite:  Fair  Current Medications: Current Facility-Administered Medications  Medication Dose Route Frequency Provider Last Rate Last Dose  . acetaminophen (TYLENOL) tablet 650 mg  650 mg Oral Q6H PRN Ethelene Hal,  NP   650 mg at 11/19/18 0610  . albuterol (PROVENTIL HFA;VENTOLIN HFA) 108 (90 Base) MCG/ACT inhaler 2 puff  2 puff Inhalation Q4H PRN Ethelene Hal, NP      . alum & mag hydroxide-simeth (MAALOX/MYLANTA) 200-200-20 MG/5ML suspension 30 mL  30 mL Oral Q4H PRN Ethelene Hal, NP      . chlordiazePOXIDE (LIBRIUM) capsule 25 mg  25 mg Oral Q6H PRN Johnn Hai, MD   25 mg at 11/19/18 0810  . feeding supplement (BOOST / RESOURCE BREEZE) liquid 1 Container  1 Container Oral Q24H Sharma Covert, MD      . feeding supplement (ENSURE ENLIVE) (ENSURE ENLIVE) liquid 237 mL  237 mL Oral Q24H Sharma Covert, MD      . FLUoxetine (PROZAC) capsule 20 mg  20 mg Oral Daily Sharma Covert, MD   20 mg at 11/19/18 2951  . folic acid (FOLVITE) tablet 1 mg  1 mg Oral Daily Sharma Covert, MD   1 mg at 11/19/18 8841  . gabapentin (NEURONTIN) capsule 300 mg  300 mg Oral TID Sharma Covert, MD   300 mg at 11/19/18 6606  . hydrOXYzine (ATARAX/VISTARIL) tablet 25 mg  25 mg Oral Q6H PRN Johnn Hai, MD      . ketorolac (TORADOL) injection 60 mg  60 mg Intramuscular Daily PRN Johnn Hai, MD      . levETIRAcetam (KEPPRA) tablet 500 mg  500 mg Oral BID Sharma Covert, MD   500 mg at 11/19/18 3016  . loperamide (IMODIUM) capsule 2-4 mg  2-4 mg Oral PRN Johnn Hai, MD      . magnesium hydroxide (MILK OF MAGNESIA) suspension 30 mL  30 mL Oral Daily PRN Ethelene Hal, NP      . multivitamin with minerals tablet 1 tablet  1 tablet Oral Daily Sharma Covert, MD   1 tablet at 11/19/18 [phone]  . nicotine (NICODERM CQ - dosed in mg/24 hours) patch 21 mg  21 mg Transdermal Daily Sharma Covert, MD   21 mg at 11/19/18 3235  . ondansetron (ZOFRAN-ODT) disintegrating tablet 4 mg  4 mg Oral Q6H PRN Johnn Hai, MD      . thiamine (B-1) injection 100 mg  100 mg Intramuscular Once Johnn Hai, MD      . thiamine (VITAMIN B-1) tablet 100  mg  100 mg Oral Daily Johnn Hai, MD   100 mg  at 11/19/18 5784  . traZODone (DESYREL) tablet 100 mg  100 mg Oral QHS PRN Johnn Hai, MD   100 mg at 11/18/18 2125    Lab Results:  Results for orders placed or performed during the hospital encounter of 11/18/18 (from the past 48 hour(s))  Comprehensive metabolic panel     Status: Abnormal   Collection Time: 11/18/18  2:46 AM  Result Value Ref Range   Sodium 139 135 - 145 mmol/L   Potassium 3.7 3.5 - 5.1 mmol/L   Chloride 104 98 - 111 mmol/L   CO2 21 (L) 22 - 32 mmol/L   Glucose, Bld 82 70 - 99 mg/dL   BUN 12 6 - 20 mg/dL   Creatinine, Ser 0.94 0.61 - 1.24 mg/dL   Calcium 9.3 8.9 - 10.3 mg/dL   Total Protein 7.3 6.5 - 8.1 g/dL   Albumin 4.5 3.5 - 5.0 g/dL   AST 24 15 - 41 U/L   ALT 20 0 - 44 U/L   Alkaline Phosphatase 81 38 - 126 U/L   Total Bilirubin 0.5 0.3 - 1.2 mg/dL   GFR calc non Af Amer >60 >60 mL/min   GFR calc Af Amer >60 >60 mL/min   Anion gap 14 5 - 15    Comment: Performed at [facility], [place] [address], [place], [place] [address]  Ethanol     Status: Abnormal   Collection Time: 11/18/18  2:46 AM  Result Value Ref Range   Alcohol, Ethyl (B) 81 (H) <10 mg/dL    Comment: (NOTE) Lowest detectable limit for serum alcohol is 10 mg/dL. For medical purposes only. Performed at [facility], [place] [address] [place], [place] [address]   Salicylate level     Status: None   Collection Time: 11/18/18  2:46 AM  Result Value Ref Range   Salicylate Lvl <3.2 2.8 - 30.0 mg/dL    Comment: Performed at [facility], [place] [address], [place], [place] [address]  Acetaminophen level     Status: Abnormal   Collection Time: 11/18/18  2:46 AM  Result Value Ref Range   Acetaminophen (Tylenol), Serum <10 (L) 10 - 30 ug/mL    Comment: (NOTE) Therapeutic concentrations vary significantly. A range of 10-30 ug/mL  may be an effective concentration for many patients. However, some  are best treated at concentrations outside of  this range. Acetaminophen concentrations >150 ug/mL at 4 hours after ingestion  and >50 ug/mL at 12 hours after ingestion are often associated with  toxic reactions. Performed at [facility], [place] [address], [place], [place] [address]   cbc     Status: None   Collection Time: 11/18/18  2:46 AM  Result Value Ref Range   WBC 8.8 4.0 - 10.5 K/uL   RBC 4.82 4.22 - 5.81 MIL/uL   Hemoglobin 15.6 13.0 - 17.0 g/dL   HCT 46.0 39.0 - 52.0 %   MCV 95.4 80.0 - 100.0 fL   MCH 32.4 26.0 - 34.0 pg   MCHC 33.9 30.0 - 36.0 g/dL   RDW 12.6 11.5 - 15.5 %   Platelets 259 150 - 400 K/uL   nRBC 0.0 0.0 - 0.2 %    Comment: Performed at [facility], [place] [address] [place], [place] [address]  Rapid urine drug screen (hospital performed)     Status: Abnormal   Collection  Time: 11/18/18  5:18 AM  Result Value Ref Range   Opiates NONE DETECTED NONE DETECTED   Cocaine POSITIVE (A) NONE DETECTED   Benzodiazepines POSITIVE (A) NONE DETECTED   Amphetamines NONE DETECTED NONE DETECTED   Tetrahydrocannabinol POSITIVE (A) NONE DETECTED   Barbiturates NONE DETECTED NONE DETECTED    Comment: (NOTE) DRUG SCREEN FOR MEDICAL PURPOSES ONLY.  IF CONFIRMATION IS NEEDED FOR ANY PURPOSE, NOTIFY LAB WITHIN 5 DAYS. LOWEST DETECTABLE LIMITS FOR URINE DRUG SCREEN Drug Class                     Cutoff (ng/mL) Amphetamine and metabolites    1000 Barbiturate and metabolites    200 Benzodiazepine                 829 Tricyclics and metabolites     300 Opiates and metabolites        300 Cocaine and metabolites        300 THC                            50 Performed at [facility], [place] [address] [place], [place] [address]     Blood Alcohol level:  Lab Results  Component Value Date   ETH 81 (H) 11/18/2018   ETH <10 96/78/9381    Metabolic Disorder Labs: Lab Results  Component Value Date   HGBA1C 4.9 02/01/2018   MPG 93.93 02/01/2018   MPG 97  12/05/2013   Lab Results  Component Value Date   PROLACTIN 15.2 02/01/2018   Lab Results  Component Value Date   CHOL 199 02/01/2018   TRIG 229 (H) 02/01/2018   HDL 55 02/01/2018   CHOLHDL 3.6 02/01/2018   VLDL 46 (H) 02/01/2018   LDLCALC 98 02/01/2018   LDLCALC 62 12/05/2013    Physical Findings: AIMS: Facial and Oral Movements Muscles of Facial Expression: None, normal Lips and Perioral Area: None, normal Jaw: None, normal Tongue: None, normal,Extremity Movements Upper (arms, wrists, hands, fingers): None, normal Lower (legs, knees, ankles, toes): None, normal, Trunk Movements Neck, shoulders, hips: None, normal, Overall Severity Severity of abnormal movements (highest score from questions above): None, normal Incapacitation due to abnormal movements: None, normal Patient's awareness of abnormal movements (rate only patient's report): No Awareness, Dental Status Current problems with teeth and/or dentures?: No Does patient usually wear dentures?: No  CIWA:  CIWA-Ar Total: 3 COWS:  COWS Total Score: 5  Musculoskeletal: Strength & Muscle Tone: decreased Gait & Station: shuffle Patient leans: N/A  Psychiatric Specialty Exam: Physical Exam  ROS  Blood pressure 106/87, pulse 91, temperature 98.1 F (36.7 C), resp. rate 16, height 6' (1.829 m), weight 71.7 kg, SpO2 98 %.Body mass index is 21.43 kg/m.  General Appearance: Disheveled  Eye Contact:  None  Speech:  Slurred  Volume:  Decreased  Mood:  Dysphoric  Affect:  Constricted  Thought Process:  Goal Directed  Orientation:  Full (Time, Place, and Person)  Thought Content:  Tangential  Suicidal Thoughts:  Yes.  with intent/plan  Homicidal Thoughts:  No  Memory:  Immediate;   Poor  Judgement:  Impaired  Insight:  Lacking  Psychomotor Activity:  Psychomotor Retardation  Concentration:  Concentration: Poor  Recall:  Poor  Fund of Knowledge:  Fair  Language:  Poor  Akathisia:  Negative  Handed:  Right  AIMS  (if indicated):     Assets:  Leisure Time  ADL's:  Intact  Cognition:  WNL  Sleep:  Number of Hours: 5.5     Treatment Plan Summary: Daily contact with patient to assess and evaluate symptoms and progress in treatment, Medication management and Plan For chemical dependency issues continue to engage in rehab based therapy despite his denial continue detox measures, add Toradol IM for headache complaints continue to engage in cognitive-based therapies  Johnn Hai, MD 11/19/2018, 10:24 AM
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
## 2018-11-19 NOTE — Plan of Care (Signed)
  Problem: Education: Goal: Emotional status will improve Outcome: Not Progressing Goal: Mental status will improve Outcome: Not Progressing Goal: Verbalization of understanding the information provided will improve Outcome: Not Progressing   Problem: Activity: Goal: Interest or engagement in activities will improve Outcome: Not Progressing Goal: Sleeping patterns will improve Outcome: Not Progressing   Problem: Coping: Goal: Ability to verbalize frustrations and anger appropriately will improve Outcome: Not Progressing

## 2018-11-19 NOTE — Tx Team (Signed)
Interdisciplinary Treatment and Diagnostic Plan Update  11/19/2018 Time of Session: 9:00am Spencer Mendoza MRN: [id number]  Principal Diagnosis: <principal problem not specified>  Secondary Diagnoses: Active Problems:   MDD (major depressive disorder), recurrent severe, without psychosis ([place])   Current Medications:  Current Facility-Administered Medications  Medication Dose Route Frequency Provider Last Rate Last Dose  . acetaminophen (TYLENOL) tablet 650 mg  650 mg Oral Q6H PRN Ethelene Hal, NP   650 mg at 11/19/18 0610  . albuterol (PROVENTIL HFA;VENTOLIN HFA) 108 (90 Base) MCG/ACT inhaler 2 puff  2 puff Inhalation Q4H PRN Ethelene Hal, NP      . alum & mag hydroxide-simeth (MAALOX/MYLANTA) 200-200-20 MG/5ML suspension 30 mL  30 mL Oral Q4H PRN Ethelene Hal, NP      . chlordiazePOXIDE (LIBRIUM) capsule 25 mg  25 mg Oral Q6H PRN Johnn Hai, MD   25 mg at 11/19/18 0810  . feeding supplement (BOOST / RESOURCE BREEZE) liquid 1 Container  1 Container Oral Q24H Sharma Covert, MD      . feeding supplement (ENSURE ENLIVE) (ENSURE ENLIVE) liquid 237 mL  237 mL Oral Q24H Sharma Covert, MD      . FLUoxetine (PROZAC) capsule 20 mg  20 mg Oral Daily Sharma Covert, MD   20 mg at 11/19/18 2993  . folic acid (FOLVITE) tablet 1 mg  1 mg Oral Daily Sharma Covert, MD   1 mg at 11/19/18 7169  . gabapentin (NEURONTIN) capsule 300 mg  300 mg Oral TID Sharma Covert, MD   300 mg at 11/19/18 6789  . hydrOXYzine (ATARAX/VISTARIL) tablet 25 mg  25 mg Oral Q6H PRN Johnn Hai, MD      . levETIRAcetam (KEPPRA) tablet 500 mg  500 mg Oral BID Sharma Covert, MD   500 mg at 11/19/18 3810  . loperamide (IMODIUM) capsule 2-4 mg  2-4 mg Oral PRN Johnn Hai, MD      . magnesium hydroxide (MILK OF MAGNESIA) suspension 30 mL  30 mL Oral Daily PRN Ethelene Hal, NP      . multivitamin with minerals tablet 1 tablet  1 tablet Oral Daily Sharma Covert,  MD   1 tablet at 11/19/18 [phone]  . nicotine (NICODERM CQ - dosed in mg/24 hours) patch 21 mg  21 mg Transdermal Daily Sharma Covert, MD   21 mg at 11/19/18 0258  . ondansetron (ZOFRAN-ODT) disintegrating tablet 4 mg  4 mg Oral Q6H PRN Johnn Hai, MD      . thiamine (B-1) injection 100 mg  100 mg Intramuscular Once Johnn Hai, MD      . thiamine (VITAMIN B-1) tablet 100 mg  100 mg Oral Daily Johnn Hai, MD   100 mg at 11/19/18 5277  . traZODone (DESYREL) tablet 100 mg  100 mg Oral QHS PRN Johnn Hai, MD   100 mg at 11/18/18 2125   PTA Medications: Medications Prior to Admission  Medication Sig Dispense Refill Last Dose  . albuterol (PROVENTIL HFA;VENTOLIN HFA) 108 (90 Base) MCG/ACT inhaler Inhale 2 puffs into the lungs every 4 (four) hours as needed for shortness of breath or wheezing.  0 Past Month at Unknown time  . ALPRAZolam (XANAX) 0.5 MG tablet Take 0.5 mg by mouth 3 (three) times daily.   11/17/2018 at Unknown time  . FLUoxetine (PROZAC) 20 MG capsule Take 1 capsule (20 mg total) by mouth daily. (Patient not taking: Reported on 09/17/2018) 30 capsule 0  Not Taking  . levETIRAcetam (KEPPRA) 500 MG tablet Take 1 tablet (500 mg total) by mouth 2 (two) times daily. 60 tablet 2 Past Month at Unknown time  . Multiple Vitamin (MULTIVITAMIN WITH MINERALS) TABS tablet Take 1 tablet by mouth daily. (Patient not taking: Reported on 11/18/2018) 30 tablet 0 Not Taking at Unknown time  . propranolol (INDERAL) 20 MG tablet Take 1 tablet (20 mg total) by mouth 2 (two) times daily. (Patient not taking: Reported on 09/17/2018) 60 tablet 0 Not Taking    Patient Stressors: Health problems Substance abuse  Patient Strengths: General fund of knowledge Supportive family/friends  Treatment Modalities: Medication Management, Group therapy, Case management,  1 to 1 session with clinician, Psychoeducation, Recreational therapy.   Physician Treatment Plan for Primary Diagnosis: <principal problem not  specified> Long Term Goal(s): Improvement in symptoms so as ready for discharge Improvement in symptoms so as ready for discharge   Short Term Goals: Ability to identify changes in lifestyle to reduce recurrence of condition will improve Ability to disclose and discuss suicidal ideas Ability to demonstrate self-control will improve Ability to identify and develop effective coping behaviors will improve Ability to disclose and discuss suicidal ideas Ability to demonstrate self-control will improve Ability to identify and develop effective coping behaviors will improve  Medication Management: Evaluate patient's response, side effects, and tolerance of medication regimen.  Therapeutic Interventions: 1 to 1 sessions, Unit Group sessions and Medication administration.  Evaluation of Outcomes: Progressing  Physician Treatment Plan for Secondary Diagnosis: Active Problems:   MDD (major depressive disorder), recurrent severe, without psychosis ([place])  Long Term Goal(s): Improvement in symptoms so as ready for discharge Improvement in symptoms so as ready for discharge   Short Term Goals: Ability to identify changes in lifestyle to reduce recurrence of condition will improve Ability to disclose and discuss suicidal ideas Ability to demonstrate self-control will improve Ability to identify and develop effective coping behaviors will improve Ability to disclose and discuss suicidal ideas Ability to demonstrate self-control will improve Ability to identify and develop effective coping behaviors will improve     Medication Management: Evaluate patient's response, side effects, and tolerance of medication regimen.  Therapeutic Interventions: 1 to 1 sessions, Unit Group sessions and Medication administration.  Evaluation of Outcomes: Progressing   RN Treatment Plan for Primary Diagnosis: <principal problem not specified> Long Term Goal(s): Knowledge of disease and therapeutic regimen to  maintain health will improve  Short Term Goals: Ability to remain free from injury will improve, Ability to verbalize frustration and anger appropriately will improve, Ability to disclose and discuss suicidal ideas, Ability to identify and develop effective coping behaviors will improve and Compliance with prescribed medications will improve  Medication Management: RN will administer medications as ordered by provider, will assess and evaluate patient's response and provide education to patient for prescribed medication. RN will report any adverse and/or side effects to prescribing provider.  Therapeutic Interventions: 1 on 1 counseling sessions, Psychoeducation, Medication administration, Evaluate responses to treatment, Monitor vital signs and CBGs as ordered, Perform/monitor CIWA, COWS, AIMS and Fall Risk screenings as ordered, Perform wound care treatments as ordered.  Evaluation of Outcomes: Progressing   LCSW Treatment Plan for Primary Diagnosis: <principal problem not specified> Long Term Goal(s): Safe transition to appropriate next level of care at discharge, Engage patient in therapeutic group addressing interpersonal concerns.  Short Term Goals: Engage patient in aftercare planning with referrals and resources, Increase social support, Increase emotional regulation, Facilitate patient progression through stages of change  regarding substance use diagnoses and concerns, Identify triggers associated with mental health/substance abuse issues and Increase skills for wellness and recovery  Therapeutic Interventions: Assess for all discharge needs, 1 to 1 time with Social worker, Explore available resources and support systems, Assess for adequacy in community support network, Educate family and significant other(s) on suicide prevention, Complete Psychosocial Assessment, Interpersonal group therapy.  Evaluation of Outcomes: Progressing   Progress in Treatment: Attending groups:  No. Participating in groups: No. Taking medication as prescribed: Yes. Toleration medication: Yes. Family/Significant other contact made: No, will contact:  supports if consent is granted Patient understands diagnosis: Yes. Discussing patient identified problems/goals with staff: Yes. Medical problems stabilized or resolved: No. Denies suicidal/homicidal ideation: No. Issues/concerns per patient self-inventory: Yes.  New problem(s) identified: No, Describe:  CSW continuing to assess  New Short Term/Long Term Goal(s): detox, medication management for mood stabilization; elimination of SI thoughts; development of comprehensive mental wellness/sobriety plan.  Patient Goals: Manage depression and anxiety; stop using Xanax.  Discharge Plan or Barriers: [place] pamphlet, Mobile Crisis information, and AA/NA information provided to patient for additional community support and resources.   Reason for Continuation of Hospitalization: Anxiety Depression Medication stabilization Suicidal ideation Withdrawal symptoms  Estimated Length of Stay: 3 days  Attendees: Patient: 11/19/2018 9:51 AM  Physician:  11/19/2018 9:51 AM  Nursing:  11/19/2018 9:51 AM  RN Care Manager: 11/19/2018 9:51 AM  Social Worker: Stephanie Acre, [place] 11/19/2018 9:51 AM  Recreational Therapist:  11/19/2018 9:51 AM  Other:  11/19/2018 9:51 AM  Other:  11/19/2018 9:51 AM  Other: 11/19/2018 9:51 AM    Scribe for Treatment Team: Joellen Jersey, [place] 11/19/2018 9:51 AM

## 2018-11-19 NOTE — Progress Notes (Signed)
NUTRITION ASSESSMENT  Pt identified as at risk on the Malnutrition Screen Tool  INTERVENTION: - Will change Ensure Enlive to once/day, this supplement provides 350 kcal and 20 grams of protein. - Will order Boost Breeze once/day, this supplement provides 250 kcal and 9 grams of protein. - Continue to encourage PO intakes.   NUTRITION DIAGNOSIS: Unintentional weight loss related to sub-optimal intake as evidenced by pt report.   Goal: Pt to meet >/= 90% of their estimated nutrition needs.  Monitor:  PO intake  Assessment:  Patient admitted for SI d/t severe migraine; plan to OD on heroin. Per chart review, current weight is 158 lb and weight on 09/17/18 was 168 lb indicating 10 lb weight loss (6% body weight) in the past 2 months. This is not significant for time frame.    58 y.o. male  Height: Ht Readings from Last 1 Encounters:  11/18/18 6' (1.829 m)    Weight: Wt Readings from Last 1 Encounters:  11/18/18 71.7 kg    Weight Hx: Wt Readings from Last 10 Encounters:  11/18/18 71.7 kg  11/18/18 79.4 kg  09/17/18 76.4 kg  05/15/18 81.6 kg  05/11/18 81.6 kg  01/29/18 80.3 kg  01/28/18 83.5 kg  01/22/18 86.2 kg  07/04/15 83 kg  06/20/15 83 kg    BMI:  Body mass index is 21.43 kg/m. Pt meets criteria for normal weight based on current BMI.  Estimated Nutritional Needs: Kcal: 25-30 kcal/kg Protein: > 1 gram protein/kg Fluid: 1 ml/kcal  Diet Order:  Diet Order            Diet regular Room service appropriate? Yes; Fluid consistency: Thin  Diet effective now             Pt is also offered choice of unit snacks mid-morning and mid-afternoon.  Pt is eating as desired.   Lab results and medications reviewed.     Jarome Matin, MS, RD, LDN, [facility] Inpatient Clinical Dietitian Pager # [phone] After hours/weekend pager # [phone]

## 2018-11-19 NOTE — Progress Notes (Signed)
D: Pt denies SI/HI/AVH. Pt is pleasant and cooperative. Pt stated after he got a shot earlier that his HA was a little better. Pt educated on medication . Pt encouraged to do other things to help reduce the pain, pt stated Cocaine worked, pt was informed that if Cocaine worked there was something else out there that could work also . Pt started shaking when taking his medications, pt tried to present if he was having worse withdrawal Sx than presented. When pt was observed , without pt knowing he was being watched,  sitting in the dayroom he did not appear to have the shakes .  A: Pt was offered support and encouragement. Pt was given scheduled medications. Pt was encourage to attend groups. Q 15 minute checks were done for safety.  R: safety maintained on unit.  Problem: Safety: Goal: Periods of time without injury will increase Outcome: Progressing

## 2018-11-20 NOTE — Plan of Care (Signed)
  Problem: Education: Goal: Knowledge of [place] [facility] Education information/materials will improve Outcome: Progressing   

## 2018-11-20 NOTE — Progress Notes (Signed)
D Patient is observed OOB UAL on the 300 hall today. He wears hospital-issue scrubs. He is flat, depressed and he he looks away- almost in shame when this writer looks at him. HE does not talk, shaking his head " yes" and /or "nop" in respnse to this writer's questions.     A HE did complete hisd ailya ssessment and on this he wrote he deneid having suicidal ideaiton today, and he rated his depression, hopelessness and anxeity " 5/5/2", respectively. HE requested and was given an IM for migraine pain this afternoon, and has been in his bed the remaindeer of the day. He says he experiences h=this head pain every day." Its just hell" he states.     R Safety has been in place. COntinue to support pt and monitor his status.

## 2018-11-20 NOTE — [facility] Group Notes (Signed)
LCSW Group Therapy Note  11/20/2018    10:00-11:00am   Type of Therapy and Topic:  Group Therapy: Early Messages Received About Anger  Participation Level:  Did Not Attend   Description of Group:   In this group, patients shared and discussed the early messages received in their lives about anger through parental or other adult modeling, teaching, repression, punishment, violence, and more.  Participants identified how those childhood lessons influence even now how they usually or often react when angered.  The group discussed that anger is a secondary emotion and what may be the underlying emotional themes that come out through anger outbursts or that are ignored through anger suppression.  Finally, as a group there was a conversation about the workbook's quote that "There is nothing wrong with anger; it is just a sign something needs to change."     Therapeutic Goals: 1. Patients will identify one or more childhood message about anger that they received and how it was taught to them. 2. Patients will discuss how these childhood experiences have influenced and continue to influence their own expression or repression of anger even today. 3. Patients will explore possible primary emotions that tend to fuel their secondary emotion of anger. 4. Patients will learn that anger itself is normal and cannot be eliminated, and that healthier coping skills can assist with resolving conflict rather than worsening situations.   Summary of Patient Progress:  N/A  Therapeutic Modalities:   Cognitive Behavioral Therapy Motivation Interviewing  Spencer Mendoza  .

## 2018-11-20 NOTE — Progress Notes (Addendum)
[facility] MD Progress Note  11/20/2018 11:10 AM Spencer Mendoza  MRN:  [id number]  Evaluated: Spencer Mendoza observe attending daily group sessions. Spencer Mendoza is awake alert and oriented x3, presents flat and guarded throughout this assessment.  Denies suicidal or homicidal ideations during this assessment.  However reports chronic depression as he rates his depression 8 out of 10 with 10 being the worst during this assessment. Spencer Mendoza isolated room most of the morning.  Reports ongoing pain as he indicated sleeping "helps me not feel."  Reports ingesting 48 xanxa tables "to end it all." reports some withdrawal symptoms.  Reports disability for chronic migraines.  Patient reports he will follow-up with social work for discharge disposition, however indicates he does not feel ready to discharge soon.  Reports a good appetite.  States he is resting well.  Support encouragement reassurance was provided.   History: per admission assessment note: [name] is a 58 year old patient who presented on a voluntary basis through the emergency department at 1 AM today, reporting that his migraines are so severe that it is prompted him to have suicidal thinking and plans, stating that when he got his disability check tomorrow on the third he would obtain enough heroin to overdose. He presented in March 2019 with a nearly identical presentation, stating he would overdose on $700 worth of heroin and had recently abused an excessive amount of cocaine as well in an attempt to end his life.  He had actually been discharged from a rehab facility [facility] in [place] and that was in February 2019 but relapsed immediately causing further stress within his family who would pay thousands of dollars for his rehab.  He was detoxed successfully on that admission in March and prescribed at discharge his Keppra, multivitamins, propranolol, fluoxetine, Imitrex and gabapentin he cannot tell me how long he stayed sober.  What is known is  he did present on this admission with a drug screen positive for benzodiazepines, cocaine, cannabis and a blood alcohol level of 81 stating he does need detox measures.  Does not know where the benzodiazepines came from.    Principal Problem: Principal problem remains 1 of chemical dependency patient in denial that this is primary Diagnosis: Active Problems:   MDD (major depressive disorder), recurrent severe, without psychosis ([place])  Total Time spent with patient: 20 minutes  Past Medical History:  Past Medical History:  Diagnosis Date  . Allergy   . Anxiety   . Bipolar disorder ([place])   . Chronic insomnia 06/06/2015  . Depression   . Epilepsy ([place])   . Hemiplegic migraine with status migrainosus 06/06/2015  . Hypertension   . Migraine   . Stroke [facility])    patient denies  . Substance abuse [facility])     Past Surgical History:  Procedure Laterality Date  . APPENDECTOMY    . KNEE ARTHROSCOPY Right   . NOSE SURGERY     Family History:  Family History  Problem Relation Age of Onset  . Heart disease Mother   . Stroke Mother   . Epilepsy Mother   . Heart disease Father   . Hypertension Father   . Melanoma Father   . Migraines Father   . Colon cancer Sister   . Colon cancer Cousin     Social History:  Social History   Substance and Sexual Activity  Alcohol Use Yes  . Alcohol/week: 6.0 standard drinks  . Types: 6 Cans of beer per week   Comment: pt reports no  ETOH and no durgs in 3 moths.      Social History   Substance and Sexual Activity  Drug Use Yes  . Frequency: 3.0 times per week  . Types: Marijuana, Heroin, Cocaine   Comment: crack, heroin pt reports no ETOH and no durgs in 3 moths.     Social History   Socioeconomic History  . Marital status: Divorced    Spouse name: Not on file  . Number of children: 0  . Years of education: GED  . Highest education level: Not on file  Occupational History  . Occupation: disabled  Social Needs  . Financial resource  strain: Not on file  . Food insecurity:    Worry: Not on file    Inability: Not on file  . Transportation needs:    Medical: Not on file    Non-medical: Not on file  Tobacco Use  . Smoking status: Current Every Day Smoker    Packs/day: 1.00    Years: 15.00    Pack years: 15.00    Types: Cigarettes  . Smokeless tobacco: Never Used  Substance and Sexual Activity  . Alcohol use: Yes    Alcohol/week: 6.0 standard drinks    Types: 6 Cans of beer per week    Comment: pt reports no ETOH and no durgs in 3 moths.   . Drug use: Yes    Frequency: 3.0 times per week    Types: Marijuana, Heroin, Cocaine    Comment: crack, heroin pt reports no ETOH and no durgs in 3 moths.   . Sexual activity: Yes    Birth control/protection: Condom  Lifestyle  . Physical activity:    Days per week: Not on file    Minutes per session: Not on file  . Stress: Not on file  Relationships  . Social connections:    Talks on phone: Not on file    Gets together: Not on file    Attends religious service: Not on file    Active member of club or organization: Not on file    Attends meetings of clubs or organizations: Not on file    Relationship status: Not on file  Other Topics Concern  . Not on file  Social History Narrative   Pt lives with sister.   Patient drinks 2 cups of caffeine daily.   Patient is right handed.   Additional Social History:                         Sleep: Fair  Appetite:  Fair  Current Medications: Current Facility-Administered Medications  Medication Dose Route Frequency Provider Last Rate Last Dose  . acetaminophen (TYLENOL) tablet 650 mg  650 mg Oral Q6H PRN Ethelene Hal, NP   650 mg at 11/19/18 1509  . albuterol (PROVENTIL HFA;VENTOLIN HFA) 108 (90 Base) MCG/ACT inhaler 2 puff  2 puff Inhalation Q4H PRN Ethelene Hal, NP      . alum & mag hydroxide-simeth (MAALOX/MYLANTA) 200-200-20 MG/5ML suspension 30 mL  30 mL Oral Q4H PRN Ethelene Hal,  NP      . chlordiazePOXIDE (LIBRIUM) capsule 25 mg  25 mg Oral Q6H PRN Johnn Hai, MD   25 mg at 11/19/18 1653  . feeding supplement (BOOST / RESOURCE BREEZE) liquid 1 Container  1 Container Oral Q24H Sharma Covert, MD   1 Container at 11/20/18 [phone]  . feeding supplement (ENSURE ENLIVE) (ENSURE ENLIVE) liquid 237 mL  237 mL Oral  Q24H Sharma Covert, MD      . FLUoxetine [facility]) capsule 20 mg  20 mg Oral Daily Sharma Covert, MD   20 mg at 11/20/18 [phone]  . folic acid (FOLVITE) tablet 1 mg  1 mg Oral Daily Sharma Covert, MD   1 mg at 11/20/18 [phone]  . gabapentin (NEURONTIN) capsule 300 mg  300 mg Oral TID Sharma Covert, MD   300 mg at 11/20/18 2248  . hydrOXYzine (ATARAX/VISTARIL) tablet 25 mg  25 mg Oral Q6H PRN Johnn Hai, MD   25 mg at 11/19/18 2120  . ketorolac (TORADOL) injection 60 mg  60 mg Intramuscular Daily PRN Johnn Hai, MD   60 mg at 11/19/18 1646  . levETIRAcetam (KEPPRA) tablet 500 mg  500 mg Oral BID Sharma Covert, MD   500 mg at 11/20/18 2500  . loperamide (IMODIUM) capsule 2-4 mg  2-4 mg Oral PRN Johnn Hai, MD      . magnesium hydroxide (MILK OF MAGNESIA) suspension 30 mL  30 mL Oral Daily PRN Ethelene Hal, NP   30 mL at 11/20/18 0625  . multivitamin with minerals tablet 1 tablet  1 tablet Oral Daily Sharma Covert, MD   1 tablet at 11/20/18 [phone]  . nicotine (NICODERM CQ - dosed in mg/24 hours) patch 21 mg  21 mg Transdermal Daily Sharma Covert, MD   21 mg at 11/20/18 8889  . ondansetron (ZOFRAN-ODT) disintegrating tablet 4 mg  4 mg Oral Q6H PRN Johnn Hai, MD      . thiamine (B-1) injection 100 mg  100 mg Intramuscular Once Johnn Hai, MD      . thiamine (VITAMIN B-1) tablet 100 mg  100 mg Oral Daily Johnn Hai, MD   100 mg at 11/20/18 1694  . traZODone (DESYREL) tablet 100 mg  100 mg Oral QHS PRN Johnn Hai, MD   100 mg at 11/19/18 2120    Lab Results:  No results found for this or any previous visit (from the past  59 hour(s)).  Blood Alcohol level:  Lab Results  Component Value Date   ETH 81 (H) 11/18/2018   ETH <10 50/38/8828    Metabolic Disorder Labs: Lab Results  Component Value Date   HGBA1C 4.9 02/01/2018   MPG 93.93 02/01/2018   MPG 97 12/05/2013   Lab Results  Component Value Date   PROLACTIN 15.2 02/01/2018   Lab Results  Component Value Date   CHOL 199 02/01/2018   TRIG 229 (H) 02/01/2018   HDL 55 02/01/2018   CHOLHDL 3.6 02/01/2018   VLDL 46 (H) 02/01/2018   LDLCALC 98 02/01/2018   LDLCALC 62 12/05/2013    Physical Findings: AIMS: Facial and Oral Movements Muscles of Facial Expression: None, normal Lips and Perioral Area: None, normal Jaw: None, normal Tongue: None, normal,Extremity Movements Upper (arms, wrists, hands, fingers): None, normal Lower (legs, knees, ankles, toes): None, normal, Trunk Movements Neck, shoulders, hips: None, normal, Overall Severity Severity of abnormal movements (highest score from questions above): None, normal Incapacitation due to abnormal movements: None, normal Patient's awareness of abnormal movements (rate only patient's report): No Awareness, Dental Status Current problems with teeth and/or dentures?: No Does patient usually wear dentures?: No  CIWA:  CIWA-Ar Total: 13 COWS:  COWS Total Score: 5  Musculoskeletal: Strength & Muscle Tone: decreased Gait & Station: shuffle Patient leans: N/A  Psychiatric Specialty Exam: Physical Exam  Nursing note and vitals reviewed. Constitutional: He appears well-developed.  Psychiatric: He has a normal mood and affect. His behavior is normal.    Review of Systems  Psychiatric/Behavioral: Positive for depression. Negative for suicidal ideas. The patient is nervous/anxious.   All other systems reviewed and are negative.   Blood pressure 109/79, pulse 80, temperature 97.6 F (36.4 C), resp. rate 18, height 6' (1.829 m), weight 71.7 kg, SpO2 98 %.Body mass index is 21.43 kg/m.   General Appearance: Casual paper scrubs   Eye Contact:  None  Speech:  Slurred  Volume:  Decreased  Mood:  Dysphoric  Affect:  Constricted  Thought Process:  Goal Directed  Orientation:  Full (Time, Place, and Person)  Thought Content:  Tangential  Suicidal Thoughts:  Yes.  with intent/plan  Homicidal Thoughts:  No  Memory:  Immediate;   Poor  Judgement:  Impaired  Insight:  Lacking  Psychomotor Activity:  Psychomotor Retardation  Concentration:  Concentration: Poor  Recall:  Poor  Fund of Knowledge:  Fair  Language:  Poor  Akathisia:  Negative  Handed:  Right  AIMS (if indicated):     Assets:  Leisure Time  ADL's:  Intact  Cognition:  WNL  Sleep:  Number of Hours: 6.75     Treatment Plan Summary: Daily contact with patient to assess and evaluate symptoms and progress in treatment and Medication management  Continue with current treatment plan on 11/20/2018 as listed below except where noted  Mood stabilization:   Continue with Prozac 20 mg Po daily   Continue Neurontin 300 mg po TID   Insomnia:    Continue with Trazodone 100 mg po QHS   Started on CWIA/ Librium Protocol  Will continue to monitor vitals ,medication compliance and treatment side effects while patient is here.  Reviewed labs:BAL - 81, UDS - pos for cocaine, thc and benzodizpines.  CSW will continue working on disposition.  Patient to participate in therapeutic milieu  Derrill Center, NP 11/20/2018, 11:10 AM

## 2018-11-20 NOTE — [facility] Group Notes (Signed)
[facility] Group Notes:  (Nursing)  Date:  11/20/2018  Time: 1:15 Pm Type of Therapy:  Nurse Education  Participation Level:  Active  Participation Quality:  Appropriate  Affect:  Appropriate  Cognitive:  Appropriate  Insight:  Appropriate  Engagement in Group:  Engaged  Modes of Intervention:  Education  Summary of Progress/Problems: Identifying Needs Life Skills group Spencer Mendoza 11/20/2018, 8:17 PM

## 2018-11-20 NOTE — Progress Notes (Signed)
D: Pt passive SI-contracts. Pt slept 6 hrs during the day. Pt visible minimally on unit this evening. A: Pt was offered support and encouragement. Pt was given scheduled medications. Pt was encourage to attend groups. Q 15 minute checks were done for safety.  R: safety maintained on unit.  Problem: Activity: Goal: Sleeping patterns will improve Outcome: Progressing   Problem: Safety: Goal: Periods of time without injury will increase Outcome: Progressing

## 2018-11-21 MED ORDER — QUETIAPINE FUMARATE 50 MG PO TABS
50.0000 mg | ORAL_TABLET | Freq: Every day | ORAL | Status: DC
  Administered 2018-11-21: 50 mg via ORAL
  Filled 2018-11-21 (×4): qty 1

## 2018-11-21 MED ORDER — FLUOXETINE HCL 20 MG PO CAPS
40.0000 mg | ORAL_CAPSULE | Freq: Every day | ORAL | Status: DC
  Administered 2018-11-22: 40 mg via ORAL
  Filled 2018-11-21 (×3): qty 2

## 2018-11-21 NOTE — Progress Notes (Signed)
[facility] MD Progress Note  11/21/2018 10:44 AM Spencer Mendoza  MRN:  [id number]  Evaluated: Spencer Mendoza continues to present flat and guarded. Reports " I have plans to do things differently this time around." reports attending group session with possible connection with a member that has been sober for the past 8 years and reported that the member is willing to work with him after discharge. Patient reports poor sleep due to headache pain. Reports he has a follow-up with his neurologist later this month. Patient denies suicidal or homicidal ideations. Denies auditory or visual hallucination. Spencer Mendoza rates his depression 7/10 with 10 being the worst. Discussed titration with Prozac 20 mg to 40 mg patient was agreeable with treatment plan. Support, encouragement and reassurance was provided.      History: per admission assessment note: Spencer Mendoza is a 58 year old patient who presented on a voluntary basis through the emergency department at 1 AM today, reporting that his migraines are so severe that it is prompted him to have suicidal thinking and plans, stating that when he got his disability check tomorrow on the third he would obtain enough heroin to overdose. He presented in March 2019 with a nearly identical presentation, stating he would overdose on $700 worth of heroin and had recently abused an excessive amount of cocaine as well in an attempt to end his life.  He had actually been discharged from a rehab facility [facility] in [place] and that was in February 2019 but relapsed immediately causing further stress within his family who would pay thousands of dollars for his rehab.  He was detoxed successfully on that admission in March and prescribed at discharge his Keppra, multivitamins, propranolol, fluoxetine, Imitrex and gabapentin he cannot tell me how long he stayed sober.  What is known is he did present on this admission with a drug screen positive for benzodiazepines, cocaine, cannabis  and a blood alcohol level of 81 stating he does need detox measures.  Does not know where the benzodiazepines came from.    Principal Problem: Principal problem remains 1 of chemical dependency patient in denial that this is primary Diagnosis: Active Problems:   MDD (major depressive disorder), recurrent severe, without psychosis ([place])  Total Time spent with patient: 20 minutes  Past Medical History:  Past Medical History:  Diagnosis Date  . Allergy   . Anxiety   . Bipolar disorder ([place])   . Chronic insomnia 06/06/2015  . Depression   . Epilepsy ([place])   . Hemiplegic migraine with status migrainosus 06/06/2015  . Hypertension   . Migraine   . Stroke [facility])    patient denies  . Substance abuse [facility])     Past Surgical History:  Procedure Laterality Date  . APPENDECTOMY    . KNEE ARTHROSCOPY Right   . NOSE SURGERY     Family History:  Family History  Problem Relation Age of Onset  . Heart disease Mother   . Stroke Mother   . Epilepsy Mother   . Heart disease Father   . Hypertension Father   . Melanoma Father   . Migraines Father   . Colon cancer Sister   . Colon cancer Cousin     Social History:  Social History   Substance and Sexual Activity  Alcohol Use Yes  . Alcohol/week: 6.0 standard drinks  . Types: 6 Cans of beer per week   Comment: pt reports no ETOH and no durgs in 3 moths.      Social History  Substance and Sexual Activity  Drug Use Yes  . Frequency: 3.0 times per week  . Types: Marijuana, Heroin, Cocaine   Comment: crack, heroin pt reports no ETOH and no durgs in 3 moths.     Social History   Socioeconomic History  . Marital status: Divorced    Spouse name: Not on file  . Number of children: 0  . Years of education: GED  . Highest education level: Not on file  Occupational History  . Occupation: disabled  Social Needs  . Financial resource strain: Not on file  . Food insecurity:    Worry: Not on file    Inability: Not on file  .  Transportation needs:    Medical: Not on file    Non-medical: Not on file  Tobacco Use  . Smoking status: Current Every Day Smoker    Packs/day: 1.00    Years: 15.00    Pack years: 15.00    Types: Cigarettes  . Smokeless tobacco: Never Used  Substance and Sexual Activity  . Alcohol use: Yes    Alcohol/week: 6.0 standard drinks    Types: 6 Cans of beer per week    Comment: pt reports no ETOH and no durgs in 3 moths.   . Drug use: Yes    Frequency: 3.0 times per week    Types: Marijuana, Heroin, Cocaine    Comment: crack, heroin pt reports no ETOH and no durgs in 3 moths.   . Sexual activity: Yes    Birth control/protection: Condom  Lifestyle  . Physical activity:    Days per week: Not on file    Minutes per session: Not on file  . Stress: Not on file  Relationships  . Social connections:    Talks on phone: Not on file    Gets together: Not on file    Attends religious service: Not on file    Active member of club or organization: Not on file    Attends meetings of clubs or organizations: Not on file    Relationship status: Not on file  Other Topics Concern  . Not on file  Social History Narrative   Pt lives with sister.   Patient drinks 2 cups of caffeine daily.   Patient is right handed.   Additional Social History:                         Sleep: Fair  Appetite:  Fair  Current Medications: Current Facility-Administered Medications  Medication Dose Route Frequency Provider Last Rate Last Dose  . acetaminophen (TYLENOL) tablet 650 mg  650 mg Oral Q6H PRN Ethelene Hal, NP   650 mg at 11/19/18 1509  . albuterol (PROVENTIL HFA;VENTOLIN HFA) 108 (90 Base) MCG/ACT inhaler 2 puff  2 puff Inhalation Q4H PRN Ethelene Hal, NP      . alum & mag hydroxide-simeth (MAALOX/MYLANTA) 200-200-20 MG/5ML suspension 30 mL  30 mL Oral Q4H PRN Ethelene Hal, NP      . chlordiazePOXIDE (LIBRIUM) capsule 25 mg  25 mg Oral Q6H PRN Johnn Hai, MD   25  mg at 11/19/18 1653  . feeding supplement (BOOST / RESOURCE BREEZE) liquid 1 Container  1 Container Oral Q24H Sharma Covert, MD   1 Container at 11/21/18 [phone]  . feeding supplement (ENSURE ENLIVE) (ENSURE ENLIVE) liquid 237 mL  237 mL Oral Q24H Sharma Covert, MD   237 mL at 11/20/18 1436  . FLUoxetine (PROZAC)  capsule 20 mg  20 mg Oral Daily Sharma Covert, MD   20 mg at 11/21/18 0830  . folic acid (FOLVITE) tablet 1 mg  1 mg Oral Daily Sharma Covert, MD   1 mg at 11/21/18 0831  . gabapentin (NEURONTIN) capsule 300 mg  300 mg Oral TID Sharma Covert, MD   300 mg at 11/21/18 0831  . hydrOXYzine (ATARAX/VISTARIL) tablet 25 mg  25 mg Oral Q6H PRN Johnn Hai, MD   25 mg at 11/20/18 2322  . ketorolac (TORADOL) injection 60 mg  60 mg Intramuscular Daily PRN Johnn Hai, MD   60 mg at 11/20/18 1526  . levETIRAcetam (KEPPRA) tablet 500 mg  500 mg Oral BID Sharma Covert, MD   500 mg at 11/21/18 0831  . loperamide (IMODIUM) capsule 2-4 mg  2-4 mg Oral PRN Johnn Hai, MD      . magnesium hydroxide (MILK OF MAGNESIA) suspension 30 mL  30 mL Oral Daily PRN Ethelene Hal, NP   30 mL at 11/20/18 0625  . multivitamin with minerals tablet 1 tablet  1 tablet Oral Daily Sharma Covert, MD   1 tablet at 11/21/18 [phone]  . nicotine (NICODERM CQ - dosed in mg/24 hours) patch 21 mg  21 mg Transdermal Daily Sharma Covert, MD   21 mg at 11/21/18 0831  . ondansetron (ZOFRAN-ODT) disintegrating tablet 4 mg  4 mg Oral Q6H PRN Johnn Hai, MD      . thiamine (B-1) injection 100 mg  100 mg Intramuscular Once Johnn Hai, MD      . thiamine (VITAMIN B-1) tablet 100 mg  100 mg Oral Daily Johnn Hai, MD   100 mg at 11/21/18 0831  . traZODone (DESYREL) tablet 100 mg  100 mg Oral QHS PRN Johnn Hai, MD   100 mg at 11/20/18 2322    Lab Results:  No results found for this or any previous visit (from the past 39 hour(s)).  Blood Alcohol level:  Lab Results  Component Value  Date   ETH 81 (H) 11/18/2018   ETH <10 09/62/8366    Metabolic Disorder Labs: Lab Results  Component Value Date   HGBA1C 4.9 02/01/2018   MPG 93.93 02/01/2018   MPG 97 12/05/2013   Lab Results  Component Value Date   PROLACTIN 15.2 02/01/2018   Lab Results  Component Value Date   CHOL 199 02/01/2018   TRIG 229 (H) 02/01/2018   HDL 55 02/01/2018   CHOLHDL 3.6 02/01/2018   VLDL 46 (H) 02/01/2018   LDLCALC 98 02/01/2018   LDLCALC 62 12/05/2013    Physical Findings: AIMS: Facial and Oral Movements Muscles of Facial Expression: None, normal Lips and Perioral Area: None, normal Jaw: None, normal Tongue: None, normal,Extremity Movements Upper (arms, wrists, hands, fingers): None, normal Lower (legs, knees, ankles, toes): None, normal, Trunk Movements Neck, shoulders, hips: None, normal, Overall Severity Severity of abnormal movements (highest score from questions above): None, normal Incapacitation due to abnormal movements: None, normal Patient's awareness of abnormal movements (rate only patient's report): No Awareness, Dental Status Current problems with teeth and/or dentures?: No Does patient usually wear dentures?: No  CIWA:  CIWA-Ar Total: 5 COWS:  COWS Total Score: 5  Musculoskeletal: Strength & Muscle Tone: decreased Gait & Station: shuffle Patient leans: N/A  Psychiatric Specialty Exam: Physical Exam  Nursing note and vitals reviewed. Constitutional: He appears well-developed.  Psychiatric: He has a normal mood and affect. His behavior is normal.  Review of Systems  Psychiatric/Behavioral: Positive for depression. Negative for suicidal ideas. The patient is nervous/anxious.   All other systems reviewed and are negative.   Blood pressure (!) 125/92, pulse (!) 57, temperature 97.6 F (36.4 C), resp. rate 18, height 6' (1.829 m), weight 71.7 kg, SpO2 98 %.Body mass index is 21.43 kg/m.  General Appearance: Casual paper scrubs   Eye Contact:  None   Speech:  Slurred  Volume:  Decreased  Mood:  Dysphoric  Affect:  Constricted  Thought Process:  Goal Directed  Orientation:  Full (Time, Place, and Person)  Thought Content:  Tangential  Suicidal Thoughts:  Yes.  with intent/plan  Homicidal Thoughts:  No  Memory:  Immediate;   Poor  Judgement:  Impaired  Insight:  Lacking  Psychomotor Activity:  Psychomotor Retardation  Concentration:  Concentration: Poor  Recall:  Poor  Fund of Knowledge:  Fair  Language:  Poor  Akathisia:  Negative  Handed:  Right  AIMS (if indicated):     Assets:  Leisure Time  ADL's:  Intact  Cognition:  WNL  Sleep:  Number of Hours: 6.25     Treatment Plan Summary: Daily contact with patient to assess and evaluate symptoms and progress in treatment and Medication management  Continue with current treatment plan on 11/21/2018 as listed below except where noted  Mood stabilization:   Increased  with Prozac 20 to 40 mg  mg Po daily   Continue Neurontin 300 mg po TID   Insomnia:    Continue with Trazodone 100 mg po QHS   Started on CWIA/ Librium Protocol  Will continue to monitor vitals ,medication compliance and treatment side effects while patient is here.  Reviewed labs:BAL - 81, UDS - pos for cocaine, thc and benzodizpines.  CSW will continue working on disposition.  Patient to participate in therapeutic milieu  Derrill [place], NP 11/21/2018, 10:44 AM

## 2018-11-21 NOTE — Progress Notes (Signed)
Patient did attend the evening speaker AA meeting.  

## 2018-11-21 NOTE — [facility] Group Notes (Signed)
[facility] LCSW Group Therapy Note  11/21/2018  10:00-11:00AM  Type of Therapy and Topic:  Group Therapy:  Adding Supports Including Being Your Own Support  Participation Level:  Active   Description of Group:  Patients in this group were introduced to the concept that additional supports including self-support are an essential part of recovery.  A song entitled "Breaking Thin" was played and a group discussion was held in reaction to the idea of needing to add supports.  A song entitled "My Own Hero" was played and a group discussion ensued in which patients stated they could relate to the song and it inspired them to realize they have be willing to help themselves in order to succeed, because other people cannot achieve sobriety or stability for them.  We discussed adding a variety of healthy supports to address the various needs in their lives.    Therapeutic Goals: 1)  demonstrate the importance of being a part of one's own support system 2)  discuss reasons people in one's life may eventually be unable to be continually supportive  3)  identify the patient's current support system and   4)  elicit commitments to add healthy supports and to become more conscious of being self-supportive   Summary of Patient Progress:  The patient expressed little during group unless called on directly.  He appeared to be profoundly depressed.  He talked about the problems with his migraine headaches and severe headaches on other days, and said when he is referred to pain management, they are going to put him on narcotics for pain and this will just activate his addiction.  As a result, he believes he will simply start chasing a high that he will never be able to attain, so this is not a helpful solution.  Therapeutic Modalities:   Motivational Interviewing Activity  Maretta Los

## 2018-11-21 NOTE — [facility] Group Notes (Signed)
[facility] Group Notes:  (Nursing  Date:  11/21/2018  Time: 1:15 PM Type of Therapy:  Nurse Education  Participation Level:  Active  Participation Quality:  Appropriate  Affect:  Appropriate  Cognitive:  Alert and Appropriate  Insight:  Appropriate  Engagement in Group:  Engaged  Modes of Intervention:  Education  Summary of Progress/Problems: Healthy Supports Group Spencer Mendoza 11/21/2018, 5:56 PM

## 2018-11-21 NOTE — Plan of Care (Signed)
Progress note  D: pt found in bed; compliant with medication administration. Pt states he slept poorly. Pt rates his depression/hopelessness/anxiety a 7/8/3 out of 10 respectively. Pt has complaints of tremors, irritability and a migraine that he rates at an 8/10. Pt states his goal for today is to make it til the 16th to go to DR. Pt will achieve this by staying alive. Pt denies any si/hi/ah/vh and verbally agrees to approach staff if these become apparent or before harming himself or others while at [facility].  A: pt provided support and encouragement. Pt given medication per protocol and standing orders. Q3m safety checks implemented and continued.  R: pt safe on the unit. Will continue to monitor.   Pt progressing in the following metrics  Problem: Coping: Goal: Ability to demonstrate self-control will improve Outcome: Progressing   Problem: Health Behavior/Discharge Planning: Goal: Compliance with treatment plan for underlying cause of condition will improve Outcome: Progressing   Problem: Physical Regulation: Goal: Ability to maintain clinical measurements within normal limits will improve Outcome: Progressing   Problem: Education: Goal: Utilization of techniques to improve thought processes will improve Outcome: Progressing

## 2018-11-22 MED ORDER — GABAPENTIN 300 MG PO CAPS: 300.0000 mg | ORAL_CAPSULE | Freq: Three times a day (TID) | ORAL | 2 refills | Status: DC

## 2018-11-22 MED ORDER — QUETIAPINE FUMARATE 50 MG PO TABS: 50.0000 mg | ORAL_TABLET | Freq: Every day | ORAL | 2 refills | Status: DC

## 2018-11-22 NOTE — [facility] Suicide Risk Assessment (Signed)
[facility] INPATIENT:  Family/Significant Other Suicide Prevention Education  Suicide Prevention Education:  Patient Refusal for Family/Significant Other Suicide Prevention Education: The patient Spencer Mendoza has refused to provide written consent for family/significant other to be provided Family/Significant Other Suicide Prevention Education during admission and/or prior to discharge.  Physician notified.  SPE completed with pt, as pt refused to consent to family contact. SPI pamphlet provided to pt and pt was encouraged to share information with support network, ask questions, and talk about any concerns relating to SPE. Pt denies access to guns/firearms and verbalized understanding of information provided. Mobile Crisis information also provided to pt.   Avelina Laine LCSW 11/22/2018, 9:01 AM

## 2018-11-22 NOTE — Progress Notes (Signed)
  [facility] Adult Case Management Discharge Plan :  Will you be returning to the same living situation after discharge:  Yes,  home At discharge, do you have transportation home?: Yes,  bus pass provided. Do you have the ability to pay for your medications: Yes,  [employer] medicare  Release of information consent forms completed and submitted to medical records by CSW.   Patient to Follow up at: Follow-up Information    N/A Follow up.   Why:  Patient declined all referrals.         Pt continues to decline all referrals offered.   Next level of care provider has access to [place] and Suicide Prevention discussed: Yes,  SPE completed with pt; pt declined to consent to collateral contact. SPI pamphlet and mobile Crisis information provided to pt.   Have you used any form of tobacco in the last 30 days? (Cigarettes, Smokeless Tobacco, Cigars, and/or Pipes): Yes  Has patient been referred to the Quitline?: Patient refused referral  Patient has been referred for addiction treatment: Pt. refused referral  Avelina Laine, LCSW 11/22/2018, 9:01 AM

## 2018-11-22 NOTE — Progress Notes (Signed)
Patient states the addition of seroquel 50mg  helped with sleep last night. Encouraged to update provider today when seen. Patient verbalized understanding.

## 2018-11-22 NOTE — Progress Notes (Signed)
Recreation Therapy Notes  Date: 1.6.20 Time: 0930 Location: 300 Hall Dayroom  Group Topic: Stress Management  Goal Area(s) Addresses:  Patient will engage in healthy stress management techniques. Patient will be able to identify positive stress management techniques.  Intervention: Stress Management  Activity : Meditation.  LRT introduced the stress management technique of meditation.  LRT played a meditation that focused on looking at each day as a new beginning.  Patients were to follow along as meditation was played in order to engage in activity.  Education:  Stress Management, Discharge Planning.   Education Outcome: Acknowledges Education  Clinical Observations/Feedback: Pt did not attend group.    Victorino Sparrow, LRT/CTRS         Ria Comment, Marik Sedore A 11/22/2018 10:59 AM

## 2018-11-22 NOTE — Progress Notes (Signed)
D: Patient observed in milieu this evening. Flat, sad and depressed in affect, mood. Cautious on approach however pleasant and cooperative. States  his sleep was quite poor last night even with the trazadone. States he reported this to provider today however chart does not reflect any change in sleep medication orders. Denies pain, physical complaints. CIWA is a "2" for anxiety, VSS.  A: Medicated per orders, prn trazadone given for sleep and Adora Fridge, NP consulted regarding patient's poor sleep. Order received for seroquel 50mg  which was given. Medication education provided. Level III obs in place for safety. Emotional support offered. Patient encouraged to complete Suicide Safety Plan before discharge. Encouraged to attend and participate in unit programming.  Fall prevention plan in place and reviewed with patient as pt is a high fall risk due to seizure disorder, detox protocol.   R: Patient verbalizes understanding of POC, falls prevention education. On reassess, patient was asleep. Patient denies HI/AVH however endorses passive SI. Verbal contract in place for safety and remains safe on level III obs. Will continue to monitor throughout the night.

## 2018-11-22 NOTE — Discharge Summary (Signed)
Physician Discharge Summary Note  Patient:  RHYKER SILVERSMITH is an 58 y.o., male MRN:  [id number] DOB:  09-12-1961 Patient phone:  [phone] (home)  Patient address:   [address] [place] [address],  Total Time spent with patient: 30 minutes  Date of Admission:  11/18/2018 Date of Discharge: 11/22/18  Reason for Admission:  History of Present Illness:   [name] is a 58 year old patient who presented on a voluntary basis through the emergency department at 1 AM today, reporting that his migraines are so severe that it is prompted him to have suicidal thinking and plans, stating that when he got his disability check tomorrow on the third he would obtain enough heroin to overdose.  He presented in March 2019 with a nearly identical presentation, stating he would overdose on $700 worth of heroin and had recently abused an excessive amount of cocaine as well in an attempt to end his life.  He had actually been discharged from a rehab facility [facility] in [place] and that was in February 2019 but relapsed immediately causing further stress within his family who would pay thousands of dollars for his rehab.    He was detoxed successfully on that admission in March and prescribed at discharge his Keppra, multivitamins, propranolol, fluoxetine, Imitrex and gabapentin he cannot tell me how long he stayed sober.  What is known is he did present on this admission with a drug screen positive for benzodiazepines, cocaine, cannabis and a blood alcohol level of 81 stating he does need detox measures.  Does not know where the benzodiazepines came from.  When I interviewed the patient he has very guarded he makes no eye contact states he has a migraine and cannot talk but tells me an identical story that he is hoping to overdose when he gets his disability check because he cannot take the pain anymore and no medications help, when I listen numerous medications that are  used for acute and prophylactic migraine therapy states "that does not work" to every medication even fictitious medications.  Despite his despondency and inability to acknowledge any medication will be helpful, does contract for safety while here.  Denies auditory visual [place] and denies thoughts of harming others  [name]'s behavioral health assessment reads as follows Bradie Lacock Lowdermilkis an 58 y.o.malewho presents to the ED voluntarily.Pt reports he has been increasingly depressed and suicidal and has a plan to OD on heroin. Pt states he gets paid from disability on 11/19/18 and he plans to use his check to purchase a large enough amount of heroin to OD. Pt states he is suicidal due to constant migraine headaches. Pt states the pain is unbearable and he has no desire to live. Pt states he has been depressed for years and using substances to cope. Pt states he uses cannabis because that is the only thing that relieves his migraine. Pt states he has been admitted to multiple inpt facilities due to depression, SI, and substance abuse. Pt is tearful and slumped over during the assessment. Pt states "I can't take this anymore. I just feel so sick all the time. Nothing ever helps. I can't do this." Pt is unable to contract for safety.   Principal Problem: MDD (major depressive disorder), recurrent severe, without psychosis ([place]) Discharge Diagnoses: Principal Problem:   MDD (major depressive disorder), recurrent severe, without psychosis ([place])  Past Medical History:  Past Medical History:  Diagnosis Date  . Allergy   . Anxiety   .  Bipolar disorder ([place])   . Chronic insomnia 06/06/2015  . Depression   . Epilepsy ([place])   . Hemiplegic migraine with status migrainosus 06/06/2015  . Hypertension   . Migraine   . Stroke [facility])    patient denies  . Substance abuse [facility])     Past Surgical History:  Procedure Laterality Date  . APPENDECTOMY    . KNEE ARTHROSCOPY Right   . NOSE  SURGERY     Family History:  Family History  Problem Relation Age of Onset  . Heart disease Mother   . Stroke Mother   . Epilepsy Mother   . Heart disease Father   . Hypertension Father   . Melanoma Father   . Migraines Father   . Colon cancer Sister   . Colon cancer Cousin     Social History:  Social History   Substance and Sexual Activity  Alcohol Use Yes  . Alcohol/week: 6.0 standard drinks  . Types: 6 Cans of beer per week   Comment: pt reports no ETOH and no durgs in 3 moths.      Social History   Substance and Sexual Activity  Drug Use Yes  . Frequency: 3.0 times per week  . Types: Marijuana, Heroin, Cocaine   Comment: crack, heroin pt reports no ETOH and no durgs in 3 moths.     Social History   Socioeconomic History  . Marital status: Divorced    Spouse name: Not on file  . Number of children: 0  . Years of education: GED  . Highest education level: Not on file  Occupational History  . Occupation: disabled  Social Needs  . Financial resource strain: Not on file  . Food insecurity:    Worry: Not on file    Inability: Not on file  . Transportation needs:    Medical: Not on file    Non-medical: Not on file  Tobacco Use  . Smoking status: Current Every Day Smoker    Packs/day: 1.00    Years: 15.00    Pack years: 15.00    Types: Cigarettes  . Smokeless tobacco: Never Used  Substance and Sexual Activity  . Alcohol use: Yes    Alcohol/week: 6.0 standard drinks    Types: 6 Cans of beer per week    Comment: pt reports no ETOH and no durgs in 3 moths.   . Drug use: Yes    Frequency: 3.0 times per week    Types: Marijuana, Heroin, Cocaine    Comment: crack, heroin pt reports no ETOH and no durgs in 3 moths.   . Sexual activity: Yes    Birth control/protection: Condom  Lifestyle  . Physical activity:    Days per week: Not on file    Minutes per session: Not on file  . Stress: Not on file  Relationships  . Social connections:    Talks on phone:  Not on file    Gets together: Not on file    Attends religious service: Not on file    Active member of club or organization: Not on file    Attends meetings of clubs or organizations: Not on file    Relationship status: Not on file  Other Topics Concern  . Not on file  Social History Narrative   Pt lives with sister.   Patient drinks 2 cups of caffeine daily.   Patient is right handed.    Hospital Course:   Once here the patient remained generally resistant to the  idea that chemical dependency was his main issue he was asked intensively focused on migraine headaches, his disability from them, and no matter which medication we discussed to keep away migraine headaches he insisted it had been tried and was not effective.  The second hospital day was about the same in bed stating he was suicidal because of his headaches but he had a better weekend on Saturday and Sunday he tended to turn the corner as far as mood and affect by the date of the sixth I found to be alert oriented to person place time situation stating his headache had dissipated and he was eager to go home.  He stated he had a neurology appointment.  Once again he resisted the idea that chemical dependency was his main issue and he understands he would not be given opiates Again blood alcohol level was 81 on presentation, drug screen reflected abuse of cocaine and cannabis in addition to taking Xanax he is obtaining in the [place] area Physical Findings: AIMS: Facial and Oral Movements Muscles of Facial Expression: None, normal Lips and Perioral Area: None, normal Jaw: None, normal Tongue: None, normal,Extremity Movements Upper (arms, wrists, hands, fingers): None, normal Lower (legs, knees, ankles, toes): None, normal, Trunk Movements Neck, shoulders, hips: None, normal, Overall Severity Severity of abnormal movements (highest score from questions above): None, normal Incapacitation due to abnormal movements: None,  normal Patient's awareness of abnormal movements (rate only patient's report): No Awareness, Dental Status Current problems with teeth and/or dentures?: No Does patient usually wear dentures?: No  CIWA:  CIWA-Ar Total: 2 COWS:  COWS Total Score: 5  Musculoskeletal: Strength & Muscle Tone: within normal limits Gait & Station: normal Patient leans: N/A  Psychiatric Specialty Exam: Physical Exam  ROS  Blood pressure 111/78, pulse 77, temperature (!) 97.3 F (36.3 C), resp. rate 18, height 6' (1.829 m), weight 71.7 kg, SpO2 100 %.Body mass index is 21.43 kg/m.  General Appearance: Casual  Eye Contact:  Minimal  Speech:  Normal Rate  Volume:  Decreased  Mood:  Dysphoric  Affect:  Blunt  Thought Process:  Goal Directed  Orientation:  Full (Time, Place, and Person)  Thought Content:  Logical  Suicidal Thoughts:  No  Homicidal Thoughts:  No  Memory:  Immediate;   Fair  Judgement:  Fair  Insight:  Lacking  Psychomotor Activity:  Normal  Concentration:  Concentration: Fair  Recall:  [place] of Knowledge:  Fair  Language:  Good  Akathisia:  Negative  Handed:  Right  AIMS (if indicated):     Assets:  Communication Skills  ADL's:  Intact  Cognition:  WNL  Sleep:  Number of Hours: 5.5     Have you used any form of tobacco in the last 30 days? (Cigarettes, Smokeless Tobacco, Cigars, and/or Pipes): Yes  Has this patient used any form of tobacco in the last 30 days? (Cigarettes, Smokeless Tobacco, Cigars, and/or Pipes) Yes, No  Blood Alcohol level:  Lab Results  Component Value Date   ETH 81 (H) 11/18/2018   ETH <10 36/64/4034    Metabolic Disorder Labs:  Lab Results  Component Value Date   HGBA1C 4.9 02/01/2018   MPG 93.93 02/01/2018   MPG 97 12/05/2013   Lab Results  Component Value Date   PROLACTIN 15.2 02/01/2018   Lab Results  Component Value Date   CHOL 199 02/01/2018   TRIG 229 (H) 02/01/2018   HDL 55 02/01/2018   CHOLHDL 3.6 02/01/2018   VLDL  46 (H)  02/01/2018   [place] 98 02/01/2018   [place] 62 12/05/2013    See Psychiatric Specialty Exam and Suicide Risk Assessment completed by Attending Physician prior to discharge.  Discharge destination:  Home  Is patient on multiple antipsychotic therapies at discharge:  No   Has Patient had three or more failed trials of antipsychotic monotherapy by history:  No  Recommended Plan for Multiple Antipsychotic Therapies: NA   Allergies as of 11/22/2018      Reactions   Codeine Nausea And Vomiting   Depakote [divalproex Sodium] Other (See Comments)   Made patient shake   Migranal [dihydroergotamine] Nausea And Vomiting   Topamax [topiramate] Other (See Comments)   Made patient angry      Medication List    STOP taking these medications   ALPRAZolam 0.5 MG tablet Commonly known as:  XANAX     TAKE these medications     Indication  albuterol 108 (90 Base) MCG/ACT inhaler Commonly known as:  PROVENTIL HFA;VENTOLIN HFA Inhale 2 puffs into the lungs every 4 (four) hours as needed for shortness of breath or wheezing.  Indication:  Asthma   FLUoxetine 20 MG capsule Commonly known as:  PROZAC Take 1 capsule (20 mg total) by mouth daily.  Indication:  Major Depressive Disorder   gabapentin 300 MG capsule Commonly known as:  NEURONTIN Take 1 capsule (300 mg total) by mouth 3 (three) times daily.  Indication:  Abuse or Misuse of Alcohol   levETIRAcetam 500 MG tablet Commonly known as:  KEPPRA Take 1 tablet (500 mg total) by mouth 2 (two) times daily.  Indication:  Seizure   multivitamin with minerals Tabs tablet Take 1 tablet by mouth daily.  Indication:  Nutritional Support   propranolol 20 MG tablet Commonly known as:  INDERAL Take 1 tablet (20 mg total) by mouth 2 (two) times daily.  Indication:  Hypertension   QUEtiapine 50 MG tablet Commonly known as:  SEROQUEL Take 1 tablet (50 mg total) by mouth at bedtime.  Indication:  Agitation      Follow-up Information     N/A Follow up.   Why:  Patient declined all referrals.           Follow-up recommendations:  Activity:  full  Comments:    In general patient is stable for discharge home however will no doubt relapse with regards to chemical dependency issues and has no desire to stop alprazolam, cannabis so forth.  SignedJohnn Hai, MD 11/22/2018, 9:27 AM

## 2018-11-22 NOTE — Plan of Care (Signed)
Discharge note  Patient verbalizes readiness for discharge. Follow up plan explained, AVS, Transition record and SRA given. Prescriptions and teaching provided. Belongings returned and signed for. Suicide safety plan completed and signed. Patient verbalizes understanding. Patient denies SI/HI and assures this Probation officer he will seek assistance should that change. Patient discharged to lobby where sister was waiting.  Problem: Education: Goal: Knowledge of [place] General Education information/materials will improve Outcome: Adequate for Discharge Goal: Emotional status will improve Outcome: Adequate for Discharge Goal: Mental status will improve Outcome: Adequate for Discharge Goal: Verbalization of understanding the information provided will improve Outcome: Adequate for Discharge   Problem: Activity: Goal: Interest or engagement in activities will improve Outcome: Adequate for Discharge Goal: Sleeping patterns will improve Outcome: Adequate for Discharge   Problem: Coping: Goal: Ability to verbalize frustrations and anger appropriately will improve Outcome: Adequate for Discharge Goal: Ability to demonstrate self-control will improve Outcome: Adequate for Discharge   Problem: Health Behavior/Discharge Planning: Goal: Identification of resources available to assist in meeting health care needs will improve Outcome: Adequate for Discharge Goal: Compliance with treatment plan for underlying cause of condition will improve Outcome: Adequate for Discharge   Problem: Physical Regulation: Goal: Ability to maintain clinical measurements within normal limits will improve Outcome: Adequate for Discharge   Problem: Safety: Goal: Periods of time without injury will increase Outcome: Adequate for Discharge   Problem: Education: Goal: Utilization of techniques to improve thought processes will improve Outcome: Adequate for Discharge Goal: Knowledge of the prescribed therapeutic regimen  will improve Outcome: Adequate for Discharge   Problem: Activity: Goal: Interest or engagement in leisure activities will improve Outcome: Adequate for Discharge Goal: Imbalance in normal sleep/wake cycle will improve Outcome: Adequate for Discharge   Problem: Coping: Goal: Coping ability will improve Outcome: Adequate for Discharge Goal: Will verbalize feelings Outcome: Adequate for Discharge   Problem: Health Behavior/Discharge Planning: Goal: Ability to make decisions will improve Outcome: Adequate for Discharge Goal: Compliance with therapeutic regimen will improve Outcome: Adequate for Discharge   Problem: Role Relationship: Goal: Will demonstrate positive changes in social behaviors and relationships Outcome: Adequate for Discharge   Problem: Safety: Goal: Ability to disclose and discuss suicidal ideas will improve Outcome: Adequate for Discharge Goal: Ability to identify and utilize support systems that promote safety will improve Outcome: Adequate for Discharge   Problem: Self-Concept: Goal: Will verbalize positive feelings about self Outcome: Adequate for Discharge Goal: Level of anxiety will decrease Outcome: Adequate for Discharge   Problem: Education: Goal: Ability to state activities that reduce stress will improve Outcome: Adequate for Discharge   Problem: Coping: Goal: Ability to identify and develop effective coping behavior will improve Outcome: Adequate for Discharge   Problem: Self-Concept: Goal: Ability to identify factors that promote anxiety will improve Outcome: Adequate for Discharge Goal: Level of anxiety will decrease Outcome: Adequate for Discharge Goal: Ability to modify response to factors that promote anxiety will improve Outcome: Adequate for Discharge   Problem: Education: Goal: Knowledge of disease or condition will improve Outcome: Adequate for Discharge Goal: Understanding of discharge needs will improve Outcome: Adequate for  Discharge   Problem: Health Behavior/Discharge Planning: Goal: Ability to identify changes in lifestyle to reduce recurrence of condition will improve Outcome: Adequate for Discharge Goal: Identification of resources available to assist in meeting health care needs will improve Outcome: Adequate for Discharge   Problem: Physical Regulation: Goal: Complications related to the disease process, condition or treatment will be avoided or minimized Outcome: Adequate for Discharge  Problem: Safety: Goal: Ability to remain free from injury will improve Outcome: Adequate for Discharge

## 2018-11-22 NOTE — BHH Suicide Risk Assessment (Signed)
[facility] Discharge Suicide Risk Assessment   Principal Problem: MDD (major depressive disorder), recurrent severe, without psychosis ([place]) Discharge Diagnoses: Principal Problem:   MDD (major depressive disorder), recurrent severe, without psychosis ([place])  Current mental status exam-alert oriented to person place time day and situation affect constricted mood dysphoric but denies wanting to harm self and can contract if he leaves here.  Denies psychotic symptoms. Total Time spent with patient: 30 minutes Mental Status Per Nursing Assessment::   On Admission:  Suicidal ideation indicated by patient, Suicide plan, Self-harm thoughts, Plan includes specific time, place, or method, Intention to act on suicide plan, Belief that plan would result in death  Demographic Factors:  Male and Caucasian  Loss Factors: Decrease in vocational status and Decline in physical health  Historical Factors: NA  Risk Reduction Factors:   Religious beliefs about death  Continued Clinical Symptoms:  Alcohol/Substance Abuse/Dependencies  Cognitive Features That Contribute To Risk:  Closed-mindedness    Suicide Risk:  Mild:  Suicidal ideation of limited frequency, intensity, duration, and specificity.  There are no identifiable plans, no associated intent, mild dysphoria and related symptoms, good self-control (both objective and subjective assessment), few other risk factors, and identifiable protective factors, including available and accessible social support.  Follow-up Information    N/A Follow up.   Why:  Patient declined all referrals.           Plan Of Care/Follow-up recommendations:  Activity:  full  Sahithi Ordoyne, MD 11/22/2018, 9:23 AM

## 2018-12-02 ENCOUNTER — Encounter: Payer: Self-pay | Admitting: Neurology

## 2018-12-02 ENCOUNTER — Other Ambulatory Visit: Payer: Self-pay | Admitting: Neurology

## 2018-12-02 ENCOUNTER — Ambulatory Visit: Payer: Medicare HMO | Admitting: Neurology

## 2018-12-02 VITALS — BP 134/90 | HR 81 | Ht 72.0 in | Wt 164.0 lb

## 2018-12-02 DIAGNOSIS — R569 Unspecified convulsions: Secondary | ICD-10-CM | POA: Diagnosis not present

## 2018-12-02 DIAGNOSIS — G43911 Migraine, unspecified, intractable, with status migrainosus: Secondary | ICD-10-CM

## 2018-12-02 DIAGNOSIS — G40909 Epilepsy, unspecified, not intractable, without status epilepticus: Secondary | ICD-10-CM

## 2018-12-02 MED ORDER — DICLOFENAC POTASSIUM 50 MG PO TABS: 50.0000 mg | ORAL_TABLET | Freq: Three times a day (TID) | ORAL | 3 refills | Status: DC | PRN

## 2018-12-02 MED ORDER — ERENUMAB-AOOE 140 MG/ML ~~LOC~~ SOAJ: 140.0000 mg | SUBCUTANEOUS | 4 refills | Status: DC

## 2018-12-02 MED ORDER — LEVETIRACETAM 500 MG PO TABS: 500.0000 mg | ORAL_TABLET | Freq: Two times a day (BID) | ORAL | 1 refills | Status: DC

## 2018-12-02 NOTE — Patient Instructions (Addendum)
We will start Aimovig injections for the headache. Stop the [facility] powders, we will try diclofenac potassium for the headache if needed.

## 2018-12-02 NOTE — Progress Notes (Signed)
Reason for visit: Headache, seizures  Referring physician: [facility]  Spencer Mendoza is a 58 y.o. male  History of present illness:  Spencer Mendoza is a 58 year old right-handed white male with a history of bipolar disorder and a history of seizures or pseudoseizures.  The patient has been seen through this office in 2016 for chronic migraine and for seizures.  The patient in the past has been on a multitude of medications without benefit or tolerance.  He has been on propranolol, Elavil, Topamax, Depakote, Effexor, gabapentin, Seroquel, Prozac, and he currently is on Keppra for his seizures.  The patient continues to have daily headaches, he claims the last day without a headache was 4 or 5 years ago.  Ten days out of the month the headaches are very severe associated with photophobia, phonophobia, and nausea and vomiting and throbbing head pain.  Another 10 days of the month the headaches are moderate in severity, he is able to function and then the rest the time the headaches are still present but much more mild.  The patient is taking on average 4 BC powders a day, he does not drink any other caffeinated products during the day.  The patient last had a seizure 5 months ago, he claims he has generalized tonic-clonic seizures, he may stare off before the onset of the seizure.  He claims that he is operating a motor vehicle.  The patient takes Keppra 500 mg twice daily.  On 18 November 2018 he was admitted for suicidal ideation.  He currently has no primary care physician.  He is to have an appointment for the psychiatry physician, he has not yet made that appointment.  The patient comes to this office for an evaluation.  Past Medical History:  Diagnosis Date  . Allergy   . Anxiety   . Bipolar disorder ([place])   . Chronic insomnia 06/06/2015  . Depression   . Epilepsy ([place])   . Hemiplegic migraine with status migrainosus 06/06/2015  . Hypertension   . Migraine   . Stroke [facility])    patient denies  . Substance abuse [facility])     Past Surgical History:  Procedure Laterality Date  . APPENDECTOMY    . KNEE ARTHROSCOPY Right   . NOSE SURGERY      Family History  Problem Relation Age of Onset  . Heart disease Mother   . Stroke Mother   . Epilepsy Mother   . Heart disease Father   . Hypertension Father   . Melanoma Father   . Migraines Father   . Colon cancer Sister   . Colon cancer Cousin     Social history:  reports that he has been smoking cigarettes. He has a 15.00 pack-year smoking history. He has never used smokeless tobacco. He reports current alcohol use of about 6.0 standard drinks of alcohol per week. He reports current drug use. Frequency: 3.00 times per week. Drugs: Marijuana, Heroin, and Cocaine.  Medications:  Prior to Admission medications   Medication Sig Start Date End Date Taking? Authorizing Provider  albuterol (PROVENTIL HFA;VENTOLIN HFA) 108 (90 Base) MCG/ACT inhaler Inhale 2 puffs into the lungs every 4 (four) hours as needed for shortness of breath or wheezing. 05/01/18  Yes [provider]  FLUoxetine (PROZAC) 20 MG capsule Take 1 capsule (20 mg total) by mouth daily. Patient not taking: Reported on 09/17/2018 02/07/18   Rankin, Shuvon B, NP  levETIRAcetam (KEPPRA) 500 MG tablet Take 1 tablet (500 mg total) by  mouth 2 (two) times daily. Patient not taking: Reported on 12/02/2018 09/17/18   Ladell Pier, MD  propranolol (INDERAL) 20 MG tablet Take 1 tablet (20 mg total) by mouth 2 (two) times daily. Patient not taking: Reported on 09/17/2018 02/06/18   Rankin, Shuvon B, NP  QUEtiapine (SEROQUEL) 50 MG tablet Take 1 tablet (50 mg total) by mouth at bedtime. Patient not taking: Reported on 12/02/2018 11/22/18   Johnn Hai, MD      Allergies  Allergen Reactions  . Codeine Nausea And Vomiting  . Depakote [Divalproex Sodium] Other (See Comments)    Made patient shake  . Migranal [Dihydroergotamine] Nausea And Vomiting  . Topamax  [Topiramate] Other (See Comments)    Made patient angry    ROS:  Out of a complete 14 system review of symptoms, the patient complains only of the following symptoms, and all other reviewed systems are negative.  Weight loss Ringing in the ears Shortness of breath Headache Insomnia  Blood pressure 134/90, pulse 81, height 6' (1.829 m), weight 164 lb (74.4 kg).  Physical Exam  General: The patient is alert and cooperative at the time of the examination.  Eyes: Pupils are equal, round, and reactive to light. Discs are flat bilaterally.  Neck: The neck is supple, no carotid bruits are noted.  Respiratory: The respiratory examination is clear.  Cardiovascular: The cardiovascular examination reveals a regular rate and rhythm, no obvious murmurs or rubs are noted.  Skin: Extremities are without significant edema.  Neurologic Exam  Mental status: The patient is alert and oriented x 3 at the time of the examination. The patient has apparent normal recent and remote memory, with an apparently normal attention span and concentration ability.  Cranial nerves: Facial symmetry is present. There is good sensation of the face to pinprick and soft touch bilaterally. The strength of the facial muscles and the muscles to head turning and shoulder shrug are normal bilaterally. Speech is well enunciated, no aphasia or dysarthria is noted. Extraocular movements are full. Visual fields are full. The tongue is midline, and the patient has symmetric elevation of the soft palate. No obvious hearing deficits are noted.  Motor: The motor testing reveals 5 over 5 strength of all 4 extremities. Good symmetric motor tone is noted throughout.  Sensory: Sensory testing is intact to pinprick, soft touch, vibration sensation, and position sense on all 4 extremities. No evidence of extinction is noted.  Coordination: Cerebellar testing reveals good finger-nose-finger and heel-to-shin bilaterally.  Gait and  station: Gait is normal. Tandem gait is normal. Romberg is negative. No drift is seen.  Reflexes: Deep tendon reflexes are symmetric and normal bilaterally. Toes are downgoing bilaterally.   CT head 05/11/18:  IMPRESSION: Negative CT head  Improvement in sinus mucosal disease since 01/22/2018  * CT scan images were reviewed online. I agree with the written report.     Assessment/Plan:  1.  Chronic daily headache  2.  History of seizures versus pseudoseizures  3.  Bipolar disorder, suicidal ideation  The patient was given a prescription for the Keppra taking 500 mg twice daily.  He will be making contact with a psychiatry physician in the near future.  The patient has been on a multitude of medications for his headaches previously without benefit.  He will be placed on Aimovig, if this is not effective after 3 months, we will consider Botox therapy.  The patient will follow-up in 3 months.  I have indicated that he is not to  operate a motor vehicle for at least 6 months after his last seizure.  The patient will stop the [facility] powders as they may result in rebound headache, a prescription for diclofenac potassium was sent in to take if needed for his headache.  Jill Alexanders MD 12/02/2018 4:12 PM  [facility] [address] [place], [place] [address]  Phone [phone] Fax [phone]

## 2018-12-13 ENCOUNTER — Telehealth: Payer: Self-pay | Admitting: Neurology

## 2018-12-13 NOTE — Telephone Encounter (Signed)
[facility] contacted [facility] and spoke with tech [place]. She advised me Diclofenac was picked up by the pt on 12/02/18 45 tabs for a 15 day supply. Pt was unable to receive the full amount on 12/02/18 because pharmacy had to order remanding pills. Pt will be eligible to pick up the whole prescription of Diclofenac 50 mg 1 tab tid # 270 on 12/15/18. I attempted to reach the pt and advise of this information [phone]) but was advised pt's vm was full and could not accept new messages.. Will attempt at a later time.

## 2018-12-13 NOTE — Telephone Encounter (Addendum)
I contacted the pt and advised of information I had received from [facility] today. Pt states he spoke with [employer] regarding this issue as well and was advised the Diclofenac potassium 50 mg is not a covered medication within his formulary.. I advised the pt I would contact [facility] tomorrow and further discuss due to the conflicting instructions we are receiving.  Pt was agreeable and I advised I would touch base with him tomorrow.

## 2018-12-13 NOTE — Telephone Encounter (Signed)
Patient calling to let Dr Jannifer Franklin know that prescription for Diclofenic is being denied or restricted by [facility]. They are suggesting alternative. Please call to discuss.

## 2018-12-13 NOTE — Telephone Encounter (Signed)
Pt has returned call to RN, while waiting on response from RN pt ended call

## 2018-12-14 MED ORDER — KETOROLAC TROMETHAMINE 10 MG PO TABS: 10.0000 mg | ORAL_TABLET | Freq: Three times a day (TID) | ORAL | 2 refills | Status: DC | PRN

## 2018-12-14 NOTE — Addendum Note (Signed)
Addended by: Kathrynn Ducking on: 12/14/2018 03:55 PM   Modules accepted: Orders

## 2018-12-14 NOTE — Telephone Encounter (Signed)
I reviewed [employer] 2020 formulary and Diclofenac Potassium (Cataflam) is not a covered drug for the 2020 year. Diclofenac sodium is a covered alternative.  Pt received a 1 time supply of Cataflam on 12/03/18, but insurance will not cover another supply. Will fwd message to MD to see if Diclofenac sodium would be appropriate.

## 2018-12-14 NOTE — Telephone Encounter (Signed)
The diclofenac potassium is not covered with insurance, I will try to prescribe ketorolac 10 mg tablets instead.

## 2018-12-15 MED ORDER — KETOROLAC TROMETHAMINE 10 MG PO TABS: 10.0000 mg | ORAL_TABLET | Freq: Three times a day (TID) | ORAL | 2 refills | Status: DC | PRN

## 2018-12-15 NOTE — Telephone Encounter (Signed)
I contacted the pt and advised Dr. Jannifer Franklin has changed Diclofenac potassium to Ketorolac 10 mg. Pt was advised Dr. Jannifer Franklin [facility] [address]. Pt requested the rx be sent to [facility] on [address]. [facility] [address] canceled via fax (F #[id number]) and rx resubmitted to [facility] on [address]. Pt verbalized understanding/appreciation of med change.

## 2018-12-15 NOTE — Addendum Note (Signed)
Addended by: Verlin Grills T on: 12/15/2018 10:10 AM   Modules accepted: Orders

## 2018-12-30 ENCOUNTER — Telehealth: Payer: Self-pay | Admitting: Neurology

## 2018-12-30 NOTE — Telephone Encounter (Signed)
Pt called states refill for Erenumab-aooe (AIMOVIG) 140 MG/ML SOAJ is ready for pick up but it needs PA. Pt is agitated that it may take a couple of days for PA to go thru. I advised him RN would call and go over details to help ease his frustration. Please call to advise

## 2018-12-30 NOTE — Telephone Encounter (Signed)
I contacted the pt. I was able to explain I have submitted the pa request for Aimovig via cover my meds.   Key [id number].  I advised the pt we should have a determination within 24 hrs.  Pt verbalized understanding and voiced appreciation for the call. I stated once the determination had been made I would reach out and let him know. He verbalized understanding.

## 2018-12-30 NOTE — Telephone Encounter (Signed)
PA for aimovig has been approved.  PA effective dates 12/30/18-03/30/19. Pt/[facility] pharmacy notified.

## 2019-01-03 ENCOUNTER — Emergency Department (HOSPITAL_COMMUNITY)
Admission: EM | Admit: 2019-01-03 | Discharge: 2019-01-03 | Disposition: A | Discharge status: Home / Self Care | Payer: Medicare HMO | Attending: Emergency Medicine | Admitting: Emergency Medicine

## 2019-01-03 ENCOUNTER — Emergency Department (HOSPITAL_COMMUNITY): Payer: Medicare HMO

## 2019-01-03 ENCOUNTER — Encounter (HOSPITAL_COMMUNITY): Payer: Self-pay | Admitting: Emergency Medicine

## 2019-01-03 ENCOUNTER — Other Ambulatory Visit: Payer: Self-pay

## 2019-01-03 DIAGNOSIS — M545 Low back pain, unspecified: Secondary | ICD-10-CM

## 2019-01-03 DIAGNOSIS — R1031 Right lower quadrant pain: Secondary | ICD-10-CM | POA: Insufficient documentation

## 2019-01-03 DIAGNOSIS — R531 Weakness: Secondary | ICD-10-CM | POA: Diagnosis not present

## 2019-01-03 DIAGNOSIS — R9431 Abnormal electrocardiogram [ECG] [EKG]: Secondary | ICD-10-CM | POA: Diagnosis not present

## 2019-01-03 DIAGNOSIS — R300 Dysuria: Secondary | ICD-10-CM | POA: Diagnosis not present

## 2019-01-03 DIAGNOSIS — I10 Essential (primary) hypertension: Secondary | ICD-10-CM | POA: Insufficient documentation

## 2019-01-03 DIAGNOSIS — F17210 Nicotine dependence, cigarettes, uncomplicated: Secondary | ICD-10-CM | POA: Insufficient documentation

## 2019-01-03 DIAGNOSIS — R200 Anesthesia of skin: Secondary | ICD-10-CM | POA: Diagnosis not present

## 2019-01-03 DIAGNOSIS — K5730 Diverticulosis of large intestine without perforation or abscess without bleeding: Secondary | ICD-10-CM | POA: Diagnosis not present

## 2019-01-03 DIAGNOSIS — Z79899 Other long term (current) drug therapy: Secondary | ICD-10-CM | POA: Diagnosis not present

## 2019-01-03 DIAGNOSIS — M549 Dorsalgia, unspecified: Secondary | ICD-10-CM | POA: Diagnosis not present

## 2019-01-03 DIAGNOSIS — R39198 Other difficulties with micturition: Secondary | ICD-10-CM | POA: Diagnosis present

## 2019-01-03 DIAGNOSIS — M546 Pain in thoracic spine: Secondary | ICD-10-CM | POA: Diagnosis not present

## 2019-01-03 LAB — URINALYSIS, ROUTINE W REFLEX MICROSCOPIC
Bilirubin Urine: NEGATIVE
Glucose, UA: NEGATIVE mg/dL
Hgb urine dipstick: NEGATIVE
Ketones, ur: 20 mg/dL — AB
Leukocytes,Ua: NEGATIVE
Nitrite: NEGATIVE
Protein, ur: NEGATIVE mg/dL
Specific Gravity, Urine: 1.013 (ref 1.005–1.030)
pH: 5 (ref 5.0–8.0)

## 2019-01-03 LAB — COMPREHENSIVE METABOLIC PANEL
ALT: 31 U/L (ref 0–44)
AST: 32 U/L (ref 15–41)
Albumin: 4 g/dL (ref 3.5–5.0)
Alkaline Phosphatase: 57 U/L (ref 38–126)
Anion gap: 10 (ref 5–15)
BUN: 6 mg/dL (ref 6–20)
CO2: 25 mmol/L (ref 22–32)
Calcium: 9.2 mg/dL (ref 8.9–10.3)
Chloride: 102 mmol/L (ref 98–111)
Creatinine, Ser: 0.98 mg/dL (ref 0.61–1.24)
GFR calc Af Amer: >60 mL/min (ref >60)
GFR calc non Af Amer: >60 mL/min (ref >60)
Glucose, Bld: 85 mg/dL (ref 70–99)
Potassium: 3.3 mmol/L — ABNORMAL LOW (ref 3.5–5.1)
Sodium: 137 mmol/L (ref 135–145)
Total Bilirubin: 1.3 mg/dL — ABNORMAL HIGH (ref 0.3–1.2)
Total Protein: 6.2 g/dL — ABNORMAL LOW (ref 6.5–8.1)

## 2019-01-03 LAB — CBC WITH DIFFERENTIAL/PLATELET
Abs Immature Granulocytes: 0.03 10*3/uL (ref 0.00–0.07)
Basophils Absolute: 0.1 10*3/uL (ref 0.0–0.1)
Basophils Relative: 1 %
Eosinophils Absolute: 0.4 10*3/uL (ref 0.0–0.5)
Eosinophils Relative: 8 %
HCT: 41.8 % (ref 39.0–52.0)
Hemoglobin: 14.2 g/dL (ref 13.0–17.0)
Immature Granulocytes: 1 %
Lymphocytes Relative: 36 %
Lymphs Abs: 1.9 10*3/uL (ref 0.7–4.0)
MCH: 31.9 pg (ref 26.0–34.0)
MCHC: 34 g/dL (ref 30.0–36.0)
MCV: 93.9 fL (ref 80.0–100.0)
Monocytes Absolute: 0.5 10*3/uL (ref 0.1–1.0)
Monocytes Relative: 10 %
Neutro Abs: 2.4 10*3/uL (ref 1.7–7.7)
Neutrophils Relative %: 44 %
Platelets: 234 10*3/uL (ref 150–400)
RBC: 4.45 MIL/uL (ref 4.22–5.81)
RDW: 13.2 % (ref 11.5–15.5)
WBC: 5.4 10*3/uL (ref 4.0–10.5)
nRBC: 0 % (ref 0.0–0.2)

## 2019-01-03 MED ORDER — ONDANSETRON HCL 4 MG/2ML IJ SOLN
4.0000 mg | Freq: Once | INTRAMUSCULAR | Status: AC
  Administered 2019-01-03: 4 mg via INTRAVENOUS
  Filled 2019-01-03: qty 2

## 2019-01-03 MED ORDER — LORAZEPAM 2 MG/ML IJ SOLN
2.0000 mg | Freq: Once | INTRAMUSCULAR | Status: DC | PRN
  Filled 2019-01-03 (×2): qty 1

## 2019-01-03 MED ORDER — SODIUM CHLORIDE 0.9 % IV BOLUS
1000.0000 mL | Freq: Once | INTRAVENOUS | Status: AC
  Administered 2019-01-03: 1000 mL via INTRAVENOUS

## 2019-01-03 MED ORDER — POTASSIUM CHLORIDE CRYS ER 20 MEQ PO TBCR: 40.0000 meq | EXTENDED_RELEASE_TABLET | Freq: Once | ORAL | Status: DC

## 2019-01-03 MED ORDER — LORAZEPAM 2 MG/ML IJ SOLN
2.0000 mg | Freq: Once | INTRAMUSCULAR | Status: AC
  Administered 2019-01-03: 2 mg via INTRAVENOUS

## 2019-01-03 NOTE — ED Provider Notes (Addendum)
[facility] EMERGENCY DEPARTMENT Provider Note   CSN: [id number] Arrival date & time: 01/03/19  0719     History   Chief Complaint Chief Complaint  Patient presents with  . Back Pain  . urinary symptoms    HPI Spencer Mendoza is a 58 y.o. male.  HPI  58 year old male presents with back pain and difficulty urinating.  He states these started around the same time a couple weeks ago.  He is unable to urinate much when he goes he states he is feeling like he goes to the bathroom very often.  It is dark and foul-smelling.  It also hurts his penis when he is urinating.  However no penile pain otherwise or scrotal pain/swelling.  He is also having pain in his left side and in his mid low back.  No trauma.  He feels diffusely weak but not specifically weak in his lower extremities.  He has felt some tingling in his thighs. Vomited last night. Has been having diarrhea since this started.  Past Medical History:  Diagnosis Date  . Allergy   . Anxiety   . Bipolar disorder ([place])   . Chronic insomnia 06/06/2015  . Depression   . Epilepsy ([place])   . Hemiplegic migraine with status migrainosus 06/06/2015  . Hypertension   . Migraine   . Stroke [facility])    patient denies  . Substance abuse [facility])     Patient Active Problem List   Diagnosis Date Noted  . MDD (major depressive disorder), recurrent severe, without psychosis ([place]) 11/18/2018  . Major depressive disorder, recurrent episode, severe ([place]) 01/30/2018  . Substance induced mood disorder ([place]) 01/30/2018  . MDD (major depressive disorder), single episode, severe , no psychosis ([place]) 01/29/2018  . Hemiplegic migraine with status migrainosus 06/06/2015  . Chronic insomnia 06/06/2015  . Cocaine abuse ([place]) 05/22/2015  . Alcohol dependence with uncomplicated withdrawal ([place])   . Headache, migraine   . Migraines 11/29/2014  . Headache 08/08/2014  . Seizures ([place]) 01/09/2014  . Chronic low back pain 12/28/2013  .  Hemiplegia, unspecified, affecting dominant side 12/06/2013  . CVA (cerebral infarction) 12/04/2013  . Leukocytosis 12/04/2013  . Acute Gastroenteritis 12/04/2013  . Hypertension 12/04/2013  . Hemiparesis ([place]) 12/04/2013  . Delirium tremens ([place]) 07/29/2013  . Alcohol withdrawal ([place]) 07/29/2013  . UGIB (upper gastrointestinal bleed) 07/29/2013  . Chest pain 07/28/2013    Past Surgical History:  Procedure Laterality Date  . APPENDECTOMY    . KNEE ARTHROSCOPY Right   . NOSE SURGERY          Home Medications    Prior to Admission medications   Medication Sig Start Date End Date Taking? Authorizing Provider  albuterol (PROVENTIL HFA;VENTOLIN HFA) 108 (90 Base) MCG/ACT inhaler Inhale 2 puffs into the lungs every 4 (four) hours as needed for shortness of breath or wheezing. 05/01/18   [provider]  Erenumab-aooe (AIMOVIG) 140 MG/ML SOAJ Inject 140 mg into the skin every 30 (thirty) days. 12/02/18   Kathrynn Ducking, MD  FLUoxetine (PROZAC) 20 MG capsule Take 1 capsule (20 mg total) by mouth daily. Patient not taking: Reported on 09/17/2018 02/07/18   Rankin, Shuvon B, NP  ketorolac (TORADOL) 10 MG tablet Take 1 tablet (10 mg total) by mouth every 8 (eight) hours as needed. 12/15/18   Kathrynn Ducking, MD  levETIRAcetam (KEPPRA) 500 MG tablet Take 1 tablet (500 mg total) by mouth 2 (two) times daily. 12/02/18   Kathrynn Ducking,  MD  propranolol (INDERAL) 20 MG tablet Take 1 tablet (20 mg total) by mouth 2 (two) times daily. Patient not taking: Reported on 09/17/2018 02/06/18   Rankin, Shuvon B, NP  QUEtiapine (SEROQUEL) 50 MG tablet Take 1 tablet (50 mg total) by mouth at bedtime. Patient not taking: Reported on 12/02/2018 11/22/18   Johnn Hai, MD    Family History Family History  Problem Relation Age of Onset  . Heart disease Mother   . Stroke Mother   . Epilepsy Mother   . Heart disease Father   . Hypertension Father   . Melanoma Father   . Migraines Father   .  Colon cancer Sister   . Colon cancer Cousin     Social History Social History   Tobacco Use  . Smoking status: Current Every Day Smoker    Packs/day: 1.00    Years: 15.00    Pack years: 15.00    Types: Cigarettes  . Smokeless tobacco: Never Used  Substance Use Topics  . Alcohol use: Yes    Alcohol/week: 6.0 standard drinks    Types: 6 Cans of beer per week    Comment: pt reports no ETOH and no durgs in 3 moths.   . Drug use: Yes    Frequency: 3.0 times per week    Types: Marijuana, Heroin, Cocaine    Comment: crack, heroin pt reports no ETOH and no durgs in 3 moths.      Allergies   Codeine; Depakote [divalproex sodium]; Migranal [dihydroergotamine]; and Topamax [topiramate]   Review of Systems Review of Systems  Constitutional: Negative for fever.  Gastrointestinal: Positive for diarrhea and vomiting. Negative for abdominal pain.  Genitourinary: Positive for difficulty urinating, dysuria, flank pain and frequency.  Musculoskeletal: Positive for back pain.  Neurological: Positive for weakness and numbness.  All other systems reviewed and are negative.    Physical Exam Updated Vital Signs BP 122/89 (BP Location: Right Arm)   Pulse 80   Temp 98.7 F (37.1 C) (Oral)   Resp 20   Wt 79.4 kg   SpO2 100%   BMI 23.73 kg/m   Physical Exam Vitals signs and nursing note reviewed.  Constitutional:      General: He is not in acute distress.    Appearance: He is well-developed. He is not ill-appearing or diaphoretic.  HENT:     Head: Normocephalic and atraumatic.     Right Ear: External ear normal.     Left Ear: External ear normal.     Nose: Nose normal.  Eyes:     General:        Right eye: No discharge.        Left eye: No discharge.  Neck:     Musculoskeletal: Neck supple.  Cardiovascular:     Rate and Rhythm: Normal rate and regular rhythm.     Heart sounds: Normal heart sounds.  Pulmonary:     Effort: Pulmonary effort is normal.     Breath sounds:  Normal breath sounds.  Abdominal:     Palpations: Abdomen is soft.     Tenderness: There is abdominal tenderness in the right lower quadrant and suprapubic area. There is left CVA tenderness. There is no right CVA tenderness.  Genitourinary:    Penis: Circumcised. No erythema, tenderness or swelling.      Scrotum/Testes:        Right: Tenderness or swelling not present.        Left: Tenderness or swelling not present.  Musculoskeletal:     Thoracic back: He exhibits tenderness (lower, midline).     Lumbar back: He exhibits tenderness.       Back:  Skin:    General: Skin is warm and dry.  Neurological:     Mental Status: He is alert.     Comments: Equal strength in BLE, technically 5/5 but seem a little weak bilaterally  Psychiatric:        Mood and Affect: Mood is not anxious.      ED Treatments / Results  Labs (all labs ordered are listed, but only abnormal results are displayed) Labs Reviewed  COMPREHENSIVE METABOLIC PANEL - Abnormal; Notable for the following components:      Result Value   Potassium 3.3 (*)    Total Protein 6.2 (*)    Total Bilirubin 1.3 (*)    All other components within normal limits  URINALYSIS, ROUTINE W REFLEX MICROSCOPIC - Abnormal; Notable for the following components:   Ketones, ur 20 (*)    All other components within normal limits  CBC WITH DIFFERENTIAL/PLATELET    EKG EKG Interpretation  Date/Time:  Monday January 03 2019 07:45:26 EST Ventricular Rate:  68 PR Interval:    QRS Duration: 111 QT Interval:  404 QTC Calculation: 430 R Axis:   113 Text Interpretation:  Sinus rhythm Right axis deviation Probable anteroseptal infarct, old Baseline wander in lead(s) V1 similar to Jun 2019 Confirmed by Sherwood Gambler [phone]) on 01/03/2019 7:49:27 AM Also confirmed by Sherwood Gambler [phone]), editor Hattie Perch ([id number])  on 01/03/2019 8:07:08 AM   Radiology Mr Thoracic Spine Wo Contrast  Result Date: 01/03/2019 CLINICAL DATA:  Back  pain.  Urinary symptoms.  Tingling in legs. EXAM: MRI THORACIC AND LUMBAR SPINE WITHOUT CONTRAST TECHNIQUE: Multiplanar and multiecho pulse sequences of the thoracic and lumbar spine were obtained without intravenous contrast. COMPARISON:  None. FINDINGS: The study is motion degraded. The patient could not complete the examination despite sedation. Small or subtle lesions could be overlooked. MRI THORACIC SPINE FINDINGS Alignment:  Physiologic. Vertebrae: No fracture, evidence of discitis, or bone lesion. Cord:  Normal signal and morphology. Paraspinal and other soft tissues: Negative. Disc levels: No compressive disc protrusion or spinal stenosis. Minor thoracic protrusion at T6-7, doubtful significance. MRI LUMBAR SPINE FINDINGS Segmentation:  Standard. Alignment:  Physiologic. Vertebrae: No fracture, evidence of discitis, or bone lesion. Minor endplate changes above and below L4-5 reflecting disc disease. Conus medullaris and cauda equina: Conus extends to the L1 level. Conus and cauda equina appear normal. Paraspinal and other soft tissues: No masses. There may be mild bladder distention, incompletely evaluated. Disc levels: Axial images through the lumbar spaces are severely motion degraded, and only 1 sequence could be obtained. No visible disc protrusion or spinal stenosis. IMPRESSION: Motion degraded exam.  See comments above. MR THORACIC SPINE IMPRESSION No compressive thoracic disc protrusion, spinal stenosis, or intraspinal mass lesion. No intrinsic cord abnormality. MR LUMBAR SPINE IMPRESSION No lumbar spinal stenosis or intraspinal mass lesion. Distended bladder, incompletely evaluated uncertain significance. Electronically Signed   By: Staci Righter M.D.   On: 01/03/2019 12:30   Mr Lumbar Spine Wo Contrast  Result Date: 01/03/2019 CLINICAL DATA:  Back pain.  Urinary symptoms.  Tingling in legs. EXAM: MRI THORACIC AND LUMBAR SPINE WITHOUT CONTRAST TECHNIQUE: Multiplanar and multiecho pulse sequences  of the thoracic and lumbar spine were obtained without intravenous contrast. COMPARISON:  None. FINDINGS: The study is motion degraded. The patient could not complete the examination despite  sedation. Small or subtle lesions could be overlooked. MRI THORACIC SPINE FINDINGS Alignment:  Physiologic. Vertebrae: No fracture, evidence of discitis, or bone lesion. Cord:  Normal signal and morphology. Paraspinal and other soft tissues: Negative. Disc levels: No compressive disc protrusion or spinal stenosis. Minor thoracic protrusion at T6-7, doubtful significance. MRI LUMBAR SPINE FINDINGS Segmentation:  Standard. Alignment:  Physiologic. Vertebrae: No fracture, evidence of discitis, or bone lesion. Minor endplate changes above and below L4-5 reflecting disc disease. Conus medullaris and cauda equina: Conus extends to the L1 level. Conus and cauda equina appear normal. Paraspinal and other soft tissues: No masses. There may be mild bladder distention, incompletely evaluated. Disc levels: Axial images through the lumbar spaces are severely motion degraded, and only 1 sequence could be obtained. No visible disc protrusion or spinal stenosis. IMPRESSION: Motion degraded exam.  See comments above. MR THORACIC SPINE IMPRESSION No compressive thoracic disc protrusion, spinal stenosis, or intraspinal mass lesion. No intrinsic cord abnormality. MR LUMBAR SPINE IMPRESSION No lumbar spinal stenosis or intraspinal mass lesion. Distended bladder, incompletely evaluated uncertain significance. Electronically Signed   By: Staci Righter M.D.   On: 01/03/2019 12:30   Ct Renal Stone Study  Result Date: 01/03/2019 CLINICAL DATA:  58 year old male with a history urinary symptoms and back pain EXAM: CT ABDOMEN AND PELVIS WITHOUT CONTRAST TECHNIQUE: Multidetector CT imaging of the abdomen and pelvis was performed following the standard protocol without IV contrast. COMPARISON:  None. FINDINGS: Lower chest: Mild tree-in-bud opacity at the  dependent aspects of the lung bases on the left and the right. Hepatobiliary: Diffusely decreased attenuation of liver parenchyma. Unremarkable gallbladder. Pancreas: Unremarkable pancreas Spleen: Unremarkable spleen Adrenals/Urinary Tract: Unremarkable adrenal glands. Bilateral kidneys without hydronephrosis or nephrolithiasis. Unremarkable course the bilateral ureters. Partial distention of urinary bladder. Stomach/Bowel: Unremarkable stomach. Unremarkable small bowel. No abnormal distention. No transition point. No focal wall thickening or inflammatory changes. Normal appendix. Colonic diverticular disease. No focal inflammatory changes or wall thickening. Vascular/Lymphatic: Atherosclerotic changes of the abdominal aorta and the bilateral iliac arteries. No adenopathy. Reproductive: Unremarkable appearance of the prostate. Other: No abdominal wall hernia. Musculoskeletal: No acute displaced fracture. Chronic non healed fracture of right posterior ninth rib mild degenerative changes of the spine. IMPRESSION: No acute CT finding of the abdomen/pelvis. There is a mild tree-in-bud pattern of opacity at the bilateral lung bases of uncertain significance. Recommend correlation with patient's pulmonary status/symptoms. If there is concern for pneumonia, chest x-ray may be useful. Liver steatosis. Aortic atherosclerosis.  Aortic Atherosclerosis (ICD10-I70.0). Diverticular disease without evidence of acute diverticulitis. Electronically Signed   By: Corrie Mckusick D.O.   On: 01/03/2019 13:33    Procedures Procedures (including critical care time)  Medications Ordered in ED Medications  LORazepam (ATIVAN) injection 2 mg (has no administration in time range)  potassium chloride SA (K-DUR,KLOR-CON) CR tablet 40 mEq (40 mEq Oral Not Given 01/03/19 0935)  sodium chloride 0.9 % bolus 1,000 mL (0 mLs Intravenous Stopped 01/03/19 1233)  ondansetron (ZOFRAN) injection 4 mg (4 mg Intravenous Given 01/03/19 0821)  LORazepam  (ATIVAN) injection 2 mg (2 mg Intravenous Given 01/03/19 0935)     Initial Impression / Assessment and Plan / ED Course  I have reviewed the triage vital signs and the nursing notes.  Pertinent labs & imaging results that were available during my care of the patient were reviewed by me and considered in my medical decision making (see chart for details).     Patient had an MRI obtained given the  vague sensory changes, urine symptoms and back pain.  This does not show any acute pathology.  CT abdomen pelvis without obvious pathology such as stone or other acute abnormality.  Urine does not show UTI.  Prostate exam was deferred currently but there is no enlargement of the prostate on CT.  Unclear why is having the dysuria and I have encouraged him to follow-up with urology.  Otherwise work-up is unremarkable.  He also does not appear to have retention. Had trouble urinating, but after he voided after imaging, bladder scan and foley was placed but with minimal amount. He appears stable for discharge home with return precautions.  No pulmonary symptoms.   Final Clinical Impressions(s) / ED Diagnoses   Final diagnoses:  Dysuria  Acute midline low back pain without sciatica    ED Discharge Orders    None       Sherwood Gambler, MD 01/03/19 1555    Sherwood Gambler, MD 01/03/19 [phone]

## 2019-01-03 NOTE — ED Triage Notes (Signed)
Pt in from home with c/o urinary symptoms (retention, burning while urinating) x few weeks. States he has bilateral low back pain as well, also has tingling in his legs.

## 2019-01-03 NOTE — Discharge Instructions (Addendum)
If you develop worsening, recurrent, or continued back pain, numbness or weakness in the legs, incontinence of your bowels or bladders, numbness of your buttocks, fever, abdominal pain, or any other new/concerning symptoms then return to the ER for evaluation.  

## 2019-03-02 ENCOUNTER — Telehealth: Payer: Self-pay | Admitting: Neurology

## 2019-03-02 NOTE — Telephone Encounter (Signed)
Called Patient several times and left message with his sister as well about setting up virtual visit . On last message I relayed that he can call us back when ready to schedule we will be glad to schedule him. Thanks Hinton Dyer.

## 2019-03-03 ENCOUNTER — Ambulatory Visit: Payer: Medicare HMO | Admitting: Neurology

## 2019-03-31 ENCOUNTER — Telehealth: Payer: Self-pay

## 2019-03-31 NOTE — Telephone Encounter (Signed)
Reauthorization for [employer] has been submitted to cover my meds.  PA received instant approval.  PA Case: [id number],  Status: Approved  Coverage Starts on: 11/17/2018 Coverage Ends on: 11/17/2019   Questions? Contact [phone].

## 2019-04-17 DIAGNOSIS — E872 Acidosis, unspecified: Secondary | ICD-10-CM | POA: Insufficient documentation

## 2019-04-17 DIAGNOSIS — R945 Abnormal results of liver function studies: Secondary | ICD-10-CM | POA: Insufficient documentation

## 2019-04-17 DIAGNOSIS — I158 Other secondary hypertension: Secondary | ICD-10-CM | POA: Diagnosis not present

## 2019-04-17 DIAGNOSIS — R21 Rash and other nonspecific skin eruption: Secondary | ICD-10-CM | POA: Diagnosis not present

## 2019-04-17 DIAGNOSIS — K7010 Alcoholic hepatitis without ascites: Secondary | ICD-10-CM | POA: Diagnosis not present

## 2019-04-17 DIAGNOSIS — E860 Dehydration: Secondary | ICD-10-CM | POA: Diagnosis not present

## 2019-04-17 DIAGNOSIS — F17210 Nicotine dependence, cigarettes, uncomplicated: Secondary | ICD-10-CM | POA: Diagnosis not present

## 2019-04-17 DIAGNOSIS — I10 Essential (primary) hypertension: Secondary | ICD-10-CM | POA: Diagnosis not present

## 2019-04-17 DIAGNOSIS — F10231 Alcohol dependence with withdrawal delirium: Secondary | ICD-10-CM | POA: Diagnosis not present

## 2019-04-17 DIAGNOSIS — R569 Unspecified convulsions: Secondary | ICD-10-CM | POA: Diagnosis not present

## 2019-04-17 DIAGNOSIS — G40909 Epilepsy, unspecified, not intractable, without status epilepticus: Secondary | ICD-10-CM | POA: Diagnosis not present

## 2019-04-17 DIAGNOSIS — Z720 Tobacco use: Secondary | ICD-10-CM | POA: Diagnosis not present

## 2019-04-17 DIAGNOSIS — R7989 Other specified abnormal findings of blood chemistry: Secondary | ICD-10-CM | POA: Insufficient documentation

## 2019-04-17 DIAGNOSIS — F1029 Alcohol dependence with unspecified alcohol-induced disorder: Secondary | ICD-10-CM | POA: Diagnosis not present

## 2019-04-17 DIAGNOSIS — Z8673 Personal history of transient ischemic attack (TIA), and cerebral infarction without residual deficits: Secondary | ICD-10-CM | POA: Diagnosis not present

## 2019-04-17 DIAGNOSIS — F10239 Alcohol dependence with withdrawal, unspecified: Secondary | ICD-10-CM | POA: Diagnosis not present

## 2019-04-17 DIAGNOSIS — I159 Secondary hypertension, unspecified: Secondary | ICD-10-CM | POA: Diagnosis not present

## 2019-05-17 ENCOUNTER — Other Ambulatory Visit: Payer: Self-pay | Admitting: Neurology

## 2019-05-17 DIAGNOSIS — G40909 Epilepsy, unspecified, not intractable, without status epilepticus: Secondary | ICD-10-CM
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
## 2019-05-19 ENCOUNTER — Ambulatory Visit (INDEPENDENT_AMBULATORY_CARE_PROVIDER_SITE_OTHER): Payer: Medicare HMO | Admitting: Neurology

## 2019-05-19 ENCOUNTER — Telehealth: Payer: Self-pay

## 2019-05-19 ENCOUNTER — Other Ambulatory Visit: Payer: Self-pay

## 2019-05-19 ENCOUNTER — Encounter: Payer: Self-pay | Admitting: Neurology

## 2019-05-19 VITALS — BP 108/64 | HR 62 | Temp 97.7°F | Ht 72.0 in | Wt 167.6 lb

## 2019-05-19 DIAGNOSIS — G43911 Migraine, unspecified, intractable, with status migrainosus: Secondary | ICD-10-CM

## 2019-05-19 DIAGNOSIS — R569 Unspecified convulsions: Secondary | ICD-10-CM

## 2019-05-19 DIAGNOSIS — G40909 Epilepsy, unspecified, not intractable, without status epilepticus: Secondary | ICD-10-CM

## 2019-05-19 DIAGNOSIS — G43909 Migraine, unspecified, not intractable, without status migrainosus: Secondary | ICD-10-CM | POA: Diagnosis not present

## 2019-05-19 MED ORDER — LEVETIRACETAM 500 MG PO TABS: ORAL_TABLET | ORAL | 1 refills | Status: DC

## 2019-05-19 MED ORDER — NALTREXONE HCL 50 MG PO TABS: 50.0000 mg | ORAL_TABLET | Freq: Every day | ORAL | 3 refills | Status: DC

## 2019-05-19 MED ORDER — THIAMINE HCL 100 MG PO TABS: 100.0000 mg | ORAL_TABLET | Freq: Every day | ORAL | 3 refills | Status: DC

## 2019-05-19 MED ORDER — FOLIC ACID 1 MG PO TABS: 1.0000 mg | ORAL_TABLET | Freq: Every day | ORAL | 3 refills | Status: DC

## 2019-05-19 MED ORDER — AJOVY 225 MG/1.5ML ~~LOC~~ SOAJ: 225.0000 mg | SUBCUTANEOUS | 3 refills | Status: DC

## 2019-05-19 NOTE — Patient Instructions (Signed)
No driving for 6 months since seizure in June 2020. Please continue taking Keppra.   Fremanezumab injection What is this medicine? FREMANEZUMAB (fre ma NEZ ue mab) is used to prevent migraine headaches. This medicine may be used for other purposes; ask your health care provider or pharmacist if you have questions. COMMON BRAND NAME(S): AJOVY What should I tell my health care provider before I take this medicine? They need to know if you have any of these conditions:  an unusual or allergic reaction to fremanezumab, other medicines, foods, dyes, or preservatives  pregnant or trying to get pregnant  breast-feeding How should I use this medicine? This medicine is for injection under the skin. You will be taught how to prepare and give this medicine. Use exactly as directed. Take your medicine at regular intervals. Do not take your medicine more often than directed. It is important that you put your used needles and syringes in a special sharps container. Do not put them in a trash can. If you do not have a sharps container, call your pharmacist or healthcare provider to get one. Talk to your pediatrician regarding the use of this medicine in children. Special care may be needed. Overdosage: If you think you have taken too much of this medicine contact a poison control center or emergency room at once. NOTE: This medicine is only for you. Do not share this medicine with others. What if I miss a dose? If you miss a dose, take it as soon as you can. If it is almost time for your next dose, take only that dose. Do not take double or extra doses. What may interact with this medicine? Interactions are not expected. This list may not describe all possible interactions. Give your health care provider a list of all the medicines, herbs, non-prescription drugs, or dietary supplements you use. Also tell them if you smoke, drink alcohol, or use illegal drugs. Some items may interact with your  medicine. What should I watch for while using this medicine? Tell your doctor or healthcare professional if your symptoms do not start to get better or if they get worse. What side effects may I notice from receiving this medicine? Side effects that you should report to your doctor or health care professional as soon as possible:  allergic reactions like skin rash, itching or hives, swelling of the face, lips, or tongue Side effects that usually do not require medical attention (report these to your doctor or health care professional if they continue or are bothersome):  pain, redness, or irritation at site where injected This list may not describe all possible side effects. Call your doctor for medical advice about side effects. You may report side effects to FDA at 1-800-FDA-1088. Where should I keep my medicine? Keep out of the reach of children. You will be instructed on how to store this medicine. Throw away any unused medicine after the expiration date on the label. NOTE: This sheet is a summary. It may not cover all possible information. If you have questions about this medicine, talk to your doctor, pharmacist, or health care provider.  2020 Elsevier/Gold Standard (2017-08-03 17:22:56)

## 2019-05-19 NOTE — Progress Notes (Signed)
I have read the note, and I agree with the clinical assessment and plan.  Magdelyn Roebuck K Elster Corbello   

## 2019-05-19 NOTE — Telephone Encounter (Signed)
Pending approval for Ajovy 225mg /1.5 mL auto injector Key: [id number] ICD 10 code: G41.712 and H87.183  I will update once a decision has been made.

## 2019-05-23 ENCOUNTER — Telehealth: Payer: Self-pay

## 2019-05-23 NOTE — Telephone Encounter (Signed)
Patient is calling stating that [facility] is telling him that he cannot get his Keppra filled until September but he only has about 4 days left. Please advise.

## 2019-05-23 NOTE — Telephone Encounter (Signed)
I called [facility] at [place] location, spoke to [place].  Keppra ready at [place], [place].  He will contact them to return stock and then he will fill at the [place] location.  Pt notified.  He was appreciative.

## 2019-05-24 ENCOUNTER — Telehealth: Payer: Self-pay | Admitting: Neurology

## 2019-05-24 MED ORDER — EMGALITY 120 MG/ML ~~LOC~~ SOAJ: 240.0000 mg | SUBCUTANEOUS | 0 refills | Status: DC

## 2019-05-24 NOTE — Telephone Encounter (Signed)
Spencer Mendoza is not preferred with his Insurance. We will try Emgality monthly injection for migraine prevention. I have sent in the starting dose of 240 mg, after the 1st dose, I will send a new rx for 120 mg

## 2019-05-24 NOTE — Telephone Encounter (Signed)
Noted  

## 2019-05-25 ENCOUNTER — Telehealth: Payer: Self-pay

## 2019-05-25 NOTE — Telephone Encounter (Signed)
Pending approval for Emgality 120 mg/mL Key: [id number] ICD 10 code: I10.301   Received a instant approval for Emgality 120 mg/mL. Approved through 08-23-2019. I will fax a copy of the approval letter to the pharmacy once I have received one.

## 2019-06-13 ENCOUNTER — Encounter: Payer: Self-pay | Admitting: Gastroenterology

## 2019-08-04 DIAGNOSIS — F1010 Alcohol abuse, uncomplicated: Secondary | ICD-10-CM | POA: Diagnosis not present

## 2019-08-04 DIAGNOSIS — R569 Unspecified convulsions: Secondary | ICD-10-CM | POA: Diagnosis not present

## 2019-08-04 DIAGNOSIS — F1290 Cannabis use, unspecified, uncomplicated: Secondary | ICD-10-CM | POA: Diagnosis not present

## 2019-08-04 DIAGNOSIS — I498 Other specified cardiac arrhythmias: Secondary | ICD-10-CM | POA: Diagnosis not present

## 2019-08-04 DIAGNOSIS — G40909 Epilepsy, unspecified, not intractable, without status epilepticus: Secondary | ICD-10-CM | POA: Diagnosis not present

## 2019-08-04 DIAGNOSIS — R51 Headache: Secondary | ICD-10-CM | POA: Diagnosis not present

## 2019-08-04 DIAGNOSIS — R4182 Altered mental status, unspecified: Secondary | ICD-10-CM | POA: Diagnosis not present

## 2019-08-04 DIAGNOSIS — F1029 Alcohol dependence with unspecified alcohol-induced disorder: Secondary | ICD-10-CM | POA: Diagnosis not present

## 2019-08-04 DIAGNOSIS — E872 Acidosis: Secondary | ICD-10-CM | POA: Diagnosis not present

## 2019-08-04 DIAGNOSIS — Z5320 Procedure and treatment not carried out because of patient's decision for unspecified reasons: Secondary | ICD-10-CM | POA: Diagnosis not present

## 2019-08-04 DIAGNOSIS — F10232 Alcohol dependence with withdrawal with perceptual disturbance: Secondary | ICD-10-CM | POA: Diagnosis not present

## 2019-08-04 DIAGNOSIS — E889 Metabolic disorder, unspecified: Secondary | ICD-10-CM | POA: Diagnosis not present

## 2019-08-05 DIAGNOSIS — F1029 Alcohol dependence with unspecified alcohol-induced disorder: Secondary | ICD-10-CM | POA: Diagnosis not present

## 2019-08-08 ENCOUNTER — Encounter: Payer: Self-pay | Admitting: Gastroenterology

## 2019-08-08 ENCOUNTER — Telehealth: Payer: Self-pay | Admitting: Neurology

## 2019-08-08 ENCOUNTER — Telehealth: Payer: Self-pay

## 2019-08-08 MED ORDER — LAMOTRIGINE 25 MG PO TABS: ORAL_TABLET | ORAL | 0 refills | Status: DC

## 2019-08-08 NOTE — Telephone Encounter (Signed)
I called pt and he has had 4 seizures since last seen 05-24-19.  Trigger stress.  He states he lives alone,  Last seizure on Thursday , fell inbetween coffee table and couch, hit head (has knot).  He has not seen psychiatry.  He states since last sz he has not been able to sleep,, feels anxiety, panic attacks.  I would forward and let SS/NP know.  I will ask Hinton Dyer, C in referrals about the referral and get her to call pt with #.

## 2019-08-08 NOTE — Telephone Encounter (Signed)
Pt called stating that he had a couple of seizures last week and ever since then he is not able to sleep, is having really bad anxiety and depression. He would like to know if something can be called in for him so that he can sleep. Please advise.

## 2019-08-08 NOTE — Telephone Encounter (Signed)
I called the patient. Since 05/24/2019, he has had 4 seizures due to being under a lot of stress. He has not been able to sleep. He has been feeling anxious and nervous.  He is trying to get into see a psychiatrist.  He remains on [place]. Is not drinking alcohol. I will switch to lamotrigine 25 mg tablets titrating to 100 mg BID.  Lamotrigine Titration Instructions with 25 mg tablets to achieve 100 mg twice daily  Start taking 1 tablet (25 mg) twice daily for 2 weeks Then take 2 tablets (50 mg) twice daily for 2 weeks  Then take 3 tablets (75 mg) twice daily for 2 weeks  Call our office upon completion, and I will send in a new prescription.  He will continue taking Keppra until his titration of Lamictal 100 mg BID is complete. We will likely titrate him off Keppra at that time. The switch to Lamictal hopefully will help his headaches and mood.   If his trouble sleeping continues he may contact our office, we could consider trazodone if needed.

## 2019-08-08 NOTE — Telephone Encounter (Signed)
Lamictal refill has been faxed to the patient's pharmacy listed below. Confirmation has been received.        [phone] - [place], [place] - [address] [phone] (Phone) [phone] (Fax)

## 2019-08-10 ENCOUNTER — Other Ambulatory Visit: Payer: Self-pay

## 2019-08-10 ENCOUNTER — Ambulatory Visit (HOSPITAL_COMMUNITY)
Admission: EM | Admit: 2019-08-10 | Discharge: 2019-08-10 | Disposition: A | Discharge status: Home / Self Care | Payer: Medicare HMO | Source: Home / Self Care

## 2019-08-10 ENCOUNTER — Encounter (HOSPITAL_COMMUNITY): Payer: Self-pay

## 2019-08-10 ENCOUNTER — Telehealth: Payer: Self-pay | Admitting: Neurology

## 2019-08-10 ENCOUNTER — Emergency Department (HOSPITAL_COMMUNITY)
Admission: EM | Admit: 2019-08-10 | Discharge: 2019-08-10 | Discharge status: Against Medical Advice | Payer: Medicare HMO | Attending: Emergency Medicine | Admitting: Emergency Medicine

## 2019-08-10 DIAGNOSIS — Z5321 Procedure and treatment not carried out due to patient leaving prior to being seen by health care provider: Secondary | ICD-10-CM | POA: Diagnosis not present

## 2019-08-10 DIAGNOSIS — R109 Unspecified abdominal pain: Secondary | ICD-10-CM | POA: Diagnosis present

## 2019-08-10 LAB — CBC
HCT: 45.3 % (ref 39.0–52.0)
Hemoglobin: 16.3 g/dL (ref 13.0–17.0)
MCH: 32.7 pg (ref 26.0–34.0)
MCHC: 36 g/dL (ref 30.0–36.0)
MCV: 91 fL (ref 80.0–100.0)
Platelets: 234 10*3/uL (ref 150–400)
RBC: 4.98 MIL/uL (ref 4.22–5.81)
RDW: 13.8 % (ref 11.5–15.5)
WBC: 8.8 10*3/uL (ref 4.0–10.5)
nRBC: 0 % (ref 0.0–0.2)

## 2019-08-10 LAB — COMPREHENSIVE METABOLIC PANEL
ALT: 20 U/L (ref 0–44)
AST: 31 U/L (ref 15–41)
Albumin: 4.7 g/dL (ref 3.5–5.0)
Alkaline Phosphatase: 81 U/L (ref 38–126)
Anion gap: 20 — ABNORMAL HIGH (ref 5–15)
BUN: 7 mg/dL (ref 6–20)
CO2: 20 mmol/L — ABNORMAL LOW (ref 22–32)
Calcium: 10.5 mg/dL — ABNORMAL HIGH (ref 8.9–10.3)
Chloride: 96 mmol/L — ABNORMAL LOW (ref 98–111)
Creatinine, Ser: 1.25 mg/dL — ABNORMAL HIGH (ref 0.61–1.24)
GFR calc Af Amer: >60 mL/min (ref >60)
GFR calc non Af Amer: >60 mL/min (ref >60)
Glucose, Bld: 121 mg/dL — ABNORMAL HIGH (ref 70–99)
Potassium: 3.5 mmol/L (ref 3.5–5.1)
Sodium: 136 mmol/L (ref 135–145)
Total Bilirubin: 0.8 mg/dL (ref 0.3–1.2)
Total Protein: 7.2 g/dL (ref 6.5–8.1)

## 2019-08-10 LAB — LIPASE, BLOOD: Lipase: 29 U/L (ref 11–51)

## 2019-08-10 MED ORDER — ONDANSETRON 4 MG PO TBDP
4.0000 mg | ORAL_TABLET | Freq: Once | ORAL | Status: AC | PRN
  Administered 2019-08-10: 15:00:00 4 mg via ORAL
  Filled 2019-08-10: qty 1

## 2019-08-10 MED ORDER — SODIUM CHLORIDE 0.9% FLUSH: 3.0000 mL | Freq: Once | INTRAVENOUS | Status: DC

## 2019-08-10 NOTE — Telephone Encounter (Signed)
Phone rep checked voicemail, pt states the lamoTRIgine (LAMICTAL) 25 MG tablet makes him sick, he can not stop throwing up.  Pt unsure if it lamoTRIgine (LAMICTAL) 25 MG tablet clashes with other medications.  Please call

## 2019-08-10 NOTE — ED Notes (Signed)
Pt states he has to leave to go home to take care of his grandkids because his wife has to go to work.  Encouraged him to stay and he states he doesn't have a choice but to leave.

## 2019-08-10 NOTE — Telephone Encounter (Signed)
I called pt and he is in ED now.  He started the lamotrigine on Monday 25mg  po bid.  He started wit diarrhea on Monday, and n/v since then and having sweats (not sure if fever).  He went to urgent care they took him to ED.  Not sure if lamotrigine, but he will be evaluated.

## 2019-08-10 NOTE — ED Triage Notes (Signed)
Pt presents to [facility] with severe abdominal pain, nausea and emesis, visible tremors.  PAtient has a hx of alcohol dependency, states he has not been drinking.  Will send to ER for further evaluation due to physical appearance and history.

## 2019-08-10 NOTE — ED Triage Notes (Signed)
Patient complains of abd. Pain with vomiting since starting new seizure med on Monday, reports constipation with same. Dry heaves on arrival

## 2019-08-11 ENCOUNTER — Other Ambulatory Visit: Payer: Self-pay

## 2019-08-11 ENCOUNTER — Encounter (HOSPITAL_COMMUNITY): Payer: Self-pay

## 2019-08-11 ENCOUNTER — Emergency Department (HOSPITAL_COMMUNITY): Payer: Medicare HMO

## 2019-08-11 ENCOUNTER — Emergency Department (HOSPITAL_COMMUNITY)
Admission: EM | Admit: 2019-08-11 | Discharge: 2019-08-11 | Disposition: A | Discharge status: Home / Self Care | Payer: Medicare HMO | Attending: Emergency Medicine | Admitting: Emergency Medicine

## 2019-08-11 DIAGNOSIS — Z8669 Personal history of other diseases of the nervous system and sense organs: Secondary | ICD-10-CM | POA: Diagnosis not present

## 2019-08-11 DIAGNOSIS — K529 Noninfective gastroenteritis and colitis, unspecified: Secondary | ICD-10-CM | POA: Diagnosis not present

## 2019-08-11 DIAGNOSIS — K769 Liver disease, unspecified: Secondary | ICD-10-CM | POA: Diagnosis not present

## 2019-08-11 DIAGNOSIS — R21 Rash and other nonspecific skin eruption: Secondary | ICD-10-CM | POA: Diagnosis not present

## 2019-08-11 DIAGNOSIS — R112 Nausea with vomiting, unspecified: Secondary | ICD-10-CM | POA: Diagnosis not present

## 2019-08-11 DIAGNOSIS — R1084 Generalized abdominal pain: Secondary | ICD-10-CM | POA: Diagnosis present

## 2019-08-11 DIAGNOSIS — Z79899 Other long term (current) drug therapy: Secondary | ICD-10-CM | POA: Insufficient documentation

## 2019-08-11 DIAGNOSIS — F319 Bipolar disorder, unspecified: Secondary | ICD-10-CM | POA: Insufficient documentation

## 2019-08-11 DIAGNOSIS — F17210 Nicotine dependence, cigarettes, uncomplicated: Secondary | ICD-10-CM | POA: Diagnosis not present

## 2019-08-11 DIAGNOSIS — T50905A Adverse effect of unspecified drugs, medicaments and biological substances, initial encounter: Secondary | ICD-10-CM | POA: Insufficient documentation

## 2019-08-11 DIAGNOSIS — I10 Essential (primary) hypertension: Secondary | ICD-10-CM | POA: Diagnosis not present

## 2019-08-11 DIAGNOSIS — R1110 Vomiting, unspecified: Secondary | ICD-10-CM | POA: Diagnosis not present

## 2019-08-11 DIAGNOSIS — T426X5A Adverse effect of other antiepileptic and sedative-hypnotic drugs, initial encounter: Secondary | ICD-10-CM | POA: Diagnosis not present

## 2019-08-11 LAB — COMPREHENSIVE METABOLIC PANEL
ALT: 19 U/L (ref 0–44)
AST: 28 U/L (ref 15–41)
Albumin: 4.6 g/dL (ref 3.5–5.0)
Alkaline Phosphatase: 79 U/L (ref 38–126)
Anion gap: 16 — ABNORMAL HIGH (ref 5–15)
BUN: 6 mg/dL (ref 6–20)
CO2: 26 mmol/L (ref 22–32)
Calcium: 9.7 mg/dL (ref 8.9–10.3)
Chloride: 94 mmol/L — ABNORMAL LOW (ref 98–111)
Creatinine, Ser: 1.24 mg/dL (ref 0.61–1.24)
GFR calc Af Amer: >60 mL/min (ref >60)
GFR calc non Af Amer: >60 mL/min (ref >60)
Glucose, Bld: 130 mg/dL — ABNORMAL HIGH (ref 70–99)
Potassium: 3.4 mmol/L — ABNORMAL LOW (ref 3.5–5.1)
Sodium: 136 mmol/L (ref 135–145)
Total Bilirubin: 0.9 mg/dL (ref 0.3–1.2)
Total Protein: 7.3 g/dL (ref 6.5–8.1)

## 2019-08-11 LAB — URINALYSIS, ROUTINE W REFLEX MICROSCOPIC
Bilirubin Urine: NEGATIVE
Glucose, UA: NEGATIVE mg/dL
Hgb urine dipstick: NEGATIVE
Ketones, ur: 20 mg/dL — AB
Leukocytes,Ua: NEGATIVE
Nitrite: NEGATIVE
Protein, ur: NEGATIVE mg/dL
Specific Gravity, Urine: >1.046 — ABNORMAL HIGH (ref 1.005–1.030)
pH: 7 (ref 5.0–8.0)

## 2019-08-11 LAB — CBC
HCT: 46.8 % (ref 39.0–52.0)
Hemoglobin: 16.2 g/dL (ref 13.0–17.0)
MCH: 32.3 pg (ref 26.0–34.0)
MCHC: 34.6 g/dL (ref 30.0–36.0)
MCV: 93.2 fL (ref 80.0–100.0)
Platelets: 270 10*3/uL (ref 150–400)
RBC: 5.02 MIL/uL (ref 4.22–5.81)
RDW: 13.9 % (ref 11.5–15.5)
WBC: 11.4 10*3/uL — ABNORMAL HIGH (ref 4.0–10.5)
nRBC: 0 % (ref 0.0–0.2)

## 2019-08-11 LAB — LIPASE, BLOOD: Lipase: 29 U/L (ref 11–51)

## 2019-08-11 LAB — ETHANOL: Alcohol, Ethyl (B): <10 mg/dL (ref <10)

## 2019-08-11 MED ORDER — ONDANSETRON 4 MG PO TBDP: 4.0000 mg | ORAL_TABLET | Freq: Three times a day (TID) | ORAL | 0 refills | Status: DC | PRN

## 2019-08-11 MED ORDER — IOHEXOL 300 MG/ML  SOLN
100.0000 mL | Freq: Once | INTRAMUSCULAR | Status: AC | PRN
  Administered 2019-08-11: 100 mL via INTRAVENOUS

## 2019-08-11 MED ORDER — SODIUM CHLORIDE 0.9% FLUSH
3.0000 mL | Freq: Once | INTRAVENOUS | Status: AC
  Administered 2019-08-11: 3 mL via INTRAVENOUS

## 2019-08-11 MED ORDER — SODIUM CHLORIDE 0.9 % IV BOLUS
1000.0000 mL | Freq: Once | INTRAVENOUS | Status: AC
  Administered 2019-08-11: 15:00:00 1000 mL via INTRAVENOUS

## 2019-08-11 MED ORDER — DIPHENHYDRAMINE HCL 25 MG PO CAPS
25.0000 mg | ORAL_CAPSULE | Freq: Once | ORAL | Status: AC
  Administered 2019-08-11: 16:00:00 25 mg via ORAL
  Filled 2019-08-11: qty 1

## 2019-08-11 MED ORDER — DIPHENHYDRAMINE HCL 25 MG PO TABS: 25.0000 mg | ORAL_TABLET | Freq: Two times a day (BID) | ORAL | 0 refills | Status: DC

## 2019-08-11 NOTE — Discharge Instructions (Addendum)
You have been diagnosed today with gastroenteritis, liver lesion, adverse drug reaction.  At this time there does not appear to be the presence of an emergent medical condition, however there is always the potential for conditions to change. Please read and follow the below instructions.  Please return to the Emergency Department immediately for any new or worsening symptoms or if your symptoms do not improve within 2 days. Please be sure to follow up with your Primary Care Provider within one week regarding your visit today; please call their office to schedule an appointment even if you are feeling better for a follow-up visit. You may use the medication Zofran as prescribed to help with nausea and vomiting.  Please be sure to drink plenty of water and get plenty of rest over the next few days to avoid dehydration. Please stop using lamotrigine immediately as this may be a cause of your rash.  Continue taking Benadryl 25 mg twice daily for the next week to help with your rash.  See your primary care provider and neurologist within 2 days for recheck and return to the emergency department immediately for any new or worsening symptoms. Your CT scan today showed a lesion on your liver.  This will need to be further evaluated by an MRI scan.  Please call the GI specialists at [facility] gastroenterology tomorrow morning to schedule a follow-up appointment regarding this liver lesion and for further evaluation.  Please also discuss this finding with your primary care provider this week as they may also be able to schedule your MRI.  Get help right away if: Your pain does not go away as soon as your doctor says it should. You cannot stop throwing up. Your pain is only in areas of your belly, such as the right side or the left lower part of the belly. You have bloody or black poop, or poop that looks like tar. You have very bad pain, cramping, or bloating in your belly. You have signs of not having enough  fluid or water in your body (dehydration), such as: Dark pee, very little pee, or no pee. Cracked lips. Dry mouth. Sunken eyes. Sleepiness. Weakness. You start to feel mixed up (confused). You have peeling or redness or a funny feeling inside of your mouth or throat. You have a very bad headache or a stiff neck. You have very bad joint pains or stiffness. You have jerky movements that you cannot control (seizure). Your rash covers all or most of your body. The rash may or may not be painful. You have blisters that: Are on top of the rash. Grow larger. Grow together. Are painful. Are inside your nose or mouth. You have a rash that: Looks like purple pinprick-sized spots all over your body. Has a "bull's eye" or looks like a target. Is red and painful, causes your skin to peel, and is not from being in the sun too long. You have any new/concerning or worsening symptoms  Please read the additional information packets attached to your discharge summary.  Do not take your medicine if  develop an itchy rash, swelling in your mouth or lips, or difficulty breathing; call 911 and seek immediate emergency medical attention if this occurs.  Note: Portions of this text may have been transcribed using voice recognition software. Every effort was made to ensure accuracy; however, inadvertent computerized transcription errors may still be present.

## 2019-08-11 NOTE — ED Triage Notes (Signed)
Pt reports vomiting and abd pain since starting a new medication (Lamotrigine). Pt also reports eating some shrimp on Sunday evening and doesn't know if its related to that. Pt here yesterday but LWBS.

## 2019-08-11 NOTE — Telephone Encounter (Signed)
I spoke to pt he is still in the ED.  Not sure what is going on.  He will let us know.  I relayed to pt that to stop lamictal keep on keppra as of right now.  He verbalized understanding.

## 2019-08-11 NOTE — Telephone Encounter (Signed)
Please call the patient. Lets have him to stop the Lamictal (if he has not already), he should continue Keppra for seizure prevention. It seems unlikely that Lamictal 25 mg BID would cause such a reaction. Once he is feeling better we may try to restart. Looks like he might be in the ER currently. He LWBS yesterday.

## 2019-08-11 NOTE — ED Notes (Signed)
Pt aware of need for urine, states he is unable to go at this time.

## 2019-08-11 NOTE — ED Notes (Signed)
Patient transported to CT 

## 2019-08-11 NOTE — ED Provider Notes (Signed)
[facility] EMERGENCY DEPARTMENT Provider Note   CSN: [id number] Arrival date & time: 08/11/19  [id number]     History   Chief Complaint Chief Complaint  Patient presents with   Abdominal Pain   Emesis    HPI Spencer Mendoza is a 58 y.o. male with history of bipolar, epilepsy, hypertension, migraine, CVA, substance abuse, EtOH abuse presents today for abdominal pain nausea vomiting diarrhea and rash.  Patient reports that on Sunday evening he ate shrimp that he found in his refrigerator.  He reports that he was unaware of it at that time but shrimp had been in the refrigerator for greater than 12 days.  He reports that on Sunday night he developed abdominal pain and diarrhea.  He describes a generalized abdominal pain a severe cramping sensation constant without aggravating or alleviating symptoms or radiation.  Abdominal pain has been constant for the past 4 days.  Patient reports that on Monday morning he began taking a new medication prescribed by his neurologist, Lamictal for seizures in addition to his normal Keppra.  He reports that shortly after taking the Lamictal on Monday morning his diarrhea improved but was followed by nausea and vomiting.  He describes multiple episodes of nonbloody/nonbilious emesis over the past 3 days with continued abdominal pain.  Patient reports that on Tuesday the day after he began taking Lamictal he developed a diffuse red rash to his torso that has remained constant since that time.  Reports feeling warm and cold but denies measured fever at home.  Denies headache/vision changes, neck pain, difficulty swallowing, feeling of throat closing, burning of the mouth, chest pain/shortness of breath, dysuria/hematuria, fall/injury or any additional concerns.     HPI  Past Medical History:  Diagnosis Date   Allergy    Anxiety    Bipolar disorder ([place])    Chronic insomnia 06/06/2015   Depression    Epilepsy ([place])    Hemiplegic  migraine with status migrainosus 06/06/2015   Hypertension    Migraine    Stroke [facility])    patient denies   Substance abuse [facility])     Patient Active Problem List   Diagnosis Date Noted   MDD (major depressive disorder), recurrent severe, without psychosis ([place]) 11/18/2018   Major depressive disorder, recurrent episode, severe ([place]) 01/30/2018   Substance induced mood disorder ([place]) 01/30/2018   MDD (major depressive disorder), single episode, severe , no psychosis ([place]) 01/29/2018   Hemiplegic migraine with status migrainosus 06/06/2015   Chronic insomnia 06/06/2015   Cocaine abuse ([place]) 05/22/2015   Alcohol dependence with uncomplicated withdrawal ([place])    Headache, migraine    Migraines 11/29/2014   Headache 08/08/2014   Seizures ([place]) 01/09/2014   Chronic low back pain 12/28/2013   Hemiplegia, unspecified, affecting dominant side 12/06/2013   CVA (cerebral infarction) 12/04/2013   Leukocytosis 12/04/2013   Acute Gastroenteritis 12/04/2013   Hypertension 12/04/2013   Hemiparesis ([place]) 12/04/2013   Delirium tremens ([place]) 07/29/2013   Alcohol withdrawal ([place]) 07/29/2013   UGIB (upper gastrointestinal bleed) 07/29/2013   Chest pain 07/28/2013    Past Surgical History:  Procedure Laterality Date   APPENDECTOMY     KNEE ARTHROSCOPY Right    NOSE SURGERY          Home Medications    Prior to Admission medications   Medication Sig Start Date End Date Taking? Authorizing Provider  albuterol (PROVENTIL HFA;VENTOLIN HFA) 108 (90 Base) MCG/ACT inhaler Inhale 2 puffs into the lungs every 4 (four)  hours as needed for shortness of breath or wheezing. 05/01/18   [provider]  diphenhydrAMINE (BENADRYL) 25 MG tablet Take 1 tablet (25 mg total) by mouth 2 (two) times daily. 08/11/19   Nuala Alpha A, PA-C  folic acid (FOLVITE) 1 MG tablet Take 1 tablet (1 mg total) by mouth daily. 05/19/19   Suzzanne Cloud, NP  gabapentin (NEURONTIN) 300 MG  capsule Take 300 mg by mouth 3 (three) times daily.    [provider]  Galcanezumab-gnlm (EMGALITY) 120 MG/ML SOAJ Inject 240 mg into the skin every 30 (thirty) days. 05/24/19   Suzzanne Cloud, NP  lamoTRIgine (LAMICTAL) 25 MG tablet Lamotrigine Titration Instructions with 25 mg tablets to achieve 100 mg twice daily  Start taking 1 tablet (25 mg) twice daily for 2 weeks Then take 2 tablets (50 mg) twice daily for 2 weeks  Then take 3 tablets (75 mg) twice daily for 2 weeks  Call our office upon completion, and I will send in a new prescription 08/08/19   Suzzanne Cloud, NP  levETIRAcetam (KEPPRA) 500 MG tablet TAKE 1 TABLET(500 MG) BY MOUTH TWICE DAILY 05/19/19   Suzzanne Cloud, NP  magnesium oxide (MAG-OX) 400 MG tablet Take 400 mg by mouth daily.    [provider]  naltrexone (DEPADE) 50 MG tablet Take 1 tablet (50 mg total) by mouth daily. 05/19/19   Suzzanne Cloud, NP  ondansetron (ZOFRAN ODT) 4 MG disintegrating tablet Take 1 tablet (4 mg total) by mouth every 8 (eight) hours as needed for nausea or vomiting. 08/11/19   Nuala Alpha A, PA-C  thiamine 100 MG tablet Take 1 tablet (100 mg total) by mouth daily. 05/19/19   Suzzanne Cloud, NP    Family History Family History  Problem Relation Age of Onset   Heart disease Mother    Stroke Mother    Epilepsy Mother    Heart disease Father    Hypertension Father    Melanoma Father    Migraines Father    Colon cancer Sister    Colon cancer Cousin     Social History Social History   Tobacco Use   Smoking status: Current Every Day Smoker    Packs/day: 1.00    Years: 15.00    Pack years: 15.00    Types: Cigarettes   Smokeless tobacco: Never Used  Substance Use Topics   Alcohol use: Yes    Alcohol/week: 6.0 standard drinks    Types: 6 Cans of beer per week    Comment: pt reports no ETOH and no durgs in 3 moths.    Drug use: Yes    Frequency: 3.0 times per week    Types: Marijuana, Heroin, Cocaine     Comment: crack, heroin pt reports no ETOH and no durgs in 3 moths.      Allergies   Codeine, Depakote [divalproex sodium], Migranal [dihydroergotamine], and Topamax [topiramate]   Review of Systems Review of Systems Ten systems are reviewed and are negative for acute change except as noted in the HPI   Physical Exam Updated Vital Signs BP 126/75 (BP Location: Right Arm)    Pulse 60    Temp 97.7 F (36.5 C) (Oral)    Resp 16    Ht 6' (1.829 m)    Wt 77.1 kg    SpO2 100%    BMI 23.06 kg/m   Physical Exam Constitutional:      General: He is not in acute distress.  Appearance: Normal appearance. He is well-developed. He is not ill-appearing or diaphoretic.  HENT:     Head: Normocephalic and atraumatic.     Jaw: There is normal jaw occlusion. No trismus.     Right Ear: External ear normal.     Left Ear: External ear normal.     Nose: Nose normal.     Mouth/Throat:     Mouth: Mucous membranes are moist.     Pharynx: Oropharynx is clear.     Comments: Normal-appearing mucosa Eyes:     General: Vision grossly intact. Gaze aligned appropriately.     Pupils: Pupils are equal, round, and reactive to light.  Neck:     Musculoskeletal: Normal range of motion.     Trachea: Trachea and phonation normal. No tracheal deviation.  Pulmonary:     Effort: Pulmonary effort is normal. No respiratory distress.  Abdominal:     General: There is no distension.     Palpations: Abdomen is soft.     Tenderness: There is generalized abdominal tenderness. There is no guarding or rebound.  Musculoskeletal: Normal range of motion.  Skin:    General: Skin is warm and dry.     Findings: Rash present.     Comments: Diffuse erythematous nonpruritic nonraised rash of the anterior torso.  Neurological:     Mental Status: He is alert.     GCS: GCS eye subscore is 4. GCS verbal subscore is 5. GCS motor subscore is 6.     Comments: Speech is clear and goal oriented, follows commands Major Cranial nerves  without deficit, no facial droop Moves extremities without ataxia, coordination intact  Psychiatric:        Behavior: Behavior normal.    ED Treatments / Results  Labs (all labs ordered are listed, but only abnormal results are displayed) Labs Reviewed  COMPREHENSIVE METABOLIC PANEL - Abnormal; Notable for the following components:      Result Value   Potassium 3.4 (*)    Chloride 94 (*)    Glucose, Bld 130 (*)    Anion gap 16 (*)    All other components within normal limits  CBC - Abnormal; Notable for the following components:   WBC 11.4 (*)    All other components within normal limits  URINALYSIS, ROUTINE W REFLEX MICROSCOPIC - Abnormal; Notable for the following components:   Specific Gravity, Urine >1.046 (*)    Ketones, ur 20 (*)    All other components within normal limits  LIPASE, BLOOD  ETHANOL    EKG None  Radiology Ct Abdomen Pelvis W Contrast  Result Date: 08/11/2019 CLINICAL DATA:  58 year old male with nausea vomiting and abdominal pain EXAM: CT ABDOMEN AND PELVIS WITH CONTRAST TECHNIQUE: Multidetector CT imaging of the abdomen and pelvis was performed using the standard protocol following bolus administration of intravenous contrast. CONTRAST:  171mL OMNIPAQUE IOHEXOL 300 MG/ML  SOLN COMPARISON:  January 03, 2019 FINDINGS: Lower chest: Atelectatic changes at the lung bases with no acute abnormality. Hepatobiliary: Rounded hypodense lesion in segment 7, image 12 series 3. Lesion measures 16 mm in greatest diameter, and was not present or visualized on the prior noncontrast CT. There is differential enhancement pattern with less distinct border on the delayed CT images. No other lesions identified. Unremarkable gallbladder. Pancreas: Unremarkable Spleen: Unremarkable Adrenals/Urinary Tract: Unremarkable appearance of the adrenal glands. No evidence of hydronephrosis of the right or left kidney. No nephrolithiasis. Unremarkable course of the bilateral ureters.  Unremarkable appearance of the urinary bladder.  Stomach/Bowel: Unremarkable stomach. Small bowel unremarkable. Appendix is not visualized, however, no inflammatory changes are present adjacent to the cecum to indicate an appendicitis. Unremarkable colon. No inflammatory changes. No significant stool burden. No obstruction.  No inflammatory changes. Vascular/Lymphatic: Atherosclerotic changes of the abdominal aorta. No aneurysm or dissection. Mesenteric and renal arteries patent. No adenopathy. Reproductive: Unremarkable Other: None Musculoskeletal: No acute displaced fracture. Mild degenerative changes of L4-L5. Degenerative changes of the hips IMPRESSION: No acute CT finding to account for abdominal pain. There is a small hypodense/hypoenhancing lesion within segment 7 of the liver which was not present on the comparison noncontrast CT. The enhancement characteristics are suspicious, and while this could represent a vascular lesion, malignancy (either primary or metastatic) cannot be excluded. Nonemergent follow-up contrast-enhanced liver protocol MRI is recommended. These results were discussed by telephone at the time of interpretation on 08/11/2019 at 5:21 pm with Dr. Nuala Alpha. Aortic Atherosclerosis (ICD10-I70.0). Electronically Signed   By: Corrie Mckusick D.O.   On: 08/11/2019 17:22    Procedures Procedures (including critical care time)  Medications Ordered in ED Medications  sodium chloride flush (NS) 0.9 % injection 3 mL (3 mLs Intravenous Given 08/11/19 1452)  sodium chloride 0.9 % bolus 1,000 mL (0 mLs Intravenous Stopped 08/11/19 1611)  diphenhydrAMINE (BENADRYL) capsule 25 mg (25 mg Oral Given 08/11/19 1605)  iohexol (OMNIPAQUE) 300 MG/ML solution 100 mL (100 mLs Intravenous Contrast Given 08/11/19 1639)     Initial Impression / Assessment and Plan / ED Course  I have reviewed the triage vital signs and the nursing notes.  Pertinent labs & imaging results that were available during my  care of the patient were reviewed by me and considered in my medical decision making (see chart for details).  Clinical Course as of Aug 10 1929  Thu Aug 11, 2019  1630 Benadryl bid x 1 week then prn.   [BM]    Clinical Course User Index [BM] Deliah Boston, PA-C   On initial evaluation patient is overall well-appearing and in no acute distress, not actively vomiting.  He has mild abdominal tenderness generalized without peritoneal signs.  He has a diffuse mild erythematous rash of the anterior torso.  He has no airway or mucosal involvement.  Discussed case with Dr. Roslynn Amble, discussed possible etiologies such as squamoid poisoning, feel this is unlikely based on history and physical examination but will give Benadryl today.  He did begin taking Lamictal just prior to onset of his rash, review of notes from [facility] neurology show that they recommended that he stop taking Lamictal today but continue with his Keppra. - Patient seen and evaluated by Dr. Roslynn Amble advises that if no acute findings on CT abdomen pelvis will discharge with Benadryl and outpatient follow-up. - CBC with extensions of 11.4 Lipase within normal limits CMP with anion gap 16, suspect secondary to dehydration Urinalysis with ketones and elevated specific gravity, suspect secondary to dehydration Ethanol negative, patient does not appear to be in alcohol withdrawal  CT abdomen pelvis:  IMPRESSION:  No acute CT finding to account for abdominal pain.    There is a small hypodense/hypoenhancing lesion within segment 7 of  the liver which was not present on the comparison noncontrast CT.  The enhancement characteristics are suspicious, and while this could  represent a vascular lesion, malignancy (either primary or  metastatic) cannot be excluded. Nonemergent follow-up  contrast-enhanced liver protocol MRI is recommended. These results  were discussed by telephone at the time of interpretation on  08/11/2019 at 5:21  pm with Dr. Nuala Alpha.    Aortic Atherosclerosis (ICD10-I70.0).  - Discussed case with in-house pharmacy who advises that patient take Benadryl 25 mg twice daily x7 days for his rash and discontinue lamotrigine, follow-up for recheck early next week. - Patient reevaluated resting comfortably and in no acute distress.  On reevaluation abdomen is soft without peritoneal signs.  Patient has been updated on results today and he states understanding of need for follow-up.  He will be given referral to gastroenterologist for further evaluation encouraged to call office tomorrow morning.  He will be discharged with Zofran for nausea and vomiting.  Patient advised to stop taking lamotrigine immediately and to call his neurologist tomorrow morning to advise them of this.  He has Keppra at home that he will continue taking for his symptoms.  Patient does not appear to be having Stevens-Johnson syndrome at this time, he is overall well-appearing and in no acute distress.  He is advised to take Benadryl 25 mg twice daily x7 days and to follow-up with neurologist and PCP on Monday.   On reassessment patient denies any difficulty breathing or swallowing.  Pt has a patent airway without stridor and is handling secretions without difficulty; no angioedema. No blisters, no pustules, no warmth, no draining sinus tracts, no superficial abscesses, no bullous impetigo, no vesicles, no desquamation, no target lesions with dusky purpura or a central bulla. Not tender to touch. No concern for superimposed infection. No concern for SJS, TEN, TSS, tick borne illness, syphilis or other life-threatening condition at this time.   At this time there does not appear to be any evidence of an acute emergency medical condition and the patient appears stable for discharge with appropriate outpatient follow up. Diagnosis was discussed with patient who verbalizes understanding of care plan and is agreeable to discharge. I have  discussed return precautions with patient who verbalizes understanding of return precautions. Patient encouraged to follow-up with their PCP, GI and neuro. All questions answered.  Patient's case rediscussed with Dr. Roslynn Amble who agrees with plan to discharge with follow-up.   Note: Portions of this report may have been transcribed using voice recognition software. Every effort was made to ensure accuracy; however, inadvertent computerized transcription errors may still be present. Final Clinical Impressions(s) / ED Diagnoses   Final diagnoses:  Gastroenteritis  Liver lesion  Adverse effect of drug, initial encounter    ED Discharge Orders         Ordered    ondansetron (ZOFRAN ODT) 4 MG disintegrating tablet  Every 8 hours PRN     08/11/19 1929    diphenhydrAMINE (BENADRYL) 25 MG tablet  2 times daily     08/11/19 1929           Gari Crown 08/11/19 1931    Lucrezia Starch, MD 08/13/19 [phone]

## 2019-08-12 ENCOUNTER — Telehealth: Payer: Self-pay | Admitting: Gastroenterology

## 2019-08-12 NOTE — Telephone Encounter (Signed)
The pt says he has a direct appt with Dr Ardis Hughs but was told he has an abnormality in the liver seen on CT scan.  He was advised to follow the steps provided by the ordering MD.  Pt agreed.

## 2019-08-12 NOTE — Telephone Encounter (Signed)
Pt is scheduled for a colon but reported that a liver mass was found on CT.  Pt would like to know plan of care.

## 2019-08-15 ENCOUNTER — Telehealth: Payer: Self-pay | Admitting: Neurology

## 2019-08-15 NOTE — Telephone Encounter (Signed)
I received a note from psychiatry, that he refused their services. I referred him to psych after last visit. He has reported depression, anxiety. I do think it would be beneficial for him to reconsider. Please discuss with patient. Also, ensure he is no longer taking Lamictal. Per review of the ER note, he developed a red rash to his trunk. Please ensure the rash is resolving, improving. Have him follow-up with Dr. Jannifer Franklin for revisit please.

## 2019-08-15 NOTE — Telephone Encounter (Addendum)
I reached out to the pt and we were able to schedule his f/u for 08/24/2019 at 730 with Dr. Jannifer Franklin.

## 2019-08-15 NOTE — Telephone Encounter (Signed)
Angel called for [facility] at Patient refused services . [id number] . I called  And told him that he will need to call and schedule  his apt when he is ready. Patient relayed he has some other  things he needs to take care of first and he will call and schedule his PSY apt.

## 2019-08-15 NOTE — Telephone Encounter (Signed)
I called pt checking on him.  He stated that stopped ;lamictal, taking benadryl helped some (lightened rash) still there though.  He stated they found from CT abdomen a mass on liver.  Has MRI scheduled on 09-01-19 and is prioritizing to do first then will go to psychiatry for depression/anxiety.  Pt ok to cancel SS/NP appt and see Dr. Jannifer Franklin.  Will forward to MB/RN to call pt and schedule.

## 2019-08-25 ENCOUNTER — Encounter: Payer: Self-pay | Admitting: Neurology

## 2019-08-25 ENCOUNTER — Other Ambulatory Visit: Payer: Self-pay

## 2019-08-25 ENCOUNTER — Ambulatory Visit (INDEPENDENT_AMBULATORY_CARE_PROVIDER_SITE_OTHER): Payer: Medicare HMO | Admitting: Neurology

## 2019-08-25 VITALS — BP 102/70 | HR 54 | Temp 96.2°F | Ht 72.0 in | Wt 169.0 lb

## 2019-08-25 DIAGNOSIS — G43711 Chronic migraine without aura, intractable, with status migrainosus: Secondary | ICD-10-CM

## 2019-08-25 DIAGNOSIS — R569 Unspecified convulsions: Secondary | ICD-10-CM

## 2019-08-25 HISTORY — DX: Chronic migraine without aura, intractable, with status migrainosus: G43.711

## 2019-08-25 MED ORDER — LEVETIRACETAM 750 MG PO TABS: 750.0000 mg | ORAL_TABLET | Freq: Two times a day (BID) | ORAL | 1 refills | Status: DC

## 2019-08-25 MED ORDER — TRAZODONE HCL 150 MG PO TABS: 150.0000 mg | ORAL_TABLET | Freq: Every day | ORAL | 3 refills | Status: DC

## 2019-08-25 NOTE — Patient Instructions (Signed)
We will go up on the trazodone to 150 mg at night for sleep.  We will increase the keppra to 750 mg twice a day.  We will try to get botox for treatment of migraine.

## 2019-08-25 NOTE — Progress Notes (Signed)
Reason for visit: Migraine headaches, seizures, history of alcohol abuse and marijuana abuse  Spencer Mendoza is an 58 y.o. male  History of present illness:  Spencer Mendoza is a 58 year old right-handed white male with a history of episodic seizures associated with generalized tonic-clonic events and occasional urinary incontinence.  The patient has had significant issues with alcohol abuse, he may drink up to 12 beers a day.  He has been in the hospital in June, and on 17 September with seizures, he was in the hospital on 11 August 2019 with gastroenteritis, a CT scan of the abdomen at that time showed a low-density area in the liver that was new, he will be evaluated for possible liver cancer in the near future.  The patient has a history of bipolar disorder, he is not followed through psychiatry.  He reports a lot of problems with claustrophobia and GAD.  He is not sleeping well, he indicates that severe insomnia may worsen his seizure control.  He is on trazodone 100 mg at night but this is not helpful.  The patient continues to have headaches almost daily or every other day, he may have a severe headache at least once a week.  He has been on a multitude of medications in the past for migraine including propranolol, amitriptyline, Topamax, Depakote, Effexor, gabapentin, Seroquel, Prozac, and Aimovig.  None of these modalities have been effective in controlling his headache.  The patient returns for an evaluation.  Past Medical History:  Diagnosis Date  . Allergy   . Anxiety   . Bipolar disorder ([place])   . Chronic insomnia 06/06/2015  . Depression   . Epilepsy ([place])   . Hemiplegic migraine with status migrainosus 06/06/2015  . Hypertension   . Migraine   . Stroke [facility])    patient denies  . Substance abuse [facility])     Past Surgical History:  Procedure Laterality Date  . APPENDECTOMY    . KNEE ARTHROSCOPY Right   . NOSE SURGERY      Family History  Problem Relation Age of  Onset  . Heart disease Mother   . Stroke Mother   . Epilepsy Mother   . Heart disease Father   . Hypertension Father   . Melanoma Father   . Migraines Father   . Colon cancer Sister   . Colon cancer Cousin     Social history:  reports that he has been smoking cigarettes. He has a 15.00 pack-year smoking history. He has never used smokeless tobacco. He reports current alcohol use of about 6.0 standard drinks of alcohol per week. He reports current drug use. Frequency: 3.00 times per week. Drugs: Marijuana, Heroin, and Cocaine.    Allergies  Allergen Reactions  . Codeine Nausea And Vomiting  . Depakote [Divalproex Sodium] Other (See Comments)    Made patient shake  . Migranal [Dihydroergotamine] Nausea And Vomiting  . Topamax [Topiramate] Other (See Comments)    Made patient angry    Medications:  Prior to Admission medications   Medication Sig Start Date End Date Taking? Authorizing Provider  albuterol (PROVENTIL HFA;VENTOLIN HFA) 108 (90 Base) MCG/ACT inhaler Inhale 2 puffs into the lungs every 4 (four) hours as needed for shortness of breath or wheezing. 05/01/18  Yes [provider]  diphenhydrAMINE (BENADRYL) 25 MG tablet Take 1 tablet (25 mg total) by mouth 2 (two) times daily. 08/11/19  Yes Nuala Alpha A, PA-C  levETIRAcetam (KEPPRA) 500 MG tablet TAKE 1 TABLET(500 MG) BY MOUTH  TWICE DAILY 05/19/19  Yes Suzzanne Cloud, NP  ondansetron (ZOFRAN ODT) 4 MG disintegrating tablet Take 1 tablet (4 mg total) by mouth every 8 (eight) hours as needed for nausea or vomiting. 08/11/19  Yes Nuala Alpha A, PA-C  thiamine 100 MG tablet Take 1 tablet (100 mg total) by mouth daily. 05/19/19  Yes Suzzanne Cloud, NP    ROS:  Out of a complete 14 system review of symptoms, the patient complains only of the following symptoms, and all other reviewed systems are negative.  Anxiety Insomnia  Blood pressure 102/70, pulse (!) 54, temperature (!) 96.2 F (35.7 C), temperature source  Oral, height 6' (1.829 m), weight 169 lb (76.7 kg).  Physical Exam  General: The patient is alert and cooperative at the time of the examination.  Skin: No significant peripheral edema is noted.   Neurologic Exam  Mental status: The patient is alert and oriented x 3 at the time of the examination. The patient has apparent normal recent and remote memory, with an apparently normal attention span and concentration ability.   Cranial nerves: Facial symmetry is present. Speech is normal, no aphasia or dysarthria is noted. Extraocular movements are full. Visual fields are full.  Motor: The patient has good strength in all 4 extremities.  Sensory examination: Soft touch sensation is symmetric on the face, arms, and legs.  Coordination: The patient has good finger-nose-finger and heel-to-shin bilaterally.  Gait and station: The patient has a normal gait. Tandem gait is normal. Romberg is negative. No drift is seen.  Reflexes: Deep tendon reflexes are symmetric.   Assessment/Plan:  1.  History of seizures  2.  Alcohol abuse, marijuana abuse  3.  Chronic insomnia  4.  Chronic intractable migraine  5.  Anxiety, bipolar disorder  The patient will be increased on the trazodone to 150 mg at night, he will call for any dose adjustments.  He will be set up for Botox therapy.  The patient will go up on the Keppra dosing to 750 mg twice daily.  He will follow-up here in 6 months, likely be seen sooner for Botox therapy.  Jill Alexanders MD 08/25/2019 7:32 AM  [facility] [address] [place], [place] [address]  Phone [phone] Fax [phone]

## 2019-08-30 ENCOUNTER — Encounter: Payer: Self-pay | Admitting: Critical Care Medicine

## 2019-08-30 ENCOUNTER — Other Ambulatory Visit: Payer: Self-pay

## 2019-08-30 ENCOUNTER — Telehealth: Payer: Self-pay | Admitting: General Practice

## 2019-08-30 ENCOUNTER — Telehealth: Payer: Self-pay | Admitting: *Deleted

## 2019-08-30 ENCOUNTER — Ambulatory Visit: Payer: Medicare HMO | Attending: Critical Care Medicine | Admitting: Critical Care Medicine

## 2019-08-30 VITALS — BP 108/69 | HR 54 | Temp 97.4°F | Ht 72.0 in | Wt 166.6 lb

## 2019-08-30 DIAGNOSIS — F1190 Opioid use, unspecified, uncomplicated: Secondary | ICD-10-CM

## 2019-08-30 DIAGNOSIS — G8929 Other chronic pain: Secondary | ICD-10-CM

## 2019-08-30 DIAGNOSIS — K769 Liver disease, unspecified: Secondary | ICD-10-CM | POA: Diagnosis not present

## 2019-08-30 DIAGNOSIS — G43711 Chronic migraine without aura, intractable, with status migrainosus: Secondary | ICD-10-CM

## 2019-08-30 DIAGNOSIS — F1310 Sedative, hypnotic or anxiolytic abuse, uncomplicated: Secondary | ICD-10-CM | POA: Insufficient documentation

## 2019-08-30 DIAGNOSIS — J449 Chronic obstructive pulmonary disease, unspecified: Secondary | ICD-10-CM | POA: Insufficient documentation

## 2019-08-30 DIAGNOSIS — R945 Abnormal results of liver function studies: Secondary | ICD-10-CM

## 2019-08-30 DIAGNOSIS — F5104 Psychophysiologic insomnia: Secondary | ICD-10-CM

## 2019-08-30 DIAGNOSIS — R109 Unspecified abdominal pain: Secondary | ICD-10-CM | POA: Diagnosis not present

## 2019-08-30 DIAGNOSIS — F1994 Other psychoactive substance use, unspecified with psychoactive substance-induced mood disorder: Secondary | ICD-10-CM

## 2019-08-30 DIAGNOSIS — F10230 Alcohol dependence with withdrawal, uncomplicated: Secondary | ICD-10-CM

## 2019-08-30 DIAGNOSIS — Z114 Encounter for screening for human immunodeficiency virus [HIV]: Secondary | ICD-10-CM

## 2019-08-30 DIAGNOSIS — R569 Unspecified convulsions: Secondary | ICD-10-CM

## 2019-08-30 DIAGNOSIS — F1410 Cocaine abuse, uncomplicated: Secondary | ICD-10-CM

## 2019-08-30 DIAGNOSIS — R7989 Other specified abnormal findings of blood chemistry: Secondary | ICD-10-CM

## 2019-08-30 DIAGNOSIS — F1199 Opioid use, unspecified with unspecified opioid-induced disorder: Secondary | ICD-10-CM

## 2019-08-30 DIAGNOSIS — F332 Major depressive disorder, recurrent severe without psychotic features: Secondary | ICD-10-CM

## 2019-08-30 DIAGNOSIS — Z720 Tobacco use: Secondary | ICD-10-CM | POA: Insufficient documentation

## 2019-08-30 DIAGNOSIS — I10 Essential (primary) hypertension: Secondary | ICD-10-CM | POA: Diagnosis not present

## 2019-08-30 DIAGNOSIS — Z8673 Personal history of transient ischemic attack (TIA), and cerebral infarction without residual deficits: Secondary | ICD-10-CM

## 2019-08-30 HISTORY — DX: Opioid use, unspecified, uncomplicated: F11.90

## 2019-08-30 HISTORY — DX: Opioid use, unspecified with unspecified opioid-induced disorder: F11.99

## 2019-08-30 HISTORY — DX: Sedative, hypnotic or anxiolytic abuse, uncomplicated: F13.10

## 2019-08-30 HISTORY — DX: Unspecified abdominal pain: R10.9

## 2019-08-30 MED ORDER — ONDANSETRON 4 MG PO TBDP: 4.0000 mg | ORAL_TABLET | Freq: Three times a day (TID) | ORAL | 0 refills | Status: DC | PRN

## 2019-08-30 MED ORDER — THIAMINE HCL 100 MG PO TABS: 100.0000 mg | ORAL_TABLET | Freq: Every day | ORAL | 3 refills | Status: DC

## 2019-08-30 MED ORDER — NICOTINE POLACRILEX 4 MG MT LOZG: LOZENGE | OROMUCOSAL | 4 refills | Status: DC

## 2019-08-30 MED ORDER — BUDESONIDE-FORMOTEROL FUMARATE 160-4.5 MCG/ACT IN AERO: 2.0000 | INHALATION_SPRAY | Freq: Two times a day (BID) | RESPIRATORY_TRACT | 12 refills | Status: DC

## 2019-08-30 MED ORDER — FOLIC ACID 1 MG PO TABS: 1.0000 mg | ORAL_TABLET | Freq: Every day | ORAL | 3 refills | Status: DC

## 2019-08-30 MED ORDER — SERTRALINE HCL 100 MG PO TABS: 100.0000 mg | ORAL_TABLET | Freq: Every day | ORAL | 3 refills | Status: DC

## 2019-08-30 MED ORDER — ALBUTEROL SULFATE HFA 108 (90 BASE) MCG/ACT IN AERS: 2.0000 | INHALATION_SPRAY | RESPIRATORY_TRACT | 1 refills | Status: DC | PRN

## 2019-08-30 NOTE — Assessment & Plan Note (Signed)
History of previous stroke without persistent hemiplegia

## 2019-08-30 NOTE — Assessment & Plan Note (Signed)
Tobacco use with probable underlying COPD  Recommend nicotine replacement lozenges

## 2019-08-30 NOTE — Assessment & Plan Note (Signed)
Liver lesion seen on CT scan of abdomen  Obtain follow-up MRI of the liver

## 2019-08-30 NOTE — Assessment & Plan Note (Signed)
Previous history of cocaine use

## 2019-08-30 NOTE — Telephone Encounter (Signed)
Per dr Joya Gaskins call the patient

## 2019-08-30 NOTE — Telephone Encounter (Signed)
I am actually not sure why he was sent a reminder letter to have a colonoscopy recently when he does not appear to be 'due' for one until 2021.  I see a letter that I must have authorized telling him that he was due for a colonoscopy (sent 05/2019) however I think that may have been a mistake. Any way to look into that?  Can my signed recall form letter be retrieved?  For now, let him know we are looking into this and cancel his currently scheduled colonoscopy and previsit.  thanks

## 2019-08-30 NOTE — Assessment & Plan Note (Signed)
Chronic low back pain with resultant chronic opioid use

## 2019-08-30 NOTE — Assessment & Plan Note (Signed)
Chronic insomnia now on trazodone

## 2019-08-30 NOTE — Assessment & Plan Note (Signed)
Ongoing benzodiazepine use off the street without prescription  Psychiatry referral made

## 2019-08-30 NOTE — Assessment & Plan Note (Signed)
Recurrent seizures treatment per neurology

## 2019-08-30 NOTE — Progress Notes (Signed)
Subjective:    Patient ID: Spencer Mendoza, male    DOB: [birth year], 58 y.o.   MRN: [id number]  This is a 58 year old white male history of episodic seizures associated with tonic-clonic events and urinary incontinence.  History also of heavy ethanol use and severe generalized anxiety disorder with claustrophobia.  Patient has significant insomnia as well.  The patient has also chronic history of migraines been on multiple medications for this in the past as well.  Patient has a history of substance use including opioids in the past as well.  Patient also uses marijuana at this time.  Patient has history of bipolar disorder and severe depression.  Patient presents today post emergency room visit to establish for primary care.  The patient had been in this clinic previously 5 years ago and wishes to come back to the clinic now to establish.  There is also prior history of stroke.  Also history of nausea vomiting and diarrhea in the past.  The patient was seen in the emergency room on 24 September for similar complaints.  Below is the office visit with neurology on October 8  08/25/19 OV with Neurology  Spencer Mendoza is a 58 year old right-handed white male with a history of episodic seizures associated with generalized tonic-clonic events and occasional urinary incontinence.  The patient has had significant issues with alcohol abuse, he may drink up to 12 beers a day.  He has been in the hospital in June, and on 17 September with seizures, he was in the hospital on 11 August 2019 with gastroenteritis, a CT scan of the abdomen at that time showed a low-density area in the liver that was new, he will be evaluated for possible liver cancer in the near future.  The patient has a history of bipolar disorder, he is not followed through psychiatry.  He reports a lot of problems with claustrophobia and GAD.  He is not sleeping well, he indicates that severe insomnia may worsen his seizure control.  He is on  trazodone 100 mg at night but this is not helpful.  The patient continues to have headaches almost daily or every other day, he may have a severe headache at least once a week.  He has been on a multitude of medications in the past for migraine including propranolol, amitriptyline, Topamax, Depakote, Effexor, gabapentin, Seroquel, Prozac, and Aimovig.  None of these modalities have been effective in controlling his headache.  The patient returns for an evaluation.   1.  History of seizures  2.  Alcohol abuse, marijuana abuse  3.  Chronic insomnia  4.  Chronic intractable migraine  5.  Anxiety, bipolar disorder  The patient will be increased on the trazodone to 150 mg at night, he will call for any dose adjustments.  He will be set up for Botox therapy.  The patient will go up on the Keppra dosing to 750 mg twice daily.  He will follow-up here in 6 months, likely be seen sooner for Botox therapy.  Below is the emergency room visit September 24 In ED 9/24: Spencer Mendoza is a 58 y.o. male with history of bipolar, epilepsy, hypertension, migraine, CVA, substance abuse, EtOH abuse presents today for abdominal pain nausea vomiting diarrhea and rash.  Patient reports that on Sunday evening he ate shrimp that he found in his refrigerator.  He reports that he was unaware of it at that time but shrimp had been in the refrigerator for greater than 12 days.  He  reports that on Sunday night he developed abdominal pain and diarrhea.  He describes a generalized abdominal pain a severe cramping sensation constant without aggravating or alleviating symptoms or radiation.  Abdominal pain has been constant for the past 4 days.  Patient reports that on Monday morning he began taking a new medication prescribed by his neurologist, Lamictal for seizures in addition to his normal Keppra.  He reports that shortly after taking the Lamictal on Monday morning his diarrhea improved but was followed by nausea and  vomiting.  He describes multiple episodes of nonbloody/nonbilious emesis over the past 3 days with continued abdominal pain.  Patient reports that on Tuesday the day after he began taking Lamictal he developed a diffuse red rash to his torso that has remained constant since that time.  Reports feeling warm and cold but denies measured fever at home.  Denies headache/vision changes, neck pain, difficulty swallowing, feeling of throat closing, burning of the mouth, chest pain/shortness of breath, dysuria/hematuria, fall/injury or any additional concerns.   On initial evaluation patient is overall well-appearing and in no acute distress, not actively vomiting.  He has mild abdominal tenderness generalized without peritoneal signs.  He has a diffuse mild erythematous rash of the anterior torso.  He has no airway or mucosal involvement.  Discussed case with Dr. Roslynn Amble, discussed possible etiologies such as squamoid poisoning, feel this is unlikely based on history and physical examination but will give Benadryl today.  He did begin taking Lamictal just prior to onset of his rash, review of notes from [facility] neurology show that they recommended that he stop taking Lamictal today but continue with his Keppra. - Patient seen and evaluated by Dr. Roslynn Amble advises that if no acute findings on CT abdomen pelvis will discharge with Benadryl and outpatient follow-up. - CBC with extensions of 11.4 Lipase within normal limits CMP with anion gap 16, suspect secondary to dehydration Urinalysis with ketones and elevated specific gravity, suspect secondary to dehydration Ethanol negative, patient does not appear to be in alcohol withdrawal  CT abdomen pelvis:  IMPRESSION: No acute CT finding to account for abdominal pain.  There is a small hypodense/hypoenhancing lesion within segment 7 of the liver which was not present on the comparison noncontrast CT. The enhancement characteristics are suspicious,  and while this could represent a vascular lesion, malignancy (either primary or metastatic) cannot be excluded. Nonemergent follow-up contrast-enhanced liver protocol MRI is recommended. These results were discussed by telephone at the time of interpretation on 08/11/2019 at 5:21 pm with Dr. Nuala Alpha.  Aortic Atherosclerosis (ICD10-I70.0). - Discussed case with in-house pharmacy who advises that patient take Benadryl 25 mg twice daily x7 days for his rash and discontinue lamotrigine, follow-up for recheck early next week. - Patient reevaluated resting comfortably and in no acute distress.  On reevaluation abdomen is soft without peritoneal signs.  Patient has been updated on results today and he states understanding of need for follow-up.  He will be given referral to gastroenterologist for further evaluation encouraged to call office tomorrow morning.  He will be discharged with Zofran for nausea and vomiting.  Patient advised to stop taking lamotrigine immediately and to call his neurologist tomorrow morning to advise them of this.  He has Keppra at home that he will continue taking for his symptoms.  Patient does not appear to be having Stevens-Johnson syndrome at this time, he is overall well-appearing and in no acute distress.  He is advised to take Benadryl 25 mg twice daily  x7 days and to follow-up with neurologist and PCP on Monday.  Note the patient does need a follow-up MRI of the liver because of the liver lesion seen which could represent a malignancy or metastatic lesion.  Contrast-enhanced liver protocol MRI was recommended.  The patient now has been sober for 3 weeks.  Is not drinking alcohol currently.  This patient had been previously in mental health programs for drug substance use and depression.  He has been on multiple depression medications in the past.  He has been on trazodone for sleep in the past.  He also has been on Prozac.  He also used IV heroin in the past and  also cocaine and Dilaudid in the past.  He does obtain off the street Xanax and uses this as needed at home.   Past Medical History:  Diagnosis Date   Alcohol withdrawal ([place]) 07/29/2013   Allergy    Anxiety    Bipolar disorder ([place])    Chronic insomnia 06/06/2015   Chronic migraine without aura, with intractable migraine, so stated, with status migrainosus 08/25/2019   Delirium tremens ([place]) 07/29/2013   Depression    Epilepsy ([place])    Hemiplegic migraine with status migrainosus 06/06/2015   Hypertension    Migraine    Stroke [facility])    patient denies   Substance abuse ([place])      Family History  Problem Relation Age of Onset   Heart disease Mother    Stroke Mother    Epilepsy Mother    Heart disease Father    Hypertension Father    Melanoma Father    Migraines Father    Colon cancer Sister    Colon cancer Cousin      Social History   Socioeconomic History   Marital status: Divorced    Spouse name: Not on file   Number of children: 0   Years of education: GED   Highest education level: Not on file  Occupational History   Occupation: disabled  Social Designer, fashion/clothing strain: Not on file   Food insecurity    Worry: Not on file    Inability: Not on file   Transportation needs    Medical: Not on file    Non-medical: Not on file  Tobacco Use   Smoking status: Current Every Day Smoker    Packs/day: 1.00    Years: 15.00    Pack years: 15.00    Types: Cigarettes   Smokeless tobacco: Never Used  Substance and Sexual Activity   Alcohol use: Yes    Alcohol/week: 6.0 standard drinks    Types: 6 Cans of beer per week    Comment: pt reports no ETOH and no durgs in 3 moths.    Drug use: Yes    Frequency: 3.0 times per week    Types: Marijuana, Heroin, Cocaine    Comment: crack, heroin pt reports no ETOH and no durgs in 3 moths.    Sexual activity: Yes    Birth control/protection: Condom  Lifestyle   Physical activity     Days per week: Not on file    Minutes per session: Not on file   Stress: Not on file  Relationships   Social connections    Talks on phone: Not on file    Gets together: Not on file    Attends religious service: Not on file    Active member of club or organization: Not on file    Attends meetings of clubs  or organizations: Not on file    Relationship status: Not on file   Intimate partner violence    Fear of current or ex partner: Not on file    Emotionally abused: Not on file    Physically abused: Not on file    Forced sexual activity: Not on file  Other Topics Concern   Not on file  Social History Narrative   Pt lives with sister.   Patient drinks 2 cups of caffeine daily.   Patient is right handed.     Allergies  Allergen Reactions   Codeine Nausea And Vomiting   Depakote [Divalproex Sodium] Other (See Comments)    Made patient shake   Migranal [Dihydroergotamine] Nausea And Vomiting   Topamax [Topiramate] Other (See Comments)    Made patient angry     Outpatient Medications Prior to Visit  Medication Sig Dispense Refill   diphenhydrAMINE (BENADRYL) 25 MG tablet Take 1 tablet (25 mg total) by mouth 2 (two) times daily. 20 tablet 0   levETIRAcetam (KEPPRA) 750 MG tablet Take 1 tablet (750 mg total) by mouth 2 (two) times daily. 180 tablet 1   traZODone (DESYREL) 150 MG tablet Take 1 tablet (150 mg total) by mouth at bedtime. 30 tablet 3   albuterol (PROVENTIL HFA;VENTOLIN HFA) 108 (90 Base) MCG/ACT inhaler Inhale 2 puffs into the lungs every 4 (four) hours as needed for shortness of breath or wheezing.  0   ondansetron (ZOFRAN ODT) 4 MG disintegrating tablet Take 1 tablet (4 mg total) by mouth every 8 (eight) hours as needed for nausea or vomiting. 12 tablet 0   thiamine 100 MG tablet Take 1 tablet (100 mg total) by mouth daily. (Patient not taking: Reported on 08/30/2019) 30 tablet 3   No facility-administered medications prior to visit.      Review of  Systems Constitutional:    weight loss, night sweats,  Fevers, chills, fatigue, lassitude. HEENT:   No headaches,  Difficulty swallowing,  Tooth/dental problems,  Sore throat,                No sneezing, itching, ear ache, nasal congestion, post nasal drip,   CV:  No chest pain,  Orthopnea, PND, swelling in lower extremities, anasarca, dizziness, palpitations  GI   heartburn, indigestion, abdominal pain, nausea, vomiting, diarrhea, change in bowel habits, loss of appetite  Resp:  shortness of breath with exertion or at rest.  No excess mucus, no productive cough,  No non-productive cough,  No coughing up of blood.  No change in color of mucus.  No wheezing.  No chest wall deformity  Skin: no rash or lesions.  GU: no dysuria, change in color of urine, no urgency or frequency.  No flank pain.  MS:  No joint pain or swelling.  No decreased range of motion.  No back pain.  Psych:   change in mood or affect.  depression or anxiety.  No memory loss.     Objective:   Physical Exam Vitals:   08/30/19 1347  BP: 108/69  Pulse: (!) 54  Temp: (!) 97.4 F (36.3 C)  TempSrc: Oral  SpO2: 96%  Weight: 166 lb 9.6 oz (75.6 kg)  Height: 6' (1.829 m)    Gen: Pleasant, well-nourished, in no distress, depressed l affect  ENT: No lesions,  mouth clear,  oropharynx clear, no postnasal drip  Neck: No JVD, no TMG, no carotid bruits  Lungs: No use of accessory muscles, no dullness to percussion, distant breath sounds  Cardiovascular: RRR, heart sounds normal, no murmur or gallops, no peripheral edema  Abdomen: soft and NT, no HSM,  BS normal  Musculoskeletal: No deformities, no cyanosis or clubbing  Neuro: alert, non focal  Skin: Warm, no lesions or rashes  08/11/19 CT abdomen pelvis:   IMPRESSION: No acute CT finding to account for abdominal pain.  Hepatobiliary: Rounded hypodense lesion in segment 7, image 12 series 3. Lesion measures 16 mm in greatest diameter, and was not present  or visualized on the prior noncontrast CT. There is differential enhancement pattern with less distinct border on the delayed CT images.  There is a small hypodense/hypoenhancing lesion within segment 7 of the liver which was not present on the comparison noncontrast CT. The enhancement characteristics are suspicious, and while this could represent a vascular lesion, malignancy (either primary or metastatic) cannot be excluded. Nonemergent follow-up contrast-enhanced liver protocol MRI is recommended. These results were discussed by telephone at the time of interpretation on     Assessment & Plan:  I personally reviewed all images and lab data in the [facility] system as well as any outside material available during this office visit and agree with the  radiology impressions.   Hypertension History of hypertension currently not requiring treatment  We will monitor  Alcohol dependence with uncomplicated withdrawal ([place]) History of alcohol dependence with withdrawal in the past  Will continue thiamine [id number] mg daily and folic acid 1 mg daily  We will connect with our clinical social worker for continued alcohol treatment recommendations  History of stroke History of previous stroke without persistent hemiplegia  Abnormal LFTs Abnormal liver function test with previous alcohol use  Follow-up metabolic panel  Benzodiazepine abuse ([place]) Ongoing benzodiazepine use off the street without prescription  Psychiatry referral made  Chronic low back pain Chronic low back pain with resultant chronic opioid use  Cocaine abuse ([facility]) Previous history of cocaine use  Flank pain Bilateral flank pain  Obtain urinalysis  Chronic insomnia Chronic insomnia now on trazodone  Liver lesion, right lobe Liver lesion seen on CT scan of abdomen  Obtain follow-up MRI of the liver  MDD (major depressive disorder), recurrent severe, without psychosis ([place]) Major recurrent depressive disorder with  associated severe anxiety  Begin sertraline 100 mg daily and refer to psychiatry  Seizures Recurrent seizures treatment per neurology  Tobacco use Tobacco use with probable underlying COPD  Recommend nicotine replacement lozenges  COPD with chronic bronchitis ([facility]) Chronic obstructive lung disease with ongoing tobacco use  Continue albuterol as needed and begin Symbicort 2 puffs twice daily   Spencer Mendoza was seen today for hospitalization follow-up and establish care.  Diagnoses and all orders for this visit:  Liver lesion, right lobe -     Cancel: MR LIVER W CONTRAST; Future -     Hepatitis C antibody -     Hep B Core Ab W/Reflex -     MR LIVER W WO CONTRAST  MDD (major depressive disorder), recurrent severe, without psychosis ([place]) -     Ambulatory referral to Psychiatry  Alcohol dependence with uncomplicated withdrawal ([place]) -     Comprehensive metabolic panel -     CBC with Differential/Platelet; Future -     Drug Screen 12+Alcohol+CRT, Ur -     Ambulatory referral to Psychiatry -     CBC with Differential/Platelet  Essential hypertension -     CBC with Differential/Platelet; Future -     CBC with Differential/Platelet  Opioid use disorder (HCC) -  Ambulatory referral to Psychiatry  Benzodiazepine abuse ([place]) -     Ambulatory referral to Psychiatry  Encounter for screening for HIV -     HIV Antibody (routine testing w rflx)  Flank pain -     Urinalysis  Substance induced mood disorder ([facility])  Seizures ([facility]CC)  Chronic bilateral low back pain without sciatica  Severe episode of recurrent major depressive disorder, without psychotic features ([place])  Chronic migraine without aura, with intractable migraine, so stated, with status migrainosus  History of stroke  Abnormal LFTs  Cocaine abuse ([facility])  Chronic insomnia  Tobacco use  COPD with chronic bronchitis ([place])  Other orders -     sertraline (ZOLOFT) 100 MG tablet; Take 1 tablet (100 mg total) by  mouth daily. -     budesonide-formoterol (SYMBICORT) 160-4.5 MCG/ACT inhaler; Inhale 2 puffs into the lungs 2 (two) times daily. -     albuterol (VENTOLIN HFA) 108 (90 Base) MCG/ACT inhaler; Inhale 2 puffs into the lungs every 4 (four) hours as needed for shortness of breath or wheezing. -     thiamine 100 MG tablet; Take 1 tablet (100 mg total) by mouth daily. -     folic acid (FOLVITE) 1 MG tablet; Take 1 tablet (1 mg total) by mouth daily. -     nicotine polacrilex (NICORETTE MINI) 4 MG lozenge; Use one 4 times daily to stop smoking -     Discontinue: ondansetron (ZOFRAN ODT) 4 MG disintegrating tablet; Take 1 tablet (4 mg total) by mouth every 8 (eight) hours as needed for nausea or vomiting. -     ondansetron (ZOFRAN ODT) 4 MG disintegrating tablet; Take 1 tablet (4 mg total) by mouth every 8 (eight) hours as needed for nausea or vomiting.

## 2019-08-30 NOTE — Telephone Encounter (Signed)
Dr Ardis Hughs,  This pt has a hx of seizures and according to his chart his last sz was 08-04-2019 with an ED visit- he is on Keppra and Maybe Lamictal at this time- he has a hx of drug abuse and Alcohol abuse as well- bipolar- He has a PV for 10-14 and a colon with you 10-+28- Do you want to proceed as scheduled after he is evaluated in PV or OV.  please advise, thanks Lelan Pons

## 2019-08-30 NOTE — Assessment & Plan Note (Signed)
History of hypertension currently not requiring treatment  We will monitor

## 2019-08-30 NOTE — Assessment & Plan Note (Signed)
Bilateral flank pain  Obtain urinalysis

## 2019-08-30 NOTE — Assessment & Plan Note (Signed)
Chronic obstructive lung disease with ongoing tobacco use  Continue albuterol as needed and begin Symbicort 2 puffs twice daily

## 2019-08-30 NOTE — Assessment & Plan Note (Signed)
Major recurrent depressive disorder with associated severe anxiety  Begin sertraline 100 mg daily and refer to psychiatry

## 2019-08-30 NOTE — Assessment & Plan Note (Signed)
History of alcohol dependence with withdrawal in the past  Will continue thiamine [id number] mg daily and folic acid 1 mg daily  We will connect with our clinical social worker for continued alcohol treatment recommendations

## 2019-08-30 NOTE — Patient Instructions (Addendum)
Refills on Zofran was sent to the pharmacy Begin Symbicort 2 inhalations twice daily and use albuterol as needed Begin sertraline 100 mg daily for depression and anxiety Begin folic acid daily Stay on thiamine daily Avoid xanax from street sources Stop smoking using nicotine lozenges Referral to psychiatry was made  MRI will be obtained for liver lesion  Labs today include metabolic panel blood count HIV hepatitis C hepatitis B and drug screen studies also urinalysis  See Dr Joya Gaskins in one month  Our licensed clinical social worker also will contact you

## 2019-08-30 NOTE — Assessment & Plan Note (Signed)
Abnormal liver function test with previous alcohol use  Follow-up metabolic panel

## 2019-08-31 ENCOUNTER — Telehealth: Payer: Self-pay | Admitting: Critical Care Medicine

## 2019-08-31 ENCOUNTER — Ambulatory Visit (AMBULATORY_SURGERY_CENTER): Payer: Self-pay | Admitting: *Deleted

## 2019-08-31 VITALS — Temp 96.8°F | Ht 72.0 in | Wt 165.8 lb

## 2019-08-31 DIAGNOSIS — R768 Other specified abnormal immunological findings in serum: Secondary | ICD-10-CM

## 2019-08-31 DIAGNOSIS — Z800 Family history of malignant neoplasm of digestive organs: Secondary | ICD-10-CM

## 2019-08-31 LAB — COMPREHENSIVE METABOLIC PANEL
ALT: 16 IU/L (ref 0–44)
AST: 21 IU/L (ref 0–40)
Albumin/Globulin Ratio: 2.2 (ref 1.2–2.2)
Albumin: 4.8 g/dL (ref 3.8–4.9)
Alkaline Phosphatase: 86 IU/L (ref 39–117)
BUN/Creatinine Ratio: 12 (ref 10–24)
BUN: 14 mg/dL (ref 8–27)
Bilirubin Total: 0.4 mg/dL (ref 0.0–1.2)
CO2: 23 mmol/L (ref 20–29)
Calcium: 10 mg/dL (ref 8.6–10.2)
Chloride: 101 mmol/L (ref 96–106)
Creatinine, Ser: 1.17 mg/dL (ref 0.76–1.27)
GFR calc Af Amer: 78 mL/min/{1.73_m2} (ref >59)
GFR calc non Af Amer: 67 mL/min/{1.73_m2} (ref >59)
Globulin, Total: 2.2 g/dL (ref 1.5–4.5)
Glucose: 78 mg/dL (ref 65–99)
Potassium: 4.3 mmol/L (ref 3.5–5.2)
Sodium: 138 mmol/L (ref 134–144)
Total Protein: 7 g/dL (ref 6.0–8.5)

## 2019-08-31 LAB — URINALYSIS
Bilirubin, UA: NEGATIVE
Glucose, UA: NEGATIVE
Ketones, UA: NEGATIVE
Leukocytes,UA: NEGATIVE
Nitrite, UA: NEGATIVE
Protein,UA: NEGATIVE
RBC, UA: NEGATIVE
Specific Gravity, UA: 1.025 (ref 1.005–1.030)
Urobilinogen, Ur: 0.2 mg/dL (ref 0.2–1.0)
pH, UA: 5.5 (ref 5.0–7.5)

## 2019-08-31 LAB — CBC WITH DIFFERENTIAL/PLATELET
Basophils Absolute: 0.1 10*3/uL (ref 0.0–0.2)
Basos: 1 %
EOS (ABSOLUTE): 0.4 10*3/uL (ref 0.0–0.4)
Eos: 5 %
Hematocrit: 41.1 % (ref 37.5–51.0)
Hemoglobin: 14.3 g/dL (ref 13.0–17.7)
Immature Grans (Abs): 0 10*3/uL (ref 0.0–0.1)
Immature Granulocytes: 0 %
Lymphocytes Absolute: 2.5 10*3/uL (ref 0.7–3.1)
Lymphs: 35 %
MCH: 31.7 pg (ref 26.6–33.0)
MCHC: 34.8 g/dL (ref 31.5–35.7)
MCV: 91 fL (ref 79–97)
Monocytes Absolute: 0.6 10*3/uL (ref 0.1–0.9)
Monocytes: 8 %
Neutrophils Absolute: 3.6 10*3/uL (ref 1.4–7.0)
Neutrophils: 51 %
Platelets: 298 10*3/uL (ref 150–450)
RBC: 4.51 x10E6/uL (ref 4.14–5.80)
RDW: 13.4 % (ref 11.6–15.4)
WBC: 7.2 10*3/uL (ref 3.4–10.8)

## 2019-08-31 LAB — HIV ANTIBODY (ROUTINE TESTING W REFLEX): HIV Screen 4th Generation wRfx: NONREACTIVE

## 2019-08-31 LAB — HEPATITIS C ANTIBODY: Hep C Virus Ab: >11 s/co ratio — ABNORMAL HIGH (ref 0.0–0.9)

## 2019-08-31 LAB — HBCIGM: Hep B C IgM: NEGATIVE

## 2019-08-31 LAB — HEPATITIS B CORE AB W/REFLEX: Hep B Core Total Ab: POSITIVE — AB

## 2019-08-31 NOTE — Telephone Encounter (Signed)
Spoke with and and informed him with what provider stated and appt was made and number for Dr. Adele Schilder was given and date and time of appt for Dr. Adele Schilder was given as well

## 2019-08-31 NOTE — Telephone Encounter (Signed)
I contacted the patient regarding his lab results he does have positive hepatitis B and C antibodies and will need further testing he will come in for a lab test at his scheduled time the nurse will contact him with that lab appointment time  Apparently on the referral to psychiatry he has an existing appointment with Dr. Adele Schilder I am going to ask the our nurse to contact the patient with that appointment time so he understands when the appointment is to occur,

## 2019-08-31 NOTE — Progress Notes (Signed)
PATIENT AND FRIEND CAME IN FOR PV TODAY. PER PHONE NOTE FROM DR.JACOBS PATIENT IS NOT DUE FOR RECALL COLONOSCOPY UNTIL 05/2020. PT IS NOTIFIED AND I APOLOGIZED FOR THE MISTAKE. PT WAS VERY HAPPY NOT TO HAVE TO DO THIS AT THIS TIME. HE DENIES ANY GI CONCERNS OR ISSUES. HE IS HAVING MRI FOR LIVER CONCERNS SO IS GLAD TO ONLY FOCUS ON THAT AT THIS TIME. PT IS IN Epic FOR RECALL COLON FOR 05/2020.

## 2019-08-31 NOTE — Telephone Encounter (Signed)
Patient and "significant other" here in [place] was notified of the recommendations from Dr.Jacobs. Colon cancelled and new recall placed in Epic for recall colon 05/2020- pt is aware.

## 2019-09-02 ENCOUNTER — Other Ambulatory Visit: Payer: Self-pay

## 2019-09-02 ENCOUNTER — Ambulatory Visit: Payer: Medicare HMO | Attending: Family Medicine

## 2019-09-02 DIAGNOSIS — R768 Other specified abnormal immunological findings in serum: Secondary | ICD-10-CM | POA: Diagnosis not present

## 2019-09-02 NOTE — Telephone Encounter (Signed)
Call placed to patient to follow up on [facility] referral. LCSW left message requesting a return call

## 2019-09-03 ENCOUNTER — Ambulatory Visit (HOSPITAL_COMMUNITY): Payer: Medicare HMO | Admitting: Psychiatry

## 2019-09-04 LAB — HCV RNA QUANT: Hepatitis C Quantitation: NOT DETECTED IU/mL

## 2019-09-05 ENCOUNTER — Telehealth: Payer: Self-pay | Admitting: Neurology

## 2019-09-05 LAB — DRUG SCREEN 12+ALCOHOL+CRT, UR
Amphetamines, Urine: NEGATIVE ng/mL
BENZODIAZ UR QL: NEGATIVE ng/mL
Barbiturate: NEGATIVE ng/mL
Cannabinoids: POSITIVE — AB
Cocaine (Metabolite): NEGATIVE ng/mL
Creatinine, Urine: 267.6 mg/dL (ref 20.0–300.0)
Ethanol, Urine: NEGATIVE %
Meperidine: NEGATIVE ng/mL
Methadone: NEGATIVE ng/mL
OPIATE SCREEN URINE: NEGATIVE ng/mL
Oxycodone/Oxymorphone, Urine: NEGATIVE ng/mL
Phencyclidine: NEGATIVE ng/mL
Propoxyphene: NEGATIVE ng/mL
Tramadol: NEGATIVE ng/mL

## 2019-09-05 MED ORDER — TRAZODONE HCL 100 MG PO TABS: 200.0000 mg | ORAL_TABLET | Freq: Every day | ORAL | 2 refills | Status: DC

## 2019-09-05 NOTE — Telephone Encounter (Signed)
The patient is still not sleeping, we will go up on the trazodone to 200 mg at night.

## 2019-09-05 NOTE — Telephone Encounter (Signed)
Phone rep checked office voicemail, pt states the dose of the traZODone (DESYREL) 150 MG tablet is not working, he is asking for an increase.  Pt still uses [employer] [facility] [phone] .   this voicemail was left @9 :16a.m.

## 2019-09-05 NOTE — Addendum Note (Signed)
Addended by: Kathrynn Ducking on: 09/05/2019 02:17 PM   Modules accepted: Orders

## 2019-09-06 ENCOUNTER — Telehealth: Payer: Self-pay

## 2019-09-06 NOTE — Telephone Encounter (Signed)
PA for Emgality has been approved.   (Key: [id number])  This request has been approved.  Please note any additional information provided by [facility] at the bottom of your screen. Effective dates 09/06/19- 11/16/2020

## 2019-09-08 NOTE — Telephone Encounter (Signed)
Pt states that he also got the notification about the  Emgality .  Pt is wanting to know if Dr Jannifer Franklin is wanting to go with the [facility] or the Botox please call

## 2019-09-08 NOTE — Telephone Encounter (Signed)
I called the patient.  The patient had been on Aimovig without benefit, I think that the chances of Emgality helping him are low therefore, I would prefer that the patient go on Botox as I believe that this would have been much higher chance of being beneficial for his headache.  We have already try to get him set up for this, I will try to find out where we are in the process of getting this medication approved.

## 2019-09-12 ENCOUNTER — Telehealth: Payer: Self-pay | Admitting: Critical Care Medicine

## 2019-09-12 NOTE — Telephone Encounter (Signed)
Spencer Mendoza form the Radiology department called to check on the status of the prior auth for the MRI.  Please f/u

## 2019-09-12 NOTE — Telephone Encounter (Signed)
I called and schedule this patient for Botox injections. DW

## 2019-09-13 ENCOUNTER — Ambulatory Visit (HOSPITAL_COMMUNITY): Payer: Medicare HMO

## 2019-09-13 ENCOUNTER — Ambulatory Visit: Payer: Medicare HMO | Attending: Family Medicine | Admitting: Licensed Clinical Social Worker

## 2019-09-13 ENCOUNTER — Other Ambulatory Visit: Payer: Self-pay

## 2019-09-13 DIAGNOSIS — F332 Major depressive disorder, recurrent severe without psychotic features: Secondary | ICD-10-CM

## 2019-09-13 DIAGNOSIS — Z598 Other problems related to housing and economic circumstances: Secondary | ICD-10-CM

## 2019-09-13 DIAGNOSIS — Z599 Problem related to housing and economic circumstances, unspecified: Secondary | ICD-10-CM

## 2019-09-13 DIAGNOSIS — F419 Anxiety disorder, unspecified: Secondary | ICD-10-CM

## 2019-09-14 ENCOUNTER — Encounter: Payer: Medicare HMO | Admitting: Gastroenterology

## 2019-09-14 NOTE — Telephone Encounter (Signed)
Prior Spencer Mendoza is completed with [employer]. Auth number is [id number].

## 2019-09-15 NOTE — BH Specialist Note (Signed)
Integrated Behavioral Health Initial Visit  MRN: [id number] Name: Spencer Mendoza  Number of [place] Clinician visits:: 1/6 Session Start time: 2:35 PM  Session End time: 3:05 AM Total time: 30  Type of Service: [place] Interpretor:No. Interpretor Name and Language: NA   SUBJECTIVE: Spencer Mendoza is a 58 y.o. male accompanied by Partner/Significant Other Patient was referred by Dr. Joya Gaskins for substance use resources. Patient reports the following symptoms/concerns: Pt reports previous diagnosis of Bipolar Depression and Anxiety. States that he has demonstrated withdrawn behavior and has had difficulty sleeping. Pt is coping with chronic medical conditions and has maintained sobriety from alcohol for 5 weeks Duration of problem: Ongoing; Severity of problem: severe  OBJECTIVE: Mood: Anxious and Affect: Appropriate Risk of harm to self or others: No plan to harm self or others  LIFE CONTEXT: Family and Social: Pt receives strong support from partner who is present during visit. Her father and pt's sister are additional supports School/Work: Pt receives disability and medicare. He has pending application for medicaid Self-Care: Pt is five weeks sober from alcohol, partner is six weeks sober. He has been referred to psychiatry to address benzodiazepine abuse and MDD, per chart review Life Changes: Pt has difficulty managing mental health conditions triggered by chronic medical conditions and recent sobriety from alcohol   GOALS ADDRESSED: Patient will: 1. Reduce symptoms of: anxiety and depression 2. Increase knowledge and/or ability of: coping skills and healthy habits  3. Demonstrate ability to: Increase healthy adjustment to current life circumstances and Increase adequate support systems for patient/family  INTERVENTIONS: Interventions utilized: Solution-Focused Strategies, Supportive Counseling, Psychoeducation and/or  Health Education and Link to [employer]  Standardized Assessments completed: Not Needed  ASSESSMENT: Patient currently experiencing depression and anxiety triggered by chronic medical conditions and upcoming MRI of liver. He has maintained sobriety for five weeks. Pt receives strong support from partner and family.    Patient has been referred to psychiatry to address benzodiazepine abuse and MDD, per chart review. LCSW commended pt and partner on their sobriety. Supportive resources were provided; however, pt is not interested in AA or outpatient tx. Pt was successful in identifying healthy coping skills (visiting [place] [place], cooking, praying) LCSW provided family with information on [employer] program to assist with past due utilities/rent, in addition, to community resources to assist with behavioral and outpatient substance use tx.  Pt is participating in medication management through PCP; however, is concerned with erectile dysfunction on current regimen. LCSW will inform PCP of medication concerns.    PLAN: 1. Follow up with behavioral health clinician on : Schedule follow up appt to address behavioral health and/or resource needs 2. Behavioral recommendations: Comply with medication management, utilize healthy coping skills discussed, and supportive resources provided 3. Referral(s): Integrated Orthoptist (In Clinic) and [employer] Resources:  Finances and Housing 4. "From scale of 1-10, how likely are you to follow plan?":   Rebekah Chesterfield, LCSW 09/16/2019 3:33 PM

## 2019-09-18 ENCOUNTER — Telehealth: Payer: Self-pay | Admitting: Critical Care Medicine

## 2019-09-18 NOTE — Telephone Encounter (Signed)
I contacted this patient who is having erectile dysfunction and this is a known side effect of the sertraline.  He states he was not having this before starting sertraline.  I indicated to him he needs to discontinue the sertraline.  I would like to get him in with a mental health specialist and apparently they said it would have to be a telemedicine visit but the patient does not have a cell phone.  I told the patient they could connect with him at home using his home phone and I would again make an attempt to get a referral in for him.  He does have [employer] Medicare so does have insurance coverage.

## 2019-09-19 ENCOUNTER — Telehealth: Payer: Self-pay | Admitting: Critical Care Medicine

## 2019-09-19 ENCOUNTER — Other Ambulatory Visit: Payer: Self-pay | Admitting: Critical Care Medicine

## 2019-09-19 ENCOUNTER — Telehealth: Payer: Self-pay | Admitting: *Deleted

## 2019-09-19 ENCOUNTER — Ambulatory Visit: Payer: Medicare HMO | Admitting: Neurology

## 2019-09-19 MED ORDER — ALPRAZOLAM 1 MG PO TABS: 2.0000 mg | ORAL_TABLET | Freq: Once | ORAL | 0 refills | Status: AC

## 2019-09-19 MED ORDER — ALPRAZOLAM 1 MG PO TABS: 1.0000 mg | ORAL_TABLET | Freq: Once | ORAL | 0 refills | Status: DC

## 2019-09-19 NOTE — Progress Notes (Signed)
Rx for Xanax sent to pharmacy for preop for MRI

## 2019-09-19 NOTE — Telephone Encounter (Signed)
Spoke with patient and informed him that his Xanax will be called into his pharmacy on file and he verbalized understanding and agreed. Staff called [facility] and spoke with Ronalee Belts the pharmacist and gived a verbal order to him per Dr. Bettina Gavia permission.

## 2019-09-19 NOTE — Telephone Encounter (Signed)
The patient states he cannot afford the sedation protocol from radiology therefore he would like a prescription for Xanax to take prior to his MRI of his liver.  I told the patient I would send a prescription of Xanax sent to [facility] for him to take 30 minutes prior to his procedure.

## 2019-09-20 ENCOUNTER — Ambulatory Visit (HOSPITAL_COMMUNITY)
Admission: RE | Admit: 2019-09-20 | Discharge: 2019-09-20 | Disposition: A | Discharge status: Home / Self Care | Payer: Medicare HMO | Source: Ambulatory Visit | Attending: Critical Care Medicine | Admitting: Critical Care Medicine

## 2019-09-20 ENCOUNTER — Other Ambulatory Visit: Payer: Self-pay

## 2019-09-20 DIAGNOSIS — K769 Liver disease, unspecified: Secondary | ICD-10-CM | POA: Diagnosis not present

## 2019-09-20 MED ORDER — GADOBUTROL 1 MMOL/ML IV SOLN
10.0000 mL | Freq: Once | INTRAVENOUS | Status: AC | PRN
  Administered 2019-09-20: 10 mL via INTRAVENOUS

## 2019-09-21 ENCOUNTER — Encounter: Payer: Self-pay | Admitting: Critical Care Medicine

## 2019-09-21 DIAGNOSIS — D1803 Hemangioma of intra-abdominal structures: Secondary | ICD-10-CM | POA: Insufficient documentation

## 2019-10-01 NOTE — Progress Notes (Signed)
Subjective:    Patient ID: Spencer Mendoza, male    DOB: 1961/08/29, 58 y.o.   MRN: [id number]  This is a 59 year old white male history of episodic seizures associated with tonic-clonic events and urinary incontinence.  History also of heavy ethanol use and severe generalized anxiety disorder with claustrophobia.  Patient has significant insomnia as well.  The patient has also chronic history of migraines been on multiple medications for this in the past as well.  Patient has a history of substance use including opioids in the past as well.  Patient also uses marijuana at this time.  Patient has history of bipolar disorder and severe depression.  Patient presents today post emergency room visit to establish for primary care.  The patient had been in this clinic previously 5 years ago and wishes to come back to the clinic now to establish.  There is also prior history of stroke.  Also history of nausea vomiting and diarrhea in the past.  The patient was seen in the emergency room on 24 September for similar complaints.  Below is the office visit with neurology on October 8  08/25/19 OV with Neurology Spencer Mendoza is a 58 year old right-handed white male with a history of episodic seizures associated with generalized tonic-clonic events and occasional urinary incontinence.  The patient has had significant issues with alcohol abuse, he may drink up to 12 beers a day.  He has been in the hospital in June, and on 17 September with seizures, he was in the hospital on 11 August 2019 with gastroenteritis, a CT scan of the abdomen at that time showed a low-density area in the liver that was new, he will be evaluated for possible liver cancer in the near future.  The patient has a history of bipolar disorder, he is not followed through psychiatry.  He reports a lot of problems with claustrophobia and GAD.  He is not sleeping well, he indicates that severe insomnia may worsen his seizure control.  He is on  trazodone 100 mg at night but this is not helpful.  The patient continues to have headaches almost daily or every other day, he may have a severe headache at least once a week.  He has been on a multitude of medications in the past for migraine including propranolol, amitriptyline, Topamax, Depakote, Effexor, gabapentin, Seroquel, Prozac, and Aimovig.  None of these modalities have been effective in controlling his headache.  The patient returns for an evaluation.   1.  History of seizures  2.  Alcohol abuse, marijuana abuse  3.  Chronic insomnia  4.  Chronic intractable migraine  5.  Anxiety, bipolar disorder  The patient will be increased on the trazodone to 150 mg at night, he will call for any dose adjustments.  He will be set up for Botox therapy.  The patient will go up on the Keppra dosing to 750 mg twice daily.  He will follow-up here in 6 months, likely be seen sooner for Botox therapy.  Below is the emergency room visit September 24 In ED 9/24: Spencer Mendoza is a 58 y.o. male with history of bipolar, epilepsy, hypertension, migraine, CVA, substance abuse, EtOH abuse presents today for abdominal pain nausea vomiting diarrhea and rash.  Patient reports that on Sunday evening he ate shrimp that he found in his refrigerator.  He reports that he was unaware of it at that time but shrimp had been in the refrigerator for greater than 12 days.  He reports  that on Sunday night he developed abdominal pain and diarrhea.  He describes a generalized abdominal pain a severe cramping sensation constant without aggravating or alleviating symptoms or radiation.  Abdominal pain has been constant for the past 4 days.  Patient reports that on Monday morning he began taking a new medication prescribed by his neurologist, Lamictal for seizures in addition to his normal Keppra.  He reports that shortly after taking the Lamictal on Monday morning his diarrhea improved but was followed by nausea and  vomiting.  He describes multiple episodes of nonbloody/nonbilious emesis over the past 3 days with continued abdominal pain.  Patient reports that on Tuesday the day after he began taking Lamictal he developed a diffuse red rash to his torso that has remained constant since that time.  Reports feeling warm and cold but denies measured fever at home.  Denies headache/vision changes, neck pain, difficulty swallowing, feeling of throat closing, burning of the mouth, chest pain/shortness of breath, dysuria/hematuria, fall/injury or any additional concerns.   On initial evaluation patient is overall well-appearing and in no acute distress, not actively vomiting.  He has mild abdominal tenderness generalized without peritoneal signs.  He has a diffuse mild erythematous rash of the anterior torso.  He has no airway or mucosal involvement.  Discussed case with Dr. Roslynn Amble, discussed possible etiologies such as squamoid poisoning, feel this is unlikely based on history and physical examination but will give Benadryl today.  He did begin taking Lamictal just prior to onset of his rash, review of notes from [facility] neurology show that they recommended that he stop taking Lamictal today but continue with his Keppra. - Patient seen and evaluated by Dr. Roslynn Amble advises that if no acute findings on CT abdomen pelvis will discharge with Benadryl and outpatient follow-up. - CBC with extensions of 11.4 Lipase within normal limits CMP with anion gap 16, suspect secondary to dehydration Urinalysis with ketones and elevated specific gravity, suspect secondary to dehydration Ethanol negative, patient does not appear to be in alcohol withdrawal  CT abdomen pelvis:  IMPRESSION: No acute CT finding to account for abdominal pain.  There is a small hypodense/hypoenhancing lesion within segment 7 of the liver which was not present on the comparison noncontrast CT. The enhancement characteristics are suspicious,  and while this could represent a vascular lesion, malignancy (either primary or metastatic) cannot be excluded. Nonemergent follow-up contrast-enhanced liver protocol MRI is recommended. These results were discussed by telephone at the time of interpretation on 08/11/2019 at 5:21 pm with Dr. Nuala Alpha.  Aortic Atherosclerosis (ICD10-I70.0). - Discussed case with in-house pharmacy who advises that patient take Benadryl 25 mg twice daily x7 days for his rash and discontinue lamotrigine, follow-up for recheck early next week. - Patient reevaluated resting comfortably and in no acute distress.  On reevaluation abdomen is soft without peritoneal signs.  Patient has been updated on results today and he states understanding of need for follow-up.  He will be given referral to gastroenterologist for further evaluation encouraged to call office tomorrow morning.  He will be discharged with Zofran for nausea and vomiting.  Patient advised to stop taking lamotrigine immediately and to call his neurologist tomorrow morning to advise them of this.  He has Keppra at home that he will continue taking for his symptoms.  Patient does not appear to be having Stevens-Johnson syndrome at this time, he is overall well-appearing and in no acute distress.  He is advised to take Benadryl 25 mg twice daily x7  days and to follow-up with neurologist and PCP on Monday.  Note the patient does need a follow-up MRI of the liver because of the liver lesion seen which could represent a malignancy or metastatic lesion.  Contrast-enhanced liver protocol MRI was recommended.  The patient now has been sober for 3 weeks.  Is not drinking alcohol currently.  This patient had been previously in mental health programs for drug substance use and depression.  He has been on multiple depression medications in the past.  He has been on trazodone for sleep in the past.  He also has been on Prozac.  He also used IV heroin in the past and  also cocaine and Dilaudid in the past.  He does obtain off the street Xanax and uses this as needed at home.   10/03/2019 This patient is seen today in return follow-up from the mid October visit as documented above.  The patient currently is not drinking alcohol at this time.  He still suffers from significant depression and anxiety.  We tried sertraline on him 50 mg daily but he experienced erectile dysfunction and had to stop this medication.  He is continue the thiamine [id number] mg daily folic acid 1 mg daily.  The patient is currently not on treatment for hypertension as his blood pressures been well controlled.  Note this patient underwent an MRI of the liver to look for any abnormalities in the liver when an abdominal CT scan suggested potential liver cancer.  The MRI in fact showed a benign hepatic hemangioma and nothing else.  I went over with the patient this result previously on the phone today we went over this visit the results as well face-to-face.  The patient states he still has significant anxiety and depression and has not been able to achieve a psychiatry appointment as of yet.  He did meet with the licensed Education officer, [employer]. The patient is still smoking a pack a day of cigarettes.  He has not attempted to use nicotine to replace cigarettes as of yet.  He is maintaining his inhalers for COPD. Patient does complain of significant ongoing insomnia though he is on the trazodone 200 mg a day he gets him to sleep however he awakens in the middle the night with some choking sensations. The patient has completely stopped drinking alcohol  Past Medical History:  Diagnosis Date   Alcohol withdrawal ([place]) 07/29/2013   Allergy    Anxiety    Benzodiazepine abuse ([place]) 08/30/2019   Bipolar disorder ([place])    Chronic insomnia 06/06/2015   Chronic migraine without aura, with intractable migraine, so stated, with status migrainosus 08/25/2019   Cocaine abuse ([place]) 05/22/2015   Delirium tremens  ([place]) 07/29/2013   Depression    Epilepsy ([place])    Hemiplegic migraine with status migrainosus 06/06/2015   Hypertension    Migraine    Opioid use disorder ([place]) 08/30/2019   Stroke ([place])    patient denies   Substance abuse ([place])      Family History  Problem Relation Age of Onset   Heart disease Mother    Stroke Mother    Epilepsy Mother    Heart disease Father    Hypertension Father    Melanoma Father    Migraines Father    Colon cancer Sister    Colon cancer Cousin      Social History   Socioeconomic History   Marital status: Divorced    Spouse name: Not on file   Number of children:  0   Years of education: GED   Highest education level: Not on file  Occupational History   Occupation: disabled  Social Designer, fashion/clothing strain: Not on file   Food insecurity    Worry: Not on file    Inability: Not on file   Transportation needs    Medical: Not on file    Non-medical: Not on file  Tobacco Use   Smoking status: Current Every Day Smoker    Packs/day: 1.00    Years: 15.00    Pack years: 15.00    Types: Cigarettes   Smokeless tobacco: Never Used  Substance and Sexual Activity   Alcohol use: Yes    Alcohol/week: 6.0 standard drinks    Types: 6 Cans of beer per week    Comment: pt reports no ETOH and no durgs in 3 moths.    Drug use: Yes    Frequency: 3.0 times per week    Types: Marijuana, Heroin, Cocaine    Comment: crack, heroin pt reports no ETOH and no durgs in 3 moths.    Sexual activity: Yes    Birth control/protection: Condom  Lifestyle   Physical activity    Days per week: Not on file    Minutes per session: Not on file   Stress: Not on file  Relationships   Social connections    Talks on phone: Not on file    Gets together: Not on file    Attends religious service: Not on file    Active member of club or organization: Not on file    Attends meetings of clubs or organizations: Not on file     Relationship status: Not on file   Intimate partner violence    Fear of current or ex partner: Not on file    Emotionally abused: Not on file    Physically abused: Not on file    Forced sexual activity: Not on file  Other Topics Concern   Not on file  Social History Narrative   Pt lives with sister.   Patient drinks 2 cups of caffeine daily.   Patient is right handed.     Allergies  Allergen Reactions   Sertraline Other (See Comments)    Erectile dysfunction   Codeine Nausea And Vomiting   Depakote [Divalproex Sodium] Other (See Comments)    Made patient shake   Migranal [Dihydroergotamine] Nausea And Vomiting   Topamax [Topiramate] Other (See Comments)    Made patient angry     Outpatient Medications Prior to Visit  Medication Sig Dispense Refill   albuterol (VENTOLIN HFA) 108 (90 Base) MCG/ACT inhaler Inhale 2 puffs into the lungs every 4 (four) hours as needed for shortness of breath or wheezing. 18 g 1   budesonide-formoterol (SYMBICORT) 160-4.5 MCG/ACT inhaler Inhale 2 puffs into the lungs 2 (two) times daily. 1 Inhaler 12   diphenhydrAMINE (BENADRYL) 25 MG tablet Take 1 tablet (25 mg total) by mouth 2 (two) times daily. 20 tablet 0   folic acid (FOLVITE) 1 MG tablet Take 1 tablet (1 mg total) by mouth daily. 30 tablet 3   levETIRAcetam (KEPPRA) 750 MG tablet Take 1 tablet (750 mg total) by mouth 2 (two) times daily. 180 tablet 1   ondansetron (ZOFRAN ODT) 4 MG disintegrating tablet Take 1 tablet (4 mg total) by mouth every 8 (eight) hours as needed for nausea or vomiting. 12 tablet 0   thiamine 100 MG tablet Take 1 tablet (100 mg total) by mouth  daily. 30 tablet 3   traZODone (DESYREL) 100 MG tablet Take 2 tablets (200 mg total) by mouth at bedtime. 60 tablet 2   nicotine polacrilex (NICORETTE MINI) 4 MG lozenge Use one 4 times daily to stop smoking (Patient not taking: Reported on 10/03/2019) 100 tablet 4   No facility-administered medications prior to  visit.      Review of Systems Constitutional:    weight loss, night sweats,  Fevers, chills, fatigue, lassitude. HEENT:   No headaches,  Difficulty swallowing,  Tooth/dental problems,  Sore throat,                No sneezing, itching, ear ache, nasal congestion, post nasal drip,   CV:  No chest pain,  Orthopnea, PND, swelling in lower extremities, anasarca, dizziness, palpitations  GI   heartburn, indigestion, abdominal pain, nausea, vomiting, diarrhea, change in bowel habits, loss of appetite  Resp:  shortness of breath with exertion or at rest.  No excess mucus, no productive cough,  No non-productive cough,  No coughing up of blood.  No change in color of mucus.  No wheezing.  No chest wall deformity  Skin: no rash or lesions.  GU: no dysuria, change in color of urine, no urgency or frequency.  No flank pain.  MS:  No joint pain or swelling.  No decreased range of motion.  No back pain.  Psych:   change in mood or affect.  depression or anxiety.  No memory loss.     Objective:   Physical Exam Vitals:   10/03/19 1056  BP: 127/78  Pulse: (!) 55  Resp: 18  Temp: 98 F (36.7 C)  TempSrc: Oral  SpO2: 100%  Weight: 160 lb (72.6 kg)  Height: 6' (1.829 m)    Gen: Pleasant, well-nourished, in no distress, depressed l affect  ENT: No lesions,  mouth clear,  oropharynx clear, no postnasal drip  Neck: No JVD, no TMG, no carotid bruits  Lungs: No use of accessory muscles, no dullness to percussion, distant breath sounds  Cardiovascular: RRR, heart sounds normal, no murmur or gallops, no peripheral edema  Abdomen: soft and NT, no HSM,  BS normal  Musculoskeletal: No deformities, no cyanosis or clubbing  Neuro: alert, non focal  Skin: Warm, no lesions or rashes  08/11/19 CT abdomen pelvis:   IMPRESSION: No acute CT finding to account for abdominal pain.  Hepatobiliary: Rounded hypodense lesion in segment 7, image 12 series 3. Lesion measures 16 mm in greatest  diameter, and was not present or visualized on the prior noncontrast CT. There is differential enhancement pattern with less distinct border on the delayed CT images.  There is a small hypodense/hypoenhancing lesion within segment 7 of the liver which was not present on the comparison noncontrast CT. The enhancement characteristics are suspicious, and while this could represent a vascular lesion, malignancy (either primary or metastatic) cannot be excluded. Nonemergent follow-up contrast-enhanced liver protocol MRI is recommended. These results were discussed by telephone at the time of interpretation on  MRI 09/20/19:  IMPRESSION: 1. 16 mm segment 7 liver lesion has MR imaging characteristics of a benign hepatic hemangioma. 2. No other liver lesions are identified. No intra or extrahepatic biliary dilatation. 3. No acute abdominal findings and no adenopathy.    Assessment & Plan:  I personally reviewed all images and lab data in the [facility] system as well as any outside material available during this office visit and agree with the  radiology impressions.   Liver  hemangioma Number third MRI of the abdomen showed a benign hemangioma of the liver.  I went over the results of the MRI with the patient and showed the patient images at this visit no further work-up is needed  COPD with chronic bronchitis ([place]) COPD with chronic bronchitis stable at this time  Need to continue to pursue smoking cessation and continue inhalers  Alcohol dependence with uncomplicated withdrawal ([place]) History of alcohol dependence with uncomplicated withdrawal at this point the patient's not drinking alcohol and will have further follow-up with psychiatry and have made another appointment with psychiatry for this  MDD (major depressive disorder), recurrent severe, without psychosis ([place]) History of major depression with recurrent symptoms  The patient failed sertraline because of erectile dysfunction side  effects  Plan to be for the patient to be referred to psychiatry for further evaluation  Tobacco use Ongoing tobacco use      Current smoking consumption amount: 1PPD  Dicsussion on advise to quit smoking and smoking impacts: Went over with the patient again the need to quit smoking and the impact of the tobacco use on the patient's lungs  Patient's willingness to quit: The patient appears to be willing to make a stab at quitting smoking once he has his depression anxiety under better control   Methods to quit smoking discussed: We discussed nicotine replacement therapy is the best option using nicotine lozenges   Medication management of smoking session drugs discussed: Recommending 4 mg 3-4 times daily of nicotine lozenges   Resources provided:  AVS    Setting quit date quit date being set into January   Follow-up arranged patient will follow up with me   Time spent counseling the patient: 10 minutes    Albaraa was seen today for follow-up.  Diagnoses and all orders for this visit:  MDD (major depressive disorder), recurrent severe, without psychosis ([place]) -     Ambulatory referral to Psychiatry  Substance induced mood disorder ([place]) -     Ambulatory referral to Psychiatry  Tobacco use -     Ambulatory referral to Psychiatry  Alcohol dependence with uncomplicated withdrawal ([place]) -     Ambulatory referral to Psychiatry  Chronic insomnia  Sleep apnea, unspecified type -     Split night study; Future  History of stroke  Essential hypertension  Seizures ([facility])  COPD with chronic bronchitis ([facility])  Liver hemangioma

## 2019-10-03 ENCOUNTER — Other Ambulatory Visit: Payer: Self-pay

## 2019-10-03 ENCOUNTER — Encounter: Payer: Self-pay | Admitting: Critical Care Medicine

## 2019-10-03 ENCOUNTER — Ambulatory Visit: Payer: Medicare HMO | Attending: Critical Care Medicine | Admitting: Critical Care Medicine

## 2019-10-03 VITALS — BP 127/78 | HR 55 | Temp 98.0°F | Resp 18 | Ht 72.0 in | Wt 160.0 lb

## 2019-10-03 DIAGNOSIS — F1994 Other psychoactive substance use, unspecified with psychoactive substance-induced mood disorder: Secondary | ICD-10-CM

## 2019-10-03 DIAGNOSIS — F332 Major depressive disorder, recurrent severe without psychotic features: Secondary | ICD-10-CM | POA: Diagnosis not present

## 2019-10-03 DIAGNOSIS — J4489 Other specified chronic obstructive pulmonary disease: Secondary | ICD-10-CM

## 2019-10-03 DIAGNOSIS — F5104 Psychophysiologic insomnia: Secondary | ICD-10-CM | POA: Diagnosis not present

## 2019-10-03 DIAGNOSIS — Z720 Tobacco use: Secondary | ICD-10-CM

## 2019-10-03 DIAGNOSIS — R569 Unspecified convulsions: Secondary | ICD-10-CM

## 2019-10-03 DIAGNOSIS — D1803 Hemangioma of intra-abdominal structures: Secondary | ICD-10-CM | POA: Diagnosis not present

## 2019-10-03 DIAGNOSIS — J449 Chronic obstructive pulmonary disease, unspecified: Secondary | ICD-10-CM

## 2019-10-03 DIAGNOSIS — I10 Essential (primary) hypertension: Secondary | ICD-10-CM | POA: Diagnosis not present

## 2019-10-03 DIAGNOSIS — Z8673 Personal history of transient ischemic attack (TIA), and cerebral infarction without residual deficits: Secondary | ICD-10-CM

## 2019-10-03 DIAGNOSIS — F10230 Alcohol dependence with withdrawal, uncomplicated: Secondary | ICD-10-CM

## 2019-10-03 DIAGNOSIS — G4730 Sleep apnea, unspecified: Secondary | ICD-10-CM | POA: Diagnosis not present

## 2019-10-03 DIAGNOSIS — F17210 Nicotine dependence, cigarettes, uncomplicated: Secondary | ICD-10-CM | POA: Diagnosis not present

## 2019-10-03 NOTE — Patient Instructions (Signed)
A referral to the neuropsychiatric care center will be made for your depression  A sleep study will be ordered  Stay off sertraline for now until you are seen by the physician at neuropsychiatric care center and will make further recommendations  Focus on smoking cessation as you are able to with nicotine replacement therapy  No other changes in medications for now  You deferred the tetanus vaccine  Return to see Dr. Joya Gaskins in 3 months

## 2019-10-03 NOTE — Assessment & Plan Note (Signed)
COPD with chronic bronchitis stable at this time  Need to continue to pursue smoking cessation and continue inhalers

## 2019-10-03 NOTE — Assessment & Plan Note (Signed)
History of alcohol dependence with uncomplicated withdrawal at this point the patient's not drinking alcohol and will have further follow-up with psychiatry and have made another appointment with psychiatry for this

## 2019-10-03 NOTE — Assessment & Plan Note (Signed)
Number third MRI of the abdomen showed a benign hemangioma of the liver.  I went over the results of the MRI with the patient and showed the patient images at this visit no further work-up is needed

## 2019-10-03 NOTE — Assessment & Plan Note (Signed)
History of major depression with recurrent symptoms  The patient failed sertraline because of erectile dysfunction side effects  Plan to be for the patient to be referred to psychiatry for further evaluation

## 2019-10-03 NOTE — Assessment & Plan Note (Signed)
Ongoing tobacco use     . Current smoking consumption amount: 1PPD  Dicsussion on advise to quit smoking and smoking impacts: Went over with the patient again the need to quit smoking and the impact of the tobacco use on the patient's lungs . Patient's willingness to quit: The patient appears to be willing to make a stab at quitting smoking once he has his depression anxiety under better control  . Methods to quit smoking discussed: We discussed nicotine replacement therapy is the best option using nicotine lozenges  . Medication management of smoking session drugs discussed: Recommending 4 mg 3-4 times daily of nicotine lozenges  . Resources provided:  AVS   . Setting quit date quit date being set into January  . Follow-up arranged patient will follow up with me   Time spent counseling the patient: 10 minutes

## 2019-10-09 DIAGNOSIS — Z20828 Contact with and (suspected) exposure to other viral communicable diseases: Secondary | ICD-10-CM | POA: Diagnosis not present

## 2019-10-11 ENCOUNTER — Ambulatory Visit (INDEPENDENT_AMBULATORY_CARE_PROVIDER_SITE_OTHER): Payer: Medicare HMO | Admitting: Neurology

## 2019-10-11 ENCOUNTER — Encounter: Payer: Self-pay | Admitting: Neurology

## 2019-10-11 VITALS — BP 121/80 | HR 65 | Temp 97.9°F | Wt 164.0 lb

## 2019-10-11 DIAGNOSIS — G43711 Chronic migraine without aura, intractable, with status migrainosus: Secondary | ICD-10-CM

## 2019-10-11 MED ORDER — ONABOTULINUMTOXINA 100 UNITS IJ SOLR
200.0000 [IU] | Freq: Once | INTRAMUSCULAR | Status: AC
  Administered 2019-10-11: 200 [IU] via INTRAMUSCULAR

## 2019-10-11 MED ORDER — DEXAMETHASONE 2 MG PO TABS: ORAL_TABLET | ORAL | 1 refills | Status: DC

## 2019-10-11 NOTE — Procedures (Signed)
     BOTOX PROCEDURE NOTE FOR MIGRAINE HEADACHE   HISTORY: Spencer Mendoza is a 58 year old gentleman with a history of intractable migraine headaches, he recently just got over a 4-day headache with severe nausea and vomiting.  The patient has failed multiple drugs for his headache.  He comes in for his first Botox injection.  He has signed a consent form.   Description of procedure:  The patient was placed in a sitting position. The standard protocol was used for Botox as follows, with 5 units of Botox injected at each site:   -Procerus muscle, midline injection  -Corrugator muscle, bilateral injection  -Frontalis muscle, bilateral injection, with 2 sites each side, medial injection was performed in the upper one third of the frontalis muscle, in the region vertical from the medial inferior edge of the superior orbital rim. The lateral injection was again in the upper one third of the forehead vertically above the lateral limbus of the cornea, 1.5 cm lateral to the medial injection site.  -Temporalis muscle injection, 4 sites, bilaterally. The first injection was 3 cm above the tragus of the ear, second injection site was 1.5 cm to 3 cm up from the first injection site in line with the tragus of the ear. The third injection site was 1.5-3 cm forward between the first 2 injection sites. The fourth injection site was 1.5 cm posterior to the second injection site.  -Occipitalis muscle injection, 3 sites, bilaterally. The first injection was done one half way between the occipital protuberance and the tip of the mastoid process behind the ear. The second injection site was done lateral and superior to the first, 1 fingerbreadth from the first injection. The third injection site was 1 fingerbreadth superiorly and medially from the first injection site.  -Cervical paraspinal muscle injection, 2 sites, bilateral, the first injection site was 1 cm from the midline of the cervical spine, 3 cm  inferior to the lower border of the occipital protuberance. The second injection site was 1.5 cm superiorly and laterally to the first injection site.  -Trapezius muscle injection was performed at 3 sites, bilaterally. The first injection site was in the upper trapezius muscle halfway between the inflection point of the neck, and the acromion. The second injection site was one half way between the acromion and the first injection site. The third injection was done between the first injection site and the inflection point of the neck.   A 200 unit bottle of Botox was used, 155 units were injected, the rest of the Botox was wasted. The patient tolerated the procedure well, there were no complications of the above procedure.  Botox NDC [id number] Lot number [id number] Expiration date August 2023

## 2019-10-11 NOTE — Progress Notes (Signed)
Buy and Bill  Botox 100 units vial x2 Lot number [id number] Expires 06/2022 [place] number [phone]

## 2019-10-11 NOTE — Progress Notes (Signed)
The patient comes in today for Botox therapy for migraine headaches that are intractable.  Patient may have headaches that last up to 4 days of time, he will be given a prescription for Decadron to take if needed for the severe headaches.

## 2019-10-25 ENCOUNTER — Telehealth: Payer: Self-pay | Admitting: Licensed Clinical Social Worker

## 2019-10-25 NOTE — Telephone Encounter (Signed)
Call placed to pt to inform him that previous psychiatry referral did not accept his insurance. Pt provided LCSW with verbal consent to refer him to [facility] for psychiatry.   A completed referral was faxed to [phone]. No additional concerns noted.

## 2019-10-26 ENCOUNTER — Telehealth: Payer: Self-pay

## 2019-10-26 NOTE — Telephone Encounter (Signed)
Called pt to inform him of his next BOTOX appt.Pt verbalized appreciation.

## 2019-10-26 NOTE — Telephone Encounter (Signed)
Please make patient aware of apt date and time and confirm it works for him. DW

## 2019-10-28 ENCOUNTER — Telehealth: Payer: Self-pay | Admitting: Critical Care Medicine

## 2019-10-28 NOTE — Telephone Encounter (Signed)
Coralyn Mark form the sleep center called saying they received an order for the patient to have a night sleep study but patient has [employer] and needs to have a prior authorization. Please f/u with Coralyn Mark once prior Josem Kaufmann has been done.

## 2019-10-28 NOTE — Telephone Encounter (Signed)
May you please work on prior auth please

## 2019-10-31 ENCOUNTER — Telehealth: Payer: Self-pay | Admitting: *Deleted

## 2019-10-31 NOTE — Telephone Encounter (Signed)
Called patient and [facility] for patient to call back to give more information about getting approved for his sleep study test.   Spoke with Luciana Axe with [facility] and was informed that patient Sleep study test is needing more information and will be faxing office clinical review formed to be completed and attach clinical information and faxed back to them.   Informed Terry with the sleep study facility that Prior Auth is still being worked on and she verbalized understanding and would like office to call back with Auth number when completed. Coralyn Mark number is [phone].   [employer] Case number is [id number]  Fax number is [phone] Phone number is [phone] or [phone]

## 2019-11-02 NOTE — Telephone Encounter (Signed)
Called patient and LMOM

## 2019-11-03 ENCOUNTER — Telehealth: Payer: Self-pay | Admitting: Licensed Clinical Social Worker

## 2019-11-03 NOTE — Telephone Encounter (Signed)
LCSW placed call to [facility] to follow up on psychiatry referral on 10/25/19.   LCSW spoke with Jonelle Sidle, Referral Coordinator [phone], who shared that pt missed a new patient appointment in October 2020 due to not having ability to participate in a video appointment. Ms. Jonelle Sidle is willing to schedule a new pt appointment if he has acquired the needed software for video appointments.  11:49 AM  LCSW placed call to patient and left a message requesting a return call. LCSW will make additional attempts to contact patient.

## 2019-11-08 NOTE — Telephone Encounter (Signed)
Spoke with patient and informed him that insurance  wanted more information and provider completed and RMA faxed it back to insurance and office should hear back from insurance company soon to see if the sleep study was approved or not. Patient verbalized understanding.

## 2019-11-09 ENCOUNTER — Other Ambulatory Visit: Payer: Self-pay

## 2019-11-09 ENCOUNTER — Encounter: Payer: Self-pay | Admitting: Critical Care Medicine

## 2019-11-09 ENCOUNTER — Ambulatory Visit: Payer: Medicare HMO | Attending: Critical Care Medicine | Admitting: Critical Care Medicine

## 2019-11-09 DIAGNOSIS — R0683 Snoring: Secondary | ICD-10-CM

## 2019-11-09 DIAGNOSIS — G43711 Chronic migraine without aura, intractable, with status migrainosus: Secondary | ICD-10-CM | POA: Diagnosis not present

## 2019-11-09 DIAGNOSIS — I10 Essential (primary) hypertension: Secondary | ICD-10-CM | POA: Diagnosis not present

## 2019-11-09 DIAGNOSIS — F10230 Alcohol dependence with withdrawal, uncomplicated: Secondary | ICD-10-CM | POA: Diagnosis not present

## 2019-11-09 DIAGNOSIS — Z720 Tobacco use: Secondary | ICD-10-CM | POA: Diagnosis not present

## 2019-11-09 DIAGNOSIS — F332 Major depressive disorder, recurrent severe without psychotic features: Secondary | ICD-10-CM | POA: Diagnosis not present

## 2019-11-09 DIAGNOSIS — F5104 Psychophysiologic insomnia: Secondary | ICD-10-CM | POA: Diagnosis not present

## 2019-11-09 DIAGNOSIS — G4719 Other hypersomnia: Secondary | ICD-10-CM

## 2019-11-09 DIAGNOSIS — J449 Chronic obstructive pulmonary disease, unspecified: Secondary | ICD-10-CM

## 2019-11-09 MED ORDER — FLUTICASONE PROPIONATE 50 MCG/ACT NA SUSP: 2.0000 | Freq: Every day | NASAL | 6 refills | Status: DC

## 2019-11-09 MED ORDER — PREDNISONE 10 MG PO TABS: ORAL_TABLET | ORAL | 0 refills | Status: DC

## 2019-11-09 MED ORDER — AMOXICILLIN-POT CLAVULANATE 875-125 MG PO TABS: 1.0000 | ORAL_TABLET | Freq: Two times a day (BID) | ORAL | 0 refills | Status: DC

## 2019-11-09 MED ORDER — DM-GUAIFENESIN ER 30-600 MG PO TB12: 1.0000 | ORAL_TABLET | Freq: Two times a day (BID) | ORAL | 1 refills | Status: DC

## 2019-11-09 NOTE — Assessment & Plan Note (Signed)
Chronic migraines markedly improved with recent Botox injections

## 2019-11-09 NOTE — Assessment & Plan Note (Signed)
Hypertension appears to be under good control without the use of any medications

## 2019-11-09 NOTE — Assessment & Plan Note (Addendum)
COPD with acute bronchitis for this we will prescribe Augmentin twice daily for 10 days and pulse prednisone Along with Flonase  There is likely a sinusitis component  The patient had Covid in November and I am concerned there is a post viral element to this patient's cough

## 2019-11-09 NOTE — Assessment & Plan Note (Signed)
Major depressive disorder  Will refer again to psychiatry and attempt to get this approved

## 2019-11-09 NOTE — Assessment & Plan Note (Signed)
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

## 2019-11-09 NOTE — Progress Notes (Signed)
Subjective:    Patient ID: Spencer Mendoza, male    DOB: 1961/02/14, 58 y.o.   MRN: [id number] Virtual Visit via Telephone Note  I connected with Spencer Mendoza on 11/09/19 at  9:30 AM EST by telephone and verified that I am speaking with the correct person using two identifiers.   Consent:  I discussed the limitations, risks, security and privacy concerns of performing an evaluation and management service by telephone and the availability of in person appointments. I also discussed with the patient that there may be a patient responsible charge related to this service. The patient expressed understanding and agreed to proceed.  Location of patient: Patient was at home  Location of provider: I was in the office  Persons participating in the televisit with the patient.   The patient's spouse was on the call as well     History of Present Illness:    This is a 58 year old white male history of episodic seizures associated with tonic-clonic events and urinary incontinence.  History also of heavy ethanol use and severe generalized anxiety disorder with claustrophobia.  Patient has significant insomnia as well.  The patient has also chronic history of migraines been on multiple medications for this in the past as well.  Patient has a history of substance use including opioids in the past as well.  Patient also uses marijuana at this time.  Patient has history of bipolar disorder and severe depression.  Patient presents today post emergency room visit to establish for primary care.  The patient had been in this clinic previously 5 years ago and wishes to come back to the clinic now to establish.  There is also prior history of stroke.  Also history of nausea vomiting and diarrhea in the past.  The patient was seen in the emergency room on 24 September for similar complaints.  Below is the office visit with neurology on October 8  08/25/19 OV with Neurology [name] is a 58 year old  right-handed white male with a history of episodic seizures associated with generalized tonic-clonic events and occasional urinary incontinence.  The patient has had significant issues with alcohol abuse, he may drink up to 12 beers a day.  He has been in the hospital in June, and on 17 September with seizures, he was in the hospital on 11 August 2019 with gastroenteritis, a CT scan of the abdomen at that time showed a low-density area in the liver that was new, he will be evaluated for possible liver cancer in the near future.  The patient has a history of bipolar disorder, he is not followed through psychiatry.  He reports a lot of problems with claustrophobia and GAD.  He is not sleeping well, he indicates that severe insomnia may worsen his seizure control.  He is on trazodone 100 mg at night but this is not helpful.  The patient continues to have headaches almost daily or every other day, he may have a severe headache at least once a week.  He has been on a multitude of medications in the past for migraine including propranolol, amitriptyline, Topamax, Depakote, Effexor, gabapentin, Seroquel, Prozac, and Aimovig.  None of these modalities have been effective in controlling his headache.  The patient returns for an evaluation.   1.  History of seizures  2.  Alcohol abuse, marijuana abuse  3.  Chronic insomnia  4.  Chronic intractable migraine  5.  Anxiety, bipolar disorder  The patient will be increased on the trazodone  to 150 mg at night, he will call for any dose adjustments.  He will be set up for Botox therapy.  The patient will go up on the Keppra dosing to 750 mg twice daily.  He will follow-up here in 6 months, likely be seen sooner for Botox therapy.  Below is the emergency room visit September 24 In ED 9/24: Spencer Mendoza is a 58 y.o. male with history of bipolar, epilepsy, hypertension, migraine, CVA, substance abuse, EtOH abuse presents today for abdominal pain nausea  vomiting diarrhea and rash.  Patient reports that on Sunday evening he ate shrimp that he found in his refrigerator.  He reports that he was unaware of it at that time but shrimp had been in the refrigerator for greater than 12 days.  He reports that on Sunday night he developed abdominal pain and diarrhea.  He describes a generalized abdominal pain a severe cramping sensation constant without aggravating or alleviating symptoms or radiation.  Abdominal pain has been constant for the past 4 days.  Patient reports that on Monday morning he began taking a new medication prescribed by his neurologist, Lamictal for seizures in addition to his normal Keppra.  He reports that shortly after taking the Lamictal on Monday morning his diarrhea improved but was followed by nausea and vomiting.  He describes multiple episodes of nonbloody/nonbilious emesis over the past 3 days with continued abdominal pain.  Patient reports that on Tuesday the day after he began taking Lamictal he developed a diffuse red rash to his torso that has remained constant since that time.  Reports feeling warm and cold but denies measured fever at home.  Denies headache/vision changes, neck pain, difficulty swallowing, feeling of throat closing, burning of the mouth, chest pain/shortness of breath, dysuria/hematuria, fall/injury or any additional concerns.   On initial evaluation patient is overall well-appearing and in no acute distress, not actively vomiting.  He has mild abdominal tenderness generalized without peritoneal signs.  He has a diffuse mild erythematous rash of the anterior torso.  He has no airway or mucosal involvement.  Discussed case with Dr. Roslynn Amble, discussed possible etiologies such as squamoid poisoning, feel this is unlikely based on history and physical examination but will give Benadryl today.  He did begin taking Lamictal just prior to onset of his rash, review of notes from [facility] neurology show that they  recommended that he stop taking Lamictal today but continue with his Keppra. - Patient seen and evaluated by Dr. Roslynn Amble advises that if no acute findings on CT abdomen pelvis will discharge with Benadryl and outpatient follow-up. - CBC with extensions of 11.4 Lipase within normal limits CMP with anion gap 16, suspect secondary to dehydration Urinalysis with ketones and elevated specific gravity, suspect secondary to dehydration Ethanol negative, patient does not appear to be in alcohol withdrawal  CT abdomen pelvis:  IMPRESSION: No acute CT finding to account for abdominal pain.  There is a small hypodense/hypoenhancing lesion within segment 7 of the liver which was not present on the comparison noncontrast CT. The enhancement characteristics are suspicious, and while this could represent a vascular lesion, malignancy (either primary or metastatic) cannot be excluded. Nonemergent follow-up contrast-enhanced liver protocol MRI is recommended. These results were discussed by telephone at the time of interpretation on 08/11/2019 at 5:21 pm with Dr. Nuala Alpha.  Aortic Atherosclerosis (ICD10-I70.0). - Discussed case with in-house pharmacy who advises that patient take Benadryl 25 mg twice daily x7 days for his rash and discontinue lamotrigine, follow-up  for recheck early next week. - Patient reevaluated resting comfortably and in no acute distress.  On reevaluation abdomen is soft without peritoneal signs.  Patient has been updated on results today and he states understanding of need for follow-up.  He will be given referral to gastroenterologist for further evaluation encouraged to call office tomorrow morning.  He will be discharged with Zofran for nausea and vomiting.  Patient advised to stop taking lamotrigine immediately and to call his neurologist tomorrow morning to advise them of this.  He has Keppra at home that he will continue taking for his symptoms.  Patient does not  appear to be having Stevens-Johnson syndrome at this time, he is overall well-appearing and in no acute distress.  He is advised to take Benadryl 25 mg twice daily x7 days and to follow-up with neurologist and PCP on Monday.  Note the patient does need a follow-up MRI of the liver because of the liver lesion seen which could represent a malignancy or metastatic lesion.  Contrast-enhanced liver protocol MRI was recommended.  The patient now has been sober for 3 weeks.  Is not drinking alcohol currently.  This patient had been previously in mental health programs for drug substance use and depression.  He has been on multiple depression medications in the past.  He has been on trazodone for sleep in the past.  He also has been on Prozac.  He also used IV heroin in the past and also cocaine and Dilaudid in the past.  He does obtain off the street Xanax and uses this as needed at home.   10/03/2019 This patient is seen today in return follow-up from the mid October visit as documented above.  The patient currently is not drinking alcohol at this time.  He still suffers from significant depression and anxiety.  We tried sertraline on him 50 mg daily but he experienced erectile dysfunction and had to stop this medication.  He is continue the thiamine [id number] mg daily folic acid 1 mg daily.  The patient is currently not on treatment for hypertension as his blood pressures been well controlled.  Note this patient underwent an MRI of the liver to look for any abnormalities in the liver when an abdominal CT scan suggested potential liver cancer.  The MRI in fact showed a benign hepatic hemangioma and nothing else.  I went over with the patient this result previously on the phone today we went over this visit the results as well face-to-face.  The patient states he still has significant anxiety and depression and has not been able to achieve a psychiatry appointment as of yet.  He did meet with the licensed Research officer, political party. The patient is still smoking a pack a day of cigarettes.  He has not attempted to use nicotine to replace cigarettes as of yet.  He is maintaining his inhalers for COPD. Patient does complain of significant ongoing insomnia though he is on the trazodone 200 mg a day he gets him to sleep however he awakens in the middle the night with some choking sensations. The patient has completely stopped drinking alcohol  11/09/2019 This is a follow-up telephone visit for this patient who was last seen in November.  The patient has recently received Botox injections for his migraine syndrome and this is markedly improved his symptoms.  He maintains his seizure medications as well per neurology.  He is still smoking about half a pack a day of cigarettes.  He complains of increased cough  increased sinus pressure increased sneezing low-grade fever dizziness.  He was Covid + November 22 and at that time had more gastrointestinal symptoms.  He had a girlfriend was positive as well. He has yet to achieve a psychiatry visit for his major depressive disorder.  His insurance did not approve the referrals we made previously.  Also the patient continues with his insomnia and hypersomnolence during the day and is yet to achieve a sleep study due to barriers from [employer] as well.  He notes thick yellow mucus coming out of the nose at this time. The patient is using the Symbicort inhaler as prescribed  Past Medical History:  Diagnosis Date  . Alcohol withdrawal ([place]) 07/29/2013  . Allergy   . Anxiety   . Benzodiazepine abuse ([place]) 08/30/2019  . Bipolar disorder ([place])   . Chronic insomnia 06/06/2015  . Chronic migraine without aura, with intractable migraine, so stated, with status migrainosus 08/25/2019  . Cocaine abuse ([place]) 05/22/2015  . Delirium tremens ([place]) 07/29/2013  . Depression   . Epilepsy ([place])   . Hemiplegic migraine with status migrainosus 06/06/2015  . Hypertension   . Migraine   .  Opioid use disorder ([place]) 08/30/2019  . Stroke [facility])    patient denies  . Substance abuse ([place])      Family History  Problem Relation Age of Onset  . Heart disease Mother   . Stroke Mother   . Epilepsy Mother   . Heart disease Father   . Hypertension Father   . Melanoma Father   . Migraines Father   . Colon cancer Sister   . Colon cancer Cousin      Social History   Socioeconomic History  . Marital status: Divorced    Spouse name: Not on file  . Number of children: 0  . Years of education: GED  . Highest education level: Not on file  Occupational History  . Occupation: disabled  Tobacco Use  . Smoking status: Current Every Day Smoker    Packs/day: 1.00    Years: 15.00    Pack years: 15.00    Types: Cigarettes  . Smokeless tobacco: Never Used  Substance and Sexual Activity  . Alcohol use: Yes    Alcohol/week: 6.0 standard drinks    Types: 6 Cans of beer per week    Comment: pt reports no ETOH and no durgs in 3 moths.   . Drug use: Yes    Frequency: 3.0 times per week    Types: Marijuana, Heroin, Cocaine    Comment: crack, heroin pt reports no ETOH and no durgs in 3 moths.   . Sexual activity: Yes    Birth control/protection: Condom  Other Topics Concern  . Not on file  Social History Narrative   Pt lives with sister.   Patient drinks 2 cups of caffeine daily.   Patient is right handed.   Social Determinants of Health   Financial Resource Strain:   . Difficulty of Paying Living Expenses: Not on file  Food Insecurity:   . Worried About Charity fundraiser in the Last Year: Not on file  . Ran Out of Food in the Last Year: Not on file  Transportation Needs:   . Lack of Transportation (Medical): Not on file  . Lack of Transportation (Non-Medical): Not on file  Physical Activity:   . Days of Exercise per Week: Not on file  . Minutes of Exercise per Session: Not on file  Stress:   .  Feeling of Stress : Not on file  Social Connections:   . Frequency of  Communication with Friends and Family: Not on file  . Frequency of Social Gatherings with Friends and Family: Not on file  . Attends Religious Services: Not on file  . Active Member of Clubs or Organizations: Not on file  . Attends Archivist Meetings: Not on file  . Marital Status: Not on file  Intimate Partner Violence:   . Fear of Current or Ex-Partner: Not on file  . Emotionally Abused: Not on file  . Physically Abused: Not on file  . Sexually Abused: Not on file     Allergies  Allergen Reactions  . Sertraline Other (See Comments)    Erectile dysfunction  . Codeine Nausea And Vomiting  . Depakote [Divalproex Sodium] Other (See Comments)    Made patient shake  . Migranal [Dihydroergotamine] Nausea And Vomiting  . Topamax [Topiramate] Other (See Comments)    Made patient angry     Outpatient Medications Prior to Visit  Medication Sig Dispense Refill  . albuterol (VENTOLIN HFA) 108 (90 Base) MCG/ACT inhaler Inhale 2 puffs into the lungs every 4 (four) hours as needed for shortness of breath or wheezing. 18 g 1  . budesonide-formoterol (SYMBICORT) 160-4.5 MCG/ACT inhaler Inhale 2 puffs into the lungs 2 (two) times daily. 1 Inhaler 12  . dexamethasone (DECADRON) 2 MG tablet Take 3 tablets the first day, 2 the second and 1 the third day 6 tablet 1  . diphenhydrAMINE (BENADRYL) 25 MG tablet Take 1 tablet (25 mg total) by mouth 2 (two) times daily. 20 tablet 0  . folic acid (FOLVITE) 1 MG tablet Take 1 tablet (1 mg total) by mouth daily. 30 tablet 3  . levETIRAcetam (KEPPRA) 750 MG tablet Take 1 tablet (750 mg total) by mouth 2 (two) times daily. 180 tablet 1  . nicotine polacrilex (NICORETTE MINI) 4 MG lozenge Use one 4 times daily to stop smoking 100 tablet 4  . ondansetron (ZOFRAN ODT) 4 MG disintegrating tablet Take 1 tablet (4 mg total) by mouth every 8 (eight) hours as needed for nausea or vomiting. 12 tablet 0  . thiamine 100 MG tablet Take 1 tablet (100 mg total) by  mouth daily. 30 tablet 3  . traZODone (DESYREL) 100 MG tablet Take 2 tablets (200 mg total) by mouth at bedtime. 60 tablet 2  . ALPRAZolam (XANAX) 1 MG tablet Take 2 mg by mouth once.     No facility-administered medications prior to visit.     Review of Systems Constitutional:    weight loss, night sweats,  Fevers, chills, fatigue, lassitude. HEENT:   No headaches,  Difficulty swallowing,  Tooth/dental problems,  Sore throat,               o sneezing, itching, ear ache, nasal congestion, post nasal drip,   CV:  No chest pain,  Orthopnea, PND, swelling in lower extremities, anasarca, dizziness, palpitations  GI   heartburn, indigestion, abdominal pain, nausea, vomiting, diarrhea, change in bowel habits, loss of appetite  Resp:  shortness of breath with exertion or at rest.  No excess mucus, nproductive cough,  No non-productive cough,  No coughing up of blood.  No change in color of mucus.  No wheezing.  No chest wall deformity  Skin: no rash or lesions.  GU: no dysuria, change in color of urine, no urgency or frequency.  No flank pain.  MS:  No joint pain or swelling.  No decreased range of motion.  No back pain.  Psych:   change in mood or affect.  depression or anxiety.  No memory loss.     Objective:   Physical Exam There were no vitals filed for this visit. No exam this is a phone visit  08/11/19 CT abdomen pelvis:   IMPRESSION: No acute CT finding to account for abdominal pain.  Hepatobiliary: Rounded hypodense lesion in segment 7, image 12 series 3. Lesion measures 16 mm in greatest diameter, and was not present or visualized on the prior noncontrast CT. There is differential enhancement pattern with less distinct border on the delayed CT images.  There is a small hypodense/hypoenhancing lesion within segment 7 of the liver which was not present on the comparison noncontrast CT. The enhancement characteristics are suspicious, and while this could represent a  vascular lesion, malignancy (either primary or metastatic) cannot be excluded. Nonemergent follow-up contrast-enhanced liver protocol MRI is recommended. These results were discussed by telephone at the time of interpretation on  MRI 09/20/19:  IMPRESSION: 1. 16 mm segment 7 liver lesion has MR imaging characteristics of a benign hepatic hemangioma. 2. No other liver lesions are identified. No intra or extrahepatic biliary dilatation. 3. No acute abdominal findings and no adenopathy.    Assessment & Plan:  I personally reviewed all images and lab data in the [facility] system as well as any outside material available during this office visit and agree with the  radiology impressions.   Chronic migraine without aura, with intractable migraine, so stated, with status migrainosus Chronic migraines markedly improved with recent Botox injections  Hypertension Hypertension appears to be under good control without the use of any medications  COPD with chronic bronchitis (HCC) COPD with acute bronchitis for this we will prescribe Augmentin twice daily for 10 days and pulse prednisone  There is likely a sinusitis component  The patient had Covid in November and I am concerned there is a post viral element to this patient's cough  Alcohol dependence with uncomplicated withdrawal ([place]) The patient states he has been sober now for 90 days without further alcohol use  Chronic insomnia We will reorder the sleep study and attempt to get this approved through insurance  MDD (major depressive disorder), recurrent severe, without psychosis ([place]) Major depressive disorder  Will refer again to psychiatry and attempt to get this approved  Tobacco use I recommended the patient receive nicotine lozenges to take 4 mg 4 times daily as needed for smoking cessation   Leotha was seen today for dizziness.  Diagnoses and all orders for this visit:  Chronic insomnia -     PSG Sleep Study; Future  Daytime  hypersomnolence -     PSG Sleep Study; Future  Snoring -     PSG Sleep Study; Future  MDD (major depressive disorder), recurrent severe, without psychosis ([place]) -     Ambulatory referral to Psychiatry  Chronic migraine without aura, with intractable migraine, so stated, with status migrainosus  Essential hypertension  COPD with chronic bronchitis ([place])  Alcohol dependence with uncomplicated withdrawal ([place])  Tobacco use  Other orders -     amoxicillin-clavulanate (AUGMENTIN) 875-125 MG tablet; Take 1 tablet by mouth 2 (two) times daily. -     predniSONE (DELTASONE) 10 MG tablet; Take 4 tablets daily for 5 days then stop -     dextromethorphan-guaiFENesin (MUCINEX DM) 30-600 MG 12hr tablet; Take 1 tablet by mouth 2 (two) times daily. -     fluticasone (  FLONASE) 50 MCG/ACT nasal spray; Place 2 sprays into both nostrils daily.    Follow Up Instructions: The patient knows will be an in office exam in January   I discussed the assessment and treatment plan with the patient. The patient was provided an opportunity to ask questions and all were answered. The patient agreed with the plan and demonstrated an understanding of the instructions.   The patient was advised to call back or seek an in-person evaluation if the symptoms worsen or if the condition fails to improve as anticipated.  I provided 30 minutes of non-face-to-face time during this encounter  including  median intraservice time , review of notes, labs, imaging, medications  and explaining diagnosis and management to the patient .    Asencion Noble, MD

## 2019-11-09 NOTE — Progress Notes (Signed)
Patient is having dizziness when standing coughing fever sneezing.

## 2019-11-09 NOTE — Assessment & Plan Note (Signed)
We will reorder the sleep study and attempt to get this approved through insurance

## 2019-11-09 NOTE — Assessment & Plan Note (Signed)
I recommended the patient receive nicotine lozenges to take 4 mg 4 times daily as needed for smoking cessation

## 2019-11-15 ENCOUNTER — Telehealth: Payer: Self-pay | Admitting: Critical Care Medicine

## 2019-11-15 ENCOUNTER — Other Ambulatory Visit: Payer: Self-pay | Admitting: *Deleted

## 2019-11-15 MED ORDER — ONDANSETRON HCL 4 MG PO TABS: 4.0000 mg | ORAL_TABLET | Freq: Four times a day (QID) | ORAL | 0 refills | Status: DC | PRN

## 2019-11-15 NOTE — Telephone Encounter (Signed)
Patient called saying he thinks the amoxicillin-clavulanate (AUGMENTIN) 875-125 MG tablet  Is making him nauseas and patient is out of Zofran. Patient would like an Rx for Zofran. Please f/u.

## 2019-11-15 NOTE — Telephone Encounter (Signed)
Spencer Mendoza. I am traveling today and cannot access [facility]. I am ok rx zofran 4mg  q6h prn nausea to our pharmacy [id number]   Can someone send a verbal order and i can cosign or ask dr Margarita Rana

## 2019-11-17 DIAGNOSIS — F4310 Post-traumatic stress disorder, unspecified: Secondary | ICD-10-CM | POA: Diagnosis not present

## 2019-11-30 ENCOUNTER — Ambulatory Visit: Payer: Medicare Other | Attending: Critical Care Medicine | Admitting: Critical Care Medicine

## 2019-11-30 ENCOUNTER — Encounter: Payer: Self-pay | Admitting: Critical Care Medicine

## 2019-11-30 ENCOUNTER — Other Ambulatory Visit: Payer: Self-pay

## 2019-11-30 VITALS — BP 119/79 | HR 59 | Temp 97.7°F | Resp 16 | Ht 72.24 in | Wt 169.8 lb

## 2019-11-30 DIAGNOSIS — J449 Chronic obstructive pulmonary disease, unspecified: Secondary | ICD-10-CM | POA: Diagnosis not present

## 2019-11-30 DIAGNOSIS — R569 Unspecified convulsions: Secondary | ICD-10-CM

## 2019-11-30 DIAGNOSIS — F332 Major depressive disorder, recurrent severe without psychotic features: Secondary | ICD-10-CM

## 2019-11-30 DIAGNOSIS — Z720 Tobacco use: Secondary | ICD-10-CM

## 2019-11-30 DIAGNOSIS — D1803 Hemangioma of intra-abdominal structures: Secondary | ICD-10-CM

## 2019-11-30 DIAGNOSIS — F5104 Psychophysiologic insomnia: Secondary | ICD-10-CM

## 2019-11-30 DIAGNOSIS — I10 Essential (primary) hypertension: Secondary | ICD-10-CM

## 2019-11-30 NOTE — Progress Notes (Signed)
Subjective:    Patient ID: Spencer Mendoza, male    DOB: July 06, 1961, 59 y.o.   MRN: [id number]  History of Present Illness: This is a 59 year old white male history of episodic seizures associated with tonic-clonic events and urinary incontinence.  History also of heavy ethanol use and severe generalized anxiety disorder with claustrophobia.  Patient has significant insomnia as well.  The patient has also chronic history of migraines been on multiple medications for this in the past as well.  Patient has a history of substance use including opioids in the past as well.  Patient also uses marijuana at this time.  Patient has history of bipolar disorder and severe depression.  Patient presents today post emergency room visit to establish for primary care.  The patient had been in this clinic previously 5 years ago and wishes to come back to the clinic now to establish.  There is also prior history of stroke.  Also history of nausea vomiting and diarrhea in the past.  The patient was seen in the emergency room on 24 September for similar complaints.  Below is the office visit with neurology on October 8  08/25/19 OV with Neurology Spencer Mendoza is a 59 year old right-handed white male with a history of episodic seizures associated with generalized tonic-clonic events and occasional urinary incontinence.  The patient has had significant issues with alcohol abuse, he may drink up to 12 beers a day.  He has been in the hospital in June, and on 17 September with seizures, he was in the hospital on 11 August 2019 with gastroenteritis, a CT scan of the abdomen at that time showed a low-density area in the liver that was new, he will be evaluated for possible liver cancer in the near future.  The patient has a history of bipolar disorder, he is not followed through psychiatry.  He reports a lot of problems with claustrophobia and GAD.  He is not sleeping well, he indicates that severe insomnia may worsen his  seizure control.  He is on trazodone 100 mg at night but this is not helpful.  The patient continues to have headaches almost daily or every other day, he may have a severe headache at least once a week.  He has been on a multitude of medications in the past for migraine including propranolol, amitriptyline, Topamax, Depakote, Effexor, gabapentin, Seroquel, Prozac, and Aimovig.  None of these modalities have been effective in controlling his headache.  The patient returns for an evaluation.   1.  History of seizures  2.  Alcohol abuse, marijuana abuse  3.  Chronic insomnia  4.  Chronic intractable migraine  5.  Anxiety, bipolar disorder  The patient will be increased on the trazodone to 150 mg at night, he will call for any dose adjustments.  He will be set up for Botox therapy.  The patient will go up on the Keppra dosing to 750 mg twice daily.  He will follow-up here in 6 months, likely be seen sooner for Botox therapy.  Below is the emergency room visit September 24 In ED 9/24: Spencer Mendoza is a 59 y.o. male with history of bipolar, epilepsy, hypertension, migraine, CVA, substance abuse, EtOH abuse presents today for abdominal pain nausea vomiting diarrhea and rash.  Patient reports that on Sunday evening he ate shrimp that he found in his refrigerator.  He reports that he was unaware of it at that time but shrimp had been in the refrigerator for greater than 12  days.  He reports that on Sunday night he developed abdominal pain and diarrhea.  He describes a generalized abdominal pain a severe cramping sensation constant without aggravating or alleviating symptoms or radiation.  Abdominal pain has been constant for the past 4 days.  Patient reports that on Monday morning he began taking a new medication prescribed by his neurologist, Lamictal for seizures in addition to his normal Keppra.  He reports that shortly after taking the Lamictal on Monday morning his diarrhea improved but  was followed by nausea and vomiting.  He describes multiple episodes of nonbloody/nonbilious emesis over the past 3 days with continued abdominal pain.  Patient reports that on Tuesday the day after he began taking Lamictal he developed a diffuse red rash to his torso that has remained constant since that time.  Reports feeling warm and cold but denies measured fever at home.  Denies headache/vision changes, neck pain, difficulty swallowing, feeling of throat closing, burning of the mouth, chest pain/shortness of breath, dysuria/hematuria, fall/injury or any additional concerns.   On initial evaluation patient is overall well-appearing and in no acute distress, not actively vomiting.  He has mild abdominal tenderness generalized without peritoneal signs.  He has a diffuse mild erythematous rash of the anterior torso.  He has no airway or mucosal involvement.  Discussed case with Dr. Roslynn Amble, discussed possible etiologies such as squamoid poisoning, feel this is unlikely based on history and physical examination but will give Benadryl today.  He did begin taking Lamictal just prior to onset of his rash, review of notes from [facility] neurology show that they recommended that he stop taking Lamictal today but continue with his Keppra. - Patient seen and evaluated by Dr. Roslynn Amble advises that if no acute findings on CT abdomen pelvis will discharge with Benadryl and outpatient follow-up. - CBC with extensions of 11.4 Lipase within normal limits CMP with anion gap 16, suspect secondary to dehydration Urinalysis with ketones and elevated specific gravity, suspect secondary to dehydration Ethanol negative, patient does not appear to be in alcohol withdrawal  CT abdomen pelvis:  IMPRESSION: No acute CT finding to account for abdominal pain.  There is a small hypodense/hypoenhancing lesion within segment 7 of the liver which was not present on the comparison noncontrast CT. The enhancement  characteristics are suspicious, and while this could represent a vascular lesion, malignancy (either primary or metastatic) cannot be excluded. Nonemergent follow-up contrast-enhanced liver protocol MRI is recommended. These results were discussed by telephone at the time of interpretation on 08/11/2019 at 5:21 pm with Dr. Nuala Alpha.  Aortic Atherosclerosis (ICD10-I70.0). - Discussed case with in-house pharmacy who advises that patient take Benadryl 25 mg twice daily x7 days for his rash and discontinue lamotrigine, follow-up for recheck early next week. - Patient reevaluated resting comfortably and in no acute distress.  On reevaluation abdomen is soft without peritoneal signs.  Patient has been updated on results today and he states understanding of need for follow-up.  He will be given referral to gastroenterologist for further evaluation encouraged to call office tomorrow morning.  He will be discharged with Zofran for nausea and vomiting.  Patient advised to stop taking lamotrigine immediately and to call his neurologist tomorrow morning to advise them of this.  He has Keppra at home that he will continue taking for his symptoms.  Patient does not appear to be having Stevens-Johnson syndrome at this time, he is overall well-appearing and in no acute distress.  He is advised to take Benadryl 25  mg twice daily x7 days and to follow-up with neurologist and PCP on Monday.  Note the patient does need a follow-up MRI of the liver because of the liver lesion seen which could represent a malignancy or metastatic lesion.  Contrast-enhanced liver protocol MRI was recommended.  The patient now has been sober for 3 weeks.  Is not drinking alcohol currently.  This patient had been previously in mental health programs for drug substance use and depression.  He has been on multiple depression medications in the past.  He has been on trazodone for sleep in the past.  He also has been on Prozac.  He also  used IV heroin in the past and also cocaine and Dilaudid in the past.  He does obtain off the street Xanax and uses this as needed at home.   10/03/2019 This patient is seen today in return follow-up from the mid October visit as documented above.  The patient currently is not drinking alcohol at this time.  He still suffers from significant depression and anxiety.  We tried sertraline on him 50 mg daily but he experienced erectile dysfunction and had to stop this medication.  He is continue the thiamine [id number] mg daily folic acid 1 mg daily.  The patient is currently not on treatment for hypertension as his blood pressures been well controlled.  Note this patient underwent an MRI of the liver to look for any abnormalities in the liver when an abdominal CT scan suggested potential liver cancer.  The MRI in fact showed a benign hepatic hemangioma and nothing else.  I went over with the patient this result previously on the phone today we went over this visit the results as well face-to-face.  The patient states he still has significant anxiety and depression and has not been able to achieve a psychiatry appointment as of yet.  He did meet with the licensed Education officer, [employer]. The patient is still smoking a pack a day of cigarettes.  He has not attempted to use nicotine to replace cigarettes as of yet.  He is maintaining his inhalers for COPD. Patient does complain of significant ongoing insomnia though he is on the trazodone 200 mg a day he gets him to sleep however he awakens in the middle the night with some choking sensations. The patient has completely stopped drinking alcohol  11/09/2019 This is a follow-up telephone visit for this patient who was last seen in November.  The patient has recently received Botox injections for his migraine syndrome and this is markedly improved his symptoms.  He maintains his seizure medications as well per neurology.  He is still smoking about half a pack a day of cigarettes.   He complains of increased cough increased sinus pressure increased sneezing low-grade fever dizziness.  He was Covid + November 22 and at that time had more gastrointestinal symptoms.  He had a girlfriend was positive as well. He has yet to achieve a psychiatry visit for his major depressive disorder.  His insurance did not approve the referrals we made previously.  Also the patient continues with his insomnia and hypersomnolence during the day and is yet to achieve a sleep study due to barriers from [employer] as well.  He notes thick yellow mucus coming out of the nose at this time. The patient is using the Symbicort inhaler as prescribed   11/30/2019 The patient is seen today and is in very good spirits and in much improved mood and affect.  He  states the trazodone has helped him at night during sleep.  He has seen a mental health provider at [facility] at this time and is awaiting further recommendations from [facility] on mental health therapy.  He has a follow-up visit with a therapist coming up soon.  The patient's migraine headaches are significantly improved and stable at this time.  In addition the patient has been abstinent from alcohol for over 9 weeks.  Patient is still smoking small amounts and is interested in further smoking cessation assistance.  He does have good breathing at this time and is on the Symbicort.  There have been no additional seizures and he maintains on Keppra and as well the patient is on folic acid and thiamine while off alcohol   There has not been any additional abdominal pain since the last visit  Past Medical History:  Diagnosis Date  . Alcohol withdrawal ([place]) 07/29/2013  . Allergy   . Anxiety   . Benzodiazepine abuse ([place]) 08/30/2019  . Bipolar disorder ([place])   . Chronic insomnia 06/06/2015  . Chronic migraine without aura, with intractable migraine, so stated, with status migrainosus 08/25/2019  . Cocaine abuse ([place]) 05/22/2015  . Delirium tremens  ([place]) 07/29/2013  . Depression   . Epilepsy ([place])   . Hemiplegic migraine with status migrainosus 06/06/2015  . Hypertension   . Migraine   . Opioid use disorder ([place]) 08/30/2019  . Stroke [facility])    patient denies  . Substance abuse ([place])      Family History  Problem Relation Age of Onset  . Heart disease Mother   . Stroke Mother   . Epilepsy Mother   . Heart disease Father   . Hypertension Father   . Melanoma Father   . Migraines Father   . Colon cancer Sister   . Colon cancer Cousin      Social History   Socioeconomic History  . Marital status: Divorced    Spouse name: Not on file  . Number of children: 0  . Years of education: GED  . Highest education level: Not on file  Occupational History  . Occupation: disabled  Tobacco Use  . Smoking status: Current Every Day Smoker    Packs/day: 0.50    Years: 15.00    Pack years: 7.50    Types: Cigarettes  . Smokeless tobacco: Never Used  Substance and Sexual Activity  . Alcohol use: Yes    Alcohol/week: 6.0 standard drinks    Types: 6 Cans of beer per week    Comment: pt reports no ETOH and no durgs in 3 moths.   . Drug use: Yes    Frequency: 3.0 times per week    Types: Marijuana    Comment: crack, heroin pt reports no ETOH and no durgs in 3 moths.   . Sexual activity: Yes    Birth control/protection: Condom  Other Topics Concern  . Not on file  Social History Narrative   Pt lives with sister.   Patient drinks 2 cups of caffeine daily.   Patient is right handed.   Social Determinants of Health   Financial Resource Strain:   . Difficulty of Paying Living Expenses: Not on file  Food Insecurity:   . Worried About Charity fundraiser in the Last Year: Not on file  . Ran Out of Food in the Last Year: Not on file  Transportation Needs:   . Lack of Transportation (Medical): Not on file  . Lack of Transportation (Non-Medical): Not  on file  Physical Activity:   . Days of Exercise per Week: Not on file  . Minutes  of Exercise per Session: Not on file  Stress:   . Feeling of Stress : Not on file  Social Connections:   . Frequency of Communication with Friends and Family: Not on file  . Frequency of Social Gatherings with Friends and Family: Not on file  . Attends Religious Services: Not on file  . Active Member of Clubs or Organizations: Not on file  . Attends Archivist Meetings: Not on file  . Marital Status: Not on file  Intimate Partner Violence:   . Fear of Current or Ex-Partner: Not on file  . Emotionally Abused: Not on file  . Physically Abused: Not on file  . Sexually Abused: Not on file     Allergies  Allergen Reactions  . Sertraline Other (See Comments)    Erectile dysfunction  . Codeine Nausea And Vomiting  . Depakote [Divalproex Sodium] Other (See Comments)    Made patient shake  . Migranal [Dihydroergotamine] Nausea And Vomiting  . Topamax [Topiramate] Other (See Comments)    Made patient angry     Outpatient Medications Prior to Visit  Medication Sig Dispense Refill  . albuterol (VENTOLIN HFA) 108 (90 Base) MCG/ACT inhaler Inhale 2 puffs into the lungs every 4 (four) hours as needed for shortness of breath or wheezing. 18 g 1  . budesonide-formoterol (SYMBICORT) 160-4.5 MCG/ACT inhaler Inhale 2 puffs into the lungs 2 (two) times daily. 1 Inhaler 12  . diphenhydrAMINE (BENADRYL) 25 MG tablet Take 1 tablet (25 mg total) by mouth 2 (two) times daily. 20 tablet 0  . folic acid (FOLVITE) 1 MG tablet Take 1 tablet (1 mg total) by mouth daily. 30 tablet 3  . levETIRAcetam (KEPPRA) 750 MG tablet Take 1 tablet (750 mg total) by mouth 2 (two) times daily. 180 tablet 1  . ondansetron (ZOFRAN) 4 MG tablet Take 1 tablet (4 mg total) by mouth every 6 (six) hours as needed for nausea or vomiting. 10 tablet 0  . thiamine 100 MG tablet Take 1 tablet (100 mg total) by mouth daily. 30 tablet 3  . traZODone (DESYREL) 100 MG tablet Take 2 tablets (200 mg total) by mouth at bedtime. 60  tablet 2  . nicotine polacrilex (NICORETTE MINI) 4 MG lozenge Use one 4 times daily to stop smoking (Patient not taking: Reported on 11/30/2019) 100 tablet 4  . ondansetron (ZOFRAN ODT) 4 MG disintegrating tablet Take 1 tablet (4 mg total) by mouth every 8 (eight) hours as needed for nausea or vomiting. (Patient not taking: Reported on 11/30/2019) 12 tablet 0  . amoxicillin-clavulanate (AUGMENTIN) 875-125 MG tablet Take 1 tablet by mouth 2 (two) times daily. (Patient not taking: Reported on 11/30/2019) 20 tablet 0  . dexamethasone (DECADRON) 2 MG tablet Take 3 tablets the first day, 2 the second and 1 the third day (Patient not taking: Reported on 11/30/2019) 6 tablet 1  . dextromethorphan-guaiFENesin (MUCINEX DM) 30-600 MG 12hr tablet Take 1 tablet by mouth 2 (two) times daily. (Patient not taking: Reported on 11/30/2019) 60 tablet 1  . fluticasone (FLONASE) 50 MCG/ACT nasal spray Place 2 sprays into both nostrils daily. (Patient not taking: Reported on 11/30/2019) 16 g 6  . predniSONE (DELTASONE) 10 MG tablet Take 4 tablets daily for 5 days then stop (Patient not taking: Reported on 11/30/2019) 10 tablet 0   No facility-administered medications prior to visit.  Review of Systems Constitutional:    weight loss, night sweats,  Fevers, chills, fatigue, lassitude. HEENT:   No headaches,  Difficulty swallowing,  Tooth/dental problems,  Sore throat,               o sneezing, itching, ear ache, nasal congestion, post nasal drip,   CV:  No chest pain,  Orthopnea, PND, swelling in lower extremities, anasarca, dizziness, palpitations  GI   heartburn, indigestion, abdominal pain, nausea, vomiting, diarrhea, change in bowel habits, loss of appetite  Resp:  shortness of breath with exertion or at rest.  No excess mucus, nproductive cough,  No non-productive cough,  No coughing up of blood.  No change in color of mucus.  No wheezing.  No chest wall deformity  Skin: no rash or lesions.  GU: no dysuria,  change in color of urine, no urgency or frequency.  No flank pain.  MS:  No joint pain or swelling.  No decreased range of motion.  No back pain.  Psych:   change in mood or affect.  depression or anxiety.  No memory loss.     Objective:   Physical Exam Vitals:   11/30/19 1014  BP: 119/79  Pulse: (!) 59  Resp: 16  Temp: 97.7 F (36.5 C)  SpO2: 99%  Weight: 169 lb 12.8 oz (77 kg)  Height: 6' 0.24" (1.835 m)   Vitals:   11/30/19 1014  BP: 119/79  Pulse: (!) 59  Resp: 16  Temp: 97.7 F (36.5 C)  SpO2: 99%  Weight: 169 lb 12.8 oz (77 kg)  Height: 6' 0.24" (1.835 m)    Gen: Pleasant, much improved affect from the previous office visits and phone visit well-nourished, in no distress,  normal affect  ENT: No lesions,  mouth clear,  oropharynx clear, no postnasal drip  Neck: No JVD, no TMG, no carotid bruits  Lungs: No use of accessory muscles, no dullness to percussion, clear without rales or rhonchi  Cardiovascular: RRR, heart sounds normal, no murmur or gallops, no peripheral edema  Abdomen: soft and NT, no HSM,  BS normal  Musculoskeletal: No deformities, no cyanosis or clubbing  Neuro: alert, non focal  Skin: Warm, no lesions or rashes   08/11/19 CT abdomen pelvis:   IMPRESSION: No acute CT finding to account for abdominal pain.  Hepatobiliary: Rounded hypodense lesion in segment 7, image 12 series 3. Lesion measures 16 mm in greatest diameter, and was not present or visualized on the prior noncontrast CT. There is differential enhancement pattern with less distinct border on the delayed CT images.  There is a small hypodense/hypoenhancing lesion within segment 7 of the liver which was not present on the comparison noncontrast CT. The enhancement characteristics are suspicious, and while this could represent a vascular lesion, malignancy (either primary or metastatic) cannot be excluded. Nonemergent follow-up contrast-enhanced liver protocol MRI is  recommended. These results were discussed by telephone at the time of interpretation on  MRI 09/20/19:  IMPRESSION: 1. 16 mm segment 7 liver lesion has MR imaging characteristics of a benign hepatic hemangioma. 2. No other liver lesions are identified. No intra or extrahepatic biliary dilatation. 3. No acute abdominal findings and no adenopathy.    Assessment & Plan:  I personally reviewed all images and lab data in the [facility] system as well as any outside material available during this office visit and agree with the  radiology impressions.   Hypertension Hypertension under good control without treatment we will monitor for now  Liver hemangioma Previous liver hemangioma had been identified on MRI of the abdomen in November therefore no evidence of malignancy in the liver  COPD with chronic bronchitis (HCC) Mild COPD now improved on Symbicort  Chronic insomnia Chronic insomnia stable on trazodone however follow-up sleep study has been scheduled and we will see if there is a component of sleep apnea  MDD (major depressive disorder), recurrent severe, without psychosis ([place]) Mood disorder has stabilized and there is follow-up with his mental health provider  Tobacco use History of tobacco use patient is now committed to quitting and will use nicotine replacement therapy in this endeavor using nicotine lozenges   Spencer Mendoza was seen today for follow-up.  Diagnoses and all orders for this visit:  COPD with chronic bronchitis ([place])  Tobacco use  MDD (major depressive disorder), recurrent severe, without psychosis ([place])  Seizures ([place])  Essential hypertension  Liver hemangioma  Chronic insomnia

## 2019-11-30 NOTE — Patient Instructions (Signed)
No medication changes  Keep sleep study appt when it occurs  Keep follow up visits with your mental health provider at [facility]  Return to Dr Joya Gaskins 6 months  Refills on all meds sent to pharmacy  Smoking cessation with nicotine replacement

## 2019-11-30 NOTE — Progress Notes (Signed)
F/U visit

## 2019-12-01 ENCOUNTER — Encounter: Payer: Self-pay | Admitting: Critical Care Medicine

## 2019-12-01 NOTE — Assessment & Plan Note (Signed)
Mild COPD now improved on Symbicort

## 2019-12-01 NOTE — Assessment & Plan Note (Signed)
Mood disorder has stabilized and there is follow-up with his mental health provider

## 2019-12-01 NOTE — Assessment & Plan Note (Signed)
Previous liver hemangioma had been identified on MRI of the abdomen in November therefore no evidence of malignancy in the liver

## 2019-12-01 NOTE — Assessment & Plan Note (Signed)
Chronic insomnia stable on trazodone however follow-up sleep study has been scheduled and we will see if there is a component of sleep apnea

## 2019-12-01 NOTE — Assessment & Plan Note (Signed)
History of tobacco use patient is now committed to quitting and will use nicotine replacement therapy in this endeavor using nicotine lozenges

## 2019-12-01 NOTE — Assessment & Plan Note (Signed)
Hypertension under good control without treatment we will monitor for now

## 2019-12-27 ENCOUNTER — Other Ambulatory Visit: Payer: Self-pay

## 2019-12-27 ENCOUNTER — Telehealth: Payer: Self-pay

## 2019-12-27 ENCOUNTER — Telehealth: Payer: Self-pay | Admitting: Critical Care Medicine

## 2019-12-27 MED ORDER — THIAMINE HCL 100 MG PO TABS: 100.0000 mg | ORAL_TABLET | Freq: Every day | ORAL | 3 refills | Status: DC

## 2019-12-27 MED ORDER — FOLIC ACID 1 MG PO TABS: 1.0000 mg | ORAL_TABLET | Freq: Every day | ORAL | 3 refills | Status: DC

## 2019-12-27 MED ORDER — TRAZODONE HCL 100 MG PO TABS: 200.0000 mg | ORAL_TABLET | Freq: Every day | ORAL | 11 refills | Status: DC

## 2019-12-27 NOTE — Telephone Encounter (Signed)
Rx sent 

## 2019-12-27 NOTE — Telephone Encounter (Signed)
1) Medication(s) Requested (by name): traZODone (DESYREL) 100 MG tablet  2) Pharmacy of Choice: [facility], [address] AT [address]  [address], [place] [id number]

## 2019-12-27 NOTE — Telephone Encounter (Signed)
1) Medication(s) Requested (by name): -folic acid (FOLVITE) 1 MG tablet  -Vitamin B  2) Pharmacy of Choice: -[facility] [place] [place]

## 2019-12-27 NOTE — Telephone Encounter (Signed)
Medication sent to [facility] [address].

## 2020-01-03 ENCOUNTER — Ambulatory Visit: Payer: Medicare HMO | Admitting: Critical Care Medicine

## 2020-01-03 NOTE — Progress Notes (Deleted)
Subjective:    Patient ID: Spencer Mendoza, male    DOB: [birth year], 59 y.o.   MRN: [id number]  History of Present Illness: This is a 59 year old white male history of episodic seizures associated with tonic-clonic events and urinary incontinence.  History also of heavy ethanol use and severe generalized anxiety disorder with claustrophobia.  Patient has significant insomnia as well.  The patient has also chronic history of migraines been on multiple medications for this in the past as well.  Patient has a history of substance use including opioids in the past as well.  Patient also uses marijuana at this time.  Patient has history of bipolar disorder and severe depression.  Patient presents today post emergency room visit to establish for primary care.  The patient had been in this clinic previously 5 years ago and wishes to come back to the clinic now to establish.  There is also prior history of stroke.  Also history of nausea vomiting and diarrhea in the past.  The patient was seen in the emergency room on 24 September for similar complaints.  Below is the office visit with neurology on October 8  08/25/19 OV with Neurology Spencer Mendoza is a 59 year old right-handed white male with a history of episodic seizures associated with generalized tonic-clonic events and occasional urinary incontinence.  The patient has had significant issues with alcohol abuse, he may drink up to 12 beers a day.  He has been in the hospital in June, and on 17 September with seizures, he was in the hospital on 11 August 2019 with gastroenteritis, a CT scan of the abdomen at that time showed a low-density area in the liver that was new, he will be evaluated for possible liver cancer in the near future.  The patient has a history of bipolar disorder, he is not followed through psychiatry.  He reports a lot of problems with claustrophobia and GAD.  He is not sleeping well, he indicates that severe insomnia may worsen his  seizure control.  He is on trazodone 100 mg at night but this is not helpful.  The patient continues to have headaches almost daily or every other day, he may have a severe headache at least once a week.  He has been on a multitude of medications in the past for migraine including propranolol, amitriptyline, Topamax, Depakote, Effexor, gabapentin, Seroquel, Prozac, and Aimovig.  None of these modalities have been effective in controlling his headache.  The patient returns for an evaluation.   1.  History of seizures  2.  Alcohol abuse, marijuana abuse  3.  Chronic insomnia  4.  Chronic intractable migraine  5.  Anxiety, bipolar disorder  The patient will be increased on the trazodone to 150 mg at night, he will call for any dose adjustments.  He will be set up for Botox therapy.  The patient will go up on the Keppra dosing to 750 mg twice daily.  He will follow-up here in 6 months, likely be seen sooner for Botox therapy.  Below is the emergency room visit September 24 In ED 9/24: Spencer Mendoza is a 59 y.o. male with history of bipolar, epilepsy, hypertension, migraine, CVA, substance abuse, EtOH abuse presents today for abdominal pain nausea vomiting diarrhea and rash.  Patient reports that on Sunday evening he ate shrimp that he found in his refrigerator.  He reports that he was unaware of it at that time but shrimp had been in the refrigerator for greater than 12  days.  He reports that on Sunday night he developed abdominal pain and diarrhea.  He describes a generalized abdominal pain a severe cramping sensation constant without aggravating or alleviating symptoms or radiation.  Abdominal pain has been constant for the past 4 days.  Patient reports that on Monday morning he began taking a new medication prescribed by his neurologist, Lamictal for seizures in addition to his normal Keppra.  He reports that shortly after taking the Lamictal on Monday morning his diarrhea improved but  was followed by nausea and vomiting.  He describes multiple episodes of nonbloody/nonbilious emesis over the past 3 days with continued abdominal pain.  Patient reports that on Tuesday the day after he began taking Lamictal he developed a diffuse red rash to his torso that has remained constant since that time.  Reports feeling warm and cold but denies measured fever at home.  Denies headache/vision changes, neck pain, difficulty swallowing, feeling of throat closing, burning of the mouth, chest pain/shortness of breath, dysuria/hematuria, fall/injury or any additional concerns.   On initial evaluation patient is overall well-appearing and in no acute distress, not actively vomiting.  He has mild abdominal tenderness generalized without peritoneal signs.  He has a diffuse mild erythematous rash of the anterior torso.  He has no airway or mucosal involvement.  Discussed case with Dr. Roslynn Amble, discussed possible etiologies such as squamoid poisoning, feel this is unlikely based on history and physical examination but will give Benadryl today.  He did begin taking Lamictal just prior to onset of his rash, review of notes from [facility] neurology show that they recommended that he stop taking Lamictal today but continue with his Keppra. - Patient seen and evaluated by Dr. Roslynn Amble advises that if no acute findings on CT abdomen pelvis will discharge with Benadryl and outpatient follow-up. - CBC with extensions of 11.4 Lipase within normal limits CMP with anion gap 16, suspect secondary to dehydration Urinalysis with ketones and elevated specific gravity, suspect secondary to dehydration Ethanol negative, patient does not appear to be in alcohol withdrawal  CT abdomen pelvis:  IMPRESSION: No acute CT finding to account for abdominal pain.  There is a small hypodense/hypoenhancing lesion within segment 7 of the liver which was not present on the comparison noncontrast CT. The enhancement  characteristics are suspicious, and while this could represent a vascular lesion, malignancy (either primary or metastatic) cannot be excluded. Nonemergent follow-up contrast-enhanced liver protocol MRI is recommended. These results were discussed by telephone at the time of interpretation on 08/11/2019 at 5:21 pm with Dr. Nuala Alpha.  Aortic Atherosclerosis (ICD10-I70.0). - Discussed case with in-house pharmacy who advises that patient take Benadryl 25 mg twice daily x7 days for his rash and discontinue lamotrigine, follow-up for recheck early next week. - Patient reevaluated resting comfortably and in no acute distress.  On reevaluation abdomen is soft without peritoneal signs.  Patient has been updated on results today and he states understanding of need for follow-up.  He will be given referral to gastroenterologist for further evaluation encouraged to call office tomorrow morning.  He will be discharged with Zofran for nausea and vomiting.  Patient advised to stop taking lamotrigine immediately and to call his neurologist tomorrow morning to advise them of this.  He has Keppra at home that he will continue taking for his symptoms.  Patient does not appear to be having Stevens-Johnson syndrome at this time, he is overall well-appearing and in no acute distress.  He is advised to take Benadryl 25  mg twice daily x7 days and to follow-up with neurologist and PCP on Monday.  Note the patient does need a follow-up MRI of the liver because of the liver lesion seen which could represent a malignancy or metastatic lesion.  Contrast-enhanced liver protocol MRI was recommended.  The patient now has been sober for 3 weeks.  Is not drinking alcohol currently.  This patient had been previously in mental health programs for drug substance use and depression.  He has been on multiple depression medications in the past.  He has been on trazodone for sleep in the past.  He also has been on Prozac.  He also  used IV heroin in the past and also cocaine and Dilaudid in the past.  He does obtain off the street Xanax and uses this as needed at home.   10/03/2019 This patient is seen today in return follow-up from the mid October visit as documented above.  The patient currently is not drinking alcohol at this time.  He still suffers from significant depression and anxiety.  We tried sertraline on him 50 mg daily but he experienced erectile dysfunction and had to stop this medication.  He is continue the thiamine [id number] mg daily folic acid 1 mg daily.  The patient is currently not on treatment for hypertension as his blood pressures been well controlled.  Note this patient underwent an MRI of the liver to look for any abnormalities in the liver when an abdominal CT scan suggested potential liver cancer.  The MRI in fact showed a benign hepatic hemangioma and nothing else.  I went over with the patient this result previously on the phone today we went over this visit the results as well face-to-face.  The patient states he still has significant anxiety and depression and has not been able to achieve a psychiatry appointment as of yet.  He did meet with the licensed Education officer, [employer]. The patient is still smoking a pack a day of cigarettes.  He has not attempted to use nicotine to replace cigarettes as of yet.  He is maintaining his inhalers for COPD. Patient does complain of significant ongoing insomnia though he is on the trazodone 200 mg a day he gets him to sleep however he awakens in the middle the night with some choking sensations. The patient has completely stopped drinking alcohol  11/09/2019 This is a follow-up telephone visit for this patient who was last seen in November.  The patient has recently received Botox injections for his migraine syndrome and this is markedly improved his symptoms.  He maintains his seizure medications as well per neurology.  He is still smoking about half a pack a day of cigarettes.   He complains of increased cough increased sinus pressure increased sneezing low-grade fever dizziness.  He was Covid + November 22 and at that time had more gastrointestinal symptoms.  He had a girlfriend was positive as well. He has yet to achieve a psychiatry visit for his major depressive disorder.  His insurance did not approve the referrals we made previously.  Also the patient continues with his insomnia and hypersomnolence during the day and is yet to achieve a sleep study due to barriers from [employer] as well.  He notes thick yellow mucus coming out of the nose at this time. The patient is using the Symbicort inhaler as prescribed   11/30/2019 The patient is seen today and is in very good spirits and in much improved mood and affect.  He  states the trazodone has helped him at night during sleep.  He has seen a mental health provider at [facility] at this time and is awaiting further recommendations from [facility] on mental health therapy.  He has a follow-up visit with a therapist coming up soon.  The patient's migraine headaches are significantly improved and stable at this time.  In addition the patient has been abstinent from alcohol for over 9 weeks.  Patient is still smoking small amounts and is interested in further smoking cessation assistance.  He does have good breathing at this time and is on the Symbicort.  There have been no additional seizures and he maintains on Keppra and as well the patient is on folic acid and thiamine while off alcohol   There has not been any additional abdominal pain since the last visit  Past Medical History:  Diagnosis Date  . Alcohol withdrawal ([place]) 07/29/2013  . Allergy   . Anxiety   . Benzodiazepine abuse ([place]) 08/30/2019  . Bipolar disorder ([place])   . Chronic insomnia 06/06/2015  . Chronic migraine without aura, with intractable migraine, so stated, with status migrainosus 08/25/2019  . Cocaine abuse ([place]) 05/22/2015  . Delirium tremens  ([place]) 07/29/2013  . Depression   . Epilepsy ([place])   . Hemiplegic migraine with status migrainosus 06/06/2015  . Hypertension   . Migraine   . Opioid use disorder ([place]) 08/30/2019  . Stroke [facility])    patient denies  . Substance abuse ([place])      Family History  Problem Relation Age of Onset  . Heart disease Mother   . Stroke Mother   . Epilepsy Mother   . Heart disease Father   . Hypertension Father   . Melanoma Father   . Migraines Father   . Colon cancer Sister   . Colon cancer Cousin      Social History   Socioeconomic History  . Marital status: Divorced    Spouse name: Not on file  . Number of children: 0  . Years of education: GED  . Highest education level: Not on file  Occupational History  . Occupation: disabled  Tobacco Use  . Smoking status: Current Every Day Smoker    Packs/day: 0.50    Years: 15.00    Pack years: 7.50    Types: Cigarettes  . Smokeless tobacco: Never Used  Substance and Sexual Activity  . Alcohol use: Yes    Alcohol/week: 6.0 standard drinks    Types: 6 Cans of beer per week    Comment: pt reports no ETOH and no durgs in 3 moths.   . Drug use: Yes    Frequency: 3.0 times per week    Types: Marijuana    Comment: crack, heroin pt reports no ETOH and no durgs in 3 moths.   . Sexual activity: Yes    Birth control/protection: Condom  Other Topics Concern  . Not on file  Social History Narrative   Pt lives with sister.   Patient drinks 2 cups of caffeine daily.   Patient is right handed.   Social Determinants of Health   Financial Resource Strain:   . Difficulty of Paying Living Expenses: Not on file  Food Insecurity:   . Worried About Charity fundraiser in the Last Year: Not on file  . Ran Out of Food in the Last Year: Not on file  Transportation Needs:   . Lack of Transportation (Medical): Not on file  . Lack of Transportation (Non-Medical): Not  on file  Physical Activity:   . Days of Exercise per Week: Not on file  . Minutes  of Exercise per Session: Not on file  Stress:   . Feeling of Stress : Not on file  Social Connections:   . Frequency of Communication with Friends and Family: Not on file  . Frequency of Social Gatherings with Friends and Family: Not on file  . Attends Religious Services: Not on file  . Active Member of Clubs or Organizations: Not on file  . Attends Archivist Meetings: Not on file  . Marital Status: Not on file  Intimate Partner Violence:   . Fear of Current or Ex-Partner: Not on file  . Emotionally Abused: Not on file  . Physically Abused: Not on file  . Sexually Abused: Not on file     Allergies  Allergen Reactions  . Sertraline Other (See Comments)    Erectile dysfunction  . Codeine Nausea And Vomiting  . Depakote [Divalproex Sodium] Other (See Comments)    Made patient shake  . Migranal [Dihydroergotamine] Nausea And Vomiting  . Topamax [Topiramate] Other (See Comments)    Made patient angry     Outpatient Medications Prior to Visit  Medication Sig Dispense Refill  . albuterol (VENTOLIN HFA) 108 (90 Base) MCG/ACT inhaler Inhale 2 puffs into the lungs every 4 (four) hours as needed for shortness of breath or wheezing. 18 g 1  . budesonide-formoterol (SYMBICORT) 160-4.5 MCG/ACT inhaler Inhale 2 puffs into the lungs 2 (two) times daily. 1 Inhaler 12  . diphenhydrAMINE (BENADRYL) 25 MG tablet Take 1 tablet (25 mg total) by mouth 2 (two) times daily. 20 tablet 0  . folic acid (FOLVITE) 1 MG tablet Take 1 tablet (1 mg total) by mouth daily. 30 tablet 3  . levETIRAcetam (KEPPRA) 750 MG tablet Take 1 tablet (750 mg total) by mouth 2 (two) times daily. 180 tablet 1  . nicotine polacrilex (NICORETTE MINI) 4 MG lozenge Use one 4 times daily to stop smoking (Patient not taking: Reported on 11/30/2019) 100 tablet 4  . ondansetron (ZOFRAN ODT) 4 MG disintegrating tablet Take 1 tablet (4 mg total) by mouth every 8 (eight) hours as needed for nausea or vomiting. (Patient not  taking: Reported on 11/30/2019) 12 tablet 0  . ondansetron (ZOFRAN) 4 MG tablet Take 1 tablet (4 mg total) by mouth every 6 (six) hours as needed for nausea or vomiting. 10 tablet 0  . thiamine 100 MG tablet Take 1 tablet (100 mg total) by mouth daily. 30 tablet 3  . traZODone (DESYREL) 100 MG tablet Take 2 tablets (200 mg total) by mouth at bedtime. 60 tablet 11   No facility-administered medications prior to visit.     Review of Systems Constitutional:    weight loss, night sweats,  Fevers, chills, fatigue, lassitude. HEENT:   No headaches,  Difficulty swallowing,  Tooth/dental problems,  Sore throat,               o sneezing, itching, ear ache, nasal congestion, post nasal drip,   CV:  No chest pain,  Orthopnea, PND, swelling in lower extremities, anasarca, dizziness, palpitations  GI   heartburn, indigestion, abdominal pain, nausea, vomiting, diarrhea, change in bowel habits, loss of appetite  Resp:  shortness of breath with exertion or at rest.  No excess mucus, nproductive cough,  No non-productive cough,  No coughing up of blood.  No change in color of mucus.  No wheezing.  No chest wall deformity  Skin: no rash or lesions.  GU: no dysuria, change in color of urine, no urgency or frequency.  No flank pain.  MS:  No joint pain or swelling.  No decreased range of motion.  No back pain.  Psych:   change in mood or affect.  depression or anxiety.  No memory loss.     Objective:   Physical Exam There were no vitals filed for this visit. There were no vitals filed for this visit.  Gen: Pleasant, much improved affect from the previous office visits and phone visit well-nourished, in no distress,  normal affect  ENT: No lesions,  mouth clear,  oropharynx clear, no postnasal drip  Neck: No JVD, no TMG, no carotid bruits  Lungs: No use of accessory muscles, no dullness to percussion, clear without rales or rhonchi  Cardiovascular: RRR, heart sounds normal, no murmur or gallops,  no peripheral edema  Abdomen: soft and NT, no HSM,  BS normal  Musculoskeletal: No deformities, no cyanosis or clubbing  Neuro: alert, non focal  Skin: Warm, no lesions or rashes   08/11/19 CT abdomen pelvis:   IMPRESSION: No acute CT finding to account for abdominal pain.  Hepatobiliary: Rounded hypodense lesion in segment 7, image 12 series 3. Lesion measures 16 mm in greatest diameter, and was not present or visualized on the prior noncontrast CT. There is differential enhancement pattern with less distinct border on the delayed CT images.  There is a small hypodense/hypoenhancing lesion within segment 7 of the liver which was not present on the comparison noncontrast CT. The enhancement characteristics are suspicious, and while this could represent a vascular lesion, malignancy (either primary or metastatic) cannot be excluded. Nonemergent follow-up contrast-enhanced liver protocol MRI is recommended. These results were discussed by telephone at the time of interpretation on  MRI 09/20/19:  IMPRESSION: 1. 16 mm segment 7 liver lesion has MR imaging characteristics of a benign hepatic hemangioma. 2. No other liver lesions are identified. No intra or extrahepatic biliary dilatation. 3. No acute abdominal findings and no adenopathy.    Assessment & Plan:  I personally reviewed all images and lab data in the [facility] system as well as any outside material available during this office visit and agree with the  radiology impressions.   No problem-specific Assessment & Plan notes found for this encounter.   There are no diagnoses linked to this encounter.

## 2020-01-04 ENCOUNTER — Ambulatory Visit (INDEPENDENT_AMBULATORY_CARE_PROVIDER_SITE_OTHER): Payer: Medicare Other | Admitting: Orthopaedic Surgery

## 2020-01-04 ENCOUNTER — Other Ambulatory Visit: Payer: Self-pay

## 2020-01-04 ENCOUNTER — Ambulatory Visit: Payer: Self-pay

## 2020-01-04 ENCOUNTER — Encounter: Payer: Self-pay | Admitting: Orthopaedic Surgery

## 2020-01-04 VITALS — Ht 72.0 in | Wt 170.0 lb

## 2020-01-04 DIAGNOSIS — M19032 Primary osteoarthritis, left wrist: Secondary | ICD-10-CM

## 2020-01-04 DIAGNOSIS — M19132 Post-traumatic osteoarthritis, left wrist: Secondary | ICD-10-CM

## 2020-01-04 DIAGNOSIS — M79642 Pain in left hand: Secondary | ICD-10-CM

## 2020-01-04 HISTORY — DX: Primary osteoarthritis, left wrist: M19.032

## 2020-01-04 NOTE — Progress Notes (Signed)
Office Visit Note   Patient: Spencer Mendoza           Date of Birth: May 09, 1961           MRN: [id number] Visit Date: 01/04/2020              Requested by: Elsie Stain, MD [address],  [place] [address] PCP: Elsie Stain, MD   Assessment & Plan: Visit Diagnoses:  1. Pain in left hand   2. Post-traumatic osteoarthritis of left wrist     Plan: We reviewed x-rays discussed the diagnosis.  He understands that surgical treatment involves wrist fusion.  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  I recommend if you look for part-time work it would be something that would be easier on the wrist did not involve significant lifting.  The other option is referral to hand surgeon for surgery and he states he is not really interested in that at this time.  He had previously seen the hand surgeon and made recommendations for surgery and patient states at that time he was not interested in surgery either.  X-ray results were reviewed with the patient recommendations were made he can use the wrist splints that he has to look for something easier on his wrist.  If his symptoms get worse he can call about being referred to hand surgeon so we could discuss possible surgery on his wrist if he so desired.  Follow-Up Instructions: No follow-ups on file.   Orders:  Orders Placed This Encounter  Procedures  . XR Hand Complete Left   No orders of the defined types were placed in this encounter.     Procedures: No procedures performed   Clinical Data: No additional findings.   Subjective: Chief Complaint  Patient presents with  . Left Hand - Pain    HPI 59 year old male on disability with some past history of alcohol dependence problems at past history of about 5 or 6 years ago injuring his left wrist when he was in [place] state.  Patient states he did not seek treatment since  he felt he loses his job if he failed a drug test.  He later got seen years later and was told he needed a fusion of his wrist.  4 corner fusion was discussed.  He states he discussed making a hard brace for him but he had to leave the state before it was made.  He has multiple wrist splints and states none of them give him pain relief if he is pulling turning or lifting.  He does not have pain with left supination pronation.  He does have pain with ulnar and radial deviation as well as wrist flexion and extension.  No problems with his opposite wrist.  Patient states he has some memory problems and he is here with his wife.  Review of Systems positive history of migraines low back pain insomnia alcohol dependence substance abuse seizures tobacco use and depression.  Otherwise negative is obtains HPI.   Objective: Vital Signs: Ht 6' (1.829 m)   Wt 170 lb (77.1 kg)   BMI 23.06 kg/m   Physical Exam Constitutional:      Appearance: He is well-developed.  HENT:     Head: Normocephalic and atraumatic.  Eyes:     Pupils: Pupils are equal, round, and reactive to light.  Neck:  Thyroid: No thyromegaly.     Trachea: No tracheal deviation.  Cardiovascular:     Rate and Rhythm: Normal rate.  Pulmonary:     Effort: Pulmonary effort is normal.     Breath sounds: No wheezing.  Abdominal:     General: Bowel sounds are normal.     Palpations: Abdomen is soft.  Skin:    General: Skin is warm and dry.     Capillary Refill: Capillary refill takes less than 2 seconds.  Neurological:     Mental Status: He is alert and oriented to person, place, and time.  Psychiatric:        Behavior: Behavior normal.        Thought Content: Thought content normal.        Judgment: Judgment normal.     Ortho Exam patient is 50% flexion extension of his wrist with pain pain with ulnar radial deviation tenderness over the radial scaphoid joint tenderness over the snuffbox.  Specialty Comments:  No specialty  comments available.  Imaging: XR Hand Complete Left  Result Date: 01/04/2020 Three-view x-rays left hand obtained and reviewed.  This shows old scaphoid fracture with scapholunate advanced collapse and radial scaphoid arthritis. Impression: Advanced degenerative changes in the wrist from old scaphoid fracture with advanced collapse.    PMFS History: Patient Active Problem List   Diagnosis Date Noted  . Osteoarthritis of left wrist 01/04/2020  . Liver hemangioma 09/21/2019  . Tobacco use 08/30/2019  . COPD with chronic bronchitis ([place]) 08/30/2019  . Chronic migraine without aura, with intractable migraine, so stated, with status migrainosus 08/25/2019  . MDD (major depressive disorder), recurrent severe, without psychosis ([place]) 11/18/2018  . Substance induced mood disorder ([place]) 01/30/2018  . Hemiplegic migraine with status migrainosus 06/06/2015  . Chronic insomnia 06/06/2015  . Alcohol dependence with uncomplicated withdrawal ([place])   . Seizures ([place]) 01/09/2014  . Chronic low back pain 12/28/2013  . History of stroke 12/04/2013  . Hypertension 12/04/2013   Past Medical History:  Diagnosis Date  . Alcohol withdrawal ([place]) 07/29/2013  . Allergy   . Anxiety   . Benzodiazepine abuse ([place]) 08/30/2019  . Bipolar disorder ([place])   . Chronic insomnia 06/06/2015  . Chronic migraine without aura, with intractable migraine, so stated, with status migrainosus 08/25/2019  . Cocaine abuse ([place]) 05/22/2015  . Delirium tremens ([place]) 07/29/2013  . Depression   . Epilepsy ([place])   . Hemiplegic migraine with status migrainosus 06/06/2015  . Hypertension   . Migraine   . Opioid use disorder ([place]) 08/30/2019  . Stroke [facility])    patient denies  . Substance abuse ([place])     Family History  Problem Relation Age of Onset  . Heart disease Mother   . Stroke Mother   . Epilepsy Mother   . Heart disease Father   . Hypertension Father   . Melanoma Father   . Migraines Father   . Colon cancer Sister    . Colon cancer Cousin     Past Surgical History:  Procedure Laterality Date  . APPENDECTOMY    . KNEE ARTHROSCOPY Right   . NOSE SURGERY     Social History   Occupational History  . Occupation: disabled  Tobacco Use  . Smoking status: Current Every Day Smoker    Packs/day: 0.50    Years: 15.00    Pack years: 7.50    Types: Cigarettes  . Smokeless tobacco: Never Used  Substance and Sexual Activity  . Alcohol use: Yes  Alcohol/week: 6.0 standard drinks    Types: 6 Cans of beer per week    Comment: pt reports no ETOH and no durgs in 3 moths.   . Drug use: Yes    Frequency: 3.0 times per week    Types: Marijuana    Comment: crack, heroin pt reports no ETOH and no durgs in 3 moths.   . Sexual activity: Yes    Birth control/protection: Condom

## 2020-01-16 ENCOUNTER — Telehealth: Payer: Self-pay

## 2020-01-16 NOTE — Telephone Encounter (Signed)
I called the patient insurance to check coverage and pre cert requirements, I spoke with Charleston Ropes who stated no pre cert was required and the codes were covered. [id number]

## 2020-01-18 ENCOUNTER — Other Ambulatory Visit: Payer: Self-pay

## 2020-01-18 ENCOUNTER — Telehealth: Payer: Self-pay | Admitting: Critical Care Medicine

## 2020-01-18 ENCOUNTER — Encounter: Payer: Self-pay | Admitting: Neurology

## 2020-01-18 ENCOUNTER — Ambulatory Visit (INDEPENDENT_AMBULATORY_CARE_PROVIDER_SITE_OTHER): Payer: Medicare Other | Admitting: Neurology

## 2020-01-18 ENCOUNTER — Other Ambulatory Visit: Payer: Self-pay | Admitting: Pharmacist

## 2020-01-18 VITALS — BP 119/74 | HR 58 | Temp 97.8°F | Ht 72.0 in | Wt 163.0 lb

## 2020-01-18 DIAGNOSIS — G43711 Chronic migraine without aura, intractable, with status migrainosus: Secondary | ICD-10-CM

## 2020-01-18 MED ORDER — ONABOTULINUMTOXINA 100 UNITS IJ SOLR
200.0000 [IU] | Freq: Once | INTRAMUSCULAR | Status: AC
  Administered 2020-01-18: 200 [IU] via INTRAMUSCULAR

## 2020-01-18 MED ORDER — ONDANSETRON 4 MG PO TBDP: 4.0000 mg | ORAL_TABLET | Freq: Three times a day (TID) | ORAL | 0 refills | Status: DC | PRN

## 2020-01-18 NOTE — Telephone Encounter (Signed)
Rx sent 

## 2020-01-18 NOTE — Procedures (Signed)
     BOTOX PROCEDURE NOTE FOR MIGRAINE HEADACHE   HISTORY: Spencer Mendoza is a 59 year old gentleman with a history of intractable migraine headaches.  He comes back for second Botox therapy.  Following the first injection, his headache frequency dramatically improved, he only had 3 or 4 headaches lasting 2 days, is headache frequency reduced to 10-12 headache days a month.  He believes that there has been significant improvement in his headaches following the Botox.   Description of procedure:  The patient was placed in a sitting position. The standard protocol was used for Botox as follows, with 5 units of Botox injected at each site:   -Procerus muscle, midline injection  -Corrugator muscle, bilateral injection  -Frontalis muscle, bilateral injection, with 2 sites each side, medial injection was performed in the upper one third of the frontalis muscle, in the region vertical from the medial inferior edge of the superior orbital rim. The lateral injection was again in the upper one third of the forehead vertically above the lateral limbus of the cornea, 1.5 cm lateral to the medial injection site.  -Temporalis muscle injection, 4 sites, bilaterally. The first injection was 3 cm above the tragus of the ear, second injection site was 1.5 cm to 3 cm up from the first injection site in line with the tragus of the ear. The third injection site was 1.5-3 cm forward between the first 2 injection sites. The fourth injection site was 1.5 cm posterior to the second injection site.  -Occipitalis muscle injection, 3 sites, bilaterally. The first injection was done one half way between the occipital protuberance and the tip of the mastoid process behind the ear. The second injection site was done lateral and superior to the first, 1 fingerbreadth from the first injection. The third injection site was 1 fingerbreadth superiorly and medially from the first injection site.  -Cervical paraspinal muscle  injection, 2 sites, bilateral, the first injection site was 1 cm from the midline of the cervical spine, 3 cm inferior to the lower border of the occipital protuberance. The second injection site was 1.5 cm superiorly and laterally to the first injection site.  -Trapezius muscle injection was performed at 3 sites, bilaterally. The first injection site was in the upper trapezius muscle halfway between the inflection point of the neck, and the acromion. The second injection site was one half way between the acromion and the first injection site. The third injection was done between the first injection site and the inflection point of the neck.   A 200 unit bottle of Botox was used, 155 units were injected, the rest of the Botox was wasted. The patient tolerated the procedure well, there were no complications of the above procedure.  Botox NDC [id number] Lot number [id number] Expiration date October 2023

## 2020-01-18 NOTE — Telephone Encounter (Signed)
Patient is requesting the following medication  ondansetron (ZOFRAN ODT) 4 MG   Please call patient to confirm if he can pick up at pharmacy.

## 2020-01-18 NOTE — Progress Notes (Signed)
**  Botox 200 units x 1 vial, [place] [id number], Lot [id number], Exp 08/2022, office supply.//mck,rn**

## 2020-01-26 NOTE — Telephone Encounter (Signed)
I called to schedule the patient but they did not answer so I left a VM asking them to call me back. I went ahead and scheduled the apt, when the patient calls back please make him aware and confirm this date and time will work for him. DWD

## 2020-02-20 ENCOUNTER — Telehealth: Payer: Self-pay | Admitting: Critical Care Medicine

## 2020-02-20 MED ORDER — ALBUTEROL SULFATE HFA 108 (90 BASE) MCG/ACT IN AERS: 2.0000 | INHALATION_SPRAY | RESPIRATORY_TRACT | 1 refills | Status: DC | PRN

## 2020-02-20 NOTE — Telephone Encounter (Signed)
Refill on albuterol sent to his [facility] pharmacy

## 2020-02-20 NOTE — Telephone Encounter (Signed)
1) Medication(s) Requested (by name):  albuterol (VENTOLIN HFA) 108 (90 Base) MCG/ACT inhaler    2) Pharmacy of Choice:  [facility] # [phone]  [place]    3) Special Requests:   Approved medications will be sent to the pharmacy, we will reach out if there is an issue.  Requests made after 3pm may not be addressed until the following business day!  If a patient is unsure of the name of the medication(s) please note and ask patient to call back when they are able to provide all info, do not send to responsible party until all information is available!

## 2020-02-22 NOTE — Progress Notes (Signed)
PATIENT: Spencer Mendoza DOB: Jun 18, 1961  REASON FOR VISIT: follow up HISTORY FROM: patient  HISTORY OF PRESENT ILLNESS: Today 02/23/20  Spencer Mendoza is a 59 year old male with history of episodic seizures associated with generalized tonic-clonic events, bipolar disorder, and migraine headache.  He is taking trazodone 200 mg at bedtime, Keppra 750 mg twice a day.  He received Botox therapy in November and March for migraines.  Botox has been very helpful for his headaches, did have 1 severe headache following his Botox injection in March, keeps Decadron taper on hand, took this with good benefit once in March.  Overall, headaches doing well.  He may have had a seizure 02/15/2020, he thinks that he did, is unclear of details, worked that day, was stressed out, came home, had preseizure feeling, laid down, unclear what happened next or does not want to elaborate.  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 Yesterday complained of chest pain, pain in his left arm, shortness of breath, vomiting.  Denies any EtOH, drug use.  Is smoking half pack a day.  In less than 2 months, has lost about 15 pounds unintentionally.  He presents today accompanied by his fiance [name].  HISTORY 08/25/2019 Dr. Jannifer Franklin: [name] is a 59 year old right-handed white male with a history of episodic seizures associated with generalized tonic-clonic events and occasional urinary incontinence.  The patient has had significant issues with alcohol abuse, he may drink up to 12 beers a day.  He has been in the hospital in June, and on 17 September with seizures, he was in the hospital on 11 August 2019 with gastroenteritis, a CT scan of the abdomen at that time showed a low-density area in the liver that was new, he will be evaluated for possible liver cancer in the near future.  The patient has a history of bipolar  disorder, he is not followed through psychiatry.  He reports a lot of problems with claustrophobia and GAD.  He is not sleeping well, he indicates that severe insomnia may worsen his seizure control.  He is on trazodone 100 mg at night but this is not helpful.  The patient continues to have headaches almost daily or every other day, he may have a severe headache at least once a week.  He has been on a multitude of medications in the past for migraine including propranolol, amitriptyline, Topamax, Depakote, Effexor, gabapentin, Seroquel, Prozac, and Aimovig.  None of these modalities have been effective in controlling his headache.  The patient returns for an evaluation.  REVIEW OF SYSTEMS: Out of a complete 14 system review of symptoms, the patient complains only of the following symptoms, and all other reviewed systems are negative.  Headache, seizure  ALLERGIES: Allergies  Allergen Reactions  . Sertraline Other (See Comments)    Erectile dysfunction  . Codeine Nausea And Vomiting  . Depakote [Divalproex Sodium] Other (See Comments)    Made patient shake  . Migranal [Dihydroergotamine] Nausea And Vomiting  . Topamax [Topiramate] Other (See Comments)    Made patient angry    HOME MEDICATIONS: Outpatient Medications Prior to Visit  Medication Sig Dispense Refill  . albuterol (VENTOLIN HFA) 108 (90 Base) MCG/ACT inhaler Inhale 2 puffs into the lungs every 4 (four) hours as needed for shortness of breath or wheezing. 18 g 1  . Botulinum Toxin Type A (BOTOX) 200 units SOLR Inject 1  vial as directed every 3 (three) months.    . budesonide-formoterol (SYMBICORT) 160-4.5 MCG/ACT inhaler Inhale 2 puffs into the lungs 2 (two) times daily. 1 Inhaler 12  . diphenhydrAMINE (BENADRYL) 25 MG tablet Take 1 tablet (25 mg total) by mouth 2 (two) times daily. 20 tablet 0  . folic acid (FOLVITE) 1 MG tablet Take 1 tablet (1 mg total) by mouth daily. 30 tablet 3  . levETIRAcetam (KEPPRA) 750 MG tablet Take 1  tablet (750 mg total) by mouth 2 (two) times daily. 180 tablet 1  . nicotine polacrilex (NICORETTE MINI) 4 MG lozenge Use one 4 times daily to stop smoking 100 tablet 4  . ondansetron (ZOFRAN ODT) 4 MG disintegrating tablet Take 1 tablet (4 mg total) by mouth every 8 (eight) hours as needed for nausea or vomiting. 12 tablet 0  . thiamine 100 MG tablet Take 1 tablet (100 mg total) by mouth daily. 30 tablet 3  . traZODone (DESYREL) 100 MG tablet Take 2 tablets (200 mg total) by mouth at bedtime. 60 tablet 11   No facility-administered medications prior to visit.    PAST MEDICAL HISTORY: Past Medical History:  Diagnosis Date  . Alcohol withdrawal ([place]) 07/29/2013  . Allergy   . Anxiety   . Benzodiazepine abuse ([place]) 08/30/2019  . Bipolar disorder ([place])   . Chronic insomnia 06/06/2015  . Chronic migraine without aura, with intractable migraine, so stated, with status migrainosus 08/25/2019  . Cocaine abuse ([place]) 05/22/2015  . Delirium tremens ([place]) 07/29/2013  . Depression   . Epilepsy ([place])   . Hemiplegic migraine with status migrainosus 06/06/2015  . Hypertension   . Migraine   . Opioid use disorder ([place]) 08/30/2019  . Stroke [facility])    patient denies  . Substance abuse ([place])     PAST SURGICAL HISTORY: Past Surgical History:  Procedure Laterality Date  . APPENDECTOMY    . KNEE ARTHROSCOPY Right   . NOSE SURGERY      FAMILY HISTORY: Family History  Problem Relation Age of Onset  . Heart disease Mother   . Stroke Mother   . Epilepsy Mother   . Heart disease Father   . Hypertension Father   . Melanoma Father   . Migraines Father   . Colon cancer Sister   . Colon cancer Cousin     SOCIAL HISTORY: Social History   Socioeconomic History  . Marital status: Divorced    Spouse name: Not on file  . Number of children: 0  . Years of education: GED  . Highest education level: Not on file  Occupational History  . Occupation: disabled  Tobacco Use  . Smoking status: Current  Every Day Smoker    Packs/day: 0.50    Years: 15.00    Pack years: 7.50    Types: Cigarettes  . Smokeless tobacco: Never Used  Substance and Sexual Activity  . Alcohol use: Yes    Alcohol/week: 6.0 standard drinks    Types: 6 Cans of beer per week    Comment: pt reports no ETOH and no drugs in 3 months.   . Drug use: Yes    Frequency: 3.0 times per week    Types: Marijuana    Comment: crack, heroin pt reports no ETOH and no drugs in 3 months.   . Sexual activity: Yes    Birth control/protection: Condom  Other Topics Concern  . Not on file  Social History Narrative   Pt lives with sister.   Patient drinks  2 cups of caffeine daily.   Patient is right handed.   Social Determinants of Health   Financial Resource Strain:   . Difficulty of Paying Living Expenses:   Food Insecurity:   . Worried About Charity fundraiser in the Last Year:   . Arboriculturist in the Last Year:   Transportation Needs:   . Film/video editor (Medical):   [name] Lack of Transportation (Non-Medical):   Physical Activity:   . Days of Exercise per Week:   . Minutes of Exercise per Session:   Stress:   . Feeling of Stress :   Social Connections:   . Frequency of Communication with Friends and Family:   . Frequency of Social Gatherings with Friends and Family:   . Attends Religious Services:   . Active Member of Clubs or Organizations:   . Attends Archivist Meetings:   [name] Marital Status:   Intimate Partner Violence:   . Fear of Current or Ex-Partner:   . Emotionally Abused:   [name] Physically Abused:   . Sexually Abused:       PHYSICAL EXAM  Vitals:   02/23/20 0725  BP: 119/67  Pulse: 61  Temp: (!) 97.2 F (36.2 C)  Weight: 155 lb (70.3 kg)  Height: 6' (1.829 m)   Body mass index is 21.02 kg/m.  Generalized: Well developed, in no acute distress   Neurological examination  Mentation: Alert oriented to time, place, history taking. Follows all commands speech and language  fluent Cranial nerve II-XII: Pupils were equal round reactive to light. Extraocular movements were full, visual field were full on  Head turning and shoulder shrug  were normal and symmetric. Motor: The motor testing reveals 5 over 5 strength of all 4 extremities. Good symmetric motor tone is noted throughout.  Sensory: Sensory testing is intact to soft touch on all 4 extremities. No evidence of extinction is noted.  Coordination: Cerebellar testing reveals good finger-nose-finger and heel-to-shin bilaterally.  Gait and station: Gait is normal. Tandem gait is unsteady. Romberg is negative, but some lean to there right Reflexes: Deep tendon reflexes are symmetric and normal bilaterally.   DIAGNOSTIC DATA (LABS, IMAGING, TESTING) - I reviewed patient records, labs, notes, testing and imaging myself where available.  Lab Results  Component Value Date   WBC 7.2 08/30/2019   HGB 14.3 08/30/2019   HCT 41.1 08/30/2019   MCV 91 08/30/2019   PLT 298 08/30/2019      Component Value Date/Time   NA 138 08/30/2019 1449   K 4.3 08/30/2019 1449   CL 101 08/30/2019 1449   CO2 23 08/30/2019 1449   GLUCOSE 78 08/30/2019 1449   GLUCOSE 130 (H) 08/11/2019 0742   BUN 14 08/30/2019 1449   CREATININE 1.17 08/30/2019 1449   CREATININE 0.99 03/13/2015 1004   CALCIUM 10.0 08/30/2019 1449   PROT 7.0 08/30/2019 1449   ALBUMIN 4.8 08/30/2019 1449   AST 21 08/30/2019 1449   ALT 16 08/30/2019 1449   ALKPHOS 86 08/30/2019 1449   BILITOT 0.4 08/30/2019 1449   GFRNONAA 67 08/30/2019 1449   GFRNONAA 85 03/13/2015 1004   GFRAA 78 08/30/2019 1449   GFRAA >89 03/13/2015 1004   Lab Results  Component Value Date   CHOL 199 02/01/2018   HDL 55 02/01/2018   LDLCALC 98 02/01/2018   TRIG 229 (H) 02/01/2018   CHOLHDL 3.6 02/01/2018   Lab Results  Component Value Date   HGBA1C 4.9 02/01/2018  No results found for: VITAMINB12 Lab Results  Component Value Date   TSH 2.592 02/01/2018   ASSESSMENT AND  PLAN 59 y.o. year old male  has a past medical history of Alcohol withdrawal ([place]) (07/29/2013), Allergy, Anxiety, Benzodiazepine abuse ([place]) (08/30/2019), Bipolar disorder ([place]), Chronic insomnia (06/06/2015), Chronic migraine without aura, with intractable migraine, so stated, with status migrainosus (08/25/2019), Cocaine abuse ([place]) (05/22/2015), Delirium tremens ([place]) (07/29/2013), Depression, Epilepsy ([place]), Hemiplegic migraine with status migrainosus (06/06/2015), Hypertension, Migraine, Opioid use disorder ([place]) (08/30/2019), Stroke ([place]), and Substance abuse ([place]). here with:  1.  History of seizures -Patient possibly had recent seizure 02/15/2020, remembers preseizure feeling, does not remember after that, does not want to elaborate on details, reports compliance with Keppra 750 mg BID -To be careful, no driving for 6 months, no operating heavy equipment for his job, we discussed this in detail for safety, he verbalized understanding -Increase Keppra 1000 mg twice a day -Call for recurrent seizure  2.  Alcohol abuse, marijuana abuse -Denies this of recent  3.  Chronic insomnia -Sleeping well on trazodone, continue 200 mg at bedtime  4.  Chronic intractable migraine -Excellent benefit with Botox, next injection is June 2021 -We will refill Decadron 2 mg, 3 day taper to keep on hand if needed  5.  Anxiety, bipolar disorder -reports well controlled  6. Unintentional weight loss -Since February, weight loss of 15 pounds, unintentionally -Chronic smoker, recommended he call his PCP for work-up, this is concerning  7. Report of chest pain, SOB, left arm pain yesterday -symptoms resolved, should go to the ER if this happens again or call 911 -Mention this to PCP   Follow-up in June for Botox, 6 months for office revisit  I spent 30 minutes of face-to-face and non-face-to-face time with patient.  This included previsit chart review, lab review, study review, order entry, electronic health  record documentation, patient education.  Butler Denmark, AGNP-C, DNP 02/23/2020, 7:36 AM [facility] [address], [place], [place] [address] [phone]

## 2020-02-23 ENCOUNTER — Other Ambulatory Visit: Payer: Self-pay

## 2020-02-23 ENCOUNTER — Encounter: Payer: Self-pay | Admitting: Neurology

## 2020-02-23 ENCOUNTER — Ambulatory Visit (INDEPENDENT_AMBULATORY_CARE_PROVIDER_SITE_OTHER): Payer: Medicare Other | Admitting: Neurology

## 2020-02-23 VITALS — BP 119/67 | HR 61 | Temp 97.2°F | Ht 72.0 in | Wt 155.0 lb

## 2020-02-23 DIAGNOSIS — F5104 Psychophysiologic insomnia: Secondary | ICD-10-CM | POA: Diagnosis not present

## 2020-02-23 DIAGNOSIS — G43711 Chronic migraine without aura, intractable, with status migrainosus: Secondary | ICD-10-CM

## 2020-02-23 DIAGNOSIS — R569 Unspecified convulsions: Secondary | ICD-10-CM

## 2020-02-23 MED ORDER — DEXAMETHASONE 2 MG PO TABS: ORAL_TABLET | ORAL | 0 refills | Status: DC

## 2020-02-23 MED ORDER — LEVETIRACETAM 1000 MG PO TABS: 1000.0000 mg | ORAL_TABLET | Freq: Two times a day (BID) | ORAL | 11 refills | Status: DC

## 2020-02-23 NOTE — Progress Notes (Signed)
I have read the note, and I agree with the clinical assessment and plan.  Roneka Gilpin K Filicia Scogin   

## 2020-02-23 NOTE — Patient Instructions (Signed)
Increase Keppra 1000 mg twice daily  No driving or operating heavy machinery until seizure free for 6 months  Please see Dr. Joya Gaskins for evaluation of weight loss 15 lbs in less than 2 months

## 2020-02-28 ENCOUNTER — Encounter (HOSPITAL_BASED_OUTPATIENT_CLINIC_OR_DEPARTMENT_OTHER): Payer: Self-pay | Admitting: Emergency Medicine

## 2020-02-28 ENCOUNTER — Other Ambulatory Visit: Payer: Self-pay

## 2020-02-28 ENCOUNTER — Emergency Department (HOSPITAL_BASED_OUTPATIENT_CLINIC_OR_DEPARTMENT_OTHER): Payer: Medicare Other

## 2020-02-28 ENCOUNTER — Telehealth: Payer: Self-pay | Admitting: Critical Care Medicine

## 2020-02-28 ENCOUNTER — Emergency Department (HOSPITAL_BASED_OUTPATIENT_CLINIC_OR_DEPARTMENT_OTHER)
Admission: EM | Admit: 2020-02-28 | Discharge: 2020-02-28 | Disposition: A | Discharge status: Home / Self Care | Payer: Medicare Other | Attending: Emergency Medicine | Admitting: Emergency Medicine

## 2020-02-28 DIAGNOSIS — R0789 Other chest pain: Secondary | ICD-10-CM | POA: Insufficient documentation

## 2020-02-28 DIAGNOSIS — Z79899 Other long term (current) drug therapy: Secondary | ICD-10-CM | POA: Diagnosis not present

## 2020-02-28 DIAGNOSIS — F17210 Nicotine dependence, cigarettes, uncomplicated: Secondary | ICD-10-CM | POA: Diagnosis not present

## 2020-02-28 DIAGNOSIS — J440 Chronic obstructive pulmonary disease with acute lower respiratory infection: Secondary | ICD-10-CM | POA: Insufficient documentation

## 2020-02-28 DIAGNOSIS — I10 Essential (primary) hypertension: Secondary | ICD-10-CM | POA: Diagnosis not present

## 2020-02-28 DIAGNOSIS — I700 Atherosclerosis of aorta: Secondary | ICD-10-CM

## 2020-02-28 DIAGNOSIS — Z720 Tobacco use: Secondary | ICD-10-CM

## 2020-02-28 DIAGNOSIS — I739 Peripheral vascular disease, unspecified: Secondary | ICD-10-CM | POA: Diagnosis not present

## 2020-02-28 DIAGNOSIS — I25118 Atherosclerotic heart disease of native coronary artery with other forms of angina pectoris: Secondary | ICD-10-CM

## 2020-02-28 LAB — CBC
HCT: 44 % (ref 39.0–52.0)
Hemoglobin: 15 g/dL (ref 13.0–17.0)
MCH: 31.6 pg (ref 26.0–34.0)
MCHC: 34.1 g/dL (ref 30.0–36.0)
MCV: 92.6 fL (ref 80.0–100.0)
Platelets: 270 10*3/uL (ref 150–400)
RBC: 4.75 MIL/uL (ref 4.22–5.81)
RDW: 12.7 % (ref 11.5–15.5)
WBC: 6.9 10*3/uL (ref 4.0–10.5)
nRBC: 0 % (ref 0.0–0.2)

## 2020-02-28 LAB — BASIC METABOLIC PANEL
Anion gap: 10 (ref 5–15)
BUN: 11 mg/dL (ref 6–20)
CO2: 27 mmol/L (ref 22–32)
Calcium: 10 mg/dL (ref 8.9–10.3)
Chloride: 102 mmol/L (ref 98–111)
Creatinine, Ser: 1.02 mg/dL (ref 0.61–1.24)
GFR calc Af Amer: >60 mL/min (ref >60)
GFR calc non Af Amer: >60 mL/min (ref >60)
Glucose, Bld: 64 mg/dL — ABNORMAL LOW (ref 70–99)
Potassium: 4.2 mmol/L (ref 3.5–5.1)
Sodium: 139 mmol/L (ref 135–145)

## 2020-02-28 LAB — D-DIMER, QUANTITATIVE: D-Dimer, Quant: 0.38 ug/mL-FEU (ref 0.00–0.50)

## 2020-02-28 LAB — TROPONIN I (HIGH SENSITIVITY)
Troponin I (High Sensitivity): 3 ng/L (ref <18)
Troponin I (High Sensitivity): 4 ng/L (ref <18)

## 2020-02-28 LAB — CBG MONITORING, ED: Glucose-Capillary: 83 mg/dL (ref 70–99)

## 2020-02-28 MED ORDER — IOHEXOL 350 MG/ML SOLN
100.0000 mL | Freq: Once | INTRAVENOUS | Status: AC | PRN
  Administered 2020-02-28: 100 mL via INTRAVENOUS

## 2020-02-28 MED ORDER — MORPHINE SULFATE (PF) 2 MG/ML IV SOLN
2.0000 mg | Freq: Once | INTRAVENOUS | Status: AC
  Administered 2020-02-28: 2 mg via INTRAVENOUS
  Filled 2020-02-28: qty 1

## 2020-02-28 MED ORDER — ASPIRIN 81 MG PO CHEW
324.0000 mg | CHEWABLE_TABLET | Freq: Once | ORAL | Status: AC
  Administered 2020-02-28: 324 mg via ORAL
  Filled 2020-02-28: qty 4

## 2020-02-28 NOTE — Discharge Instructions (Signed)

## 2020-02-28 NOTE — Telephone Encounter (Signed)
Patient called saying that his urologist said he needs a referral to see the cardiologist. Please f/u with pt.

## 2020-02-28 NOTE — ED Triage Notes (Signed)
Pt c/o mid CP, back pain and RUE pain x 1 week; reports SHOB as well

## 2020-02-28 NOTE — ED Provider Notes (Signed)
[facility] EMERGENCY DEPARTMENT Provider Note   CSN: [id number] Arrival date & time: 02/28/20  [id number]     History Chief Complaint  Patient presents with  . Chest Pain    Spencer Mendoza is a 59 y.o. male  has a past medical history of alcohol use disorder, bipolar disorder, Chronic migraine without aura, with intractable migraine, so stated, with status migrainosus, history of polysubstance abuse in remission, depression, Epilepsy on Keppra and followed by neurology hemiplegic migraine, Hypertension, Stroke. He Presents with c/o cp. 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 Came on within 30 minutes and reached peak intensity. Pain was severe. He felt as though he pould lose consciousness and couldn't stand. The patient describes The pain as sharp and severe, radiating into both arms a R>L + numbnes in the left arm.  The patient had multiple episodes of vomiting that night.  He states that he also had dry heaves.  He tried taking hot showers which would only give him a few moments of relief.  He is unable to sleep that night.  Pain is still present but not as intense.  He has an associated cough without production he denies known fevers. He feels associated SOB at rest and with exertion and severe fatigue. He is a current daily smoker. He has a hx of Pneumonia and states that his sxs  Feel similar to previous episodes of pneumonia.   HPI     Past Medical History:  Diagnosis Date  . Alcohol withdrawal ([place]) 07/29/2013  . Allergy   . Anxiety   . Benzodiazepine abuse ([place]) 08/30/2019  . Bipolar disorder ([place])   . Chronic insomnia 06/06/2015  . Chronic migraine without aura, with intractable migraine, so stated, with status migrainosus 08/25/2019  . Cocaine abuse ([place]) 05/22/2015  . Delirium tremens ([place]) 07/29/2013  . Depression   . Epilepsy ([place])   . Hemiplegic migraine with status migrainosus 06/06/2015  . Hypertension   . Migraine   . Opioid use disorder ([place])  08/30/2019  . Stroke [facility])    patient denies  . Substance abuse [facility])     Patient Active Problem List   Diagnosis Date Noted  . Osteoarthritis of left wrist 01/04/2020  . Liver hemangioma 09/21/2019  . Tobacco use 08/30/2019  . COPD with chronic bronchitis ([place]) 08/30/2019  . Chronic migraine without aura, with intractable migraine, so stated, with status migrainosus 08/25/2019  . MDD (major depressive disorder), recurrent severe, without psychosis ([place]) 11/18/2018  . Substance induced mood disorder ([place]) 01/30/2018  . Hemiplegic migraine with status migrainosus 06/06/2015  . Chronic insomnia 06/06/2015  . Alcohol dependence with uncomplicated withdrawal ([place])   . Seizures ([place]) 01/09/2014  . Chronic low back pain 12/28/2013  . History of stroke 12/04/2013  . Hypertension 12/04/2013    Past Surgical History:  Procedure Laterality Date  . APPENDECTOMY    . KNEE ARTHROSCOPY Right   . NOSE SURGERY         Family History  Problem Relation Age of Onset  . Heart disease Mother   . Stroke Mother   . Epilepsy Mother   . Heart disease Father   . Hypertension Father   . Melanoma Father   . Migraines Father   . Colon cancer Sister   . Colon cancer Cousin     Social History   Tobacco Use  . Smoking status: Current Every Day Smoker    Packs/day: 0.50    Years: 15.00    Pack years:  7.50    Types: Cigarettes  . Smokeless tobacco: Never Used  Substance Use Topics  . Alcohol use: Yes    Alcohol/week: 6.0 standard drinks    Types: 6 Cans of beer per week    Comment: none since Sept 2020  . Drug use: Yes    Types: Marijuana    Comment: hx of crack, heroin; only THC since Sept 2020    Home Medications Prior to Admission medications   Medication Sig Start Date End Date Taking? Authorizing Provider  albuterol (VENTOLIN HFA) 108 (90 Base) MCG/ACT inhaler Inhale 2 puffs into the lungs every 4 (four) hours as needed for shortness of breath or wheezing. 02/20/20   Elsie Stain, MD  Botulinum Toxin Type A (BOTOX) 200 units SOLR Inject 1 vial as directed every 3 (three) months.    [provider]  budesonide-formoterol (SYMBICORT) 160-4.5 MCG/ACT inhaler Inhale 2 puffs into the lungs 2 (two) times daily. 08/30/19 08/29/20  Elsie Stain, MD  dexamethasone (DECADRON) 2 MG tablet Keep on hand if needed for acute headache, take 3, then take 2, then take 1 02/23/20   Suzzanne Cloud, NP  diphenhydrAMINE (BENADRYL) 25 MG tablet Take 1 tablet (25 mg total) by mouth 2 (two) times daily. 08/11/19   Nuala Alpha A, PA-C  folic acid (FOLVITE) 1 MG tablet Take 1 tablet (1 mg total) by mouth daily. 12/27/19   Elsie Stain, MD  levETIRAcetam (KEPPRA) 1000 MG tablet Take 1 tablet (1,000 mg total) by mouth 2 (two) times daily. 02/23/20   Suzzanne Cloud, NP  nicotine polacrilex (NICORETTE MINI) 4 MG lozenge Use one 4 times daily to stop smoking 08/30/19   Elsie Stain, MD  ondansetron (ZOFRAN ODT) 4 MG disintegrating tablet Take 1 tablet (4 mg total) by mouth every 8 (eight) hours as needed for nausea or vomiting. 01/18/20   Elsie Stain, MD  thiamine 100 MG tablet Take 1 tablet (100 mg total) by mouth daily. 12/27/19   Elsie Stain, MD  traZODone (DESYREL) 100 MG tablet Take 2 tablets (200 mg total) by mouth at bedtime. 12/27/19   Kathrynn Ducking, MD    Allergies    Sertraline, Codeine, Depakote [divalproex sodium], Migranal [dihydroergotamine], and Topamax [topiramate]  Review of Systems   Review of Systems Ten systems reviewed and are negative for acute change, except as noted in the HPI.   Physical Exam Updated Vital Signs BP (!) 148/91   Pulse (!) 54   Temp 97.6 F (36.4 C) (Oral)   Resp (!) 25   Ht 6' (1.829 m)   Wt 69.9 kg   SpO2 100%   BMI 20.89 kg/m   Physical Exam Vitals and nursing note reviewed.  Constitutional:      General: He is not in acute distress.    Appearance: He is well-developed. He is not diaphoretic.      Comments: Smells strongly of cigarette smoke  HENT:     Head: Normocephalic and atraumatic.  Eyes:     General: No scleral icterus.    Conjunctiva/sclera: Conjunctivae normal.  Cardiovascular:     Rate and Rhythm: Normal rate and regular rhythm.     Heart sounds: Normal heart sounds.  Pulmonary:     Effort: Pulmonary effort is normal. Tachypnea present. No respiratory distress.     Breath sounds: Rhonchi (clear with cough) present. No decreased breath sounds.  Chest:     Comments: No chest wall tenderness no thoracic  back tenderness Abdominal:     Palpations: Abdomen is soft.     Tenderness: There is no abdominal tenderness.  Musculoskeletal:     Cervical back: Normal range of motion and neck supple.     Right lower leg: No edema.     Left lower leg: No edema.  Skin:    General: Skin is warm and dry.  Neurological:     Mental Status: He is alert.  Psychiatric:        Behavior: Behavior normal.     ED Results / Procedures / Treatments   Labs (all labs ordered are listed, but only abnormal results are displayed) Labs Reviewed - No data to display  EKG EKG Interpretation  Date/Time:  Tuesday February 28 2020 09:18:19 EDT Ventricular Rate:  53 PR Interval:    QRS Duration: 109 QT Interval:  418 QTC Calculation: 393 R Axis:   99 Text Interpretation: Sinus rhythm Right axis deviation Probable anteroseptal infarct, old No significant change since last tracing Confirmed by Blanchie Dessert [phone]) on 02/28/2020 9:27:59 AM   Radiology No results found.  Procedures Procedures (including critical care time)  Medications Ordered in ED Medications - No data to display  ED Course  I have reviewed the triage vital signs and the nursing notes.  Pertinent labs & imaging results that were available during my care of the patient were reviewed by me and considered in my medical decision making (see chart for details).  Clinical Course as of Feb 27 1799  Tue Feb 28, 2020  1001  Patient has an Aortic Dissection Detection Risk score of 1-  I have ordered a D- Dimer. PE, acs, Pneumonia, Lung mass, PTX, Boerhaave's, mediastinitis, msk pain also in the differential.   [AH]  1103 Although d-dimer is normal will proceed with CTA for dissection    [AH]  1105 I have ordered PO soda  Glucose(!): 64 [AH]  1106 RLL opacities - CAP vs Atelectesis  DG Chest Port 1 View [AH]    Clinical Course User Index [AH] Margarita Mail, PA-C   MDM Rules/Calculators/A&P                      CC:cp VS: BP 134/79   Pulse (!) 54   Temp 97.6 F (36.4 C) (Oral)   Resp 19   Ht 6' (1.829 m)   Wt 69.9 kg   SpO2 100%   BMI 20.89 kg/m  [id number] is gathered by patient  and his wife who is at bedside. Previous records obtained and reviewed. DDX:The patient's complaint of CP involves an extensive number of diagnostic and treatment options, and is a complaint that carries with it a high risk of complications, morbidity, and potential mortality. Given the large differential diagnosis, medical decision making is of high complexity. The emergent differential diagnosis of chest pain includes: Acute coronary syndrome, pericarditis, aortic dissection, pulmonary embolism, tension pneumothorax, pneumonia, and esophageal rupture.  Labs: I ordered reviewed and interpreted labs which include BMP which shows a slightly low blood glucose of 64 which corrected to 83 after oral fluid intake with juice or soda. Patient has had 2 - troponins. Patient's D-dimer 0.38 and within normal limits. CBC shows no elevated white blood cell count.   Imaging: I ordered and reviewed images which included chest x-ray and CT angiogram of the chest abdomen and pelvis. I independently visualized and interpreted all imaging. Significant findings include 75% stenosis of the inferior mesenteric artery, 50% by bilateral iliac artery  stenosis and 50% femoral artery stenosis. Chest x-ray shows some opacities which are not evident on  the CT scanning..  EKG: Sinus rhythm at a rate of 109 without ischemic changes Consults: N/A MDM: Patient here with severe chest pain onset 6 days ago. At high concern for potential stable thoracic aortic dissection. Patient had equal bilateral pulses in the upper and lower extremities. The patient does not have evidence of dissection but does have evidence of lung disease including emphysema and peripheral vascular disease particularly to the mesenteric artery. I discussed the findings with the patient and had a long discussion about the need for him to optimize the health of his arteries and body by quitting smoking. I have offered printable resources and asked the patient to follow closely with his primary care physician. The patient's pain has significantly improved since he has been here. I do not find an emergent cause of the patient's pain but do feel he needs close PCP follow-up.  The patient was counseled on the dangers of tobacco use, and was advised to quit. Reviewed strategies to maximize success, including removing cigarettes and smoking materials from environment, stress management, substitution of other forms of reinforcement, support of family/friends and written materials.  Patient disposition: Discharge Patient case discussed with Dr. Maryan Rued who agrees with work-up and plan for discharge at this time The patient appears reasonably screened and/or stabilized for discharge and I doubt any other medical condition or other [facility] requiring further screening, evaluation, or treatment in the ED at this time prior to discharge. I have discussed lab and/or imaging findings with the patient and answered all questions/concerns to the best of my ability.I have discussed return precautions and OP follow up.    Final Clinical Impression(s) / ED Diagnoses Final diagnoses:  None    Rx / DC Orders ED Discharge Orders    None       Margarita Mail, PA-C 02/28/20 Newt Minion,  MD 03/01/20 2137

## 2020-02-28 NOTE — ED Notes (Signed)
Sprite provided to pt per EDP request for low blood sugar.

## 2020-03-05 DIAGNOSIS — I2510 Atherosclerotic heart disease of native coronary artery without angina pectoris: Secondary | ICD-10-CM | POA: Insufficient documentation

## 2020-03-05 DIAGNOSIS — I700 Atherosclerosis of aorta: Secondary | ICD-10-CM | POA: Insufficient documentation

## 2020-03-05 NOTE — Telephone Encounter (Signed)
Pt came in upset that the referral for the heart dr hasn't been sent was hitting the counter and yelling at staff I explained to him that the provider who he see was not in the office and that we could see if someone different could send it he hit the counter to yell.

## 2020-03-05 NOTE — Telephone Encounter (Signed)
Pt in ED recently and has significant aortic stenosis in distal aorta and mesenteric blockage and coronary symptoms  Will make urgent referrals to cardiology and vascular surgery

## 2020-03-06 NOTE — Telephone Encounter (Signed)
Patient have an appointment with  Cardiology on 03-07-20 @ 3:20PM  Vascular no yet

## 2020-03-07 ENCOUNTER — Other Ambulatory Visit: Payer: Self-pay

## 2020-03-07 ENCOUNTER — Encounter: Payer: Self-pay | Admitting: Cardiology

## 2020-03-07 ENCOUNTER — Ambulatory Visit (INDEPENDENT_AMBULATORY_CARE_PROVIDER_SITE_OTHER): Payer: Medicare Other | Admitting: Cardiology

## 2020-03-07 ENCOUNTER — Telehealth: Payer: Self-pay | Admitting: Radiology

## 2020-03-07 VITALS — BP 124/76 | HR 61 | Ht 72.0 in | Wt 157.2 lb

## 2020-03-07 DIAGNOSIS — R55 Syncope and collapse: Secondary | ICD-10-CM

## 2020-03-07 DIAGNOSIS — I25118 Atherosclerotic heart disease of native coronary artery with other forms of angina pectoris: Secondary | ICD-10-CM

## 2020-03-07 DIAGNOSIS — R072 Precordial pain: Secondary | ICD-10-CM | POA: Diagnosis not present

## 2020-03-07 NOTE — Progress Notes (Signed)
Cardiology Office Note:    Date:  03/07/2020   ID:  Spencer Mendoza, DOB 30-Jul-1961, MRN [id number]  PCP:  Spencer Stain, MD  Cardiologist:  No primary care provider on file.  Electrophysiologist:  None   Referring MD: Spencer Stain, MD     History of Present Illness:    Spencer Mendoza is a 59 y.o. male here for the evaluation of atypical chest pain at the request of Dr. Maryan Mendoza in ER.  He was in the emergency department on 02/28/2020.  Has history of bipolar disorder alcohol use and chronic migraine, epilepsy on Keppra.  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  Severe.  Thought he would lose consciousness and could not stand.  Radiates to both arms right greater than left numbness in the left arm.  Also had multiple episodes of vomiting that night.  Hot showers would only give him a few moments of relief.  Could not sleep.  Smokes.  He had a CT scan performed of the chest abdomen pelvis which showed 75% stenosis of inferior mesenteric artery and 50% bilateral iliac artery stenosis as well as 50% femoral artery stenosis.  EKG at that time showed sinus tachycardia 109 without any ischemic changes.  No dissection but has clear emphysema and peripheral vascular disease.  Today he feels rundown.  He was telling me how after he got home from the ER he has had subsequent episode of left-sided neck pain, neck tightness that resulted in him blacking out.  His significant other witnessed.  He looked pale, sweaty.  Felt like he was going to throw up.  Vagal-like I explained to him.  He is also had episodes of ongoing chest discomfort.  He is very anxious and nervous that something is terribly wrong.  CT scan of chest abdomen pelvis was done and does demonstrate aortic atherosclerosis as well as LAD calcified lesion.  No anemia, normal electrolytes, normal serum sodium, normal creatinine previously.  TSH over a year ago was normal.  I am repeating.  Past Medical  History:  Diagnosis Date  . Alcohol withdrawal ([place]) 07/29/2013  . Allergy   . Anxiety   . Benzodiazepine abuse ([place]) 08/30/2019  . Bipolar disorder ([place])   . Chronic insomnia 06/06/2015  . Chronic migraine without aura, with intractable migraine, so stated, with status migrainosus 08/25/2019  . Cocaine abuse ([place]) 05/22/2015  . Delirium tremens ([place]) 07/29/2013  . Depression   . Epilepsy ([place])   . Hemiplegic migraine with status migrainosus 06/06/2015  . Hypertension   . Migraine   . Opioid use disorder ([place]) 08/30/2019  . Stroke [facility])    patient denies  . Substance abuse [facility])     Past Surgical History:  Procedure Laterality Date  . APPENDECTOMY    . KNEE ARTHROSCOPY Right   . NOSE SURGERY      Current Medications: Current Meds  Medication Sig  . albuterol (VENTOLIN HFA) 108 (90 Base) MCG/ACT inhaler Inhale 2 puffs into the lungs every 4 (four) hours as needed for shortness of breath or wheezing.  . Botulinum Toxin Type A (BOTOX) 200 units SOLR Inject 1 vial as directed every 3 (three) months.  . budesonide-formoterol (SYMBICORT) 160-4.5 MCG/ACT inhaler Inhale 2 puffs into the lungs 2 (two) times daily.  [name] dexamethasone (DECADRON) 2 MG tablet Keep on hand if needed for acute headache, take 3, then take 2, then take 1  . diphenhydrAMINE (BENADRYL) 25 MG tablet Take 1 tablet (25 mg  total) by mouth 2 (two) times daily.  . folic acid (FOLVITE) 1 MG tablet Take 1 tablet (1 mg total) by mouth daily.  [name] levETIRAcetam (KEPPRA) 1000 MG tablet Take 1 tablet (1,000 mg total) by mouth 2 (two) times daily.  . ondansetron (ZOFRAN ODT) 4 MG disintegrating tablet Take 1 tablet (4 mg total) by mouth every 8 (eight) hours as needed for nausea or vomiting.  . thiamine 100 MG tablet Take 1 tablet (100 mg total) by mouth daily.  . traZODone (DESYREL) 100 MG tablet Take 2 tablets (200 mg total) by mouth at bedtime.     Allergies:   Sertraline, Codeine, Depakote [divalproex sodium], Migranal  [dihydroergotamine], and Topamax [topiramate]   Social History   Socioeconomic History  . Marital status: Divorced    Spouse name: Not on file  . Number of children: 0  . Years of education: GED  . Highest education level: Not on file  Occupational History  . Occupation: disabled  Tobacco Use  . Smoking status: Current Every Day Smoker    Packs/day: 0.50    Years: 15.00    Pack years: 7.50    Types: Cigarettes  . Smokeless tobacco: Never Used  Substance and Sexual Activity  . Alcohol use: Yes    Alcohol/week: 6.0 standard drinks    Types: 6 Cans of beer per week    Comment: none since Sept 2020  . Drug use: Yes    Types: Marijuana    Comment: hx of crack, heroin; only THC since Sept 2020  . Sexual activity: Yes    Birth control/protection: Condom  Other Topics Concern  . Not on file  Social History Narrative   Pt lives with sister.   Patient drinks 2 cups of caffeine daily.   Patient is right handed.   Social Determinants of Health   Financial Resource Strain:   . Difficulty of Paying Living Expenses:   Food Insecurity:   . Worried About Charity fundraiser in the Last Year:   . Arboriculturist in the Last Year:   Transportation Needs:   . Film/video editor (Medical):   [name] Lack of Transportation (Non-Medical):   Physical Activity:   . Days of Exercise per Week:   . Minutes of Exercise per Session:   Stress:   . Feeling of Stress :   Social Connections:   . Frequency of Communication with Friends and Family:   . Frequency of Social Gatherings with Friends and Family:   . Attends Religious Services:   . Active Member of Clubs or Organizations:   . Attends Archivist Meetings:   [name] Marital Status:      Family History: The patient's family history includes Colon cancer in his cousin and sister; Epilepsy in his mother; Heart disease in his father and mother; Hypertension in his father; Melanoma in his father; Migraines in his father; Stroke in his  mother.  ROS:   Please see the history of present illness.    Multiple somatic pains.  Weight loss.  Malaise.  Fatigue.  All other systems reviewed and are negative.  EKGs/Labs/Other Studies Reviewed:    The following studies were reviewed today: CT scan personally reviewed and interpreted with him showed him images.  Coronary calcification noted.  EKG: From 02/28/2020 shows heart rate 53 bpm bradycardic with poor R wave progression  Recent Labs: 08/30/2019: ALT 16 02/28/2020: BUN 11; Creatinine, Ser 1.02; Hemoglobin 15.0; Platelets 270; Potassium 4.2; Sodium 139  Recent Lipid Panel    Component Value Date/Time   CHOL 199 02/01/2018 0617   TRIG 229 (H) 02/01/2018 0617   HDL 55 02/01/2018 0617   CHOLHDL 3.6 02/01/2018 0617   VLDL 46 (H) 02/01/2018 0617   LDLCALC 98 02/01/2018 0617    Physical Exam:    VS:  BP 124/76   Pulse 61   Ht 6' (1.829 m)   Wt 157 lb 3.2 oz (71.3 kg)   SpO2 97%   BMI 21.32 kg/m     Wt Readings from Last 3 Encounters:  03/07/20 157 lb 3.2 oz (71.3 kg)  02/28/20 154 lb (69.9 kg)  02/23/20 155 lb (70.3 kg)     GEN:  Well nourished, well developed in no acute distress HEENT: Normal NECK: No JVD; No carotid bruits LYMPHATICS: No lymphadenopathy CARDIAC: RRR, no murmurs, rubs, gallops RESPIRATORY:  Clear to auscultation without rales, wheezing or rhonchi, somewhat distant breath sounds ABDOMEN: Soft, non-tender, non-distended MUSCULOSKELETAL:  No edema; No deformity  SKIN: Warm and dry, multiple tattoos NEUROLOGIC:  Alert and oriented x 3 PSYCHIATRIC:  Normal affect   ASSESSMENT:    1. Coronary artery disease of native artery of native heart with stable angina pectoris ([place])   2. Precordial pain   3. Syncope, unspecified syncope type    PLAN:    In order of problems listed above:  Generalized malaise Chest pain/abnormal EKG-poor R wave progression Syncope Peripheral arterial disease Aortic atherosclerosis Tobacco use  -We will  check an echocardiogram to ensure proper structure and function of his heart -I will check a Zio patch monitor to make sure that there were no dangerous or adverse arrhythmias present. -I will repeat a TSH today given his generalized malaise.  No signs of anemia.  Electrolytes appear normal, does not seem to have lab evidence of adrenal insufficiency. -Explained to him vagal phenomenon.  Feels washed out at times.  Hydrate well, salt liberalization -Troponin was normal, no signs of current MI. -Ultimately given his peripheral arterial disease noted with aortic atherosclerosis, he would benefit from statin therapy however given the way he feels currently, I am hesitant to start any new medications.  We will follow-up with results of testing.  Candee Furbish, MD  -Continue to promote tobacco cessation.   Medication Adjustments/Labs and Tests Ordered: Current medicines are reviewed at length with the patient today.  Concerns regarding medicines are outlined above.  Orders Placed This Encounter  Procedures  . CT CORONARY MORPH W/CTA COR W/SCORE W/CA W/CM &/OR WO/CM  . CT CORONARY FRACTIONAL FLOW RESERVE DATA PREP  . CT CORONARY FRACTIONAL FLOW RESERVE FLUID ANALYSIS  . TSH  . LONG TERM MONITOR (3-14 DAYS)  . ECHOCARDIOGRAM COMPLETE   No orders of the defined types were placed in this encounter.   Patient Instructions  Medication Instructions:   Your physician recommends that you continue on your current medications as directed. Please refer to the Current Medication list given to you today.  *If you need a refill on your cardiac medications before your next appointment, please call your pharmacy*  Lab Work:  TODAY--TSH LEVEL  If you have labs (blood work) drawn today and your tests are completely normal, you will receive your results only by: [name] MyChart Message (if you have MyChart) OR . A paper copy in the mail If you have any lab test that is abnormal or we need to change your  treatment, we will call you to review the results.   Testing/Procedures:  Your physician has requested that you have an echocardiogram. Echocardiography is a painless test that uses sound waves to create images of your heart. It provides your doctor with information about the size and shape of your heart and how well your heart's chambers and valves are working. This procedure takes approximately one hour. There are no restrictions for this procedure.  Your cardiac CT will be scheduled at one of the below locations:   [facility] [address] [place], [place] [address] [phone]   If scheduled at [facility], please arrive at the [facility] main entrance of [facility] 30 minutes prior to test start time. Proceed to the [facility] Radiology Department (first floor) to check-in and test prep.  Please follow these instructions carefully (unless otherwise directed):  On the Night Before the Test: . Be sure to Drink plenty of water. . Do not consume any caffeinated/decaffeinated beverages or chocolate 12 hours prior to your test. . Do not take any antihistamines 12 hours prior to your test.  On the Day of the Test: . Drink plenty of water. Do not drink any water within one hour of the test. . Do not eat any food 4 hours prior to the test. . You may take your regular medications prior to the test.        After the Test: . Drink plenty of water. . After receiving IV contrast, you may experience a mild flushed feeling. This is normal. . On occasion, you may experience a mild rash up to 24 hours after the test. This is not dangerous. If this occurs, you can take Benadryl 25 mg and increase your fluid intake. . If you experience trouble breathing, this can be serious. If it is severe call 911 IMMEDIATELY. If it is mild, please call our office.  Once we have confirmed authorization from your insurance company, we will call you to set up a date and time  for your test.   For non-scheduling related questions, please contact the cardiac imaging nurse navigator should you have any questions/concerns: Marchia Bond, RN Navigator Cardiac Imaging [facility] [phone] office For scheduling needs, including cancellations and rescheduling, please call [phone].    ZIO XT- Long Term Monitor Instructions   Your physician has requested you wear your ZIO patch monitor_14______days.   This is a single patch monitor.  [employer] supplies one patch monitor per enrollment.  Additional stickers are not available.   Please do not apply patch if you will be having a Nuclear Stress Test, Echocardiogram, Cardiac CT, MRI, or Chest Xray during the time frame you would be wearing the monitor. The patch cannot be worn during these tests.  You cannot remove and re-apply the ZIO XT patch monitor.   Your ZIO patch monitor will be sent USPS Priority mail from [facility] directly to your home address. The monitor may also be mailed to a PO BOX if home delivery is not available.   It may take 3-5 days to receive your monitor after you have been enrolled.   Once you have received you monitor, please review enclosed instructions.  Your monitor has already been registered assigning a specific monitor serial # to you.   Applying the monitor   Shave hair from upper left chest.   Hold abrader disc by orange tab.  Rub abrader in 40 strokes over left upper chest as indicated in your monitor instructions.   Clean area with 4 enclosed  alcohol pads .  Use all pads to assure are is cleaned thoroughly.  Let dry.   Apply patch as indicated in monitor instructions.  Patch will be place under collarbone on left side of chest with arrow pointing upward.   Rub patch adhesive wings for 2 minutes.Remove white label marked "1".  Remove white label marked "2".  Rub patch adhesive wings for 2 additional minutes.   While looking in a mirror, press  and release button in center of patch.  A small green light will flash 3-4 times .  This will be your only indicator the monitor has been turned on.     Do not shower for the first 24 hours.  You may shower after the first 24 hours.   Press button if you feel a symptom. You will hear a small click.  Record Date, Time and Symptom in the Patient Log Book.   When you are ready to remove patch, follow instructions on last 2 pages of Patient Log Book.  Stick patch monitor onto last page of Patient Log Book.   Place Patient Log Book in [place] box.  Use locking tab on box and tape box closed securely.  The [employer] has [employer] on it.  Please place in mailbox as soon as possible.  Your physician should have your test results approximately 7 days after the monitor has been mailed back to [facility].   Call [place] at [phone] if you have questions regarding your ZIO XT patch monitor.  Call them immediately if you see an orange light blinking on your monitor.   If your monitor falls off in less than 4 days contact our Monitor department at [phone].  If your monitor becomes loose or falls off after 4 days call [employer] at [phone] for suggestions on securing your monitor.   Follow-Up:  AS NEEDED WITH DR. Marlou Porch     Signed, Candee Furbish, MD  03/07/2020 5:00 PM    [place]

## 2020-03-07 NOTE — Patient Instructions (Signed)
Medication Instructions:   Your physician recommends that you continue on your current medications as directed. Please refer to the Current Medication list given to you today.  *If you need a refill on your cardiac medications before your next appointment, please call your pharmacy*  Lab Work:  TODAY--TSH LEVEL  If you have labs (blood work) drawn today and your tests are completely normal, you will receive your results only by: [name] MyChart Message (if you have MyChart) OR . A paper copy in the mail If you have any lab test that is abnormal or we need to change your treatment, we will call you to review the results.   Testing/Procedures:  Your physician has requested that you have an echocardiogram. Echocardiography is a painless test that uses sound waves to create images of your heart. It provides your doctor with information about the size and shape of your heart and how well your heart's chambers and valves are working. This procedure takes approximately one hour. There are no restrictions for this procedure.  Your cardiac CT will be scheduled at one of the below locations:   [facility] [address] [place], [place] [address] [phone]   If scheduled at [facility], please arrive at the [facility] main entrance of [facility] 30 minutes prior to test start time. Proceed to the [facility] Radiology Department (first floor) to check-in and test prep.  Please follow these instructions carefully (unless otherwise directed):  On the Night Before the Test: . Be sure to Drink plenty of water. . Do not consume any caffeinated/decaffeinated beverages or chocolate 12 hours prior to your test. . Do not take any antihistamines 12 hours prior to your test.  On the Day of the Test: . Drink plenty of water. Do not drink any water within one hour of the test. . Do not eat any food 4 hours prior to the test. . You may take your regular medications prior  to the test.        After the Test: . Drink plenty of water. . After receiving IV contrast, you may experience a mild flushed feeling. This is normal. . On occasion, you may experience a mild rash up to 24 hours after the test. This is not dangerous. If this occurs, you can take Benadryl 25 mg and increase your fluid intake. . If you experience trouble breathing, this can be serious. If it is severe call 911 IMMEDIATELY. If it is mild, please call our office.  Once we have confirmed authorization from your insurance company, we will call you to set up a date and time for your test.   For non-scheduling related questions, please contact the cardiac imaging nurse navigator should you have any questions/concerns: Marchia Bond, RN Navigator Cardiac Imaging [facility] [phone] office For scheduling needs, including cancellations and rescheduling, please call [phone].    ZIO XT- Long Term Monitor Instructions   Your physician has requested you wear your ZIO patch monitor_14______days.   This is a single patch monitor.  Ir[employer] supplies one patch monitor per enrollment.  Additional stickers are not available.   Please do not apply patch if you will be having a Nuclear Stress Test, Echocardiogram, Cardiac CT, MRI, or Chest Xray during the time frame you would be wearing the monitor. The patch cannot be worn during these tests.  You cannot remove and re-apply the ZIO XT patch monitor.   Your ZIO patch monitor will  be sent USPS Priority mail from [facility] directly to your home address. The monitor may also be mailed to a PO BOX if home delivery is not available.   It may take 3-5 days to receive your monitor after you have been enrolled.   Once you have received you monitor, please review enclosed instructions.  Your monitor has already been registered assigning a specific monitor serial # to you.   Applying the monitor   Shave hair from upper  left chest.   Hold abrader disc by orange tab.  Rub abrader in 40 strokes over left upper chest as indicated in your monitor instructions.   Clean area with 4 enclosed alcohol pads .  Use all pads to assure are is cleaned thoroughly.  Let dry.   Apply patch as indicated in monitor instructions.  Patch will be place under collarbone on left side of chest with arrow pointing upward.   Rub patch adhesive wings for 2 minutes.Remove white label marked "1".  Remove white label marked "2".  Rub patch adhesive wings for 2 additional minutes.   While looking in a mirror, press and release button in center of patch.  A small green light will flash 3-4 times .  This will be your only indicator the monitor has been turned on.     Do not shower for the first 24 hours.  You may shower after the first 24 hours.   Press button if you feel a symptom. You will hear a small click.  Record Date, Time and Symptom in the Patient Log Book.   When you are ready to remove patch, follow instructions on last 2 pages of Patient Log Book.  Stick patch monitor onto last page of Patient Log Book.   Place Patient Log Book in [place] box.  Use locking tab on box and tape box closed securely.  The [employer] has [employer] on it.  Please place in mailbox as soon as possible.  Your physician should have your test results approximately 7 days after the monitor has been mailed back to [facility].   Call [place] at [phone] if you have questions regarding your ZIO XT patch monitor.  Call them immediately if you see an orange light blinking on your monitor.   If your monitor falls off in less than 4 days contact our Monitor department at [phone].  If your monitor becomes loose or falls off after 4 days call [employer] at [phone] for suggestions on securing your monitor.   Follow-Up:  AS NEEDED WITH DR. Marlou Porch

## 2020-03-07 NOTE — Telephone Encounter (Signed)
Enrolled patient for a 14 day Zio monitor to be mailed to patients home.  

## 2020-03-08 LAB — TSH: TSH: 3.31 u[IU]/mL (ref 0.450–4.500)

## 2020-03-12 NOTE — Telephone Encounter (Signed)
Patient instructed to hold off applying ZIO monitor until after his echocardiogram and CT.

## 2020-03-12 NOTE — Telephone Encounter (Signed)
Patient states he is calling to inquire about whether or not he needs to hold applying Zio monitor until he has CT and echocardiogram. Please advise.

## 2020-03-20 ENCOUNTER — Ambulatory Visit: Payer: Medicare Other | Attending: Critical Care Medicine | Admitting: Critical Care Medicine

## 2020-03-20 ENCOUNTER — Encounter: Payer: Self-pay | Admitting: Critical Care Medicine

## 2020-03-20 ENCOUNTER — Other Ambulatory Visit: Payer: Self-pay

## 2020-03-20 VITALS — BP 126/84 | HR 58 | Temp 98.5°F | Resp 16 | Ht 72.0 in | Wt 158.0 lb

## 2020-03-20 DIAGNOSIS — J449 Chronic obstructive pulmonary disease, unspecified: Secondary | ICD-10-CM

## 2020-03-20 DIAGNOSIS — I25118 Atherosclerotic heart disease of native coronary artery with other forms of angina pectoris: Secondary | ICD-10-CM

## 2020-03-20 DIAGNOSIS — I700 Atherosclerosis of aorta: Secondary | ICD-10-CM | POA: Diagnosis not present

## 2020-03-20 DIAGNOSIS — F5104 Psychophysiologic insomnia: Secondary | ICD-10-CM

## 2020-03-20 DIAGNOSIS — I10 Essential (primary) hypertension: Secondary | ICD-10-CM

## 2020-03-20 DIAGNOSIS — R569 Unspecified convulsions: Secondary | ICD-10-CM

## 2020-03-20 DIAGNOSIS — Z720 Tobacco use: Secondary | ICD-10-CM | POA: Diagnosis not present

## 2020-03-20 DIAGNOSIS — F332 Major depressive disorder, recurrent severe without psychotic features: Secondary | ICD-10-CM

## 2020-03-20 MED ORDER — BUPROPION HCL ER (SR) 150 MG PO TB12: 150.0000 mg | ORAL_TABLET | Freq: Two times a day (BID) | ORAL | 2 refills | Status: DC

## 2020-03-20 MED ORDER — BUDESONIDE-FORMOTEROL FUMARATE 160-4.5 MCG/ACT IN AERO: 2.0000 | INHALATION_SPRAY | Freq: Two times a day (BID) | RESPIRATORY_TRACT | 12 refills | Status: DC

## 2020-03-20 MED ORDER — ALBUTEROL SULFATE HFA 108 (90 BASE) MCG/ACT IN AERS: 2.0000 | INHALATION_SPRAY | RESPIRATORY_TRACT | 1 refills | Status: DC | PRN

## 2020-03-20 MED ORDER — THIAMINE HCL 100 MG PO TABS: 100.0000 mg | ORAL_TABLET | Freq: Every day | ORAL | 3 refills | Status: DC

## 2020-03-20 NOTE — Assessment & Plan Note (Signed)
Patient has abdominal aortic atherosclerosis with 50% occlusion of the aorta not a good candidate for statins due to muscular side effects of other medications  Plan will be to refer patient to vascular surgery for further evaluation

## 2020-03-20 NOTE — Assessment & Plan Note (Signed)
Coronary artery disease with calcification the coronaries plan is to obtain coronary CT scanning and also rhythm study per cardiology further recommendations will follow

## 2020-03-20 NOTE — Assessment & Plan Note (Signed)
Hypertension under good control at this time we will continue current medication profile

## 2020-03-20 NOTE — Assessment & Plan Note (Signed)
  .   Current smoking consumption amount: 1 pack a day  . Dicsussion on advise to quit smoking and smoking impacts: Ongoing impact on the patient's cardiovascular and lung health  . Patient's willingness to quit:   Patient wishes to quit at this time . Methods to quit smoking discussed: Best avenue to quit will be using bupropion 150 mg twice daily and avoid nicotine replacement  . Medication management of smoking session drugs discussed: Bupropion 150 mg twice daily prescribed  . Resources provided:  AVS   . Setting quit date quit date is arranged for 1 month  . Follow-up arranged return in 1 month   Time spent counseling the patient:

## 2020-03-20 NOTE — Assessment & Plan Note (Signed)
COPD with ongoing tobacco use we discussed strategies to reduce tobacco use and will prescribe bupropion 150 mg twice daily and continue inhaled medications

## 2020-03-20 NOTE — Patient Instructions (Signed)
Start wellbutrin one twice daily  No medication changes  Keep appointment with vascular surgery and appts for CT scans of heart  Return Dr Joya Gaskins 6 weeks

## 2020-03-20 NOTE — Progress Notes (Signed)
Subjective:    Patient ID: Spencer Mendoza, male    DOB: [birth year], 59 y.o.   MRN: [id number]  History of Present Illness: This is a 59 year old white male history of episodic seizures associated with tonic-clonic events and urinary incontinence.  History also of heavy ethanol use and severe generalized anxiety disorder with claustrophobia.  Patient has significant insomnia as well.  The patient has also chronic history of migraines been on multiple medications for this in the past as well.  Patient has a history of substance use including opioids in the past as well.  Patient also uses marijuana at this time.  Patient has history of bipolar disorder and severe depression.  Patient presents today post emergency room visit to establish for primary care.  The patient had been in this clinic previously 5 years ago and wishes to come back to the clinic now to establish.  There is also prior history of stroke.  Also history of nausea vomiting and diarrhea in the past.  The patient was seen in the emergency room on 24 September for similar complaints.  Below is the office visit with neurology on October 8  08/25/19 OV with Neurology Spencer Mendoza is a 59 year old right-handed white male with a history of episodic seizures associated with generalized tonic-clonic events and occasional urinary incontinence.  The patient has had significant issues with alcohol abuse, he may drink up to 12 beers a day.  He has been in the hospital in June, and on 17 September with seizures, he was in the hospital on 11 August 2019 with gastroenteritis, a CT scan of the abdomen at that time showed a low-density area in the liver that was new, he will be evaluated for possible liver cancer in the near future.  The patient has a history of bipolar disorder, he is not followed through psychiatry.  He reports a lot of problems with claustrophobia and GAD.  He is not sleeping well, he indicates that severe insomnia may worsen his  seizure control.  He is on trazodone 100 mg at night but this is not helpful.  The patient continues to have headaches almost daily or every other day, he may have a severe headache at least once a week.  He has been on a multitude of medications in the past for migraine including propranolol, amitriptyline, Topamax, Depakote, Effexor, gabapentin, Seroquel, Prozac, and Aimovig.  None of these modalities have been effective in controlling his headache.  The patient returns for an evaluation.   1.  History of seizures  2.  Alcohol abuse, marijuana abuse  3.  Chronic insomnia  4.  Chronic intractable migraine  5.  Anxiety, bipolar disorder  The patient will be increased on the trazodone to 150 mg at night, he will call for any dose adjustments.  He will be set up for Botox therapy.  The patient will go up on the Keppra dosing to 750 mg twice daily.  He will follow-up here in 6 months, likely be seen sooner for Botox therapy.  Below is the emergency room visit September 24 In ED 9/24: Spencer Mendoza is a 59 y.o. male with history of bipolar, epilepsy, hypertension, migraine, CVA, substance abuse, EtOH abuse presents today for abdominal pain nausea vomiting diarrhea and rash.  Patient reports that on Sunday evening he ate shrimp that he found in his refrigerator.  He reports that he was unaware of it at that time but shrimp had been in the refrigerator for greater than 12  days.  He reports that on Sunday night he developed abdominal pain and diarrhea.  He describes a generalized abdominal pain a severe cramping sensation constant without aggravating or alleviating symptoms or radiation.  Abdominal pain has been constant for the past 4 days.  Patient reports that on Monday morning he began taking a new medication prescribed by his neurologist, Lamictal for seizures in addition to his normal Keppra.  He reports that shortly after taking the Lamictal on Monday morning his diarrhea improved but  was followed by nausea and vomiting.  He describes multiple episodes of nonbloody/nonbilious emesis over the past 3 days with continued abdominal pain.  Patient reports that on Tuesday the day after he began taking Lamictal he developed a diffuse red rash to his torso that has remained constant since that time.  Reports feeling warm and cold but denies measured fever at home.  Denies headache/vision changes, neck pain, difficulty swallowing, feeling of throat closing, burning of the mouth, chest pain/shortness of breath, dysuria/hematuria, fall/injury or any additional concerns.   On initial evaluation patient is overall well-appearing and in no acute distress, not actively vomiting.  He has mild abdominal tenderness generalized without peritoneal signs.  He has a diffuse mild erythematous rash of the anterior torso.  He has no airway or mucosal involvement.  Discussed case with Dr. Roslynn Amble, discussed possible etiologies such as squamoid poisoning, feel this is unlikely based on history and physical examination but will give Benadryl today.  He did begin taking Lamictal just prior to onset of his rash, review of notes from [facility] neurology show that they recommended that he stop taking Lamictal today but continue with his Keppra. - Patient seen and evaluated by Dr. Roslynn Amble advises that if no acute findings on CT abdomen pelvis will discharge with Benadryl and outpatient follow-up. - CBC with extensions of 11.4 Lipase within normal limits CMP with anion gap 16, suspect secondary to dehydration Urinalysis with ketones and elevated specific gravity, suspect secondary to dehydration Ethanol negative, patient does not appear to be in alcohol withdrawal  CT abdomen pelvis:  IMPRESSION: No acute CT finding to account for abdominal pain.  There is a small hypodense/hypoenhancing lesion within segment 7 of the liver which was not present on the comparison noncontrast CT. The enhancement  characteristics are suspicious, and while this could represent a vascular lesion, malignancy (either primary or metastatic) cannot be excluded. Nonemergent follow-up contrast-enhanced liver protocol MRI is recommended. These results were discussed by telephone at the time of interpretation on 08/11/2019 at 5:21 pm with Dr. Nuala Alpha.  Aortic Atherosclerosis (ICD10-I70.0). - Discussed case with in-house pharmacy who advises that patient take Benadryl 25 mg twice daily x7 days for his rash and discontinue lamotrigine, follow-up for recheck early next week. - Patient reevaluated resting comfortably and in no acute distress.  On reevaluation abdomen is soft without peritoneal signs.  Patient has been updated on results today and he states understanding of need for follow-up.  He will be given referral to gastroenterologist for further evaluation encouraged to call office tomorrow morning.  He will be discharged with Zofran for nausea and vomiting.  Patient advised to stop taking lamotrigine immediately and to call his neurologist tomorrow morning to advise them of this.  He has Keppra at home that he will continue taking for his symptoms.  Patient does not appear to be having Stevens-Johnson syndrome at this time, he is overall well-appearing and in no acute distress.  He is advised to take Benadryl 25  mg twice daily x7 days and to follow-up with neurologist and PCP on Monday.  Note the patient does need a follow-up MRI of the liver because of the liver lesion seen which could represent a malignancy or metastatic lesion.  Contrast-enhanced liver protocol MRI was recommended.  The patient now has been sober for 3 weeks.  Is not drinking alcohol currently.  This patient had been previously in mental health programs for drug substance use and depression.  He has been on multiple depression medications in the past.  He has been on trazodone for sleep in the past.  He also has been on Prozac.  He also  used IV heroin in the past and also cocaine and Dilaudid in the past.  He does obtain off the street Xanax and uses this as needed at home.   10/03/2019 This patient is seen today in return follow-up from the mid October visit as documented above.  The patient currently is not drinking alcohol at this time.  He still suffers from significant depression and anxiety.  We tried sertraline on him 50 mg daily but he experienced erectile dysfunction and had to stop this medication.  He is continue the thiamine [id number] mg daily folic acid 1 mg daily.  The patient is currently not on treatment for hypertension as his blood pressures been well controlled.  Note this patient underwent an MRI of the liver to look for any abnormalities in the liver when an abdominal CT scan suggested potential liver cancer.  The MRI in fact showed a benign hepatic hemangioma and nothing else.  I went over with the patient this result previously on the phone today we went over this visit the results as well face-to-face.  The patient states he still has significant anxiety and depression and has not been able to achieve a psychiatry appointment as of yet.  He did meet with the licensed Education officer, museum. The patient is still smoking a pack a day of cigarettes.  He has not attempted to use nicotine to replace cigarettes as of yet.  He is maintaining his inhalers for COPD. Patient does complain of significant ongoing insomnia though he is on the trazodone 200 mg a day he gets him to sleep however he awakens in the middle the night with some choking sensations. The patient has completely stopped drinking alcohol  11/09/2019 This is a follow-up telephone visit for this patient who was last seen in November.  The patient has recently received Botox injections for his migraine syndrome and this is markedly improved his symptoms.  He maintains his seizure medications as well per neurology.  He is still smoking about half a pack a day of cigarettes.   He complains of increased cough increased sinus pressure increased sneezing low-grade fever dizziness.  He was Covid + November 22 and at that time had more gastrointestinal symptoms.  He had a girlfriend was positive as well. He has yet to achieve a psychiatry visit for his major depressive disorder.  His insurance did not approve the referrals we made previously.  Also the patient continues with his insomnia and hypersomnolence during the day and is yet to achieve a sleep study due to barriers from [employer] as well.  He notes thick yellow mucus coming out of the nose at this time. The patient is using the Symbicort inhaler as prescribed   11/30/2019 The patient is seen today and is in very good spirits and in much improved mood and affect.  He  states the trazodone has helped him at night during sleep.  He has seen a mental health provider at [facility] at this time and is awaiting further recommendations from [facility] on mental health therapy.  He has a follow-up visit with a therapist coming up soon.  The patient's migraine headaches are significantly improved and stable at this time.  In addition the patient has been abstinent from alcohol for over 9 weeks.  Patient is still smoking small amounts and is interested in further smoking cessation assistance.  He does have good breathing at this time and is on the Symbicort.  There have been no additional seizures and he maintains on Keppra and as well the patient is on folic acid and thiamine while off alcohol   There has not been any additional abdominal pain since the last visit  03/20/2020 Patient seen in return follow-up with history of hypertension and now diagnosed calcium deposits in the coronaries as well as aortic atherosclerosis with 50% occlusion at the distal aorta.  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  Patient also had presyncopal episodes as well.  He continues have chronic insomnia however trazodone has been somewhat of help.  He is yet to have a sleep study as we have requested previously.  Patient is still smoking about a half a pack a day of cigarettes and we discussed the need to reduce smoking previously.  Patient states his mood disorder has stabilized and does have follow-up with his mental health provider. The patient did go to the emergency room when he had increased chest discomfort with negative troponins negative EKG however has since seen cardiology and they are scheduling him for coronary CT scan along with a long-term rhythm monitor.  The patient needs to be on statins but with multiple other issues going on at this time we have held off on this also he needs a Covid vaccine but again elected to hold off on this during his current symptom complex    Past Medical History:  Diagnosis Date  . Alcohol withdrawal ([place]) 07/29/2013  . Allergy   . Anxiety   . Benzodiazepine abuse ([place]) 08/30/2019  . Bipolar disorder ([place])   . Chronic insomnia 06/06/2015  . Chronic migraine without aura, with intractable migraine, so stated, with status migrainosus 08/25/2019  . Cocaine abuse ([place]) 05/22/2015  . Delirium tremens ([place]) 07/29/2013  . Depression   . Epilepsy ([place])   . Hemiplegic migraine with status migrainosus 06/06/2015  . Hypertension   . Migraine   . Opioid use disorder ([place]) 08/30/2019  . Stroke [facility])    patient denies  . Substance abuse ([place])      Family History  Problem Relation Age of Onset  . Heart disease Mother   . Stroke Mother   . Epilepsy Mother   . Heart disease Father   . Hypertension Father   . Melanoma Father   . Migraines Father   . Colon cancer Sister   . Colon cancer Cousin      Social History   Socioeconomic History  . Marital status: Divorced    Spouse name: Not on file  . Number of children: 0  . Years of education: GED  .  Highest education level: Not on file  Occupational History  . Occupation: disabled  Tobacco Use  . Smoking status: Current Every  Day Smoker    Packs/day: 0.50    Years: 15.00    Pack years: 7.50    Types: Cigarettes  . Smokeless tobacco: Never Used  Substance and Sexual Activity  . Alcohol use: Yes    Alcohol/week: 6.0 standard drinks    Types: 6 Cans of beer per week    Comment: none since Sept 2020  . Drug use: Yes    Types: Marijuana    Comment: hx of crack, heroin; only THC since Sept 2020  . Sexual activity: Yes    Birth control/protection: Condom  Other Topics Concern  . Not on file  Social History Narrative   Pt lives with sister.   Patient drinks 2 cups of caffeine daily.   Patient is right handed.   Social Determinants of Health   Financial Resource Strain:   . Difficulty of Paying Living Expenses:   Food Insecurity:   . Worried About Charity fundraiser in the Last Year:   . Arboriculturist in the Last Year:   Transportation Needs:   . Film/video editor (Medical):   [name] Lack of Transportation (Non-Medical):   Physical Activity:   . Days of Exercise per Week:   . Minutes of Exercise per Session:   Stress:   . Feeling of Stress :   Social Connections:   . Frequency of Communication with Friends and Family:   . Frequency of Social Gatherings with Friends and Family:   . Attends Religious Services:   . Active Member of Clubs or Organizations:   . Attends Archivist Meetings:   [name] Marital Status:   Intimate Partner Violence:   . Fear of Current or Ex-Partner:   . Emotionally Abused:   [name] Physically Abused:   . Sexually Abused:      Allergies  Allergen Reactions  . Sertraline Other (See Comments)    Erectile dysfunction  . Codeine Nausea And Vomiting  . Depakote [Divalproex Sodium] Other (See Comments)    Made patient shake  . Migranal [Dihydroergotamine] Nausea And Vomiting  . Topamax [Topiramate] Other (See Comments)    Made patient  angry     Outpatient Medications Prior to Visit  Medication Sig Dispense Refill  . Botulinum Toxin Type A (BOTOX) 200 units SOLR Inject 1 vial as directed every 3 (three) months.    . dexamethasone (DECADRON) 2 MG tablet Keep on hand if needed for acute headache, take 3, then take 2, then take 1 6 tablet 0  . diphenhydrAMINE (BENADRYL) 25 MG tablet Take 1 tablet (25 mg total) by mouth 2 (two) times daily. 20 tablet 0  . levETIRAcetam (KEPPRA) 1000 MG tablet Take 1 tablet (1,000 mg total) by mouth 2 (two) times daily. 60 tablet 11  . ondansetron (ZOFRAN ODT) 4 MG disintegrating tablet Take 1 tablet (4 mg total) by mouth every 8 (eight) hours as needed for nausea or vomiting. 12 tablet 0  . traZODone (DESYREL) 100 MG tablet Take 2 tablets (200 mg total) by mouth at bedtime. 60 tablet 11  . albuterol (VENTOLIN HFA) 108 (90 Base) MCG/ACT inhaler Inhale 2 puffs into the lungs every 4 (four) hours as needed for shortness of breath or wheezing. 18 g 1  . budesonide-formoterol (SYMBICORT) 160-4.5 MCG/ACT inhaler Inhale 2 puffs into the lungs 2 (two) times daily. 1 Inhaler 12  . folic acid (FOLVITE) 1 MG tablet Take 1 tablet (1 mg total) by mouth daily. (Patient not taking: Reported on 03/20/2020) 30 tablet  3  . nicotine polacrilex (NICORETTE MINI) 4 MG lozenge Use one 4 times daily to stop smoking (Patient not taking: Reported on 03/07/2020) 100 tablet 4  . thiamine 100 MG tablet Take 1 tablet (100 mg total) by mouth daily. (Patient not taking: Reported on 03/20/2020) 30 tablet 3   No facility-administered medications prior to visit.     Review of Systems Constitutional:    weight loss, night sweats,  Fevers, chills, fatigue, lassitude. HEENT:   No headaches,  Difficulty swallowing,  Tooth/dental problems,  Sore throat,               o sneezing, itching, ear ache, nasal congestion, post nasal drip,   CV:   chest pain,  Orthopnea, PND, swelling in lower extremities, anasarca, dizziness,  palpitations  GI   heartburn, indigestion, abdominal pain, nausea, vomiting, diarrhea, change in bowel habits, loss of appetite  Resp:  shortness of breath with exertion or at rest.  No excess mucus, nproductive cough,  No non-productive cough,  No coughing up of blood.  No change in color of mucus.  No wheezing.  No chest wall deformity  Skin: no rash or lesions.  GU: no dysuria, change in color of urine, no urgency or frequency.  No flank pain.  MS:  No joint pain or swelling.  No decreased range of motion.  No back pain. Claudication  Psych:   change in mood or affect.  depression or anxiety.  No memory loss.     Objective:   Physical Exam   Vitals:   03/20/20 0834  BP: 126/84  Pulse: (!) 58  Resp: 16  Temp: 98.5 F (36.9 C)  SpO2: 100%  Weight: 158 lb (71.7 kg)  Height: 6' (1.829 m)    Gen: Pleasant, much improved affect from the previous office visits and phone visit well-nourished, in no distress,  normal affect  ENT: No lesions,  mouth clear,  oropharynx clear, no postnasal drip  Neck: No JVD, no TMG, no carotid bruits  Lungs: No use of accessory muscles, no dullness to percussion, clear without rales or rhonchi  Cardiovascular: RRR, heart sounds normal, no murmur or gallops, no peripheral edema Decreased DP and PT pulses bilateral LE  Abdomen: soft and NT, no HSM,  BS normal  Musculoskeletal: No deformities, no cyanosis or clubbing  Neuro: alert, non focal  Skin: Warm, no lesions or rashes   08/11/19 CT abdomen pelvis:   IMPRESSION: No acute CT finding to account for abdominal pain.  Hepatobiliary: Rounded hypodense lesion in segment 7, image 12 series 3. Lesion measures 16 mm in greatest diameter, and was not present or visualized on the prior noncontrast CT. There is differential enhancement pattern with less distinct border on the delayed CT images.  There is a small hypodense/hypoenhancing lesion within segment 7 of the liver which was not  present on the comparison noncontrast CT. The enhancement characteristics are suspicious, and while this could represent a vascular lesion, malignancy (either primary or metastatic) cannot be excluded. Nonemergent follow-up contrast-enhanced liver protocol MRI is recommended. These results were discussed by telephone at the time of interpretation on  MRI 09/20/19:  IMPRESSION: 1. 16 mm segment 7 liver lesion has MR imaging characteristics of a benign hepatic hemangioma. 2. No other liver lesions are identified. No intra or extrahepatic biliary dilatation. 3. No acute abdominal findings and no adenopathy.  Recent CT Angio chest noted    Assessment & Plan:  I personally reviewed all images and lab data  in the [facility] system as well as any outside material available during this office visit and agree with the  radiology impressions.   Abdominal aortic atherosclerosis (HCC) with 50% occlusion distal aorta Patient has abdominal aortic atherosclerosis with 50% occlusion of the aorta not a good candidate for statins due to muscular side effects of other medications  Plan will be to refer patient to vascular surgery for further evaluation  CAD (coronary artery disease) Coronary artery disease with calcification the coronaries plan is to obtain coronary CT scanning and also rhythm study per cardiology further recommendations will follow  Hypertension Hypertension under good control at this time we will continue current medication profile  COPD with chronic bronchitis ([place]) COPD with ongoing tobacco use we discussed strategies to reduce tobacco use and will prescribe bupropion 150 mg twice daily and continue inhaled medications  Tobacco use    . Current smoking consumption amount: 1 pack a day  . Dicsussion on advise to quit smoking and smoking impacts: Ongoing impact on the patient's cardiovascular and lung health  . Patient's willingness to quit:   Patient wishes to quit at this  time . Methods to quit smoking discussed: Best avenue to quit will be using bupropion 150 mg twice daily and avoid nicotine replacement  . Medication management of smoking session drugs discussed: Bupropion 150 mg twice daily prescribed  . Resources provided:  AVS   . Setting quit date quit date is arranged for 1 month  . Follow-up arranged return in 1 month   Time spent counseling the patient:      Diagnoses and all orders for this visit:  Coronary artery disease of native artery of native heart with stable angina pectoris ([place])  Abdominal aortic atherosclerosis ([place]) with 50% occlusion distal aorta  Tobacco use  COPD with chronic bronchitis (HCC)  MDD (major depressive disorder), recurrent severe, without psychosis ([place])  Chronic insomnia  Seizures ([place])  Essential hypertension  Other orders -     buPROPion (WELLBUTRIN SR) 150 MG 12 hr tablet; Take 1 tablet (150 mg total) by mouth 2 (two) times daily. -     budesonide-formoterol (SYMBICORT) 160-4.5 MCG/ACT inhaler; Inhale 2 puffs into the lungs 2 (two) times daily. -     albuterol (VENTOLIN HFA) 108 (90 Base) MCG/ACT inhaler; Inhale 2 puffs into the lungs every 4 (four) hours as needed for shortness of breath or wheezing. -     thiamine 100 MG tablet; Take 1 tablet (100 mg total) by mouth daily.

## 2020-03-20 NOTE — Progress Notes (Signed)
Here tof /u / seen by  Cardiologist

## 2020-03-22 ENCOUNTER — Ambulatory Visit (INDEPENDENT_AMBULATORY_CARE_PROVIDER_SITE_OTHER): Payer: Medicare Other

## 2020-03-22 ENCOUNTER — Other Ambulatory Visit: Payer: Self-pay

## 2020-03-22 ENCOUNTER — Ambulatory Visit (HOSPITAL_COMMUNITY): Payer: Medicare Other | Attending: Cardiology

## 2020-03-22 ENCOUNTER — Telehealth: Payer: Self-pay

## 2020-03-22 DIAGNOSIS — R072 Precordial pain: Secondary | ICD-10-CM

## 2020-03-22 DIAGNOSIS — R55 Syncope and collapse: Secondary | ICD-10-CM

## 2020-03-22 DIAGNOSIS — I25118 Atherosclerotic heart disease of native coronary artery with other forms of angina pectoris: Secondary | ICD-10-CM

## 2020-03-22 NOTE — Telephone Encounter (Signed)
-----   Message from Jerline Pain, MD sent at 03/22/2020  3:20 PM EDT ----- Overall reassuring echocardiogram with normal pump function no evidence of any significant valvular abnormalities. Candee Furbish, MD

## 2020-03-22 NOTE — Telephone Encounter (Signed)
The patient has been notified of the Echo result and verbalized understanding.  All questions (if any) were answered. Frederik Schmidt, RN 03/22/2020 3:34 PM

## 2020-03-28 ENCOUNTER — Telehealth (HOSPITAL_COMMUNITY): Payer: Self-pay | Admitting: *Deleted

## 2020-03-28 NOTE — Telephone Encounter (Signed)
Reaching out to patient to offer assistance regarding upcoming cardiac imaging study; pt verbalizes understanding of appt date/time, parking situation and where to check in, pre-test NPO status and medications ordered, and verified current allergies; name and call back number provided for further questions should they arise  [place]ice Liz Tai RN Navigator Cardiac Imaging  Heart and Vascular [phone] office [phone] cell 

## 2020-03-29 ENCOUNTER — Ambulatory Visit (HOSPITAL_COMMUNITY)
Admission: RE | Admit: 2020-03-29 | Discharge: 2020-03-29 | Disposition: A | Discharge status: Home / Self Care | Payer: Medicare Other | Source: Ambulatory Visit | Attending: Cardiology | Admitting: Cardiology

## 2020-03-29 ENCOUNTER — Telehealth: Payer: Self-pay | Admitting: *Deleted

## 2020-03-29 ENCOUNTER — Other Ambulatory Visit: Payer: Self-pay

## 2020-03-29 DIAGNOSIS — R55 Syncope and collapse: Secondary | ICD-10-CM | POA: Diagnosis present

## 2020-03-29 DIAGNOSIS — I25118 Atherosclerotic heart disease of native coronary artery with other forms of angina pectoris: Secondary | ICD-10-CM | POA: Diagnosis present

## 2020-03-29 DIAGNOSIS — R072 Precordial pain: Secondary | ICD-10-CM | POA: Insufficient documentation

## 2020-03-29 MED ORDER — IOHEXOL 350 MG/ML SOLN
100.0000 mL | Freq: Once | INTRAVENOUS | Status: AC | PRN
  Administered 2020-03-29: 100 mL via INTRAVENOUS

## 2020-03-29 MED ORDER — NITROGLYCERIN 0.4 MG SL SUBL
SUBLINGUAL_TABLET | SUBLINGUAL | Status: AC
  Filled 2020-03-29: qty 2

## 2020-03-29 MED ORDER — NITROGLYCERIN 0.4 MG SL SUBL
0.8000 mg | SUBLINGUAL_TABLET | Freq: Once | SUBLINGUAL | Status: AC
  Administered 2020-03-29: 0.8 mg via SUBLINGUAL

## 2020-03-29 NOTE — Telephone Encounter (Signed)
Patient has a Botox appointment on 04/25/2020.  I called [employer] [phone] and spoke to [place].  He states that [id number] and [phone] are valid and billable and do not require PA.  Ref# for this call is [id number].

## 2020-03-30 DIAGNOSIS — R55 Syncope and collapse: Secondary | ICD-10-CM | POA: Diagnosis not present

## 2020-03-30 DIAGNOSIS — I25118 Atherosclerotic heart disease of native coronary artery with other forms of angina pectoris: Secondary | ICD-10-CM

## 2020-03-30 DIAGNOSIS — R072 Precordial pain: Secondary | ICD-10-CM | POA: Diagnosis not present

## 2020-04-03 ENCOUNTER — Telehealth: Payer: Self-pay | Admitting: *Deleted

## 2020-04-03 DIAGNOSIS — Z79899 Other long term (current) drug therapy: Secondary | ICD-10-CM

## 2020-04-03 DIAGNOSIS — I2510 Atherosclerotic heart disease of native coronary artery without angina pectoris: Secondary | ICD-10-CM

## 2020-04-03 MED ORDER — ROSUVASTATIN CALCIUM 10 MG PO TABS: 10.0000 mg | ORAL_TABLET | Freq: Every day | ORAL | 3 refills | Status: DC

## 2020-04-03 NOTE — Telephone Encounter (Signed)
Follow up   Pt is returning call    

## 2020-04-03 NOTE — Telephone Encounter (Signed)
Spoke with pt and advised per Dr Marlou Porch Coronary CT shows: Mild to moderate nonobstructive coronary artery disease.  I think it makes sense for [place] to start Crestor 10 mg once a day. Would repeat lipid panel in 3 months with ALT. Lets have him return to clinic after the lipid panel for follow-up. ...  Pt agreeable to begin Crestor 10mg  daily.  Appointment scheduled for 07/05/2020 for labs and follow up appointment with Dr Marlou Porch 07/17/2020 at 9am.  Pt verbalizes understanding and agrees with current plan.

## 2020-04-03 NOTE — Telephone Encounter (Signed)
-----   Message from Jerline Pain, MD sent at 04/02/2020  4:43 PM EDT ----- Mild to moderate nonobstructive coronary artery disease.  I think it makes sense for [place] to start Crestor 10 mg once a day. Would repeat lipid panel in 3 months with ALT. Lets have him return to clinic after the lipid panel for follow-up. Candee Furbish, MD

## 2020-04-03 NOTE — Telephone Encounter (Signed)
Left message to call office

## 2020-04-04 ENCOUNTER — Encounter: Payer: Self-pay | Admitting: Vascular Surgery

## 2020-04-04 ENCOUNTER — Other Ambulatory Visit: Payer: Self-pay

## 2020-04-04 ENCOUNTER — Ambulatory Visit (INDEPENDENT_AMBULATORY_CARE_PROVIDER_SITE_OTHER): Payer: Medicare Other | Admitting: Vascular Surgery

## 2020-04-04 VITALS — BP 116/60 | HR 54 | Temp 97.7°F | Resp 20 | Ht 72.0 in | Wt 157.0 lb

## 2020-04-04 DIAGNOSIS — I70219 Atherosclerosis of native arteries of extremities with intermittent claudication, unspecified extremity: Secondary | ICD-10-CM

## 2020-04-04 NOTE — Progress Notes (Addendum)
REASON FOR CONSULT:    Aortic obstruction and claudication.  The consult is requested by Dr. Asencion Noble.  ASSESSMENT & PLAN:   PERIPHERAL VASCULAR DISEASE: This patient does have some mild aortic stenosis on CT angiogram however he has palpable femoral and pedal pulses bilaterally.  He gets significant pain in his legs simply with standing which would be more consistent with neurogenic pain.  Regardless I have encouraged him to quit smoking and we have had a long discussion about the importance of this.  In addition we discussed the importance of exercise and nutrition.  I have also instructed him to begin taking 81 mg of aspirin daily.  He is on a statin.  I have ordered for follow-up ABIs in 1 year and I will see him back at that time.  He knows to call sooner if he has problems.  IMA STENOSIS: The patient does have an IMA stenosis on his CT angiogram however the celiac axis and superior mesenteric artery are widely patent I do not think his occasional abdominal pain is related to mesenteric ischemia.   Deitra Mayo, MD Office: [phone]   HPI:   Spencer Mendoza is a pleasant 59 y.o. male, who underwent a CT angio of the chest and abdomen which showed evidence of an aortic stenosis and he was sent for vascular consultation.  On my history the patient does describe some calf claudication bilaterally.  This occurs at a fairly short distance.  His symptoms are brought on by ambulation and relieved with rest.  However he is also had significant pain in his legs associated with simply standing.  Many years ago he did fall from the third story landing on his back.  Currently he does not have a back pain specifically but has a history of sciatica in the past.  He describes pain in his legs and his legs giving out on him when he is standing.  I do not get any history of rest pain or nonhealing ulcers.  His risk factors for peripheral vascular disease include tobacco use.  He smokes a  half a pack per day and has been smoking for 45 years.  He denies any history of diabetes, hypertension, hypercholesterolemia, or family history of premature cardiovascular disease.  He is also complained of some chest pain and shortness of breath recently.  He is being followed by Dr. Candee Furbish.  He does occasionally get some abdominal pain after eating but this is not consistent.  He is also lost some weight over the last year.  He also complains of dizziness which usually occurs when he stands quickly.  He states that he had strokelike symptoms in the past but this was attributed to migraines.  I do not get any clear-cut history of TIAs amaurosis fugax or expressive aphasia.  Past Medical History:  Diagnosis Date  . Alcohol withdrawal ([place]) 07/29/2013  . Allergy   . Anxiety   . Benzodiazepine abuse ([place]) 08/30/2019  . Bipolar disorder ([place])   . Chronic insomnia 06/06/2015  . Chronic migraine without aura, with intractable migraine, so stated, with status migrainosus 08/25/2019  . Cocaine abuse ([place]) 05/22/2015  . Delirium tremens ([place]) 07/29/2013  . Depression   . Epilepsy ([place])   . Hemiplegic migraine with status migrainosus 06/06/2015  . Hypertension   . Migraine   . Opioid use disorder ([place]) 08/30/2019  . Stroke [facility])    patient denies  . Substance abuse ([place])     Family History  Problem Relation Age of Onset  . Heart disease Mother   . Stroke Mother   . Epilepsy Mother   . Heart disease Father   . Hypertension Father   . Melanoma Father   . Migraines Father   . Colon cancer Sister   . Colon cancer Cousin     SOCIAL HISTORY: Social History   Socioeconomic History  . Marital status: Legally Separated    Spouse name: Not on file  . Number of children: 0  . Years of education: GED  . Highest education level: Not on file  Occupational History  . Occupation: disabled  Tobacco Use  . Smoking status: Current Every Day Smoker    Packs/day: 0.50    Years: 15.00     Pack years: 7.50    Types: Cigarettes  . Smokeless tobacco: Never Used  Substance and Sexual Activity  . Alcohol use: Yes    Alcohol/week: 6.0 standard drinks    Types: 6 Cans of beer per week    Comment: none since Sept 2020  . Drug use: Yes    Types: Marijuana    Comment: hx of crack, heroin; only THC since Sept 2020  . Sexual activity: Yes    Birth control/protection: Condom  Other Topics Concern  . Not on file  Social History Narrative   Pt lives with sister.   Patient drinks 2 cups of caffeine daily.   Patient is right handed.   Social Determinants of Health   Financial Resource Strain:   . Difficulty of Paying Living Expenses:   Food Insecurity:   . Worried About Charity fundraiser in the Last Year:   . Arboriculturist in the Last Year:   Transportation Needs:   . Film/video editor (Medical):   [name] Lack of Transportation (Non-Medical):   Physical Activity:   . Days of Exercise per Week:   . Minutes of Exercise per Session:   Stress:   . Feeling of Stress :   Social Connections:   . Frequency of Communication with Friends and Family:   . Frequency of Social Gatherings with Friends and Family:   . Attends Religious Services:   . Active Member of Clubs or Organizations:   . Attends Archivist Meetings:   [name] Marital Status:   Intimate Partner Violence:   . Fear of Current or Ex-Partner:   . Emotionally Abused:   [name] Physically Abused:   . Sexually Abused:     Allergies  Allergen Reactions  . Sertraline Other (See Comments)    Erectile dysfunction  . Codeine Nausea And Vomiting  . Depakote [Divalproex Sodium] Other (See Comments)    Made patient shake  . Migranal [Dihydroergotamine] Nausea And Vomiting  . Topamax [Topiramate] Other (See Comments)    Made patient angry    Current Outpatient Medications  Medication Sig Dispense Refill  . albuterol (VENTOLIN HFA) 108 (90 Base) MCG/ACT inhaler Inhale 2 puffs into the lungs every 4 (four) hours as  needed for shortness of breath or wheezing. 18 g 1  . Botulinum Toxin Type A (BOTOX) 200 units SOLR Inject 1 vial as directed every 3 (three) months.    . budesonide-formoterol (SYMBICORT) 160-4.5 MCG/ACT inhaler Inhale 2 puffs into the lungs 2 (two) times daily. 1 Inhaler 12  . buPROPion (WELLBUTRIN SR) 150 MG 12 hr tablet Take 1 tablet (150 mg total) by mouth 2 (two) times daily. 60 tablet 2  . dexamethasone (DECADRON) 2 MG tablet Keep  on hand if needed for acute headache, take 3, then take 2, then take 1 6 tablet 0  . diphenhydrAMINE (BENADRYL) 25 MG tablet Take 1 tablet (25 mg total) by mouth 2 (two) times daily. 20 tablet 0  . folic acid (FOLVITE) 1 MG tablet Take 1 tablet (1 mg total) by mouth daily. 30 tablet 3  . levETIRAcetam (KEPPRA) 1000 MG tablet Take 1 tablet (1,000 mg total) by mouth 2 (two) times daily. 60 tablet 11  . ondansetron (ZOFRAN ODT) 4 MG disintegrating tablet Take 1 tablet (4 mg total) by mouth every 8 (eight) hours as needed for nausea or vomiting. 12 tablet 0  . rosuvastatin (CRESTOR) 10 MG tablet Take 1 tablet (10 mg total) by mouth daily. 90 tablet 3  . thiamine 100 MG tablet Take 1 tablet (100 mg total) by mouth daily. 30 tablet 3  . traZODone (DESYREL) 100 MG tablet Take 2 tablets (200 mg total) by mouth at bedtime. 60 tablet 11  . nicotine polacrilex (NICORETTE MINI) 4 MG lozenge Use one 4 times daily to stop smoking (Patient not taking: Reported on 03/07/2020) 100 tablet 4   No current facility-administered medications for this visit.    REVIEW OF SYSTEMS:  [X]  denotes positive finding, [ ]  denotes negative finding Cardiac  Comments:  Chest pain or chest pressure: x   Shortness of breath upon exertion: x   Short of breath when lying flat: x   Irregular heart rhythm:        Vascular    Pain in calf, thigh, or hip brought on by ambulation:    Pain in feet at night that wakes you up from your sleep:     Blood clot in your veins: x   Leg swelling:           Pulmonary    Oxygen at home:    Productive cough:     Wheezing:         Neurologic    Sudden weakness in arms or legs:  x   Sudden numbness in arms or legs:  x   Sudden onset of difficulty speaking or slurred speech:    Temporary loss of vision in one eye:     Problems with dizziness:  x       Gastrointestinal    Blood in stool:     Vomited blood:         Genitourinary    Burning when urinating:     Blood in urine:        Psychiatric    Major depression:         Hematologic    Bleeding problems:    Problems with blood clotting too easily:        Skin    Rashes or ulcers:        Constitutional    Fever or chills:     PHYSICAL EXAM:   Vitals:   04/04/20 0919  BP: 116/60  Pulse: (!) 54  Resp: 20  Temp: 97.7 F (36.5 C)  SpO2: 97%  Weight: 157 lb (71.2 kg)  Height: 6' (1.829 m)    GENERAL: The patient is a well-nourished male, in no acute distress. The vital signs are documented above. CARDIAC: There is a regular rate and rhythm.  VASCULAR: I do not detect carotid bruits. He has palpable femoral, popliteal, and pedal pulses bilaterally. He has biphasic posterior tibial signals with a Doppler and brisk dorsalis pedis signals with the Doppler. He has  no significant lower extremity swelling. PULMONARY: There is good air exchange bilaterally without wheezing or rales. ABDOMEN: Soft and non-tender with normal pitched bowel sounds.  MUSCULOSKELETAL: There are no major deformities or cyanosis. NEUROLOGIC: No focal weakness or paresthesias are detected. SKIN: There are no ulcers or rashes noted. PSYCHIATRIC: The patient has a normal affect.  DATA:    LABS: I reviewed his labs from 02/28/2020.  GFR was greater than 60.  Creatinine 1.02.  Platelets 270,000.  Hemoglobin 15.0.  CT CORONARY CALCIUM SCORE: He did have a CT for calcium score on 03/29/2020.  This was a poor quality scan due to motion.  His coronary calcium score was 330 which was in the 94th percentile for  his age and sex.  He had normal coronary origin with right dominance.  He had moderate plaque in the mid LAD and mild plaque in the RCA.  CT ANGIO ABDOMEN AND CHEST: I reviewed his CT angio of the chest abdomen and pelvis that was done on 02/28/2020.  He is noted to have a less than 50% stenosis in the distal aorta.  He did have some calcific plaque in the common and internal iliac arteries bilaterally but no focal stenosis.  There was some stenosis in the inferior mesenteric artery however the celiac axis and superior mesenteric artery is widely patent.

## 2020-04-09 ENCOUNTER — Other Ambulatory Visit: Payer: Self-pay | Admitting: *Deleted

## 2020-04-09 DIAGNOSIS — I70219 Atherosclerosis of native arteries of extremities with intermittent claudication, unspecified extremity: Secondary | ICD-10-CM

## 2020-04-24 ENCOUNTER — Other Ambulatory Visit: Payer: Self-pay | Admitting: *Deleted

## 2020-04-24 MED ORDER — DEXAMETHASONE 2 MG PO TABS: ORAL_TABLET | ORAL | 0 refills | Status: DC

## 2020-04-25 ENCOUNTER — Ambulatory Visit: Payer: Medicare Other | Admitting: Neurology

## 2020-05-28 ENCOUNTER — Ambulatory Visit: Payer: Medicare Other | Attending: Critical Care Medicine | Admitting: Licensed Clinical Social Worker

## 2020-05-28 ENCOUNTER — Telehealth: Payer: Self-pay

## 2020-05-28 DIAGNOSIS — F332 Major depressive disorder, recurrent severe without psychotic features: Secondary | ICD-10-CM

## 2020-05-28 DIAGNOSIS — F411 Generalized anxiety disorder: Secondary | ICD-10-CM

## 2020-05-28 NOTE — Telephone Encounter (Signed)
Pt request urgent referral to psych. Please ASAP refer. Pt now has [employer] ins.
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
## 2020-05-28 NOTE — Telephone Encounter (Signed)
Referral has been placed. 

## 2020-06-01 ENCOUNTER — Telehealth: Payer: Self-pay | Admitting: Licensed Clinical Social Worker

## 2020-06-01 ENCOUNTER — Telehealth: Payer: Self-pay

## 2020-06-01 ENCOUNTER — Ambulatory Visit: Payer: Self-pay | Admitting: *Deleted

## 2020-06-01 MED ORDER — HYDROXYZINE HCL 25 MG PO TABS
25.0000 mg | ORAL_TABLET | Freq: Three times a day (TID) | ORAL | 0 refills | Status: DC | PRN
Start: 2020-06-01 — End: 2020-08-27

## 2020-06-01 NOTE — Telephone Encounter (Signed)
C/o vomiting x5 days "bile" yellow - brown liquid after having panic attacks and seizures. Patient reported last panic attack this am and seizure but did not inform of how long seizure lasts or postictal state. Patient reports 1 dose left of zofran and agiated with Triage nurse questions.  Reports going to ED on Monday and no relief of symptoms after a 9 hour wait.Refuses to go back to ED or urgent care for evaluation. Denies dehydration symptoms. Wants to see psychiatrist that has not been established at this time. Patient's sister with patient and requesting any medication to help decrease panic attacks before weekend. Instructed patient and sister to go to ED or urgent care. Care advise given. Patient refused care advise. Patient's sister verbalized understanding of care advise. Patient's sister requesting office to call patient back.  Reason for Disposition  [1] Vomiting AND [2] contains bile (green color)  Answer Assessment - Initial Assessment Questions 1. VOMITING SEVERITY: "How many times have you vomited in the past 24 hours?"     - MILD:  1 - 2 times/day    - MODERATE: 3 - 5 times/day, decreased oral intake without significant weight loss or symptoms of dehydration    - SEVERE: 6 or more times/day, vomits everything or nearly everything, with significant weight loss, symptoms of dehydration      Moderate  2. ONSET: "When did the vomiting begin?"      Sunday . Happens after panic attacks and seizure 3. FLUIDS: "What fluids or food have you vomited up today?" "Have you been able to keep any fluids down?"     "bile" yellow- brown liquid 4. ABDOMINAL PAIN: "Are your having any abdominal pain?" If yes : "How bad is it and what does it feel like?" (e.g., crampy, dull, intermittent, constant)      n/a 5. DIARRHEA: "Is there any diarrhea?" If Yes, ask: "How many times today?"      n/a 6. CONTACTS: "Is there anyone else in the family with the same symptoms?"      No.  7. CAUSE: "What do you  think is causing your vomiting?"     Panic attacks and seizures 8. HYDRATION STATUS: "Any signs of dehydration?" (e.g., dry mouth [not only dry lips], too weak to stand) "When did you last urinate?"     denies 9. OTHER SYMPTOMS: "Do you have any other symptoms?" (e.g., fever, headache, vertigo, vomiting blood or coffee grounds, recent head injury)     No patient very agitated  Protocols used: [facility]

## 2020-06-01 NOTE — Telephone Encounter (Signed)
Called pt DOB and name verified/  Pt agitated stated he needs his Xanax and he spoke with nurse triage already, he wants Dr Joya Gaskins to prescribe Xanax / message given to Dr Margarita Rana

## 2020-06-01 NOTE — Telephone Encounter (Signed)
Addendum: called patient to provide information to seek assistance at the [facility] urgent [facility] crisis center. Patient refused and became agitated again. Provided emotional support to patient . Patient apologized for outbursts but wants xanax prescription until seen by psychiatrist.

## 2020-06-01 NOTE — Telephone Encounter (Signed)
I recommend an ED visit if he is having seizures.  For panic attacks I have sent a short supply of hydroxyzine to the pharmacy.  Please place him on schedule with available clinician for an office visit.

## 2020-06-01 NOTE — Telephone Encounter (Signed)
Call placed to patient with RN, Bonnita Nasuti.   Pt reports increase in panic attacks and seizures. RN informed patient that a prescription for hydroxyzine has been sent to pharmacy to assist with symptoms. Pt was strongly encouraged to visit an ED and LCSW offered to call an ambulance if pt has transportation barriers. Pt refused and began cursing that he needs a prescription for xanax stating hydroxyzine does not alleviate his symptoms.   Pt refused to visit the [facility] during today's walk-in hours and disconnected call.

## 2020-06-01 NOTE — Telephone Encounter (Signed)
Placed call in presence of [place]. See below note. Pt refused any care or any instructions other meds than XANAX . Pt Disconnected call when told covering physician prescribed Hydroxyzine.

## 2020-06-04 ENCOUNTER — Telehealth: Payer: Self-pay | Admitting: Critical Care Medicine

## 2020-06-04 IMAGING — CT CT ANGIO CHEST-ABD-PELV FOR DISSECTION W/ AND WO/W CM
2 of 9 series · 11 of 46 positions shown, 12 images · IV contrast (Omnipaque)
Comparison: CT abdomen and pelvis August 11, 2019

CLINICAL DATA: Chest and abdominal pain

EXAM:
CT ANGIOGRAPHY CHEST, ABDOMEN AND PELVIS
TECHNIQUE: Non-contrast CT of the chest was initially obtained in axial
projection.

[Series 5: axial arterial · axial · arterial · 0.85mm/px · z∈[-652,-96]mm · 8 of 235 slices shown, 9 images]
[im 25/235  soft-tissue]
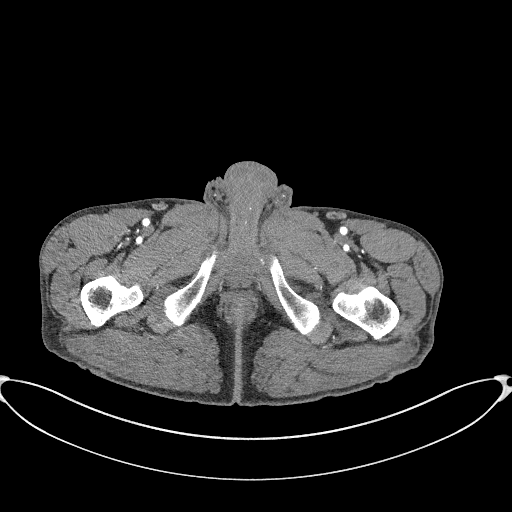
[im 25/235  bone]
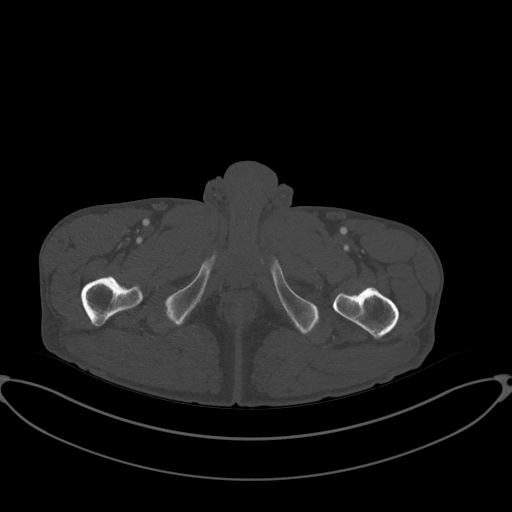
[im 50/235  soft-tissue]
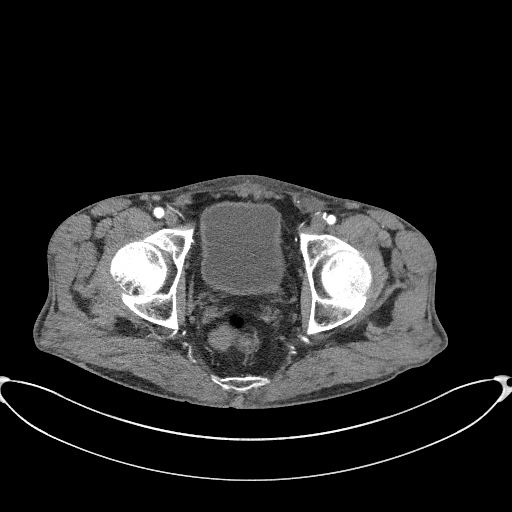
[im 74/235  soft-tissue]
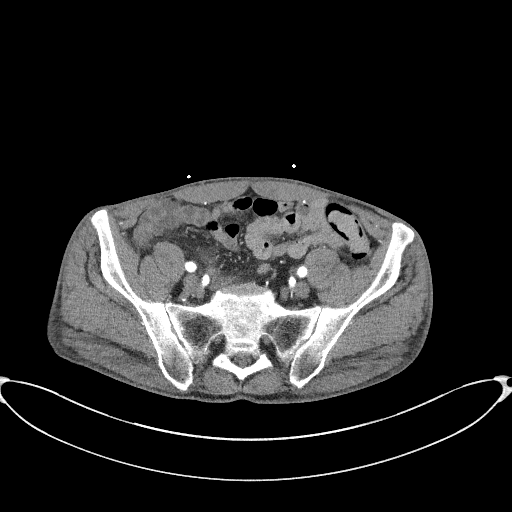
[im 99/235  soft-tissue]
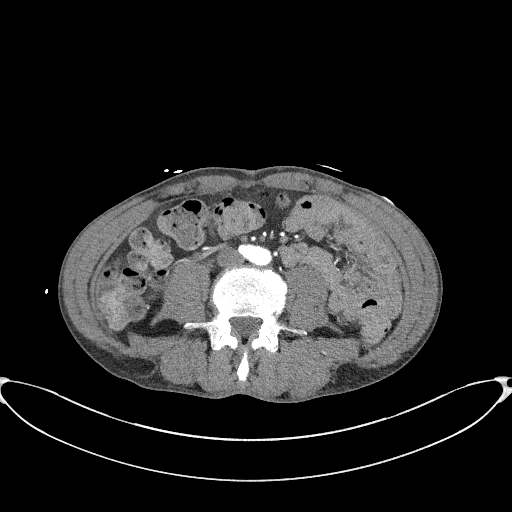
[im 136/235  soft-tissue]
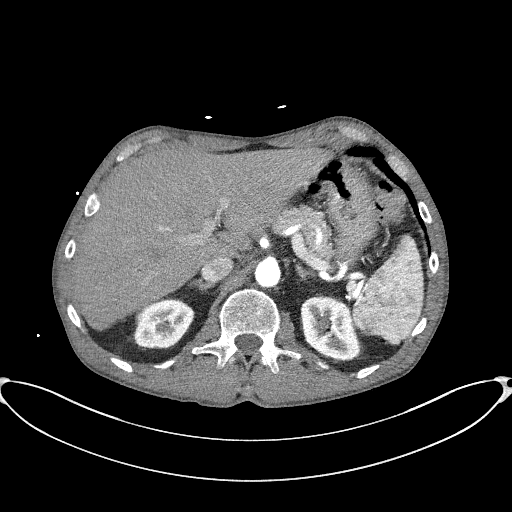
[im 161/235  soft-tissue]
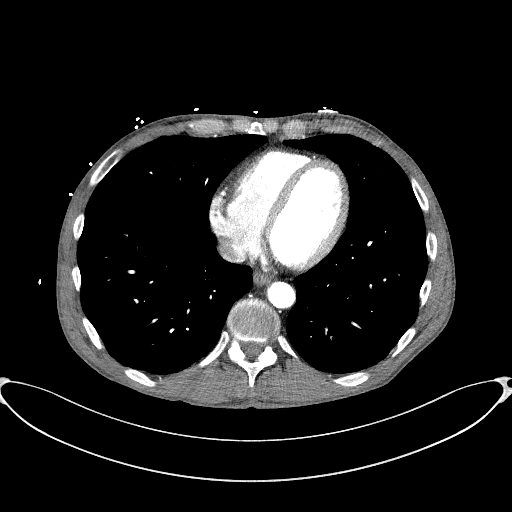
[im 185/235  soft-tissue]
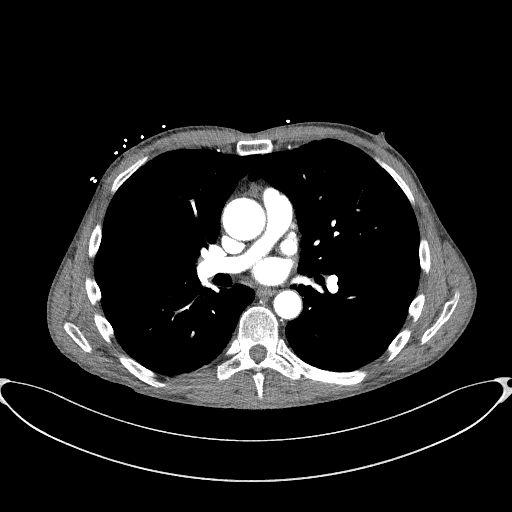
[im 210/235  soft-tissue]
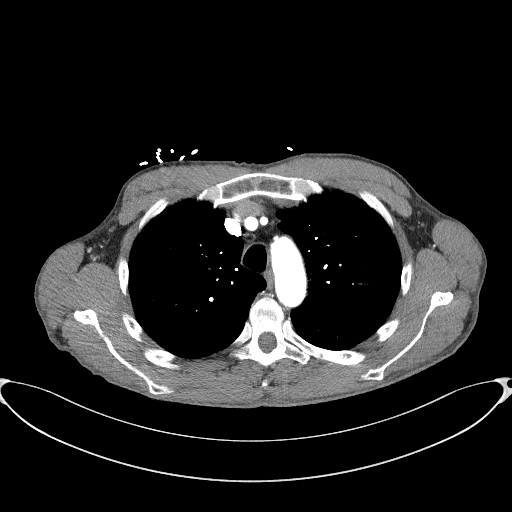

[Series 7: coronals · coronal · 0.81mm/px · 3 of 139 slices shown]
[im 35/139  soft-tissue]
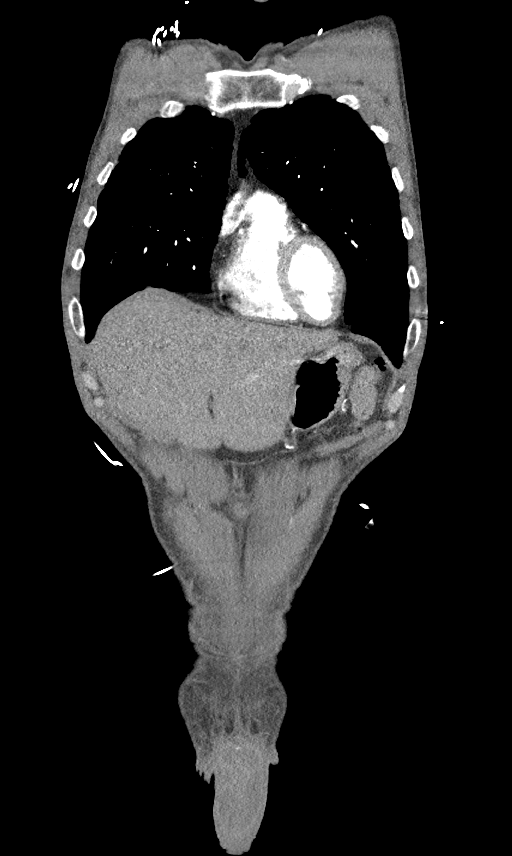
[im 70/139  soft-tissue]
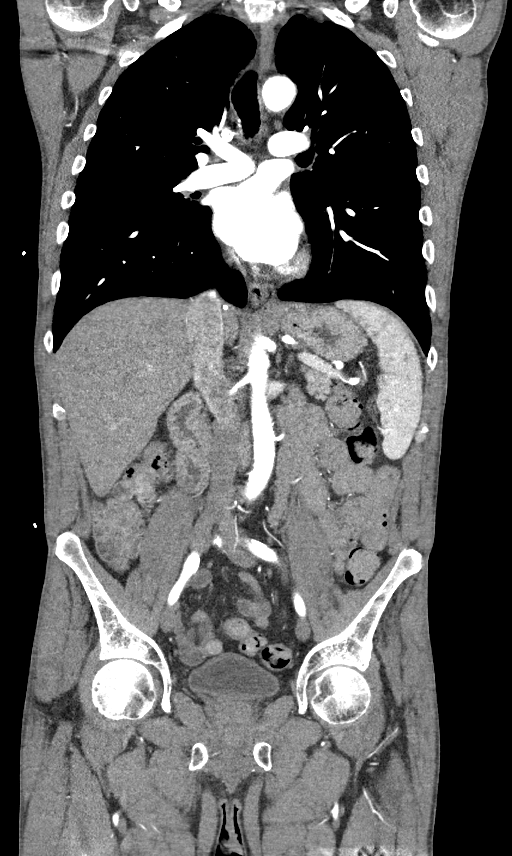
[im 104/139  soft-tissue]
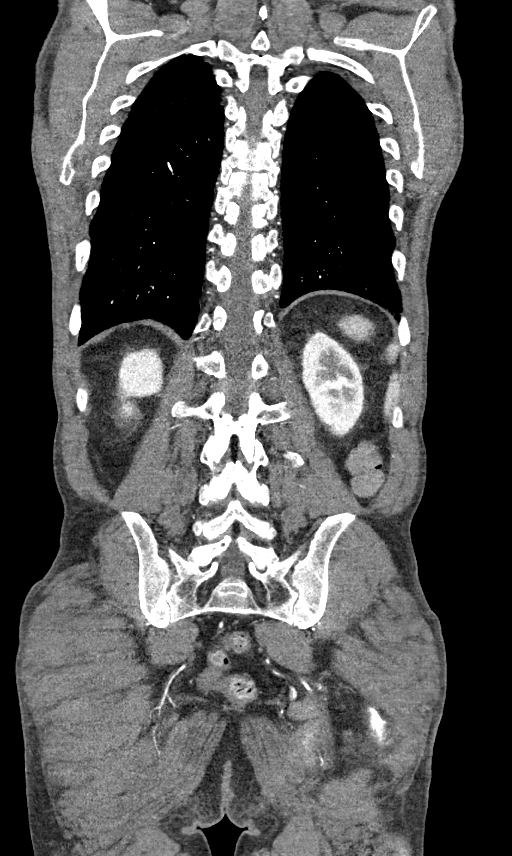

[11 of 46 positions shown; findings below may reference images not displayed]

Multidetector CT imaging through the chest, abdomen and pelvis was
performed using the standard protocol during bolus administration of
intravenous contrast. Multiplanar reconstructed images and MIPs were
obtained and reviewed to evaluate the vascular anatomy.

CONTRAST:  100mL OMNIPAQUE IOHEXOL 350 MG/ML SOLN
FINDINGS: CTA CHEST FINDINGS

Cardiovascular: There is no intramural hematoma evident on
noncontrast enhanced study. There is no intramural hematoma. There
is no evident thoracic aortic aneurysm or dissection. There is no
aneurysm or dissection involving visualized great vessels. There are
scattered foci calcification in the right innominate and left
subclavian arteries without hemodynamically significant obstruction
in these vessels. There are foci of aortic atherosclerosis. There
are multiple foci of coronary artery calcification. There is no
pericardial effusion or pericardial thickening. There is no
demonstrable pulmonary embolus.

Mediastinum/Nodes: Thyroid appears unremarkable. No evident
adenopathy by size criteria. There are several subcentimeter
mediastinal lymph nodes which do not meet size criteria for
pathologic significance. No esophageal lesions are evident.

Lungs/Pleura: There is underlying centrilobular and paraseptal
emphysematous change. There are areas of reticular interstitial
thickening and fibrosis in the periphery of each upper lobe
anteriorly, in the posterior segment right upper lobe, and in the
posterior lung base regions. No edema or airspace opacity is
evident. No pleural effusions are evident. Small bullae are noted in
the upper lobes, slightly more on the left than on the right.

Musculoskeletal: No blastic or lytic bone lesions. No evident chest
wall lesions.

Review of the MIP images confirms the above findings.

CTA ABDOMEN AND PELVIS FINDINGS

VASCULAR

Aorta: No abdominal aortic aneurysm or dissection. There are foci of
calcification and peripheral plaque in the mid to distal aorta
without hemodynamically significant obstruction evident.

Celiac: The celiac artery and its branches are widely patent. No
evident aneurysm or dissection involving these vessels.

SMA: Superior mesenteric artery and its branches are widely patent.
No aneurysm or dissection involving these vessels.

Renals: There is a single renal artery evident on the right. There
is a main renal artery on the left with a smaller renal artery on
the left supplying the lower pole region. There is mild
atherosclerosis in the proximal right renal artery with less than
50% diameter stenosis in this area. Minimal calcification noted in
the proximal left renal artery. No aneurysm or dissection involving
these vessels. No fibromuscular dysplasia evident. No
hemodynamically significant obstruction involving renal arteries and
their branches.

IMA: There is plaque at the origin of the inferior mesenteric artery
causing approximately 75% diameter stenosis. More peripheral
branches of the inferior mesenteric artery are patent. No aneurysm
or dissection involving these vessels evident.

Inflow: There is calcification in both common iliac arteries,
somewhat more on the right than on the left with less than 50%
diameter obstruction in these vessels. There are less than 50%
diameter foci of plaque in each internal iliac artery. Major pelvic
arterial vessels elsewhere appear patent. There are foci of
calcification less than 50% diameter stenosis focally in each distal
common femoral artery. No aneurysm or dissection involving pelvic
arterial vessels noted. The proximal superficial femoral and
profunda femoral arteries are patent without aneurysm or dissection.

Veins: No obvious venous abnormality within the limitations of this
arterial phase study.

Review of the MIP images confirms the above findings.

NON-VASCULAR

Hepatobiliary: No focal liver lesions evident. Gallbladder wall is
not appreciably thickened. There is no biliary duct dilatation.

Pancreas: There is no pancreatic mass or inflammatory focus.

Spleen: No splenic lesions are evident.

Adrenals/Urinary Tract: Adrenals bilaterally appear unremarkable.
Kidneys bilaterally show no evident mass or hydronephrosis on either
side. There is no evident renal or ureteral calculus on either side.
Urinary bladder is midline with wall thickness within normal limits.

Stomach/Bowel: There is moderate stool throughout the colon. There
is no appreciable bowel wall or mesenteric thickening. There is no
evident bowel obstruction. The terminal ileum appears normal. There
is no evident free air or portal venous air.

Lymphatic: No adenopathy is evident in the abdomen or pelvis.

Reproductive: Prostate and seminal vesicles are normal in size and
contour. No evident pelvic mass.

Other: Appendix absent. No periappendiceal region inflammation. No
evident abscess or ascites in the abdomen or pelvis.

Musculoskeletal: No blastic or lytic bone lesions. No intramuscular
or abdominal wall lesions evident.

Review of the MIP images confirms the above findings.
IMPRESSION: CT angiogram chest:

1. No thoracic aortic aneurysm or dissection. There are foci of
aortic atherosclerosis as well as foci of great vessel and coronary
artery calcification.

2.  No evident pulmonary embolus.

3. Emphysematous change with areas of peripheral fibrotic type
change. No edema or airspace opacity. No pleural effusions.

4.  No evident adenopathy.

CT angiogram abdomen; CT angiogram pelvis:

1. There is plaque causing less than 50% diameter obstruction in the
mid to distal aorta. No aneurysm or dissection involving the aorta.

2. Approximately 75% diameter stenosis at the origin of the inferior
mesenteric artery. No other hemodynamically significant obstruction
noted in the mesenteric arterial vessels. No aneurysm or dissection
involving major mesenteric arterial vessels. No fibromuscular
dysplasia.

3. Foci of calcification in portions of the common and internal
iliac arteries bilaterally as well as focally in each distal common
femoral artery without hemodynamically significant obstruction in
these vessels. No aneurysm or dissection involving pelvic arterial
vessels.

4. No bowel wall thickening or bowel obstruction. No abscess in the
abdomen or pelvis. Appendix absent. No periappendiceal region
inflammation.

5. No renal or ureteral calculus. No hydronephrosis. Urinary bladder
wall thickness normal.

Aortic Atherosclerosis (T8JK2-OW0.0) and Emphysema (T8JK2-2A7.4).

## 2020-06-04 NOTE — Telephone Encounter (Signed)
Follow up call placed to patient. LCSW informed patient that PCP will not prescribe benzodiazepines at this time. Pt agreed to initiate services with psychiatry. He is aware of [place]; however, prefers to be contacted with an appointment. LCSW scheduled follow up appointment with pt for 06/13/20. No additional concerns noted.

## 2020-06-04 NOTE — Telephone Encounter (Signed)
I have never Rx xanax for the patient except for 2 tablets given in November so he could get an MRI done.  I am not willing to Rx benzodiazepines at this time.  There is a referral to psychiatry made last week, I would f/u and get the pt an appt asap with psychiatry

## 2020-06-04 NOTE — Telephone Encounter (Signed)
Copied from [place] [phone]. Topic: General - Other >> Jun 04, 2020  8:52 AM Rainey Pines A wrote: Patient would like a callback from [facility] to speak with her about the behavioral health clinic. >> Jun 04, 2020  9:52 AM Sheran Luz wrote: Patient calling back, requesting to speak with [facility]. Please contact patient.

## 2020-06-13 ENCOUNTER — Ambulatory Visit: Payer: Medicare Other | Attending: Critical Care Medicine | Admitting: Licensed Clinical Social Worker

## 2020-06-13 ENCOUNTER — Other Ambulatory Visit: Payer: Self-pay

## 2020-06-13 DIAGNOSIS — F411 Generalized anxiety disorder: Secondary | ICD-10-CM

## 2020-06-13 DIAGNOSIS — F1310 Sedative, hypnotic or anxiolytic abuse, uncomplicated: Secondary | ICD-10-CM

## 2020-06-13 DIAGNOSIS — F332 Major depressive disorder, recurrent severe without psychotic features: Secondary | ICD-10-CM

## 2020-06-13 NOTE — BH Specialist Note (Signed)
[place] Visit via Telemedicine (Telephone)  06/13/2020 Spencer Mendoza [id number]   Session Start time: 8:30 AM  Session End time: 8:40 AM Total time: 10  Referring Provider: Dr. Joya Gaskins Type of Visit: Telephonic Patient location: Home [facility] Provider location: Office All persons participating in visit: LCSW and Patient  Confirmed patient's address: Yes  Confirmed patient's phone number: Yes  Any changes to demographics: No   Confirmed patient's insurance: Yes  Any changes to patient's insurance: No   Discussed confidentiality: Yes    The following statements were read to the patient and/or legal guardian that are established with the [facility] Provider.  "The purpose of this phone visit is to provide behavioral health care while limiting exposure to the coronavirus (COVID19).  There is a possibility of technology failure and discussed alternative modes of communication if that failure occurs."  "By engaging in this telephone visit, you consent to the provision of healthcare.  Additionally, you authorize for your insurance to be billed for the services provided during this telephone visit."   Patient and/or legal guardian consented to telephone visit: Yes   PRESENTING CONCERNS: Patient and/or family reports the following symptoms/concerns: Pt reports anxiety symptoms have decreased by taking xanax (buying off streets), "it's the only thing that stops the panic attacks and seizures" LCSW and patient discussed the risks of buying pills from the street. Pt has an upcoming appointment with psychiatry in September 2021.  Duration of problem: Ongoing; Severity of problem: severe  STRENGTHS (Protective Factors/Coping Skills): Pt has desire to change  GOALS ADDRESSED: Patient will: 1.  Reduce symptoms of: anxiety  2.  Increase knowledge and/or ability of: self-management skills  3.  Demonstrate ability to: Increase adequate support systems for  patient/family, Increase motivation to adhere to plan of care and Decrease self-medicating behaviors  INTERVENTIONS: Interventions utilized:  Mindfulness or Relaxation Training and Supportive Counseling Standardized Assessments completed: Not Needed  ASSESSMENT: Patient currently experiencing difficulty managing anxiety attacks, which patient reports induces seizures. He is currently purchasing xanax from the street to manage symptoms.    Patient may benefit from medication management and psychotherapy to assist in management of symptoms. Grounding interventions were discussed to assist with panic attacks. Pt identified breathing techniques and walking as beneficial strategies.   Pt has agreed to visit the [facility] on Friday during walk-in hours for psychiatry assessment.   PLAN: 1. Follow up with behavioral health clinician on : Follow up with LCSW with any additional behavioral health and/or resource needs 2. Behavioral recommendations: Utilize strategies discussed and follow up with [facility] this week  3. Referral(s): [place] (In Clinic)  Rebekah Chesterfield, [place] 06/13/2020 8:50 AM

## 2020-06-20 ENCOUNTER — Telehealth: Payer: Self-pay

## 2020-06-20 NOTE — Telephone Encounter (Signed)
Copied from [place] [id number]. Topic: General - Other >> Jun 20, 2020 10:29 AM Oneta Rack wrote: Reason for CRM: patient would like a return call from PCP nurse, patient declined to share and states its personal best return call # [phone]

## 2020-06-20 NOTE — Telephone Encounter (Signed)
Made call to patient stated he would like to talk with Dr Joya Gaskins and get tested for STD. Offered mobile clinic today / stated out of town today he will return tomorrow  and can go to the  mobile unit . MD notified

## 2020-07-05 ENCOUNTER — Other Ambulatory Visit: Payer: Medicare Other

## 2020-07-06 ENCOUNTER — Other Ambulatory Visit: Payer: Self-pay

## 2020-07-06 ENCOUNTER — Other Ambulatory Visit: Payer: Medicare Other

## 2020-07-06 ENCOUNTER — Telehealth: Payer: Self-pay | Admitting: *Deleted

## 2020-07-06 DIAGNOSIS — Z20822 Contact with and (suspected) exposure to covid-19: Secondary | ICD-10-CM

## 2020-07-06 NOTE — Telephone Encounter (Signed)
Dr Ardis Hughs,  [facility] spt is a recall colon for you- he is scheduled for a procedure 9-10 Friday for a [facility] sister. His l;ast colon was 07-04-2015, was normal with a 5 yr recall for FH.  He has a hx of seizures, CA, HTN, COPD,CAD, Bipolar  He had some atypical CP in 02-2020- he followed up with Cardio 03-07-20, hadan Echo 03-22-20 that was normal, EF 50-55%, no AVS, he had a Zio Monitor x 14 days with NSR, Occ SVT, no A Fib, no arrythmias  Last Neuro notes 02-23-2020 states below  1.  History of seizures -Patient possibly had recent seizure 02/15/2020, remembers preseizure feeling, does not remember after that, does not want to elaborate on details, reports compliance with Keppra 750 mg BID -To be careful, no driving for 6 months, no operating heavy equipment for his job, we discussed this in detail for safety, he verbalized understanding -Increase Keppra 1000 mg twice a day -Call for recurrent seizure  There are no notes in Epic that pt has contacted Neuro  Is it ok to proceed with his Scheduled Colon?  Thanks, for your time, Lelan Pons

## 2020-07-07 ENCOUNTER — Telehealth: Payer: Self-pay

## 2020-07-07 LAB — NOVEL CORONAVIRUS, NAA: SARS-CoV-2, NAA: NOT DETECTED

## 2020-07-07 LAB — SARS-COV-2, NAA 2 DAY TAT

## 2020-07-07 NOTE — Telephone Encounter (Signed)
Pt called for results- advised pt that results are not back. Assisted pt with MyChart.

## 2020-07-09 NOTE — Telephone Encounter (Signed)
Yes, I think it should be safe for him to still have his colonoscopy in the [facility]

## 2020-07-11 ENCOUNTER — Telehealth: Payer: Self-pay | Admitting: Critical Care Medicine

## 2020-07-11 ENCOUNTER — Other Ambulatory Visit: Payer: Self-pay | Admitting: Critical Care Medicine

## 2020-07-11 ENCOUNTER — Ambulatory Visit: Payer: Self-pay | Admitting: *Deleted

## 2020-07-11 NOTE — Telephone Encounter (Signed)
Called patient and LVM informing him of a virtual appt for 07/12/20 @ 9am.

## 2020-07-11 NOTE — Telephone Encounter (Signed)
C/o lightheadedness, SOB, dizzy, pale, has nausea. Reports symptoms x 2 weeks. C/o productive cough yellow sputum. Has had fevers of 101.4 but better now. Reports covid test negative. C/o nausea and cool clammy skin. Instructed patient to go to ED and patient became very upset and irritable and requested only an appt at clinic. No available virtual visits at this time with any provider. Denies chest pain, blood in stool. Reports GF says he is pale in color. Reports he is eating and drinking plenty. Care advise given and patient became upset that ED will only send him home and to call PCP. Patient verbalized understanding of future appt with GI and neuro but only wants appt with PCP this week. Call placed to [facility] at [facility] for appt set.will promote use of Mobile Bus at [facility] for walk in.    Reason for Disposition  [1] MODERATE dizziness (e.g., interferes with normal activities) AND [2] has NOT been evaluated by physician for this  (Exception: dizziness caused by heat exposure, sudden standing, or poor fluid intake)  Answer Assessment - Initial Assessment Questions 1. DESCRIPTION: "Describe your dizziness."     Lightheaded when I get up and just sit here 2. LIGHTHEADED: "Do you feel lightheaded?" (e.g., somewhat faint, woozy, weak upon standing)     Yes SOB when I get up and dizzy I'm pale 3. VERTIGO: "Do you feel like either you or the room is spinning or tilting?" (i.e. vertigo)     na 4. SEVERITY: "How bad is it?"  "Do you feel like you are going to faint?" "Can you stand and walk?"   - MILD: Feels slightly dizzy, but walking normally.   - MODERATE: Feels very unsteady when walking, but not falling; interferes with normal activities (e.g., school, work) .   - SEVERE: Unable to walk without falling, or requires assistance to walk without falling; feels like passing out now.      moderate 5. ONSET:  "When did the dizziness begin?"     2 weeks ago 6. AGGRAVATING FACTORS: "Does  anything make it worse?" (e.g., standing, change in head position)     Standing up and anything I do 7. HEART RATE: "Can you tell me your heart rate?" "How many beats in 15 seconds?"  (Note: not all patients can do this)       No it goes up when I'm upset or having panic attacks and that gives me seizures 8. CAUSE: "What do you think is causing the dizziness?"     I dont know that's why im calling you 9. RECURRENT SYMPTOM: "Have you had dizziness before?" If Yes, ask: "When was the last time?" "What happened that time?"     Yes and this is worse 10. OTHER SYMPTOMS: "Do you have any other symptoms?" (e.g., fever, chest pain, vomiting, diarrhea, bleeding)       Had a fever better now, nausea  Protocols used: DIZZINESS Heidi Dach

## 2020-07-11 NOTE — Telephone Encounter (Signed)
Requested medication (s) are due for refill today: no  Requested medication (s) are on the active medication list: yes  Last refill:  01/18/2020  Future visit scheduled: no  Notes to clinic:  This refill cannot be delegated   Requested Prescriptions  Pending Prescriptions Disp Refills   ondansetron (ZOFRAN-ODT) 4 MG disintegrating tablet [Pharmacy Med Name: ONDANSETRON ODT 4MG  TABLETS] 12 tablet 0    Sig: DISSOLVE 1 TABLET(4 MG) ON THE TONGUE EVERY 8 HOURS AS NEEDED FOR NAUSEA OR VOMITING      Not Delegated - Gastroenterology: Antiemetics Failed - 07/11/2020 11:28 AM      Failed - This refill cannot be delegated      Passed - Valid encounter within last 6 months    Recent Outpatient Visits           3 months ago Coronary artery disease of native artery of native heart with stable angina pectoris [facility])   [place] Elsie Stain, MD   7 months ago COPD with chronic bronchitis [facility])   [place] Elsie Stain, MD   8 months ago Chronic insomnia   [place] Elsie Stain, MD   9 months ago MDD (major depressive disorder), recurrent severe, without psychosis [facility])   [place] Elsie Stain, MD   10 months ago Liver lesion, right lobe   [place], MD       Future Appointments             In 6 days Marlou Porch, Thana Farr, MD [place] [facility]

## 2020-07-11 NOTE — Telephone Encounter (Signed)
Please add the pt on for a virtual visit tomorrow.

## 2020-07-12 ENCOUNTER — Ambulatory Visit: Payer: Medicare Other | Attending: Critical Care Medicine | Admitting: Critical Care Medicine

## 2020-07-12 ENCOUNTER — Encounter: Payer: Self-pay | Admitting: Critical Care Medicine

## 2020-07-12 ENCOUNTER — Other Ambulatory Visit: Payer: Self-pay

## 2020-07-12 DIAGNOSIS — F332 Major depressive disorder, recurrent severe without psychotic features: Secondary | ICD-10-CM

## 2020-07-12 DIAGNOSIS — J449 Chronic obstructive pulmonary disease, unspecified: Secondary | ICD-10-CM | POA: Diagnosis not present

## 2020-07-12 DIAGNOSIS — Z8673 Personal history of transient ischemic attack (TIA), and cerebral infarction without residual deficits: Secondary | ICD-10-CM

## 2020-07-12 DIAGNOSIS — G43411 Hemiplegic migraine, intractable, with status migrainosus: Secondary | ICD-10-CM

## 2020-07-12 DIAGNOSIS — J209 Acute bronchitis, unspecified: Secondary | ICD-10-CM

## 2020-07-12 DIAGNOSIS — Z720 Tobacco use: Secondary | ICD-10-CM

## 2020-07-12 DIAGNOSIS — J440 Chronic obstructive pulmonary disease with acute lower respiratory infection: Secondary | ICD-10-CM | POA: Insufficient documentation

## 2020-07-12 DIAGNOSIS — I25118 Atherosclerotic heart disease of native coronary artery with other forms of angina pectoris: Secondary | ICD-10-CM

## 2020-07-12 DIAGNOSIS — I10 Essential (primary) hypertension: Secondary | ICD-10-CM | POA: Diagnosis not present

## 2020-07-12 MED ORDER — ONDANSETRON 4 MG PO TBDP: 4.0000 mg | ORAL_TABLET | Freq: Three times a day (TID) | ORAL | 0 refills | Status: DC | PRN

## 2020-07-12 MED ORDER — ALPRAZOLAM 0.25 MG PO TABS: 0.2500 mg | ORAL_TABLET | Freq: Three times a day (TID) | ORAL | 0 refills | Status: DC | PRN

## 2020-07-12 MED ORDER — AZITHROMYCIN 250 MG PO TABS: ORAL_TABLET | ORAL | 0 refills | Status: DC

## 2020-07-12 MED ORDER — DEXAMETHASONE 2 MG PO TABS: ORAL_TABLET | ORAL | 0 refills | Status: DC

## 2020-07-12 MED ORDER — PREDNISONE 10 MG PO TABS
ORAL_TABLET | ORAL | 0 refills | Status: DC
Start: 2020-07-12 — End: 2020-07-27

## 2020-07-12 NOTE — Assessment & Plan Note (Signed)
For the patient's recurrent migraines we gave him refills on his Zofran and also refills on the dexamethasone which is used on an as-needed basis for hemiplegic migraines

## 2020-07-12 NOTE — Assessment & Plan Note (Signed)
History of major depressive disorder will refill the Xanax 0.5 mg 3 times daily as needed and he is to follow-up with his mental health provider

## 2020-07-12 NOTE — Progress Notes (Signed)
Subjective:    Patient ID: Spencer Mendoza, male    DOB: 09/21/1961, 59 y.o.   MRN: [id number] Virtual Visit via Telephone Note  I connected with Spencer Mendoza on 07/12/20 at  9:00 AM EDT by telephone and verified that I am speaking with the correct person using two identifiers.   Consent:  I discussed the limitations, risks, security and privacy concerns of performing an evaluation and management service by telephone and the availability of in person appointments. I also discussed with the patient that there may be a patient responsible charge related to this service. The patient expressed understanding and agreed to proceed.  Location of patient: Patient was at home  Location of provider: I was in my office   Persons participating in the televisit with the patient.   No one else on the call  History of Present Illness: First OV 09/16/19 This is a 59 year old white male history of episodic seizures associated with tonic-clonic events and urinary incontinence.  History also of heavy ethanol use and severe generalized anxiety disorder with claustrophobia.  Patient has significant insomnia as well.  The patient has also chronic history of migraines been on multiple medications for this in the past as well.  Patient has a history of substance use including opioids in the past as well.  Patient also uses marijuana at this time.  Patient has history of bipolar disorder and severe depression.  Patient presents today post emergency room visit to establish for primary care.  The patient had been in this clinic previously 5 years ago and wishes to come back to the clinic now to establish.  There is also prior history of stroke.  Also history of nausea vomiting and diarrhea in the past.  The patient was seen in the emergency room on 24 September for similar complaints.  Below is the office visit with neurology on October 8  08/25/19 OV with Neurology Spencer Mendoza is a 59 year old right-handed  white male with a history of episodic seizures associated with generalized tonic-clonic events and occasional urinary incontinence.  The patient has had significant issues with alcohol abuse, he may drink up to 12 beers a day.  He has been in the hospital in June, and on 17 September with seizures, he was in the hospital on 11 August 2019 with gastroenteritis, a CT scan of the abdomen at that time showed a low-density area in the liver that was new, he will be evaluated for possible liver cancer in the near future.  The patient has a history of bipolar disorder, he is not followed through psychiatry.  He reports a lot of problems with claustrophobia and GAD.  He is not sleeping well, he indicates that severe insomnia may worsen his seizure control.  He is on trazodone 100 mg at night but this is not helpful.  The patient continues to have headaches almost daily or every other day, he may have a severe headache at least once a week.  He has been on a multitude of medications in the past for migraine including propranolol, amitriptyline, Topamax, Depakote, Effexor, gabapentin, Seroquel, Prozac, and Aimovig.  None of these modalities have been effective in controlling his headache.  The patient returns for an evaluation.   1.  History of seizures  2.  Alcohol abuse, marijuana abuse  3.  Chronic insomnia  4.  Chronic intractable migraine  5.  Anxiety, bipolar disorder  The patient will be increased on the trazodone to 150 mg at night,  he will call for any dose adjustments.  He will be set up for Botox therapy.  The patient will go up on the Keppra dosing to 750 mg twice daily.  He will follow-up here in 6 months, likely be seen sooner for Botox therapy.  Below is the emergency room visit September 24 In ED 9/24: Spencer Mendoza is a 59 y.o. male with history of bipolar, epilepsy, hypertension, migraine, CVA, substance abuse, EtOH abuse presents today for abdominal pain nausea vomiting diarrhea  and rash.  Patient reports that on Sunday evening he ate shrimp that he found in his refrigerator.  He reports that he was unaware of it at that time but shrimp had been in the refrigerator for greater than 12 days.  He reports that on Sunday night he developed abdominal pain and diarrhea.  He describes a generalized abdominal pain a severe cramping sensation constant without aggravating or alleviating symptoms or radiation.  Abdominal pain has been constant for the past 4 days.  Patient reports that on Monday morning he began taking a new medication prescribed by his neurologist, Lamictal for seizures in addition to his normal Keppra.  He reports that shortly after taking the Lamictal on Monday morning his diarrhea improved but was followed by nausea and vomiting.  He describes multiple episodes of nonbloody/nonbilious emesis over the past 3 days with continued abdominal pain.  Patient reports that on Tuesday the day after he began taking Lamictal he developed a diffuse red rash to his torso that has remained constant since that time.  Reports feeling warm and cold but denies measured fever at home.  Denies headache/vision changes, neck pain, difficulty swallowing, feeling of throat closing, burning of the mouth, chest pain/shortness of breath, dysuria/hematuria, fall/injury or any additional concerns.   On initial evaluation patient is overall well-appearing and in no acute distress, not actively vomiting.  He has mild abdominal tenderness generalized without peritoneal signs.  He has a diffuse mild erythematous rash of the anterior torso.  He has no airway or mucosal involvement.  Discussed case with Dr. Roslynn Amble, discussed possible etiologies such as squamoid poisoning, feel this is unlikely based on history and physical examination but will give Benadryl today.  He did begin taking Lamictal just prior to onset of his rash, review of notes from [facility] neurology show that they recommended that  he stop taking Lamictal today but continue with his Keppra. - Patient seen and evaluated by Dr. Roslynn Amble advises that if no acute findings on CT abdomen pelvis will discharge with Benadryl and outpatient follow-up. - CBC with extensions of 11.4 Lipase within normal limits CMP with anion gap 16, suspect secondary to dehydration Urinalysis with ketones and elevated specific gravity, suspect secondary to dehydration Ethanol negative, patient does not appear to be in alcohol withdrawal  CT abdomen pelvis:  IMPRESSION: No acute CT finding to account for abdominal pain.  There is a small hypodense/hypoenhancing lesion within segment 7 of the liver which was not present on the comparison noncontrast CT. The enhancement characteristics are suspicious, and while this could represent a vascular lesion, malignancy (either primary or metastatic) cannot be excluded. Nonemergent follow-up contrast-enhanced liver protocol MRI is recommended. These results were discussed by telephone at the time of interpretation on 08/11/2019 at 5:21 pm with Dr. Nuala Alpha.  Aortic Atherosclerosis (ICD10-I70.0). - Discussed case with in-house pharmacy who advises that patient take Benadryl 25 mg twice daily x7 days for his rash and discontinue lamotrigine, follow-up for recheck early next week. -  Patient reevaluated resting comfortably and in no acute distress.  On reevaluation abdomen is soft without peritoneal signs.  Patient has been updated on results today and he states understanding of need for follow-up.  He will be given referral to gastroenterologist for further evaluation encouraged to call office tomorrow morning.  He will be discharged with Zofran for nausea and vomiting.  Patient advised to stop taking lamotrigine immediately and to call his neurologist tomorrow morning to advise them of this.  He has Keppra at home that he will continue taking for his symptoms.  Patient does not appear to be having  Stevens-Johnson syndrome at this time, he is overall well-appearing and in no acute distress.  He is advised to take Benadryl 25 mg twice daily x7 days and to follow-up with neurologist and PCP on Monday.  Note the patient does need a follow-up MRI of the liver because of the liver lesion seen which could represent a malignancy or metastatic lesion.  Contrast-enhanced liver protocol MRI was recommended.  The patient now has been sober for 3 weeks.  Is not drinking alcohol currently.  This patient had been previously in mental health programs for drug substance use and depression.  He has been on multiple depression medications in the past.  He has been on trazodone for sleep in the past.  He also has been on Prozac.  He also used IV heroin in the past and also cocaine and Dilaudid in the past.  He does obtain off the street Xanax and uses this as needed at home.   10/03/2019 This patient is seen today in return follow-up from the mid October visit as documented above.  The patient currently is not drinking alcohol at this time.  He still suffers from significant depression and anxiety.  We tried sertraline on him 50 mg daily but he experienced erectile dysfunction and had to stop this medication.  He is continue the thiamine 496 mg daily folic acid 1 mg daily.  The patient is currently not on treatment for hypertension as his blood pressures been well controlled.  Note this patient underwent an MRI of the liver to look for any abnormalities in the liver when an abdominal CT scan suggested potential liver cancer.  The MRI in fact showed a benign hepatic hemangioma and nothing else.  I went over with the patient this result previously on the phone today we went over this visit the results as well face-to-face.  The patient states he still has significant anxiety and depression and has not been able to achieve a psychiatry appointment as of yet.  He did meet with the licensed Education officer, [employer]. The patient is  still smoking a pack a day of cigarettes.  He has not attempted to use nicotine to replace cigarettes as of yet.  He is maintaining his inhalers for COPD. Patient does complain of significant ongoing insomnia though he is on the trazodone 200 mg a day he gets him to sleep however he awakens in the middle the night with some choking sensations. The patient has completely stopped drinking alcohol  11/09/2019 This is a follow-up telephone visit for this patient who was last seen in November.  The patient has recently received Botox injections for his migraine syndrome and this is markedly improved his symptoms.  He maintains his seizure medications as well per neurology.  He is still smoking about half a pack a day of cigarettes.  He complains of increased cough increased sinus pressure increased sneezing low-grade  fever dizziness.  He was Covid + November 22 and at that time had more gastrointestinal symptoms.  He had a girlfriend was positive as well. He has yet to achieve a psychiatry visit for his major depressive disorder.  His insurance did not approve the referrals we made previously.  Also the patient continues with his insomnia and hypersomnolence during the day and is yet to achieve a sleep study due to barriers from [employer] as well.  He notes thick yellow mucus coming out of the nose at this time. The patient is using the Symbicort inhaler as prescribed   11/30/2019 The patient is seen today and is in very good spirits and in much improved mood and affect.  He states the trazodone has helped him at night during sleep.  He has seen a mental health provider at [facility] at this time and is awaiting further recommendations from [facility] on mental health therapy.  He has a follow-up visit with a therapist coming up soon.  The patient's migraine headaches are significantly improved and stable at this time.  In addition the patient has been abstinent from alcohol for over 9 weeks.  Patient  is still smoking small amounts and is interested in further smoking cessation assistance.  He does have good breathing at this time and is on the Symbicort.  There have been no additional seizures and he maintains on Keppra and as well the patient is on folic acid and thiamine while off alcohol   There has not been any additional abdominal pain since the last visit  03/20/2020 Patient seen in return follow-up with history of hypertension and now diagnosed calcium deposits in the coronaries as well as aortic atherosclerosis with 50% occlusion at the distal aorta.  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  Patient also had presyncopal episodes as well.  He continues have chronic insomnia however trazodone has been somewhat of help.  He is yet to have a sleep study as we have requested previously.  Patient is still smoking about a half a pack a day of cigarettes and we discussed the need to reduce smoking previously.  Patient states his mood disorder has stabilized and does have follow-up with his mental health provider. The patient did go to the emergency room when he had increased chest discomfort with negative troponins negative EKG however has since seen cardiology and they are scheduling him for coronary CT scan along with a long-term rhythm monitor.  The patient needs to be on statins but with multiple other issues going on at this time we have held off on this also he needs a Covid vaccine but again elected to hold off on this during his current symptom complex  07/12/2020 This patient was seen today in return follow-up by way of a telephone visit.  This patient's been noting increased cough shortness of breath and lightheadedness with temp up to 101 over the past week.  He went and had a Covid test which was negative.  He has had also some chest pain in the  front of his chest.  He is not using alcohol or cocaine he is reduced his marijuana intake because it irritates his breathing.  He is using his inhalers.  He smoking about 1/2 pack a day of cigarettes.  The cough is productive of thick yellow mucus with wheezing  associated  From a mental health perspective he has a follow-up appointment within the next month and is wishing to be bridged with Xanax until he gets in with the mental health provider.  He has been on Xanax in the past and this helps his panic attacks he takes 0.5 mg 1-2 times daily and only uses it as needed  The patient has a history of migraines but cannot afford the Botox is not taking this medication at this time  The patient's not had any recent seizures.  The patient had tried the Wellbutrin but did not help him and he is off this medication at this time hydroxyzine also was no help with his anxiety  He does maintain the Symbicort inhaler maintains Keppra Rova statin trazodone he is in need of refills on these medications Abdominal aortic atherosclerosis ([place]) with 50% occlusion distal aorta Patient has abdominal aortic atherosclerosis with 50% occlusion of the aorta not a good candidate for statins due to muscular side effects of other medications  Plan will be to refer patient to vascular surgery for further evaluation  CAD (coronary artery disease) Coronary artery disease with calcification the coronaries plan is to obtain coronary CT scanning and also rhythm study per cardiology further recommendations will follow  Hypertension Hypertension under good control at this time we will continue current medication profile  COPD with chronic bronchitis ([place]) COPD with ongoing tobacco use we discussed strategies to reduce tobacco use and will prescribe bupropion 150 mg twice daily and continue inhaled medications  Tobacco use    Past Medical History:  Diagnosis Date  . Alcohol withdrawal ([place]) 07/29/2013  . Allergy   .  Anxiety   . Benzodiazepine abuse ([place]) 08/30/2019  . Bipolar disorder ([place])   . Chronic insomnia 06/06/2015  . Chronic migraine without aura, with intractable migraine, so stated, with status migrainosus 08/25/2019  . Cocaine abuse ([place]) 05/22/2015  . Delirium tremens ([place]) 07/29/2013  . Depression   . Epilepsy ([place])   . Hemiplegic migraine with status migrainosus 06/06/2015  . Hypertension   . Migraine   . Opioid use disorder ([place]) 08/30/2019  . Stroke [facility])    patient denies  . Substance abuse ([place])      Family History  Problem Relation Age of Onset  . Heart disease Mother   . Stroke Mother   . Epilepsy Mother   . Heart disease Father   . Hypertension Father   . Melanoma Father   . Migraines Father   . Colon cancer Sister   . Colon cancer Cousin      Social History   Socioeconomic History  . Marital status: Legally Separated    Spouse name: Not on file  . Number of children: 0  . Years of education: GED  . Highest education level: Not on file  Occupational History  . Occupation: disabled  Tobacco Use  . Smoking status: Current Every Day Smoker    Packs/day: 0.50    Years: 15.00    Pack years: 7.50    Types: Cigarettes  . Smokeless tobacco: Never Used  Vaping Use  . Vaping Use: Never used  Substance and Sexual Activity  . Alcohol use: Yes    Alcohol/week: 6.0 standard drinks    Types: 6 Cans of beer per week    Comment: none since Sept 2020  . Drug use: Yes    Types: Marijuana    Comment: hx of crack, heroin; only THC since Sept 2020  . Sexual activity:  Yes    Birth control/protection: Condom  Other Topics Concern  . Not on file  Social History Narrative   Pt lives with sister.   Patient drinks 2 cups of caffeine daily.   Patient is right handed.   Social Determinants of Health   Financial Resource Strain:   . Difficulty of Paying Living Expenses: Not on file  Food Insecurity:   . Worried About Charity fundraiser in the Last Year: Not on file  .  Ran Out of Food in the Last Year: Not on file  Transportation Needs:   . Lack of Transportation (Medical): Not on file  . Lack of Transportation (Non-Medical): Not on file  Physical Activity:   . Days of Exercise per Week: Not on file  . Minutes of Exercise per Session: Not on file  Stress:   . Feeling of Stress : Not on file  Social Connections:   . Frequency of Communication with Friends and Family: Not on file  . Frequency of Social Gatherings with Friends and Family: Not on file  . Attends Religious Services: Not on file  . Active Member of Clubs or Organizations: Not on file  . Attends Archivist Meetings: Not on file  . Marital Status: Not on file  Intimate Partner Violence:   . Fear of Current or Ex-Partner: Not on file  . Emotionally Abused: Not on file  . Physically Abused: Not on file  . Sexually Abused: Not on file     Allergies  Allergen Reactions  . Sertraline Other (See Comments)    Erectile dysfunction  . Codeine Nausea And Vomiting  . Depakote [Divalproex Sodium] Other (See Comments)    Made patient shake  . Migranal [Dihydroergotamine] Nausea And Vomiting  . Topamax [Topiramate] Other (See Comments)    Made patient angry     Outpatient Medications Prior to Visit  Medication Sig Dispense Refill  . albuterol (VENTOLIN HFA) 108 (90 Base) MCG/ACT inhaler Inhale 2 puffs into the lungs every 4 (four) hours as needed for shortness of breath or wheezing. 18 g 1  . budesonide-formoterol (SYMBICORT) 160-4.5 MCG/ACT inhaler Inhale 2 puffs into the lungs 2 (two) times daily. 1 Inhaler 12  . folic acid (FOLVITE) 1 MG tablet Take 1 tablet (1 mg total) by mouth daily. 30 tablet 3  . levETIRAcetam (KEPPRA) 1000 MG tablet Take 1 tablet (1,000 mg total) by mouth 2 (two) times daily. 60 tablet 11  . rosuvastatin (CRESTOR) 10 MG tablet Take 1 tablet (10 mg total) by mouth daily. 90 tablet 3  . traZODone (DESYREL) 100 MG tablet Take 2 tablets (200 mg total) by mouth at  bedtime. 60 tablet 11  . dexamethasone (DECADRON) 2 MG tablet Keep on hand if needed for acute headache, take 3, then take 2, then take 1 6 tablet 0  . diphenhydrAMINE (BENADRYL) 25 MG tablet Take 1 tablet (25 mg total) by mouth 2 (two) times daily. 20 tablet 0  . ondansetron (ZOFRAN ODT) 4 MG disintegrating tablet Take 1 tablet (4 mg total) by mouth every 8 (eight) hours as needed for nausea or vomiting. 12 tablet 0  . hydrOXYzine (ATARAX/VISTARIL) 25 MG tablet Take 1 tablet (25 mg total) by mouth 3 (three) times daily as needed for anxiety. (Patient not taking: Reported on 07/12/2020) 30 tablet 0  . nicotine polacrilex (NICORETTE MINI) 4 MG lozenge Use one 4 times daily to stop smoking (Patient not taking: Reported on 03/07/2020) 100 tablet 4  . thiamine  100 MG tablet Take 1 tablet (100 mg total) by mouth daily. (Patient not taking: Reported on 07/12/2020) 30 tablet 3  . Botulinum Toxin Type A (BOTOX) 200 units SOLR Inject 1 vial as directed every 3 (three) months. (Patient not taking: Reported on 07/12/2020)    . buPROPion (WELLBUTRIN SR) 150 MG 12 hr tablet Take 1 tablet (150 mg total) by mouth 2 (two) times daily. (Patient not taking: Reported on 07/12/2020) 60 tablet 2   No facility-administered medications prior to visit.     Review of Systems Constitutional:    weight loss, night sweats,  Fevers, chills, fatigue, lassitude. HEENT:   No headaches,  Difficulty swallowing,  Tooth/dental problems,  Sore throat,               o sneezing, itching, ear ache, nasal congestion, post nasal drip,   CV:   chest pain,  Orthopnea, PND, swelling in lower extremities, anasarca, dizziness, palpitations  GI   heartburn, indigestion, abdominal pain, nausea, vomiting, diarrhea, change in bowel habits, loss of appetite  Resp:  shortness of breath with exertion or at rest.  No excess mucus, nproductive cough,  No non-productive cough,  No coughing up of blood.  No change in color of mucus.  No wheezing.  No  chest wall deformity  Skin: no rash or lesions.  GU: no dysuria, change in color of urine, no urgency or frequency.  No flank pain.  MS:  No joint pain or swelling.  No decreased range of motion.  No back pain. Claudication  Psych:   change in mood or affect.  depression or anxiety.  No memory loss.     Objective:   Physical Exam   There were no vitals filed for this visit. No exam this is a telephone visit   08/11/19 CT abdomen pelvis:   IMPRESSION: No acute CT finding to account for abdominal pain.  Hepatobiliary: Rounded hypodense lesion in segment 7, image 12 series 3. Lesion measures 16 mm in greatest diameter, and was not present or visualized on the prior noncontrast CT. There is differential enhancement pattern with less distinct border on the delayed CT images.  There is a small hypodense/hypoenhancing lesion within segment 7 of the liver which was not present on the comparison noncontrast CT. The enhancement characteristics are suspicious, and while this could represent a vascular lesion, malignancy (either primary or metastatic) cannot be excluded. Nonemergent follow-up contrast-enhanced liver protocol MRI is recommended. These results were discussed by telephone at the time of interpretation on  MRI 09/20/19:  IMPRESSION: 1. 16 mm segment 7 liver lesion has MR imaging characteristics of a benign hepatic hemangioma. 2. No other liver lesions are identified. No intra or extrahepatic biliary dilatation. 3. No acute abdominal findings and no adenopathy.  Recent CT Angio chest noted    Assessment & Plan:  I personally reviewed all images and lab data in the [facility] system as well as any outside material available during this office visit and agree with the  radiology impressions.   COPD with acute bronchitis ([place]) Probable acute COPD exacerbation with acute febrile tracheobronchitis  We will need to obtain chest x-ray  Plan will be to administer  azithromycin for 5-day course prednisone pulse 40 mg daily for 5 days continue inhaled medications  Hemiplegic migraine with status migrainosus For the patient's recurrent migraines we gave him refills on his Zofran and also refills on the dexamethasone which is used on an as-needed basis for hemiplegic migraines  Hypertension Patient  not currently on blood pressure medications we will monitor  MDD (major depressive disorder), recurrent severe, without psychosis ([place]) History of major depressive disorder will refill the Xanax 0.5 mg 3 times daily as needed and he is to follow-up with his mental health provider  Tobacco use Smoking continue about a half a pack a day will prescribe nicotine replacement therapy for the patient     . Current smoking consumption amount: 1/2 pack a day  . Dicsussion on advise to quit smoking and smoking impacts: Effects on neurologic cardiovascular lung health  . Patient's willingness to quit: Willing to try to quit  . Methods to quit smoking discussed: Nicotine replacement therapy  . Medication management of smoking session drugs discussed: Nicotine replacement therapy  .   [name] Setting quit date no quit date set  . Follow-up arranged 1 month follow-up   Time spent counseling the patient: 5 minutes    Hiroto was seen today for cough and shortness of breath.  Diagnoses and all orders for this visit:  Essential hypertension -     Comprehensive metabolic panel; Future  COPD with chronic bronchitis (HCC) -     CBC with Differential/Platelet; Future -     DG Chest 2 View; Future  COPD with acute bronchitis (HCC) -     CBC with Differential/Platelet; Future -     DG Chest 2 View; Future  History of stroke -     Lipid panel; Future  Coronary artery disease of native artery of native heart with stable angina pectoris ([place]) -     Lipid panel; Future  Intractable hemiplegic migraine with status migrainosus  MDD (major depressive disorder),  recurrent severe, without psychosis ([place])  Tobacco use  Other orders -     dexamethasone (DECADRON) 2 MG tablet; Keep on hand if needed for acute headache, take 3, then take 2, then take 1 -     ondansetron (ZOFRAN ODT) 4 MG disintegrating tablet; Take 1 tablet (4 mg total) by mouth every 8 (eight) hours as needed for nausea or vomiting. -     ALPRAZolam (XANAX) 0.25 MG tablet; Take 1 tablet (0.25 mg total) by mouth 3 (three) times daily as needed for anxiety. -     azithromycin (ZITHROMAX) 250 MG tablet; Take two once then one daily until gone -     predniSONE (DELTASONE) 10 MG tablet; Take 4 tablets daily for 5 days then stop    Follow Up Instructions:   The patient knows a follow-up in office exam will be obtained within the next month The patient will come to the office for a chest x-ray metabolic panel complete blood count and lipid panel I discussed the assessment and treatment plan with the patient. The patient was provided an opportunity to ask questions and all were answered. The patient agreed with the plan and demonstrated an understanding of the instructions.   The patient was advised to call back or seek an in-person evaluation if the symptoms worsen or if the condition fails to improve as anticipated.  I provided 30  minutes of non-face-to-face time during this encounter  including  median intraservice time , review of notes, labs, imaging, medications  and explaining diagnosis and management to the patient .    Asencion Noble, MD

## 2020-07-12 NOTE — Assessment & Plan Note (Signed)
Probable acute COPD exacerbation with acute febrile tracheobronchitis  We will need to obtain chest x-ray  Plan will be to administer azithromycin for 5-day course prednisone pulse 40 mg daily for 5 days continue inhaled medications

## 2020-07-12 NOTE — Assessment & Plan Note (Signed)
Smoking continue about a half a pack a day will prescribe nicotine replacement therapy for the patient     . Current smoking consumption amount: 1/2 pack a day  . Dicsussion on advise to quit smoking and smoking impacts: Effects on neurologic cardiovascular lung health  . Patient's willingness to quit: Willing to try to quit  . Methods to quit smoking discussed: Nicotine replacement therapy  . Medication management of smoking session drugs discussed: Nicotine replacement therapy  .   . Setting quit date no quit date set  . Follow-up arranged 1 month follow-up   Time spent counseling the patient: 5 minutes  

## 2020-07-12 NOTE — Progress Notes (Signed)
States that he has a cough and has trouble breathing. States that he get dizzy and lightheaded.

## 2020-07-12 NOTE — Assessment & Plan Note (Signed)
Patient not currently on blood pressure medications we will monitor

## 2020-07-13 ENCOUNTER — Other Ambulatory Visit: Payer: Self-pay

## 2020-07-13 ENCOUNTER — Ambulatory Visit: Payer: Medicare Other | Attending: Critical Care Medicine

## 2020-07-13 DIAGNOSIS — J440 Chronic obstructive pulmonary disease with acute lower respiratory infection: Secondary | ICD-10-CM

## 2020-07-13 DIAGNOSIS — Z8673 Personal history of transient ischemic attack (TIA), and cerebral infarction without residual deficits: Secondary | ICD-10-CM

## 2020-07-13 DIAGNOSIS — J449 Chronic obstructive pulmonary disease, unspecified: Secondary | ICD-10-CM

## 2020-07-13 DIAGNOSIS — I25118 Atherosclerotic heart disease of native coronary artery with other forms of angina pectoris: Secondary | ICD-10-CM

## 2020-07-13 DIAGNOSIS — I10 Essential (primary) hypertension: Secondary | ICD-10-CM

## 2020-07-14 LAB — COMPREHENSIVE METABOLIC PANEL
ALT: 24 IU/L (ref 0–44)
AST: 22 IU/L (ref 0–40)
Albumin/Globulin Ratio: 2.5 — ABNORMAL HIGH (ref 1.2–2.2)
Albumin: 5 g/dL — ABNORMAL HIGH (ref 3.8–4.8)
Alkaline Phosphatase: 79 IU/L (ref 48–121)
BUN/Creatinine Ratio: 11 (ref 10–24)
BUN: 11 mg/dL (ref 8–27)
Bilirubin Total: 0.5 mg/dL (ref 0.0–1.2)
CO2: 25 mmol/L (ref 20–29)
Calcium: 10.4 mg/dL — ABNORMAL HIGH (ref 8.6–10.2)
Chloride: 97 mmol/L (ref 96–106)
Creatinine, Ser: 1.02 mg/dL (ref 0.76–1.27)
GFR calc Af Amer: 91 mL/min/{1.73_m2} (ref >59)
GFR calc non Af Amer: 79 mL/min/{1.73_m2} (ref >59)
Globulin, Total: 2 g/dL (ref 1.5–4.5)
Glucose: 88 mg/dL (ref 65–99)
Potassium: 4.4 mmol/L (ref 3.5–5.2)
Sodium: 139 mmol/L (ref 134–144)
Total Protein: 7 g/dL (ref 6.0–8.5)

## 2020-07-14 LAB — CBC WITH DIFFERENTIAL/PLATELET
Basophils Absolute: 0.1 10*3/uL (ref 0.0–0.2)
Basos: 1 %
EOS (ABSOLUTE): 0.1 10*3/uL (ref 0.0–0.4)
Eos: 1 %
Hematocrit: 47.3 % (ref 37.5–51.0)
Hemoglobin: 16.3 g/dL (ref 13.0–17.7)
Immature Grans (Abs): 0 10*3/uL (ref 0.0–0.1)
Immature Granulocytes: 0 %
Lymphocytes Absolute: 1.6 10*3/uL (ref 0.7–3.1)
Lymphs: 15 %
MCH: 32.3 pg (ref 26.6–33.0)
MCHC: 34.5 g/dL (ref 31.5–35.7)
MCV: 94 fL (ref 79–97)
Monocytes Absolute: 1.1 10*3/uL — ABNORMAL HIGH (ref 0.1–0.9)
Monocytes: 11 %
Neutrophils Absolute: 7.9 10*3/uL — ABNORMAL HIGH (ref 1.4–7.0)
Neutrophils: 72 %
Platelets: 312 10*3/uL (ref 150–450)
RBC: 5.05 x10E6/uL (ref 4.14–5.80)
RDW: 12.7 % (ref 11.6–15.4)
WBC: 10.8 10*3/uL (ref 3.4–10.8)

## 2020-07-14 LAB — LIPID PANEL
Chol/HDL Ratio: 1.6 ratio (ref 0.0–5.0)
Cholesterol, Total: 163 mg/dL (ref 100–199)
HDL: 101 mg/dL (ref >39)
LDL Chol Calc (NIH): 50 mg/dL (ref 0–99)
Triglycerides: 58 mg/dL (ref 0–149)
VLDL Cholesterol Cal: 12 mg/dL (ref 5–40)

## 2020-07-17 ENCOUNTER — Other Ambulatory Visit: Payer: Self-pay

## 2020-07-17 ENCOUNTER — Encounter: Payer: Self-pay | Admitting: Cardiology

## 2020-07-17 ENCOUNTER — Ambulatory Visit (INDEPENDENT_AMBULATORY_CARE_PROVIDER_SITE_OTHER): Payer: Medicare Other | Admitting: Cardiology

## 2020-07-17 VITALS — BP 130/80 | HR 64 | Ht 72.0 in | Wt 147.4 lb

## 2020-07-17 DIAGNOSIS — I10 Essential (primary) hypertension: Secondary | ICD-10-CM | POA: Diagnosis not present

## 2020-07-17 DIAGNOSIS — I25118 Atherosclerotic heart disease of native coronary artery with other forms of angina pectoris: Secondary | ICD-10-CM

## 2020-07-17 DIAGNOSIS — R55 Syncope and collapse: Secondary | ICD-10-CM

## 2020-07-17 MED ORDER — ASPIRIN EC 81 MG PO TBEC: 81.0000 mg | DELAYED_RELEASE_TABLET | Freq: Every day | ORAL | 11 refills | Status: DC

## 2020-07-17 NOTE — Patient Instructions (Signed)
Medication Instructions:  Please start Aspirin 81 mg a day.  Continue all other medications as listed.  *If you need a refill on your cardiac medications before your next appointment, please call your pharmacy*  Follow-Up: At [facility], you and your health needs are our priority.  As part of our continuing mission to provide you with exceptional heart care, we have created designated Provider Care Teams.  These Care Teams include your primary Cardiologist (physician) and Advanced Practice Providers (APPs -  Physician Assistants and Nurse Practitioners) who all work together to provide you with the care you need, when you need it.  We recommend signing up for the patient portal called "MyChart".  Sign up information is provided on this After Visit Summary.  MyChart is used to connect with patients for Virtual Visits (Telemedicine).  Patients are able to view lab/test results, encounter notes, upcoming appointments, etc.  Non-urgent messages can be sent to your provider as well.   To learn more about what you can do with MyChart, go to [email or web address].    Your next appointment:   12 month(s)  The format for your next appointment:   In Person  Provider:   Candee Furbish, MD   Thank you for choosing [facility]!!

## 2020-07-17 NOTE — Progress Notes (Signed)
Cardiology Office Note:    Date:  07/17/2020   ID:  Spencer Mendoza, DOB [birth year], MRN [id number]  PCP:  Elsie Stain, MD  [facility] Cardiologist:  Candee Furbish, MD  [facility] Electrophysiologist:  None   Referring MD: Elsie Stain, MD    History of Present Illness:    Spencer Mendoza is a 59 y.o. male here for the follow-up of atypical chest pain, peripheral vascular disease with stenosis of inferior mesenteric artery bilateral iliac artery and femoral artery moderate stenosis.  He has had issues with bipolar disorder, chronic migraine, alcohol use.  Had unusual symptoms where he would lose consciousness at times felt like he could not stand pain radiating to both arms multiple episodes of vomiting sinus tachycardia.  CT scan has shown emphysema no evidence of aortopathy.  Has had vagal-like syncope in the past.  Ongoing chest discomfort.  Anxiety.  Past Medical History:  Diagnosis Date  . Alcohol withdrawal ([place]) 07/29/2013  . Allergy   . Anxiety   . Benzodiazepine abuse ([place]) 08/30/2019  . Bipolar disorder ([place])   . Chronic insomnia 06/06/2015  . Chronic migraine without aura, with intractable migraine, so stated, with status migrainosus 08/25/2019  . Cocaine abuse ([place]) 05/22/2015  . Delirium tremens ([place]) 07/29/2013  . Depression   . Epilepsy ([place])   . Hemiplegic migraine with status migrainosus 06/06/2015  . Hypertension   . Migraine   . Opioid use disorder ([place]) 08/30/2019  . Stroke [facility])    patient denies  . Substance abuse [facility])     Past Surgical History:  Procedure Laterality Date  . APPENDECTOMY    . KNEE ARTHROSCOPY Right   . NOSE SURGERY      Current Medications: Current Meds  Medication Sig  . albuterol (VENTOLIN HFA) 108 (90 Base) MCG/ACT inhaler Inhale 2 puffs into the lungs every 4 (four) hours as needed for shortness of breath or wheezing.  [name] ALPRAZolam (XANAX) 0.25 MG tablet Take 1 tablet (0.25 mg total) by mouth 3 (three) times  daily as needed for anxiety.  . budesonide-formoterol (SYMBICORT) 160-4.5 MCG/ACT inhaler Inhale 2 puffs into the lungs 2 (two) times daily.  [name] dexamethasone (DECADRON) 2 MG tablet Keep on hand if needed for acute headache, take 3, then take 2, then take 1  . folic acid (FOLVITE) 1 MG tablet Take 1 tablet (1 mg total) by mouth daily.  . hydrOXYzine (ATARAX/VISTARIL) 25 MG tablet Take 1 tablet (25 mg total) by mouth 3 (three) times daily as needed for anxiety.  . levETIRAcetam (KEPPRA) 1000 MG tablet Take 1 tablet (1,000 mg total) by mouth 2 (two) times daily.  . nicotine polacrilex (NICORETTE MINI) 4 MG lozenge Use one 4 times daily to stop smoking  . ondansetron (ZOFRAN ODT) 4 MG disintegrating tablet Take 1 tablet (4 mg total) by mouth every 8 (eight) hours as needed for nausea or vomiting.  . predniSONE (DELTASONE) 10 MG tablet Take 4 tablets daily for 5 days then stop  . rosuvastatin (CRESTOR) 10 MG tablet Take 1 tablet (10 mg total) by mouth daily.  [name] thiamine 100 MG tablet Take 1 tablet (100 mg total) by mouth daily.  . traZODone (DESYREL) 100 MG tablet Take 2 tablets (200 mg total) by mouth at bedtime.     Allergies:   Sertraline, Codeine, Depakote [divalproex sodium], Migranal [dihydroergotamine], and Topamax [topiramate]   Social History   Socioeconomic History  . Marital status: Legally Separated    Spouse name:  Not on file  . Number of children: 0  . Years of education: GED  . Highest education level: Not on file  Occupational History  . Occupation: disabled  Tobacco Use  . Smoking status: Current Every Day Smoker    Packs/day: 0.50    Years: 15.00    Pack years: 7.50    Types: Cigarettes  . Smokeless tobacco: Never Used  Vaping Use  . Vaping Use: Never used  Substance and Sexual Activity  . Alcohol use: Yes    Alcohol/week: 6.0 standard drinks    Types: 6 Cans of beer per week    Comment: none since Sept 2020  . Drug use: Yes    Types: Marijuana    Comment: hx of  crack, heroin; only THC since Sept 2020  . Sexual activity: Yes    Birth control/protection: Condom  Other Topics Concern  . Not on file  Social History Narrative   Pt lives with sister.   Patient drinks 2 cups of caffeine daily.   Patient is right handed.   Social Determinants of Health   Financial Resource Strain:   . Difficulty of Paying Living Expenses: Not on file  Food Insecurity:   . Worried About Charity fundraiser in the Last Year: Not on file  . Ran Out of Food in the Last Year: Not on file  Transportation Needs:   . Lack of Transportation (Medical): Not on file  . Lack of Transportation (Non-Medical): Not on file  Physical Activity:   . Days of Exercise per Week: Not on file  . Minutes of Exercise per Session: Not on file  Stress:   . Feeling of Stress : Not on file  Social Connections:   . Frequency of Communication with Friends and Family: Not on file  . Frequency of Social Gatherings with Friends and Family: Not on file  . Attends Religious Services: Not on file  . Active Member of Clubs or Organizations: Not on file  . Attends Archivist Meetings: Not on file  . Marital Status: Not on file     Family History: The patient's family history includes Colon cancer in his cousin and sister; Epilepsy in his mother; Heart disease in his father and mother; Hypertension in his father; Melanoma in his father; Migraines in his father; Stroke in his mother.  ROS:   Please see the history of present illness.     All other systems reviewed and are negative.  EKGs/Labs/Other Studies Reviewed:    The following studies were reviewed today:  ZIO monitor 04/23/2020:   Sinus rhythm  Rare junctional rhythm tachycardia  Occasoinal SVT, Paroxysmal atrial tachycardia.  No AFIB  No pauses  No adverse arrhythmias  Coronary CT scan 03/29/2020: 2. Coronary calcium score of 330. This was 94th percentile for age and sex matched control.  2.  Normal coronary  origin with right dominance.  3. Moderate plaque in the mid LAD with mild plaque in the RCA. Given the motion artifact this study is not a good candidate for FFRct.  4. Recommend aggressive risk factor modification including high potency statin. If symptoms persist, would have a low threshold to consider cardiac catheterization.  1. Normal FFR through coronary arteries (lowest FFR 0.90 in distal segments).  IMPRESSION: Normal FFR. No evidence of flow limiting CAD. Continue with medical management.  Candee Furbish, MD [facility]   EKG: Sinus rhythm 53  Recent Labs: 03/07/2020: TSH 3.310 07/13/2020: ALT 24; BUN 11; Creatinine, Ser 1.02;  Hemoglobin 16.3; Platelets 312; Potassium 4.4; Sodium 139  Recent Lipid Panel    Component Value Date/Time   CHOL 163 07/13/2020 0921   TRIG 58 07/13/2020 0921   HDL 101 07/13/2020 0921   CHOLHDL 1.6 07/13/2020 0921   CHOLHDL 3.6 02/01/2018 0617   VLDL 46 (H) 02/01/2018 0617   LDLCALC 50 07/13/2020 0921    Physical Exam:    VS:  BP 130/80   Pulse 64   Ht 6' (1.829 m)   Wt 147 lb 6.4 oz (66.9 kg)   SpO2 98%   BMI 19.99 kg/m     Wt Readings from Last 3 Encounters:  07/17/20 147 lb 6.4 oz (66.9 kg)  04/04/20 157 lb (71.2 kg)  03/20/20 158 lb (71.7 kg)     GEN:  Well nourished, well developed in no acute distress HEENT: Normal NECK: No JVD; No carotid bruits LYMPHATICS: No lymphadenopathy CARDIAC: RRR, no murmurs, rubs, gallops RESPIRATORY:  Clear to auscultation without rales, wheezing or rhonchi  ABDOMEN: Soft, non-tender, non-distended MUSCULOSKELETAL:  No edema; No deformity  SKIN: Warm and dry NEUROLOGIC:  Alert and oriented x 3 PSYCHIATRIC:  Normal affect   ASSESSMENT:    1. Coronary artery disease of native artery of native heart with stable angina pectoris (HCC)   2. Vasovagal syncope   3. Essential hypertension    PLAN:    In order of problems listed above:  Coronary artery disease -Nonflow limiting from coronary  CT scan with normal FFR analysis.  Coronary calcium score in the 300 range.  Continue with aggressive risk factor modification.  The level or degree of CAD should not cause any symptoms.  Vasovagal Syncope -Overall reassuring monitoring.  Likely vasovagal.  White as a sheet, dizzy, lightheaded. 2-5 min. Noncardiac cause. Partially orthostatic.  Discussed hydration, Gatorade, compression stockings during the wintertime especially.  Discussed laying down with feet up when first of prodrome occurs.  Bipolar disorder/seizures -Seen by neurology.  Hyperlipidemia -Lipid panel shows LDL of 50 HDL of 101.  Excellent.  Continue with Crestor 10 mg.  Peripheral arterial disease -He is seen Dr. Scot Dock with vascular surgery.  He will continue to follow with ABIs.  No procedure needed.  He did not think that his occasional abdominal pain is related to mesenteric ischemia.  Follow-up 1 year.  Medication Adjustments/Labs and Tests Ordered: Current medicines are reviewed at length with the patient today.  Concerns regarding medicines are outlined above.  No orders of the defined types were placed in this encounter.  Meds ordered this encounter  Medications  . aspirin EC 81 MG tablet    Sig: Take 1 tablet (81 mg total) by mouth daily. Swallow whole.    Dispense:  30 tablet    Refill:  11    Patient Instructions  Medication Instructions:  Please start Aspirin 81 mg a day.  Continue all other medications as listed.  *If you need a refill on your cardiac medications before your next appointment, please call your pharmacy*  Follow-Up: At [facility], you and your health needs are our priority.  As part of our continuing mission to provide you with exceptional heart care, we have created designated Provider Care Teams.  These Care Teams include your primary Cardiologist (physician) and Advanced Practice Providers (APPs -  Physician Assistants and Nurse Practitioners) who all work together to provide  you with the care you need, when you need it.  We recommend signing up for the patient portal called "MyChart".  Sign  up information is provided on this After Visit Summary.  MyChart is used to connect with patients for Virtual Visits (Telemedicine).  Patients are able to view lab/test results, encounter notes, upcoming appointments, etc.  Non-urgent messages can be sent to your provider as well.   To learn more about what you can do with MyChart, go to [email or web address].    Your next appointment:   12 month(s)  The format for your next appointment:   In Person  Provider:   Candee Furbish, MD   Thank you for choosing [facility]!!         Signed, Candee Furbish, MD  07/17/2020 9:40 AM    [place]

## 2020-07-20 ENCOUNTER — Encounter: Payer: Self-pay | Admitting: Gastroenterology

## 2020-07-20 ENCOUNTER — Ambulatory Visit (AMBULATORY_SURGERY_CENTER): Payer: Self-pay | Admitting: *Deleted

## 2020-07-20 ENCOUNTER — Telehealth: Payer: Self-pay | Admitting: *Deleted

## 2020-07-20 ENCOUNTER — Other Ambulatory Visit: Payer: Self-pay

## 2020-07-20 VITALS — Ht 72.0 in | Wt 149.0 lb

## 2020-07-20 DIAGNOSIS — Z800 Family history of malignant neoplasm of digestive organs: Secondary | ICD-10-CM

## 2020-07-20 MED ORDER — PEG 3350-KCL-NA BICARB-NACL 420 G PO SOLR: 4000.0000 mL | Freq: Once | ORAL | 0 refills | Status: AC

## 2020-07-20 NOTE — Progress Notes (Signed)
No egg or soy allergy known to patient   issues with past sedation with any surgeries or procedures- truble waking post op- and some PONV with general anesthesia  no intubation problems in the past  No FH of Malignant Hyperthermia No diet pills per patient No home 02 use per patient  No blood thinners per patient  Pt denies issues with constipation  No A fib or A flutter  EMMI video to pt or via [place] 19 guidelines implemented in PV today with Pt and RN   I saw this pt in [place] today- he has a hx of seizures- he told me about 2-21/2 weeks ago he was having some pretty severe panic attacks that caused grand mal seizures and several over a week or more-  He said the seizures were reoccuring -  he has since been started on Xanax and since had no panic attacks, no seizures-   he saw cardiology 8-31 for episodes of feeling dizzy, becoming White, pale- this is note from Cardio  VasovagalSyncope -Overall reassuring monitoring. Likely vasovagal. White as a sheet, dizzy, lightheaded. 2-5 min.Noncardiac cause.Partially orthostatic.Discussed hydration, Gatorade, compression stockings during the wintertime especially. Discussed laying down with feet up when first of prodrome occurs.  Pt states he has lost well over 40 lbs in the 5-6 months- no reason, not trying to loose weight -denies rectal bleeding, no abd pain- he has a hard time with passing stools - no producing a lot of stool, not like constipated   TE To Dr Ardis Hughs  Is it ok to proceed with his colon 9-10 Friday or does he need an OV     Due to the COVID-19 pandemic we are asking patients to follow these guidelines. Please only bring one care partner. Please be aware that your care partner may wait in the car in the parking lot or if they feel like they will be too hot to wait in the car, they may wait in the lobby on the 4th floor. All care partners are required to wear a mask the entire time (we do not have any that we can  provide them), they need to practice social distancing, and we will do a Covid check for all patient's and care partners when you arrive. Also we will check their temperature and your temperature. If the care partner waits in their car they need to stay in the parking lot the entire time and we will call them on their cell phone when the patient is ready for discharge so they can bring the car to the front of the building. Also all patient's will need to wear a mask into building.

## 2020-07-20 NOTE — Telephone Encounter (Signed)
Dr Ardis Hughs,  I saw this pt in [place] today- he has a hx of seizures- he told me about 2-21/2 weeks ago he was having some pretty severe panic attacks that caused grand mal seizures and several over a week or more-  He said the seizures were reoccuring -  he has since been started on Xanax and since had no panic attacks, no seizures-   he saw cardiology 8-31 for episodes of feeling dizzy, becoming White, pale- this is note from Cardio  Vasovagal Syncope -Overall reassuring monitoring.  Likely vasovagal.  White as a sheet, dizzy, lightheaded. 2-5 min. Noncardiac cause. Partially orthostatic.  Discussed hydration, Gatorade, compression stockings during the wintertime especially.  Discussed laying down with feet up when first of prodrome occurs.  Pt states he has lost well over 40 lbs in the 5-6 months- no reason, not trying to loose weight -denies rectal bleeding, no abd pain- he has a hard time with passing stools - no producing a lot of stool, not like constipated    Is it ok to proceed with his colon 9-10 Friday or does he need an OV    Please advise, Thanks, Lelan Pons PV

## 2020-07-24 NOTE — Telephone Encounter (Signed)
Pt informed proceed as scheduled

## 2020-07-24 NOTE — Telephone Encounter (Signed)
OK to continue with plans for [facility] this Friday.  thanks

## 2020-07-25 ENCOUNTER — Ambulatory Visit (INDEPENDENT_AMBULATORY_CARE_PROVIDER_SITE_OTHER): Payer: Medicare Other

## 2020-07-25 ENCOUNTER — Other Ambulatory Visit: Payer: Self-pay | Admitting: Gastroenterology

## 2020-07-25 DIAGNOSIS — Z1159 Encounter for screening for other viral diseases: Secondary | ICD-10-CM

## 2020-07-25 LAB — SARS CORONAVIRUS 2 (TAT 6-24 HRS): SARS Coronavirus 2: NEGATIVE

## 2020-07-27 ENCOUNTER — Other Ambulatory Visit: Payer: Self-pay

## 2020-07-27 ENCOUNTER — Ambulatory Visit (AMBULATORY_SURGERY_CENTER): Payer: Medicare Other | Admitting: Gastroenterology

## 2020-07-27 ENCOUNTER — Other Ambulatory Visit: Payer: Self-pay | Admitting: Gastroenterology

## 2020-07-27 ENCOUNTER — Encounter: Payer: Self-pay | Admitting: Gastroenterology

## 2020-07-27 VITALS — BP 98/59 | HR 57 | Temp 97.5°F | Resp 15 | Ht 72.0 in | Wt 149.0 lb

## 2020-07-27 DIAGNOSIS — D125 Benign neoplasm of sigmoid colon: Secondary | ICD-10-CM | POA: Diagnosis not present

## 2020-07-27 DIAGNOSIS — D124 Benign neoplasm of descending colon: Secondary | ICD-10-CM | POA: Diagnosis not present

## 2020-07-27 DIAGNOSIS — Z1211 Encounter for screening for malignant neoplasm of colon: Secondary | ICD-10-CM | POA: Diagnosis not present

## 2020-07-27 DIAGNOSIS — Z800 Family history of malignant neoplasm of digestive organs: Secondary | ICD-10-CM | POA: Diagnosis not present

## 2020-07-27 MED ORDER — SODIUM CHLORIDE 0.9 % IV SOLN: 500.0000 mL | INTRAVENOUS | Status: DC

## 2020-07-27 NOTE — Progress Notes (Signed)
Pt Drowsy. VSS. To PACU, report to RN. No anesthetic complications noted. bovie site clear 

## 2020-07-27 NOTE — Progress Notes (Signed)
Called to room to assist during endoscopic procedure.  Patient ID and intended procedure confirmed with present staff. Received instructions for my participation in the procedure from the performing physician.  

## 2020-07-27 NOTE — Progress Notes (Signed)
Pt's states no medical or surgical changes since previsit or office visit. 

## 2020-07-27 NOTE — Op Note (Signed)
[facility] Patient Name: Spencer Mendoza Procedure Date: 07/27/2020 10:37 AM MRN: [id number] Endoscopist: Milus Banister , MD Age: 59 Referring MD:  Date of Birth: [birth year] Gender: Male Account #: [id number] Procedure:                Colonoscopy Indications:              Screening in patient at increased risk: Family                            history of 1st-degree relative with colorectal                            cancer before age 72 years (sister) and also a                            cousin Medicines:                Monitored Anesthesia Care Procedure:                Pre-Anesthesia Assessment:                           - Prior to the procedure, a History and Physical                            was performed, and patient medications and                            allergies were reviewed. The patient's tolerance of                            previous anesthesia was also reviewed. The risks                            and benefits of the procedure and the sedation                            options and risks were discussed with the patient.                            All questions were answered, and informed consent                            was obtained. Prior Anticoagulants: The patient has                            taken no previous anticoagulant or antiplatelet                            agents. ASA Grade Assessment: II - A patient with                            mild systemic disease. After reviewing the risks  and benefits, the patient was deemed in                            satisfactory condition to undergo the procedure.                           After obtaining informed consent, the colonoscope                            was passed under direct vision. Throughout the                            procedure, the patient's blood pressure, pulse, and                            oxygen saturations were monitored continuously. The                             Colonoscope was introduced through the anus and                            advanced to the the cecum, identified by                            appendiceal orifice and ileocecal valve. The                            colonoscopy was performed without difficulty. The                            patient tolerated the procedure well. The quality                            of the bowel preparation was excellent. The                            ileocecal valve, appendiceal orifice, and rectum                            were photographed. Scope In: 10:52:11 AM Scope Out: 11:09:22 AM Scope Withdrawal Time: 0 hours 11 minutes 11 seconds  Total Procedure Duration: 0 hours 17 minutes 11 seconds  Findings:                 A 7 mm polyp was found in the descending colon. The                            polyp was sessile. The polyp was removed with a                            cold snare. Resection and retrieval were complete.                           A 14 mm polyp was found in the sigmoid colon.  The                            polyp was semi-pedunculated. The polyp was removed                            with a hot snare. Resection and retrieval were                            complete.                           The exam was otherwise without abnormality on                            direct and retroflexion views. Complications:            No immediate complications. Estimated blood loss:                            None. Estimated Blood Loss:     Estimated blood loss: none. Impression:               - One 7 mm polyp in the descending colon, removed                            with a cold snare. Resected and retrieved.                           - One 14 mm polyp in the sigmoid colon, removed                            with a hot snare. Resected and retrieved.                           - The examination was otherwise normal on direct                            and retroflexion  views. Recommendation:           - Patient has a contact number available for                            emergencies. The signs and symptoms of potential                            delayed complications were discussed with the                            patient. Return to normal activities tomorrow.                            Written discharge instructions were provided to the                            patient.                           -  Resume previous diet.                           - Continue present medications.                           - Await pathology results. Milus Banister, MD 07/27/2020 11:12:51 AM This report has been signed electronically.

## 2020-07-27 NOTE — Patient Instructions (Signed)
Information on polyps given to you today.  Await pathology results.  Resume previous diet and medications.  YOU HAD AN ENDOSCOPIC PROCEDURE TODAY AT THE [facility]:   Refer to the procedure report that was given to you for any specific questions about what was found during the examination.  If the procedure report does not answer your questions, please call your gastroenterologist to clarify.  If you requested that your care partner not be given the details of your procedure findings, then the procedure report has been included in a sealed envelope for you to review at your convenience later.  YOU SHOULD EXPECT: Some feelings of bloating in the abdomen. Passage of more gas than usual.  Walking can help get rid of the air that was put into your GI tract during the procedure and reduce the bloating. If you had a lower endoscopy (such as a colonoscopy or flexible sigmoidoscopy) you may notice spotting of blood in your stool or on the toilet paper. If you underwent a bowel prep for your procedure, you may not have a normal bowel movement for a few days.  Please Note:  You might notice some irritation and congestion in your nose or some drainage.  This is from the oxygen used during your procedure.  There is no need for concern and it should clear up in a day or so.  SYMPTOMS TO REPORT IMMEDIATELY:   Following lower endoscopy (colonoscopy or flexible sigmoidoscopy):  Excessive amounts of blood in the stool  Significant tenderness or worsening of abdominal pains  Swelling of the abdomen that is new, acute  Fever of 100F or higher   For urgent or emergent issues, a gastroenterologist can be reached at any hour by calling [phone]. Do not use MyChart messaging for urgent concerns.    DIET:  We do recommend a small meal at first, but then you may proceed to your regular diet.  Drink plenty of fluids but you should avoid alcoholic beverages for 24 hours.  ACTIVITY:  You should  plan to take it easy for the rest of today and you should NOT DRIVE or use heavy machinery until tomorrow (because of the sedation medicines used during the test).    FOLLOW UP: Our staff will call the number listed on your records 48-72 hours following your procedure to check on you and address any questions or concerns that you may have regarding the information given to you following your procedure. If we do not reach you, we will leave a message.  We will attempt to reach you two times.  During this call, we will ask if you have developed any symptoms of COVID 19. If you develop any symptoms (ie: fever, flu-like symptoms, shortness of breath, cough etc.) before then, please call [phone].  If you test positive for Covid 19 in the 2 weeks post procedure, please call and report this information to us.    If any biopsies were taken you will be contacted by phone or by letter within the next 1-3 weeks.  Please call us at [phone] if you have not heard about the biopsies in 3 weeks.    SIGNATURES/CONFIDENTIALITY: You and/or your care partner have signed paperwork which will be entered into your electronic medical record.  These signatures attest to the fact that that the information above on your After Visit Summary has been reviewed and is understood.  Full responsibility of the confidentiality of this discharge information lies with you and/or your care-partner. 

## 2020-07-30 ENCOUNTER — Telehealth: Payer: Self-pay | Admitting: Gastroenterology

## 2020-07-30 NOTE — Telephone Encounter (Signed)
The pt had a colon on 9/10 and states that today he noticed a small amount of BRB in the toilet tissue.  He says no other symptoms. He may have some irritation from the prep or polyp removal.   He has been advised to watch and if the bleeding becomes heavy or he develops other symptoms to call back.  The pt has been advised of the information and verbalized understanding.   I will also send to Dr Ardis Hughs for any further recommendations.

## 2020-07-31 ENCOUNTER — Telehealth: Payer: Self-pay

## 2020-07-31 NOTE — Telephone Encounter (Signed)
  Follow up Call-  Call back number 07/27/2020  Post procedure Call Back phone  # [phone]  Permission to leave phone message Yes  Some recent data might be hidden     Patient questions:  Do you have a fever, pain , or abdominal swelling? No. Pain Score  0 *  Have you tolerated food without any problems? Yes.    Have you been able to return to your normal activities? Yes.    Do you have any questions about your discharge instructions: Diet   No. Medications  No. Follow up visit  No.  Do you have questions or concerns about your Care? No. Patient states he called in on 9/13 because he had BRB in stool and was advised to report back in 2 days if the bleeding was still occurring.  As of this morning it is still present but has not worsened.  Pt states he will call in tomorrow if the bleeding is still present.  Not experiencing any pain or discomfort.  Actions: * If pain score is 4 or above: No action needed, pain <4.  1. Have you developed a fever since your procedure? no  2.   Have you had an respiratory symptoms (SOB or cough) since your procedure? no  3.   Have you tested positive for COVID 19 since your procedure no  4.   Have you had any family members/close contacts diagnosed with the COVID 19 since your procedure?  no   If yes to any of these questions please route to Joylene John, RN and Joella Prince, RN

## 2020-08-01 ENCOUNTER — Encounter: Payer: Self-pay | Admitting: Gastroenterology

## 2020-08-01 ENCOUNTER — Telehealth: Payer: Self-pay | Admitting: Gastroenterology

## 2020-08-01 NOTE — Telephone Encounter (Signed)
Patient called said the bleeding stopped but is now having a lot of pain and feels like he needs to go have a BM but he cant please advise

## 2020-08-01 NOTE — Telephone Encounter (Signed)
The pt states all his bleeding has stopped but he has not had a BM since his colon on 9/10.  I advised that it can take a little while to get back to normal with his bowels.  He was advised that he can try a dose of miralax if he feels like he needs to have a BM.  The pt agreed and will call back with any further concerns.

## 2020-08-07 ENCOUNTER — Telehealth: Payer: Self-pay | Admitting: Critical Care Medicine

## 2020-08-07 NOTE — Telephone Encounter (Signed)
Copied from [place] [id number]. Topic: General - Other >> Aug 07, 2020  1:51 PM Rainey Pines A wrote: Patient would like a callback from [facility] in regards to the psychiatrist she recommended. Patient cannot remember the doctors name. Please advise

## 2020-08-08 ENCOUNTER — Telehealth: Payer: Self-pay | Admitting: Gastroenterology

## 2020-08-08 NOTE — Telephone Encounter (Signed)
[facility] [address] [place], [name]  [address] Phone:  [phone]   Fax:  [phone]  August 01, 2020    Spencer Mendoza [address] [place] [address]    Dear [name],    The polyps that I removed during your recent procedure were completely benign but were proven to be "pre-cancerous" polyps that MAY have grown into cancers if they had not been removed.  Studies shows that at least 20% of women over age 49 and 30% of men over age 34 have pre-cancerous polyps.  Based on current nationally recognized surveillance guidelines, I recommend that you have a repeat colonoscopy in 3 years.   If you develop any new rectal bleeding, abdominal pain or significant bowel habit changes, please contact me before then.   Sincerely,    Milus Banister, MD  [facility]                       [place], [phone]                                1

## 2020-08-09 NOTE — Telephone Encounter (Signed)
Call placed to patient. LCSW left message requesting a return call.  

## 2020-08-10 ENCOUNTER — Telehealth: Payer: Self-pay | Admitting: Critical Care Medicine

## 2020-08-10 NOTE — Telephone Encounter (Signed)
Copied from [place] [id number]. Topic: General - Other >> Aug 10, 2020 10:37 AM Keene Breath wrote: Reason for CRM: Patient called to speak with [facility] regarding an appt he said he has with a psychiatrist at the end of the month.  Please advise and call patient to discuss at [phone]

## 2020-08-13 MED ORDER — ALPRAZOLAM 0.25 MG PO TABS: 0.2500 mg | ORAL_TABLET | Freq: Three times a day (TID) | ORAL | 0 refills | Status: AC | PRN

## 2020-08-13 NOTE — Telephone Encounter (Signed)
Follow up call placed to patient. Patient shared that he has a scheduled appointment with [facility] for therapy and medication management on October 03, 2020. Pt reports that he has 14 pills left from previous Xanax prescription that has decreased seizures and panic attacks. Pt would like to know if Dr. Joya Gaskins will refill prescription to bridge him to his appointment with [facility] in November. Next scheduled appt with Dr. Joya Gaskins is on November 9, 21. Please advise

## 2020-08-23 NOTE — Telephone Encounter (Signed)
Follow up call placed to patient. Patient shared that he was aware of prescription re-fill and voiced appreciation for [facility]'s staff. Pt was reminded of upcoming appointments and LCSW verified pt's emergency contact numbers, per pt request. No additional concerns noted.

## 2020-08-27 ENCOUNTER — Other Ambulatory Visit: Payer: Self-pay

## 2020-08-27 ENCOUNTER — Ambulatory Visit (INDEPENDENT_AMBULATORY_CARE_PROVIDER_SITE_OTHER): Payer: Medicare Other | Admitting: Neurology

## 2020-08-27 ENCOUNTER — Encounter: Payer: Self-pay | Admitting: Neurology

## 2020-08-27 VITALS — BP 120/84 | Ht 72.0 in | Wt 152.8 lb

## 2020-08-27 DIAGNOSIS — G43711 Chronic migraine without aura, intractable, with status migrainosus: Secondary | ICD-10-CM | POA: Diagnosis not present

## 2020-08-27 DIAGNOSIS — R569 Unspecified convulsions: Secondary | ICD-10-CM

## 2020-08-27 DIAGNOSIS — F5104 Psychophysiologic insomnia: Secondary | ICD-10-CM | POA: Diagnosis not present

## 2020-08-27 MED ORDER — LEVETIRACETAM 1000 MG PO TABS: 1000.0000 mg | ORAL_TABLET | Freq: Two times a day (BID) | ORAL | 11 refills | Status: DC

## 2020-08-27 NOTE — Progress Notes (Signed)
I have read the note, and I agree with the clinical assessment and plan.  Ragina Fenter K Darin Redmann   

## 2020-08-27 NOTE — Patient Instructions (Signed)
Continue current medications I'll check to see if any savings programs are available for Botox  Keep appointment with psychiatry  Call for seizures  See you back in 6 months

## 2020-08-27 NOTE — Progress Notes (Signed)
PATIENT: Spencer Mendoza DOB: 12-13-1960  REASON FOR VISIT: follow up HISTORY FROM: patient  HISTORY OF PRESENT ILLNESS: Today 08/27/20 Spencer Mendoza is a 59 year old male with history of episodic seizures associated with generalized tonic-clonic events, bipolar disorder, and migraine headaches.  He is on trazodone 200 mg at bedtime, Keppra 1000 mg twice a day, and received Botox therapy.  At last visit, Keppra was increased due to reported seizure 02/15/2020.  No seizure since.  Has had weight loss, has stabilized around 152 lbs, was 155 lbs 6 month ago.  Had colonoscopy, a few precancer polyps removed.  No etiology for weight loss found.  Recently having significant panic attacks, PCP put him on a short course of Xanax PRN, seen new [facility] psychiatry in November.  Not working, on disability.  More headache, could not afford Botox co-pay, last Botox in March 2021, Botox was really beneficial for him.  Continues to smoke cigarettes, we talked about history of epilepsy versus pseudoseizures, indicates epilepsy since he was born, his mom had epilepsy.  No EtOH in over 1 year.  Here today accompanied by his significant other.  HISTORY 02/23/2020 SS: Spencer Mendoza is a 59 year old male with history of episodic seizures associated with generalized tonic-clonic events, bipolar disorder, and migraine headache.  He is taking trazodone 200 mg at bedtime, Keppra 750 mg twice a day.  He received Botox therapy in November and March for migraines.  Botox has been very helpful for his headaches, did have 1 severe headache following his Botox injection in March, keeps Decadron taper on hand, took this with good benefit once in March.  Overall, headaches doing well.  He may have had a seizure 02/15/2020, he thinks that he did, is unclear of details, worked that day, was stressed out, came home, had preseizure feeling, laid down, unclear what happened next or does not want to elaborate.  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 Yesterday complained of chest pain, pain in his left arm, shortness of breath, vomiting.  Denies any EtOH, drug use.  Is smoking half pack a day.  In less than 2 months, has lost about 15 pounds unintentionally.  He presents today accompanied by his fiance Spencer Mendoza.  REVIEW OF SYSTEMS: Out of a complete 14 system review of symptoms, the patient complains only of the following symptoms, and all other reviewed systems are negative.  Headache, seizure  ALLERGIES: Allergies  Allergen Reactions  . Sertraline Other (See Comments)    Erectile dysfunction  . Codeine Nausea And Vomiting  . Depakote [Divalproex Sodium] Other (See Comments)    Made patient shake  . Migranal [Dihydroergotamine] Nausea And Vomiting  . Topamax [Topiramate] Other (See Comments)    Made patient angry    HOME MEDICATIONS: Outpatient Medications Prior to Visit  Medication Sig Dispense Refill  . albuterol (VENTOLIN HFA) 108 (90 Base) MCG/ACT inhaler Inhale 2 puffs into the lungs every 4 (four) hours as needed for shortness of breath or wheezing. 18 g 1  . ALPRAZolam (XANAX) 0.25 MG tablet Take 1 tablet (0.25 mg total) by mouth 3 (three) times daily as needed for anxiety. 60 tablet 0  . aspirin EC 81 MG tablet Take 1 tablet (81 mg total) by mouth daily. Swallow whole. 30 tablet 11  . budesonide-formoterol (SYMBICORT) 160-4.5 MCG/ACT inhaler Inhale 2 puffs into the lungs 2 (two) times daily. 1 Inhaler  12  . dexamethasone (DECADRON) 2 MG tablet Keep on hand if needed for acute headache, take 3, then take 2, then take 1 6 tablet 0  . folic acid (FOLVITE) 1 MG tablet Take 1 tablet (1 mg total) by mouth daily. 30 tablet 3  . ondansetron (ZOFRAN ODT) 4 MG disintegrating tablet Take 1 tablet (4 mg total) by mouth every 8 (eight) hours as needed for nausea or vomiting. 12 tablet 0  . rosuvastatin (CRESTOR) 10 MG  tablet Take 1 tablet (10 mg total) by mouth daily. 90 tablet 3  . thiamine 100 MG tablet Take 1 tablet (100 mg total) by mouth daily. 30 tablet 3  . traZODone (DESYREL) 100 MG tablet Take 2 tablets (200 mg total) by mouth at bedtime. 60 tablet 11  . levETIRAcetam (KEPPRA) 1000 MG tablet Take 1 tablet (1,000 mg total) by mouth 2 (two) times daily. 60 tablet 11  . hydrOXYzine (ATARAX/VISTARIL) 25 MG tablet Take 1 tablet (25 mg total) by mouth 3 (three) times daily as needed for anxiety. (Patient not taking: Reported on 07/20/2020) 30 tablet 0  . nicotine polacrilex (NICORETTE MINI) 4 MG lozenge Use one 4 times daily to stop smoking (Patient not taking: Reported on 07/20/2020) 100 tablet 4   No facility-administered medications prior to visit.    PAST MEDICAL HISTORY: Past Medical History:  Diagnosis Date  . Alcohol withdrawal ([place]) 07/29/2013  . Allergy   . Anxiety   . Benzodiazepine abuse ([place]) 08/30/2019  . Bipolar disorder ([place])   . Chronic insomnia 06/06/2015  . Chronic migraine without aura, with intractable migraine, so stated, with status migrainosus 08/25/2019  . Claustrophobia   . Cocaine abuse ([place]) 05/22/2015  . Delirium tremens ([place]) 07/29/2013  . Depression   . Epilepsy ([place])    last seizure week of 06-25-2020- sev. grand mal seizures per pt   . Hemiplegic migraine with status migrainosus 06/06/2015  . Hypertension   . Migraine   . Opioid use disorder 08/30/2019  . PONV (postoperative nausea and vomiting)   . Stroke [facility])    patient denies- states had a hemiplegic migraine in ~2011  . Substance abuse ([place])     PAST SURGICAL HISTORY: Past Surgical History:  Procedure Laterality Date  . ANKLE SURGERY     in high school  . APPENDECTOMY    . COLONOSCOPY    . KNEE ARTHROSCOPY Right   . NOSE SURGERY      FAMILY HISTORY: Family History  Problem Relation Age of Onset  . Heart disease Mother   . Stroke Mother   . Epilepsy Mother   . Heart disease Father   . Hypertension  Father   . Melanoma Father   . Migraines Father   . Colon cancer Sister 62  . Colon cancer Cousin   . Colon polyps Neg Hx   . Esophageal cancer Neg Hx   . Rectal cancer Neg Hx   . Stomach cancer Neg Hx     SOCIAL HISTORY: Social History   Socioeconomic History  . Marital status: Legally Separated    Spouse name: Not on file  . Number of children: 0  . Years of education: GED  . Highest education level: Not on file  Occupational History  . Occupation: disabled  Tobacco Use  . Smoking status: Current Every Day Smoker    Packs/day: 0.50    Years: 15.00    Pack years: 7.50    Types: Cigarettes  . Smokeless tobacco: Never Used  Vaping Use  . Vaping Use: Never used  Substance and Sexual Activity  . Alcohol use: Not Currently    Alcohol/week: 6.0 standard drinks    Types: 6 Cans of beer per week    Comment: none since Sept 2020  . Drug use: Yes    Types: Marijuana    Comment: hx of crack, heroin; only THC since Sept 2020  . Sexual activity: Yes    Birth control/protection: Condom  Other Topics Concern  . Not on file  Social History Narrative   Pt lives with sister.   Patient drinks 2 cups of caffeine daily.   Patient is right handed.   Social Determinants of Health   Financial Resource Strain:   . Difficulty of Paying Living Expenses: Not on file  Food Insecurity:   . Worried About Charity fundraiser in the Last Year: Not on file  . Ran Out of Food in the Last Year: Not on file  Transportation Needs:   . Lack of Transportation (Medical): Not on file  . Lack of Transportation (Non-Medical): Not on file  Physical Activity:   . Days of Exercise per Week: Not on file  . Minutes of Exercise per Session: Not on file  Stress:   . Feeling of Stress : Not on file  Social Connections:   . Frequency of Communication with Friends and Family: Not on file  . Frequency of Social Gatherings with Friends and Family: Not on file  . Attends Religious Services: Not on file  .  Active Member of Clubs or Organizations: Not on file  . Attends Archivist Meetings: Not on file  . Marital Status: Not on file  Intimate Partner Violence:   . Fear of Current or Ex-Partner: Not on file  . Emotionally Abused: Not on file  . Physically Abused: Not on file  . Sexually Abused: Not on file   PHYSICAL EXAM  Vitals:   08/27/20 0816  BP: 120/84  Weight: 152 lb 12.8 oz (69.3 kg)  Height: 6' (1.829 m)   Body mass index is 20.72 kg/m.  Generalized: Well developed, in no acute distress  Neurological examination  Mentation: Alert oriented to time, place, history taking. Follows all commands speech and language fluent Cranial nerve II-XII: Pupils were equal round reactive to light. Extraocular movements were full, visual field were full on confrontational test. Facial sensation and strength were normal. Head turning and shoulder shrug  were normal and symmetric. Motor: The motor testing reveals 5 over 5 strength of all 4 extremities. Good symmetric motor tone is noted throughout.  Sensory: Sensory testing is intact to soft touch on all 4 extremities. No evidence of extinction is noted.  Coordination: Cerebellar testing reveals good finger-nose-finger and heel-to-shin bilaterally.  Tremulous with finger-nose-finger, anxious, claustrophobic. Gait and station: Gait is normal.  DIAGNOSTIC DATA (LABS, IMAGING, TESTING) - I reviewed patient records, labs, notes, testing and imaging myself where available.  Lab Results  Component Value Date   WBC 10.8 07/13/2020   HGB 16.3 07/13/2020   HCT 47.3 07/13/2020   MCV 94 07/13/2020   PLT 312 07/13/2020      Component Value Date/Time   NA 139 07/13/2020 0921   K 4.4 07/13/2020 0921   CL 97 07/13/2020 0921   CO2 25 07/13/2020 0921   GLUCOSE 88 07/13/2020 0921   GLUCOSE 64 (L) 02/28/2020 0959   BUN 11 07/13/2020 0921   CREATININE 1.02 07/13/2020 0921   CREATININE 0.99 03/13/2015 1004  CALCIUM 10.4 (H) 07/13/2020 0921    PROT 7.0 07/13/2020 0921   ALBUMIN 5.0 (H) 07/13/2020 0921   AST 22 07/13/2020 0921   ALT 24 07/13/2020 0921   ALKPHOS 79 07/13/2020 0921   BILITOT 0.5 07/13/2020 0921   GFRNONAA 79 07/13/2020 0921   GFRNONAA 85 03/13/2015 1004   GFRAA 91 07/13/2020 0921   GFRAA >89 03/13/2015 1004   Lab Results  Component Value Date   CHOL 163 07/13/2020   HDL 101 07/13/2020   LDLCALC 50 07/13/2020   TRIG 58 07/13/2020   CHOLHDL 1.6 07/13/2020   Lab Results  Component Value Date   HGBA1C 4.9 02/01/2018   No results found for: [id number] Lab Results  Component Value Date   TSH 3.310 03/07/2020   ASSESSMENT AND PLAN 59 y.o. year old male  has a past medical history of Alcohol withdrawal ([place]) (07/29/2013), Allergy, Anxiety, Benzodiazepine abuse ([place]) (08/30/2019), Bipolar disorder ([place]), Chronic insomnia (06/06/2015), Chronic migraine without aura, with intractable migraine, so stated, with status migrainosus (08/25/2019), Claustrophobia, Cocaine abuse ([place]) (05/22/2015), Delirium tremens ([place]) (07/29/2013), Depression, Epilepsy ([place]), Hemiplegic migraine with status migrainosus (06/06/2015), Hypertension, Migraine, Opioid use disorder (08/30/2019), PONV (postoperative nausea and vomiting), Stroke ([place]), and Substance abuse ([place]). here with:  1.  History of seizures -Last seizure 02/15/2020, on Keppra 750 mg BID -Continue Keppra 1000 mg twice a day, no recurrent seizure  2.  Alcohol abuse, marijuana abuse -Denies as of recent  3.  Chronic insomnia -Doing well with trazodone, continue 200 mg at bedtime  4.  Chronic intractable migraine headache -Excellent benefit with Botox, last in March 2021 -Reportedly cannot afford co-pay, will see if any savings program available  5.  Anxiety, bipolar disorder -On Xanax as needed, seeing a psychiatrist at [facility] next month  6.  Unintentional weight loss -Stabilized, at 152 lbs, working with PCP, GI found precancerous polyps   Follow-up in 6 months or  sooner if needed, call for recurrent seizure.  I spent 30 minutes of face-to-face and non-face-to-face time with patient.  This included previsit chart review, lab review, study review, order entry, electronic health record documentation, patient education.  Butler Denmark, AGNP-C, DNP 08/27/2020, 8:44 AM [facility] [address], [place], [place] [address] [phone]

## 2020-08-29 ENCOUNTER — Telehealth: Payer: Self-pay | Admitting: Neurology

## 2020-08-29 NOTE — Telephone Encounter (Signed)
Please let him know, no other options for financial assistance for botox unless he can pay his copay unfortunately.

## 2020-08-30 NOTE — Telephone Encounter (Signed)
Spoke with patient. He is aware of Sarah's response and verbalized understanding.   Nothing further needed at time of call.

## 2020-09-24 NOTE — Progress Notes (Signed)
Subjective:    Patient ID: Spencer Mendoza, male    DOB: 04/28/1961, 59 y.o.   MRN: [id number]  History of Present Illness: First OV 09/16/19 This is a 59 year old white male history of episodic seizures associated with tonic-clonic events and urinary incontinence.  History also of heavy ethanol use and severe generalized anxiety disorder with claustrophobia.  Patient has significant insomnia as well.  The patient has also chronic history of migraines been on multiple medications for this in the past as well.  Patient has a history of substance use including opioids in the past as well.  Patient also uses marijuana at this time.  Patient has history of bipolar disorder and severe depression.  Patient presents today post emergency room visit to establish for primary care.  The patient had been in this clinic previously 5 years ago and wishes to come back to the clinic now to establish.  There is also prior history of stroke.  Also history of nausea vomiting and diarrhea in the past.  The patient was seen in the emergency room on 24 September for similar complaints.  Below is the office visit with neurology on October 8  08/25/19 OV with Neurology Spencer Mendoza is a 59 year old right-handed white male with a history of episodic seizures associated with generalized tonic-clonic events and occasional urinary incontinence.  The patient has had significant issues with alcohol abuse, he may drink up to 12 beers a day.  He has been in the hospital in June, and on 17 September with seizures, he was in the hospital on 11 August 2019 with gastroenteritis, a CT scan of the abdomen at that time showed a low-density area in the liver that was new, he will be evaluated for possible liver cancer in the near future.  The patient has a history of bipolar disorder, he is not followed through psychiatry.  He reports a lot of problems with claustrophobia and GAD.  He is not sleeping well, he indicates that severe  insomnia may worsen his seizure control.  He is on trazodone 100 mg at night but this is not helpful.  The patient continues to have headaches almost daily or every other day, he may have a severe headache at least once a week.  He has been on a multitude of medications in the past for migraine including propranolol, amitriptyline, Topamax, Depakote, Effexor, gabapentin, Seroquel, Prozac, and Aimovig.  None of these modalities have been effective in controlling his headache.  The patient returns for an evaluation.   1.  History of seizures  2.  Alcohol abuse, marijuana abuse  3.  Chronic insomnia  4.  Chronic intractable migraine  5.  Anxiety, bipolar disorder  The patient will be increased on the trazodone to 150 mg at night, he will call for any dose adjustments.  He will be set up for Botox therapy.  The patient will go up on the Keppra dosing to 750 mg twice daily.  He will follow-up here in 6 months, likely be seen sooner for Botox therapy.  Below is the emergency room visit September 24 In ED 9/24: Spencer Mendoza is a 59 y.o. male with history of bipolar, epilepsy, hypertension, migraine, CVA, substance abuse, EtOH abuse presents today for abdominal pain nausea vomiting diarrhea and rash.  Patient reports that on Sunday evening he ate shrimp that he found in his refrigerator.  He reports that he was unaware of it at that time but shrimp had been in the refrigerator for  greater than 12 days.  He reports that on Sunday night he developed abdominal pain and diarrhea.  He describes a generalized abdominal pain a severe cramping sensation constant without aggravating or alleviating symptoms or radiation.  Abdominal pain has been constant for the past 4 days.  Patient reports that on Monday morning he began taking a new medication prescribed by his neurologist, Lamictal for seizures in addition to his normal Keppra.  He reports that shortly after taking the Lamictal on Monday morning  his diarrhea improved but was followed by nausea and vomiting.  He describes multiple episodes of nonbloody/nonbilious emesis over the past 3 days with continued abdominal pain.  Patient reports that on Tuesday the day after he began taking Lamictal he developed a diffuse red rash to his torso that has remained constant since that time.  Reports feeling warm and cold but denies measured fever at home.  Denies headache/vision changes, neck pain, difficulty swallowing, feeling of throat closing, burning of the mouth, chest pain/shortness of breath, dysuria/hematuria, fall/injury or any additional concerns.   On initial evaluation patient is overall well-appearing and in no acute distress, not actively vomiting.  He has mild abdominal tenderness generalized without peritoneal signs.  He has a diffuse mild erythematous rash of the anterior torso.  He has no airway or mucosal involvement.  Discussed case with Dr. Roslynn Amble, discussed possible etiologies such as squamoid poisoning, feel this is unlikely based on history and physical examination but will give Benadryl today.  He did begin taking Lamictal just prior to onset of his rash, review of notes from [facility] neurology show that they recommended that he stop taking Lamictal today but continue with his Keppra. - Patient seen and evaluated by Dr. Roslynn Amble advises that if no acute findings on CT abdomen pelvis will discharge with Benadryl and outpatient follow-up. - CBC with extensions of 11.4 Lipase within normal limits CMP with anion gap 16, suspect secondary to dehydration Urinalysis with ketones and elevated specific gravity, suspect secondary to dehydration Ethanol negative, patient does not appear to be in alcohol withdrawal  CT abdomen pelvis:  IMPRESSION: No acute CT finding to account for abdominal pain.  There is a small hypodense/hypoenhancing lesion within segment 7 of the liver which was not present on the comparison noncontrast  CT. The enhancement characteristics are suspicious, and while this could represent a vascular lesion, malignancy (either primary or metastatic) cannot be excluded. Nonemergent follow-up contrast-enhanced liver protocol MRI is recommended. These results were discussed by telephone at the time of interpretation on 08/11/2019 at 5:21 pm with Dr. Nuala Alpha.  Aortic Atherosclerosis (ICD10-I70.0). - Discussed case with in-house pharmacy who advises that patient take Benadryl 25 mg twice daily x7 days for his rash and discontinue lamotrigine, follow-up for recheck early next week. - Patient reevaluated resting comfortably and in no acute distress.  On reevaluation abdomen is soft without peritoneal signs.  Patient has been updated on results today and he states understanding of need for follow-up.  He will be given referral to gastroenterologist for further evaluation encouraged to call office tomorrow morning.  He will be discharged with Zofran for nausea and vomiting.  Patient advised to stop taking lamotrigine immediately and to call his neurologist tomorrow morning to advise them of this.  He has Keppra at home that he will continue taking for his symptoms.  Patient does not appear to be having Stevens-Johnson syndrome at this time, he is overall well-appearing and in no acute distress.  He is advised to  take Benadryl 25 mg twice daily x7 days and to follow-up with neurologist and PCP on Monday.  Note the patient does need a follow-up MRI of the liver because of the liver lesion seen which could represent a malignancy or metastatic lesion.  Contrast-enhanced liver protocol MRI was recommended.  The patient now has been sober for 3 weeks.  Is not drinking alcohol currently.  This patient had been previously in mental health programs for drug substance use and depression.  He has been on multiple depression medications in the past.  He has been on trazodone for sleep in the past.  He also has been  on Prozac.  He also used IV heroin in the past and also cocaine and Dilaudid in the past.  He does obtain off the street Xanax and uses this as needed at home.   10/03/2019 This patient is seen today in return follow-up from the mid October visit as documented above.  The patient currently is not drinking alcohol at this time.  He still suffers from significant depression and anxiety.  We tried sertraline on him 50 mg daily but he experienced erectile dysfunction and had to stop this medication.  He is continue the thiamine 756 mg daily folic acid 1 mg daily.  The patient is currently not on treatment for hypertension as his blood pressures been well controlled.  Note this patient underwent an MRI of the liver to look for any abnormalities in the liver when an abdominal CT scan suggested potential liver cancer.  The MRI in fact showed a benign hepatic hemangioma and nothing else.  I went over with the patient this result previously on the phone today we went over this visit the results as well face-to-face.  The patient states he still has significant anxiety and depression and has not been able to achieve a psychiatry appointment as of yet.  He did meet with the licensed Education officer, [employer]. The patient is still smoking a pack a day of cigarettes.  He has not attempted to use nicotine to replace cigarettes as of yet.  He is maintaining his inhalers for COPD. Patient does complain of significant ongoing insomnia though he is on the trazodone 200 mg a day he gets him to sleep however he awakens in the middle the night with some choking sensations. The patient has completely stopped drinking alcohol  11/09/2019 This is a follow-up telephone visit for this patient who was last seen in November.  The patient has recently received Botox injections for his migraine syndrome and this is markedly improved his symptoms.  He maintains his seizure medications as well per neurology.  He is still smoking about half a pack a  day of cigarettes.  He complains of increased cough increased sinus pressure increased sneezing low-grade fever dizziness.  He was Covid + November 22 and at that time had more gastrointestinal symptoms.  He had a girlfriend was positive as well. He has yet to achieve a psychiatry visit for his major depressive disorder.  His insurance did not approve the referrals we made previously.  Also the patient continues with his insomnia and hypersomnolence during the day and is yet to achieve a sleep study due to barriers from [employer] as well.  He notes thick yellow mucus coming out of the nose at this time. The patient is using the Symbicort inhaler as prescribed   11/30/2019 The patient is seen today and is in very good spirits and in much improved mood and  affect.  He states the trazodone has helped him at night during sleep.  He has seen a mental health provider at [facility] at this time and is awaiting further recommendations from [facility] on mental health therapy.  He has a follow-up visit with a therapist coming up soon.  The patient's migraine headaches are significantly improved and stable at this time.  In addition the patient has been abstinent from alcohol for over 9 weeks.  Patient is still smoking small amounts and is interested in further smoking cessation assistance.  He does have good breathing at this time and is on the Symbicort.  There have been no additional seizures and he maintains on Keppra and as well the patient is on folic acid and thiamine while off alcohol   There has not been any additional abdominal pain since the last visit  03/20/2020 Patient seen in return follow-up with history of hypertension and now diagnosed calcium deposits in the coronaries as well as aortic atherosclerosis with 50% occlusion at the distal aorta.  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  Patient also had presyncopal episodes as well.  He continues have chronic insomnia however trazodone has been somewhat of help.  He is yet to have a sleep study as we have requested previously.  Patient is still smoking about a half a pack a day of cigarettes and we discussed the need to reduce smoking previously.  Patient states his mood disorder has stabilized and does have follow-up with his mental health provider. The patient did go to the emergency room when he had increased chest discomfort with negative troponins negative EKG however has since seen cardiology and they are scheduling him for coronary CT scan along with a long-term rhythm monitor.  The patient needs to be on statins but with multiple other issues going on at this time we have held off on this also he needs a Covid vaccine but again elected to hold off on this during his current symptom complex  07/12/2020 This patient was seen today in return follow-up by way of a telephone visit.  This patient's been noting increased cough shortness of breath and lightheadedness with temp up to 101 over the past week.  He went and had a Covid test which was negative.  He has had also some chest pain in the front of his chest.  He is not using alcohol or cocaine he is reduced his marijuana intake because it irritates his breathing.  He is using his inhalers.  He smoking about 1/2 pack a day of cigarettes.  The cough is productive of thick yellow mucus with wheezing associated  From a mental health perspective he has a follow-up appointment within the next month and is wishing to be bridged with Xanax until he gets in with the mental health provider.  He has been on Xanax in the past and this helps his panic attacks he takes 0.5 mg 1-2 times daily and only uses it as needed  The patient has a history of migraines but cannot afford the Botox is not taking this medication at this time  The patient's not had  any recent seizures.  The patient had tried the Wellbutrin but did not help him and he is off this medication at this time hydroxyzine also was no help with his anxiety  He does maintain  the Symbicort inhaler maintains Keppra Rova statin trazodone he is in need of refills on these medications Abdominal aortic atherosclerosis ([place]) with 50% occlusion distal aorta Patient has abdominal aortic atherosclerosis with 50% occlusion of the aorta not a good candidate for statins due to muscular side effects of other medications  Plan will be to refer patient to vascular surgery for further evaluation   11/9 Patient seen return follow-up underlying COPD chronic anxiety depression now has access to health insurance.  His tobacco use is down to less than half a pack a day.  The patient states that his cough is slightly increased productive of thick yellow mucus.  He has increased sinus congestion.  He is on the Xanax and he is stretching this out it does help to some degree.  He has an appointment at [facility] soon.  He has been free of alcohol now for 14 months.  He is trying to cut back on the tobacco use.  He will get a flu vaccine but wants to learn more about the Covid vaccine first as he has been reluctant to obtain the Covid vaccine    Past Medical History:  Diagnosis Date  . Alcohol withdrawal ([place]) 07/29/2013  . Allergy   . Anxiety   . Benzodiazepine abuse ([place]) 08/30/2019  . Bipolar disorder ([place])   . Chronic insomnia 06/06/2015  . Chronic migraine without aura, with intractable migraine, so stated, with status migrainosus 08/25/2019  . Claustrophobia   . Cocaine abuse ([place]) 05/22/2015  . Delirium tremens ([place]) 07/29/2013  . Depression   . Epilepsy ([place])    last seizure week of 06-25-2020- sev. grand mal seizures per pt   . Hemiplegic migraine with status migrainosus 06/06/2015  . Hypertension   . Migraine   . Opioid use disorder 08/30/2019  . PONV (postoperative nausea and vomiting)   . Stroke  [facility])    patient denies- states had a hemiplegic migraine in ~2011  . Substance abuse ([place])      Family History  Problem Relation Age of Onset  . Heart disease Mother   . Stroke Mother   . Epilepsy Mother   . Heart disease Father   . Hypertension Father   . Melanoma Father   . Migraines Father   . Colon cancer Sister 22  . Colon cancer Cousin   . Colon polyps Neg Hx   . Esophageal cancer Neg Hx   . Rectal cancer Neg Hx   . Stomach cancer Neg Hx      Social History   Socioeconomic History  . Marital status: Legally Separated    Spouse name: Not on file  . Number of children: 0  . Years of education: GED  . Highest education level: Not on file  Occupational History  . Occupation: disabled  Tobacco Use  . Smoking status: Current Every Day Smoker    Packs/day: 0.50    Years: 15.00    Pack years: 7.50    Types: Cigarettes  . Smokeless tobacco: Never Used  Vaping Use  . Vaping Use: Never used  Substance and Sexual Activity  . Alcohol use: Not Currently    Alcohol/week: 6.0 standard drinks    Types: 6 Cans of beer per week    Comment: none since Sept 2020  . Drug use: Yes    Types: Marijuana    Comment: hx of crack, heroin; only THC since Sept 2020  . Sexual activity: Yes    Birth control/protection: Condom  Other Topics Concern  .  Not on file  Social History Narrative   Pt lives with sister.   Patient drinks 2 cups of caffeine daily.   Patient is right handed.   Social Determinants of Health   Financial Resource Strain:   . Difficulty of Paying Living Expenses: Not on file  Food Insecurity:   . Worried About Charity fundraiser in the Last Year: Not on file  . Ran Out of Food in the Last Year: Not on file  Transportation Needs:   . Lack of Transportation (Medical): Not on file  . Lack of Transportation (Non-Medical): Not on file  Physical Activity:   . Days of Exercise per Week: Not on file  . Minutes of Exercise per Session: Not on file  Stress:   .  Feeling of Stress : Not on file  Social Connections:   . Frequency of Communication with Friends and Family: Not on file  . Frequency of Social Gatherings with Friends and Family: Not on file  . Attends Religious Services: Not on file  . Active Member of Clubs or Organizations: Not on file  . Attends Archivist Meetings: Not on file  . Marital Status: Not on file  Intimate Partner Violence:   . Fear of Current or Ex-Partner: Not on file  . Emotionally Abused: Not on file  . Physically Abused: Not on file  . Sexually Abused: Not on file     Allergies  Allergen Reactions  . Sertraline Other (See Comments)    Erectile dysfunction  . Codeine Nausea And Vomiting  . Depakote [Divalproex Sodium] Other (See Comments)    Made patient shake  . Migranal [Dihydroergotamine] Nausea And Vomiting  . Topamax [Topiramate] Other (See Comments)    Made patient angry     Outpatient Medications Prior to Visit  Medication Sig Dispense Refill  . albuterol (VENTOLIN HFA) 108 (90 Base) MCG/ACT inhaler Inhale 2 puffs into the lungs every 4 (four) hours as needed for shortness of breath or wheezing. 18 g 1  . aspirin EC 81 MG tablet Take 1 tablet (81 mg total) by mouth daily. Swallow whole. 30 tablet 11  . dexamethasone (DECADRON) 2 MG tablet Keep on hand if needed for acute headache, take 3, then take 2, then take 1 6 tablet 0  . folic acid (FOLVITE) 1 MG tablet Take 1 tablet (1 mg total) by mouth daily. 30 tablet 3  . levETIRAcetam (KEPPRA) 1000 MG tablet Take 1 tablet (1,000 mg total) by mouth 2 (two) times daily. 60 tablet 11  . ondansetron (ZOFRAN ODT) 4 MG disintegrating tablet Take 1 tablet (4 mg total) by mouth every 8 (eight) hours as needed for nausea or vomiting. 12 tablet 0  . thiamine 100 MG tablet Take 1 tablet (100 mg total) by mouth daily. 30 tablet 3  . traZODone (DESYREL) 100 MG tablet Take 2 tablets (200 mg total) by mouth at bedtime. 60 tablet 11  . ALPRAZolam (XANAX) 0.25 MG  tablet Take 0.25 mg by mouth at bedtime as needed for anxiety.    . budesonide-formoterol (SYMBICORT) 160-4.5 MCG/ACT inhaler Inhale 2 puffs into the lungs 2 (two) times daily. 1 Inhaler 12  . rosuvastatin (CRESTOR) 10 MG tablet Take 1 tablet (10 mg total) by mouth daily. 90 tablet 3   No facility-administered medications prior to visit.     Review of Systems Constitutional:    weight loss, night sweats,  Fevers, chills, fatigue, lassitude. HEENT:   No headaches,  Difficulty swallowing,  Tooth/dental problems,  Sore throat,               o sneezing, itching, ear ache, nasal congestion, post nasal drip,   CV:   chest pain,  Orthopnea, PND, swelling in lower extremities, anasarca, dizziness, palpitations  GI   heartburn, indigestion, abdominal pain, nausea, vomiting, diarrhea, change in bowel habits, loss of appetite  Resp:  shortness of breath with exertion or at rest.  No excess mucus, nproductive cough,  No non-productive cough,  No coughing up of blood.  No change in color of mucus.  No wheezing.  No chest wall deformity  Skin: no rash or lesions.  GU: no dysuria, change in color of urine, no urgency or frequency.  No flank pain.  MS:  No joint pain or swelling.  No decreased range of motion.  No back pain. Claudication  Psych:   change in mood or affect.  depression or anxiety.  No memory loss.     Objective:   Physical Exam   Vitals:   09/25/20 0923  BP: 137/89  Pulse: 77  SpO2: 98%  Weight: 153 lb 3.2 oz (69.5 kg)  Height: 6' (1.829 m)   No exam this is a telephone visit   08/11/19 CT abdomen pelvis:   IMPRESSION: No acute CT finding to account for abdominal pain.  Hepatobiliary: Rounded hypodense lesion in segment 7, image 12 series 3. Lesion measures 16 mm in greatest diameter, and was not present or visualized on the prior noncontrast CT. There is differential enhancement pattern with less distinct border on the delayed CT images.  There is a small  hypodense/hypoenhancing lesion within segment 7 of the liver which was not present on the comparison noncontrast CT. The enhancement characteristics are suspicious, and while this could represent a vascular lesion, malignancy (either primary or metastatic) cannot be excluded. Nonemergent follow-up contrast-enhanced liver protocol MRI is recommended. These results were discussed by telephone at the time of interpretation on  MRI 09/20/19:  IMPRESSION: 1. 16 mm segment 7 liver lesion has MR imaging characteristics of a benign hepatic hemangioma. 2. No other liver lesions are identified. No intra or extrahepatic biliary dilatation. 3. No acute abdominal findings and no adenopathy.  Recent CT Angio chest noted    Assessment & Plan:  I personally reviewed all images and lab data in the  [facility] system as well as any outside material available during this office visit and agree with the  radiology impressions.   COPD with chronic bronchitis (HCC) COPD with chronic obstructive airways disease we will refill inhaled medications at this time and begin Flonase 2 sprays each nostril daily  MDD (major depressive disorder), recurrent severe, without psychosis ([place]) Severe depression with associated chronic insomnia and anxiety  I have refilled the patient's Xanax and have made a referral to psychiatry  Hypertension Hypertension under good control without use of medications  Abdominal aortic atherosclerosis ([place]) with 50% occlusion distal aorta Continue statin therapy  Tobacco use Smoking continue about a half a pack a day will prescribe nicotine replacement therapy for the patient     . Current smoking consumption amount: 1/2 pack a day  . Dicsussion on advise to quit smoking and smoking impacts: Effects on neurologic cardiovascular lung health  . Patient's willingness to quit: Willing to try to quit  . Methods to quit smoking discussed: Nicotine replacement therapy  . Medication  management of smoking session drugs discussed: Nicotine replacement therapy  .   [name] Setting quit date no  quit date set  . Follow-up arranged 1 month follow-up   Time spent counseling the patient: 5 minutes    Arik was seen today for anxiety.  Diagnoses and all orders for this visit:  COPD with chronic bronchitis ([place])  Chronic migraine without aura, with intractable migraine, so stated, with status migrainosus  Coronary artery disease of native artery of native heart with stable angina pectoris (HCC)  Tobacco use  MDD (major depressive disorder), recurrent severe, without psychosis ([place])  Primary hypertension  Abdominal aortic atherosclerosis ([place]) with 50% occlusion distal aorta  Other orders -     ALPRAZolam (XANAX) 0.25 MG tablet; Take 1 tablet (0.25 mg total) by mouth at bedtime as needed for anxiety. -     rosuvastatin (CRESTOR) 10 MG tablet; Take 1 tablet (10 mg total) by mouth daily. -     budesonide-formoterol (SYMBICORT) 160-4.5 MCG/ACT inhaler; Inhale 2 puffs into the lungs 2 (two) times daily. -     fluticasone (FLONASE) 50 MCG/ACT nasal spray; Place 2 sprays into both nostrils daily.

## 2020-09-25 ENCOUNTER — Encounter: Payer: Self-pay | Admitting: Critical Care Medicine

## 2020-09-25 ENCOUNTER — Other Ambulatory Visit: Payer: Self-pay

## 2020-09-25 ENCOUNTER — Ambulatory Visit: Payer: Medicare Other | Attending: Critical Care Medicine | Admitting: Critical Care Medicine

## 2020-09-25 VITALS — BP 137/89 | HR 77 | Ht 72.0 in | Wt 153.2 lb

## 2020-09-25 DIAGNOSIS — I10 Essential (primary) hypertension: Secondary | ICD-10-CM

## 2020-09-25 DIAGNOSIS — F332 Major depressive disorder, recurrent severe without psychotic features: Secondary | ICD-10-CM

## 2020-09-25 DIAGNOSIS — I700 Atherosclerosis of aorta: Secondary | ICD-10-CM

## 2020-09-25 DIAGNOSIS — G43711 Chronic migraine without aura, intractable, with status migrainosus: Secondary | ICD-10-CM | POA: Diagnosis not present

## 2020-09-25 DIAGNOSIS — I25118 Atherosclerotic heart disease of native coronary artery with other forms of angina pectoris: Secondary | ICD-10-CM | POA: Diagnosis not present

## 2020-09-25 DIAGNOSIS — J449 Chronic obstructive pulmonary disease, unspecified: Secondary | ICD-10-CM | POA: Diagnosis not present

## 2020-09-25 DIAGNOSIS — F17210 Nicotine dependence, cigarettes, uncomplicated: Secondary | ICD-10-CM | POA: Diagnosis not present

## 2020-09-25 DIAGNOSIS — Z720 Tobacco use: Secondary | ICD-10-CM

## 2020-09-25 MED ORDER — ALPRAZOLAM 0.25 MG PO TABS
0.2500 mg | ORAL_TABLET | Freq: Every evening | ORAL | 0 refills | Status: DC | PRN
Start: 2020-09-25 — End: 2020-10-16

## 2020-09-25 MED ORDER — FLUTICASONE PROPIONATE 50 MCG/ACT NA SUSP: 2.0000 | Freq: Every day | NASAL | 6 refills | Status: DC

## 2020-09-25 MED ORDER — BUDESONIDE-FORMOTEROL FUMARATE 160-4.5 MCG/ACT IN AERO: 2.0000 | INHALATION_SPRAY | Freq: Two times a day (BID) | RESPIRATORY_TRACT | 0 refills | Status: DC

## 2020-09-25 MED ORDER — ROSUVASTATIN CALCIUM 10 MG PO TABS
10.0000 mg | ORAL_TABLET | Freq: Every day | ORAL | 3 refills | Status: DC
Start: 2020-09-25 — End: 2021-03-19

## 2020-09-25 NOTE — Assessment & Plan Note (Signed)
Smoking continue about a half a pack a day will prescribe nicotine replacement therapy for the patient     . Current smoking consumption amount: 1/2 pack a day  . Dicsussion on advise to quit smoking and smoking impacts: Effects on neurologic cardiovascular lung health  . Patient's willingness to quit: Willing to try to quit  . Methods to quit smoking discussed: Nicotine replacement therapy  . Medication management of smoking session drugs discussed: Nicotine replacement therapy  .   [name] Setting quit date no quit date set  . Follow-up arranged 1 month follow-up   Time spent counseling the patient: 5 minutes

## 2020-09-25 NOTE — Assessment & Plan Note (Signed)
Severe depression with associated chronic insomnia and anxiety  I have refilled the patient's Xanax and have made a referral to psychiatry

## 2020-09-25 NOTE — Assessment & Plan Note (Signed)
COPD with chronic obstructive airways disease we will refill inhaled medications at this time and begin Flonase 2 sprays each nostril daily

## 2020-09-25 NOTE — Assessment & Plan Note (Signed)
Continue statin therapy.

## 2020-09-25 NOTE — Patient Instructions (Signed)
Refills on your medications sent to your [facility] pharmacy  Begin Flonase 2 sprays each nostril daily for your sinuses  Continue to reduce tobacco use  Keep your appointment with [facility]  Please obtain the Moderna Covid vaccine you can call the number below or check the website below to identify where he can go for this COVID-19 Vaccine Information can be found at: [email or web address] For questions related to vaccine distribution or appointments, please email [email or web address] or call [phone].    We will give you the flu shot at the next visit which will be in December

## 2020-09-25 NOTE — Assessment & Plan Note (Signed)
Hypertension under good control without use of medications

## 2020-09-25 NOTE — Progress Notes (Signed)
Pt stated that he takes Xanax 0.25 and wants to discuss continuing for panic attacks Declined flu shot but wants to discuss

## 2020-09-28 ENCOUNTER — Ambulatory Visit: Payer: Medicare Other | Attending: Internal Medicine

## 2020-09-28 DIAGNOSIS — Z23 Encounter for immunization: Secondary | ICD-10-CM

## 2020-09-28 NOTE — Progress Notes (Signed)
   Covid-19 Vaccination Clinic  Name:  Spencer Mendoza    MRN: [id number] DOB: 09/05/1961  09/28/2020  Spencer Mendoza was observed post Covid-19 immunization for 15 minutes without incident. He was provided with Vaccine Information Sheet and instruction to access the V-Safe system.   Spencer Mendoza was instructed to call 911 with any severe reactions post vaccine: [name] Difficulty breathing  . Swelling of face and throat  . A fast heartbeat  . A bad rash all over body  . Dizziness and weakness   Immunizations Administered    Name Date Dose VIS Date Route   Moderna COVID-19 Vaccine 09/28/2020  1:22 PM 0.5 mL 09/05/2020 Intramuscular   Manufacturer: Moderna   Lot: [id number]   [place]: [id number]

## 2020-10-16 ENCOUNTER — Telehealth: Payer: Self-pay

## 2020-10-16 ENCOUNTER — Telehealth: Payer: Self-pay | Admitting: Critical Care Medicine

## 2020-10-16 DIAGNOSIS — F332 Major depressive disorder, recurrent severe without psychotic features: Secondary | ICD-10-CM

## 2020-10-16 MED ORDER — ALPRAZOLAM 0.25 MG PO TABS: 0.2500 mg | ORAL_TABLET | Freq: Every evening | ORAL | 0 refills | Status: DC | PRN

## 2020-10-16 NOTE — Telephone Encounter (Signed)
Can you call the patient and ask what was positive in the drug screen that they would not manage his xanax?

## 2020-10-16 NOTE — Telephone Encounter (Signed)
Pt called in to follow up on request. Advised per provider below. Pt confirmed that he has used marajuan but will stop.  Pt stated that he was very honest with the [facility] provider. Pt became very upset stating that he cant go 28 days without his medication waiting for his system to become clean. Pt hung up before I could advise further.

## 2020-10-16 NOTE — Telephone Encounter (Signed)
I spoke to the patient and clarified apparently the provider refused to give him Xanax because he the patient told him he had been using marijuana and the encounter ended at that point he did not fully examine or engage with the patient  I told the patient I would give him 30 count of Xanax and ongoing to try to refer him to another site besides [facility] since he does have insurance

## 2020-10-16 NOTE — Telephone Encounter (Signed)
If he tested positive for marijuana, he needs to stop using the marijuana in the state of [place] as it is illegal before I can give him a xanax rx.   He also needs to ask the [facility] provider what other medication for his anxiety can he be treated ?

## 2020-10-16 NOTE — Addendum Note (Signed)
Addended by: Asencion Noble E on: 10/16/2020 06:04 PM   Modules accepted: Orders

## 2020-10-16 NOTE — Telephone Encounter (Signed)
marijuana

## 2020-10-16 NOTE — Telephone Encounter (Signed)
Pt arrived at clinic and requested refill on Xanax, pt states that he was seen at [facility] today and his urine drug screen was positive and they will not refill his medication.

## 2020-10-20 ENCOUNTER — Other Ambulatory Visit: Payer: Self-pay | Admitting: Critical Care Medicine

## 2020-10-20 NOTE — Telephone Encounter (Signed)
Requested Prescriptions  Pending Prescriptions Disp Refills  . SYMBICORT 160-4.5 MCG/ACT inhaler [Pharmacy Med Name: SYMBICORT 160/4.5MCG (120 ORAL INH)] 10.2 g 0    Sig: INHALE 2 PUFFS INTO THE LUNGS TWICE DAILY     Pulmonology:  Combination Products Passed - 10/20/2020 10:47 AM      Passed - Valid encounter within last 12 months    Recent Outpatient Visits          3 weeks ago COPD with chronic bronchitis ([place])   [place] Elsie Stain, MD   3 months ago Essential hypertension   [place] Elsie Stain, MD   7 months ago Coronary artery disease of native artery of native heart with stable angina pectoris [facility])   [place], Patrick E, MD   10 months ago COPD with chronic bronchitis [facility])   [place] Elsie Stain, MD   11 months ago Chronic insomnia   [place] Elsie Stain, MD

## 2020-10-29 ENCOUNTER — Telehealth (INDEPENDENT_AMBULATORY_CARE_PROVIDER_SITE_OTHER): Payer: Medicare Other | Admitting: Psychiatry

## 2020-10-29 ENCOUNTER — Encounter (HOSPITAL_COMMUNITY): Payer: Self-pay | Admitting: Psychiatry

## 2020-10-29 DIAGNOSIS — F4001 Agoraphobia with panic disorder: Secondary | ICD-10-CM | POA: Diagnosis not present

## 2020-10-29 DIAGNOSIS — F4323 Adjustment disorder with mixed anxiety and depressed mood: Secondary | ICD-10-CM

## 2020-10-29 MED ORDER — BUSPIRONE HCL 7.5 MG PO TABS: 7.5000 mg | ORAL_TABLET | Freq: Every day | ORAL | 0 refills | Status: DC
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
## 2020-11-16 ENCOUNTER — Other Ambulatory Visit: Payer: Self-pay | Admitting: Neurology

## 2020-11-22 ENCOUNTER — Other Ambulatory Visit: Payer: Self-pay | Admitting: Critical Care Medicine

## 2020-11-26 ENCOUNTER — Other Ambulatory Visit (HOSPITAL_COMMUNITY): Payer: Self-pay | Admitting: Psychiatry

## 2020-11-27 ENCOUNTER — Encounter (HOSPITAL_COMMUNITY): Payer: Self-pay | Admitting: Psychiatry

## 2020-11-27 ENCOUNTER — Telehealth (INDEPENDENT_AMBULATORY_CARE_PROVIDER_SITE_OTHER): Payer: Medicare Other | Admitting: Psychiatry

## 2020-11-27 ENCOUNTER — Telehealth (HOSPITAL_COMMUNITY): Payer: Self-pay

## 2020-11-27 DIAGNOSIS — F4323 Adjustment disorder with mixed anxiety and depressed mood: Secondary | ICD-10-CM

## 2020-11-27 DIAGNOSIS — F4001 Agoraphobia with panic disorder: Secondary | ICD-10-CM | POA: Diagnosis not present

## 2020-11-27 NOTE — Telephone Encounter (Signed)
Medication management - Telephone call with patient to follow up on his request to speak to the nurse manager for [place] office. Patient spent a great bit of time explaining his medical history with seizures from Epilepsy, anxiety and panic attacks since he was a young man.  Patient stated several times he would like to get off of "Xanax" and "Benzos" but was not sure his anxiety was being fully addressed and managed as he would like.  Patient stated he did feel Buspirone 7.5 mg, one a day was helping but felt this needed to be adjusted up and worked with to adequately address his reported panic attacks and anxiety when they occur.  Patient stated he did not think he should return to for more medication management if provider not willing to work with him but did want to keep individual therapy sessions he has scheduled with Glori Bickers.  Agreed to share the information with Dr. De Nurse with request for an increase in Buspar but to also share patient may want to not return as he is not sure he feels medication adjustments are meeting his needs.  Patient stated he may follow up with Dr. Joya Gaskins who was previously managing his behavioral health service areas and will let our office know if he wants to reschedule a next visit.
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
## 2020-11-27 NOTE — Telephone Encounter (Signed)
First visit he did not talk about xanax as PCP was prescribing it. Todays was focused on xanax , as per notes clearly indicates PCP prescribing it and he has to have a clean UDS.  Considering his seizure history and migraines, its best that neurology or PcP continues to follow in that aspect for medications We can increase buspar to bid , if he needs that dose. You can change to 60 per month I am ok with him following back with other provider as he he request

## 2020-11-28 ENCOUNTER — Telehealth (INDEPENDENT_AMBULATORY_CARE_PROVIDER_SITE_OTHER): Payer: Self-pay

## 2020-11-28 ENCOUNTER — Telehealth: Payer: Self-pay | Admitting: Neurology

## 2020-11-28 NOTE — Telephone Encounter (Signed)
Called pt and no answer.  

## 2020-11-28 NOTE — Telephone Encounter (Signed)
Copied from [place] [id number]. Topic: General - Call Back - No Documentation >> Nov 27, 2020 12:42 PM Erick Blinks wrote: Reason for CRM: [phone]   Pt is very irate, and was extremely profane, vulgar, and rude.     He is requesting to speak to Dr. Joya Gaskins regarding his medication refill request for buspar/xanax. He is upset that his psychiatrist will not increase his dosage of these Rx. And is also upset that he was not scheduled for a flu shot + 2nd moderna shot in December. States that he will smoke marijuana if the office does not help him get the Rx he needs.

## 2020-11-28 NOTE — Telephone Encounter (Signed)
Called pt and he had taken the decadron for hi migraine and has not helped.  After speaking to him, he actually had ben to see his pcp for this and will f/u for now and if needs [place] later will call us back.  He apologized for bothering [place], but I relayed that was not a problem.  Dr Jannifer Franklin out this week,  SS/NP in office.

## 2020-11-28 NOTE — Telephone Encounter (Signed)
Pt called, have had a migraine for 4 days. I have finished the 3-day regiment for dexamethasone (DECADRON) 2 MG tablet. Did not relief my migraine. Would like a call from the nurse.

## 2020-11-29 ENCOUNTER — Other Ambulatory Visit: Payer: Self-pay | Admitting: Critical Care Medicine

## 2020-11-29 MED ORDER — DEXAMETHASONE 2 MG PO TABS: ORAL_TABLET | ORAL | 0 refills | Status: DC

## 2020-11-29 NOTE — Telephone Encounter (Signed)
Requested medication (s) are due for refill today: no  Requested medication (s) are on the active medication list: yes  Last refill: 07/12/2020  Future visit scheduled: no  Notes to clinic:  this refill cannot be delegated    Requested Prescriptions  Pending Prescriptions Disp Refills   dexamethasone (DECADRON) 2 MG tablet 6 tablet 0    Sig: Keep on hand if needed for acute headache, take 3, then take 2, then take 1      Not Delegated - Endocrinology:  Oral Corticosteroids Failed - 11/29/2020  9:54 AM      Failed - This refill cannot be delegated      Passed - Last BP in normal range    BP Readings from Last 1 Encounters:  09/25/20 137/89          Passed - Valid encounter within last 6 months    Recent Outpatient Visits           2 months ago COPD with chronic bronchitis ([place])   [place], Patrick E, MD   4 months ago Essential hypertension   [place] Elsie Stain, MD   8 months ago Coronary artery disease of native artery of native heart with stable angina pectoris [facility])   [facility] Elsie Stain, MD   1 year ago COPD with chronic bronchitis [facility])   [place] Elsie Stain, MD   1 year ago Chronic insomnia   [place] Elsie Stain, MD

## 2020-11-29 NOTE — Telephone Encounter (Signed)
Copied from [place] [phone]. Topic: Quick Communication - Rx Refill/Question >> Nov 29, 2020  9:50 AM Leward Quan A wrote: Medication: dexamethasone (DECADRON) 2 MG tablet   Has the patient contacted their pharmacy? Yes.   (Agent: If no, request that the patient contact the pharmacy for the refill.) (Agent: If yes, when and what did the pharmacy advise?)  Preferred Pharmacy (with phone number or street name): [facility] [phone] - [place], [address]  Phone:  [phone] Fax:  [phone]     Agent: Please be advised that RX refills may take up to 3 business days. We ask that you follow-up with your pharmacy.

## 2020-12-04 ENCOUNTER — Other Ambulatory Visit: Payer: Self-pay | Admitting: Critical Care Medicine

## 2020-12-13 ENCOUNTER — Ambulatory Visit (INDEPENDENT_AMBULATORY_CARE_PROVIDER_SITE_OTHER): Payer: Medicare Other | Admitting: Licensed Clinical Social Worker

## 2020-12-13 DIAGNOSIS — F4001 Agoraphobia with panic disorder: Secondary | ICD-10-CM
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
## 2021-01-19 ENCOUNTER — Other Ambulatory Visit (HOSPITAL_COMMUNITY): Payer: Self-pay | Admitting: Psychiatry

## 2021-01-25 ENCOUNTER — Telehealth (HOSPITAL_COMMUNITY): Payer: Medicare Other | Admitting: Psychiatry

## 2021-01-28 ENCOUNTER — Other Ambulatory Visit: Payer: Self-pay | Admitting: Critical Care Medicine

## 2021-01-28 NOTE — Telephone Encounter (Signed)
Requested medication (s) are due for refill today: yes  Requested medication (s) are on the active medication list: yes  Last refill:  07/12/20  Future visit scheduled: no  Notes to clinic:  not delegated    Requested Prescriptions  Pending Prescriptions Disp Refills   ondansetron (ZOFRAN-ODT) 4 MG disintegrating tablet [Pharmacy Med Name: ONDANSETRON ODT 4MG  TABLETS] 12 tablet 0    Sig: DISSOLVE 1 TABLET(4 MG) ON THE TONGUE EVERY 8 HOURS AS NEEDED FOR NAUSEA OR VOMITING      Not Delegated - Gastroenterology: Antiemetics Failed - 01/28/2021 10:09 AM      Failed - This refill cannot be delegated      Passed - Valid encounter within last 6 months    Recent Outpatient Visits           4 months ago COPD with chronic bronchitis ([place])   [place] Elsie Stain, MD   6 months ago Essential hypertension   [place] Elsie Stain, MD   10 months ago Coronary artery disease of native artery of native heart with stable angina pectoris [facility])   [facility] Elsie Stain, MD   1 year ago COPD with chronic bronchitis [facility])   [place] Elsie Stain, MD   1 year ago Chronic insomnia   [place], Patrick E, MD

## 2021-02-25 ENCOUNTER — Ambulatory Visit (INDEPENDENT_AMBULATORY_CARE_PROVIDER_SITE_OTHER): Payer: Medicare Other | Admitting: Neurology

## 2021-02-25 ENCOUNTER — Encounter: Payer: Self-pay | Admitting: Neurology

## 2021-02-25 VITALS — BP 115/70 | HR 55 | Ht 72.0 in | Wt 167.0 lb

## 2021-02-25 DIAGNOSIS — R569 Unspecified convulsions: Secondary | ICD-10-CM | POA: Diagnosis not present

## 2021-02-25 DIAGNOSIS — G43711 Chronic migraine without aura, intractable, with status migrainosus: Secondary | ICD-10-CM | POA: Diagnosis not present

## 2021-02-25 MED ORDER — DEXAMETHASONE 2 MG PO TABS: ORAL_TABLET | ORAL | 0 refills | Status: DC

## 2021-02-25 MED ORDER — LEVETIRACETAM 1000 MG PO TABS: 1000.0000 mg | ORAL_TABLET | Freq: Two times a day (BID) | ORAL | 11 refills | Status: DC

## 2021-02-25 NOTE — Progress Notes (Signed)
I have read the note, and I agree with the clinical assessment and plan.  Ridhi Hoffert K Orland Visconti   

## 2021-02-25 NOTE — Progress Notes (Signed)
PATIENT: Spencer Mendoza DOB: 1960/11/22  REASON FOR VISIT: follow up HISTORY FROM: patient  HISTORY OF PRESENT ILLNESS: Today 02/25/21 Spencer Mendoza is a 60 year old male with history of episodic seizures associated with generalized tonic-clonic events, bipolar disorder, and migraine headaches.  Remains on Keppra 1000 mg twice daily, denies any seizure events in the last 6 months.  On average, 2 migraines a month, he keeps a Decadron taper on hand in the event of a prolonged headache, for mild headache takes Tylenol or BCs.  Claims he may only use once Decadron every other month.  Was last sent in January by his PCP.  Botox has been most beneficial, but he has not been able to afford the Botox co-pay.  Claims his mental health is doing well, can no longer get Xanax.  Takes BuSpar.  He works a few days a month, directing traffic on a Architect site.  He is involved in his church.  He presents today for evaluation accompanied by his fiance, [name].  Update 08/27/2020 SS: Spencer Mendoza is a 60 year old male with history of episodic seizures associated with generalized tonic-clonic events, bipolar disorder, and migraine headaches.  He is on trazodone 200 mg at bedtime, Keppra 1000 mg twice a day, and received Botox therapy.  At last visit, Keppra was increased due to reported seizure 02/15/2020.  No seizure since.  Has had weight loss, has stabilized around 152 lbs, was 155 lbs 6 month ago.  Had colonoscopy, a few precancer polyps removed.  No etiology for weight loss found.  Recently having significant panic attacks, PCP put him on a short course of Xanax PRN, seen new [facility] psychiatry in November.  Not working, on disability.  More headache, could not afford Botox co-pay, last Botox in March 2021, Botox was really beneficial for him.  Continues to smoke cigarettes, we talked about history of epilepsy versus pseudoseizures, indicates epilepsy since he was born, his mom had epilepsy.  No EtOH in  over 1 year.  Here today accompanied by his significant other.  HISTORY 02/23/2020 SS: Spencer Mendoza is a 60 year old male with history of episodic seizures associated with generalized tonic-clonic events, bipolar disorder, and migraine headache.  He is taking trazodone 200 mg at bedtime, Keppra 750 mg twice a day.  He received Botox therapy in November and March for migraines.  Botox has been very helpful for his headaches, did have 1 severe headache following his Botox injection in March, keeps Decadron taper on hand, took this with good benefit once in March.  Overall, headaches doing well.  He may have had a seizure 02/15/2020, he thinks that he did, is unclear of details, worked that day, was stressed out, came home, had preseizure feeling, laid down, unclear what happened next or does not want to elaborate.  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 Yesterday complained of chest pain, pain in his left arm, shortness of breath, vomiting.  Denies any EtOH, drug use.  Is smoking half pack a day.  In less than 2 months, has lost about 15 pounds unintentionally.  He presents today accompanied by his fiance [name].  REVIEW OF SYSTEMS: Out of a complete 14 system review of symptoms, the patient complains only of the following symptoms, and all other reviewed systems are negative.  Headache, seizure  ALLERGIES: Allergies  Allergen Reactions  . Sertraline Other (See Comments)  Erectile dysfunction  . Codeine Nausea And Vomiting  . Depakote [Divalproex Sodium] Other (See Comments)    Made patient shake  . Migranal [Dihydroergotamine] Nausea And Vomiting  . Topamax [Topiramate] Other (See Comments)    Made patient angry    HOME MEDICATIONS: Outpatient Medications Prior to Visit  Medication Sig Dispense Refill  . albuterol (VENTOLIN HFA) 108 (90 Base) MCG/ACT inhaler INHALE 2 PUFFS BY  MOUTH INTO THE LUNGS EVERY 4 HOURS AS NEEDED FOR SHORTNESS OF BREATH OR WHEEZING 8.5 g 3  . aspirin EC 81 MG tablet Take 1 tablet (81 mg total) by mouth daily. Swallow whole. 30 tablet 11  . busPIRone (BUSPAR) 7.5 MG tablet TAKE 1 TABLET(7.5 MG) BY MOUTH DAILY 30 tablet 1  . folic acid (FOLVITE) 1 MG tablet Take 1 tablet (1 mg total) by mouth daily. 30 tablet 3  . ondansetron (ZOFRAN-ODT) 4 MG disintegrating tablet DISSOLVE 1 TABLET(4 MG) ON THE TONGUE EVERY 8 HOURS AS NEEDED FOR NAUSEA OR VOMITING 12 tablet 0  . rosuvastatin (CRESTOR) 10 MG tablet Take 1 tablet (10 mg total) by mouth daily. 90 tablet 3  . SYMBICORT 160-4.5 MCG/ACT inhaler INHALE 2 PUFFS INTO THE LUNGS TWICE DAILY 10.2 g 5  . thiamine 100 MG tablet Take 1 tablet (100 mg total) by mouth daily. 30 tablet 3  . traZODone (DESYREL) 100 MG tablet TAKE 2 TABLETS(200 MG) BY MOUTH AT BEDTIME 60 tablet 2  . dexamethasone (DECADRON) 2 MG tablet Keep on hand if needed for acute headache, take 3, then take 2, then take 1 6 tablet 0  . levETIRAcetam (KEPPRA) 1000 MG tablet Take 1 tablet (1,000 mg total) by mouth 2 (two) times daily. 60 tablet 11  . ALPRAZolam (XANAX) 0.25 MG tablet Take 1 tablet (0.25 mg total) by mouth at bedtime as needed for anxiety. 30 tablet 0  . fluticasone (FLONASE) 50 MCG/ACT nasal spray Place 2 sprays into both nostrils daily. 16 g 6   No facility-administered medications prior to visit.    PAST MEDICAL HISTORY: Past Medical History:  Diagnosis Date  . Alcohol withdrawal ([place]) 07/29/2013  . Allergy   . Anxiety   . Benzodiazepine abuse ([place]) 08/30/2019  . Bipolar disorder ([place])   . Chronic insomnia 06/06/2015  . Chronic migraine without aura, with intractable migraine, so stated, with status migrainosus 08/25/2019  . Claustrophobia   . Cocaine abuse ([place]) 05/22/2015  . Delirium tremens ([place]) 07/29/2013  . Depression   . Epilepsy ([place])    last seizure week of 06-25-2020- sev. grand mal seizures per pt   .  Hemiplegic migraine with status migrainosus 06/06/2015  . Hypertension   . Migraine   . Opioid use disorder 08/30/2019  . PONV (postoperative nausea and vomiting)   . Stroke [facility])    patient denies- states had a hemiplegic migraine in ~2011  . Substance abuse ([place])     PAST SURGICAL HISTORY: Past Surgical History:  Procedure Laterality Date  . ANKLE SURGERY     in high school  . APPENDECTOMY    . COLONOSCOPY    . KNEE ARTHROSCOPY Right   . NOSE SURGERY      FAMILY HISTORY: Family History  Problem Relation Age of Onset  . Heart disease Mother   . Stroke Mother   . Epilepsy Mother   . Heart disease Father   . Hypertension Father   . Melanoma Father   . Migraines Father   . Colon cancer Sister 70  . Colon  cancer Cousin   . Colon polyps Neg Hx   . Esophageal cancer Neg Hx   . Rectal cancer Neg Hx   . Stomach cancer Neg Hx     SOCIAL HISTORY: Social History   Socioeconomic History  . Marital status: Legally Separated    Spouse name: Not on file  . Number of children: 0  . Years of education: GED  . Highest education level: Not on file  Occupational History  . Occupation: disabled  Tobacco Use  . Smoking status: Current Every Day Smoker    Packs/day: 0.50    Years: 15.00    Pack years: 7.50    Types: Cigarettes  . Smokeless tobacco: Never Used  Vaping Use  . Vaping Use: Never used  Substance and Sexual Activity  . Alcohol use: Not Currently    Alcohol/week: 6.0 standard drinks    Types: 6 Cans of beer per week    Comment: none since Sept 2020  . Drug use: Yes    Types: Marijuana    Comment: hx of crack, heroin; only THC since Sept 2020  . Sexual activity: Yes    Birth control/protection: Condom  Other Topics Concern  . Not on file  Social History Narrative   Pt lives with sister.   Patient drinks 2 cups of caffeine daily.   Patient is right handed.   Social Determinants of Health   Financial Resource Strain: Not on file  Food Insecurity: Not on  file  Transportation Needs: Not on file  Physical Activity: Not on file  Stress: Not on file  Social Connections: Not on file  Intimate Partner Violence: Not on file   PHYSICAL EXAM  Vitals:   02/25/21 0808  BP: 115/70  Pulse: (!) 55  Weight: 167 lb (75.8 kg)  Height: 6' (1.829 m)   Body mass index is 22.65 kg/m.  Generalized: Well developed, in no acute distress  Neurological examination  Mentation: Alert oriented to time, place, history taking. Follows all commands speech and language fluent Cranial nerve II-XII: Pupils were equal round reactive to light. Extraocular movements were full, visual field were full on confrontational test. Facial sensation and strength were normal. Head turning and shoulder shrug were normal and symmetric. Motor: The motor testing reveals 5 over 5 strength of all 4 extremities. Good symmetric motor tone is noted throughout.  Sensory: Sensory testing is intact to soft touch on all 4 extremities. No evidence of extinction is noted.  Coordination: Cerebellar testing reveals good finger-nose-finger and heel-to-shin bilaterally.   Gait and station: Gait is normal.  Tandem gait is normal. Reflexes: Normal 2+ DIAGNOSTIC DATA (LABS, IMAGING, TESTING) - I reviewed patient records, labs, notes, testing and imaging myself where available.  Lab Results  Component Value Date   WBC 10.8 07/13/2020   HGB 16.3 07/13/2020   HCT 47.3 07/13/2020   MCV 94 07/13/2020   PLT 312 07/13/2020      Component Value Date/Time   NA 139 07/13/2020 0921   K 4.4 07/13/2020 0921   CL 97 07/13/2020 0921   CO2 25 07/13/2020 0921   GLUCOSE 88 07/13/2020 0921   GLUCOSE 64 (L) 02/28/2020 0959   BUN 11 07/13/2020 0921   CREATININE 1.02 07/13/2020 0921   CREATININE 0.99 03/13/2015 1004   CALCIUM 10.4 (H) 07/13/2020 0921   PROT 7.0 07/13/2020 0921   ALBUMIN 5.0 (H) 07/13/2020 0921   AST 22 07/13/2020 0921   ALT 24 07/13/2020 0921   ALKPHOS 79 07/13/2020 0921  BILITOT 0.5  07/13/2020 0921   GFRNONAA 79 07/13/2020 0921   GFRNONAA 85 03/13/2015 1004   GFRAA 91 07/13/2020 0921   GFRAA >89 03/13/2015 1004   Lab Results  Component Value Date   CHOL 163 07/13/2020   HDL 101 07/13/2020   LDLCALC 50 07/13/2020   TRIG 58 07/13/2020   CHOLHDL 1.6 07/13/2020   Lab Results  Component Value Date   HGBA1C 4.9 02/01/2018   No results found for: PRXYVOPF29 Lab Results  Component Value Date   TSH 3.310 03/07/2020   ASSESSMENT AND PLAN 60 y.o. year old male  has a past medical history of Alcohol withdrawal ([place]) (07/29/2013), Allergy, Anxiety, Benzodiazepine abuse ([place]) (08/30/2019), Bipolar disorder ([place]), Chronic insomnia (06/06/2015), Chronic migraine without aura, with intractable migraine, so stated, with status migrainosus (08/25/2019), Claustrophobia, Cocaine abuse ([place]) (05/22/2015), Delirium tremens ([place]) (07/29/2013), Depression, Epilepsy ([place]), Hemiplegic migraine with status migrainosus (06/06/2015), Hypertension, Migraine, Opioid use disorder (08/30/2019), PONV (postoperative nausea and vomiting), Stroke ([place]), and Substance abuse ([place]). here with:  1.  History of seizures -Last seizure 02/15/2020, on Keppra 750 mg BID -Continue Keppra 1000 mg twice a day, no recurrent seizure  2.  Alcohol abuse, marijuana abuse -Denies as of recent  3.  Chronic insomnia -Doing well with trazodone, continue 200 mg at bedtime  4.  Chronic intractable migraine headache -Excellent benefit with Botox, last in March 2021 -Reportedly cannot afford co-pay, no other savings options available -Sent in Decadron taper for prolonged headache PRN -Cautioned on potential for rebound headache with frequent BC -Claims has tried triptans in past without benefit (Imitrex, Maxalt), history of migraines with hemiplegic symptoms, has tried Toradol, diclofenac potassium; we may need to get coverage of Ubrelvy or Nurtec for acute headache treatment in the future  5.  Anxiety, bipolar  disorder -Sees psychiatry, on BuSpar  Follow-up in 8 months or sooner if needed, call for recurrent seizure.  I spent 30 minutes of face-to-face and non-face-to-face time with patient.  This included previsit chart review, lab review, study review, order entry, electronic health record documentation, patient education.  Butler Denmark, AGNP-C, DNP 02/25/2021, 8:38 AM [facility] [address], [place], [place] [address] [phone]

## 2021-02-25 NOTE — Patient Instructions (Signed)
Continue current medications Call for seizures, worsening headache see you back in 8 months

## 2021-03-18 ENCOUNTER — Other Ambulatory Visit: Payer: Self-pay | Admitting: Critical Care Medicine

## 2021-03-18 NOTE — Telephone Encounter (Signed)
Requested medication (s) are due for refill today: yes  Requested medication (s) are on the active medication list: yes  Last refill:  12/22/20  Future visit scheduled: yes  Notes to clinic:  d/c'd in error 02/25/21    Requested Prescriptions  Pending Prescriptions Disp Refills   fluticasone (FLONASE) 50 MCG/ACT nasal spray [Pharmacy Med Name: FLUTICASONE 50MCG NAS SP(120SP) RX] 16 g 6    Sig: SHAKE LIQUID AND USE 2 SPRAYS IN EACH NOSTRIL DAILY      Ear, Nose, and Throat: Nasal Preparations - Corticosteroids Passed - 03/18/2021  6:38 AM      Passed - Valid encounter within last 12 months    Recent Outpatient Visits           5 months ago COPD with chronic bronchitis ([place])   [place] Elsie Stain, MD   8 months ago Essential hypertension   [place], MD   12 months ago Coronary artery disease of native artery of native heart with stable angina pectoris [facility])   [place] Elsie Stain, MD   1 year ago COPD with chronic bronchitis [facility])   [place] Elsie Stain, MD   1 year ago Chronic insomnia   [place], MD       Future Appointments             Tomorrow Elsie Stain, MD [place]

## 2021-03-19 ENCOUNTER — Other Ambulatory Visit: Payer: Self-pay

## 2021-03-19 ENCOUNTER — Encounter: Payer: Self-pay | Admitting: Critical Care Medicine

## 2021-03-19 ENCOUNTER — Ambulatory Visit: Payer: Medicare Other | Attending: Critical Care Medicine | Admitting: Critical Care Medicine

## 2021-03-19 VITALS — BP 120/76 | HR 60 | Resp 16 | Ht 72.0 in | Wt 166.4 lb

## 2021-03-19 DIAGNOSIS — F5104 Psychophysiologic insomnia: Secondary | ICD-10-CM

## 2021-03-19 DIAGNOSIS — R569 Unspecified convulsions: Secondary | ICD-10-CM | POA: Diagnosis not present

## 2021-03-19 DIAGNOSIS — I25118 Atherosclerotic heart disease of native coronary artery with other forms of angina pectoris: Secondary | ICD-10-CM

## 2021-03-19 DIAGNOSIS — J449 Chronic obstructive pulmonary disease, unspecified: Secondary | ICD-10-CM

## 2021-03-19 DIAGNOSIS — I10 Essential (primary) hypertension: Secondary | ICD-10-CM | POA: Diagnosis not present

## 2021-03-19 DIAGNOSIS — F332 Major depressive disorder, recurrent severe without psychotic features: Secondary | ICD-10-CM

## 2021-03-19 DIAGNOSIS — I700 Atherosclerosis of aorta: Secondary | ICD-10-CM | POA: Diagnosis not present

## 2021-03-19 DIAGNOSIS — Z720 Tobacco use: Secondary | ICD-10-CM | POA: Diagnosis not present

## 2021-03-19 MED ORDER — THIAMINE HCL 100 MG PO TABS: 100.0000 mg | ORAL_TABLET | Freq: Every day | ORAL | 3 refills | Status: DC

## 2021-03-19 MED ORDER — FOLIC ACID 1 MG PO TABS: 1.0000 mg | ORAL_TABLET | Freq: Every day | ORAL | 3 refills | Status: DC

## 2021-03-19 MED ORDER — TRAZODONE HCL 100 MG PO TABS: ORAL_TABLET | ORAL | 2 refills | Status: DC

## 2021-03-19 MED ORDER — ROSUVASTATIN CALCIUM 10 MG PO TABS: 10.0000 mg | ORAL_TABLET | Freq: Every day | ORAL | 3 refills | Status: DC

## 2021-03-19 MED ORDER — BUDESONIDE-FORMOTEROL FUMARATE 160-4.5 MCG/ACT IN AERO: 2.0000 | INHALATION_SPRAY | Freq: Two times a day (BID) | RESPIRATORY_TRACT | 5 refills | Status: DC

## 2021-03-19 MED ORDER — ALPRAZOLAM 0.25 MG PO TABS: 0.2500 mg | ORAL_TABLET | Freq: Three times a day (TID) | ORAL | 0 refills | Status: DC | PRN

## 2021-03-19 MED ORDER — BUSPIRONE HCL 7.5 MG PO TABS: ORAL_TABLET | ORAL | 1 refills | Status: DC

## 2021-03-19 MED ORDER — ALBUTEROL SULFATE HFA 108 (90 BASE) MCG/ACT IN AERS: INHALATION_SPRAY | RESPIRATORY_TRACT | 3 refills | Status: DC

## 2021-03-19 NOTE — Assessment & Plan Note (Signed)
Smoking continue about a half a pack a day will prescribe nicotine replacement therapy for the patient     . Current smoking consumption amount: 1/2 pack a day  . Dicsussion on advise to quit smoking and smoking impacts: Effects on neurologic cardiovascular lung health  . Patient's willingness to quit: Willing to try to quit  Methods to quit smoking discussed: behav mod . Medication management of smoking session drugs discussed:failed nicotine replacement  .   [name] Setting quit date no quit date set  . Follow-up arranged  4 month follow-up   Time spent counseling the patient: 5 minutes

## 2021-03-19 NOTE — Assessment & Plan Note (Signed)
Continue aspirin continue statin therapy

## 2021-03-19 NOTE — Assessment & Plan Note (Signed)
Smoking cessation advised Refill Symbicort and albuterol

## 2021-03-19 NOTE — Assessment & Plan Note (Signed)
Well-controlled at this time No changes in medications

## 2021-03-19 NOTE — Assessment & Plan Note (Signed)
Continue Keppra plan per neurology

## 2021-03-19 NOTE — Progress Notes (Signed)
Pt wants refill on Zofran.

## 2021-03-19 NOTE — Assessment & Plan Note (Signed)
Continue aspirin and statin therapy

## 2021-03-19 NOTE — Assessment & Plan Note (Signed)
Continue trazodone 

## 2021-03-19 NOTE — Patient Instructions (Signed)
Your medication refill sent to your [facility] pharmacy  I gave you 90 0.25 mg Xanax is to take 3 times daily as needed for anxiety  We will make another referral to a different psychiatry site for you  Keep your appointments with Dr. Jannifer Franklin in neurology  You deferred further COVID vaccinations if you begin develop COVID infection please let us know we have treatments for this now  You deferred the tetanus shot for now  Return to see Dr. Joya Gaskins 4 months

## 2021-03-19 NOTE — Progress Notes (Signed)
Subjective:    Patient ID: Spencer Mendoza, male    DOB: 26-Nov-1960, 60 y.o.   MRN: [id number]  History of Present Illness: First OV 09/16/19 This is a 60 year old white male history of episodic seizures associated with tonic-clonic events and urinary incontinence.  History also of heavy ethanol use and severe generalized anxiety disorder with claustrophobia.  Patient has significant insomnia as well.  The patient has also chronic history of migraines been on multiple medications for this in the past as well.  Patient has a history of substance use including opioids in the past as well.  Patient also uses marijuana at this time.  Patient has history of bipolar disorder and severe depression.  Patient presents today post emergency room visit to establish for primary care.  The patient had been in this clinic previously 5 years ago and wishes to come back to the clinic now to establish.  There is also prior history of stroke.  Also history of nausea vomiting and diarrhea in the past.  The patient was seen in the emergency room on 24 September for similar complaints.  Below is the office visit with neurology on October 8  08/25/19 OV with Neurology Spencer Mendoza is a 60 year old right-handed white male with a history of episodic seizures associated with generalized tonic-clonic events and occasional urinary incontinence.  The patient has had significant issues with alcohol abuse, he may drink up to 12 beers a day.  He has been in the hospital in June, and on 17 September with seizures, he was in the hospital on 11 August 2019 with gastroenteritis, a CT scan of the abdomen at that time showed a low-density area in the liver that was new, he will be evaluated for possible liver cancer in the near future.  The patient has a history of bipolar disorder, he is not followed through psychiatry.  He reports a lot of problems with claustrophobia and GAD.  He is not sleeping well, he indicates that severe  insomnia may worsen his seizure control.  He is on trazodone 100 mg at night but this is not helpful.  The patient continues to have headaches almost daily or every other day, he may have a severe headache at least once a week.  He has been on a multitude of medications in the past for migraine including propranolol, amitriptyline, Topamax, Depakote, Effexor, gabapentin, Seroquel, Prozac, and Aimovig.  None of these modalities have been effective in controlling his headache.  The patient returns for an evaluation.   1.  History of seizures  2.  Alcohol abuse, marijuana abuse  3.  Chronic insomnia  4.  Chronic intractable migraine  5.  Anxiety, bipolar disorder  The patient will be increased on the trazodone to 150 mg at night, he will call for any dose adjustments.  He will be set up for Botox therapy.  The patient will go up on the Keppra dosing to 750 mg twice daily.  He will follow-up here in 6 months, likely be seen sooner for Botox therapy.  Below is the emergency room visit September 24 In ED 9/24: Spencer Mendoza is a 60 y.o. male with history of bipolar, epilepsy, hypertension, migraine, CVA, substance abuse, EtOH abuse presents today for abdominal pain nausea vomiting diarrhea and rash.  Patient reports that on Sunday evening he ate shrimp that he found in his refrigerator.  He reports that he was unaware of it at that time but shrimp had been in the refrigerator for  greater than 12 days.  He reports that on Sunday night he developed abdominal pain and diarrhea.  He describes a generalized abdominal pain a severe cramping sensation constant without aggravating or alleviating symptoms or radiation.  Abdominal pain has been constant for the past 4 days.  Patient reports that on Monday morning he began taking a new medication prescribed by his neurologist, Lamictal for seizures in addition to his normal Keppra.  He reports that shortly after taking the Lamictal on Monday morning  his diarrhea improved but was followed by nausea and vomiting.  He describes multiple episodes of nonbloody/nonbilious emesis over the past 3 days with continued abdominal pain.  Patient reports that on Tuesday the day after he began taking Lamictal he developed a diffuse red rash to his torso that has remained constant since that time.  Reports feeling warm and cold but denies measured fever at home.  Denies headache/vision changes, neck pain, difficulty swallowing, feeling of throat closing, burning of the mouth, chest pain/shortness of breath, dysuria/hematuria, fall/injury or any additional concerns.   On initial evaluation patient is overall well-appearing and in no acute distress, not actively vomiting.  He has mild abdominal tenderness generalized without peritoneal signs.  He has a diffuse mild erythematous rash of the anterior torso.  He has no airway or mucosal involvement.  Discussed case with Dr. Roslynn Amble, discussed possible etiologies such as squamoid poisoning, feel this is unlikely based on history and physical examination but will give Benadryl today.  He did begin taking Lamictal just prior to onset of his rash, review of notes from [facility] neurology show that they recommended that he stop taking Lamictal today but continue with his Keppra. - Patient seen and evaluated by Dr. Roslynn Amble advises that if no acute findings on CT abdomen pelvis will discharge with Benadryl and outpatient follow-up. - CBC with extensions of 11.4 Lipase within normal limits CMP with anion gap 16, suspect secondary to dehydration Urinalysis with ketones and elevated specific gravity, suspect secondary to dehydration Ethanol negative, patient does not appear to be in alcohol withdrawal  CT abdomen pelvis:  IMPRESSION: No acute CT finding to account for abdominal pain.  There is a small hypodense/hypoenhancing lesion within segment 7 of the liver which was not present on the comparison noncontrast  CT. The enhancement characteristics are suspicious, and while this could represent a vascular lesion, malignancy (either primary or metastatic) cannot be excluded. Nonemergent follow-up contrast-enhanced liver protocol MRI is recommended. These results were discussed by telephone at the time of interpretation on 08/11/2019 at 5:21 pm with Dr. Nuala Alpha.  Aortic Atherosclerosis (ICD10-I70.0). - Discussed case with in-house pharmacy who advises that patient take Benadryl 25 mg twice daily x7 days for his rash and discontinue lamotrigine, follow-up for recheck early next week. - Patient reevaluated resting comfortably and in no acute distress.  On reevaluation abdomen is soft without peritoneal signs.  Patient has been updated on results today and he states understanding of need for follow-up.  He will be given referral to gastroenterologist for further evaluation encouraged to call office tomorrow morning.  He will be discharged with Zofran for nausea and vomiting.  Patient advised to stop taking lamotrigine immediately and to call his neurologist tomorrow morning to advise them of this.  He has Keppra at home that he will continue taking for his symptoms.  Patient does not appear to be having Stevens-Johnson syndrome at this time, he is overall well-appearing and in no acute distress.  He is advised to  take Benadryl 25 mg twice daily x7 days and to follow-up with neurologist and PCP on Monday.  Note the patient does need a follow-up MRI of the liver because of the liver lesion seen which could represent a malignancy or metastatic lesion.  Contrast-enhanced liver protocol MRI was recommended.  The patient now has been sober for 3 weeks.  Is not drinking alcohol currently.  This patient had been previously in mental health programs for drug substance use and depression.  He has been on multiple depression medications in the past.  He has been on trazodone for sleep in the past.  He also has been  on Prozac.  He also used IV heroin in the past and also cocaine and Dilaudid in the past.  He does obtain off the street Xanax and uses this as needed at home.   10/03/2019 This patient is seen today in return follow-up from the mid October visit as documented above.  The patient currently is not drinking alcohol at this time.  He still suffers from significant depression and anxiety.  We tried sertraline on him 50 mg daily but he experienced erectile dysfunction and had to stop this medication.  He is continue the thiamine 756 mg daily folic acid 1 mg daily.  The patient is currently not on treatment for hypertension as his blood pressures been well controlled.  Note this patient underwent an MRI of the liver to look for any abnormalities in the liver when an abdominal CT scan suggested potential liver cancer.  The MRI in fact showed a benign hepatic hemangioma and nothing else.  I went over with the patient this result previously on the phone today we went over this visit the results as well face-to-face.  The patient states he still has significant anxiety and depression and has not been able to achieve a psychiatry appointment as of yet.  He did meet with the licensed Education officer, [employer]. The patient is still smoking a pack a day of cigarettes.  He has not attempted to use nicotine to replace cigarettes as of yet.  He is maintaining his inhalers for COPD. Patient does complain of significant ongoing insomnia though he is on the trazodone 200 mg a day he gets him to sleep however he awakens in the middle the night with some choking sensations. The patient has completely stopped drinking alcohol  11/09/2019 This is a follow-up telephone visit for this patient who was last seen in November.  The patient has recently received Botox injections for his migraine syndrome and this is markedly improved his symptoms.  He maintains his seizure medications as well per neurology.  He is still smoking about half a pack a  day of cigarettes.  He complains of increased cough increased sinus pressure increased sneezing low-grade fever dizziness.  He was Covid + November 22 and at that time had more gastrointestinal symptoms.  He had a girlfriend was positive as well. He has yet to achieve a psychiatry visit for his major depressive disorder.  His insurance did not approve the referrals we made previously.  Also the patient continues with his insomnia and hypersomnolence during the day and is yet to achieve a sleep study due to barriers from [employer] as well.  He notes thick yellow mucus coming out of the nose at this time. The patient is using the Symbicort inhaler as prescribed   11/30/2019 The patient is seen today and is in very good spirits and in much improved mood and  affect.  He states the trazodone has helped him at night during sleep.  He has seen a mental health provider at [facility] at this time and is awaiting further recommendations from [facility] on mental health therapy.  He has a follow-up visit with a therapist coming up soon.  The patient's migraine headaches are significantly improved and stable at this time.  In addition the patient has been abstinent from alcohol for over 9 weeks.  Patient is still smoking small amounts and is interested in further smoking cessation assistance.  He does have good breathing at this time and is on the Symbicort.  There have been no additional seizures and he maintains on Keppra and as well the patient is on folic acid and thiamine while off alcohol   There has not been any additional abdominal pain since the last visit  03/20/2020 Patient seen in return follow-up with history of hypertension and now diagnosed calcium deposits in the coronaries as well as aortic atherosclerosis with 50% occlusion at the distal aorta.  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  Patient also had presyncopal episodes as well.  He continues have chronic insomnia however trazodone has been somewhat of help.  He is yet to have a sleep study as we have requested previously.  Patient is still smoking about a half a pack a day of cigarettes and we discussed the need to reduce smoking previously.  Patient states his mood disorder has stabilized and does have follow-up with his mental health provider. The patient did go to the emergency room when he had increased chest discomfort with negative troponins negative EKG however has since seen cardiology and they are scheduling him for coronary CT scan along with a long-term rhythm monitor.  The patient needs to be on statins but with multiple other issues going on at this time we have held off on this also he needs a Covid vaccine but again elected to hold off on this during his current symptom complex  07/12/2020 This patient was seen today in return follow-up by way of a telephone visit.  This patient's been noting increased cough shortness of breath and lightheadedness with temp up to 101 over the past week.  He went and had a Covid test which was negative.  He has had also some chest pain in the front of his chest.  He is not using alcohol or cocaine he is reduced his marijuana intake because it irritates his breathing.  He is using his inhalers.  He smoking about 1/2 pack a day of cigarettes.  The cough is productive of thick yellow mucus with wheezing associated  From a mental health perspective he has a follow-up appointment within the next month and is wishing to be bridged with Xanax until he gets in with the mental health provider.  He has been on Xanax in the past and this helps his panic attacks he takes 0.5 mg 1-2 times daily and only uses it as needed  The patient has a history of migraines but cannot afford the Botox is not taking this medication at this time  The patient's not had  any recent seizures.  The patient had tried the Wellbutrin but did not help him and he is off this medication at this time hydroxyzine also was no help with his anxiety  He does maintain  the Symbicort inhaler maintains Keppra Rova statin trazodone he is in need of refills on these medications Abdominal aortic atherosclerosis ([place]) with 50% occlusion distal aorta Patient has abdominal aortic atherosclerosis with 50% occlusion of the aorta not a good candidate for statins due to muscular side effects of other medications  Plan will be to refer patient to vascular surgery for further evaluation   11/9 Patient seen return follow-up underlying COPD chronic anxiety depression now has access to health insurance.  His tobacco use is down to less than half a pack a day.  The patient states that his cough is slightly increased productive of thick yellow mucus.  He has increased sinus congestion.  He is on the Xanax and he is stretching this out it does help to some degree.  He has an appointment at [facility] soon.  He has been free of alcohol now for 14 months.  He is trying to cut back on the tobacco use.  He will get a flu vaccine but wants to learn more about the Covid vaccine first as he has been reluctant to obtain the Covid vaccine  03/19/2021 This is a 60 year old male seen today in return follow-up for severe anxiety panic disorder hypertension atherosclerotic disease with coronary disease and aortic  atherosclerosis and COPD chronic bronchitis major depression ongoing tobacco use  Note patient does not drink alcohol but he does occasionally use marijuana.  He ran out of his Xanax 25 days ago and has been having difficult time he does see a mental health provider who does not prescribe Xanax because he uses marijuana.  He wishes a second opinion.  Patient states that shortness of breath is at baseline.  On arrival blood pressure is 120/76.  Note he only received 1 vaccine for COVID Moderna states he does  not wish to pursue this vaccine series any further and was made aware of COVID precautions.  Also wishes to defer his tetanus vaccine at this visit.  No other complaints are present at this time.   Past Medical History:  Diagnosis Date  . Alcohol withdrawal ([place]) 07/29/2013  . Allergy   . Anxiety   . Benzodiazepine abuse ([place]) 08/30/2019  . Bipolar disorder ([place])   . Chronic insomnia 06/06/2015  . Chronic migraine without aura, with intractable migraine, so stated, with status migrainosus 08/25/2019  . Claustrophobia   . Cocaine abuse ([place]) 05/22/2015  . Delirium tremens ([place]) 07/29/2013  . Depression   . Epilepsy ([place])    last seizure week of 06-25-2020- sev. grand mal seizures per pt   . Hemiplegic migraine with status migrainosus 06/06/2015  . Hypertension   . Migraine   . Opioid use disorder 08/30/2019  . PONV (postoperative nausea and vomiting)   . Stroke [facility])    patient denies- states had a hemiplegic migraine in ~2011  . Substance abuse ([place])      Family History  Problem Relation Age of Onset  . Heart disease Mother   . Stroke Mother   . Epilepsy Mother   . Heart disease Father   . Hypertension Father   . Melanoma Father   . Migraines Father   . Colon cancer Sister 72  . Colon cancer Cousin   . Colon polyps Neg Hx   . Esophageal cancer Neg Hx   . Rectal cancer Neg Hx   . Stomach cancer Neg Hx      Social History   Socioeconomic History  . Marital status: Legally Separated    Spouse  name: Not on file  . Number of children: 0  . Years of education: GED  . Highest education level: Not on file  Occupational History  . Occupation: disabled  Tobacco Use  . Smoking status: Current Every Day Smoker    Packs/day: 0.50    Years: 15.00    Pack years: 7.50    Types: Cigarettes  . Smokeless tobacco: Never Used  Vaping Use  . Vaping Use: Never used  Substance and Sexual Activity  . Alcohol use: Not Currently    Alcohol/week: 6.0 standard drinks    Types: 6 Cans  of beer per week    Comment: none since Sept 2020  . Drug use: Yes    Types: Marijuana    Comment: hx of crack, heroin; only THC since Sept 2020  . Sexual activity: Yes    Birth control/protection: Condom  Other Topics Concern  . Not on file  Social History Narrative   Pt lives with sister.   Patient drinks 2 cups of caffeine daily.   Patient is right handed.   Social Determinants of Health   Financial Resource Strain: Not on file  Food Insecurity: Not on file  Transportation Needs: Not on file  Physical Activity: Not on file  Stress: Not on file  Social Connections: Not on file  Intimate Partner Violence: Not on file     Allergies  Allergen Reactions  . Sertraline Other (See Comments)    Erectile dysfunction  . Codeine Nausea And Vomiting  . Depakote [Divalproex Sodium] Other (See Comments)    Made patient shake  . Migranal [Dihydroergotamine] Nausea And Vomiting  . Topamax [Topiramate] Other (See Comments)    Made patient angry     Outpatient Medications Prior to Visit  Medication Sig Dispense Refill  . aspirin EC 81 MG tablet Take 1 tablet (81 mg total) by mouth daily. Swallow whole. 30 tablet 11  . dexamethasone (DECADRON) 2 MG tablet Keep on hand if needed for acute headache, take 3, then take 2, then take 1 6 tablet 0  . levETIRAcetam (KEPPRA) 1000 MG tablet Take 1 tablet (1,000 mg total) by mouth 2 (two) times daily. 60 tablet 11  . albuterol (VENTOLIN HFA) 108 (90 Base) MCG/ACT inhaler INHALE 2 PUFFS BY MOUTH INTO THE LUNGS EVERY 4 HOURS AS NEEDED FOR SHORTNESS OF BREATH OR WHEEZING 8.5 g 3  . busPIRone (BUSPAR) 7.5 MG tablet TAKE 1 TABLET(7.5 MG) BY MOUTH DAILY 30 tablet 1  . folic acid (FOLVITE) 1 MG tablet Take 1 tablet (1 mg total) by mouth daily. 30 tablet 3  . ondansetron (ZOFRAN-ODT) 4 MG disintegrating tablet DISSOLVE 1 TABLET(4 MG) ON THE TONGUE EVERY 8 HOURS AS NEEDED FOR NAUSEA OR VOMITING 12 tablet 0  . rosuvastatin (CRESTOR) 10 MG tablet Take 1  tablet (10 mg total) by mouth daily. 90 tablet 3  . SYMBICORT 160-4.5 MCG/ACT inhaler INHALE 2 PUFFS INTO THE LUNGS TWICE DAILY 10.2 g 5  . thiamine 100 MG tablet Take 1 tablet (100 mg total) by mouth daily. 30 tablet 3  . traZODone (DESYREL) 100 MG tablet TAKE 2 TABLETS(200 MG) BY MOUTH AT BEDTIME 60 tablet 2   No facility-administered medications prior to visit.     Review of Systems Constitutional:    weight loss, night sweats,  Fevers, chills, fatigue, lassitude. HEENT:   No headaches,  Difficulty swallowing,  Tooth/dental problems,  Sore throat,  o sneezing, itching, ear ache, nasal congestion, post nasal drip,   CV:   chest pain,  Orthopnea, PND, swelling in lower extremities, anasarca, dizziness, palpitations  GI   heartburn, indigestion, abdominal pain, nausea, vomiting, diarrhea, change in bowel habits, loss of appetite  Resp:  shortness of breath with exertion or at rest.  No excess mucus, nproductive cough,  No non-productive cough,  No coughing up of blood.  No change in color of mucus.  No wheezing.  No chest wall deformity  Skin: no rash or lesions.  GU: no dysuria, change in color of urine, no urgency or frequency.  No flank pain.  MS:  No joint pain or swelling.  No decreased range of motion.  No back pain.  Psych:   change in mood or affect.  depression or anxiety.  No memory loss.     Objective:   Physical Exam  Vitals:   03/19/21 1343  BP: 120/76  Pulse: 60  Resp: 16  SpO2: 98%  Weight: 166 lb 6.4 oz (75.5 kg)  Height: 6' (1.829 m)    Gen: Pleasant, well-nourished, in no distress,  Anxious affect  ENT: No lesions,  mouth clear,  oropharynx clear, no postnasal drip, patient is edentulous dentures are pending  Neck: No JVD, no TMG, no carotid bruits  Lungs: No use of accessory muscles, no dullness to percussion, distant breath sounds  Cardiovascular: RRR, heart sounds normal, no murmur or gallops, no peripheral edema  Abdomen: soft and  NT, no HSM,  BS normal  Musculoskeletal: No deformities, no cyanosis or clubbing  Neuro: alert, non focal  Skin: Warm, no lesions or rashes     08/11/19 CT abdomen pelvis:   IMPRESSION: No acute CT finding to account for abdominal pain.  Hepatobiliary: Rounded hypodense lesion in segment 7, image 12 series 3. Lesion measures 16 mm in greatest diameter, and was not present or visualized on the prior noncontrast CT. There is differential enhancement pattern with less distinct border on the delayed CT images.  There is a small hypodense/hypoenhancing lesion within segment 7 of the liver which was not present on the comparison noncontrast CT. The enhancement characteristics are suspicious, and while this could represent a vascular lesion, malignancy (either primary or metastatic) cannot be excluded. Nonemergent follow-up contrast-enhanced liver protocol MRI is recommended. These results were discussed by telephone at the time of interpretation on  MRI 09/20/19:  IMPRESSION: 1. 16 mm segment 7 liver lesion has MR imaging characteristics of a benign hepatic hemangioma. 2. No other liver lesions are identified. No intra or extrahepatic biliary dilatation. 3. No acute abdominal findings and no adenopathy.  Recent CT Angio chest noted    Assessment & Plan:  I personally reviewed all images and lab data in the [facility] system as well as any outside material available during this office visit and agree with the  radiology impressions.   Hypertension Well-controlled at this time No changes in medications  CAD (coronary artery disease) Continue aspirin continue statin therapy  Abdominal aortic atherosclerosis (HCC) with 50% occlusion distal aorta Continue aspirin and statin therapy  COPD with chronic bronchitis (HCC) Smoking cessation advised Refill Symbicort and albuterol  Tobacco use Smoking continue about a half a pack a day will prescribe nicotine replacement therapy for  the patient     . Current smoking consumption amount: 1/2 pack a day  . Dicsussion on advise to quit smoking and smoking impacts: Effects on neurologic cardiovascular lung health  . Patient's willingness to  quit: Willing to try to quit  Methods to quit smoking discussed: behav mod . Medication management of smoking session drugs discussed:failed nicotine replacement  .   [name] Setting quit date no quit date set  . Follow-up arranged  4 month follow-up   Time spent counseling the patient: 5 minutes   Seizures Continue Keppra plan per neurology  Chronic insomnia Continue trazodone   Priscilla was seen today for hypertension.  Diagnoses and all orders for this visit:  MDD (major depressive disorder), recurrent severe, without psychosis ([place]) -     Ambulatory referral to Psychiatry  Primary hypertension  Coronary artery disease of native artery of native heart with stable angina pectoris ([place])  Abdominal aortic atherosclerosis ([place]) with 50% occlusion distal aorta  COPD with chronic bronchitis (HCC)  Tobacco use  Seizures ([place])  Chronic insomnia  Other orders -     albuterol (VENTOLIN HFA) 108 (90 Base) MCG/ACT inhaler; INHALE 2 PUFFS BY MOUTH INTO THE LUNGS EVERY 4 HOURS AS NEEDED FOR SHORTNESS OF BREATH OR WHEEZING -     busPIRone (BUSPAR) 7.5 MG tablet; TAKE 1 TABLET(7.5 MG) BY MOUTH DAILY -     folic acid (FOLVITE) 1 MG tablet; Take 1 tablet (1 mg total) by mouth daily. -     rosuvastatin (CRESTOR) 10 MG tablet; Take 1 tablet (10 mg total) by mouth daily. -     budesonide-formoterol (SYMBICORT) 160-4.5 MCG/ACT inhaler; Inhale 2 puffs into the lungs 2 (two) times daily. -     thiamine 100 MG tablet; Take 1 tablet (100 mg total) by mouth daily. -     traZODone (DESYREL) 100 MG tablet; TAKE 2 TABLETS(200 MG) BY MOUTH AT BEDTIME -     ALPRAZolam (XANAX) 0.25 MG tablet; Take 1 tablet (0.25 mg total) by mouth 3 (three) times daily as needed for anxiety.

## 2021-03-30 ENCOUNTER — Encounter (HOSPITAL_COMMUNITY): Payer: Self-pay

## 2021-03-30 ENCOUNTER — Other Ambulatory Visit: Payer: Self-pay

## 2021-03-30 ENCOUNTER — Ambulatory Visit (HOSPITAL_COMMUNITY)
Admission: EM | Admit: 2021-03-30 | Discharge: 2021-03-30 | Disposition: A | Discharge status: Home / Self Care | Payer: Medicare Other | Attending: Emergency Medicine | Admitting: Emergency Medicine

## 2021-03-30 DIAGNOSIS — B029 Zoster without complications: Secondary | ICD-10-CM | POA: Diagnosis not present

## 2021-03-30 MED ORDER — VALACYCLOVIR HCL 1 G PO TABS: 1000.0000 mg | ORAL_TABLET | Freq: Three times a day (TID) | ORAL | 0 refills | Status: AC

## 2021-03-30 NOTE — ED Provider Notes (Signed)
[place]    CSN: [id number] Arrival date & time: 03/30/21  1200      History   Chief Complaint Chief Complaint  Patient presents with  . Leg Pain  . Rash    HPI Spencer HAEGELE is a 60 y.o. male.   Patient here for evaluation of left thigh rash with pain and burning for the past 4 days.  Reports initially noting small rash to mid thigh and then developed significant burning, pain, and tingling.  Does report history of chickenpox as a child.  Has not taken any OTC medications or treatments.  Denies any trauma, injury, or other precipitating event.  Denies any specific alleviating or aggravating factors.  Denies any fevers, chest pain, shortness of breath, N/V/D, numbness, tingling, weakness, abdominal pain, or headaches.   ROS: As per HPI, all other pertinent ROS negative   The history is provided by the patient.  Leg Pain Rash   Past Medical History:  Diagnosis Date  . Alcohol withdrawal ([place]) 07/29/2013  . Allergy   . Anxiety   . Benzodiazepine abuse ([place]) 08/30/2019  . Bipolar disorder ([place])   . Chronic insomnia 06/06/2015  . Chronic migraine without aura, with intractable migraine, so stated, with status migrainosus 08/25/2019  . Claustrophobia   . Cocaine abuse ([place]) 05/22/2015  . Delirium tremens ([place]) 07/29/2013  . Depression   . Epilepsy ([place])    last seizure week of 06-25-2020- sev. grand mal seizures per pt   . Hemiplegic migraine with status migrainosus 06/06/2015  . Hypertension   . Migraine   . Opioid use disorder 08/30/2019  . PONV (postoperative nausea and vomiting)   . Stroke [facility])    patient denies- states had a hemiplegic migraine in ~2011  . Substance abuse [facility])     Patient Active Problem List   Diagnosis Date Noted  . Abdominal aortic atherosclerosis (HCC) with 50% occlusion distal aorta 03/05/2020  . CAD (coronary artery disease) 03/05/2020  . Osteoarthritis of left wrist 01/04/2020  . Liver hemangioma 09/21/2019  . Tobacco use  08/30/2019  . COPD with chronic bronchitis ([place]) 08/30/2019  . Chronic migraine without aura, with intractable migraine, so stated, with status migrainosus 08/25/2019  . MDD (major depressive disorder), recurrent severe, without psychosis ([place]) 11/18/2018  . Hemiplegic migraine with status migrainosus 06/06/2015  . Chronic insomnia 06/06/2015  . Seizures ([place]) 01/09/2014  . Chronic low back pain 12/28/2013  . History of stroke 12/04/2013  . Hypertension 12/04/2013    Past Surgical History:  Procedure Laterality Date  . ANKLE SURGERY     in high school  . APPENDECTOMY    . COLONOSCOPY    . KNEE ARTHROSCOPY Right   . NOSE SURGERY         Home Medications    Prior to Admission medications   Medication Sig Start Date End Date Taking? Authorizing Provider  valACYclovir (VALTREX) 1000 MG tablet Take 1 tablet (1,000 mg total) by mouth 3 (three) times daily for 7 days. 03/30/21 04/06/21 Yes Pearson Forster, NP  albuterol (VENTOLIN HFA) 108 (90 Base) MCG/ACT inhaler INHALE 2 PUFFS BY MOUTH INTO THE LUNGS EVERY 4 HOURS AS NEEDED FOR SHORTNESS OF BREATH OR WHEEZING 03/19/21   Elsie Stain, MD  ALPRAZolam Duanne Moron) 0.25 MG tablet Take 1 tablet (0.25 mg total) by mouth 3 (three) times daily as needed for anxiety. 03/19/21   Elsie Stain, MD  aspirin EC 81 MG tablet Take 1 tablet (81 mg total) by  mouth daily. Swallow whole. 07/17/20   Jerline Pain, MD  budesonide-formoterol (SYMBICORT) 160-4.5 MCG/ACT inhaler Inhale 2 puffs into the lungs 2 (two) times daily. 03/19/21   Elsie Stain, MD  busPIRone (BUSPAR) 7.5 MG tablet TAKE 1 TABLET(7.5 MG) BY MOUTH DAILY 03/19/21   Elsie Stain, MD  dexamethasone (DECADRON) 2 MG tablet Keep on hand if needed for acute headache, take 3, then take 2, then take 1 02/25/21   Suzzanne Cloud, NP  folic acid (FOLVITE) 1 MG tablet Take 1 tablet (1 mg total) by mouth daily. 03/19/21   Elsie Stain, MD  levETIRAcetam (KEPPRA) 1000 MG tablet Take 1 tablet  (1,000 mg total) by mouth 2 (two) times daily. 02/25/21   Suzzanne Cloud, NP  rosuvastatin (CRESTOR) 10 MG tablet Take 1 tablet (10 mg total) by mouth daily. 03/19/21   Elsie Stain, MD  thiamine 100 MG tablet Take 1 tablet (100 mg total) by mouth daily. 03/19/21   Elsie Stain, MD  traZODone (DESYREL) 100 MG tablet TAKE 2 TABLETS(200 MG) BY MOUTH AT BEDTIME 03/19/21   Elsie Stain, MD    Family History Family History  Problem Relation Age of Onset  . Heart disease Mother   . Stroke Mother   . Epilepsy Mother   . Heart disease Father   . Hypertension Father   . Melanoma Father   . Migraines Father   . Colon cancer Sister 64  . Colon cancer Cousin   . Colon polyps Neg Hx   . Esophageal cancer Neg Hx   . Rectal cancer Neg Hx   . Stomach cancer Neg Hx     Social History Social History   Tobacco Use  . Smoking status: Current Every Day Smoker    Packs/day: 0.50    Years: 15.00    Pack years: 7.50    Types: Cigarettes  . Smokeless tobacco: Never Used  Vaping Use  . Vaping Use: Never used  Substance Use Topics  . Alcohol use: Not Currently    Alcohol/week: 6.0 standard drinks    Types: 6 Cans of beer per week    Comment: none since Sept 2020  . Drug use: Yes    Types: Marijuana    Comment: hx of crack, heroin; only THC since Sept 2020     Allergies   Sertraline, Codeine, Depakote [divalproex sodium], Migranal [dihydroergotamine], and Topamax [topiramate]   Review of Systems Review of Systems  Skin: Positive for rash.  All other systems reviewed and are negative.    Physical Exam Triage Vital Signs ED Triage Vitals  Enc Vitals Group     BP 03/30/21 1220 122/86     Pulse Rate 03/30/21 1220 65     Resp 03/30/21 1220 17     Temp 03/30/21 1220 97.8 F (36.6 C)     Temp src --      SpO2 03/30/21 1220 96 %     Weight --      Height --      Head Circumference --      Peak Flow --      Pain Score 03/30/21 1219 8     Pain Loc --      Pain Edu? --       Excl. in [place]? --    No data found.  Updated Vital Signs BP 122/86   Pulse 65   Temp 97.8 F (36.6 C)   Resp 17   SpO2 96%  Visual Acuity Right Eye Distance:   Left Eye Distance:   Bilateral Distance:    Right Eye Near:   Left Eye Near:    Bilateral Near:     Physical Exam Vitals and nursing note reviewed.  Constitutional:      General: He is not in acute distress.    Appearance: Normal appearance. He is not ill-appearing, toxic-appearing or diaphoretic.  HENT:     Head: Normocephalic and atraumatic.  Eyes:     Conjunctiva/sclera: Conjunctivae normal.  Cardiovascular:     Rate and Rhythm: Normal rate.     Pulses: Normal pulses.  Pulmonary:     Effort: Pulmonary effort is normal.  Abdominal:     General: Abdomen is flat.  Musculoskeletal:        General: Normal range of motion.     Cervical back: Normal range of motion.  Skin:    General: Skin is warm and dry.     Findings: Rash present. Rash is vesicular (along L3 dermatome of left upper extremity ).  Neurological:     General: No focal deficit present.     Mental Status: He is alert and oriented to person, place, and time.  Psychiatric:        Mood and Affect: Mood normal.      UC Treatments / Results  Labs (all labs ordered are listed, but only abnormal results are displayed) Labs Reviewed - No data to display  EKG   Radiology No results found.  Procedures Procedures (including critical care time)  Medications Ordered in UC Medications - No data to display  Initial Impression / Assessment and Plan / UC Course  I have reviewed the triage vital signs and the nursing notes.  Pertinent labs & imaging results that were available during my care of the patient were reviewed by me and considered in my medical decision making (see chart for details).    Suspicion for red flags or concerns.  This is likely herpes zoster without complication.  Prescribed Valtrex 1000 mg 3 times a day for the next 7  days.  May apply cool compress or ice for comfort.  Discharge instructions including information on shingles.  Recommend follow-up with primary care in the next few weeks for reevaluation. Final Clinical Impressions(s) / UC Diagnoses   Final diagnoses:  Herpes zoster without complication     Discharge Instructions     Take the Valtrex three times a day for the next 7 days.    You can apply ice or a cool compress as needed for comfort.   Follow up with your primary care provider in the next few weeks to ensure that symptoms are improving, including pain.     ED Prescriptions    Medication Sig Dispense Auth. Provider   valACYclovir (VALTREX) 1000 MG tablet Take 1 tablet (1,000 mg total) by mouth 3 (three) times daily for 7 days. 21 tablet Pearson Forster, NP     PDMP not reviewed this encounter.   Pearson Forster, NP 03/30/21 1253

## 2021-03-30 NOTE — ED Triage Notes (Signed)
Pt  In with c/o rash on his left leg that he noticed 4 days ago that has spread   Pt states he thinks it is shingles because he is having a burning sensation

## 2021-03-30 NOTE — Discharge Instructions (Addendum)
Take the Valtrex three times a day for the next 7 days.    You can apply ice or a cool compress as needed for comfort.   Follow up with your primary care provider in the next few weeks to ensure that symptoms are improving, including pain.

## 2021-04-01 ENCOUNTER — Ambulatory Visit: Payer: Self-pay

## 2021-04-01 ENCOUNTER — Ambulatory Visit: Payer: Medicare Other | Attending: Critical Care Medicine | Admitting: Critical Care Medicine

## 2021-04-01 ENCOUNTER — Encounter: Payer: Self-pay | Admitting: Critical Care Medicine

## 2021-04-01 ENCOUNTER — Other Ambulatory Visit: Payer: Self-pay

## 2021-04-01 DIAGNOSIS — B028 Zoster with other complications: Secondary | ICD-10-CM | POA: Diagnosis not present

## 2021-04-01 HISTORY — DX: Zoster with other complications: B02.8

## 2021-04-01 MED ORDER — CALAMINE EX LOTN: 1.0000 "application " | TOPICAL_LOTION | CUTANEOUS | 0 refills | Status: DC | PRN

## 2021-04-01 MED ORDER — PREDNISONE 10 MG PO TABS
ORAL_TABLET | ORAL | 0 refills | Status: DC
Start: 2021-04-01 — End: 2021-06-25

## 2021-04-01 NOTE — Assessment & Plan Note (Signed)
Herpes zoster infection involving the left upper thigh into the groin  Continue Valtrex as prescribed for 7 days 1000 mg 3 times daily  Apply calamine lotion to the affected area 3 times daily obtain this over-the-counter  Begin prednisone 40 mg a day for 5 days to shorten course of infection  May use ibuprofen as needed for pain  Not indicated for shingles vaccine at this time

## 2021-04-01 NOTE — Progress Notes (Signed)
Subjective:    Patient ID: Spencer Mendoza, male    DOB: July 24, 1961, 60 y.o.   MRN: [id number] Virtual Visit via Video Note  I connected with@ on 04/01/21 at@ by a video enabled telemedicine application and verified that I am speaking with the correct person using two identifiers.   Consent:  I discussed the limitations, risks, security and privacy concerns of performing an evaluation and management service by video visit and the availability of in person appointments. I also discussed with the patient that there may be a patient responsible charge related to this service. The patient expressed understanding and agreed to proceed.  Location of patient: Patient's at home  Location of provider: I am in my office  Persons participating in the televisit with the patient.   Patient's spouse on the call  Greater than 50% of the time of this visit was spent with video although towards the end of the visit the patient's video went out and we completed the call on audio    History of Present Illness: First OV 09/16/19 This is a 60 year old white male history of episodic seizures associated with tonic-clonic events and urinary incontinence.  History also of heavy ethanol use and severe generalized anxiety disorder with claustrophobia.  Patient has significant insomnia as well.  The patient has also chronic history of migraines been on multiple medications for this in the past as well.  Patient has a history of substance use including opioids in the past as well.  Patient also uses marijuana at this time.  Patient has history of bipolar disorder and severe depression.  Patient presents today post emergency room visit to establish for primary care.  The patient had been in this clinic previously 5 years ago and wishes to come back to the clinic now to establish.  There is also prior history of stroke.  Also history of nausea vomiting and diarrhea in the past.  The patient was seen in the emergency  room on 24 September for similar complaints.  Below is the office visit with neurology on October 8  08/25/19 OV with Neurology [name] is a 60 year old right-handed white male with a history of episodic seizures associated with generalized tonic-clonic events and occasional urinary incontinence.  The patient has had significant issues with alcohol abuse, he may drink up to 12 beers a day.  He has been in the hospital in June, and on 17 September with seizures, he was in the hospital on 11 August 2019 with gastroenteritis, a CT scan of the abdomen at that time showed a low-density area in the liver that was new, he will be evaluated for possible liver cancer in the near future.  The patient has a history of bipolar disorder, he is not followed through psychiatry.  He reports a lot of problems with claustrophobia and GAD.  He is not sleeping well, he indicates that severe insomnia may worsen his seizure control.  He is on trazodone 100 mg at night but this is not helpful.  The patient continues to have headaches almost daily or every other day, he may have a severe headache at least once a week.  He has been on a multitude of medications in the past for migraine including propranolol, amitriptyline, Topamax, Depakote, Effexor, gabapentin, Seroquel, Prozac, and Aimovig.  None of these modalities have been effective in controlling his headache.  The patient returns for an evaluation.   1.  History of seizures  2.  Alcohol abuse, marijuana abuse  3.  Chronic insomnia  4.  Chronic intractable migraine  5.  Anxiety, bipolar disorder  The patient will be increased on the trazodone to 150 mg at night, he will call for any dose adjustments.  He will be set up for Botox therapy.  The patient will go up on the Keppra dosing to 750 mg twice daily.  He will follow-up here in 6 months, likely be seen sooner for Botox therapy.  Below is the emergency room visit September 24 In ED 9/24: Spencer Mendoza is a 60 y.o. male with history of bipolar, epilepsy, hypertension, migraine, CVA, substance abuse, EtOH abuse presents today for abdominal pain nausea vomiting diarrhea and rash.  Patient reports that on Sunday evening he ate shrimp that he found in his refrigerator.  He reports that he was unaware of it at that time but shrimp had been in the refrigerator for greater than 12 days.  He reports that on Sunday night he developed abdominal pain and diarrhea.  He describes a generalized abdominal pain a severe cramping sensation constant without aggravating or alleviating symptoms or radiation.  Abdominal pain has been constant for the past 4 days.  Patient reports that on Monday morning he began taking a new medication prescribed by his neurologist, Lamictal for seizures in addition to his normal Keppra.  He reports that shortly after taking the Lamictal on Monday morning his diarrhea improved but was followed by nausea and vomiting.  He describes multiple episodes of nonbloody/nonbilious emesis over the past 3 days with continued abdominal pain.  Patient reports that on Tuesday the day after he began taking Lamictal he developed a diffuse red rash to his torso that has remained constant since that time.  Reports feeling warm and cold but denies measured fever at home.  Denies headache/vision changes, neck pain, difficulty swallowing, feeling of throat closing, burning of the mouth, chest pain/shortness of breath, dysuria/hematuria, fall/injury or any additional concerns.   On initial evaluation patient is overall well-appearing and in no acute distress, not actively vomiting.  He has mild abdominal tenderness generalized without peritoneal signs.  He has a diffuse mild erythematous rash of the anterior torso.  He has no airway or mucosal involvement.  Discussed case with Dr. Roslynn Mendoza, discussed possible etiologies such as squamoid poisoning, feel this is unlikely based on history and  physical examination but will give Benadryl today.  He did begin taking Lamictal just prior to onset of his rash, review of notes from [facility] neurology show that they recommended that he stop taking Lamictal today but continue with his Keppra. - Patient seen and evaluated by Dr. Roslynn Mendoza advises that if no acute findings on CT abdomen pelvis will discharge with Benadryl and outpatient follow-up. - CBC with extensions of 11.4 Lipase within normal limits CMP with anion gap 16, suspect secondary to dehydration Urinalysis with ketones and elevated specific gravity, suspect secondary to dehydration Ethanol negative, patient does not appear to be in alcohol withdrawal  CT abdomen pelvis:  IMPRESSION: No acute CT finding to account for abdominal pain.  There is a small hypodense/hypoenhancing lesion within segment 7 of the liver which was not present on the comparison noncontrast CT. The enhancement characteristics are suspicious, and while this could represent a vascular lesion, malignancy (either primary or metastatic) cannot be excluded. Nonemergent follow-up contrast-enhanced liver protocol MRI is recommended. These results were discussed by telephone at the time of interpretation on 08/11/2019 at 5:21 pm with Dr. Nuala Alpha.  Aortic Atherosclerosis (ICD10-I70.0). -  Discussed case with in-house pharmacy who advises that patient take Benadryl 25 mg twice daily x7 days for his rash and discontinue lamotrigine, follow-up for recheck early next week. - Patient reevaluated resting comfortably and in no acute distress.  On reevaluation abdomen is soft without peritoneal signs.  Patient has been updated on results today and he states understanding of need for follow-up.  He will be given referral to gastroenterologist for further evaluation encouraged to call office tomorrow morning.  He will be discharged with Zofran for nausea and vomiting.  Patient advised to stop taking lamotrigine  immediately and to call his neurologist tomorrow morning to advise them of this.  He has Keppra at home that he will continue taking for his symptoms.  Patient does not appear to be having Stevens-Johnson syndrome at this time, he is overall well-appearing and in no acute distress.  He is advised to take Benadryl 25 mg twice daily x7 days and to follow-up with neurologist and PCP on Monday.  Note the patient does need a follow-up MRI of the liver because of the liver lesion seen which could represent a malignancy or metastatic lesion.  Contrast-enhanced liver protocol MRI was recommended.  The patient now has been sober for 3 weeks.  Is not drinking alcohol currently.  This patient had been previously in mental health programs for drug substance use and depression.  He has been on multiple depression medications in the past.  He has been on trazodone for sleep in the past.  He also has been on Prozac.  He also used IV heroin in the past and also cocaine and Dilaudid in the past.  He does obtain off the street Xanax and uses this as needed at home.   10/03/2019 This patient is seen today in return follow-up from the mid October visit as documented above.  The patient currently is not drinking alcohol at this time.  He still suffers from significant depression and anxiety.  We tried sertraline on him 50 mg daily but he experienced erectile dysfunction and had to stop this medication.  He is continue the thiamine 606 mg daily folic acid 1 mg daily.  The patient is currently not on treatment for hypertension as his blood pressures been well controlled.  Note this patient underwent an MRI of the liver to look for any abnormalities in the liver when an abdominal CT scan suggested potential liver cancer.  The MRI in fact showed a benign hepatic hemangioma and nothing else.  I went over with the patient this result previously on the phone today we went over this visit the results as well face-to-face.  The  patient states he still has significant anxiety and depression and has not been able to achieve a psychiatry appointment as of yet.  He did meet with the licensed Education officer, museum. The patient is still smoking a pack a day of cigarettes.  He has not attempted to use nicotine to replace cigarettes as of yet.  He is maintaining his inhalers for COPD. Patient does complain of significant ongoing insomnia though he is on the trazodone 200 mg a day he gets him to sleep however he awakens in the middle the night with some choking sensations. The patient has completely stopped drinking alcohol  11/09/2019 This is a follow-up telephone visit for this patient who was last seen in November.  The patient has recently received Botox injections for his migraine syndrome and this is markedly improved his symptoms.  He maintains his seizure  medications as well per neurology.  He is still smoking about half a pack a day of cigarettes.  He complains of increased cough increased sinus pressure increased sneezing low-grade fever dizziness.  He was Covid + November 22 and at that time had more gastrointestinal symptoms.  He had a girlfriend was positive as well. He has yet to achieve a psychiatry visit for his major depressive disorder.  His insurance did not approve the referrals we made previously.  Also the patient continues with his insomnia and hypersomnolence during the day and is yet to achieve a sleep study due to barriers from [employer] insurance company as well.  He notes thick yellow mucus coming out of the nose at this time. The patient is using the Symbicort inhaler as prescribed   11/30/2019 The patient is seen today and is in very good spirits and in much improved mood and affect.  He states the trazodone has helped him at night during sleep.  He has [facility] a [facility] health provider [facility] at this time and is awaiting further recommendations [facility] on [facility] health therapy.  He has a follow-up visit with a  therapist coming up soon.  The patient's migraine headaches are significantly improved and stable at this time.  In addition the patient has been abstinent from alcohol for over 9 weeks.  Patient is still smoking small amounts and is interested in further smoking cessation assistance.  He does have good breathing at this time and is on the Symbicort.  There have been no additional seizures and he maintains on Keppra and as well the patient is on folic acid and thiamine while off alcohol   There has not been any additional abdominal pain since the last visit  03/20/2020 Patient seen in return follow-up with history of hypertension and now diagnosed calcium deposits in the coronaries as well as aortic atherosclerosis with 50% occlusion at the distal aorta.  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  Patient also had presyncopal episodes as well.  He continues have chronic insomnia however trazodone has been somewhat of help.  He is yet to have a sleep study as we have requested previously.  Patient is still smoking about a half a pack a day of cigarettes and we discussed the need to reduce smoking previously.  Patient states his mood disorder has stabilized and does have follow-up with his mental health provider. The patient did go to the emergency room when he had increased chest discomfort with negative troponins negative EKG however has since seen cardiology and they are scheduling him for coronary CT scan along with a long-term rhythm monitor.  The patient needs to be on statins but with multiple other issues going on at this time we have held off on this also he needs a Covid vaccine but again elected to hold off on this during his current symptom complex  07/12/2020 This patient was seen today in return follow-up by way of a telephone visit.  This patient's  been noting increased cough shortness of breath and lightheadedness with temp up to 101 over the past week.  He went and had a Covid test which was negative.  He has had also some chest pain in the front of his chest.  He is not using alcohol or cocaine he is reduced his marijuana intake because it  irritates his breathing.  He is using his inhalers.  He smoking about 1/2 pack a day of cigarettes.  The cough is productive of thick yellow mucus with wheezing associated  From a mental health perspective he has a follow-up appointment within the next month and is wishing to be bridged with Xanax until he gets in with the mental health provider.  He has been on Xanax in the past and this helps his panic attacks he takes 0.5 mg 1-2 times daily and only uses it as needed  The patient has a history of migraines but cannot afford the Botox is not taking this medication at this time  The patient's not had any recent seizures.  The patient had tried the Wellbutrin but did not help him and he is off this medication at this time hydroxyzine also was no help with his anxiety  He does maintain the Symbicort inhaler maintains Keppra Rova statin trazodone he is in need of refills on these medications Abdominal aortic atherosclerosis ([place]) with 50% occlusion distal aorta Patient has abdominal aortic atherosclerosis with 50% occlusion of the aorta not a good candidate for statins due to muscular side effects of other medications  Plan will be to refer patient to vascular surgery for further evaluation   11/9 Patient seen return follow-up underlying COPD chronic anxiety depression now has access to health insurance.  His tobacco use is down to less than half a pack a day.  The patient states that his cough is slightly increased productive of thick yellow mucus.  He has increased sinus congestion.  He is on the Xanax and he is stretching this out it does help to some degree.  He has an appointment at [facility] soon.  He  has been free of alcohol now for 14 months.  He is trying to cut back on the tobacco use.  He will get a flu vaccine but wants to learn more about the Covid vaccine first as he has been reluctant to obtain the Covid vaccine  03/19/2021 This is a 60 year old male seen today in return follow-up for severe anxiety panic disorder hypertension atherosclerotic disease with coronary disease and aortic  atherosclerosis and COPD chronic bronchitis major depression ongoing tobacco use  Note patient does not drink alcohol but he does occasionally use marijuana.  He ran out of his Xanax 25 days ago and has been having difficult time he does see a mental health provider who does not prescribe Xanax because he uses marijuana.  He wishes a second opinion.  Patient states that shortness of breath is at baseline.  On arrival blood pressure is 120/76.  Note he only received 1 vaccine for COVID Moderna states he does not wish to pursue this vaccine series any further and was made aware of COVID precautions.  Also wishes to defer his tetanus vaccine at this visit.  No other complaints are present at this time.  04/01/2021 This is a work in visit.  The patient's developed a weeks worth of left thigh rashes on the inner aspect of the thigh and goes up into the groin area.  He went to urgent care over the weekend and was told he had herpes zoster infection given a course of Valtrex for which she is currently taking therapy.  He wanted to check in to see if anything else could be done and had questions about potentially getting a shingles vaccine     Past Medical History:  Diagnosis Date  . Alcohol withdrawal ([place]) 07/29/2013  .  Allergy   . Anxiety   . Benzodiazepine abuse ([place]) 08/30/2019  . Bipolar disorder ([place])   . Chronic insomnia 06/06/2015  . Chronic migraine without aura, with intractable migraine, so stated, with status migrainosus 08/25/2019  . Claustrophobia   . Cocaine abuse ([place]) 05/22/2015  . Delirium  tremens ([place]) 07/29/2013  . Depression   . Epilepsy ([place])    last seizure week of 06-25-2020- sev. grand mal seizures per pt   . Hemiplegic migraine with status migrainosus 06/06/2015  . Hypertension   . Migraine   . Opioid use disorder 08/30/2019  . PONV (postoperative nausea and vomiting)   . Stroke [facility])    patient denies- states had a hemiplegic migraine in ~2011  . Substance abuse ([place])      Family History  Problem Relation Age of Onset  . Heart disease Mother   . Stroke Mother   . Epilepsy Mother   . Heart disease Father   . Hypertension Father   . Melanoma Father   . Migraines Father   . Colon cancer Sister 73  . Colon cancer Cousin   . Colon polyps Neg Hx   . Esophageal cancer Neg Hx   . Rectal cancer Neg Hx   . Stomach cancer Neg Hx      Social History   Socioeconomic History  . Marital status: Legally Separated    Spouse name: Not on file  . Number of children: 0  . Years of education: GED  . Highest education level: Not on file  Occupational History  . Occupation: disabled  Tobacco Use  . Smoking status: Current Every Day Smoker    Packs/day: 0.50    Years: 15.00    Pack years: 7.50    Types: Cigarettes  . Smokeless tobacco: Never Used  Vaping Use  . Vaping Use: Never used  Substance and Sexual Activity  . Alcohol use: Not Currently    Alcohol/week: 6.0 standard drinks    Types: 6 Cans of beer per week    Comment: none since Sept 2020  . Drug use: Yes    Types: Marijuana    Comment: hx of crack, heroin; only THC since Sept 2020  . Sexual activity: Yes    Birth control/protection: Condom  Other Topics Concern  . Not on file  Social History Narrative   Pt lives with sister.   Patient drinks 2 cups of caffeine daily.   Patient is right handed.   Social Determinants of Health   Financial Resource Strain: Not on file  Food Insecurity: Not on file  Transportation Needs: Not on file  Physical Activity: Not on file  Stress: Not on file  Social  Connections: Not on file  Intimate Partner Violence: Not on file     Allergies  Allergen Reactions  . Sertraline Other (See Comments)    Erectile dysfunction  . Codeine Nausea And Vomiting  . Depakote [Divalproex Sodium] Other (See Comments)    Made patient shake  . Migranal [Dihydroergotamine] Nausea And Vomiting  . Topamax [Topiramate] Other (See Comments)    Made patient angry     Outpatient Medications Prior to Visit  Medication Sig Dispense Refill  . albuterol (VENTOLIN HFA) 108 (90 Base) MCG/ACT inhaler INHALE 2 PUFFS BY MOUTH INTO THE LUNGS EVERY 4 HOURS AS NEEDED FOR SHORTNESS OF BREATH OR WHEEZING 8.5 g 3  . ALPRAZolam (XANAX) 0.25 MG tablet Take 1 tablet (0.25 mg total) by mouth 3 (three) times daily as needed for anxiety.  90 tablet 0  . aspirin EC 81 MG tablet Take 1 tablet (81 mg total) by mouth daily. Swallow whole. 30 tablet 11  . budesonide-formoterol (SYMBICORT) 160-4.5 MCG/ACT inhaler Inhale 2 puffs into the lungs 2 (two) times daily. 10.2 g 5  . busPIRone (BUSPAR) 7.5 MG tablet TAKE 1 TABLET(7.5 MG) BY MOUTH DAILY 30 tablet 1  . dexamethasone (DECADRON) 2 MG tablet Keep on hand if needed for acute headache, take 3, then take 2, then take 1 6 tablet 0  . folic acid (FOLVITE) 1 MG tablet Take 1 tablet (1 mg total) by mouth daily. 30 tablet 3  . levETIRAcetam (KEPPRA) 1000 MG tablet Take 1 tablet (1,000 mg total) by mouth 2 (two) times daily. 60 tablet 11  . rosuvastatin (CRESTOR) 10 MG tablet Take 1 tablet (10 mg total) by mouth daily. 90 tablet 3  . thiamine 100 MG tablet Take 1 tablet (100 mg total) by mouth daily. 30 tablet 3  . traZODone (DESYREL) 100 MG tablet TAKE 2 TABLETS(200 MG) BY MOUTH AT BEDTIME 60 tablet 2  . valACYclovir (VALTREX) 1000 MG tablet Take 1 tablet (1,000 mg total) by mouth 3 (three) times daily for 7 days. 21 tablet 0   No facility-administered medications prior to visit.     Review of Systems Constitutional:    weight loss, night sweats,   Fevers, chills, fatigue, lassitude. HEENT:   No headaches,  Difficulty swallowing,  Tooth/dental problems,  Sore throat,               o sneezing, itching, ear ache, nasal congestion, post nasal drip,   CV:   chest pain,  Orthopnea, PND, swelling in lower extremities, anasarca, dizziness, palpitations  GI   heartburn, indigestion, abdominal pain, nausea, vomiting, diarrhea, change in bowel habits, loss of appetite  Resp:  shortness of breath with exertion or at rest.  No excess mucus, nproductive cough,  No non-productive cough,  No coughing up of blood.  No change in color of mucus.  No wheezing.  No chest wall deformity  Skin:  rash or lesions.  GU: no dysuria, change in color of urine, no urgency or frequency.  No flank pain.  MS:  No joint pain or swelling.  No decreased range of motion.  No back pain.  Psych:   change in mood or affect.  depression or anxiety.  No memory loss.     Objective:   Physical Exam  There were no vitals filed for this visit. This is a video visit I was able to look at the patient's left leg and the thigh area and it is classic herpes zoster distribution    08/11/19 CT abdomen pelvis:   IMPRESSION: No acute CT finding to account for abdominal pain.  Hepatobiliary: Rounded hypodense lesion in segment 7, image 12 series 3. Lesion measures 16 mm in greatest diameter, and was not present or visualized on the prior noncontrast CT. There is differential enhancement pattern with less distinct border on the delayed CT images.  There is a small hypodense/hypoenhancing lesion within segment 7 of the liver which was not present on the comparison noncontrast CT. The enhancement characteristics are suspicious, and while this could represent a vascular lesion, malignancy (either primary or metastatic) cannot be excluded. Nonemergent follow-up contrast-enhanced liver protocol MRI is recommended. These results were discussed by telephone at the time of  interpretation on  MRI 09/20/19:  IMPRESSION: 1. 16 mm segment 7 liver lesion has MR imaging characteristics of a  benign hepatic hemangioma. 2. No other liver lesions are identified. No intra or extrahepatic biliary dilatation. 3. No acute abdominal findings and no adenopathy.  Recent CT Angio chest noted    Assessment & Plan:  I personally reviewed all images and lab data in the [facility] system as well as any outside material available during this office visit and agree with the  radiology impressions.   Exanthem due to herpes zoster Herpes zoster infection involving the left upper thigh into the groin  Continue Valtrex as prescribed for 7 days 1000 mg 3 times daily  Apply calamine lotion to the affected area 3 times daily obtain this over-the-counter  Begin prednisone 40 mg a day for 5 days to shorten course of infection  May use ibuprofen as needed for pain  Not indicated for shingles vaccine at this time   Bertram was seen today for herpes zoster.  Diagnoses and all orders for this visit:  Exanthem due to herpes zoster  Other orders -     predniSONE (DELTASONE) 10 MG tablet; Take 4 tablets daily for 5 days then stop -     calamine lotion; Apply 1 application topically as needed for itching. Apply three times daily as needed to left thigh rash   I spent 15 minutes going over the patient diagnosis and prescribing supportive care for his herpes zoster infection on the leg Follow Up Instructions: Patient will follow-up in the next visit call sooner if necessary   I discussed the assessment and treatment plan with the patient. The patient was provided an opportunity to ask questions and all were answered. The patient agreed with the plan and demonstrated an understanding of the instructions.   The patient was advised to call back or seek an in-person evaluation if the symptoms worsen or if the condition fails to improve as anticipated.  I provided 15 minutes of  non-face-to-face time during this encounter  including  median intraservice time , review of notes, labs, imaging, medications  and explaining diagnosis and management to the patient .    Asencion Noble, MD

## 2021-04-01 NOTE — Telephone Encounter (Signed)
Went to [facility] on Saturday. Dx with  Shingles. Valacyclovir prescribed at [facility] . Takes 3 times a day for 7 day. Blisters and red clusters left side of  crotch down to left knee. Red and swollen and rash.  Blisters have a "white top" to them and pt stated his surrounding skin is red. Pt having moderate itching and severe pain. Pt taking 600 Ibuprofen without effect.  Video visit scheduled and spoke to [place] to check if ok since booked soon to appointment.  Care advice given and pt verbalized understanding.    Reason for Disposition . [1] Looks infected (spreading redness, pus) AND [2] no fever  Answer Assessment - Initial Assessment Questions 1. APPEARANCE of RASH: "Describe the rash."      Red and blisters.head of eraser and blisters and leaks 2. LOCATION: "Where is the rash located?"      Left leg inner thigh down to knee 3. ONSET: "When did the rash start?"      Last week 4. ITCHING: "Does the rash itch?" If Yes, ask: "How bad is the itch?"  (Scale 1-10; or mild, moderate, severe)     moderate 5. PAIN: "Does the rash hurt?" If Yes, ask: "How bad is the pain?"  (Scale 0-10; or none, mild, moderate, severe)    - NONE (0): no pain    - MILD (1-3): doesn't interfere with normal activities     - MODERATE (4-7): interferes with normal activities or awakens from sleep     - SEVERE (8-10): excruciating pain, unable to do any normal activities     Severe- cant sleep  6. OTHER SYMPTOMS: "Do you have any other symptoms?" (e.g., fever)     Weakness, feel subjective fever  Protocols used: SHINGLES (ZOSTER)-A-AH

## 2021-04-22 ENCOUNTER — Ambulatory Visit (HOSPITAL_COMMUNITY): Payer: Medicare Other

## 2021-04-22 ENCOUNTER — Other Ambulatory Visit: Payer: Self-pay

## 2021-05-17 ENCOUNTER — Other Ambulatory Visit: Payer: Self-pay | Admitting: Critical Care Medicine

## 2021-05-21 ENCOUNTER — Ambulatory Visit (HOSPITAL_COMMUNITY)
Admission: EM | Admit: 2021-05-21 | Discharge: 2021-05-21 | Disposition: A | Discharge status: Home / Self Care | Payer: Medicare Other | Attending: Internal Medicine | Admitting: Internal Medicine

## 2021-05-21 ENCOUNTER — Encounter (HOSPITAL_COMMUNITY): Payer: Self-pay

## 2021-05-21 ENCOUNTER — Other Ambulatory Visit: Payer: Self-pay

## 2021-05-21 ENCOUNTER — Emergency Department (HOSPITAL_COMMUNITY): Payer: Medicare Other

## 2021-05-21 ENCOUNTER — Emergency Department (HOSPITAL_COMMUNITY)
Admission: EM | Admit: 2021-05-21 | Discharge: 2021-05-21 | Disposition: A | Discharge status: Home / Self Care | Payer: Medicare Other | Attending: Emergency Medicine | Admitting: Emergency Medicine

## 2021-05-21 ENCOUNTER — Encounter (HOSPITAL_COMMUNITY): Payer: Self-pay | Admitting: *Deleted

## 2021-05-21 DIAGNOSIS — Z87898 Personal history of other specified conditions: Secondary | ICD-10-CM

## 2021-05-21 DIAGNOSIS — K920 Hematemesis: Secondary | ICD-10-CM

## 2021-05-21 DIAGNOSIS — I10 Essential (primary) hypertension: Secondary | ICD-10-CM | POA: Diagnosis not present

## 2021-05-21 DIAGNOSIS — Z7982 Long term (current) use of aspirin: Secondary | ICD-10-CM | POA: Diagnosis not present

## 2021-05-21 DIAGNOSIS — F17210 Nicotine dependence, cigarettes, uncomplicated: Secondary | ICD-10-CM | POA: Diagnosis not present

## 2021-05-21 DIAGNOSIS — R1110 Vomiting, unspecified: Secondary | ICD-10-CM | POA: Diagnosis not present

## 2021-05-21 DIAGNOSIS — E876 Hypokalemia: Secondary | ICD-10-CM | POA: Insufficient documentation

## 2021-05-21 DIAGNOSIS — Z7951 Long term (current) use of inhaled steroids: Secondary | ICD-10-CM | POA: Diagnosis not present

## 2021-05-21 DIAGNOSIS — R109 Unspecified abdominal pain: Secondary | ICD-10-CM | POA: Diagnosis not present

## 2021-05-21 DIAGNOSIS — I2510 Atherosclerotic heart disease of native coronary artery without angina pectoris: Secondary | ICD-10-CM | POA: Diagnosis not present

## 2021-05-21 DIAGNOSIS — R059 Cough, unspecified: Secondary | ICD-10-CM | POA: Diagnosis not present

## 2021-05-21 DIAGNOSIS — R112 Nausea with vomiting, unspecified: Secondary | ICD-10-CM

## 2021-05-21 DIAGNOSIS — K921 Melena: Secondary | ICD-10-CM

## 2021-05-21 DIAGNOSIS — R1010 Upper abdominal pain, unspecified: Secondary | ICD-10-CM | POA: Diagnosis not present

## 2021-05-21 DIAGNOSIS — Z79899 Other long term (current) drug therapy: Secondary | ICD-10-CM | POA: Diagnosis not present

## 2021-05-21 DIAGNOSIS — R079 Chest pain, unspecified: Secondary | ICD-10-CM | POA: Diagnosis not present

## 2021-05-21 DIAGNOSIS — R0789 Other chest pain: Secondary | ICD-10-CM | POA: Diagnosis not present

## 2021-05-21 DIAGNOSIS — R1013 Epigastric pain: Secondary | ICD-10-CM

## 2021-05-21 DIAGNOSIS — Z20822 Contact with and (suspected) exposure to covid-19: Secondary | ICD-10-CM | POA: Diagnosis not present

## 2021-05-21 LAB — CBC WITH DIFFERENTIAL/PLATELET
Abs Immature Granulocytes: 0.04 10*3/uL (ref 0.00–0.07)
Basophils Absolute: 0.1 10*3/uL (ref 0.0–0.1)
Basophils Relative: 1 %
Eosinophils Absolute: 0.1 10*3/uL (ref 0.0–0.5)
Eosinophils Relative: 1 %
HCT: 46.5 % (ref 39.0–52.0)
Hemoglobin: 17 g/dL (ref 13.0–17.0)
Immature Granulocytes: 0 %
Lymphocytes Relative: 28 %
Lymphs Abs: 3.1 10*3/uL (ref 0.7–4.0)
MCH: 32 pg (ref 26.0–34.0)
MCHC: 36.6 g/dL — ABNORMAL HIGH (ref 30.0–36.0)
MCV: 87.4 fL (ref 80.0–100.0)
Monocytes Absolute: 1.2 10*3/uL — ABNORMAL HIGH (ref 0.1–1.0)
Monocytes Relative: 11 %
Neutro Abs: 6.7 10*3/uL (ref 1.7–7.7)
Neutrophils Relative %: 59 %
Platelets: 239 10*3/uL (ref 150–400)
RBC: 5.32 MIL/uL (ref 4.22–5.81)
RDW: 13.8 % (ref 11.5–15.5)
WBC: 11.2 10*3/uL — ABNORMAL HIGH (ref 4.0–10.5)
nRBC: 0 % (ref 0.0–0.2)

## 2021-05-21 LAB — LIPASE, BLOOD: Lipase: 28 U/L (ref 11–51)

## 2021-05-21 LAB — URINALYSIS, ROUTINE W REFLEX MICROSCOPIC
Bilirubin Urine: NEGATIVE
Glucose, UA: NEGATIVE mg/dL
Hgb urine dipstick: NEGATIVE
Ketones, ur: 5 mg/dL — AB
Leukocytes,Ua: NEGATIVE
Nitrite: NEGATIVE
Protein, ur: NEGATIVE mg/dL
Specific Gravity, Urine: 1.006 (ref 1.005–1.030)
pH: 9 — ABNORMAL HIGH (ref 5.0–8.0)

## 2021-05-21 LAB — I-STAT CHEM 8, ED
BUN: 7 mg/dL — ABNORMAL LOW (ref 8–23)
Calcium, Ion: 1.15 mmol/L (ref 1.15–1.40)
Chloride: 100 mmol/L (ref 98–111)
Creatinine, Ser: 1 mg/dL (ref 0.61–1.24)
Glucose, Bld: 109 mg/dL — ABNORMAL HIGH (ref 70–99)
HCT: 48 % (ref 39.0–52.0)
Hemoglobin: 16.3 g/dL (ref 13.0–17.0)
Potassium: 3.3 mmol/L — ABNORMAL LOW (ref 3.5–5.1)
Sodium: 137 mmol/L (ref 135–145)
TCO2: 25 mmol/L (ref 22–32)

## 2021-05-21 LAB — COMPREHENSIVE METABOLIC PANEL
ALT: 26 U/L (ref 0–44)
AST: 32 U/L (ref 15–41)
Albumin: 4.9 g/dL (ref 3.5–5.0)
Alkaline Phosphatase: 72 U/L (ref 38–126)
Anion gap: 14 (ref 5–15)
BUN: 8 mg/dL (ref 8–23)
CO2: 25 mmol/L (ref 22–32)
Calcium: 10 mg/dL (ref 8.9–10.3)
Chloride: 98 mmol/L (ref 98–111)
Creatinine, Ser: 1.12 mg/dL (ref 0.61–1.24)
GFR, Estimated: >60 mL/min (ref >60)
Glucose, Bld: 109 mg/dL — ABNORMAL HIGH (ref 70–99)
Potassium: 3.2 mmol/L — ABNORMAL LOW (ref 3.5–5.1)
Sodium: 137 mmol/L (ref 135–145)
Total Bilirubin: 1.7 mg/dL — ABNORMAL HIGH (ref 0.3–1.2)
Total Protein: 8 g/dL (ref 6.5–8.1)

## 2021-05-21 LAB — PROTIME-INR
INR: 1 (ref 0.8–1.2)
Prothrombin Time: 12.7 seconds (ref 11.4–15.2)

## 2021-05-21 LAB — RESP PANEL BY RT-PCR (FLU A&B, COVID) ARPGX2
Influenza A by PCR: NEGATIVE
Influenza B by PCR: NEGATIVE
SARS Coronavirus 2 by RT PCR: NEGATIVE

## 2021-05-21 LAB — RAPID URINE DRUG SCREEN, HOSP PERFORMED
Amphetamines: NOT DETECTED
Barbiturates: NOT DETECTED
Benzodiazepines: NOT DETECTED
Cocaine: NOT DETECTED
Opiates: NOT DETECTED
Tetrahydrocannabinol: POSITIVE — AB

## 2021-05-21 LAB — TROPONIN I (HIGH SENSITIVITY)
Troponin I (High Sensitivity): 6 ng/L (ref <18)
Troponin I (High Sensitivity): 6 ng/L (ref <18)

## 2021-05-21 LAB — LACTIC ACID, PLASMA
Lactic Acid, Venous: 2.5 mmol/L (ref 0.5–1.9)
Lactic Acid, Venous: 2.5 mmol/L (ref 0.5–1.9)

## 2021-05-21 MED ORDER — POTASSIUM CHLORIDE CRYS ER 20 MEQ PO TBCR
40.0000 meq | EXTENDED_RELEASE_TABLET | Freq: Once | ORAL | Status: AC
  Administered 2021-05-21: 40 meq via ORAL
  Filled 2021-05-21: qty 2

## 2021-05-21 MED ORDER — FENTANYL CITRATE (PF) 100 MCG/2ML IJ SOLN
100.0000 ug | Freq: Once | INTRAMUSCULAR | Status: AC
  Administered 2021-05-21: 100 ug via INTRAVENOUS
  Filled 2021-05-21: qty 2

## 2021-05-21 MED ORDER — METOCLOPRAMIDE HCL 5 MG/ML IJ SOLN
10.0000 mg | Freq: Once | INTRAMUSCULAR | Status: AC
  Administered 2021-05-21: 10 mg via INTRAVENOUS
  Filled 2021-05-21: qty 2

## 2021-05-21 MED ORDER — ONDANSETRON HCL 4 MG/2ML IJ SOLN
4.0000 mg | Freq: Once | INTRAMUSCULAR | Status: AC
  Administered 2021-05-21: 4 mg via INTRAVENOUS
  Filled 2021-05-21: qty 2

## 2021-05-21 MED ORDER — LACTATED RINGERS IV BOLUS
1000.0000 mL | Freq: Once | INTRAVENOUS | Status: AC
  Administered 2021-05-21: 1000 mL via INTRAVENOUS

## 2021-05-21 MED ORDER — IOHEXOL 350 MG/ML SOLN
100.0000 mL | Freq: Once | INTRAVENOUS | Status: AC | PRN
  Administered 2021-05-21: 100 mL via INTRAVENOUS

## 2021-05-21 MED ORDER — FENTANYL CITRATE (PF) 100 MCG/2ML IJ SOLN
100.0000 ug | Freq: Once | INTRAMUSCULAR | Status: AC
Start: 2021-05-21 — End: 2021-05-21
  Administered 2021-05-21: 100 ug via INTRAVENOUS
  Filled 2021-05-21: qty 2

## 2021-05-21 MED ORDER — ONDANSETRON 4 MG PO TBDP: ORAL_TABLET | ORAL | 0 refills | Status: DC

## 2021-05-21 MED ORDER — PANTOPRAZOLE SODIUM 20 MG PO TBEC: 20.0000 mg | DELAYED_RELEASE_TABLET | Freq: Every day | ORAL | 0 refills | Status: DC

## 2021-05-21 MED ORDER — IOHEXOL 300 MG/ML  SOLN: 100.0000 mL | Freq: Once | INTRAMUSCULAR | Status: DC | PRN

## 2021-05-21 MED ORDER — OXYCODONE-ACETAMINOPHEN 5-325 MG PO TABS: 1.0000 | ORAL_TABLET | Freq: Four times a day (QID) | ORAL | 0 refills | Status: DC | PRN

## 2021-05-21 NOTE — Discharge Instructions (Addendum)
Follow-up with [facility] gastroenterology.  Drink plenty of fluids return sooner if problems

## 2021-05-21 NOTE — ED Notes (Addendum)
Patient is being discharged from the Urgent Care and sent to the Emergency Department via POV. Per Marin Roberts, PA, patient is in need of higher level of care due to Provider concern for perforated peptic ulcer or gastritis, pancreatitis or hepatitis. Patient is aware and verbalizes understanding of plan of care.  Vitals:   05/21/21 0945  BP: 103/76  Pulse: 84  Resp: 18  Temp: 98.7 F (37.1 C)  SpO2: 97%

## 2021-05-21 NOTE — ED Provider Notes (Addendum)
[place]    CSN: [id number] Arrival date & time: 05/21/21  9326      History   Chief Complaint Chief Complaint  Patient presents with   Emesis   oliguria   Chest Pain   Back Pain    HPI Spencer Mendoza is a 60 y.o. male presenting with intractable nausea with coffee ground emesis and melena, associated with severe epigastric pain. Medical history alcohol use disorder, alcohol withdrawal, delirium tremens, cocaine abuse, benzodiazepine abuse, opioid use disorder, epilepsy, stroke, chronic migraine, bipolar disorder, hypertension.  He endorses 3 days of intractable nausea with vomiting, this is progressed to coffee-ground emesis over the last day.  He is unable to quantify how frequently he vomits, but states this is many times throughout the day.  Also with constipation and melena.  States last bowel movement was 1 day ago and it was hard and black.  Severe epigastric pain radiating to the back. Burning pain also radiating up through sternum. States he has urinated 1 time in the last 3 days.  Does endorse subjective chills and cough.  This patient has a history of alcohol use disorder, he denies recent or current alcohol use.  Denies sick contacts.  HPI  Past Medical History:  Diagnosis Date   Alcohol withdrawal ([place]) 07/29/2013   Allergy    Anxiety    Benzodiazepine abuse ([place]) 08/30/2019   Bipolar disorder ([place])    Chronic insomnia 06/06/2015   Chronic migraine without aura, with intractable migraine, so stated, with status migrainosus 08/25/2019   Claustrophobia    Cocaine abuse ([place]) 05/22/2015   Delirium tremens ([place]) 07/29/2013   Depression    Epilepsy ([place])    last seizure week of 06-25-2020- sev. grand mal seizures per pt    Hemiplegic migraine with status migrainosus 06/06/2015   Hypertension    Migraine    Opioid use disorder 08/30/2019   PONV (postoperative nausea and vomiting)    Stroke [facility])    patient denies- states had a hemiplegic migraine in ~2011    Substance abuse [facility])     Patient Active Problem List   Diagnosis Date Noted   Exanthem due to herpes zoster 04/01/2021   Abdominal aortic atherosclerosis ([place]) with 50% occlusion distal aorta 03/05/2020   CAD (coronary artery disease) 03/05/2020   Osteoarthritis of left wrist 01/04/2020   Liver hemangioma 09/21/2019   Tobacco use 08/30/2019   COPD with chronic bronchitis ([place]) 08/30/2019   Chronic migraine without aura, with intractable migraine, so stated, with status migrainosus 08/25/2019   MDD (major depressive disorder), recurrent severe, without psychosis ([place]) 11/18/2018   Hemiplegic migraine with status migrainosus 06/06/2015   Chronic insomnia 06/06/2015   Seizures ([place]) 01/09/2014   Chronic low back pain 12/28/2013   History of stroke 12/04/2013   Hypertension 12/04/2013    Past Surgical History:  Procedure Laterality Date   ANKLE SURGERY     in high school   APPENDECTOMY     COLONOSCOPY     KNEE ARTHROSCOPY Right    NOSE SURGERY         Home Medications    Prior to Admission medications   Medication Sig Start Date End Date Taking? Authorizing Provider  albuterol (VENTOLIN HFA) 108 (90 Base) MCG/ACT inhaler INHALE 2 PUFFS BY MOUTH INTO THE LUNGS EVERY 4 HOURS AS NEEDED FOR SHORTNESS OF BREATH OR WHEEZING 03/19/21  Yes Elsie Stain, MD  ALPRAZolam Duanne Moron) 0.25 MG tablet Take 1 tablet (0.25 mg total) by mouth  3 (three) times daily as needed for anxiety. 03/19/21  Yes Elsie Stain, MD  aspirin EC 81 MG tablet Take 1 tablet (81 mg total) by mouth daily. Swallow whole. 07/17/20  Yes Jerline Pain, MD  budesonide-formoterol (SYMBICORT) 160-4.5 MCG/ACT inhaler Inhale 2 puffs into the lungs 2 (two) times daily. 03/19/21  Yes Elsie Stain, MD  busPIRone (BUSPAR) 7.5 MG tablet TAKE 1 TABLET(7.5 MG) BY MOUTH DAILY 05/17/21  Yes Elsie Stain, MD  calamine lotion Apply 1 application topically as needed for itching. Apply three times daily as needed to left thigh  rash 04/01/21  Yes Elsie Stain, MD  dexamethasone (DECADRON) 2 MG tablet Keep on hand if needed for acute headache, take 3, then take 2, then take 1 02/25/21  Yes Suzzanne Cloud, NP  folic acid (FOLVITE) 1 MG tablet Take 1 tablet (1 mg total) by mouth daily. 03/19/21  Yes Elsie Stain, MD  levETIRAcetam (KEPPRA) 1000 MG tablet Take 1 tablet (1,000 mg total) by mouth 2 (two) times daily. 02/25/21  Yes Suzzanne Cloud, NP  rosuvastatin (CRESTOR) 10 MG tablet Take 1 tablet (10 mg total) by mouth daily. 03/19/21  Yes Elsie Stain, MD  thiamine 100 MG tablet Take 1 tablet (100 mg total) by mouth daily. 03/19/21  Yes Elsie Stain, MD  traZODone (DESYREL) 100 MG tablet TAKE 2 TABLETS(200 MG) BY MOUTH AT BEDTIME 03/19/21  Yes Elsie Stain, MD  predniSONE (DELTASONE) 10 MG tablet Take 4 tablets daily for 5 days then stop 04/01/21   Elsie Stain, MD    Family History Family History  Problem Relation Age of Onset   Heart disease Mother    Stroke Mother    Epilepsy Mother    Heart failure Mother    Cancer Father    Heart disease Father    Hypertension Father    Melanoma Father    Migraines Father    Colon cancer Sister 20   Colon cancer Cousin    Cancer Cousin    Colon polyps Neg Hx    Esophageal cancer Neg Hx    Rectal cancer Neg Hx    Stomach cancer Neg Hx     Social History Social History   Tobacco Use   Smoking status: Every Day    Years: 15.00    Pack years: 0.00    Types: Cigarettes   Smokeless tobacco: Never   Tobacco comments:    Reports 2 cigarettes daily  Vaping Use   Vaping Use: Never used  Substance Use Topics   Alcohol use: Not Currently    Alcohol/week: 6.0 standard drinks    Types: 6 Cans of beer per week    Comment: none since Sept 2020   Drug use: Not Currently    Types: Marijuana    Comment: hx of crack, heroin; only THC since Sept 2020     Allergies   Sertraline, Codeine, Depakote [divalproex sodium], Migranal [dihydroergotamine], and  Topamax [topiramate]   Review of Systems Review of Systems  Constitutional:  Positive for chills. Negative for appetite change, diaphoresis, fever and unexpected weight change.  HENT:  Negative for congestion, ear pain, sinus pressure, sinus pain, sneezing, sore throat and trouble swallowing.   Respiratory:  Negative for cough, chest tightness and shortness of breath.   Cardiovascular:  Negative for chest pain.  Gastrointestinal:  Positive for abdominal pain, blood in stool, constipation, nausea and vomiting. Negative for abdominal distention, anal bleeding, diarrhea and  rectal pain.  Genitourinary:  Negative for dysuria, flank pain, frequency and urgency.  Musculoskeletal:  Negative for back pain and myalgias.  Neurological:  Negative for dizziness, light-headedness and headaches.    Physical Exam Triage Vital Signs ED Triage Vitals  Enc Vitals Group     BP      Pulse      Resp      Temp      Temp src      SpO2      Weight      Height      Head Circumference      Peak Flow      Pain Score      Pain Loc      Pain Edu?      Excl. in [place]?    No data found.  Updated Vital Signs BP 103/76 (BP Location: Left Arm)   Pulse 84   Temp 98.7 F (37.1 C) (Oral)   Resp 18   SpO2 97%   Visual Acuity Right Eye Distance:   Left Eye Distance:   Bilateral Distance:    Right Eye Near:   Left Eye Near:    Bilateral Near:     Physical Exam Vitals reviewed.  Constitutional:      Appearance: Normal appearance. He is ill-appearing and diaphoretic.  HENT:     Head: Normocephalic and atraumatic.     Mouth/Throat:     Comments: Dry mucous membranes Eyes:     Extraocular Movements: Extraocular movements intact.     Pupils: Pupils are equal, round, and reactive to light.  Cardiovascular:     Rate and Rhythm: Normal rate and regular rhythm.     Pulses:          Radial pulses are 2+ on the right side and 2+ on the left side.     Heart sounds: Normal heart sounds.  Pulmonary:      Effort: Pulmonary effort is normal.     Breath sounds: Normal breath sounds. No wheezing, rhonchi or rales.  Abdominal:     General: Abdomen is flat. Bowel sounds are increased. There is no distension.     Palpations: Abdomen is soft. There is no mass.     Tenderness: There is abdominal tenderness in the epigastric area. There is guarding and rebound. There is no right CVA tenderness or left CVA tenderness.     Comments: SIGNIFICANT epigastric pain to palpation with guarding and rebound. Also with questionable CVAT.  Musculoskeletal:     Right lower leg: No edema.     Left lower leg: No edema.  Skin:    General: Skin is warm.     Capillary Refill: Capillary refill takes less than 2 seconds.     Comments: Poor skin turgor  Neurological:     General: No focal deficit present.     Mental Status: He is alert and oriented to person, place, and time.     Motor: Tremor present.     Comments: Bilateral UEs with 3+ resting tremor  Psychiatric:        Mood and Affect: Mood normal.        Behavior: Behavior normal.        Thought Content: Thought content normal.        Judgment: Judgment normal.     UC Treatments / Results  Labs (all labs ordered are listed, but only abnormal results are displayed) Labs Reviewed - No data to display  EKG  Radiology No results found.  Procedures Procedures (including critical care time)  Medications Ordered in UC Medications - No data to display  Initial Impression / Assessment and Plan / UC Course  I have reviewed the triage vital signs and the nursing notes.  Pertinent labs & imaging results that were available during my care of the patient were reviewed by me and considered in my medical decision making (see chart for details).     This patient is a very pleasant 60 y.o. year old male presenting with intractable nausea with vomiting, hematemesis, melena. Significant epigastric pain with rebound.  EKG with t-wave inversions in V1 when  compared with 02/2020 EKG. Vital signs are in normal range   Advised patient to head straight to the emergency department for further evaluation and management.  He is hemodynamically stable for transport in personal vehicle at this time, declines transport via EMS.  I am concerned for gastritis versus peptic ulcer versus pancreatitis versus hepatitis versus other.  This patient does have a history of alcohol use disorder and delirium tremens, though he denies recent alcohol use. He verbalizes that he plans to return home instead of the ED. Extensively discussed Ddx and why this requires urgent evaluation in ED. He verbalizes understanding.  Final Clinical Impressions(s) / UC Diagnoses   Final diagnoses:  Intractable nausea and vomiting  Abdominal pain, epigastric  Hematemesis with nausea  Melena  History of alcohol use disorder     Discharge Instructions      -I am concerned that you have a perforated peptic ulcer or gastritis, or even pancreatitis or hepatitis.  These conditions need to be evaluated urgently in the emergency department setting, including imaging, rapid blood work, IV fluids.  Please head straight to the emergency department.  If you become dizzy, weak, short of breath on the way-stop and call 911 immediately.  If you do not go to the emergency room, you are at risk for complications and even death.     ED Prescriptions   None    PDMP not reviewed this encounter.   Hazel Sams, PA-C 05/21/21 1016    Hazel Sams, PA-C 05/21/21 1103

## 2021-05-21 NOTE — Discharge Instructions (Addendum)
-  I am concerned that you have a perforated peptic ulcer or gastritis, or even pancreatitis or hepatitis.  These conditions need to be evaluated urgently in the emergency department setting, including imaging, rapid blood work, IV fluids.  Please head straight to the emergency department.  If you become dizzy, weak, short of breath on the way-stop and call 911 immediately.  If you do not go to the emergency room, you are at risk for complications and even death.

## 2021-05-21 NOTE — ED Provider Notes (Signed)
[facility] DEPT Provider Note   CSN: [id number] Arrival date & time: 05/21/21  1045     History Chief Complaint  Patient presents with   Abdominal Pain    Spencer Mendoza is a 60 y.o. male.  HPI 60 year old male presents with a chief complaint of chest pain and vomiting.  Overall this started on 6/30.  Started having chest to upper abdominal pain.  This has been sharp and severe.  He started vomiting on 7/1 and has not been able to stop and cannot tolerate any oral fluids.  He has not urinated in the last couple days.  He had a fever the first day.  He has had some cough as well.  The pain is currently severe.  He has not seen any blood with the emesis but a couple times he seen some black dots yesterday but no coffee ground type emesis.   Past Medical History:  Diagnosis Date   Alcohol withdrawal ([place]) 07/29/2013   Allergy    Anxiety    Benzodiazepine abuse ([place]) 08/30/2019   Bipolar disorder ([place])    Chronic insomnia 06/06/2015   Chronic migraine without aura, with intractable migraine, so stated, with status migrainosus 08/25/2019   Claustrophobia    Cocaine abuse ([place]) 05/22/2015   Delirium tremens ([place]) 07/29/2013   Depression    Epilepsy ([place])    last seizure week of 06-25-2020- sev. grand mal seizures per pt    Hemiplegic migraine with status migrainosus 06/06/2015   Hypertension    Migraine    Opioid use disorder 08/30/2019   PONV (postoperative nausea and vomiting)    Stroke [facility])    patient denies- states had a hemiplegic migraine in ~2011   Substance abuse [facility])     Patient Active Problem List   Diagnosis Date Noted   Exanthem due to herpes zoster 04/01/2021   Abdominal aortic atherosclerosis ([place]) with 50% occlusion distal aorta 03/05/2020   CAD (coronary artery disease) 03/05/2020   Osteoarthritis of left wrist 01/04/2020   Liver hemangioma 09/21/2019   Tobacco use 08/30/2019   COPD with chronic bronchitis ([place]) 08/30/2019    Chronic migraine without aura, with intractable migraine, so stated, with status migrainosus 08/25/2019   MDD (major depressive disorder), recurrent severe, without psychosis ([place]) 11/18/2018   Hemiplegic migraine with status migrainosus 06/06/2015   Chronic insomnia 06/06/2015   Seizures ([place]) 01/09/2014   Chronic low back pain 12/28/2013   History of stroke 12/04/2013   Hypertension 12/04/2013    Past Surgical History:  Procedure Laterality Date   ANKLE SURGERY     in high school   APPENDECTOMY     COLONOSCOPY     KNEE ARTHROSCOPY Right    NOSE SURGERY         Family History  Problem Relation Age of Onset   Heart disease Mother    Stroke Mother    Epilepsy Mother    Heart failure Mother    Cancer Father    Heart disease Father    Hypertension Father    Melanoma Father    Migraines Father    Colon cancer Sister 30   Colon cancer Cousin    Cancer Cousin    Colon polyps Neg Hx    Esophageal cancer Neg Hx    Rectal cancer Neg Hx    Stomach cancer Neg Hx     Social History   Tobacco Use   Smoking status: Every Day    Years: 15.00  Pack years: 0.00    Types: Cigarettes   Smokeless tobacco: Never   Tobacco comments:    Reports 2 cigarettes daily  Vaping Use   Vaping Use: Never used  Substance Use Topics   Alcohol use: Not Currently    Alcohol/week: 6.0 standard drinks    Types: 6 Cans of beer per week    Comment: none since Sept 2020   Drug use: Not Currently    Types: Marijuana    Comment: hx of crack, heroin; only THC since Sept 2020    Home Medications Prior to Admission medications   Medication Sig Start Date End Date Taking? Authorizing Provider  albuterol (VENTOLIN HFA) 108 (90 Base) MCG/ACT inhaler INHALE 2 PUFFS BY MOUTH INTO THE LUNGS EVERY 4 HOURS AS NEEDED FOR SHORTNESS OF BREATH OR WHEEZING 03/19/21   Elsie Stain, MD  ALPRAZolam Duanne Moron) 0.25 MG tablet Take 1 tablet (0.25 mg total) by mouth 3 (three) times daily as needed for anxiety.  03/19/21   Elsie Stain, MD  aspirin EC 81 MG tablet Take 1 tablet (81 mg total) by mouth daily. Swallow whole. 07/17/20   Jerline Pain, MD  budesonide-formoterol (SYMBICORT) 160-4.5 MCG/ACT inhaler Inhale 2 puffs into the lungs 2 (two) times daily. 03/19/21   Elsie Stain, MD  busPIRone (BUSPAR) 7.5 MG tablet TAKE 1 TABLET(7.5 MG) BY MOUTH DAILY 05/17/21   Elsie Stain, MD  calamine lotion Apply 1 application topically as needed for itching. Apply three times daily as needed to left thigh rash 04/01/21   Elsie Stain, MD  dexamethasone (DECADRON) 2 MG tablet Keep on hand if needed for acute headache, take 3, then take 2, then take 1 02/25/21   Suzzanne Cloud, NP  folic acid (FOLVITE) 1 MG tablet Take 1 tablet (1 mg total) by mouth daily. 03/19/21   Elsie Stain, MD  levETIRAcetam (KEPPRA) 1000 MG tablet Take 1 tablet (1,000 mg total) by mouth 2 (two) times daily. 02/25/21   Suzzanne Cloud, NP  predniSONE (DELTASONE) 10 MG tablet Take 4 tablets daily for 5 days then stop 04/01/21   Elsie Stain, MD  rosuvastatin (CRESTOR) 10 MG tablet Take 1 tablet (10 mg total) by mouth daily. 03/19/21   Elsie Stain, MD  thiamine 100 MG tablet Take 1 tablet (100 mg total) by mouth daily. 03/19/21   Elsie Stain, MD  traZODone (DESYREL) 100 MG tablet TAKE 2 TABLETS(200 MG) BY MOUTH AT BEDTIME 03/19/21   Elsie Stain, MD    Allergies    Sertraline, Codeine, Depakote [divalproex sodium], Migranal [dihydroergotamine], and Topamax [topiramate]  Review of Systems   Review of Systems  Constitutional:  Positive for fever.  Respiratory:  Positive for cough.   Cardiovascular:  Positive for chest pain.  Gastrointestinal:  Positive for abdominal pain and vomiting.  Genitourinary:  Positive for decreased urine volume.  All other systems reviewed and are negative.  Physical Exam Updated Vital Signs BP (!) 154/86   Pulse 76   Temp 98.2 F (36.8 C) (Oral)   Resp 18   SpO2 98%    Physical Exam Vitals and nursing note reviewed.  Constitutional:      General: He is in acute distress.     Appearance: He is well-developed. He is not ill-appearing or diaphoretic.  HENT:     Head: Normocephalic and atraumatic.     Right Ear: External ear normal.     Left Ear: External ear normal.  Nose: Nose normal.  Eyes:     General:        Right eye: No discharge.        Left eye: No discharge.  Cardiovascular:     Rate and Rhythm: Normal rate and regular rhythm.     Heart sounds: Normal heart sounds.  Pulmonary:     Effort: Pulmonary effort is normal.     Breath sounds: Normal breath sounds.  Abdominal:     Palpations: Abdomen is soft.     Tenderness: There is generalized abdominal tenderness. There is guarding.  Musculoskeletal:     Cervical back: Neck supple.  Skin:    General: Skin is warm and dry.  Neurological:     Mental Status: He is alert.  Psychiatric:        Mood and Affect: Mood is not anxious.    ED Results / Procedures / Treatments   Labs (all labs ordered are listed, but only abnormal results are displayed) Labs Reviewed  COMPREHENSIVE METABOLIC PANEL - Abnormal; Notable for the following components:      Result Value   Potassium 3.2 (*)    Glucose, Bld 109 (*)    Total Bilirubin 1.7 (*)    All other components within normal limits  CBC WITH DIFFERENTIAL/PLATELET - Abnormal; Notable for the following components:   WBC 11.2 (*)    MCHC 36.6 (*)    Monocytes Absolute 1.2 (*)    All other components within normal limits  LACTIC ACID, PLASMA - Abnormal; Notable for the following components:   Lactic Acid, Venous 2.5 (*)    All other components within normal limits  LACTIC ACID, PLASMA - Abnormal; Notable for the following components:   Lactic Acid, Venous 2.5 (*)    All other components within normal limits  URINALYSIS, ROUTINE W REFLEX MICROSCOPIC - Abnormal; Notable for the following components:   pH 9.0 (*)    Ketones, ur 5 (*)    All  other components within normal limits  RAPID URINE DRUG SCREEN, HOSP PERFORMED - Abnormal; Notable for the following components:   Tetrahydrocannabinol POSITIVE (*)    All other components within normal limits  I-STAT CHEM 8, ED - Abnormal; Notable for the following components:   Potassium 3.3 (*)    BUN 7 (*)    Glucose, Bld 109 (*)    All other components within normal limits  RESP PANEL BY RT-PCR (FLU A&B, COVID) ARPGX2  LIPASE, BLOOD  PROTIME-INR  TROPONIN I (HIGH SENSITIVITY)  TROPONIN I (HIGH SENSITIVITY)    EKG EKG Interpretation  Date/Time:  Tuesday May 21 2021 11:37:40 EDT Ventricular Rate:  80 PR Interval:  186 QRS Duration: 104 QT Interval:  392 QTC Calculation: 452 R Axis:   117 Text Interpretation: Normal sinus rhythm Right axis deviation Septal infarct , age undetermined Abnormal ECG Confirmed by Sherwood Gambler [phone]) on 05/21/2021 11:41:16 AM  Radiology DG Chest Portable 1 View  Result Date: 05/21/2021 CLINICAL DATA:  Chest pain. EXAM: PORTABLE CHEST 1 VIEW COMPARISON:  02/28/2020. FINDINGS: Mediastinum and hilar structures stable. Heart size stable. Lungs are clear. No pleural effusion or pneumothorax. Old bilateral rib fractures again noted. IMPRESSION: No acute cardiopulmonary disease. Electronically Signed   By: Marcello Moores  Register   On: 05/21/2021 12:23    Procedures Procedures   Medications Ordered in ED Medications  fentaNYL (SUBLIMAZE) injection 100 mcg (100 mcg Intravenous Given 05/21/21 1201)  ondansetron (ZOFRAN) injection 4 mg (4 mg Intravenous Given 05/21/21 1201)  lactated  ringers bolus 1,000 mL (0 mLs Intravenous Stopped 05/21/21 1331)  lactated ringers bolus 1,000 mL (1,000 mLs Intravenous New Bag/Given 05/21/21 1336)  potassium chloride SA (KLOR-CON) CR tablet 40 mEq (40 mEq Oral Given 05/21/21 1436)  fentaNYL (SUBLIMAZE) injection 100 mcg (100 mcg Intravenous Given 05/21/21 1436)  metoCLOPramide (REGLAN) injection 10 mg (10 mg Intravenous Given 05/21/21  1436)  iohexol (OMNIPAQUE) 350 MG/ML injection 100 mL (100 mLs Intravenous Contrast Given 05/21/21 1450)    ED Course  I have reviewed the triage vital signs and the nursing notes.  Pertinent labs & imaging results that were available during my care of the patient were reviewed by me and considered in my medical decision making (see chart for details).    MDM Rules/Calculators/A&P                          Patient has fluctuating symptoms.  At times he is writhing in pain and other times he is well-appearing.  He is having some dry heaving but no vomiting.  Initial labs are pretty unremarkable besides a slight hypokalemia and mild lactic acidosis.  I favor this is likely dehydration rather than infection.  Chest x-ray is clear.  He does have some vascular disease on his previous CT angiography a year ago and so we will get CTA to help rule out mesenteric ischemia/clot. Care to Dr. Roderic Palau Final Clinical Impression(s) / ED Diagnoses Final diagnoses:  None    Rx / DC Orders ED Discharge Orders     None        Sherwood Gambler, MD 05/21/21 1537

## 2021-05-21 NOTE — ED Notes (Signed)
Reported results of Chem 8 to [facility] MD

## 2021-05-21 NOTE — ED Triage Notes (Signed)
Pt reports has not been able to keep anything down since Friday, reports has urinated one time since then. Also reports severe pain down center of chest/ribcage into epigastrium, as well as in upper/lower back.

## 2021-05-21 NOTE — ED Triage Notes (Signed)
Pt sent from urgent care d/t concern for perforated peptic ulcer or gastritis. He reports pain in epigastric area for the past 4 days.

## 2021-05-22 ENCOUNTER — Emergency Department (HOSPITAL_COMMUNITY)
Admission: EM | Admit: 2021-05-22 | Discharge: 2021-05-22 | Disposition: A | Discharge status: Home / Self Care | Payer: Medicare Other | Attending: Emergency Medicine | Admitting: Emergency Medicine

## 2021-05-22 ENCOUNTER — Encounter (HOSPITAL_COMMUNITY): Payer: Self-pay

## 2021-05-22 DIAGNOSIS — R1013 Epigastric pain: Secondary | ICD-10-CM | POA: Insufficient documentation

## 2021-05-22 DIAGNOSIS — Z5321 Procedure and treatment not carried out due to patient leaving prior to being seen by health care provider: Secondary | ICD-10-CM | POA: Insufficient documentation

## 2021-05-22 DIAGNOSIS — R112 Nausea with vomiting, unspecified: Secondary | ICD-10-CM | POA: Insufficient documentation

## 2021-05-22 NOTE — ED Triage Notes (Addendum)
Patient reports he was here yesterday and discharged. Referred to gastrology and unable to get an appointment.   Patient reports at 4am waking up and vomiting red blood X2.   C/o nausea and dry heaving.   C/o epigastric/abdominal pain  10/10 pain   A/Ox4 Ambulatory in triage

## 2021-05-22 NOTE — ED Notes (Signed)
Patient reports he is not going to wait and states, "Im out" and hands this RN his paper and walks out. This RN encouraged patient to stay and be seen by provider for his first examine patient continue to walk out.

## 2021-05-23 ENCOUNTER — Encounter (HOSPITAL_COMMUNITY): Payer: Self-pay

## 2021-05-23 ENCOUNTER — Other Ambulatory Visit (HOSPITAL_COMMUNITY): Payer: Self-pay

## 2021-05-23 ENCOUNTER — Emergency Department (HOSPITAL_COMMUNITY): Payer: Medicare Other

## 2021-05-23 ENCOUNTER — Emergency Department (HOSPITAL_COMMUNITY)
Admission: EM | Admit: 2021-05-23 | Discharge: 2021-05-23 | Disposition: A | Discharge status: Home / Self Care | Payer: Medicare Other | Attending: Emergency Medicine | Admitting: Emergency Medicine

## 2021-05-23 DIAGNOSIS — Z7982 Long term (current) use of aspirin: Secondary | ICD-10-CM | POA: Insufficient documentation

## 2021-05-23 DIAGNOSIS — R001 Bradycardia, unspecified: Secondary | ICD-10-CM | POA: Diagnosis not present

## 2021-05-23 DIAGNOSIS — E860 Dehydration: Secondary | ICD-10-CM | POA: Diagnosis not present

## 2021-05-23 DIAGNOSIS — I2510 Atherosclerotic heart disease of native coronary artery without angina pectoris: Secondary | ICD-10-CM | POA: Diagnosis not present

## 2021-05-23 DIAGNOSIS — R109 Unspecified abdominal pain: Secondary | ICD-10-CM | POA: Insufficient documentation

## 2021-05-23 DIAGNOSIS — F17210 Nicotine dependence, cigarettes, uncomplicated: Secondary | ICD-10-CM | POA: Insufficient documentation

## 2021-05-23 DIAGNOSIS — I10 Essential (primary) hypertension: Secondary | ICD-10-CM | POA: Insufficient documentation

## 2021-05-23 DIAGNOSIS — R1110 Vomiting, unspecified: Secondary | ICD-10-CM | POA: Insufficient documentation

## 2021-05-23 DIAGNOSIS — Z7951 Long term (current) use of inhaled steroids: Secondary | ICD-10-CM | POA: Diagnosis not present

## 2021-05-23 DIAGNOSIS — J449 Chronic obstructive pulmonary disease, unspecified: Secondary | ICD-10-CM | POA: Insufficient documentation

## 2021-05-23 LAB — CBC WITH DIFFERENTIAL/PLATELET
Abs Immature Granulocytes: 0.03 10*3/uL (ref 0.00–0.07)
Basophils Absolute: 0.1 10*3/uL (ref 0.0–0.1)
Basophils Relative: 1 %
Eosinophils Absolute: 0.2 10*3/uL (ref 0.0–0.5)
Eosinophils Relative: 3 %
HCT: 42.4 % (ref 39.0–52.0)
Hemoglobin: 14.9 g/dL (ref 13.0–17.0)
Immature Granulocytes: 0 %
Lymphocytes Relative: 18 %
Lymphs Abs: 1.5 10*3/uL (ref 0.7–4.0)
MCH: 31.6 pg (ref 26.0–34.0)
MCHC: 35.1 g/dL (ref 30.0–36.0)
MCV: 90 fL (ref 80.0–100.0)
Monocytes Absolute: 0.8 10*3/uL (ref 0.1–1.0)
Monocytes Relative: 9 %
Neutro Abs: 6 10*3/uL (ref 1.7–7.7)
Neutrophils Relative %: 69 %
Platelets: 202 10*3/uL (ref 150–400)
RBC: 4.71 MIL/uL (ref 4.22–5.81)
RDW: 13.9 % (ref 11.5–15.5)
WBC: 8.6 10*3/uL (ref 4.0–10.5)
nRBC: 0 % (ref 0.0–0.2)

## 2021-05-23 LAB — COMPREHENSIVE METABOLIC PANEL
ALT: 21 U/L (ref 0–44)
AST: 26 U/L (ref 15–41)
Albumin: 4.5 g/dL (ref 3.5–5.0)
Alkaline Phosphatase: 60 U/L (ref 38–126)
Anion gap: 11 (ref 5–15)
BUN: 7 mg/dL — ABNORMAL LOW (ref 8–23)
CO2: 26 mmol/L (ref 22–32)
Calcium: 9.4 mg/dL (ref 8.9–10.3)
Chloride: 99 mmol/L (ref 98–111)
Creatinine, Ser: 0.88 mg/dL (ref 0.61–1.24)
GFR, Estimated: >60 mL/min (ref >60)
Glucose, Bld: 143 mg/dL — ABNORMAL HIGH (ref 70–99)
Potassium: 2.8 mmol/L — ABNORMAL LOW (ref 3.5–5.1)
Sodium: 136 mmol/L (ref 135–145)
Total Bilirubin: 1.3 mg/dL — ABNORMAL HIGH (ref 0.3–1.2)
Total Protein: 6.9 g/dL (ref 6.5–8.1)

## 2021-05-23 LAB — TROPONIN I (HIGH SENSITIVITY): Troponin I (High Sensitivity): 6 ng/L (ref <18)

## 2021-05-23 LAB — LIPASE, BLOOD: Lipase: 27 U/L (ref 11–51)

## 2021-05-23 MED ORDER — POTASSIUM CHLORIDE 10 MEQ/100ML IV SOLN
10.0000 meq | INTRAVENOUS | Status: AC
  Administered 2021-05-23 (×2): 10 meq via INTRAVENOUS
  Filled 2021-05-23: qty 100

## 2021-05-23 MED ORDER — SODIUM CHLORIDE 0.9 % IV BOLUS
1000.0000 mL | Freq: Once | INTRAVENOUS | Status: AC
  Administered 2021-05-23: 1000 mL via INTRAVENOUS

## 2021-05-23 MED ORDER — PANTOPRAZOLE 80MG IVPB - SIMPLE MED
80.0000 mg | Freq: Once | INTRAVENOUS | Status: AC
  Administered 2021-05-23: 80 mg via INTRAVENOUS
  Filled 2021-05-23: qty 80

## 2021-05-23 MED ORDER — ONDANSETRON 4 MG PO TBDP
ORAL_TABLET | ORAL | 0 refills | Status: DC
  Filled 2021-05-23: qty 12, 4d supply, fill #0

## 2021-05-23 MED ORDER — HYDROMORPHONE HCL 1 MG/ML IJ SOLN
1.0000 mg | Freq: Once | INTRAMUSCULAR | Status: AC
Start: 2021-05-23 — End: 2021-05-23
  Administered 2021-05-23: 1 mg via INTRAVENOUS
  Filled 2021-05-23: qty 1

## 2021-05-23 MED ORDER — MORPHINE SULFATE (PF) 4 MG/ML IV SOLN
4.0000 mg | Freq: Once | INTRAVENOUS | Status: AC
  Administered 2021-05-23: 4 mg via INTRAVENOUS
  Filled 2021-05-23: qty 1

## 2021-05-23 MED ORDER — FAMOTIDINE IN NACL 20-0.9 MG/50ML-% IV SOLN
20.0000 mg | Freq: Once | INTRAVENOUS | Status: AC
  Administered 2021-05-23: 20 mg via INTRAVENOUS
  Filled 2021-05-23: qty 50

## 2021-05-23 MED ORDER — POTASSIUM CHLORIDE CRYS ER 20 MEQ PO TBCR
40.0000 meq | EXTENDED_RELEASE_TABLET | Freq: Once | ORAL | Status: AC
  Administered 2021-05-23: 40 meq via ORAL
  Filled 2021-05-23: qty 2

## 2021-05-23 MED ORDER — SODIUM CHLORIDE 0.9 % IV SOLN
25.0000 mg | Freq: Once | INTRAVENOUS | Status: AC
  Administered 2021-05-23: 25 mg via INTRAVENOUS
  Filled 2021-05-23: qty 25

## 2021-05-23 MED ORDER — PANTOPRAZOLE SODIUM 20 MG PO TBEC
20.0000 mg | DELAYED_RELEASE_TABLET | Freq: Every day | ORAL | 0 refills | Status: DC
  Filled 2021-05-23 (×3): qty 30, 30d supply, fill #0

## 2021-05-23 MED ORDER — ONDANSETRON HCL 4 MG/2ML IJ SOLN
4.0000 mg | Freq: Once | INTRAMUSCULAR | Status: AC
  Administered 2021-05-23: 4 mg via INTRAVENOUS
  Filled 2021-05-23: qty 2

## 2021-05-23 NOTE — ED Triage Notes (Signed)
Pt arrived via POV, c/o n/v and diffuse abd pain. States he was seen multiple times recently for same. Unable to get follow up with gastro soon. Unable to obtain rx.

## 2021-05-23 NOTE — Discharge Instructions (Addendum)
Start taking your Zofran and Protonix.  We will give you those medicines here.  If you want your oxycodone you can go back to the pharmacy where it was sent to and they should fill the oxycodone.  Go back to [facility] GI and make an appointment as soon as they can see you

## 2021-05-23 NOTE — ED Provider Notes (Signed)
Patient did not fill his protonic Zofran and oxycodone when he was seen last time.  He states that his Medicare would not pay with protonic with Zofran and he was afraid he was allergic to oxycodone because he is allergic to codeine.  I spoke with the social worker and we will take care of refilling his protonic and Zofran here and I told the patient that he can get the oxycodone filled where he went before.  He only did not get that medicine because he thought he was allergic to codeine and he could not take oxycodone.  He will follow-up with a lower GI at some point   Milton Ferguson, MD 05/23/21 [id number]

## 2021-05-23 NOTE — Discharge Planning (Signed)
RNCM consulted regarding medication assistance of pt unable to afford medications.  RNCM utilized Transitions of Care petty cash to cover co-pay and filled through Transitions of Pharmacy.  Rx e-scribed to [facility] and EDSW will pick up and deliver to pt prior to discharge home.  No further RNCM needs identified at this time.

## 2021-05-23 NOTE — ED Notes (Signed)
Pt provided saltines and water for fluid challenge

## 2021-05-23 NOTE — ED Provider Notes (Signed)
[facility] DEPT Provider Note   CSN: [id number] Arrival date & time: 05/23/21  0355     History Chief Complaint  Patient presents with   Emesis    BRYSEN SHANKMAN is a 60 y.o. male hx of alcohol abuse, HTN, here presenting with abdominal pain.  Patient was seen here yesterday for abdominal pain and had a CTA that was unremarkable.  Patient was prescribed Zofran and oxycodone and protonix but was unable to fill the prescription due to insurance problems.  Patient states that since then he has been persistent vomiting.  He was vomiting some blood before his ED visit and has more bloody vomit since then.  He states that it is blood-tinged.  He denies blood in his stool.  He denies drinking alcohol after the ED visit  The history is provided by the patient.      Past Medical History:  Diagnosis Date   Alcohol withdrawal ([place]) 07/29/2013   Allergy    Anxiety    Benzodiazepine abuse ([place]) 08/30/2019   Bipolar disorder ([place] )    Chronic insomnia 06/06/2015   Chronic migraine without aura, with intractable migraine, so stated, with status migrainosus 08/25/2019   Claustrophobia    Cocaine abuse ([place]) 05/22/2015   Delirium tremens ([place]) 07/29/2013   Depression    Epilepsy ([place])    last seizure week of 06-25-2020- sev. grand mal seizures per pt    Hemiplegic migraine with status migrainosus 06/06/2015   Hypertension    Migraine    Opioid use disorder 08/30/2019   PONV (postoperative nausea and vomiting)    Stroke [facility])    patient denies- states had a hemiplegic migraine in ~2011   Substance abuse [facility])     Patient Active Problem List   Diagnosis Date Noted   Exanthem due to herpes zoster 04/01/2021   Abdominal aortic atherosclerosis ([place]) with 50% occlusion distal aorta 03/05/2020   CAD (coronary artery disease) 03/05/2020   Osteoarthritis of left wrist 01/04/2020   Liver hemangioma 09/21/2019   Tobacco use 08/30/2019   COPD with chronic bronchitis  ([place]) 08/30/2019   Chronic migraine without aura, with intractable migraine, so stated, with status migrainosus 08/25/2019   MDD (major depressive disorder), recurrent severe, without psychosis ([place]) 11/18/2018   Hemiplegic migraine with status migrainosus 06/06/2015   Chronic insomnia 06/06/2015   Seizures ([place]) 01/09/2014   Chronic low back pain 12/28/2013   History of stroke 12/04/2013   Hypertension 12/04/2013    Past Surgical History:  Procedure Laterality Date   ANKLE SURGERY     in high school   APPENDECTOMY     COLONOSCOPY     KNEE ARTHROSCOPY Right    NOSE SURGERY         Family History  Problem Relation Age of Onset   Heart disease Mother    Stroke Mother    Epilepsy Mother    Heart failure Mother    Cancer Father    Heart disease Father    Hypertension Father    Melanoma Father    Migraines Father    Colon cancer Sister 87   Colon cancer Cousin    Cancer Cousin    Colon polyps Neg Hx    Esophageal cancer Neg Hx    Rectal cancer Neg Hx    Stomach cancer Neg Hx     Social History   Tobacco Use   Smoking status: Every Day    Years: 15.00    Pack years: 0.00  Types: Cigarettes   Smokeless tobacco: Never   Tobacco comments:    Reports 2 cigarettes daily  Vaping Use   Vaping Use: Never used  Substance Use Topics   Alcohol use: Not Currently    Alcohol/week: 6.0 standard drinks    Types: 6 Cans of beer per week    Comment: none since Sept 2020   Drug use: Not Currently    Types: Marijuana    Comment: hx of crack, heroin; only THC since Sept 2020    Home Medications Prior to Admission medications   Medication Sig Start Date End Date Taking? Authorizing Provider  albuterol (VENTOLIN HFA) 108 (90 Base) MCG/ACT inhaler INHALE 2 PUFFS BY MOUTH INTO THE LUNGS EVERY 4 HOURS AS NEEDED FOR SHORTNESS OF BREATH OR WHEEZING 03/19/21   Elsie Stain, MD  ALPRAZolam Duanne Moron) 0.25 MG tablet Take 1 tablet (0.25 mg total) by mouth 3 (three) times daily as  needed for anxiety. 03/19/21   Elsie Stain, MD  aspirin EC 81 MG tablet Take 1 tablet (81 mg total) by mouth daily. Swallow whole. 07/17/20   Jerline Pain, MD  budesonide-formoterol (SYMBICORT) 160-4.5 MCG/ACT inhaler Inhale 2 puffs into the lungs 2 (two) times daily. 03/19/21   Elsie Stain, MD  busPIRone (BUSPAR) 7.5 MG tablet TAKE 1 TABLET(7.5 MG) BY MOUTH DAILY 05/17/21   Elsie Stain, MD  calamine lotion Apply 1 application topically as needed for itching. Apply three times daily as needed to left thigh rash 04/01/21   Elsie Stain, MD  dexamethasone (DECADRON) 2 MG tablet Keep on hand if needed for acute headache, take 3, then take 2, then take 1 02/25/21   Suzzanne Cloud, NP  folic acid (FOLVITE) 1 MG tablet Take 1 tablet (1 mg total) by mouth daily. 03/19/21   Elsie Stain, MD  levETIRAcetam (KEPPRA) 1000 MG tablet Take 1 tablet (1,000 mg total) by mouth 2 (two) times daily. 02/25/21   Suzzanne Cloud, NP  ondansetron (ZOFRAN ODT) 4 MG disintegrating tablet 4mg  ODT q6 hours prn nausea/vomit 05/21/21   Milton Ferguson, MD  oxyCODONE-acetaminophen (PERCOCET) 5-325 MG tablet Take 1 tablet by mouth every 6 (six) hours as needed. 05/21/21   Milton Ferguson, MD  pantoprazole (PROTONIX) 20 MG tablet Take 1 tablet (20 mg total) by mouth daily. 05/21/21   Milton Ferguson, MD  predniSONE (DELTASONE) 10 MG tablet Take 4 tablets daily for 5 days then stop 04/01/21   Elsie Stain, MD  rosuvastatin (CRESTOR) 10 MG tablet Take 1 tablet (10 mg total) by mouth daily. 03/19/21   Elsie Stain, MD  thiamine 100 MG tablet Take 1 tablet (100 mg total) by mouth daily. 03/19/21   Elsie Stain, MD  traZODone (DESYREL) 100 MG tablet TAKE 2 TABLETS(200 MG) BY MOUTH AT BEDTIME 03/19/21   Elsie Stain, MD    Allergies    Sertraline, Codeine, Depakote [divalproex sodium], Migranal [dihydroergotamine], and Topamax [topiramate]  Review of Systems   Review of Systems  Gastrointestinal:  Positive for  abdominal pain and vomiting.  All other systems reviewed and are negative.  Physical Exam Updated Vital Signs BP 130/82   Pulse 73   Temp 97.8 F (36.6 C) (Oral)   Resp 16   SpO2 94%   Physical Exam Vitals and nursing note reviewed.  Constitutional:      Comments: Uncomfortable and throwing up  HENT:     Head: Normocephalic.     Nose: Nose  normal.     Mouth/Throat:     Mouth: Mucous membranes are dry.  Eyes:     Extraocular Movements: Extraocular movements intact.     Pupils: Pupils are equal, round, and reactive to light.  Cardiovascular:     Rate and Rhythm: Normal rate and regular rhythm.     Pulses: Normal pulses.     Heart sounds: Normal heart sounds.  Pulmonary:     Effort: Pulmonary effort is normal.     Breath sounds: Normal breath sounds.  Abdominal:     General: Abdomen is flat.     Comments: Epigastric tenderness  Musculoskeletal:        General: Normal range of motion.     Cervical back: Normal range of motion and neck supple.  Skin:    General: Skin is warm.     Capillary Refill: Capillary refill takes less than 2 seconds.  Neurological:     General: No focal deficit present.     Mental Status: He is oriented to person, place, and time.  Psychiatric:        Mood and Affect: Mood normal.        Behavior: Behavior normal.    ED Results / Procedures / Treatments   Labs (all labs ordered are listed, but only abnormal results are displayed) Labs Reviewed  COMPREHENSIVE METABOLIC PANEL - Abnormal; Notable for the following components:      Result Value   Potassium 2.8 (*)    Glucose, Bld 143 (*)    BUN 7 (*)    Total Bilirubin 1.3 (*)    All other components within normal limits  CBC WITH DIFFERENTIAL/PLATELET  LIPASE, BLOOD  TROPONIN I (HIGH SENSITIVITY)    EKG None  Radiology DG Chest Portable 1 View  Result Date: 05/21/2021 CLINICAL DATA:  Chest pain. EXAM: PORTABLE CHEST 1 VIEW COMPARISON:  02/28/2020. FINDINGS: Mediastinum and hilar  structures stable. Heart size stable. Lungs are clear. No pleural effusion or pneumothorax. Old bilateral rib fractures again noted. IMPRESSION: No acute cardiopulmonary disease. Electronically Signed   By: Marcello Moores  Register   On: 05/21/2021 12:23   DG ABD ACUTE 2+V W 1V CHEST  Result Date: 05/23/2021 CLINICAL DATA:  Abdominal pain and vomiting EXAM: DG ABDOMEN ACUTE WITH 1 VIEW CHEST COMPARISON:  Two days ago FINDINGS: Normal heart size and mediastinal contours. No acute infiltrate or edema. No effusion or pneumothorax. No acute osseous findings. Atheromatous calcification of the aorta. Normal bowel gas pattern. No concerning mass effect or gas collection. IMPRESSION: No acute finding in the chest or abdomen. Electronically Signed   By: Monte Fantasia M.D.   On: 05/23/2021 05:01   CT Angio Abd/Pel W and/or Wo Contrast  Result Date: 05/21/2021 CLINICAL DATA:  Mesenteric ischemia. Puff rated ulcers/gastritis. Epigastric pain for past 4 days. EXAM: CTA ABDOMEN AND PELVIS WITHOUT AND WITH CONTRAST TECHNIQUE: Multidetector CT imaging of the abdomen and pelvis was performed using the standard protocol during bolus administration of intravenous contrast. Multiplanar reconstructed images and MIPs were obtained and reviewed to evaluate the vascular anatomy. CONTRAST:  177mL OMNIPAQUE IOHEXOL 350 MG/ML SOLN COMPARISON:  02/28/2020 FINDINGS: VASCULAR Aorta: Scattered calcified plaque without aneurysm or stenosis. Celiac: Patent without evidence of aneurysm, dissection, vasculitis or significant stenosis. SMA: Patent without evidence of aneurysm, dissection, vasculitis or significant stenosis. Renals: Moderate stenosis at the origin of the left main renal artery. No significant stenosis accessory left lower pole renal artery. No significant stenosis of right main renal artery. IMA:  Moderate stenosis at the origin.  Otherwise patent. Inflow: Scattered atheromatous plaque without flow-limiting stenosis. Proximal Outflow:  Bilateral common femoral and visualized portions of the superficial and profunda femoral arteries are patent without evidence of aneurysm, dissection, vasculitis or significant stenosis. Veins: No significant abnormality of the hepatic, portal, superior mesenteric, or splenic veins. Review of the MIP images confirms the above findings. NON-VASCULAR Lower chest: No acute abnormality. Hepatobiliary: Known hypodense lesion in segment 7 of the right hepatic lobe (image 47, series 7) is much better evaluated on prior MRI from 09/20/2019 which showed this to be a hemangioma. It is not well evaluated on the current exam. Liver, gallbladder, and bile ducts otherwise unremarkable. Pancreas: Unremarkable. No pancreatic ductal dilatation or surrounding inflammatory changes. Spleen: Normal in size without focal abnormality. Adrenals/Urinary Tract: Adrenal glands are normal. Kidneys, ureters, and bladder normal in appearance. Stomach/Bowel: Stomach is within normal limits. Appendix is not definitely identified. No evidence of bowel wall thickening, distention, or inflammatory changes. Lymphatic: No significant vascular findings are present. No enlarged abdominal or pelvic lymph nodes. Reproductive: Prostate is unremarkable. Other: No abdominal wall hernia or abnormality. No abdominopelvic ascites. Musculoskeletal: No acute or significant osseous findings. IMPRESSION: VASCULAR No significant mesenteric vascular abnormality identified. NON-VASCULAR No acute abnormality of the abdomen or pelvis. Electronically Signed   By: Miachel Roux M.D.   On: 05/21/2021 15:33    Procedures Procedures   Medications Ordered in ED Medications  potassium chloride 10 mEq in 100 mL IVPB (10 mEq Intravenous New Bag/Given 05/23/21 0627)  famotidine (PEPCID) IVPB 20 mg premix (0 mg Intravenous Stopped 05/23/21 0537)  sodium chloride 0.9 % bolus 1,000 mL (0 mLs Intravenous Stopped 05/23/21 0625)  promethazine (PHENERGAN) 25 mg in sodium chloride 0.9  % 50 mL IVPB (0 mg Intravenous Stopped 05/23/21 0537)  pantoprazole (PROTONIX) 80 mg /NS 100 mL IVPB (0 mg Intravenous Stopped 05/23/21 0537)  morphine 4 MG/ML injection 4 mg (4 mg Intravenous Given 05/23/21 0504)  HYDROmorphone (DILAUDID) injection 1 mg (1 mg Intravenous Given 05/23/21 0625)  potassium chloride SA (KLOR-CON) CR tablet 40 mEq (40 mEq Oral Given 05/23/21 0625)  ondansetron (ZOFRAN) injection 4 mg (4 mg Intravenous Given 05/23/21 0631)  sodium chloride 0.9 % bolus 1,000 mL (1,000 mLs Intravenous New Bag/Given 05/23/21 2585)    ED Course  I have reviewed the triage vital signs and the nursing notes.  Pertinent labs & imaging results that were available during my care of the patient were reviewed by me and considered in my medical decision making (see chart for details).    MDM Rules/Calculators/A&P                         CACHE BILLS is a 60 y.o. male here presenting with abdominal pain and vomiting.  I reviewed his ED visit from yesterday.  He had extensive work-up including a normal CT abdomen pelvis.  Will repeat CBC and CMP and lipase and get acute abdominal series.  We will give IV fluids and Zofran and pain medicine and reassess  7:03 AM Patient's potassium is 2.8 and is getting supplemented.  Patient felt better after pain medicine and potassium.  However around 7 AM, patient states that he stood up to urinate and apparently had a syncopal episode.  Denies any seizure activity.  Plan to get EKG and troponin. Signed out to Dr. Roderic Palau to follow up EKG and trop and reassess patient. If patient vomits again, will likely need  admission. Otherwise, can be discharged. Left message with case management regarding his medication needs   Final Clinical Impression(s) / ED Diagnoses Final diagnoses:  None    Rx / DC Orders ED Discharge Orders     None        Drenda Freeze, MD 05/23/21 [phone]

## 2021-05-23 NOTE — ED Notes (Signed)
Pt to X-Ray via stretcher 

## 2021-05-23 NOTE — ED Notes (Signed)
Pt tolerated water and potassium pills. No vomiting after taking

## 2021-05-23 NOTE — Discharge Planning (Addendum)
RNCM consulted in regards to medication assistance.  Pt has insurance coverage and is not eligible for Medication Assistance Through [place] (MATCH) program. 

## 2021-05-27 ENCOUNTER — Encounter: Payer: Self-pay | Admitting: Nurse Practitioner

## 2021-05-29 ENCOUNTER — Telehealth: Payer: Self-pay | Admitting: Critical Care Medicine

## 2021-05-29 DIAGNOSIS — R1013 Epigastric pain: Secondary | ICD-10-CM

## 2021-05-29 NOTE — Telephone Encounter (Signed)
Copied from [place] [id number]. Topic: Appointment Scheduling - Scheduling Inquiry for Clinic >> May 28, 2021  3:13 PM Spencer Mendoza wrote: Reason for CRM: Pt called saying he was in the ER last week for throwing up.  They gave him a lot pain medication and told him to see a GI doctor.  He has an appt in August but can not wait that long.  He is still throwing up and very weak,.  CB#  [phone]  Patient is asking if a referral to another GI doctor can be given to see if he can be fit in sooner than August.

## 2021-05-31 ENCOUNTER — Ambulatory Visit: Payer: Self-pay | Admitting: *Deleted

## 2021-05-31 ENCOUNTER — Emergency Department (HOSPITAL_COMMUNITY): Payer: Medicare Other

## 2021-05-31 ENCOUNTER — Emergency Department (HOSPITAL_COMMUNITY)
Admission: EM | Admit: 2021-05-31 | Discharge: 2021-05-31 | Disposition: A | Discharge status: Home / Self Care | Payer: Medicare Other | Attending: Emergency Medicine | Admitting: Emergency Medicine

## 2021-05-31 ENCOUNTER — Other Ambulatory Visit: Payer: Self-pay

## 2021-05-31 ENCOUNTER — Encounter (HOSPITAL_COMMUNITY): Payer: Self-pay

## 2021-05-31 DIAGNOSIS — Z7982 Long term (current) use of aspirin: Secondary | ICD-10-CM | POA: Diagnosis not present

## 2021-05-31 DIAGNOSIS — F17210 Nicotine dependence, cigarettes, uncomplicated: Secondary | ICD-10-CM | POA: Diagnosis not present

## 2021-05-31 DIAGNOSIS — I2510 Atherosclerotic heart disease of native coronary artery without angina pectoris: Secondary | ICD-10-CM | POA: Insufficient documentation

## 2021-05-31 DIAGNOSIS — Z7951 Long term (current) use of inhaled steroids: Secondary | ICD-10-CM | POA: Insufficient documentation

## 2021-05-31 DIAGNOSIS — Y900 Blood alcohol level of less than 20 mg/100 ml: Secondary | ICD-10-CM | POA: Diagnosis not present

## 2021-05-31 DIAGNOSIS — J449 Chronic obstructive pulmonary disease, unspecified: Secondary | ICD-10-CM | POA: Diagnosis not present

## 2021-05-31 DIAGNOSIS — R1013 Epigastric pain: Secondary | ICD-10-CM

## 2021-05-31 DIAGNOSIS — Z8505 Personal history of malignant neoplasm of liver: Secondary | ICD-10-CM | POA: Insufficient documentation

## 2021-05-31 DIAGNOSIS — R112 Nausea with vomiting, unspecified: Secondary | ICD-10-CM | POA: Insufficient documentation

## 2021-05-31 DIAGNOSIS — Z79899 Other long term (current) drug therapy: Secondary | ICD-10-CM | POA: Diagnosis not present

## 2021-05-31 DIAGNOSIS — I10 Essential (primary) hypertension: Secondary | ICD-10-CM | POA: Diagnosis not present

## 2021-05-31 DIAGNOSIS — R109 Unspecified abdominal pain: Secondary | ICD-10-CM | POA: Diagnosis not present

## 2021-05-31 LAB — CBC WITH DIFFERENTIAL/PLATELET
Abs Immature Granulocytes: 0.01 10*3/uL (ref 0.00–0.07)
Basophils Absolute: 0.1 10*3/uL (ref 0.0–0.1)
Basophils Relative: 1 %
Eosinophils Absolute: 0.2 10*3/uL (ref 0.0–0.5)
Eosinophils Relative: 3 %
HCT: 44.5 % (ref 39.0–52.0)
Hemoglobin: 15.6 g/dL (ref 13.0–17.0)
Immature Granulocytes: 0 %
Lymphocytes Relative: 22 %
Lymphs Abs: 1.9 10*3/uL (ref 0.7–4.0)
MCH: 31.9 pg (ref 26.0–34.0)
MCHC: 35.1 g/dL (ref 30.0–36.0)
MCV: 91 fL (ref 80.0–100.0)
Monocytes Absolute: 0.6 10*3/uL (ref 0.1–1.0)
Monocytes Relative: 7 %
Neutro Abs: 5.7 10*3/uL (ref 1.7–7.7)
Neutrophils Relative %: 67 %
Platelets: 283 10*3/uL (ref 150–400)
RBC: 4.89 MIL/uL (ref 4.22–5.81)
RDW: 13.6 % (ref 11.5–15.5)
WBC: 8.5 10*3/uL (ref 4.0–10.5)
nRBC: 0 % (ref 0.0–0.2)

## 2021-05-31 LAB — COMPREHENSIVE METABOLIC PANEL
ALT: 14 U/L (ref 0–44)
AST: 20 U/L (ref 15–41)
Albumin: 4.5 g/dL (ref 3.5–5.0)
Alkaline Phosphatase: 56 U/L (ref 38–126)
Anion gap: 9 (ref 5–15)
BUN: 8 mg/dL (ref 8–23)
CO2: 26 mmol/L (ref 22–32)
Calcium: 10 mg/dL (ref 8.9–10.3)
Chloride: 101 mmol/L (ref 98–111)
Creatinine, Ser: 1.02 mg/dL (ref 0.61–1.24)
GFR, Estimated: >60 mL/min (ref >60)
Glucose, Bld: 108 mg/dL — ABNORMAL HIGH (ref 70–99)
Potassium: 3.9 mmol/L (ref 3.5–5.1)
Sodium: 136 mmol/L (ref 135–145)
Total Bilirubin: 0.9 mg/dL (ref 0.3–1.2)
Total Protein: 7.3 g/dL (ref 6.5–8.1)

## 2021-05-31 LAB — ETHANOL: Alcohol, Ethyl (B): <10 mg/dL (ref <10)

## 2021-05-31 LAB — LIPASE, BLOOD: Lipase: 29 U/L (ref 11–51)

## 2021-05-31 LAB — ACETAMINOPHEN LEVEL: Acetaminophen (Tylenol), Serum: <10 ug/mL — ABNORMAL LOW (ref 10–30)

## 2021-05-31 MED ORDER — LORAZEPAM 2 MG/ML IJ SOLN
1.0000 mg | Freq: Once | INTRAMUSCULAR | Status: AC
  Administered 2021-05-31: 1 mg via INTRAVENOUS
  Filled 2021-05-31: qty 1

## 2021-05-31 MED ORDER — ONDANSETRON 4 MG PO TBDP: ORAL_TABLET | ORAL | 0 refills | Status: DC

## 2021-05-31 MED ORDER — SUCRALFATE 1 G PO TABS: 1.0000 g | ORAL_TABLET | Freq: Three times a day (TID) | ORAL | 0 refills | Status: DC

## 2021-05-31 MED ORDER — SODIUM CHLORIDE 0.9 % IV BOLUS
1000.0000 mL | Freq: Once | INTRAVENOUS | Status: AC
  Administered 2021-05-31: 1000 mL via INTRAVENOUS

## 2021-05-31 MED ORDER — FAMOTIDINE 20 MG PO TABS: 20.0000 mg | ORAL_TABLET | Freq: Two times a day (BID) | ORAL | 0 refills | Status: DC

## 2021-05-31 NOTE — Discharge Instructions (Addendum)
As discussed, today's evaluation has been reassuring.  However, with her ongoing abdominal pain, some suspicion for gastroesophageal irritation and is importantly follow-up with a gastroenterologist.  If you are unable to follow-up with our gastroenterology, please use the contact information provided above to arrange follow-up.  Return here for concerning changes in your condition.

## 2021-05-31 NOTE — Telephone Encounter (Signed)
Patient calls with vomiting and abdominal pain for two weeks and has been seen at the ED twice, including yesterday. He was given a prescription for pepcid, carafate and zofran to start taking.Referral to GI has been placed and he has talked with several offices, [facility] having the earliest appointment available on 06/25/21. Pain is from his throat to the bottom of the stomach and back. He is burping and having a sour awful taste with those. Occasionally sees bright red blood and dark blood in his emesis. Tolerating only crackers and attempts to keep water and tea down. Voids once daily and no recent bowel movement. ED visits are in epic.  He is requesting pcp to attempt to get an earlier GI appointment and is requesting medication for the pain stating he will find pain medication if not provided.  Informed patient I would make sure pcp gets this note on Monday. Patient hung up after words.    Answer Assessment - Initial Assessment Questions 1. LOCATION: "Where does it hurt?"      From his throat to the bottom of stomach 2. RADIATION: "Does the pain shoot anywhere else?" (e.g., chest, back)     To the back 3. ONSET: "When did the pain begin?" (Minutes, hours or days ago)      At least 2 weeks ago 4. SUDDEN: "Gradual or sudden onset?"     sudden 5. PATTERN "Does the pain come and go, or is it constant?"    - If constant: "Is it getting better, staying the same, or worsening?"      (Note: Constant means the pain never goes away completely; most serious pain is constant and it progresses)     - If intermittent: "How long does it last?" "Do you have pain now?"     (Note: Intermittent means the pain goes away completely between bouts)     constant 6. SEVERITY: "How bad is the pain?"  (e.g., Scale 1-10; mild, moderate, or severe)    - MILD (1-3): doesn't interfere with normal activities, abdomen soft and not tender to touch     - MODERATE (4-7): interferes with normal activities or awakens from  sleep, abdomen tender to touch     - SEVERE (8-10): excruciating pain, doubled over, unable to do any normal activities       Says he cannot live with this pain until his GI appointment 7. RECURRENT SYMPTOM: "Have you ever had this type of stomach pain before?" If Yes, ask: "When was the last time?" and "What happened that time?"      no 8. CAUSE: "What do you think is causing the stomach pain?"     Sour awful taste with burping. ?reflux 9. RELIEVING/AGGRAVATING FACTORS: "What makes it better or worse?" (e.g., movement, antacids, bowel movement)   nothing 10. OTHER SYMPTOMS: "Do you have any other symptoms?" (e.g., back pain, diarrhea, fever, urination pain, vomiting)       Back pain, vomiting  Protocols used: Abdominal Pain - Male-A-AH

## 2021-05-31 NOTE — ED Provider Notes (Signed)
Spencer Mendoza Provider Note   CSN: [id number] Arrival date & time: 05/31/21  1024     History Chief Complaint  Patient presents with   Abdominal Pain    Spencer Mendoza is a 60 y.o. male.  HPI Patient presents with abdominal pain.  Pain is upper, with associated nausea, vomiting, p.o. intolerance.  Symptoms been present for about 2 weeks he has been seen and evaluated twice during that period.  No relief with anything, including pantoprazole He is scheduled to follow-up with GI next month.  During his 2 prior visits he has had imaging studies, labs, notes that he continues to have essentially the same pain in the upper abdomen, severe, sharp, nonradiating.     Past Medical History:  Diagnosis Date   Alcohol withdrawal ([place]) 07/29/2013   Allergy    Anxiety    Benzodiazepine abuse ([place]) 08/30/2019   Bipolar disorder ([place])    Chronic insomnia 06/06/2015   Chronic migraine without aura, with intractable migraine, so stated, with status migrainosus 08/25/2019   Claustrophobia    Cocaine abuse ([place]) 05/22/2015   Delirium tremens ([place]) 07/29/2013   Depression    Epilepsy ([place])    last seizure week of 06-25-2020- sev. grand mal seizures per pt    Hemiplegic migraine with status migrainosus 06/06/2015   Hypertension    Migraine    Opioid use disorder 08/30/2019   PONV (postoperative nausea and vomiting)    Stroke [facility])    patient denies- states had a hemiplegic migraine in ~2011   Substance abuse [facility])     Patient Active Problem List   Diagnosis Date Noted   Exanthem due to herpes zoster 04/01/2021   Abdominal aortic atherosclerosis ([place]) with 50% occlusion distal aorta 03/05/2020   CAD (coronary artery disease) 03/05/2020   Osteoarthritis of left wrist 01/04/2020   Liver hemangioma 09/21/2019   Tobacco use 08/30/2019   COPD with chronic bronchitis ([place]) 08/30/2019   Chronic migraine without aura, with intractable migraine, so stated, with  status migrainosus 08/25/2019   MDD (major depressive disorder), recurrent severe, without psychosis ([place]) 11/18/2018   Hemiplegic migraine with status migrainosus 06/06/2015   Chronic insomnia 06/06/2015   Seizures ([place]) 01/09/2014   Chronic low back pain 12/28/2013   History of stroke 12/04/2013   Hypertension 12/04/2013    Past Surgical History:  Procedure Laterality Date   ANKLE SURGERY     in high school   APPENDECTOMY     COLONOSCOPY     KNEE ARTHROSCOPY Right    NOSE SURGERY         Family History  Problem Relation Age of Onset   Heart disease Mother    Stroke Mother    Epilepsy Mother    Heart failure Mother    Cancer Father    Heart disease Father    Hypertension Father    Melanoma Father    Migraines Father    Colon cancer Sister 65   Colon cancer Cousin    Cancer Cousin    Colon polyps Neg Hx    Esophageal cancer Neg Hx    Rectal cancer Neg Hx    Stomach cancer Neg Hx     Social History   Tobacco Use   Smoking status: Every Day    Years: 15.00    Types: Cigarettes   Smokeless tobacco: Never   Tobacco comments:    Reports 2 cigarettes daily  Vaping Use   Vaping Use: Never used  Substance  Use Topics   Alcohol use: Not Currently    Alcohol/week: 6.0 standard drinks    Types: 6 Cans of beer per week    Comment: none since Sept 2020   Drug use: Not Currently    Types: Marijuana    Comment: hx of crack, heroin; only THC since Sept 2020    Home Medications Prior to Admission medications   Medication Sig Start Date End Date Taking? Authorizing Provider  albuterol (VENTOLIN HFA) 108 (90 Base) MCG/ACT inhaler INHALE 2 PUFFS BY MOUTH INTO THE LUNGS EVERY 4 HOURS AS NEEDED FOR SHORTNESS OF BREATH OR WHEEZING 03/19/21   Elsie Stain, MD  ALPRAZolam Duanne Moron) 0.25 MG tablet Take 1 tablet (0.25 mg total) by mouth 3 (three) times daily as needed for anxiety. 03/19/21   Elsie Stain, MD  aspirin EC 81 MG tablet Take 1 tablet (81 mg total) by mouth  daily. Swallow whole. 07/17/20   Jerline Pain, MD  budesonide-formoterol (SYMBICORT) 160-4.5 MCG/ACT inhaler Inhale 2 puffs into the lungs 2 (two) times daily. 03/19/21   Elsie Stain, MD  busPIRone (BUSPAR) 7.5 MG tablet TAKE 1 TABLET(7.5 MG) BY MOUTH DAILY 05/17/21   Elsie Stain, MD  calamine lotion Apply 1 application topically as needed for itching. Apply three times daily as needed to left thigh rash 04/01/21   Elsie Stain, MD  dexamethasone (DECADRON) 2 MG tablet Keep on hand if needed for acute headache, take 3, then take 2, then take 1 02/25/21   Suzzanne Cloud, NP  folic acid (FOLVITE) 1 MG tablet Take 1 tablet (1 mg total) by mouth daily. 03/19/21   Elsie Stain, MD  levETIRAcetam (KEPPRA) 1000 MG tablet Take 1 tablet (1,000 mg total) by mouth 2 (two) times daily. 02/25/21   Suzzanne Cloud, NP  ondansetron (ZOFRAN ODT) 4 MG disintegrating tablet Dissolve 1 tablet every 4 hours as needed for nausea/vomit 05/23/21   Milton Ferguson, MD  oxyCODONE-acetaminophen (PERCOCET) 5-325 MG tablet Take 1 tablet by mouth every 6 (six) hours as needed. 05/21/21   Milton Ferguson, MD  pantoprazole (PROTONIX) 20 MG tablet Take 1 tablet (20 mg total) by mouth daily. 05/23/21   Milton Ferguson, MD  predniSONE (DELTASONE) 10 MG tablet Take 4 tablets daily for 5 days then stop 04/01/21   Elsie Stain, MD  rosuvastatin (CRESTOR) 10 MG tablet Take 1 tablet (10 mg total) by mouth daily. 03/19/21   Elsie Stain, MD  thiamine 100 MG tablet Take 1 tablet (100 mg total) by mouth daily. 03/19/21   Elsie Stain, MD  traZODone (DESYREL) 100 MG tablet TAKE 2 TABLETS(200 MG) BY MOUTH AT BEDTIME 03/19/21   Elsie Stain, MD    Allergies    Sertraline, Codeine, Depakote [divalproex sodium], Migranal [dihydroergotamine], and Topamax [topiramate]  Review of Systems   Review of Systems  Constitutional:        Per HPI, otherwise negative  HENT:         Per HPI, otherwise negative  Respiratory:          Per HPI, otherwise negative  Cardiovascular:        Per HPI, otherwise negative  Gastrointestinal:  Positive for abdominal pain, nausea and vomiting.  Endocrine:       Negative aside from HPI  Genitourinary:        Neg aside from HPI   Musculoskeletal:        Per HPI, otherwise negative  Skin:  Negative.   Neurological:  Negative for syncope.   Physical Exam Updated Vital Signs BP (!) 148/73   Pulse 70   Temp 98 F (36.7 C) (Oral)   Resp 18   SpO2 98%   Physical Exam Vitals and nursing note reviewed.  Constitutional:      Appearance: He is well-developed.     Comments: Uncomfortable appearing thin adult male  HENT:     Head: Normocephalic and atraumatic.  Eyes:     Conjunctiva/sclera: Conjunctivae normal.  Cardiovascular:     Rate and Rhythm: Normal rate and regular rhythm.  Pulmonary:     Effort: Pulmonary effort is normal. No respiratory distress.     Breath sounds: No stridor.  Abdominal:     General: There is no distension.     Tenderness: There is abdominal tenderness in the epigastric area. There is guarding.  Skin:    General: Skin is warm and dry.  Neurological:     Mental Status: He is alert and oriented to person, place, and time.    ED Results / Procedures / Treatments   Labs (all labs ordered are listed, but only abnormal results are displayed) Labs Reviewed  ACETAMINOPHEN LEVEL - Abnormal; Notable for the following components:      Result Value   Acetaminophen (Tylenol), Serum <10 (*)    All other components within normal limits  COMPREHENSIVE METABOLIC PANEL - Abnormal; Notable for the following components:   Glucose, Bld 108 (*)    All other components within normal limits  ETHANOL  LIPASE, BLOOD  CBC WITH DIFFERENTIAL/PLATELET    EKG None  Radiology US Abdomen Complete  Result Date: 05/31/2021 CLINICAL DATA:  Abdominal pain with nausea vomiting. EXAM: ABDOMEN ULTRASOUND COMPLETE COMPARISON:  CT scan 05/21/2021 FINDINGS: Gallbladder: No  gallstones or wall thickening visualized. No sonographic Murphy sign noted by sonographer. Common bile duct: Diameter: 4 mm Liver: Mild coarsening of hepatic echotexture. No focal abnormality. Portal vein is patent on color Doppler imaging with normal direction of blood flow towards the liver. IVC: No abnormality visualized. Pancreas: Largely obscured by bowel gas. Spleen: Size and appearance within normal limits. Right Kidney: Length: 9.6 cm. Echogenicity within normal limits. No mass or hydronephrosis visualized. Left Kidney: Length: 10.5 cm. Echogenicity within normal limits. No mass or hydronephrosis visualized. Abdominal aorta: No aneurysm visualized. Other findings: None. IMPRESSION: No acute abnormality. No findings to explain the patient's history of abdominal pain. Electronically Signed   By: Misty Stanley M.D.   On: 05/31/2021 13:38    Procedures Procedures   Medications Ordered in ED Medications  sodium chloride 0.9 % bolus 1,000 mL (1,000 mLs Intravenous New Bag/Given 05/31/21 1219)  LORazepam (ATIVAN) injection 1 mg (1 mg Intravenous Given 05/31/21 1220)    ED Course  I have reviewed the triage vital signs and the nursing notes.  Pertinent labs & imaging results that were available during my care of the patient were reviewed by me and considered in my medical decision making (see chart for details).  Chart review notable for 2 visits in the past 10 days, including CT angiography results as below: IMPRESSION: VASCULAR   No significant mesenteric vascular abnormality identified.   NON-VASCULAR   No acute abnormality of the abdomen or pelvis. 2:55 PM Patient sleeping.  Has not commented by male companion.  Together we will discussed the patient's evaluation today, and recent studies.  Ultrasound today reassuring, labs unremarkable, no evidence for pancreatitis, no evidence for acute cholecystitis, no pneumoperitoneum, no  history of bacteremia, sepsis, the patient is seemingly  improved as he is sleeping. With reassuring CT scans, x-rays, and today's ultrasound, no evidence for intra-abdominal acute findings, patient is appropriate for ongoing follow-up with gastroenterology. MDM Rules/Calculators/A&P MDM Number of Diagnoses or Management Options Epigastric pain: established, worsening   Amount and/or Complexity of Data Reviewed Clinical lab tests: reviewed and ordered Tests in the radiology section of CPT: ordered and reviewed Tests in the medicine section of CPT: reviewed and ordered Decide to obtain previous medical records or to obtain history from someone other than the patient: yes Review and summarize past medical records: yes Independent visualization of images, tracings, or specimens: yes  Risk of Complications, Morbidity, and/or Mortality Presenting problems: high Diagnostic procedures: high Management options: high  Critical Care Total time providing critical care: < 30 minutes  Patient Progress Patient progress: stable   Final Clinical Impression(s) / ED Diagnoses Final diagnoses:  Epigastric pain    Rx / DC Orders ED Discharge Orders          Ordered    sucralfate (CARAFATE) 1 g tablet  3 times daily with meals & bedtime       Note to Pharmacy: Take for one week   05/31/21 1501    ondansetron (ZOFRAN ODT) 4 MG disintegrating tablet        05/31/21 1501    famotidine (PEPCID) 20 MG tablet  2 times daily        05/31/21 1502             Carmin Muskrat, MD 05/31/21 1502

## 2021-05-31 NOTE — ED Triage Notes (Signed)
Patient arrived reporting he has been seen here twice recently for same.   C/o epigastric pain and mid abdominal pain for about 2 weeks. C/o N/V   A/Ox4 Ambulatory in triage   Reports he has had blood work and a scan.   Last BM 5 days ago.   Reports unable to be seen by GI until 8/9

## 2021-06-03 ENCOUNTER — Telehealth: Payer: Self-pay | Admitting: Critical Care Medicine

## 2021-06-03 MED ORDER — ONDANSETRON 4 MG PO TBDP: ORAL_TABLET | ORAL | 0 refills | Status: DC

## 2021-06-03 MED ORDER — SUCRALFATE 1 G PO TABS: 1.0000 g | ORAL_TABLET | Freq: Three times a day (TID) | ORAL | 0 refills | Status: DC

## 2021-06-03 MED ORDER — FLUCONAZOLE 100 MG PO TABS: 100.0000 mg | ORAL_TABLET | Freq: Every day | ORAL | 0 refills | Status: AC

## 2021-06-03 MED ORDER — FAMOTIDINE 20 MG PO TABS: 20.0000 mg | ORAL_TABLET | Freq: Two times a day (BID) | ORAL | 0 refills | Status: DC

## 2021-06-03 NOTE — Telephone Encounter (Signed)
Not able to open other telephone visit from [facility]  I called the patient,  after ED visit 7/15 he is better .  I will refill his medications and encouraged him to keep his appts with GI in early August.  I will send rx for diflucan incase he has candidal esophagitis

## 2021-06-16 ENCOUNTER — Other Ambulatory Visit: Payer: Self-pay | Admitting: Critical Care Medicine

## 2021-06-16 NOTE — Telephone Encounter (Signed)
Requested Prescriptions  Pending Prescriptions Disp Refills  . traZODone (DESYREL) 100 MG tablet [Pharmacy Med Name: TRAZODONE '100MG'$  TABLETS] 60 tablet 2    Sig: TAKE 2 TABLETS(200 MG) BY MOUTH AT BEDTIME     Psychiatry: Antidepressants - Serotonin Modulator Passed - 06/16/2021  6:37 AM      Passed - Completed PHQ-2 or PHQ-9 in the last 360 days      Passed - Valid encounter within last 6 months    Recent Outpatient Visits          2 months ago Exanthem due to herpes zoster   [place] Elsie Stain, MD   2 months ago MDD (major depressive disorder), recurrent severe, without psychosis ([place])   [facility] Elsie Stain, MD   8 months ago COPD with chronic bronchitis [facility])   [place] Elsie Stain, MD   11 months ago Essential hypertension   [place] Elsie Stain, MD   1 year ago Coronary artery disease of native artery of native heart with stable angina pectoris [facility])   [facility] Elsie Stain, MD

## 2021-06-25 ENCOUNTER — Ambulatory Visit (INDEPENDENT_AMBULATORY_CARE_PROVIDER_SITE_OTHER): Payer: Medicare Other | Admitting: Nurse Practitioner

## 2021-06-25 ENCOUNTER — Other Ambulatory Visit: Payer: Self-pay | Admitting: Nurse Practitioner

## 2021-06-25 ENCOUNTER — Encounter: Payer: Self-pay | Admitting: Nurse Practitioner

## 2021-06-25 VITALS — BP 104/62 | HR 59 | Ht 72.0 in | Wt 156.0 lb

## 2021-06-25 DIAGNOSIS — K920 Hematemesis: Secondary | ICD-10-CM | POA: Diagnosis not present

## 2021-06-25 DIAGNOSIS — R1310 Dysphagia, unspecified: Secondary | ICD-10-CM

## 2021-06-25 DIAGNOSIS — R634 Abnormal weight loss: Secondary | ICD-10-CM

## 2021-06-25 DIAGNOSIS — R1012 Left upper quadrant pain: Secondary | ICD-10-CM | POA: Diagnosis not present

## 2021-06-25 MED ORDER — PANTOPRAZOLE SODIUM 40 MG PO TBEC: 40.0000 mg | DELAYED_RELEASE_TABLET | Freq: Every day | ORAL | 1 refills | Status: DC

## 2021-06-25 MED ORDER — FAMOTIDINE 20 MG PO TABS: 20.0000 mg | ORAL_TABLET | Freq: Every day | ORAL | 1 refills | Status: DC

## 2021-06-25 NOTE — Patient Instructions (Addendum)
If you are age 62 or younger, your body mass index should be between 19-25. Your Body mass index is 21.16 kg/m. If this is out of the aformentioned range listed, please consider follow up with your Primary Care Provider.   The [facility] providers would like to encourage you to use [facility] to communicate with providers for non-urgent requests or questions.  Due to long hold times on the telephone, sending your provider a message by [facility] may be faster and more efficient way to get a response. Please allow 48 business hours for a response.  Please remember that this is for non-urgent requests/questions.  PROCEDURES: You have been scheduled for a EGD. Please follow the written instructions given to you at your visit today. If you use inhalers (even only as needed), please bring them with you on the day of your procedure.  MEDICATION: We have sent the following medication to your pharmacy for you to pick up at your convenience: Famotidine 20 MG at bedtime. Pantoprazole 40 MG once a day.  RECOMMENDATIONS: Stop BC powder.  Please Avoid all NSAID's. Some examples of NSAID's are as follows: Aspirin (Bufferin, Bayer, and Excedrin) Ibuprofen (Advil, Motrin, Nuprin) Ketoprofen (Actron, Orudis) Naproxen (Aleve) Daypro  Indocin  Lodine  Naprosyn  Relafen  Vimovo Voltaren  It was great seeing you today! Thank you for entrusting me with your care and choosing [facility].  Noralyn Pick, CRNP

## 2021-06-25 NOTE — Progress Notes (Signed)
06/25/2021 Spencer Mendoza [id number] Jul 09, 1961   CHIEF COMPLAINT: Abdominal pain   HISTORY OF PRESENT ILLNESS:  Jakson Duchateau. Mastandrea is a 60 year old male with a past medical history of anxiety, depression, bipolar disorder, alcohol abuse disorder abstinent x 1 1/2 to 2 yeas, past cocaine and opioid abuse, current marijuana use, tobacco use, hypertension, nonobstructive CAD, peripheral vascular disease with stenosis of inferior mesenteric artery bilateral iliac artery and femoral artery moderate stenosis, migraine headaches, epilepsy last seizure activity was 6 to 12 months ago and colon polyps.  He presents to our office today as referred by Dr. Asencion Noble for further evaluation for N/V and upper abdominal pain.  He is accompanied by his fiance.  He presented to [facility] ED on 05/21/2021 with chest pain upper abdominal pain and vomiting which started on 6/30. Labs showed K+ 3.2. T. Bili 1.7. WBC 11.2. Lactic acid 2.5. UDS + for THC. EKG showed poor R wave progression V1 - V3 with Q waves V1 and V2 concerning for a past septal infarct which has been present since at least 2016 when compared to past EKGs.  No acute ischemic changes.  Chest xray was negative. An abd/pelvic CTA was negative. He received IV fluids, KCL po, Fentanyl and Reglan and his symptoms improved. He was discharged home on Pantoprazole '20mg'$  QD, Oxycodone 5/'325mg'$  1 tab po Q 6hrs and Ondansetron '4mg'$  ODT Q 6 hrs PRN.    He went back to the ED on 05/23/2021 with persistent abdominal pain and vomiting.  He was unable to fill his Protonix, oxycodone and Zofran prescriptions due to insurance issues.  He reported vomiting coffee ground emesis.  His K+ level was 2.8 and he received KCl replacement.  He stood up to urinate while he was in the ED and reported having a syncopal episode without seizure activity or recurrence. He received IV fluids, Pepcid IV, Pantoprazole IV, Phenergan, Zofran, Morphine and Dilaudid.  His vomiting subsided and  he was discharged home.  He presented back to [facility] ED on 05/31/2021 with persistent nausea/vomiting and upper abdominal pain.  CBC and CMP were normal.  A RUQ sonogram showed a normal gallbladder and common bile duct with mild coarsening of the hepatic echotexture otherwise was negative.  He was prescribed Carafate 1 g 3 times daily, Famotidine 20 mg twice daily and Zofran 4 mg ODT every 6 hours as needed and to proceed with his GI consult as scheduled today.   He complains of having severe esophageal and epigastric pain for the past 3 months.  He describes having severe burning in his esophagus which he previously reported as chest pain.  Over the past 3 to 4 weeks he has vomited on numerous occasions, he describes his emesis as coffee-ground appearing and black and tarry at times.  He also complains of dysphagia, food gets stuck to his mid esophagus and eventually passes down slowly after drinking water which is occurred for the past few months.  He is passing normal formed brown bowel movements most days.  No melena or rectal bleeding.  He denies ever having an EGD.  He underwent a colonoscopy by Dr. Ardis Hughs 07/27/2020 which identified a 7 mm and 14 mm tubular adenomatous polyp which were removed from the colon.  He was advised to repeat a colonoscopy 07/2023.  Sister was diagnosed with colon cancer the age of 6.  He has lost 30 pounds in the past 3 months.  No fever, sweats or chills.  He reported taking 2 to 3 BC powder  packets daily for the past 20 years for generalized aches and pains.  Over the past month,  he reduced taking BC powder to once daily.  No takes ASA '81mg'$  QD.  He remains abstinent from alcohol for 1-1/2 to 2 years.  He smokes marijuana on a regular basis.  He continued the Carafate prescription provided by the ED.  He stopped taking pantoprazole when he was prescribed Famotidine 20 mg p.o. twice daily a few weeks ago.  He stated he was diagnosed with shingles about 3 weeks ago  and was prescribed Valtrex 04/01/2021 and Prednisone. He was prescribed Diflucan for 7 days by Dr. Joya Gaskins for possible candidiasis esophagitis 06/03/2021. His dysphagia symptoms improved somewhat after he took the Diflucan.  He smokes cigarettes 1 pack/day for 45 years and recently reduced smoking to 1/2 pack/day.   Colonoscopy 07/27/2020 by Dr. Ardis Hughs: - One 7 mm polyp in the descending colon, removed with a cold snare. Resected and retrieved. - One 14 mm polyp in the sigmoid colon, removed with a hot snare. Resected and retrieved. - The examination was otherwise normal on direct and retroflexion views. - 3 year colonoscopy recall 1. Surgical [P], colon, descending, polyp - TUBULAR ADENOMA. - NO HIGH GRADE DYSPLASIA OR CARCINOMA. 2. Surgical [P], colon, sigmoid, polyp - TUBULAR ADENOMA. - STALK MARGIN NOT INVOLVED BY ADENOMA. - NO HIGH GRADE DYPLASIA OR CARCINOMA  CBC Latest Ref Rng & Units 05/31/2021 05/23/2021 05/21/2021  WBC 4.0 - 10.5 K/uL 8.5 8.6 -  Hemoglobin 13.0 - 17.0 g/dL 15.6 14.9 16.3  Hematocrit 39.0 - 52.0 % 44.5 42.4 48.0  Platelets 150 - 400 K/uL 283 202 -    CMP Latest Ref Rng & Units 05/31/2021 05/23/2021 05/21/2021  Glucose 70 - 99 mg/dL 108(H) 143(H) 109(H)  BUN 8 - 23 mg/dL 8 7(L) 7(L)  Creatinine 0.61 - 1.24 mg/dL 1.02 0.88 1.00  Sodium 135 - 145 mmol/L 136 136 137  Potassium 3.5 - 5.1 mmol/L 3.9 2.8(L) 3.3(L)  Chloride 98 - 111 mmol/L 101 99 100  CO2 22 - 32 mmol/L 26 26 -  Calcium 8.9 - 10.3 mg/dL 10.0 9.4 -  Total Protein 6.5 - 8.1 g/dL 7.3 6.9 -  Total Bilirubin 0.3 - 1.2 mg/dL 0.9 1.3(H) -  Alkaline Phos 38 - 126 U/L 56 60 -  AST 15 - 41 U/L 20 26 -  ALT 0 - 44 U/L 14 21 -     RUQ sonogram 05/31/2021: FINDINGS: Gallbladder: No gallstones or wall thickening visualized. No sonographic Murphy sign noted by sonographer.   Common bile duct: Diameter: 4 mm   Liver: Mild coarsening of hepatic echotexture. No focal abnormality. Portal vein is patent on color  Doppler imaging with normal direction of blood flow towards the liver.   IVC: No abnormality visualized.   Pancreas: Largely obscured by bowel gas.   Spleen: Size and appearance within normal limits.   Right Kidney: Length: 9.6 cm. Echogenicity within normal limits. No mass or hydronephrosis visualized.   Left Kidney: Length: 10.5 cm. Echogenicity within normal limits. No mass or hydronephrosis visualized.   Abdominal aorta: No aneurysm visualized.   Other findings: None.   IMPRESSION: No acute abnormality. No findings to explain the patient's history of abdominal pain.   Abdominal/Pelvic CT angiogram 05/21/2021: IMPRESSION: VASCULAR   No significant mesenteric vascular abnormality identified.   NON-VASCULAR   No acute abnormality of the abdomen or pelvis.    Past Medical  History:  Diagnosis Date   Alcohol withdrawal ([place]) 07/29/2013   Allergy    Anxiety    Benzodiazepine abuse ([place]) 08/30/2019   Bipolar disorder ([place])    Chronic insomnia 06/06/2015   Chronic migraine without aura, with intractable migraine, so stated, with status migrainosus 08/25/2019   Claustrophobia    Cocaine abuse ([place]) 05/22/2015   Delirium tremens ([employer]) 07/29/2013   Depression    Epilepsy ([place])    last seizure week of 06-25-2020- sev. grand mal seizures per pt    Hemiplegic migraine with status migrainosus 06/06/2015   Hypertension    Migraine    Opioid use disorder 08/30/2019   PONV (postoperative nausea and vomiting)    Stroke [facility])    patient denies- states had a hemiplegic migraine in ~2011   Substance abuse ([place])     Past Surgical History:  Procedure Laterality Date   ANKLE SURGERY     in high school   APPENDECTOMY     COLONOSCOPY     KNEE ARTHROSCOPY Right    NOSE SURGERY    Rhe reports having nausea/vomiting after anesthesia.  No problems with airway management/intubation with any past procedure or surgery.  Social History:  reports that he has been smoking cigarettes. He has  never used smokeless tobacco. He reports previous alcohol use of about 6.0 standard drinks of alcohol per week. He reports previous drug use. Drug: Marijuana. See HPI.   Family History: family history includes Cancer in his cousin and father; Colon cancer in his cousin; Colon cancer (age of onset: 55) in his sister; Epilepsy in his mother; Heart disease in his father and mother; Heart failure in his mother; Hypertension in his father; Melanoma in his father; Migraines in his father; Stroke in his mother.  Allergies  Allergen Reactions   Sertraline Other (See Comments)    Erectile dysfunction   Codeine Nausea And Vomiting   Depakote [Divalproex Sodium] Other (See Comments)    Made patient shake   Migranal [Dihydroergotamine] Nausea And Vomiting   Topamax [Topiramate] Other (See Comments)    Made patient angry      Outpatient Encounter Medications as of 06/25/2021  Medication Sig   albuterol (VENTOLIN HFA) 108 (90 Base) MCG/ACT inhaler INHALE 2 PUFFS BY MOUTH INTO THE LUNGS EVERY 4 HOURS AS NEEDED FOR SHORTNESS OF BREATH OR WHEEZING   ALPRAZolam (XANAX) 0.25 MG tablet Take 1 tablet (0.25 mg total) by mouth 3 (three) times daily as needed for anxiety.   aspirin EC 81 MG tablet Take 1 tablet (81 mg total) by mouth daily. Swallow whole.   budesonide-formoterol (SYMBICORT) 160-4.5 MCG/ACT inhaler Inhale 2 puffs into the lungs 2 (two) times daily.   busPIRone (BUSPAR) 7.5 MG tablet TAKE 1 TABLET(7.5 MG) BY MOUTH DAILY   calamine lotion Apply 1 application topically as needed for itching. Apply three times daily as needed to left thigh rash   famotidine (PEPCID) 20 MG tablet Take 1 tablet (20 mg total) by mouth 2 (two) times daily. Take one tablet twice daily   folic acid (FOLVITE) 1 MG tablet Take 1 tablet (1 mg total) by mouth daily.   levETIRAcetam (KEPPRA) 1000 MG tablet Take 1 tablet (1,000 mg total) by mouth 2 (two) times daily.   ondansetron (ZOFRAN ODT) 4 MG disintegrating tablet Dissolve 1  tablet every 4 hours as needed for nausea/vomit   predniSONE (DELTASONE) 10 MG tablet Take 4 tablets daily for 5 days then stop   rosuvastatin (CRESTOR) 10 MG tablet Take  1 tablet (10 mg total) by mouth daily.   sucralfate (CARAFATE) 1 g tablet Take 1 tablet (1 g total) by mouth 4 (four) times daily -  with meals and at bedtime.   thiamine 100 MG tablet Take 1 tablet (100 mg total) by mouth daily.   traZODone (DESYREL) 100 MG tablet TAKE 2 TABLETS(200 MG) BY MOUTH AT BEDTIME   No facility-administered encounter medications on file as of 06/25/2021.    REVIEW OF SYSTEMS:  Gen: D+ Fatigue and weight loss. Denies fever, sweats or chills. CV: Denies chest pain, palpitations or edema. Resp: + Cough, SOB and sore throat. No hemoptysis.  GI: Denies heartburn, dysphagia, stomach or lower abdominal pain. No diarrhea or constipation.  GU : Denies urinary burning, blood in urine, increased urinary frequency or incontinence. MS: + Arthritis and back pain. Derm: Denies rash, itchiness, skin lesions or unhealing ulcers. Psych: + Anxiety.  Heme: Denies bruising, bleeding. Neuro:  Denies headaches, dizziness or paresthesias. Endo:  Denies any problems with DM, thyroid or adrenal function.  PHYSICAL EXAM: BP 104/62   Pulse (!) 59   Ht 6' (1.829 m)   Wt 156 lb (70.8 kg)   BMI 21.16 kg/m   General: Ill-appearing thin 60 year old male in no acute distress. Head: Normocephalic and atraumatic. Eyes:  Sclerae non-icteric, conjunctive pink. Ears: Normal auditory acuity. Mouth: Dentition intact.  Posterior tongue with white plaques most likely candidiasis.  Neck: Supple, no lymphadenopathy or thyromegaly.  Lungs: Clear bilaterally to auscultation without wheezes, crackles or rhonchi. Heart: Regular rate and rhythm. No murmur, rub or gallop appreciated.  Abdomen: Soft, nondistended. Epigastric tenderness. No masses. No hepatosplenomegaly. Normoactive bowel sounds x 4 quadrants.  Rectal: Deferred.   Musculoskeletal: Symmetrical with no gross deformities. Skin: Warm and dry. No rash or lesions on visible extremities.  Multiple tattoos. Extremities: No edema. Neurological: Alert oriented x 4, no focal deficits.  Psychological:  Alert and cooperative. Normal mood and affect.  ASSESSMENT AND PLAN:  60 year old male with esophageal and epigastric burning pain, hematemesis, dysphagia and 30lb weight loss in setting of chronic tobacco and NSAID use. Abd/pelvic CT angiogram was unrevealing. RUQ mild coarsening of the hepatic echotexture otherwise was negative. -EGD to rule out reflux vs candidiasis esophagitis/PUD/upper GI malignancy. EGD benefits and risks discussed including risk with sedation, risk of bleeding, perforation and infection. EGD scheduled with Dr. Ardis Hughs 06/26/2021 -Pantoprazole '40mg'$  QD. Famotidine '20mg'$  Q HS -Ondasetron '4mg'$  ODT Q 6 hrs PRN -Recommended smoking cessation  -Stop BC powder use.  No NSAIDs. -Further recommendations to be determined after EGD completed   History of tubular adenomatous polyps per colonoscopy 07/2020.  Positive family history (sister) for colon cancer. -Next colonoscopy due 07/2023  Nonobstructive CAD  Peripheral vascular disease with stenosis of inferior mesenteric artery bilateral iliac artery and femoral artery moderate stenosis  History of polysubstance abuse.  Abstinent from alcohol for 1-1/2 to 2 years.  Current marijuana use.   History of epilepsy since childhood, stable.  Patient denies any seizure activity for 6 to 12 months.       CC:  Elsie Stain, MD

## 2021-06-25 NOTE — Progress Notes (Signed)
I agree with the above note, plan 

## 2021-06-26 ENCOUNTER — Other Ambulatory Visit: Payer: Self-pay

## 2021-06-26 ENCOUNTER — Encounter: Payer: Self-pay | Admitting: Gastroenterology

## 2021-06-26 ENCOUNTER — Ambulatory Visit (AMBULATORY_SURGERY_CENTER): Payer: Medicare Other | Admitting: Gastroenterology

## 2021-06-26 VITALS — BP 97/63 | HR 44 | Temp 98.3°F | Resp 20 | Ht 72.0 in | Wt 156.0 lb

## 2021-06-26 DIAGNOSIS — K2980 Duodenitis without bleeding: Secondary | ICD-10-CM | POA: Diagnosis not present

## 2021-06-26 DIAGNOSIS — R1310 Dysphagia, unspecified: Secondary | ICD-10-CM | POA: Diagnosis not present

## 2021-06-26 DIAGNOSIS — K2970 Gastritis, unspecified, without bleeding: Secondary | ICD-10-CM

## 2021-06-26 DIAGNOSIS — K319 Disease of stomach and duodenum, unspecified: Secondary | ICD-10-CM | POA: Diagnosis not present

## 2021-06-26 MED ORDER — SODIUM CHLORIDE 0.9 % IV SOLN
500.0000 mL | Freq: Once | INTRAVENOUS | Status: DC
Start: 2021-06-26 — End: 2021-06-26

## 2021-06-26 NOTE — Progress Notes (Signed)
VS taken by C.W. 

## 2021-06-26 NOTE — Progress Notes (Signed)
Called to room to assist during endoscopic procedure.  Patient ID and intended procedure confirmed with present staff. Received instructions for my participation in the procedure from the performing physician.  

## 2021-06-26 NOTE — Op Note (Signed)
[facility] Patient Name: Spencer Mendoza Procedure Date: 06/26/2021 2:05 PM MRN: [id number] Endoscopist: Milus Banister , MD Age: 60 Referring MD:  Date of Birth: 07-07-1961 Gender: Male Account #: [id number] Procedure:                Upper GI endoscopy Indications:              Dysphagia, nausea, vomiting, weight loss Medicines:                Monitored Anesthesia Care Procedure:                Pre-Anesthesia Assessment:                           - Prior to the procedure, a History and Physical                            was performed, and patient medications and                            allergies were reviewed. The patient's tolerance of                            previous anesthesia was also reviewed. The risks                            and benefits of the procedure and the sedation                            options and risks were discussed with the patient.                            All questions were answered, and informed consent                            was obtained. Prior Anticoagulants: The patient has                            taken no previous anticoagulant or antiplatelet                            agents. ASA Grade Assessment: III - A patient with                            severe systemic disease. After reviewing the risks                            and benefits, the patient was deemed in                            satisfactory condition to undergo the procedure.                           After obtaining informed consent, the endoscope was  passed under direct vision. Throughout the                            procedure, the patient's blood pressure, pulse, and                            oxygen saturations were monitored continuously. The                            GIF [id number] was introduced through the                            mouth, and advanced to the second part of duodenum.                            The upper GI  endoscopy was accomplished without                            difficulty. The patient tolerated the procedure                            well. Scope In: Scope Out: Findings:                 Moderate inflammation characterized by erythema,                            friability and granularity was found in the entire                            examined stomach. Biopsies were taken with a cold                            forceps for histology.                           Mild, non-specific bulbar duodenitis with spasm.                           The exam was otherwise without abnormality. Complications:            No immediate complications. Estimated blood loss:                            None. Estimated Blood Loss:     Estimated blood loss: none. Impression:               - Moderate gastritis, biopsied to check for H.                            pylori.                           - Non-specific duodenitis.                           - The examination was otherwise normal. Recommendation:           -  Await pathology results. If these show H. pylori                            infection you will be started on appropriate                            antibiotics.                           - Patient has a contact number available for                            emergencies. The signs and symptoms of potential                            delayed complications were discussed with the                            patient. Return to normal activities tomorrow.                            Written discharge instructions were provided to the                            patient.                           - Resume previous diet.                           - You need to completely stop NSAID and aspirin                            products (BCs, Goodys, ibuprofen, alleve, etc).                           - Stay on pantoprazole '40mg'$  every AM and famotidine                            '20mg'$  at bedtime. Milus Banister,  MD 06/26/2021 2:32:10 PM This report has been signed electronically.

## 2021-06-26 NOTE — Progress Notes (Signed)
Pt in recovery with monitors in place, VSS. Report given to receiving RN. Bite guard was placed with pt awake to ensure comfort. No dental or soft tissue damage noted. RN will remove the guard when the pt is awake.  

## 2021-06-26 NOTE — Patient Instructions (Signed)
NO ASPIRIN, ASPIRIN CONTAINING PRODUCTS (BC OR GOODY POWDERS) OR NSAIDS (IBUPROFEN, ADVIL, ALEVE, AND MOTRIN); TYLENOL IS OK TO TAKE   YOU HAD AN ENDOSCOPIC PROCEDURE TODAY AT THE [facility]:   Refer to the procedure report that was given to you for any specific questions about what was found during the examination.  If the procedure report does not answer your questions, please call your gastroenterologist to clarify.  If you requested that your care partner not be given the details of your procedure findings, then the procedure report has been included in a sealed envelope for you to review at your convenience later.  YOU SHOULD EXPECT: Some feelings of bloating in the abdomen. Passage of more gas than usual.  Walking can help get rid of the air that was put into your GI tract during the procedure and reduce the bloating. If you had a lower endoscopy (such as a colonoscopy or flexible sigmoidoscopy) you may notice spotting of blood in your stool or on the toilet paper. If you underwent a bowel prep for your procedure, you may not have a normal bowel movement for a few days.  Please Note:  You might notice some irritation and congestion in your nose or some drainage.  This is from the oxygen used during your procedure.  There is no need for concern and it should clear up in a day or so.  SYMPTOMS TO REPORT IMMEDIATELY:  Following upper endoscopy (EGD)  Vomiting of blood or coffee ground material  New chest pain or pain under the shoulder blades  Painful or persistently difficult swallowing  New shortness of breath  Fever of 100F or higher  Black, tarry-looking stools  For urgent or emergent issues, a gastroenterologist can be reached at any hour by calling [phone]. Do not use MyChart messaging for urgent concerns.    DIET:  We do recommend a small meal at first, but then you may proceed to your regular diet.  Drink plenty of fluids but you should avoid alcoholic beverages  for 24 hours.  ACTIVITY:  You should plan to take it easy for the rest of today and you should NOT DRIVE or use heavy machinery until tomorrow (because of the sedation medicines used during the test).    FOLLOW UP: Our staff will call the number listed on your records 48-72 hours following your procedure to check on you and address any questions or concerns that you may have regarding the information given to you following your procedure. If we do not reach you, we will leave a message.  We will attempt to reach you two times.  During this call, we will ask if you have developed any symptoms of COVID 19. If you develop any symptoms (ie: fever, flu-like symptoms, shortness of breath, cough etc.) before then, please call [phone].  If you test positive for Covid 19 in the 2 weeks post procedure, please call and report this information to [place].    If any biopsies were taken you will be contacted by phone or by letter within the next 1-3 weeks.  Please call us at [phone] if you have not heard about the biopsies in 3 weeks.    SIGNATURES/CONFIDENTIALITY: You and/or your care partner have signed paperwork which will be entered into your electronic medical record.  These signatures attest to the fact that that the information above on your After Visit Summary has been reviewed and is understood.  Full responsibility of the confidentiality of this  discharge information lies with you and/or your care-partner.  

## 2021-06-26 NOTE — Progress Notes (Signed)
No changes since OV yesterday.

## 2021-06-28 ENCOUNTER — Telehealth: Payer: Self-pay | Admitting: *Deleted

## 2021-06-28 ENCOUNTER — Telehealth: Payer: Self-pay

## 2021-06-28 NOTE — Telephone Encounter (Signed)
Follow up call made. 

## 2021-06-28 NOTE — Telephone Encounter (Signed)
  Follow up Call-  Call back number 06/26/2021 07/27/2020  Post procedure Call Back phone  # [phone]  Permission to leave phone message Yes Yes  Some recent data might be hidden     Patient questions:  Do you have a fever, pain , or abdominal swelling? Yes.   Pain Score  7 *  Have you tolerated food without any problems? No.  Have you been able to return to your normal activities? Yes.    Do you have any questions about your discharge instructions: Diet   No. Medications  Yes.   Follow up visit  No.  Do you have questions or concerns about your Care? Yes.    Actions: * If pain score is 4 or above: Physician/ provider Notified : Owens Loffler, MD.

## 2021-06-30 NOTE — Telephone Encounter (Signed)
He can take his '81mg'$  ASA but no other ASA or NSAID type products.

## 2021-07-01 NOTE — Telephone Encounter (Signed)
Left message on machine to call back  

## 2021-07-02 NOTE — Telephone Encounter (Signed)
The pt has been advised he can take 81 mg ASA.  He also asked about path results.  I have advised him that the results are not available at this time and we will call him as soon as resulted and reviewed. The pt has been advised of the information and verbalized understanding.

## 2021-07-21 ENCOUNTER — Telehealth: Payer: Self-pay

## 2021-07-21 NOTE — Telephone Encounter (Signed)
Called pt to schedule AWV, no answer, LVMTRC. 

## 2021-08-01 NOTE — Telephone Encounter (Signed)
Tried again, no luck. [place].

## 2021-08-08 NOTE — Telephone Encounter (Signed)
Pt returned call but I could not get anyone in the office to answer at the time  CB#  [phone]

## 2021-08-14 ENCOUNTER — Other Ambulatory Visit: Payer: Self-pay

## 2021-08-14 ENCOUNTER — Ambulatory Visit: Payer: Self-pay

## 2021-08-14 ENCOUNTER — Telehealth (INDEPENDENT_AMBULATORY_CARE_PROVIDER_SITE_OTHER): Payer: Medicare Other | Admitting: Nurse Practitioner

## 2021-08-14 ENCOUNTER — Encounter: Payer: Self-pay | Admitting: Nurse Practitioner

## 2021-08-14 DIAGNOSIS — F419 Anxiety disorder, unspecified: Secondary | ICD-10-CM | POA: Diagnosis not present

## 2021-08-14 MED ORDER — ALPRAZOLAM 0.25 MG PO TABS: 0.2500 mg | ORAL_TABLET | Freq: Three times a day (TID) | ORAL | 0 refills | Status: AC | PRN

## 2021-08-14 NOTE — Progress Notes (Signed)
Virtual Visit via Telephone Note  I connected with Spencer Mendoza on 08/14/21 at 10:30 AM EDT by telephone and verified that I am speaking with the correct person using two identifiers.  Location: Patient: home Provider: office   I discussed the limitations, risks, security and privacy concerns of performing an evaluation and management service by telephone and the availability of in person appointments. I also discussed with the patient that there may be a patient responsible charge related to this service. The patient expressed understanding and agreed to proceed.   History of Present Illness:  Patient presents today for anxiety for televisit.  I have never seen this patient.  He is usually followed by Dr. Joya Gaskins.  Dr. Joya Gaskins did prescribe him Xanax in May.  Patient is needing a refill on this today. PDMP reviewed - xanax last prescribed in May for a 90 day supply. Patient does have an upcoming appointment with Dr. Joya Gaskins in 2 weeks. I will refill only for a limited amount since Dr. Joya Gaskins is out of the office today.  If this patient needs refills this will have to be done through Dr. Joya Gaskins.  We discussed that he does need to keep his upcoming appointment.  Patient states that he is currently not smoking or doing any other illegal substances.  He states that he is not drinking.  He has just been feeling very panicked for the past 2 to 3 days.  Patient does see psychiatry.  We discussed that this worsens or if he has any suicidal or homicidal thoughts he does need to go straight to the ED. Denies f/c/s, n/v/d, hemoptysis, PND, edema.     Observations/Objective:  Vitals with BMI 06/26/2021 06/26/2021 06/26/2021  Height - - -  Weight - - -  BMI - - -  Systolic 97 85 88  Diastolic 63 52 50  Pulse 44 48 46  Some encounter information is confidential and restricted. Go to Review Flowsheets activity to see all data.      Assessment and Plan:  Anxiety:  Will refill xanax for 5 days -  any further refills will need to be addressed by Dr. Lamar Benes.  Please keep upcoming appointment with Dr. Joya Gaskins  Please contact psychiatrist  Follow up:  Follow up as scheduled - if symptoms worsen please go to the ED  Patient Instructions  Anxiety:  Will refill xanax for 5 days - any further refills will need to be addressed by Dr. Lamar Benes.  Please keep upcoming appointment with Dr. Joya Gaskins  Please contact psychiatrist  Follow up:  Follow up as scheduled - if symptoms worsen please go to the ED  Managing Anxiety, Adult After being diagnosed with an anxiety disorder, you may be relieved to know why you have felt or behaved a certain way. You may also feel overwhelmed about the treatment ahead and what it will mean for your life. With care and support, you can manage this condition and recover from it. How to manage lifestyle changes Managing stress and anxiety Stress is your body's reaction to life changes and events, both good and bad. Most stress will last just a few hours, but stress can be ongoing and can lead to more than just stress. Although stress can play a major role in anxiety, it is not the same as anxiety. Stress is usually caused by something external, such as a deadline, test, or competition. Stress normally passes after the triggering event has ended.  Anxiety is caused by something internal, such as  imagining a terrible outcome or worrying that something will go wrong that will devastate you. Anxiety often does not go away even after the triggering event is over, and it can become long-term (chronic) worry. It is important to understand the differences between stress and anxiety and to manage your stress effectively so that it does not lead to an anxious response. Talk with your health care provider or a counselor to learn more about reducing anxiety and stress. He or she may suggest tension reduction techniques, such as: Music therapy. This can include creating or listening  to music that you enjoy and that inspires you. Mindfulness-based meditation. This involves being aware of your normal breaths while not trying to control your breathing. It can be done while sitting or walking. Centering prayer. This involves focusing on a word, phrase, or sacred image that means something to you and brings you peace. Deep breathing. To do this, expand your stomach and inhale slowly through your nose. Hold your breath for 3-5 seconds. Then exhale slowly, letting your stomach muscles relax. Self-talk. This involves identifying thought patterns that lead to anxiety reactions and changing those patterns. Muscle relaxation. This involves tensing muscles and then relaxing them. Choose a tension reduction technique that suits your lifestyle and personality. These techniques take time and practice. Set aside 5-15 minutes a day to do them. Therapists can offer counseling and training in these techniques. The training to help with anxiety may be covered by some insurance plans. Other things you can do to manage stress and anxiety include: Keeping a stress/anxiety diary. This can help you learn what triggers your reaction and then learn ways to manage your response. Thinking about how you react to certain situations. You may not be able to control everything, but you can control your response. Making time for activities that help you relax and not feeling guilty about spending your time in this way. Visual imagery and yoga can help you stay calm and relax.  Medicines Medicines can help ease symptoms. Medicines for anxiety include: Anti-anxiety drugs. Antidepressants. Medicines are often used as a primary treatment for anxiety disorder. Medicines will be prescribed by a health care provider. When used together, medicines, psychotherapy, and tension reduction techniques may be the most effective treatment. Relationships Relationships can play a big part in helping you recover. Try to spend  more time connecting with trusted friends and family members. Consider going to couples counseling, taking family education classes, or going to family therapy. Therapy can help you and others better understand your condition. How to recognize changes in your anxiety Everyone responds differently to treatment for anxiety. Recovery from anxiety happens when symptoms decrease and stop interfering with your daily activities at home or work. This may mean that you will start to: Have better concentration and focus. Worry will interfere less in your daily thinking. Sleep better. Be less irritable. Have more energy. Have improved memory. It is important to recognize when your condition is getting worse. Contact your health care provider if your symptoms interfere with home or work and you feel like your condition is not improving. Follow these instructions at home: Activity Exercise. Most adults should do the following: Exercise for at least 150 minutes each week. The exercise should increase your heart rate and make you sweat (moderate-intensity exercise). Strengthening exercises at least twice a week. Get the right amount and quality of sleep. Most adults need 7-9 hours of sleep each night. Lifestyle  Eat a healthy diet that includes plenty of  vegetables, fruits, whole grains, low-fat dairy products, and lean protein. Do not eat a lot of foods that are high in solid fats, added sugars, or salt. Make choices that simplify your life. Do not use any products that contain nicotine or tobacco, such as cigarettes, e-cigarettes, and chewing tobacco. If you need help quitting, ask your health care provider. Avoid caffeine, alcohol, and certain over-the-counter cold medicines. These may make you feel worse. Ask your pharmacist which medicines to avoid. General instructions Take over-the-counter and prescription medicines only as told by your health care provider. Keep all follow-up visits as told by your  health care provider. This is important. Where to find support You can get help and support from these sources: Self-help groups. Online and [employer]. A trusted spiritual leader. Couples counseling. Family education classes. Family therapy. Where to find more information You may find that joining a support group helps you deal with your anxiety. The following sources can help you locate counselors or support groups near you: [place]: www.mentalhealthamerica.net Anxiety and Depression Association of [place] (ADAA): https://www.clark.net/ National Alliance on Mental Illness (NAMI): www.nami.org Contact a health care provider if you: Have a hard time staying focused or finishing daily tasks. Spend many hours a day feeling worried about everyday life. Become exhausted by worry. Start to have headaches, feel tense, or have nausea. Urinate more than normal. Have diarrhea. Get help right away if you have: A racing heart and shortness of breath. Thoughts of hurting yourself or others. If you ever feel like you may hurt yourself or others, or have thoughts about taking your own life, get help right away. You can go to your nearest emergency department or call: Your local emergency services (911 in the [place] A suicide crisis helpline, such as the [place] at 781 129 9099. This is open 24 hours a day. Summary Taking steps to learn and use tension reduction techniques can help calm you and help prevent triggering an anxiety reaction. When used together, medicines, psychotherapy, and tension reduction techniques may be the most effective treatment. Family, friends, and partners can play a big part in helping you recover from an anxiety disorder. This information is not intended to replace advice given to you by your health care provider. Make sure you discuss any questions you have with your health care provider. Document Revised: 04/05/2019  Document Reviewed: 04/05/2019 Elsevier Patient Education  2022 [employer].     I discussed the assessment and treatment plan with the patient. The patient was provided an opportunity to ask questions and all were answered. The patient agreed with the plan and demonstrated an understanding of the instructions.   The patient was advised to call back or seek an in-person evaluation if the symptoms worsen or if the condition fails to improve as anticipated.  I provided 23 minutes of non-face-to-face time during this encounter.   Fenton Foy, NP

## 2021-08-14 NOTE — Telephone Encounter (Signed)
Pt has called back and is very upset that his call was not returned from the Sept 22. His correct number is [phone]. Pt is expecting a call back today.

## 2021-08-14 NOTE — Patient Instructions (Signed)
Anxiety:  Will refill xanax for 5 days - any further refills will need to be addressed by Dr. Lamar Benes.  Please keep upcoming appointment with Dr. Joya Gaskins  Please contact psychiatrist  Follow up:  Follow up as scheduled - if symptoms worsen please go to the ED  Managing Anxiety, Adult After being diagnosed with an anxiety disorder, you may be relieved to know why you have felt or behaved a certain way. You may also feel overwhelmed about the treatment ahead and what it will mean for your life. With care and support, you can manage this condition and recover from it. How to manage lifestyle changes Managing stress and anxiety Stress is your body's reaction to life changes and events, both good and bad. Most stress will last just a few hours, but stress can be ongoing and can lead to more than just stress. Although stress can play a major role in anxiety, it is not the same as anxiety. Stress is usually caused by something external, such as a deadline, test, or competition. Stress normally passes after the triggering event has ended.  Anxiety is caused by something internal, such as imagining a terrible outcome or worrying that something will go wrong that will devastate you. Anxiety often does not go away even after the triggering event is over, and it can become long-term (chronic) worry. It is important to understand the differences between stress and anxiety and to manage your stress effectively so that it does not lead to an anxious response. Talk with your health care provider or a counselor to learn more about reducing anxiety and stress. He or she may suggest tension reduction techniques, such as: Music therapy. This can include creating or listening to music that you enjoy and that inspires you. Mindfulness-based meditation. This involves being aware of your normal breaths while not trying to control your breathing. It can be done while sitting or walking. Centering prayer. This involves  focusing on a word, phrase, or sacred image that means something to you and brings you peace. Deep breathing. To do this, expand your stomach and inhale slowly through your nose. Hold your breath for 3-5 seconds. Then exhale slowly, letting your stomach muscles relax. Self-talk. This involves identifying thought patterns that lead to anxiety reactions and changing those patterns. Muscle relaxation. This involves tensing muscles and then relaxing them. Choose a tension reduction technique that suits your lifestyle and personality. These techniques take time and practice. Set aside 5-15 minutes a day to do them. Therapists can offer counseling and training in these techniques. The training to help with anxiety may be covered by some insurance plans. Other things you can do to manage stress and anxiety include: Keeping a stress/anxiety diary. This can help you learn what triggers your reaction and then learn ways to manage your response. Thinking about how you react to certain situations. You may not be able to control everything, but you can control your response. Making time for activities that help you relax and not feeling guilty about spending your time in this way. Visual imagery and yoga can help you stay calm and relax.  Medicines Medicines can help ease symptoms. Medicines for anxiety include: Anti-anxiety drugs. Antidepressants. Medicines are often used as a primary treatment for anxiety disorder. Medicines will be prescribed by a health care provider. When used together, medicines, psychotherapy, and tension reduction techniques may be the most effective treatment. Relationships Relationships can play a big part in helping you recover. Try to spend more time  connecting with trusted friends and family members. Consider going to couples counseling, taking family education classes, or going to family therapy. Therapy can help you and others better understand your condition. How to recognize  changes in your anxiety Everyone responds differently to treatment for anxiety. Recovery from anxiety happens when symptoms decrease and stop interfering with your daily activities at home or work. This may mean that you will start to: Have better concentration and focus. Worry will interfere less in your daily thinking. Sleep better. Be less irritable. Have more energy. Have improved memory. It is important to recognize when your condition is getting worse. Contact your health care provider if your symptoms interfere with home or work and you feel like your condition is not improving. Follow these instructions at home: Activity Exercise. Most adults should do the following: Exercise for at least 150 minutes each week. The exercise should increase your heart rate and make you sweat (moderate-intensity exercise). Strengthening exercises at least twice a week. Get the right amount and quality of sleep. Most adults need 7-9 hours of sleep each night. Lifestyle  Eat a healthy diet that includes plenty of vegetables, fruits, whole grains, low-fat dairy products, and lean protein. Do not eat a lot of foods that are high in solid fats, added sugars, or salt. Make choices that simplify your life. Do not use any products that contain nicotine or tobacco, such as cigarettes, e-cigarettes, and chewing tobacco. If you need help quitting, ask your health care provider. Avoid caffeine, alcohol, and certain over-the-counter cold medicines. These may make you feel worse. Ask your pharmacist which medicines to avoid. General instructions Take over-the-counter and prescription medicines only as told by your health care provider. Keep all follow-up visits as told by your health care provider. This is important. Where to find support You can get help and support from these sources: Self-help groups. Online and [employer]. A trusted spiritual leader. Couples counseling. Family education  classes. Family therapy. Where to find more information You may find that joining a support group helps you deal with your anxiety. The following sources can help you locate counselors or support groups near you: [place]: www.mentalhealthamerica.net Anxiety and Depression Association of [place] (ADAA): https://www.clark.net/ National Alliance on Mental Illness (NAMI): www.nami.org Contact a health care provider if you: Have a hard time staying focused or finishing daily tasks. Spend many hours a day feeling worried about everyday life. Become exhausted by worry. Start to have headaches, feel tense, or have nausea. Urinate more than normal. Have diarrhea. Get help right away if you have: A racing heart and shortness of breath. Thoughts of hurting yourself or others. If you ever feel like you may hurt yourself or others, or have thoughts about taking your own life, get help right away. You can go to your nearest emergency department or call: Your local emergency services (911 in the [place] A suicide crisis helpline, such as the [place] at 534-564-0126. This is open 24 hours a day. Summary Taking steps to learn and use tension reduction techniques can help calm you and help prevent triggering an anxiety reaction. When used together, medicines, psychotherapy, and tension reduction techniques may be the most effective treatment. Family, friends, and partners can play a big part in helping you recover from an anxiety disorder. This information is not intended to replace advice given to you by your health care provider. Make sure you discuss any questions you have with your health care provider. Document Revised:  04/05/2019 Document Reviewed: 04/05/2019 Elsevier Patient Education  [place].

## 2021-08-14 NOTE — Telephone Encounter (Signed)
Pt. States when the "weather changes I have more anxiety and panic attacks." Feels short of breath today. Takes medications for this. Warm transfer to [facility] in the practice for an appointment.    Answer Assessment - Initial Assessment Questions 1. CONCERN: "Did anything happen that prompted you to call today?"      Panic attacks 2. ANXIETY SYMPTOMS: "Can you describe how you (your loved one; patient) have been feeling?" (e.g., tense, restless, panicky, anxious, keyed up, overwhelmed, sense of impending doom).      Shortness 3. ONSET: "How long have you been feeling this way?" (e.g., hours, days, weeks)     Today 4. SEVERITY: "How would you rate the level of anxiety?" (e.g., 0 - 10; or mild, moderate, severe).     Moderate 5. FUNCTIONAL IMPAIRMENT: "How have these feelings affected your ability to do daily activities?" "Have you had more difficulty than usual doing your normal daily activities?" (e.g., getting better, same, worse; self-care, school, work, interactions)     No 6. HISTORY: "Have you felt this way before?" "Have you ever been diagnosed with an anxiety problem in the past?" (e.g., generalized anxiety disorder, panic attacks, PTSD). If Yes, ask: "How was this problem treated?" (e.g., medicines, counseling, etc.)     Yes 7. RISK OF HARM - SUICIDAL IDEATION: "Do you ever have thoughts of hurting or killing yourself?" If Yes, ask:  "Do you have these feelings now?" "Do you have a plan on how you would do this?"     No 8. TREATMENT:  "What has been done so far to treat this anxiety?" (e.g., medicines, relaxation strategies). "What has helped?"     Medication 9. TREATMENT - THERAPIST: "Do you have a counselor or therapist? Name?"     No 10. POTENTIAL TRIGGERS: "Do you drink caffeinated beverages (e.g., coffee, colas, teas), and how much daily?" "Do you drink alcohol or use any drugs?" "Have you started any new medicines recently?"     Weather 10. PATIENT SUPPORT: "Who is with you now?"  "Who do you live with?" "Do you have family or friends who you can talk to?"        Yes 35. OTHER SYMPTOMS: "Do you have any other symptoms?" (e.g., feeling depressed, trouble concentrating, trouble sleeping, trouble breathing, palpitations or fast heartbeat, chest pain, sweating, nausea, or diarrhea)       Short of breath, cough 12. PREGNANCY: "Is there any chance you are pregnant?" "When was your last menstrual period?"       N/a  Protocols used: Anxiety and Panic Attack-A-AH

## 2021-08-26 ENCOUNTER — Other Ambulatory Visit: Payer: Self-pay

## 2021-08-26 ENCOUNTER — Inpatient Hospital Stay (HOSPITAL_COMMUNITY)
Admission: EM | Admit: 2021-08-26 | Discharge: 2021-08-27 | DRG: 918 | Disposition: A | Discharge status: Other Acute Inpatient Hospital | Payer: Medicare Other | Attending: Family Medicine | Admitting: Family Medicine

## 2021-08-26 ENCOUNTER — Ambulatory Visit (HOSPITAL_COMMUNITY)
Admission: EM | Admit: 2021-08-26 | Discharge: 2021-08-26 | Disposition: A | Discharge status: Other Acute Inpatient Hospital | Payer: Medicare Other

## 2021-08-26 ENCOUNTER — Encounter (HOSPITAL_COMMUNITY): Payer: Self-pay | Admitting: *Deleted

## 2021-08-26 DIAGNOSIS — T43212A Poisoning by selective serotonin and norepinephrine reuptake inhibitors, intentional self-harm, initial encounter: Secondary | ICD-10-CM | POA: Diagnosis present

## 2021-08-26 DIAGNOSIS — R45851 Suicidal ideations: Secondary | ICD-10-CM

## 2021-08-26 DIAGNOSIS — J449 Chronic obstructive pulmonary disease, unspecified: Secondary | ICD-10-CM | POA: Diagnosis present

## 2021-08-26 DIAGNOSIS — F319 Bipolar disorder, unspecified: Secondary | ICD-10-CM | POA: Diagnosis present

## 2021-08-26 DIAGNOSIS — R9431 Abnormal electrocardiogram [ECG] [EKG]: Secondary | ICD-10-CM

## 2021-08-26 DIAGNOSIS — Z888 Allergy status to other drugs, medicaments and biological substances status: Secondary | ICD-10-CM

## 2021-08-26 DIAGNOSIS — F17210 Nicotine dependence, cigarettes, uncomplicated: Secondary | ICD-10-CM | POA: Diagnosis not present

## 2021-08-26 DIAGNOSIS — R400 Somnolence: Secondary | ICD-10-CM | POA: Diagnosis present

## 2021-08-26 DIAGNOSIS — R1084 Generalized abdominal pain: Secondary | ICD-10-CM | POA: Diagnosis not present

## 2021-08-26 DIAGNOSIS — Z820 Family history of epilepsy and other diseases of the nervous system: Secondary | ICD-10-CM

## 2021-08-26 DIAGNOSIS — R109 Unspecified abdominal pain: Secondary | ICD-10-CM | POA: Diagnosis not present

## 2021-08-26 DIAGNOSIS — I10 Essential (primary) hypertension: Secondary | ICD-10-CM | POA: Diagnosis not present

## 2021-08-26 DIAGNOSIS — F419 Anxiety disorder, unspecified: Secondary | ICD-10-CM | POA: Diagnosis present

## 2021-08-26 DIAGNOSIS — Z7951 Long term (current) use of inhaled steroids: Secondary | ICD-10-CM | POA: Diagnosis not present

## 2021-08-26 DIAGNOSIS — R1013 Epigastric pain: Secondary | ICD-10-CM | POA: Diagnosis not present

## 2021-08-26 DIAGNOSIS — Z79899 Other long term (current) drug therapy: Secondary | ICD-10-CM

## 2021-08-26 DIAGNOSIS — T50902A Poisoning by unspecified drugs, medicaments and biological substances, intentional self-harm, initial encounter: Secondary | ICD-10-CM | POA: Diagnosis not present

## 2021-08-26 DIAGNOSIS — K219 Gastro-esophageal reflux disease without esophagitis: Secondary | ICD-10-CM | POA: Diagnosis present

## 2021-08-26 DIAGNOSIS — F332 Major depressive disorder, recurrent severe without psychotic features: Secondary | ICD-10-CM

## 2021-08-26 DIAGNOSIS — Z7982 Long term (current) use of aspirin: Secondary | ICD-10-CM

## 2021-08-26 DIAGNOSIS — G4700 Insomnia, unspecified: Secondary | ICD-10-CM | POA: Diagnosis present

## 2021-08-26 DIAGNOSIS — F1010 Alcohol abuse, uncomplicated: Secondary | ICD-10-CM | POA: Diagnosis present

## 2021-08-26 DIAGNOSIS — Z8673 Personal history of transient ischemic attack (TIA), and cerebral infarction without residual deficits: Secondary | ICD-10-CM | POA: Diagnosis not present

## 2021-08-26 DIAGNOSIS — I2510 Atherosclerotic heart disease of native coronary artery without angina pectoris: Secondary | ICD-10-CM | POA: Diagnosis present

## 2021-08-26 DIAGNOSIS — Z885 Allergy status to narcotic agent status: Secondary | ICD-10-CM

## 2021-08-26 DIAGNOSIS — G43711 Chronic migraine without aura, intractable, with status migrainosus: Secondary | ICD-10-CM | POA: Diagnosis present

## 2021-08-26 DIAGNOSIS — F1020 Alcohol dependence, uncomplicated: Secondary | ICD-10-CM | POA: Diagnosis not present

## 2021-08-26 DIAGNOSIS — T43213A Poisoning by selective serotonin and norepinephrine reuptake inhibitors, assault, initial encounter: Secondary | ICD-10-CM | POA: Diagnosis present

## 2021-08-26 DIAGNOSIS — Z8249 Family history of ischemic heart disease and other diseases of the circulatory system: Secondary | ICD-10-CM

## 2021-08-26 DIAGNOSIS — M19032 Primary osteoarthritis, left wrist: Secondary | ICD-10-CM | POA: Diagnosis present

## 2021-08-26 DIAGNOSIS — Z823 Family history of stroke: Secondary | ICD-10-CM

## 2021-08-26 DIAGNOSIS — Z20822 Contact with and (suspected) exposure to covid-19: Secondary | ICD-10-CM | POA: Diagnosis not present

## 2021-08-26 DIAGNOSIS — R112 Nausea with vomiting, unspecified: Secondary | ICD-10-CM | POA: Diagnosis present

## 2021-08-26 DIAGNOSIS — G40909 Epilepsy, unspecified, not intractable, without status epilepticus: Secondary | ICD-10-CM | POA: Diagnosis present

## 2021-08-26 LAB — CBC WITH DIFFERENTIAL/PLATELET
Abs Immature Granulocytes: 0.02 10*3/uL (ref 0.00–0.07)
Basophils Absolute: 0.1 10*3/uL (ref 0.0–0.1)
Basophils Relative: 1 %
Eosinophils Absolute: 0.3 10*3/uL (ref 0.0–0.5)
Eosinophils Relative: 3 %
HCT: 46.7 % (ref 39.0–52.0)
Hemoglobin: 16.3 g/dL (ref 13.0–17.0)
Immature Granulocytes: 0 %
Lymphocytes Relative: 23 %
Lymphs Abs: 2.4 10*3/uL (ref 0.7–4.0)
MCH: 31.5 pg (ref 26.0–34.0)
MCHC: 34.9 g/dL (ref 30.0–36.0)
MCV: 90.2 fL (ref 80.0–100.0)
Monocytes Absolute: 0.7 10*3/uL (ref 0.1–1.0)
Monocytes Relative: 7 %
Neutro Abs: 6.9 10*3/uL (ref 1.7–7.7)
Neutrophils Relative %: 66 %
Platelets: 273 10*3/uL (ref 150–400)
RBC: 5.18 MIL/uL (ref 4.22–5.81)
RDW: 12.9 % (ref 11.5–15.5)
WBC: 10.4 10*3/uL (ref 4.0–10.5)
nRBC: 0 % (ref 0.0–0.2)

## 2021-08-26 LAB — COMPREHENSIVE METABOLIC PANEL
ALT: 30 U/L (ref 0–44)
AST: 30 U/L (ref 15–41)
Albumin: 4.4 g/dL (ref 3.5–5.0)
Alkaline Phosphatase: 80 U/L (ref 38–126)
Anion gap: 13 (ref 5–15)
BUN: 6 mg/dL — ABNORMAL LOW (ref 8–23)
CO2: 26 mmol/L (ref 22–32)
Calcium: 9.7 mg/dL (ref 8.9–10.3)
Chloride: 100 mmol/L (ref 98–111)
Creatinine, Ser: 0.92 mg/dL (ref 0.61–1.24)
GFR, Estimated: >60 mL/min (ref >60)
Glucose, Bld: 101 mg/dL — ABNORMAL HIGH (ref 70–99)
Potassium: 3.8 mmol/L (ref 3.5–5.1)
Sodium: 139 mmol/L (ref 135–145)
Total Bilirubin: 0.6 mg/dL (ref 0.3–1.2)
Total Protein: 7.1 g/dL (ref 6.5–8.1)

## 2021-08-26 LAB — ETHANOL: Alcohol, Ethyl (B): <10 mg/dL (ref <10)

## 2021-08-26 LAB — RESP PANEL BY RT-PCR (FLU A&B, COVID) ARPGX2
Influenza A by PCR: NEGATIVE
Influenza B by PCR: NEGATIVE
SARS Coronavirus 2 by RT PCR: NEGATIVE

## 2021-08-26 LAB — RAPID URINE DRUG SCREEN, HOSP PERFORMED
Amphetamines: NOT DETECTED
Barbiturates: NOT DETECTED
Benzodiazepines: NOT DETECTED
Cocaine: NOT DETECTED
Opiates: NOT DETECTED
Tetrahydrocannabinol: NOT DETECTED

## 2021-08-26 LAB — ACETAMINOPHEN LEVEL: Acetaminophen (Tylenol), Serum: <10 ug/mL — ABNORMAL LOW (ref 10–30)

## 2021-08-26 LAB — SALICYLATE LEVEL: Salicylate Lvl: <7 mg/dL — ABNORMAL LOW (ref 7.0–30.0)

## 2021-08-26 LAB — MAGNESIUM: Magnesium: 1.9 mg/dL (ref 1.7–2.4)

## 2021-08-26 MED ORDER — POTASSIUM CHLORIDE 10 MEQ/100ML IV SOLN
10.0000 meq | INTRAVENOUS | Status: AC
Start: 2021-08-26 — End: 2021-08-26
  Administered 2021-08-26 (×2): 10 meq via INTRAVENOUS
  Filled 2021-08-26 (×2): qty 100

## 2021-08-26 MED ORDER — LORAZEPAM 2 MG/ML IJ SOLN
1.0000 mg | Freq: Once | INTRAMUSCULAR | Status: AC
  Administered 2021-08-26: 1 mg via INTRAVENOUS
  Filled 2021-08-26: qty 1

## 2021-08-26 MED ORDER — ACETAMINOPHEN 325 MG PO TABS
650.0000 mg | ORAL_TABLET | Freq: Four times a day (QID) | ORAL | Status: DC | PRN
  Administered 2021-08-27: 650 mg via ORAL
  Filled 2021-08-26: qty 2

## 2021-08-26 MED ORDER — BUSPIRONE HCL 15 MG PO TABS
7.5000 mg | ORAL_TABLET | Freq: Every day | ORAL | Status: DC
  Administered 2021-08-27: 7.5 mg via ORAL
  Filled 2021-08-26: qty 1

## 2021-08-26 MED ORDER — ACETAMINOPHEN 650 MG RE SUPP: 650.0000 mg | Freq: Four times a day (QID) | RECTAL | Status: DC | PRN

## 2021-08-26 MED ORDER — PROCHLORPERAZINE EDISYLATE 10 MG/2ML IJ SOLN
5.0000 mg | Freq: Four times a day (QID) | INTRAMUSCULAR | Status: DC | PRN
  Administered 2021-08-26: 5 mg via INTRAVENOUS
  Filled 2021-08-26: qty 2

## 2021-08-26 MED ORDER — MAGNESIUM SULFATE 2 GM/50ML IV SOLN
2.0000 g | Freq: Once | INTRAVENOUS | Status: AC
  Administered 2021-08-26: 2 g via INTRAVENOUS
  Filled 2021-08-26: qty 50

## 2021-08-26 MED ORDER — THIAMINE HCL 100 MG PO TABS
100.0000 mg | ORAL_TABLET | Freq: Every day | ORAL | Status: DC
  Administered 2021-08-27: 100 mg via ORAL
  Filled 2021-08-26: qty 1

## 2021-08-26 MED ORDER — POLYETHYLENE GLYCOL 3350 17 G PO PACK: 17.0000 g | PACK | Freq: Every day | ORAL | Status: DC | PRN

## 2021-08-26 MED ORDER — ROSUVASTATIN CALCIUM 5 MG PO TABS
10.0000 mg | ORAL_TABLET | Freq: Every day | ORAL | Status: DC
  Administered 2021-08-27: 10 mg via ORAL
  Filled 2021-08-26: qty 2

## 2021-08-26 MED ORDER — PANTOPRAZOLE SODIUM 40 MG PO TBEC
40.0000 mg | DELAYED_RELEASE_TABLET | Freq: Every day | ORAL | Status: DC
  Administered 2021-08-27: 40 mg via ORAL
  Filled 2021-08-26: qty 1

## 2021-08-26 MED ORDER — FOLIC ACID 1 MG PO TABS
1.0000 mg | ORAL_TABLET | Freq: Every day | ORAL | Status: DC
  Administered 2021-08-27: 1 mg via ORAL
  Filled 2021-08-26: qty 1

## 2021-08-26 MED ORDER — ASPIRIN EC 81 MG PO TBEC
81.0000 mg | DELAYED_RELEASE_TABLET | Freq: Every day | ORAL | Status: DC
  Administered 2021-08-27: 81 mg via ORAL
  Filled 2021-08-26: qty 1

## 2021-08-26 MED ORDER — LEVETIRACETAM 500 MG PO TABS: 1000.0000 mg | ORAL_TABLET | Freq: Two times a day (BID) | ORAL | Status: DC

## 2021-08-26 MED ORDER — LEVETIRACETAM IN NACL 1000 MG/100ML IV SOLN
1000.0000 mg | Freq: Two times a day (BID) | INTRAVENOUS | Status: DC
  Administered 2021-08-27 (×2): 1000 mg via INTRAVENOUS
  Filled 2021-08-26 (×2): qty 100

## 2021-08-26 MED ORDER — FAMOTIDINE 20 MG PO TABS
20.0000 mg | ORAL_TABLET | Freq: Every day | ORAL | Status: DC
  Administered 2021-08-27: 20 mg via ORAL
  Filled 2021-08-26: qty 1

## 2021-08-26 MED ORDER — LORAZEPAM 2 MG/ML IJ SOLN
0.5000 mg | Freq: Once | INTRAMUSCULAR | Status: AC
  Administered 2021-08-26: 0.5 mg via INTRAVENOUS
  Filled 2021-08-26: qty 1

## 2021-08-26 MED ORDER — HEPARIN SODIUM (PORCINE) 5000 UNIT/ML IJ SOLN
5000.0000 [IU] | Freq: Three times a day (TID) | INTRAMUSCULAR | Status: DC
  Administered 2021-08-27 (×2): 5000 [IU] via SUBCUTANEOUS
  Filled 2021-08-26 (×2): qty 1

## 2021-08-26 MED ORDER — FLUTICASONE FUROATE-VILANTEROL 200-25 MCG/INH IN AEPB
1.0000 | INHALATION_SPRAY | Freq: Every day | RESPIRATORY_TRACT | Status: DC
  Administered 2021-08-27: 1 via RESPIRATORY_TRACT
  Filled 2021-08-26: qty 28

## 2021-08-26 MED ORDER — SUCRALFATE 1 G PO TABS
1.0000 g | ORAL_TABLET | Freq: Three times a day (TID) | ORAL | Status: DC
  Administered 2021-08-27 (×2): 1 g via ORAL
  Filled 2021-08-26 (×2): qty 1

## 2021-08-26 NOTE — ED Provider Notes (Signed)
Emergency Medicine Provider Triage Evaluation Note  Spencer Mendoza , a 60 y.o. male  was evaluated in triage.  Pt here from [facility] after suicide attempt. He took 15-16 tablets of 100 mg Trazodone about 2.5 hours ago (8:30 AM today) in an attempt to self harm. HE went to [facility] and they IVC'd him and brought him here. Currently complains of a headache and nausea.   Review of Systems  Positive: + SI, nausea, headache Negative:   Physical Exam  SpO2 97%  Gen:   Awake, no distress   Resp:  Normal effort  MSK:   Moves extremities without difficulty  Other:    Medical Decision Making  Medically screening exam initiated at 11:55 AM.  Appropriate orders placed.  Tyshawn Ciullo Slabaugh was informed that the remainder of the evaluation will be completed by another provider, this initial triage assessment does not replace that evaluation, and the importance of remaining in the ED until their evaluation is complete.  Spoke with Poison Control - observation for 4 hours. 4 hour post ingestion tylenol level. If prolonged qtc replace potassium and magnesium to high normals.    Eustaquio Maize, PA-C 08/26/21 [place], [place], DO 08/26/21 [id number]

## 2021-08-26 NOTE — ED Triage Notes (Signed)
Pt arrived by [facility] for intentional drug overdose, sent here from [facility] with ivc papers being drawn up, gpd present at triage. Pt reported two hours ago taking approx 10-16 trazadone with the intention of going to sleep and never waking up again. VSS.

## 2021-08-26 NOTE — ED Notes (Signed)
Pt states he does not want to talk with his sister at this time.

## 2021-08-26 NOTE — ED Provider Notes (Signed)
Behavioral Health Urgent Care Medical Screening Exam  Patient Name: Spencer Mendoza MRN: [id number] Date of Evaluation: 08/26/21 Chief Complaint:   Diagnosis:  Final diagnoses:  MDD (major depressive disorder), recurrent severe, without psychosis ([place])    History of Present illness: Spencer Mendoza is a 60 y.o. male.  Initially patient presents voluntarily to [facility] behavioral health for walk-in assessment, transported by police.  Per law enforcement report Spencer Mendoza was "cleared by EMS" prior to presentation here at [facility] behavioral health.  Upon my approach patient is visualized sitting on floor in front of toilet reporting recent vomiting. Patient endorses that he ingested #10 Trazodone tablets approx 2 hours ago and an intentional suicide overdose attempt. He is unable to recall trazodone tablet dosage.  Spencer Mendoza reports recent stressors include challenges with physical health.  He states "I was born with epilepsy and bipolar and I am on disability for migraines, there is nothing my doctors can do, nothing works, the migraines are so bad that you throw up and there is nothing nobody can do for me."  Spencer Mendoza reports he ingested 2 twisted tea alcoholic drinks as well as marijuana prior to arrival.  He is a poor historian regarding alcohol use and states "I do not drink every day."  He denies substance use aside from marijuana and alcohol.  Patient is assessed face-to-face by nurse practitioner. He is alert and oriented, minimally cooperative during assessment.  He reports depressed mood with tearful affect.   He denies homicidal ideations. He has normal speech and behavior.  He denies both auditory and visual hallucinations.  Patient is able to converse coherently with goal-directed thoughts and no distractibility or preoccupation.  He denies paranoia.  Objectively there is no evidence of psychosis/mania or delusional thinking.  He reports he is not currently linked to  outpatient psychiatry follow-up.  He resides in [place] with his girlfriend, he denies access to weapons.  He is not currently employed reports receives disability benefits.  He endorses average sleep and appetite.  He reports current medications include "trazodone, Keppra for seizures, 2 stomach pills, and low-dose aspirin, and a pill for cholesterol."  Patient offered support and encouragement.  Patient demands to leave prior to completion of assessment, states "I am voluntary I want to go home now."  Patient made aware this writer would consult with psychiatry team and involuntary commitment petition will be completed by team member.  Patient verbalizes understanding.  Psychiatric Specialty Exam  Presentation  General Appearance:Disheveled  Eye Contact:Fair  Speech:Clear and Coherent; Normal Rate  Speech Volume:Normal  Handedness:Right   Mood and Affect  Mood:Depressed; Irritable  Affect:Depressed; Labile   Thought Process  Thought Processes:Coherent; Goal Directed; Linear  Descriptions of Associations:Intact  Orientation:Full (Time, Place and Person)  Thought Content:Logical; WDL  Diagnosis of Schizophrenia or Schizoaffective disorder in past: No   Hallucinations:None  Ideas of Reference:None  Suicidal Thoughts:Yes, Active With Intent; With Plan; With Means to [place]  Homicidal Thoughts:No   Sensorium  Memory:Immediate Good; Recent Good; Remote Good  Judgment:Intact  Insight:Lacking   Executive Functions  Concentration:Fair  Attention Span:Fair  [place]   Psychomotor Activity  Psychomotor Activity:Normal   Assets  Assets:Communication Skills; Desire for Improvement; Housing; Catering manager; Intimacy   Sleep  Sleep:Fair  Number of hours:  No data recorded  Nutritional Assessment (For OBS and FBC admissions only) Has the patient had a weight loss or gain of 10 pounds or  more in the  last 3 months?: No Has the patient had a decrease in food intake/or appetite?: No Does the patient have dental problems?: No Does the patient have eating habits or behaviors that may be indicators of an eating disorder including binging or inducing vomiting?: No Has the patient recently lost weight without trying?: 0 Has the patient been eating poorly because of a decreased appetite?: 0 Malnutrition Screening Tool Score: 0   Physical Exam: Physical Exam Vitals and nursing note reviewed.  Constitutional:      Appearance: He is well-developed. He is ill-appearing.  HENT:     Head: Normocephalic and atraumatic.     Nose: Nose normal.  Cardiovascular:     Rate and Rhythm: Normal rate.  Pulmonary:     Effort: Pulmonary effort is normal.  Musculoskeletal:        General: Normal range of motion.     Cervical back: Normal range of motion.  Skin:    General: Skin is warm and dry.  Neurological:     Mental Status: He is alert and oriented to person, place, and time.  Psychiatric:        Attention and Perception: Attention and perception normal.        Mood and Affect: Mood is depressed. Affect is labile and tearful.        Speech: Speech normal.        Behavior: Behavior is cooperative.        Thought Content: Thought content includes suicidal ideation. Thought content includes suicidal plan.        Cognition and Memory: Cognition and memory normal.        Judgment: Judgment is impulsive.   Review of Systems  Constitutional: Negative.   HENT: Negative.    Eyes: Negative.   Respiratory: Negative.    Cardiovascular: Negative.   Gastrointestinal: Negative.   Genitourinary: Negative.   Musculoskeletal: Negative.   Skin: Negative.   Neurological: Negative.   Endo/Heme/Allergies: Negative.   Psychiatric/Behavioral:  Positive for depression, substance abuse and suicidal ideas.   Blood pressure (!) 145/105, pulse 95, temperature 98.3 F (36.8 C), resp. rate 18, SpO2 98 %.  There is no height or weight on file to calculate BMI.  Musculoskeletal: Strength & Muscle Tone: within normal limits Gait & Station: normal Patient leans: N/A   [facility] MSE Discharge Disposition for Follow up and Recommendations: Based on my evaluation the patient appears to have an emergency medical condition for which I recommend the patient be transferred to the emergency department for further evaluation.  Patient accepted to [facility] emergency department by Dr. Tyrone Nine for medical clearance prior to inpatient psychiatric hospitalization related to intentional overdose in a suicide attempt. Patient reviewed with Dr. Serafina Mitchell. Involuntary commitment petition will be completed by psychiatry provider at [facility] behavioral health.   Lucky Rathke, FNP 08/26/2021, 11:03 AM

## 2021-08-26 NOTE — H&P (Addendum)
[facility] Admission History and Physical Service Pager: [phone]  Patient name: Spencer Mendoza Medical record number: [id number] Date of birth: 03/29/1961 Age: 60 y.o. Gender: male  Primary Care Provider: Elsie Stain, MD Consultants: None Code Status: full  Preferred Emergency Contact: [name], [place], [phone]  Chief Complaint: SI and medication overdose  Assessment and Plan: Spencer Mendoza is a 60 y.o. male presenting with SI and medication overdose. PMH is significant for hypertension, depression, substance abuse, COPD, CAD,   Trazodone overdose Patient came to ED from behavioral health and was involuntary committed due to drug overdose of 15 100 mg trazodone tabs this morning and has been vomiting since this morning.  Patient was still nauseous with severe abdominal pain and migraine headache upon exam.  In ED, EKG showed prolonged QTC at 565, potassium and magnesium WNL however given prolonged QTC, per poison control patient has received IV potassium and IV magnesium. In ED, tylenol level, salicylate level WNL and was negative for ethanol.  In ED, Patient received Ativan 1 mg followed by 0.5 mg. -admit to medical tele  -Psych consult -Tylenol 650 every 6 hours. -Continuous cardiac monitoring -Neurochecks every 4 hours -AM CBC -AM CMP -AM EKG -Abdominal x-ray -Saline lock IV -Compazine 5 mg every 6 hours as needed -MiraLAX 17 g daily prn -VS per floor protocol   Depression and anxiety Home medications include BuSpar 7.5 mg daily.  Per chart review, patient was prescribed Xanax for anxiety on 08/14/2021 but only received 5 days worth. -Continue BuSpar 7.5 mg daily  Insomnia Home medication includes trazodone 200 mg at bedtime which pt overdosed on this morning -hold trazodone   History of alcohol use disorder Patient denies heavy alcohol use on exam.  States he did drink 2 beers this morning.  Per chart review, patient has  extensive history of alcohol abuse.  Home medications include thiamine 500 mg daily and folic acid 1 mg daily -Continue thiamine 938 mg daily and folic acid 1 mg daily -CIWA every 4 hours  Seizure disorder Pt sees [facility]. States last seizure was a couple months ago.  Home medication includes Keppra 1000 mg twice daily -Continue Keppra 1000 mg twice daily  COPD Home medications include albuterol prn, Breo Ellipta.  Patient is not currently having any respiratory symptoms. -Continue Breo Ellipta 1 puff daily -Hold albuterol due to prolonged QTC   CAD Home medication includes aspirin 81 mg daily and Crestor 10 mg daily -Continue aspirin 81 mg daily -Continue Crestor 10 mg daily  GERD Home medication includes Pepcid, Protonix, and Carafate -Continue Pepcid 20 mg daily -Continue Protonix 40 mg daily -Continue Carafate 1 g 3 times daily with meals and at bedtime  FEN/GI: heart healthy  Prophylaxis: heparin   Disposition: med tele  History of Present Illness:  Spencer Mendoza is a 60 y.o. male presenting with medication overdose for suicide attempt.   Patient was somewhat somnolent during questioning and exam.  Difficult to get information from, was able to get responses through questioning after reading reports from ED notes. Paitent reports taking multiple trazdone pills (15 to 16 tablets of 100 mg per ED note) because he wanted to sleep. He states that he has had problems with migraines and seizures his whole life and felt very stressed that made him feel overwhelmed and sad. He has since been having 8/10 stomach pain, nausea, and a migraine headache. Denies attempts of suicide attempt in the past.  Patient reports using dialy THC. He reports drinking 2 beers this morning but denies drinking regularly.  He denies use of cocaine or heroin. He reports taking Xanax as a prescription.   He reports taking keppra, Xanax, crestor, asa 81 mg.  Review Of  Systems: Per HPI with the following additions:   Review of Systems  Constitutional:  Negative for chills and fever.  HENT:  Negative for rhinorrhea and sore throat.   Respiratory:  Negative for shortness of breath.   Cardiovascular:  Negative for chest pain.  Gastrointestinal:  Positive for abdominal pain, nausea and vomiting. Negative for diarrhea.  Genitourinary:  Negative for dysuria.  Neurological:  Positive for dizziness and headaches. Negative for seizures.  Psychiatric/Behavioral:  Positive for suicidal ideas. Negative for hallucinations.     Patient Active Problem List   Diagnosis Date Noted   Suicide attempt by drug ingestion, initial encounter ([place]) 08/26/2021   Exanthem due to herpes zoster 04/01/2021   Abdominal aortic atherosclerosis ([place]) with 50% occlusion distal aorta 03/05/2020   CAD (coronary artery disease) 03/05/2020   Osteoarthritis of left wrist 01/04/2020   Liver hemangioma 09/21/2019   Tobacco use 08/30/2019   COPD with chronic bronchitis ([place]) 08/30/2019   Chronic migraine without aura, with intractable migraine, so stated, with status migrainosus 08/25/2019   MDD (major depressive disorder), recurrent severe, without psychosis ([place]) 11/18/2018   Hemiplegic migraine with status migrainosus 06/06/2015   Chronic insomnia 06/06/2015   Seizures ([place]) 01/09/2014   Chronic low back pain 12/28/2013   History of stroke 12/04/2013   Hypertension 12/04/2013    Past Medical History: Past Medical History:  Diagnosis Date   Alcohol withdrawal ([place]) 07/29/2013   Allergy    Anxiety    Arthritis    Benzodiazepine abuse ([place]) 08/30/2019   Bipolar disorder ([place])    CAD (coronary artery disease)    Chronic insomnia 06/06/2015   Chronic migraine without aura, with intractable migraine, so stated, with status migrainosus 08/25/2019   Claustrophobia    Cocaine abuse ([place]) 05/22/2015   COPD (chronic obstructive pulmonary disease) ([place])    Delirium tremens ([place])  07/29/2013   Depression    Epilepsy ([place])    last seizure week of 06-25-2020- sev. grand mal seizures per pt    Hemiplegic migraine with status migrainosus 06/06/2015   Hypertension    Migraine    Opioid use disorder 08/30/2019   PONV (postoperative nausea and vomiting)    Stroke [facility])    patient denies- states had a hemiplegic migraine in ~2011   Substance abuse ([place])     Past Surgical History: Past Surgical History:  Procedure Laterality Date   ANKLE SURGERY     in high school   APPENDECTOMY     COLONOSCOPY     KNEE ARTHROSCOPY Right    NOSE SURGERY      Social History: Social History   Tobacco Use   Smoking status: Every Day    Years: 15.00    Types: Cigarettes   Smokeless tobacco: Never   Tobacco comments:    Reports 2 cigarettes daily  Vaping Use   Vaping Use: Never used  Substance Use Topics   Alcohol use: Not Currently    Alcohol/week: 6.0 standard drinks    Types: 6 Cans of beer per week    Comment: none since Sept 2020   Drug use: Yes    Types: Marijuana    Comment: hx of crack, heroin; only THC since Sept 2020    Please  also refer to relevant sections of EMR.  Family History: Family History  Problem Relation Age of Onset   Heart disease Mother    Stroke Mother    Epilepsy Mother    Heart failure Mother    Cancer Father    Heart disease Father    Hypertension Father    Melanoma Father    Migraines Father    Colon cancer Sister 64   Colon cancer Cousin    Cancer Cousin    Colon polyps Neg Hx    Esophageal cancer Neg Hx    Rectal cancer Neg Hx    Stomach cancer Neg Hx      Allergies and Medications: Allergies  Allergen Reactions   Sertraline Other (See Comments)    Erectile dysfunction   Codeine Nausea And Vomiting   Depakote [Divalproex Sodium] Other (See Comments)    Made patient shake   Migranal [Dihydroergotamine] Nausea And Vomiting   Topamax [Topiramate] Other (See Comments)    Made patient angry   No current  facility-administered medications on file prior to encounter.   Current Outpatient Medications on File Prior to Encounter  Medication Sig Dispense Refill   albuterol (VENTOLIN HFA) 108 (90 Base) MCG/ACT inhaler INHALE 2 PUFFS BY MOUTH INTO THE LUNGS EVERY 4 HOURS AS NEEDED FOR SHORTNESS OF BREATH OR WHEEZING 8.5 g 3   aspirin EC 81 MG tablet Take 1 tablet (81 mg total) by mouth daily. Swallow whole. 30 tablet 11   budesonide-formoterol (SYMBICORT) 160-4.5 MCG/ACT inhaler Inhale 2 puffs into the lungs 2 (two) times daily. 10.2 g 5   busPIRone (BUSPAR) 7.5 MG tablet TAKE 1 TABLET(7.5 MG) BY MOUTH DAILY 30 tablet 1   famotidine (PEPCID) 20 MG tablet TAKE 1 TABLET(20 MG) BY MOUTH AT BEDTIME 90 tablet 1   folic acid (FOLVITE) 1 MG tablet Take 1 tablet (1 mg total) by mouth daily. 30 tablet 3   levETIRAcetam (KEPPRA) 1000 MG tablet Take 1 tablet (1,000 mg total) by mouth 2 (two) times daily. 60 tablet 11   ondansetron (ZOFRAN ODT) 4 MG disintegrating tablet Dissolve 1 tablet every 4 hours as needed for nausea/vomit 14 tablet 0   pantoprazole (PROTONIX) 40 MG tablet TAKE 1 TABLET(40 MG) BY MOUTH DAILY 30 MINUTES BEFORE BREAKFAST 90 tablet 1   rosuvastatin (CRESTOR) 10 MG tablet Take 1 tablet (10 mg total) by mouth daily. 90 tablet 3   sucralfate (CARAFATE) 1 g tablet Take 1 tablet (1 g total) by mouth 4 (four) times daily -  with meals and at bedtime. 21 tablet 0   thiamine 100 MG tablet Take 1 tablet (100 mg total) by mouth daily. 30 tablet 3   traZODone (DESYREL) 100 MG tablet TAKE 2 TABLETS(200 MG) BY MOUTH AT BEDTIME 60 tablet 2    Objective: BP 115/77   Pulse 65   Temp 99.4 F (37.4 C) (Oral)   Resp 19   SpO2 93%  Exam: General: somnolent, lying in bed, NAD Eyes: white sclera ENTM: dry MMM Cardiovascular: RRR, normal S1/S2, cap refill <2 sec Respiratory: CTAB, normal WOB Gastrointestinal: normal bowel sounds, diffuse tenderness to palpation with guarding, non distended  MSK: 5/5 muscle  strength for UE and 4/5 for LE Derm: warm and dry Neuro: CN II-XII intact, sensation intact, somewhat somnolent Psych: withdrawn, flat affect, slow speech, good insight, oriented  Labs and Imaging: CBC BMET  Recent Labs  Lab 08/26/21 1208  WBC 10.4  HGB 16.3  HCT 46.7  PLT 273  Recent Labs  Lab 08/26/21 1208  NA 139  K 3.8  CL 100  CO2 26  BUN 6*  CREATININE 0.92  GLUCOSE 101*  CALCIUM 9.7     EKG: My own interpretation (not copied from electronic read) Sinus rhythm, vent. rate of 87, QTC 565   Precious Gilding, DO 08/27/2021, 1:02 AM PGY-1, [place] Intern pager: [phone], text pages welcome  FPTS Upper-Level Resident Addendum   I have independently interviewed and examined the patient. I have discussed the above with Dr.Jones and agree with the documented plan. My edits for correction/addition/clarification are included above. Please see any attending notes.   Eulis Foster, MD PGY-3, [facility] 08/27/2021 6:33 AM  FPTS Service pager: [phone] (text pages welcome through [facility])

## 2021-08-26 NOTE — Progress Notes (Signed)
EMS personnel informed that Dr. Tyrone Nine, [place].accepted patient. EMS also informed that patient is being petitioned at this time.

## 2021-08-26 NOTE — BH Assessment (Signed)
Pt reports intentionally overdosing on 15-18 trazodone pills about 2 hours ago. Pt reports he has epilepsy, migraine headache and diagnosis of bipolar disorder that seems to be getting worse. Pt could not complete triage due to vomiting. Pt has an unsteady gait.   Pt is emergent

## 2021-08-26 NOTE — ED Notes (Signed)
Spencer Mendoza, sister, [phone] would like to know if she can speak with the pt

## 2021-08-26 NOTE — ED Provider Notes (Signed)
[facility] EMERGENCY DEPARTMENT Provider Note   CSN: [id number] Arrival date & time: 08/26/21  1110     History Chief Complaint  Patient presents with   Drug Overdose   Suicidal    Spencer Mendoza is a 60 y.o. male with PMHx HTN, depression, substance abuse, COPD, CAD who presents to the ED today for drug overdose/suicide attempt.  Earlier today around 830 to 9 AM he took 15 to 16 tablets of 100 mg trazodone in an attempt to self-harm.  He went to behavioral health urgent care who IVC him and brought him to the ED for further evaluation.  Patient reports he has a headache and some nausea.  No other complaints.   The history is provided by the patient and medical records.      Past Medical History:  Diagnosis Date   Alcohol withdrawal ([place]) 07/29/2013   Allergy    Anxiety    Arthritis    Benzodiazepine abuse ([place]) 08/30/2019   Bipolar disorder ([place])    CAD (coronary artery disease)    Chronic insomnia 06/06/2015   Chronic migraine without aura, with intractable migraine, so stated, with status migrainosus 08/25/2019   Claustrophobia    Cocaine abuse ([place]) 05/22/2015   COPD (chronic obstructive pulmonary disease) ([place])    Delirium tremens ([place]) 07/29/2013   Depression    Epilepsy ([place])    last seizure week of 06-25-2020- sev. grand mal seizures per pt    Hemiplegic migraine with status migrainosus 06/06/2015   Hypertension    Migraine    Opioid use disorder 08/30/2019   PONV (postoperative nausea and vomiting)    Stroke [facility])    patient denies- states had a hemiplegic migraine in ~2011   Substance abuse [facility])     Patient Active Problem List   Diagnosis Date Noted   Exanthem due to herpes zoster 04/01/2021   Abdominal aortic atherosclerosis ([place]) with 50% occlusion distal aorta 03/05/2020   CAD (coronary artery disease) 03/05/2020   Osteoarthritis of left wrist 01/04/2020   Liver hemangioma 09/21/2019   Tobacco use 08/30/2019   COPD with  chronic bronchitis ([place]) 08/30/2019   Chronic migraine without aura, with intractable migraine, so stated, with status migrainosus 08/25/2019   MDD (major depressive disorder), recurrent severe, without psychosis ([place]) 11/18/2018   Hemiplegic migraine with status migrainosus 06/06/2015   Chronic insomnia 06/06/2015   Seizures ([place]) 01/09/2014   Chronic low back pain 12/28/2013   History of stroke 12/04/2013   Hypertension 12/04/2013    Past Surgical History:  Procedure Laterality Date   ANKLE SURGERY     in high school   APPENDECTOMY     COLONOSCOPY     KNEE ARTHROSCOPY Right    NOSE SURGERY         Family History  Problem Relation Age of Onset   Heart disease Mother    Stroke Mother    Epilepsy Mother    Heart failure Mother    Cancer Father    Heart disease Father    Hypertension Father    Melanoma Father    Migraines Father    Colon cancer Sister 17   Colon cancer Cousin    Cancer Cousin    Colon polyps Neg Hx    Esophageal cancer Neg Hx    Rectal cancer Neg Hx    Stomach cancer Neg Hx     Social History   Tobacco Use   Smoking status: Every Day    Years: 15.00  Types: Cigarettes   Smokeless tobacco: Never   Tobacco comments:    Reports 2 cigarettes daily  Vaping Use   Vaping Use: Never used  Substance Use Topics   Alcohol use: Not Currently    Alcohol/week: 6.0 standard drinks    Types: 6 Cans of beer per week    Comment: none since Sept 2020   Drug use: Yes    Types: Marijuana    Comment: hx of crack, heroin; only THC since Sept 2020    Home Medications Prior to Admission medications   Medication Sig Start Date End Date Taking? Authorizing Provider  albuterol (VENTOLIN HFA) 108 (90 Base) MCG/ACT inhaler INHALE 2 PUFFS BY MOUTH INTO THE LUNGS EVERY 4 HOURS AS NEEDED FOR SHORTNESS OF BREATH OR WHEEZING 03/19/21   Elsie Stain, MD  aspirin EC 81 MG tablet Take 1 tablet (81 mg total) by mouth daily. Swallow whole. 07/17/20   Jerline Pain, MD   budesonide-formoterol (SYMBICORT) 160-4.5 MCG/ACT inhaler Inhale 2 puffs into the lungs 2 (two) times daily. 03/19/21   Elsie Stain, MD  busPIRone (BUSPAR) 7.5 MG tablet TAKE 1 TABLET(7.5 MG) BY MOUTH DAILY 05/17/21   Elsie Stain, MD  famotidine (PEPCID) 20 MG tablet TAKE 1 TABLET(20 MG) BY MOUTH AT BEDTIME 06/25/21   Noralyn Pick, NP  folic acid (FOLVITE) 1 MG tablet Take 1 tablet (1 mg total) by mouth daily. 03/19/21   Elsie Stain, MD  levETIRAcetam (KEPPRA) 1000 MG tablet Take 1 tablet (1,000 mg total) by mouth 2 (two) times daily. 02/25/21   Suzzanne Cloud, NP  ondansetron (ZOFRAN ODT) 4 MG disintegrating tablet Dissolve 1 tablet every 4 hours as needed for nausea/vomit 06/03/21   Elsie Stain, MD  pantoprazole (PROTONIX) 40 MG tablet TAKE 1 TABLET(40 MG) BY MOUTH DAILY 30 MINUTES BEFORE BREAKFAST 06/25/21   Noralyn Pick, NP  rosuvastatin (CRESTOR) 10 MG tablet Take 1 tablet (10 mg total) by mouth daily. 03/19/21   Elsie Stain, MD  sucralfate (CARAFATE) 1 g tablet Take 1 tablet (1 g total) by mouth 4 (four) times daily -  with meals and at bedtime. 06/03/21   Elsie Stain, MD  thiamine 100 MG tablet Take 1 tablet (100 mg total) by mouth daily. 03/19/21   Elsie Stain, MD  traZODone (DESYREL) 100 MG tablet TAKE 2 TABLETS(200 MG) BY MOUTH AT BEDTIME 06/16/21   Elsie Stain, MD    Allergies    Sertraline, Codeine, Depakote [divalproex sodium], Migranal [dihydroergotamine], and Topamax [topiramate]  Review of Systems   Review of Systems  Constitutional:  Negative for fever.  Gastrointestinal:  Positive for abdominal pain and nausea. Negative for vomiting.  Psychiatric/Behavioral:  Positive for self-injury.   All other systems reviewed and are negative.  Physical Exam Updated Vital Signs BP 136/65   Pulse 76   Temp 98.6 F (37 C) (Oral)   Resp (!) 25   SpO2 95%   Physical Exam Vitals and nursing note reviewed.  Constitutional:       Appearance: He is not ill-appearing or diaphoretic.     Comments: Actively vomiting  HENT:     Head: Normocephalic and atraumatic.  Eyes:     Conjunctiva/sclera: Conjunctivae normal.  Cardiovascular:     Rate and Rhythm: Normal rate and regular rhythm.     Pulses: Normal pulses.  Pulmonary:     Effort: Pulmonary effort is normal.     Breath sounds: Normal breath  sounds. No wheezing, rhonchi or rales.  Abdominal:     Palpations: Abdomen is soft.     Tenderness: There is no abdominal tenderness. There is no guarding or rebound.  Musculoskeletal:     Cervical back: Neck supple.  Skin:    General: Skin is warm and dry.  Neurological:     Mental Status: He is alert.    ED Results / Procedures / Treatments   Labs (all labs ordered are listed, but only abnormal results are displayed) Labs Reviewed  COMPREHENSIVE METABOLIC PANEL - Abnormal; Notable for the following components:      Result Value   Glucose, Bld 101 (*)    BUN 6 (*)    All other components within normal limits  ACETAMINOPHEN LEVEL - Abnormal; Notable for the following components:   Acetaminophen (Tylenol), Serum <10 (*)    All other components within normal limits  SALICYLATE LEVEL - Abnormal; Notable for the following components:   Salicylate Lvl <0.2 (*)    All other components within normal limits  RESP PANEL BY RT-PCR (FLU A&B, COVID) ARPGX2  CBC WITH DIFFERENTIAL/PLATELET  MAGNESIUM  ETHANOL  RAPID URINE DRUG SCREEN, HOSP PERFORMED    EKG EKG Interpretation  Date/Time:  Monday August 26 2021 12:02:58 EDT Ventricular Rate:  87 PR Interval:  180 QRS Duration: 98 QT Interval:  470 QTC Calculation: 565 R Axis:   148 Text Interpretation:  Critical Test Result: Long QTc Normal sinus rhythm Right axis deviation Septal infarct , age undetermined Prolonged QT Abnormal ECG Otherwise no significant change Confirmed by Deno Etienne [phone]) on 08/26/2021 2:53:54 PM  Radiology No results  found.  Procedures Procedures   Medications Ordered in ED Medications  LORazepam (ATIVAN) injection 0.5 mg (has no administration in time range)  LORazepam (ATIVAN) injection 1 mg (1 mg Intravenous Given 08/26/21 1525)  potassium chloride 10 mEq in 100 mL IVPB (0 mEq Intravenous Stopped 08/26/21 1625)  magnesium sulfate IVPB 2 g 50 mL (0 g Intravenous Stopped 08/26/21 1731)    ED Course  I have reviewed the triage vital signs and the nursing notes.  Pertinent labs & imaging results that were available during my care of the patient were reviewed by me and considered in my medical decision making (see chart for details).    MDM Rules/Calculators/A&P                           60 year old male who presents to the ED today from behavioral health under IVC secondary to drug overdose.  Took 15 100 mg trazodone earlier today.  Has been having some abdominal cramping, nausea, vomiting, headache since that time.  EKG obtained which does show prolonged QTC at 565.  His potassium is normal at 3.8 and mag normal at 1.9 however given prolonged QTC IV potassium and IV mag provided at this time per poison control recommendations.  Pending 4-hour acetaminophen level, delayed due to patient being in the waiting room.  He will need to be admitted for prolonged QTC.  His IVC paperwork has been filed and he will need TTS consult once he is medically cleared.  Unable to medically clear due to prolonged QTC.   Tylenol level < 10 Salicylate level < 10 Etoh Negative  Discussed case with family medicine who will come evaluate patient for admission. Will have their night team come see patient.   This note was prepared using Dragon voice recognition software and may include unintentional  dictation errors due to the inherent limitations of voice recognition software.   Final Clinical Impression(s) / ED Diagnoses Final diagnoses:  Prolonged Q-T interval on ECG  Intentional overdose, initial encounter [facility])   Suicidal ideations    Rx / DC Orders ED Discharge Orders     None        Eustaquio Maize, PA-C 08/26/21 [place], [place], DO 08/27/21 0715

## 2021-08-26 NOTE — ED Notes (Signed)
Pt vomiting and c/o periumbilical pain.  Called pharmacy to verify compazine.

## 2021-08-26 NOTE — Progress Notes (Signed)
Spencer Mendoza to be transferred to [facility] per FNP order. EMTALA and Med Necessity forms were printed and to be given to the receiving nurse. Patient escorted out and was transported via EMS. Clois Dupes  08/26/2021 11:05 AM

## 2021-08-26 NOTE — ED Notes (Signed)
Patient transported to X-ray 

## 2021-08-26 NOTE — ED Notes (Signed)
Pt has vomited twice since being in exam room.

## 2021-08-26 NOTE — ED Notes (Signed)
Talked with Poison control. Discussed Potassium and Mag runs. Pt already received 2 runs of K+ and 1 of Mag. Pt resting comfortably.

## 2021-08-26 NOTE — Progress Notes (Signed)
Call placed X2 to [facility] to give nurse to nurse report. Call was re-routed to [phone]. No answer.

## 2021-08-26 NOTE — ED Notes (Signed)
Pt wanded by security. 

## 2021-08-27 ENCOUNTER — Inpatient Hospital Stay (HOSPITAL_COMMUNITY)
Admission: AD | Admit: 2021-08-27 | Discharge: 2021-09-03 | DRG: 885 | Disposition: A | Discharge status: Home / Self Care | Payer: Medicare Other | Source: Intra-hospital | Attending: Psychiatry | Admitting: Psychiatry

## 2021-08-27 ENCOUNTER — Encounter (HOSPITAL_COMMUNITY): Payer: Self-pay | Admitting: Psychiatry

## 2021-08-27 ENCOUNTER — Inpatient Hospital Stay (HOSPITAL_COMMUNITY): Payer: Medicare Other

## 2021-08-27 ENCOUNTER — Other Ambulatory Visit: Payer: Self-pay

## 2021-08-27 DIAGNOSIS — T50902A Poisoning by unspecified drugs, medicaments and biological substances, intentional self-harm, initial encounter: Secondary | ICD-10-CM

## 2021-08-27 DIAGNOSIS — Z7982 Long term (current) use of aspirin: Secondary | ICD-10-CM | POA: Diagnosis not present

## 2021-08-27 DIAGNOSIS — Z79899 Other long term (current) drug therapy: Secondary | ICD-10-CM | POA: Diagnosis not present

## 2021-08-27 DIAGNOSIS — J449 Chronic obstructive pulmonary disease, unspecified: Secondary | ICD-10-CM | POA: Diagnosis present

## 2021-08-27 DIAGNOSIS — F1020 Alcohol dependence, uncomplicated: Secondary | ICD-10-CM

## 2021-08-27 DIAGNOSIS — R9431 Abnormal electrocardiogram [ECG] [EKG]: Secondary | ICD-10-CM | POA: Insufficient documentation

## 2021-08-27 DIAGNOSIS — R1013 Epigastric pain: Secondary | ICD-10-CM

## 2021-08-27 DIAGNOSIS — F1010 Alcohol abuse, uncomplicated: Secondary | ICD-10-CM | POA: Diagnosis present

## 2021-08-27 DIAGNOSIS — K5900 Constipation, unspecified: Secondary | ICD-10-CM | POA: Diagnosis present

## 2021-08-27 DIAGNOSIS — R1110 Vomiting, unspecified: Secondary | ICD-10-CM | POA: Diagnosis not present

## 2021-08-27 DIAGNOSIS — I10 Essential (primary) hypertension: Secondary | ICD-10-CM | POA: Diagnosis present

## 2021-08-27 DIAGNOSIS — R109 Unspecified abdominal pain: Secondary | ICD-10-CM | POA: Diagnosis not present

## 2021-08-27 DIAGNOSIS — G40909 Epilepsy, unspecified, not intractable, without status epilepticus: Secondary | ICD-10-CM | POA: Diagnosis present

## 2021-08-27 DIAGNOSIS — F332 Major depressive disorder, recurrent severe without psychotic features: Principal | ICD-10-CM | POA: Diagnosis present

## 2021-08-27 DIAGNOSIS — K219 Gastro-esophageal reflux disease without esophagitis: Secondary | ICD-10-CM | POA: Diagnosis present

## 2021-08-27 DIAGNOSIS — F17210 Nicotine dependence, cigarettes, uncomplicated: Secondary | ICD-10-CM | POA: Diagnosis present

## 2021-08-27 DIAGNOSIS — F10130 Alcohol abuse with withdrawal, uncomplicated: Secondary | ICD-10-CM | POA: Diagnosis not present

## 2021-08-27 DIAGNOSIS — F17200 Nicotine dependence, unspecified, uncomplicated: Secondary | ICD-10-CM | POA: Diagnosis not present

## 2021-08-27 DIAGNOSIS — T43212A Poisoning by selective serotonin and norepinephrine reuptake inhibitors, intentional self-harm, initial encounter: Secondary | ICD-10-CM | POA: Diagnosis present

## 2021-08-27 LAB — CBC
HCT: 40.6 % (ref 39.0–52.0)
Hemoglobin: 14.2 g/dL (ref 13.0–17.0)
MCH: 31.8 pg (ref 26.0–34.0)
MCHC: 35 g/dL (ref 30.0–36.0)
MCV: 90.8 fL (ref 80.0–100.0)
Platelets: 245 10*3/uL (ref 150–400)
RBC: 4.47 MIL/uL (ref 4.22–5.81)
RDW: 13.2 % (ref 11.5–15.5)
WBC: 8.9 10*3/uL (ref 4.0–10.5)
nRBC: 0 % (ref 0.0–0.2)

## 2021-08-27 LAB — COMPREHENSIVE METABOLIC PANEL
ALT: 26 U/L (ref 0–44)
AST: 34 U/L (ref 15–41)
Albumin: 3.7 g/dL (ref 3.5–5.0)
Alkaline Phosphatase: 81 U/L (ref 38–126)
Anion gap: 8 (ref 5–15)
BUN: 6 mg/dL — ABNORMAL LOW (ref 8–23)
CO2: 27 mmol/L (ref 22–32)
Calcium: 8.8 mg/dL — ABNORMAL LOW (ref 8.9–10.3)
Chloride: 104 mmol/L (ref 98–111)
Creatinine, Ser: 1.13 mg/dL (ref 0.61–1.24)
GFR, Estimated: >60 mL/min (ref >60)
Glucose, Bld: 100 mg/dL — ABNORMAL HIGH (ref 70–99)
Potassium: 3.9 mmol/L (ref 3.5–5.1)
Sodium: 139 mmol/L (ref 135–145)
Total Bilirubin: 1.2 mg/dL (ref 0.3–1.2)
Total Protein: 5.9 g/dL — ABNORMAL LOW (ref 6.5–8.1)

## 2021-08-27 LAB — HIV ANTIBODY (ROUTINE TESTING W REFLEX): HIV Screen 4th Generation wRfx: NONREACTIVE

## 2021-08-27 MED ORDER — LORAZEPAM 1 MG PO TABS
1.0000 mg | ORAL_TABLET | Freq: Four times a day (QID) | ORAL | Status: DC | PRN
  Administered 2021-08-27: 1 mg via ORAL
  Filled 2021-08-27: qty 1

## 2021-08-27 MED ORDER — NICOTINE 21 MG/24HR TD PT24
21.0000 mg | MEDICATED_PATCH | Freq: Every day | TRANSDERMAL | Status: DC
  Administered 2021-08-27 – 2021-08-31 (×5): 21 mg via TRANSDERMAL
  Filled 2021-08-27 (×7): qty 1

## 2021-08-27 MED ORDER — MAGNESIUM HYDROXIDE 400 MG/5ML PO SUSP
30.0000 mL | Freq: Every day | ORAL | Status: DC | PRN
  Administered 2021-08-31: 30 mL via ORAL
  Filled 2021-08-27: qty 30

## 2021-08-27 MED ORDER — FLUTICASONE FUROATE-VILANTEROL 200-25 MCG/INH IN AEPB
1.0000 | INHALATION_SPRAY | Freq: Every day | RESPIRATORY_TRACT | Status: DC
  Administered 2021-08-28: 1 via RESPIRATORY_TRACT
  Filled 2021-08-27: qty 28

## 2021-08-27 MED ORDER — LEVETIRACETAM 500 MG PO TABS: 1000.0000 mg | ORAL_TABLET | Freq: Two times a day (BID) | ORAL | Status: DC

## 2021-08-27 MED ORDER — LORAZEPAM 1 MG PO TABS
1.0000 mg | ORAL_TABLET | Freq: Once | ORAL | Status: AC
  Administered 2021-08-27: 1 mg via ORAL
  Filled 2021-08-27: qty 1

## 2021-08-27 MED ORDER — LOPERAMIDE HCL 2 MG PO CAPS: 2.0000 mg | ORAL_CAPSULE | ORAL | Status: DC | PRN

## 2021-08-27 MED ORDER — ACETAMINOPHEN 325 MG PO TABS
650.0000 mg | ORAL_TABLET | Freq: Four times a day (QID) | ORAL | Status: DC | PRN
Start: 2021-08-27 — End: 2021-09-03
  Administered 2021-08-28 – 2021-09-01 (×2): 650 mg via ORAL
  Filled 2021-08-27 (×2): qty 2

## 2021-08-27 MED ORDER — ADULT MULTIVITAMIN W/MINERALS CH
1.0000 | ORAL_TABLET | Freq: Every day | ORAL | Status: DC
  Administered 2021-08-28 – 2021-09-03 (×7): 1 via ORAL
  Filled 2021-08-27 (×10): qty 1

## 2021-08-27 MED ORDER — FAMOTIDINE 20 MG PO TABS
20.0000 mg | ORAL_TABLET | Freq: Every day | ORAL | Status: DC
  Administered 2021-08-28 – 2021-09-03 (×7): 20 mg via ORAL
  Filled 2021-08-27 (×9): qty 1

## 2021-08-27 MED ORDER — ASPIRIN EC 81 MG PO TBEC
81.0000 mg | DELAYED_RELEASE_TABLET | Freq: Every day | ORAL | Status: DC
  Administered 2021-08-28 – 2021-09-03 (×7): 81 mg via ORAL
  Filled 2021-08-27 (×10): qty 1

## 2021-08-27 MED ORDER — THIAMINE HCL 100 MG PO TABS
100.0000 mg | ORAL_TABLET | Freq: Every day | ORAL | Status: DC
  Administered 2021-08-28: 100 mg via ORAL
  Filled 2021-08-27 (×2): qty 1

## 2021-08-27 MED ORDER — THIAMINE HCL 100 MG PO TABS: 100.0000 mg | ORAL_TABLET | Freq: Every day | ORAL | Status: DC

## 2021-08-27 MED ORDER — ROSUVASTATIN CALCIUM 10 MG PO TABS
10.0000 mg | ORAL_TABLET | Freq: Every day | ORAL | Status: DC
  Administered 2021-08-28 – 2021-09-03 (×7): 10 mg via ORAL
  Filled 2021-08-27 (×9): qty 1

## 2021-08-27 MED ORDER — FOLIC ACID 1 MG PO TABS
1.0000 mg | ORAL_TABLET | Freq: Every day | ORAL | Status: DC
  Administered 2021-08-28 – 2021-09-03 (×7): 1 mg via ORAL
  Filled 2021-08-27 (×9): qty 1

## 2021-08-27 MED ORDER — HYDROXYZINE HCL 25 MG PO TABS: 25.0000 mg | ORAL_TABLET | Freq: Four times a day (QID) | ORAL | Status: DC | PRN

## 2021-08-27 MED ORDER — SUCRALFATE 1 G PO TABS
1.0000 g | ORAL_TABLET | Freq: Three times a day (TID) | ORAL | Status: DC
  Administered 2021-08-27 – 2021-09-03 (×27): 1 g via ORAL
  Filled 2021-08-27 (×37): qty 1

## 2021-08-27 MED ORDER — BUSPIRONE HCL 7.5 MG PO TABS
7.5000 mg | ORAL_TABLET | Freq: Every day | ORAL | Status: DC
  Administered 2021-08-28 – 2021-08-29 (×2): 7.5 mg via ORAL
  Filled 2021-08-27 (×3): qty 1

## 2021-08-27 MED ORDER — ALUM & MAG HYDROXIDE-SIMETH 200-200-20 MG/5ML PO SUSP
30.0000 mL | ORAL | Status: DC | PRN
Start: 2021-08-27 — End: 2021-09-03
  Filled 2021-08-27: qty 30

## 2021-08-27 MED ORDER — PANTOPRAZOLE SODIUM 40 MG PO TBEC
40.0000 mg | DELAYED_RELEASE_TABLET | Freq: Every day | ORAL | Status: DC
  Administered 2021-08-28 – 2021-09-03 (×7): 40 mg via ORAL
  Filled 2021-08-27 (×9): qty 1

## 2021-08-27 MED ORDER — HYDROXYZINE HCL 25 MG PO TABS: 25.0000 mg | ORAL_TABLET | Freq: Three times a day (TID) | ORAL | Status: DC | PRN

## 2021-08-27 MED ORDER — LEVETIRACETAM 500 MG PO TABS
1000.0000 mg | ORAL_TABLET | Freq: Two times a day (BID) | ORAL | Status: DC
  Administered 2021-08-27 – 2021-09-03 (×14): 1000 mg via ORAL
  Filled 2021-08-27 (×20): qty 2

## 2021-08-27 MED ORDER — TRAZODONE HCL 50 MG PO TABS
50.0000 mg | ORAL_TABLET | Freq: Every evening | ORAL | Status: DC | PRN
  Administered 2021-08-31 – 2021-09-02 (×4): 50 mg via ORAL
  Filled 2021-08-27 (×5): qty 1

## 2021-08-27 NOTE — ED Notes (Addendum)
[employer] called for safe transport to [facility] No available ETA per dispatch.

## 2021-08-27 NOTE — Discharge Summary (Addendum)
[facility] Discharge Summary  Patient name: Spencer Mendoza Medical record number: [id number] Date of birth: [birth year] Age: 60 y.o. Gender: male Date of Admission: 08/27/2021  Date of Discharge: 08/27/2021 Admitting Physician: Kerrie [place] Cinderella  Primary Care Provider: Elsie Stain, MD Consultants: Psych  Indication for Hospitalization: Intentional trazodone overdose  Discharge Diagnoses/Problem List:  Attempted Suicide Depression Anxiety Seizure disorder Migraines HTN CAD COPD GERD  Disposition: [facility]  Discharge Condition: Stable  Discharge Exam:   General: Awake, laying in bed, depressed mood Cardiovascular: RRR, No murmurs, normal S1/S2 Respiratory: CTAB, no wheezing or crackles  Abdomen: soft, no distension or tenderness Extremities: No edema on LE, +2 pedal and radial pulse  Brief Hospital Course:  Lord Lancour. Kren is a 60 year old male admitted for trazadone overdose in self harm attempt. PMH includes depression, anxiety, seizure disorder, migraines, HTN, CAD, GERD and COPD.  Patient came to ED from behavioral health and was involuntary committed for intentional ingestion of 15 tablets of 100 mg trazodone which is one of his prescribed medications.  Patient complained of nausea with vomiting,  severe abdominal pain and migraine headache upon exam.  In ED, EKG showed prolonged QTC at 565, potassium and magnesium WNL, ethanol level negative. However given prolonged QTC, per poison control patient received IV potassium and IV magnesium. 4 hour post ingestion tylenol level and salicylate level WNL. UDS was negative as well.   Patient was admitted to the [facility] for observation and close monitoring of his prolonged Qtc. He was managed with Compazine 5 mg every 6 hours, Tylenol PRN and MiraLAX with routine vital monitoring.  Repeat EKG showed NSR with normal QTC of 457 and patient at this time was cleared  medically. Psych was consulted and patient transferred to [facility].   Issues for Follow Up:  Follow up with [facility] recommendations    Significant Procedures: None   Significant Labs and Imaging:  Recent Labs  Lab 08/26/21 1208 08/27/21 0443  WBC 10.4 8.9  HGB 16.3 14.2  HCT 46.7 40.6  PLT 273 245   Recent Labs  Lab 08/26/21 1208 08/27/21 0443  NA 139 139  K 3.8 3.9  CL 100 104  CO2 26 27  GLUCOSE 101* 100*  BUN 6* 6*  CREATININE 0.92 1.13  CALCIUM 9.7 8.8*  MG 1.9  --   ALKPHOS 80 81  AST 30 34  ALT 30 26  ALBUMIN 4.4 3.7      Results/Tests Pending at Time of Discharge: None   Discharge Medications:  Allergies as of 08/27/2021       Reactions   Sertraline Other (See Comments)   Erectile dysfunction   Codeine Nausea And Vomiting   Depakote [divalproex Sodium] Other (See Comments)   Made patient shake   Migranal [dihydroergotamine] Nausea And Vomiting   Topamax [topiramate] Other (See Comments)   Made patient angry          Discharge Instructions: Please refer to Patient Instructions section of EMR for full details.  Patient was counseled important signs and symptoms that should prompt return to medical care, changes in medications, dietary instructions, activity restrictions, and follow up appointments.      Alen Bleacher, MD 08/27/2021, 6:50 PM PGY-1, [place] Upper-Level Resident Addendum   I have independently interviewed and examined the patient. I have discussed the above with Dr. Adah Salvage and agree with the documentation. My edits for correction/addition/clarification are included above. Please  see any attending notes.   Alcus Dad, MD PGY-2, [place]

## 2021-08-27 NOTE — BHH Group Notes (Signed)
Pt did not attend wrap up group this evening. Pt was in their room.  
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
## 2021-08-27 NOTE — Progress Notes (Addendum)
Family Medicine Teaching Service Daily Progress Note Intern Pager: [phone]  Patient name: Spencer Mendoza Medical record number: [id number] Date of birth: Nov 20, 1960 Age: 60 y.o. Gender: male  Primary Care Provider: Elsie Stain, MD Consultants: Psych Code Status: Full  Pt Overview and Major Events to Date:  10/10 admitted   Assessment and Plan:  Spencer Mendoza is a 60 year old male with hx of depression and substance abuse admitted for Trazadone overdose in self harm attempt. Other PMH include HTN, CAD, and COPD.  Trazodone Overdose  Attempted Self-Harm Prolonged QTc EKG showed NSR with Qtc of 457 this morning and stable from 565 on admission. Poison control has cleared patient. He is medically stable and ready to be signed off to psych. On exam patient is alert and oriented. He still has migraine with photosensitivity.  -Psych following, appreciate Recs -Poison control following -Continue neurochecks every 4 hours -Compazine 5 mg every 6 hours -Daily MiraLAX 17g PRN -Continue routine vitals -Tylenol 650 every 6 hours   Depression and Anxiety  Patient reported feeling depressed and anxious about his migraine and seizure. His currently medically cleared with normal QTc. Will defer care to psych. Home medication include BuSpar 7.5 mg daily. -Continue BuSpar 7.5 mg daily -Psych will be seeing patient this morning  Hx of alcohol use order  Patient with extensive history of alcohol abuse.  He reports his last drink was yesterday before admission.  He had 2 beers.  His home medication includes thiamine 147 mg daily and folic acid 1 mg daily -Continue home medication of thiamine and folic acid -Continue CIWA protocol  Seizure Disorder  Takes home medication of Keppra 1000 mg twice daily.  Last seizure was couple months ago. -Continue Keppra 1000 mg twice daily  COPD Stable. No signs of exacerbation at this time.  Home medication include albuterol as needed and Breo  Ellipta. -Holding albuterol for prolonged QTC -Continue Breo Ellipta 1 puff daily  CAD On aspirin 81mg  and Crestor 10 mg daily for home medication. -Continue Crestor 10 mg daily -Continue Aspirin 81 mg daily  GERD On home medication of Pepcid, Protonix and Carafate -Continue Protonix 40mg  daily -Continue Pepsid 20 mg daily -Continue Carafate 1 g 3 times daily with meals and at bedtime    FEN/GI: [place] healthy diet PPx: Heparin Dispo: IVC deferred to psych.    Subjective:  NAEON. This morning patient said he is feeling better, more awake. Still endorses abdominal pain and severe migraine. Still feeling depressed and anxious about his migraine and seizure.  Objective: Temp:  [98.6 F (37 C)-99.4 F (37.4 C)] 99.4 F (37.4 C) (10/11 0045) Pulse Rate:  [57-109] 57 (10/11 0630) Resp:  [13-28] 19 (10/11 0630) BP: (96-167)/(54-102) 123/76 (10/11 0630) SpO2:  [92 %-99 %] 93 % (10/11 0630) Physical Exam: General: Awake, laying in bed, depressed mood Cardiovascular: RRR, No murmurs, normal S1/S2 Respiratory: CTAB, no wheezing or crackles  Abdomen: soft, no distension or tenderness Extremities: No edema on LE, +2 pedal and radial pulse   Laboratory: Recent Labs  Lab 08/26/21 1208 08/27/21 0443  WBC 10.4 8.9  HGB 16.3 14.2  HCT 46.7 40.6  PLT 273 245   Recent Labs  Lab 08/26/21 1208 08/27/21 0443  NA 139 139  K 3.8 3.9  CL 100 104  CO2 26 27  BUN 6* 6*  CREATININE 0.92 1.13  CALCIUM 9.7 8.8*  PROT 7.1 5.9*  BILITOT 0.6 1.2  ALKPHOS 80 81  ALT 30 26  AST 30 34  GLUCOSE 101* 100*      Imaging/Diagnostic Tests: No new images   Alen Bleacher, MD 08/27/2021, 6:46 AM PGY-1, [place] Intern pager: [phone], text pages welcome

## 2021-08-27 NOTE — ED Notes (Signed)
Called poison control, spoke with Ebony Hail. Per Poison control, okay to send patient to Mental Health.

## 2021-08-27 NOTE — ED Notes (Signed)
[facility] here to take pt to [facility]. Pt's sister Katharine Look notified. Pt's belonging from security and purple zone handed to [employer]. IVC paper with [employer].

## 2021-08-27 NOTE — ED Notes (Signed)
RN spoke with poison control. EKG showed NSR. Poison control advises to keep monitoring until he returns back to his baseline.

## 2021-08-27 NOTE — Tx Team (Signed)
Initial Treatment Plan 08/27/2021 4:01 PM Spencer Mendoza YHO:[id number]    PATIENT STRESSORS: Financial difficulties   Health problems   Marital or family conflict   Medication change or noncompliance     PATIENT STRENGTHS: Ability for insight  Communication skills  Supportive family/friends    PATIENT IDENTIFIED PROBLEMS: si  anxiety  depression  hopelessness               DISCHARGE CRITERIA:  Ability to meet basic life and health needs Improved stabilization in mood, thinking, and/or behavior Medical problems require only outpatient monitoring Need for constant or close observation no longer present  PRELIMINARY DISCHARGE PLAN: Attend aftercare/continuing care group Outpatient therapy Placement in alternative living arrangements Return to previous living arrangement  PATIENT/FAMILY INVOLVEMENT: This treatment plan has been presented to and reviewed with the patient, Spencer Mendoza.  The patient and family have been given the opportunity to ask questions and make suggestions.  Baron Sane, RN 08/27/2021, 4:01 PM

## 2021-08-27 NOTE — Hospital Course (Signed)
Spencer Mendoza. Sanks is a 60 year old male with hx of depression and substance abuse admitted for Trazadone overdose in self harm attempt. Other PMH include HTN, CAD, and COPD.  Patient came to ED from behavioral health and was involuntary committed for intentional ingestion of 15 tablets of 100 mg trazodone which is one of his prescribed medications.  Patient complained of nausea with vomiting,  severe abdominal pain and migraine headache upon exam.  In ED, EKG showed prolonged QTC at 565, potassium and magnesium WNL. However given prolonged QTC, per poison control patient received IV potassium and IV magnesium.  In the ED, 4 hour post ingestion tylenol level, salicylate level WNL and was negative for ethanol. UDS was negative as well.   Patient was admitted to the [facility] for observation and close monitoring of his prolonged Qtc. At this time he was managed with Compazine 5 mg every 6 hours, Tylenol PRN and MiraLAX with routine vital monitoring.  Repeat EKG showed NSR with normal QTC of 457 and patient at this time was cleared medically. Psych was consulted who took over patient's care.

## 2021-08-27 NOTE — Progress Notes (Addendum)
Pt accepted to [facility] rm 306-1    Patient meets inpatient criteria per Beatriz Stallion, NP.   The attending provider will be Melba Coon, MD  Call report to [phone]    Pearline Cables @ [facility] ED notified via secure chat.     Pt scheduled  to arrive at [facility] at 1330 PM pending IVC paperwork being faxed to [phone].  Signed:  Durenda Hurt, MSW, [place], LCASA 08/27/2021 12:32 PM

## 2021-08-27 NOTE — ED Notes (Signed)
Pt's sister Katharine Look at the bedside. Informed sister that since pt is IVCd, no visitors are allowed. Given sister 5 mins to talk to patient and say goodbye. Sister verbalized understanding, voluntarily left purse at the nurse's station prior to entering the pt's room again.

## 2021-08-27 NOTE — Consult Note (Signed)
[facility] New Psychiatric Evaluation   Service Date: August 27, 2021 LOS:  LOS: 1 day    Assessment  Spencer Mendoza is a 60 y.o. male admitted medically for 08/26/2021 11:49 AM for a suicide attempt. He carries the psychiatric diagnoses of MDD and EtOH use disorder and has a past medical history most significant for seizures and migraines.Psychiatry was consulted for risk stratification after a suicide attempt by Clabe Seal, MD.    His current presentation of a suicide attempt in the setting of hopelessness, depressed mood, anhedonia, etc is most consistent with known diagnosis of MDD. He is diagnosed with bipolar disorder in some places in his chart; brief mania screen was equivocal and this will need to be further investigated on the inpatient psychiatric unit (pt with significant EtOH history clouding picture).  Current outpatient psychotropic medications include trazodone, xanax, and buspirone historically he has had a poor response to these medications. He was compliant with medications prior to admission as evidenced by fill history. On initial examination, patient expressed that he wished his suicide had been completed; he is not willing to go to inpatient psychiatry and will need to be IVC'd. Please see plan below for detailed recommendations.   Diagnoses:  Active Hospital problems: Active Problems:   Suicide attempt by drug ingestion, initial encounter [facility])    Problems edited/added by me: No problems updated.  Plan  ## Safety and Observation Level:  - Based on my clinical evaluation, I estimate the patient to be at high risk of self harm in the current setting - At this time, we recommend a 1:1 level of observation. This decision is based on my review of the chart including patient's history and current presentation, interview of the patient, mental status examination, and consideration of suicide risk including evaluating suicidal ideation,  plan, intent, suicidal or self-harm behaviors, risk factors, and protective factors. This judgment is based on our ability to directly address suicide risk, implement suicide prevention strategies and develop a safety plan while the patient is in the clinical setting. Please contact our team if there is a concern that risk level has changed.  ## Medications:  -- defer to inpt psychiatry, would consider mirtazapine (sleep, depression, less sexual side effects than SSRI)   ## Medical Decision Making Capacity:  Not formally assessed  ## Further Work-up:  -- defer to inpt psych, would consider TSH, B12, folate  ## Disposition:  -- to inpt psych   Thank you for this consult request. Recommendations have been communicated to the primary team.  We will sign off at this time as pt has been accepted to [facility] history  Relevant Aspects of Hospital Course:  Admitted on 08/26/2021 for an overdose on trazodone. Was admitted for prolonged Qtc which has since normalized.  Patient Report:  Patient seen in ED; he is alert and oriented but does not participate in formal attention testing. Some speech latency is noted through interview.  States his mood is "all right". States that "I was born with epilepsy, depression and anxiety" as his reason for being here. When asked what prompted his suicide attempt, states "I wanted to do it all my damn life" because there is nothing his doctors can do for his pain. He dreads waking up because he doesn't know if he is going to have a seizure, a panic attack (feels like he will never breathe again), or a migraine (intractible vomiting). He cites his  dog as his main  protective factor; believes the dog is being taken care of by his exgirlfriend who left him 2 months ago; he states "things had been bad for a while" and that this is a mutual breakup. He is also religious.   Having migraines 3-5x/week, only thing that has ever worked is  botox which his insurance stopped covering. Has been told he needs to go to a pain clinic but he doesn't want to be "narcotic'd up" because he has "done too much dope" in his life.   Generally panic attacks work in winter, frequency difficult to ascertain.   Asks when he can go home shortly after telling this [facility] Curia he wishes his attempt had worked. Discussed treatment options briefly - had not been on mirtazapine, may be helpful for sleep, depression, anxiety, less likely to tip into mania if he is bipolar, etc. When I brought up it has less sexual side effect than zoloft (which is listed as an allergy for this reason), pt stated that "I don't care about that anymore".   Tried to do mania screen - he does have periods of being up for a few days with increased irritability and impulsivity, mood is "a ball of nerves". Denies flight of ideas, grandiosity, and racing thoughts during these time periods.   No access to guns  ROS:  (+) HA, nausea, feels like one of his normal migraines .Otherwise denied full ROS.   Collateral information:  Not obtained  Psychiatric History:  Information collected from pt, chart  Family psych history: unknown  Medical History: Past Medical History:  Diagnosis Date   Alcohol withdrawal ([place]) 07/29/2013   Allergy    Anxiety    Arthritis    Benzodiazepine abuse ([place]) 08/30/2019   Bipolar disorder ([place])    CAD (coronary artery disease)    Chronic insomnia 06/06/2015   Chronic migraine without aura, with intractable migraine, so stated, with status migrainosus 08/25/2019   Claustrophobia    Cocaine abuse ([place]) 05/22/2015   COPD (chronic obstructive pulmonary disease) ([place])    Delirium tremens ([place]) 07/29/2013   Depression    Epilepsy ([place])    last seizure week of 06-25-2020- sev. grand mal seizures per pt    Hemiplegic migraine with status migrainosus 06/06/2015   Hypertension    Migraine    Opioid use disorder 08/30/2019   PONV (postoperative nausea  and vomiting)    Stroke [facility])    patient denies- states had a hemiplegic migraine in ~2011   Substance abuse ([place])     Surgical History: Past Surgical History:  Procedure Laterality Date   ANKLE SURGERY     in high school   APPENDECTOMY     COLONOSCOPY     KNEE ARTHROSCOPY Right    NOSE SURGERY      Medications:   Current Facility-Administered Medications:    acetaminophen (TYLENOL) tablet 650 mg, 650 mg, Oral, Q6H PRN **OR** acetaminophen (TYLENOL) suppository 650 mg, 650 mg, Rectal, Q6H PRN, Precious Gilding, DO   aspirin EC tablet 81 mg, 81 mg, Oral, Daily, Precious Gilding, DO, 81 mg at 08/27/21 0841   busPIRone (BUSPAR) tablet 7.5 mg, 7.5 mg, Oral, Daily, Ronnald Ramp, Sarah, DO, 7.5 mg at 08/27/21 0846   famotidine (PEPCID) tablet 20 mg, 20 mg, Oral, Daily, Ronnald Ramp, Sarah, DO, 20 mg at 08/27/21 0841   fluticasone furoate-vilanterol (BREO ELLIPTA) 200-25 MCG/INH 1 puff, 1 puff, Inhalation, Daily, Precious Gilding, DO, 1 puff at 46/27/03 5009   folic acid (FOLVITE) tablet  1 mg, 1 mg, Oral, Daily, Precious Gilding, DO, 1 mg at 08/27/21 0841   heparin injection 5,000 Units, 5,000 Units, Subcutaneous, Q8H, Precious Gilding, DO, 5,000 Units at 08/27/21 0843   levETIRAcetam (KEPPRA) IVPB 1000 mg/100 mL premix, 1,000 mg, Intravenous, Q12H, Precious Gilding, DO, Last Rate: 400 mL/hr at 08/27/21 0843, 1,000 mg at 08/27/21 0843   pantoprazole (PROTONIX) EC tablet 40 mg, 40 mg, Oral, Daily, Precious Gilding, DO, 40 mg at 08/27/21 0841   polyethylene glycol (MIRALAX / GLYCOLAX) packet 17 g, 17 g, Oral, Daily PRN, Precious Gilding, DO   prochlorperazine (COMPAZINE) injection 5 mg, 5 mg, Intravenous, Q6H PRN, Precious Gilding, DO, 5 mg at 08/26/21 2342   rosuvastatin (CRESTOR) tablet 10 mg, 10 mg, Oral, Daily, Precious Gilding, DO, 10 mg at 08/27/21 0841   sucralfate (CARAFATE) tablet 1 g, 1 g, Oral, TID WC & HS, Precious Gilding, DO, 1 g at 08/27/21 0841   thiamine tablet 100 mg, 100 mg, Oral, Daily, Precious Gilding, DO, 100 mg at 08/27/21  [id number]  Current Outpatient Medications:    albuterol (VENTOLIN HFA) 108 (90 Base) MCG/ACT inhaler, INHALE 2 PUFFS BY MOUTH INTO THE LUNGS EVERY 4 HOURS AS NEEDED FOR SHORTNESS OF BREATH OR WHEEZING, Disp: 8.5 g, Rfl: 3   aspirin EC 81 MG tablet, Take 1 tablet (81 mg total) by mouth daily. Swallow whole., Disp: 30 tablet, Rfl: 11   budesonide-formoterol (SYMBICORT) 160-4.5 MCG/ACT inhaler, Inhale 2 puffs into the lungs 2 (two) times daily., Disp: 10.2 g, Rfl: 5   busPIRone (BUSPAR) 7.5 MG tablet, TAKE 1 TABLET(7.5 MG) BY MOUTH DAILY, Disp: 30 tablet, Rfl: 1   famotidine (PEPCID) 20 MG tablet, TAKE 1 TABLET(20 MG) BY MOUTH AT BEDTIME, Disp: 90 tablet, Rfl: 1   folic acid (FOLVITE) 1 MG tablet, Take 1 tablet (1 mg total) by mouth daily., Disp: 30 tablet, Rfl: 3   levETIRAcetam (KEPPRA) 1000 MG tablet, Take 1 tablet (1,000 mg total) by mouth 2 (two) times daily., Disp: 60 tablet, Rfl: 11   ondansetron (ZOFRAN ODT) 4 MG disintegrating tablet, Dissolve 1 tablet every 4 hours as needed for nausea/vomit, Disp: 14 tablet, Rfl: 0   pantoprazole (PROTONIX) 40 MG tablet, TAKE 1 TABLET(40 MG) BY MOUTH DAILY 30 MINUTES BEFORE BREAKFAST, Disp: 90 tablet, Rfl: 1   rosuvastatin (CRESTOR) 10 MG tablet, Take 1 tablet (10 mg total) by mouth daily., Disp: 90 tablet, Rfl: 3   sucralfate (CARAFATE) 1 g tablet, Take 1 tablet (1 g total) by mouth 4 (four) times daily -  with meals and at bedtime., Disp: 21 tablet, Rfl: 0   thiamine 100 MG tablet, Take 1 tablet (100 mg total) by mouth daily., Disp: 30 tablet, Rfl: 3   traZODone (DESYREL) 100 MG tablet, TAKE 2 TABLETS(200 MG) BY MOUTH AT BEDTIME, Disp: 60 tablet, Rfl: 2  Allergies: Allergies  Allergen Reactions   Sertraline Other (See Comments)    Erectile dysfunction   Codeine Nausea And Vomiting   Depakote [Divalproex Sodium] Other (See Comments)    Made patient shake   Migranal [Dihydroergotamine] Nausea And Vomiting   Topamax [Topiramate] Other (See Comments)     Made patient angry    Social History:  Lives with ex girlfriend  Tobacco use: yes Alcohol use: drinks 3-5 days/month, maximum 6 pack, no hx withdrawal Drug use: denied all other licit and illicit drugs  Family History:  The patient's family history includes Cancer in his cousin and father; Colon cancer in his cousin; Colon cancer (  age of onset: 64) in his sister; Epilepsy in his mother; Heart disease in his father and mother; Heart failure in his mother; Hypertension in his father; Melanoma in his father; Migraines in his father; Stroke in his mother.    Objective  Vital signs:  Temp:  [98.3 F (36.8 C)-99.4 F (37.4 C)] 99.4 F (37.4 C) (10/11 0045) Pulse Rate:  [57-109] 57 (10/11 0630) Resp:  [13-28] 19 (10/11 0630) BP: (96-167)/(54-105) 123/76 (10/11 0630) SpO2:  [92 %-99 %] 93 % (10/11 0630)  Physical Exam: Gen: Lying in bed in dark room, winces when lights turned on Pulm: no increased WOB Neuro: CNII-XII grossly intact Psych: alert, oriented    Mental Status Exam:   08/27/21 1258  Presentation  General Appearance Appropriate for Environment  Eye Contact Fair  Speech Slow  Speech Volume Normal  Mood and Affect  Mood Hopeless  Affect Depressed  Thought Processes  Thought Process Coherent  Descriptions of Associations Intact  Orientation Full (Time, Place and Person)  Thought Content Other (comment) (devoid of active SI (does wish he was dead), HI, delusions or paranoia)  Hallucinations None  Ideas of Reference None  Suicidal Thoughts Yes, Passive  SI Passive Intent and/or Plan Without Intent  Homicidal Thoughts No  Sensorium  Memory Immediate Good;Recent Good  Judgment Poor  Insight Poor  Executive Functions  Concentration Fair  Attention Span Fair (did not participate in formal testing)  Psychomotor Activity  Psychomotor Activity  (mild psychomotor slowing)  Assets  Assets  (labrador)  Sleep  Sleep Poor

## 2021-08-28 ENCOUNTER — Telehealth (HOSPITAL_COMMUNITY): Payer: Self-pay | Admitting: Critical Care Medicine

## 2021-08-28 ENCOUNTER — Encounter (HOSPITAL_COMMUNITY): Payer: Self-pay

## 2021-08-28 DIAGNOSIS — F332 Major depressive disorder, recurrent severe without psychotic features: Principal | ICD-10-CM

## 2021-08-28 LAB — TSH: TSH: 1.898 u[IU]/mL (ref 0.350–4.500)

## 2021-08-28 LAB — LIPID PANEL
Cholesterol: 142 mg/dL (ref 0–200)
HDL: 66 mg/dL (ref >40)
LDL Cholesterol: 58 mg/dL (ref 0–99)
Total CHOL/HDL Ratio: 2.2 RATIO
Triglycerides: 91 mg/dL (ref <150)
VLDL: 18 mg/dL (ref 0–40)

## 2021-08-28 MED ORDER — THIAMINE HCL 100 MG/ML IJ SOLN
100.0000 mg | Freq: Once | INTRAMUSCULAR | Status: DC
Start: 2021-08-28 — End: 2021-09-01
  Filled 2021-08-28: qty 2

## 2021-08-28 MED ORDER — VENLAFAXINE HCL ER 37.5 MG PO CP24
37.5000 mg | ORAL_CAPSULE | Freq: Every day | ORAL | Status: DC
  Administered 2021-08-28 – 2021-08-29 (×2): 37.5 mg via ORAL
  Filled 2021-08-28 (×5): qty 1

## 2021-08-28 MED ORDER — CHLORDIAZEPOXIDE HCL 25 MG PO CAPS
25.0000 mg | ORAL_CAPSULE | Freq: Four times a day (QID) | ORAL | Status: DC | PRN
Start: 2021-08-28 — End: 2021-08-28

## 2021-08-28 MED ORDER — CHLORDIAZEPOXIDE HCL 25 MG PO CAPS: 25.0000 mg | ORAL_CAPSULE | Freq: Four times a day (QID) | ORAL | Status: DC | PRN

## 2021-08-28 MED ORDER — HYDROXYZINE HCL 25 MG PO TABS: 25.0000 mg | ORAL_TABLET | Freq: Four times a day (QID) | ORAL | Status: AC | PRN

## 2021-08-28 MED ORDER — GABAPENTIN 300 MG PO CAPS
300.0000 mg | ORAL_CAPSULE | Freq: Three times a day (TID) | ORAL | Status: DC
  Administered 2021-08-28: 300 mg via ORAL
  Filled 2021-08-28 (×7): qty 1

## 2021-08-28 MED ORDER — MIRTAZAPINE 7.5 MG PO TABS
7.5000 mg | ORAL_TABLET | Freq: Every day | ORAL | Status: DC
  Administered 2021-08-28: 7.5 mg via ORAL
  Filled 2021-08-28 (×3): qty 1

## 2021-08-28 MED ORDER — THIAMINE HCL 100 MG PO TABS: 100.0000 mg | ORAL_TABLET | Freq: Every day | ORAL | Status: DC

## 2021-08-28 MED ORDER — LOPERAMIDE HCL 2 MG PO CAPS: 2.0000 mg | ORAL_CAPSULE | ORAL | Status: AC | PRN

## 2021-08-28 MED ORDER — THIAMINE HCL 100 MG PO TABS
100.0000 mg | ORAL_TABLET | Freq: Every day | ORAL | Status: DC
Start: 2021-08-29 — End: 2021-09-03
  Administered 2021-08-29 – 2021-09-03 (×6): 100 mg via ORAL
  Filled 2021-08-28 (×8): qty 1

## 2021-08-28 MED ORDER — CHLORDIAZEPOXIDE HCL 25 MG PO CAPS: 25.0000 mg | ORAL_CAPSULE | Freq: Every day | ORAL | Status: DC

## 2021-08-28 MED ORDER — LORAZEPAM 0.5 MG PO TABS
0.5000 mg | ORAL_TABLET | Freq: Every day | ORAL | Status: DC | PRN
  Administered 2021-08-29 – 2021-09-01 (×5): 0.5 mg via ORAL
  Filled 2021-08-28 (×5): qty 1

## 2021-08-28 MED ORDER — CHLORDIAZEPOXIDE HCL 25 MG PO CAPS
25.0000 mg | ORAL_CAPSULE | ORAL | Status: DC
Start: 2021-08-30 — End: 2021-08-28

## 2021-08-28 MED ORDER — CHLORDIAZEPOXIDE HCL 25 MG PO CAPS
25.0000 mg | ORAL_CAPSULE | Freq: Three times a day (TID) | ORAL | Status: DC
Start: 2021-08-29 — End: 2021-08-28

## 2021-08-28 MED ORDER — CHLORDIAZEPOXIDE HCL 25 MG PO CAPS
25.0000 mg | ORAL_CAPSULE | Freq: Four times a day (QID) | ORAL | Status: DC
Start: 2021-08-28 — End: 2021-08-28
  Administered 2021-08-28: 25 mg via ORAL
  Filled 2021-08-28: qty 1

## 2021-08-28 NOTE — Plan of Care (Signed)
Nurse discussed coping skills with patient.  

## 2021-08-28 NOTE — B[facility] Group Notes (Signed)
Adult Psychoeducational Group Note  Date:  08/28/2021 Time:  10:51 AM  Group Topic/Focus:  Goals Group:   The focus of this group is to help patients establish daily goals to achieve during treatment and discuss how the patient can incorporate goal setting into their daily lives to aide in recovery.  Participation Level:  Active  Participation Quality:  Appropriate  Modes of Intervention:  Discussion  Additional Comments:  Pt has a goal today of continuing to cut the toxic things out of his life and focus on his self esteem.   Spencer Mendoza 08/28/2021, 10:51 AM

## 2021-08-28 NOTE — BHH Group Notes (Signed)
[facility] Group Notes:  (Nursing/MHT/Case Management/Adjunct)  Date:  08/28/2021  Time:  8:48 PM  Type of Therapy:   wrap up group  Participation Level:  Did Not Attend  Participation Quality:   na  Affect:   na  Cognitive:   na  Insight:  None  Engagement in Group:   na  Modes of Intervention:   na  Summary of Progress/Problems:  Spencer Mendoza 08/28/2021, 8:48 PM
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
## 2021-08-28 NOTE — BH Assessment (Signed)
Care Management - Follow Up [facility] Discharges   Patient has been placed in an inpatient psychiatric hospital at [facility]) on 08-27-2021.

## 2021-08-28 NOTE — Group Note (Signed)
LCSW Group Therapy Note  Group Date: 08/28/2021 Start Time: 1300 End Time: 1400   Type of Therapy and Topic:  Group Therapy: Using "I" Statements  Participation Level:  Did Not Attend  Description of Group:  Patients were asked to provide details of some interpersonal conflicts they have experienced. Patients were then educated about "I" statements, communication which focuses on feelings or views of the speaker rather than what the other person is doing. T group members were asked to reflect on past conflicts and to provide specific examples for utilizing "I" statements.  Therapeutic Goals:  Patients will verbalize understanding of ineffective communication and effective communication. Patients will be able to empathize with whom they are having conflict. Patients will practice effective communication in the form of "I" statements.    Summary of Patient Progress:  Did not attend   Therapeutic Modalities:   Cognitive Behavioral Therapy Solution-Focused Therapy    Darleen Crocker, LCSW 08/28/2021  2:03 PM

## 2021-08-28 NOTE — Progress Notes (Signed)
   08/27/21 2125  Psych Admission Type (Psych Patients Only)  Admission Status Involuntary  Psychosocial Assessment  Patient Complaints Anxiety;Hopelessness;Depression  Eye Contact Fair  Facial Expression Anxious;Pained  Affect Anxious;Depressed  Speech Logical/coherent  Interaction Assertive  Motor Activity Slow;Unsteady;Fidgety  Appearance/Hygiene In scrubs;Disheveled  Behavior Characteristics Cooperative;Anxious;Fidgety  Mood Depressed;Sad  Thought Pension scheme manager thinking  Content Blaming others;Blaming self  Delusions None reported or observed  Perception WDL  Hallucination None reported or observed  Judgment Poor  Confusion None  Danger to Self  Current suicidal ideation? Denies  Self-Injurious Behavior No self-injurious ideation or behavior indicators observed or expressed   Danger to Others  Danger to Others None reported or observed

## 2021-08-28 NOTE — [facility] Suicide Risk Assessment (Signed)
[facility] Admission Suicide Risk Assessment   Nursing information obtained from:  Patient Demographic factors:  Caucasian, Unemployed, Low socioeconomic status Current Mental Status:  Suicidal ideation indicated by patient, Plan includes specific time, place, or method, Intention to act on suicide plan, Self-harm thoughts, Belief that plan would result in death, Suicide plan Loss Factors:  Decline in physical health Historical Factors:  Prior suicide attempts, Impulsivity Risk Reduction Factors:  Sense of responsibility to family, Positive social support, Living with another person, especially a relative, Positive therapeutic relationship  Total Time spent with patient: 45 minutes Principal Problem: <principal problem not specified> Diagnosis:  Active Problems:   MDD (major depressive disorder), recurrent severe, without psychosis ([place])  Subjective Data:  Mr. Dowland is a 60 yr old male with a PPHx significant for MDD and EtOH use disorder he was admitted under IVC for a suicide attempt via overdose on Trazodone.  Patient reports multiple stressors contributing to worsening depressed mood and onset of suicidal thoughts.  Patient reports feeling overwhelmed by life long depression, and poor self-esteem due to epilepsy.  Patient reports that he has suffering due to crushing depression.  Patient reports feeling strong sense of anxiety and worry, that led up to suicide attempt.  Patient reports feeling hopeless, helpless, worthless.   Reports drinking 2 alcoholic teas, prior to attempt.  Patient otherwise denies drinking alcohol regularly.  He also smokes tobacco and uses marijuana. He reports history of suicide attempt.  Patient reports current home medications are not effective for treating his psychiatric symptoms.  Patient agreeable to medication changes while in the hospital.   Continued Clinical Symptoms:  Alcohol Use Disorder Identification Test Final Score (AUDIT): 0 The "Alcohol Use  Disorders Identification Test", Guidelines for Use in Primary Care, Second Edition.  World Pharmacologist [facility]). Score between 0-7:  no or low risk or alcohol related problems. Score between 8-15:  moderate risk of alcohol related problems. Score between 16-19:  high risk of alcohol related problems. Score 20 or above:  warrants further diagnostic evaluation for alcohol dependence and treatment.   CLINICAL FACTORS:   Severe Anxiety and/or Agitation Panic Attacks Depression:   Anhedonia Hopelessness Impulsivity Insomnia Epilepsy   Musculoskeletal: Strength & Muscle Tone: within normal limits Gait & Station: normal Patient leans: N/A  Psychiatric Specialty Exam:  Presentation  General Appearance: Disheveled; Appropriate for Environment  Eye Contact:Fleeting  Speech:Slow  Speech Volume:Decreased  Handedness:Right   Mood and Affect  Mood:Anxious; Depressed; Dysphoric; Hopeless  Affect:Congruent; Depressed; Tearful   Thought Process  Thought Processes:Linear  Descriptions of Associations:Intact  Orientation:Full (Time, Place and Person)  Thought Content:Logical  History of Schizophrenia/Schizoaffective disorder:No  Duration of Psychotic Symptoms:No data recorded Hallucinations:Hallucinations: None  Ideas of Reference:None  Suicidal Thoughts:Suicidal Thoughts: Yes, Passive SI Active Intent and/or Plan: Without Intent; Without Plan SI Passive Intent and/or Plan: Without Intent  Homicidal Thoughts:Homicidal Thoughts: No   Sensorium  Memory:Immediate Good; Recent Good; Remote Good  Judgment:Poor  Insight:Poor   Executive Functions  Concentration:Poor  Attention Span:Fair  [place]   Psychomotor Activity  Psychomotor Activity:Psychomotor Activity: Normal   Assets  Assets:-- Radiographer, therapeutic)   Sleep  Sleep:Sleep: Poor    Physical Exam: Physical Exam see H&P ROS see H&P Blood pressure  136/88, pulse 77, temperature 98.9 F (37.2 C), resp. rate 18, height 6' (1.829 m), weight 68.9 kg, SpO2 99 %. Body mass index is 20.61 kg/m.   COGNITIVE FEATURES THAT CONTRIBUTE TO RISK:  Thought constriction (tunnel vision)  SUICIDE RISK:   Moderate:  Frequent suicidal ideation with limited intensity, and duration, some specificity in terms of plans, no associated intent, good self-control, limited dysphoria/symptomatology, some risk factors present, and identifiable protective factors, including available and accessible social support.  PLAN OF CARE:   ASSESSMENT:  Diagnoses / Active Problems: Major depressive disorder, severe, recurrent, without psychotic features Drugs anxiety disorder with panic # PTSD Agoraphobia REM sleep disorder by history Rule out alcohol use disorder  PLAN: Safety and Monitoring:  -- Voluntary admission to inpatient psychiatric unit for safety, stabilization and treatment  -- Daily contact with patient to assess and evaluate symptoms and progress in treatment  -- Patient's case to be discussed in multi-disciplinary team meeting  -- Observation Level : q15 minute checks  -- Vital signs:  q12 hours  -- Precautions: suicide, elopement, and assault  2. Psychiatric Diagnoses and Treatment:    Start venlafaxine 37.5 mg once daily.  Titrate during hospitalization.  For mood and anxiety. Start mirtazapine 7.5 mg at bedtime.  For mood and anxiety.  He can titrate during hospitalization. We discussed starting gabapentin, but the patient reports he had adverse reaction to this in the past. Continue BuSpar for now.  We will either dose more frequently, change the dose, or stop this medication during hospitalization. Continue trazodone as needed Start Ativan 0.5 mg as needed for panic attacks.  Patient uses Xanax as needed at home for panic attacks, reports using only 2-3 doses per month, and this is effective for reporting panic attack symptoms. Continue  other medications including antiepileptic medication regimen.  --  The risks/benefits/side-effects/alternatives to this medication were discussed in detail with the patient and time was given for questions. The patient consents to medication trial.   -- Metabolic profile and EKG monitoring obtained while on an atypical antipsychotic (BMI: Lipid Panel: HbgA1c: QTc:) 457  -- Encouraged patient to participate in unit milieu and in scheduled group therapies   -- Short Term Goals: Ability to identify changes in lifestyle to reduce recurrence of condition will improve, Ability to verbalize feelings will improve, Ability to disclose and discuss suicidal ideas, Ability to demonstrate self-control will improve, Ability to identify and develop effective coping behaviors will improve, Ability to maintain clinical measurements within normal limits will improve, Compliance with prescribed medications will improve, and Ability to identify triggers associated with substance abuse/mental health issues will improve  -- Long Term Goals: Improvement in symptoms so as ready for discharge   3. Medical Issues Being Addressed:   Tobacco Use Disorder  -- Nicotine patch 21mg /24 hours ordered  -- Smoking cessation encouraged  4. Discharge Planning:   -- Social work and case management to assist with discharge planning and identification of hospital follow-up needs prior to discharge  -- Estimated LOS: 5-7 days  -- Discharge Concerns: Need to establish a safety plan; Medication compliance and effectiveness  -- Discharge Goals: Return home with outpatient referrals for mental health follow-up including medication management/psychotherapy    Total Time Spent in Direct Patient Care:  I personally spent 60 minutes on the unit in direct patient care. The direct patient care time included face-to-face time with the patient, reviewing the patient's chart, communicating with other professionals, and coordinating care. Greater  than 50% of this time was spent in counseling or coordinating care with the patient regarding goals of hospitalization, psycho-education, and discharge planning needs.   Janine Limbo, MD Psychiatrist     I certify that inpatient services furnished can reasonably be expected to  improve the patient's condition.   Christoper Allegra, MD 08/28/2021, 5:19 PM

## 2021-08-28 NOTE — Progress Notes (Signed)
Notified Lindon Romp NP of patient's reports of abdominal pain and anxiety. One time order given for 1mg  Ativan PO. Patient asked for Trazodone for sleep and informed by RN per NP due to EKG and nature of overdose he would be unable to receive medication. Patient became irritable and walked away from RN. Will continue to monitor and assess.

## 2021-08-28 NOTE — BH IP Treatment Plan (Signed)
Interdisciplinary Treatment and Diagnostic Plan Update  08/28/2021 Time of Session: 9:10am  Spencer Mendoza MRN: [id number]  Principal Diagnosis: <principal problem not specified>  Secondary Diagnoses: Active Problems:   MDD (major depressive disorder), recurrent severe, without psychosis ([place])   Current Medications:  Current Facility-Administered Medications  Medication Dose Route Frequency Provider Last Rate Last Admin   acetaminophen (TYLENOL) tablet 650 mg  650 mg Oral Q6H PRN Ethelene Hal, NP   650 mg at 08/28/21 0827   alum & mag hydroxide-simeth (MAALOX/MYLANTA) 200-200-20 MG/5ML suspension 30 mL  30 mL Oral Q4H PRN Ethelene Hal, NP       aspirin EC tablet 81 mg  81 mg Oral Daily Ethelene Hal, NP   81 mg at 08/28/21 3762   busPIRone (BUSPAR) tablet 7.5 mg  7.5 mg Oral Daily Ethelene Hal, NP   7.5 mg at 08/28/21 8315   chlordiazePOXIDE (LIBRIUM) capsule 25 mg  25 mg Oral Q6H PRN Lindell Spar I, NP       chlordiazePOXIDE (LIBRIUM) capsule 25 mg  25 mg Oral QID Lindell Spar I, NP   25 mg at 08/28/21 1102   Followed by   Derrill Memo ON 08/29/2021] chlordiazePOXIDE (LIBRIUM) capsule 25 mg  25 mg Oral TID Lindell Spar I, NP       Followed by   Derrill Memo ON 08/30/2021] chlordiazePOXIDE (LIBRIUM) capsule 25 mg  25 mg Oral BH-qamhs Nwoko, Agnes I, NP       Followed by   Derrill Memo ON 08/31/2021] chlordiazePOXIDE (LIBRIUM) capsule 25 mg  25 mg Oral Daily Nwoko, Agnes I, NP       famotidine (PEPCID) tablet 20 mg  20 mg Oral Daily Ethelene Hal, NP   20 mg at 08/28/21 0823   fluticasone furoate-vilanterol (BREO ELLIPTA) 200-25 MCG/INH 1 puff  1 puff Inhalation Daily Ethelene Hal, NP   1 puff at 17/61/60 7371   folic acid (FOLVITE) tablet 1 mg  1 mg Oral Daily Ethelene Hal, NP   1 mg at 08/28/21 0626   gabapentin (NEURONTIN) capsule 300 mg  300 mg Oral TID Lindell Spar I, NP   300 mg at 08/28/21 1053   hydrOXYzine (ATARAX/VISTARIL) tablet  25 mg  25 mg Oral Q6H PRN Lindell Spar I, NP       levETIRAcetam (KEPPRA) tablet 1,000 mg  1,000 mg Oral BID Ethelene Hal, NP   1,000 mg at 08/28/21 9485   loperamide (IMODIUM) capsule 2-4 mg  2-4 mg Oral PRN Lindell Spar I, NP       magnesium hydroxide (MILK OF MAGNESIA) suspension 30 mL  30 mL Oral Daily PRN Ethelene Hal, NP       mirtazapine (REMERON) tablet 7.5 mg  7.5 mg Oral QHS Briant Cedar, MD       multivitamin with minerals tablet 1 tablet  1 tablet Oral Daily Ethelene Hal, NP   1 tablet at 08/28/21 4627   nicotine (NICODERM CQ - dosed in mg/24 hours) patch 21 mg  21 mg Transdermal Daily Lavella Hammock, MD   21 mg at 08/28/21 0823   pantoprazole (PROTONIX) EC tablet 40 mg  40 mg Oral Daily Ethelene Hal, NP   40 mg at 08/28/21 0824   rosuvastatin (CRESTOR) tablet 10 mg  10 mg Oral Daily Ethelene Hal, NP   10 mg at 08/28/21 0823   sucralfate (CARAFATE) tablet 1 g  1 g Oral TID WC & HS  Ethelene Hal, NP   1 g at 08/28/21 1103   thiamine (B-1) injection 100 mg  100 mg Intramuscular Once Lindell Spar I, NP       Derrill Memo ON 08/29/2021] thiamine tablet 100 mg  100 mg Oral Daily Lindell Spar I, NP       traZODone (DESYREL) tablet 50 mg  50 mg Oral QHS PRN Ethelene Hal, NP       venlafaxine XR (EFFEXOR-XR) 24 hr capsule 37.5 mg  37.5 mg Oral Q breakfast Pashayan, Redgie Grayer, MD       PTA Medications: Medications Prior to Admission  Medication Sig Dispense Refill Last Dose   ALPRAZolam (XANAX) 0.25 MG tablet Take 0.25 mg by mouth 2 (two) times daily as needed for anxiety.   Past Month   albuterol (VENTOLIN HFA) 108 (90 Base) MCG/ACT inhaler INHALE 2 PUFFS BY MOUTH INTO THE LUNGS EVERY 4 HOURS AS NEEDED FOR SHORTNESS OF BREATH OR WHEEZING 8.5 g 3    aspirin EC 81 MG tablet Take 1 tablet (81 mg total) by mouth daily. Swallow whole. 30 tablet 11    budesonide-formoterol (SYMBICORT) 160-4.5 MCG/ACT inhaler Inhale 2 puffs into  the lungs 2 (two) times daily. 10.2 g 5    busPIRone (BUSPAR) 7.5 MG tablet TAKE 1 TABLET(7.5 MG) BY MOUTH DAILY 30 tablet 1    famotidine (PEPCID) 20 MG tablet TAKE 1 TABLET(20 MG) BY MOUTH AT BEDTIME 90 tablet 1    folic acid (FOLVITE) 1 MG tablet Take 1 tablet (1 mg total) by mouth daily. 30 tablet 3    levETIRAcetam (KEPPRA) 1000 MG tablet Take 1 tablet (1,000 mg total) by mouth 2 (two) times daily. 60 tablet 11    ondansetron (ZOFRAN ODT) 4 MG disintegrating tablet Dissolve 1 tablet every 4 hours as needed for nausea/vomit 14 tablet 0    pantoprazole (PROTONIX) 40 MG tablet TAKE 1 TABLET(40 MG) BY MOUTH DAILY 30 MINUTES BEFORE BREAKFAST 90 tablet 1    rosuvastatin (CRESTOR) 10 MG tablet Take 1 tablet (10 mg total) by mouth daily. 90 tablet 3    thiamine 100 MG tablet Take 1 tablet (100 mg total) by mouth daily. 30 tablet 3    traZODone (DESYREL) 100 MG tablet TAKE 2 TABLETS(200 MG) BY MOUTH AT BEDTIME 60 tablet 2     Patient Stressors: Financial difficulties   Health problems   Marital or family conflict   Medication change or noncompliance    Patient Strengths: Ability for insight  Communication skills  Supportive family/friends   Treatment Modalities: Medication Management, Group therapy, Case management,  1 to 1 session with clinician, Psychoeducation, Recreational therapy.   Physician Treatment Plan for Primary Diagnosis: <principal problem not specified> Long Term Goal(s):     Short Term Goals:    Medication Management: Evaluate patient's response, side effects, and tolerance of medication regimen.  Therapeutic Interventions: 1 to 1 sessions, Unit Group sessions and Medication administration.  Evaluation of Outcomes: Not Met  Physician Treatment Plan for Secondary Diagnosis: Active Problems:   MDD (major depressive disorder), recurrent severe, without psychosis ([place])  Long Term Goal(s):     Short Term Goals:       Medication Management: Evaluate patient's  response, side effects, and tolerance of medication regimen.  Therapeutic Interventions: 1 to 1 sessions, Unit Group sessions and Medication administration.  Evaluation of Outcomes: Not Met   RN Treatment Plan for Primary Diagnosis: <principal problem not specified> Long Term Goal(s): Knowledge of disease and therapeutic  regimen to maintain health will improve  Short Term Goals: Ability to remain free from injury will improve, Ability to participate in decision making will improve, Ability to verbalize feelings will improve, Ability to disclose and discuss suicidal ideas, and Ability to identify and develop effective coping behaviors will improve  Medication Management: RN will administer medications as ordered by provider, will assess and evaluate patient's response and provide education to patient for prescribed medication. RN will report any adverse and/or side effects to prescribing provider.  Therapeutic Interventions: 1 on 1 counseling sessions, Psychoeducation, Medication administration, Evaluate responses to treatment, Monitor vital signs and CBGs as ordered, Perform/monitor CIWA, COWS, AIMS and Fall Risk screenings as ordered, Perform wound care treatments as ordered.  Evaluation of Outcomes: Not Met   LCSW Treatment Plan for Primary Diagnosis: <principal problem not specified> Long Term Goal(s): Safe transition to appropriate next level of care at discharge, Engage patient in therapeutic group addressing interpersonal concerns.  Short Term Goals: Engage patient in aftercare planning with referrals and resources, Increase social support, Increase emotional regulation, Facilitate acceptance of mental health diagnosis and concerns, Identify triggers associated with mental health/substance abuse issues, and Increase skills for wellness and recovery  Therapeutic Interventions: Assess for all discharge needs, 1 to 1 time with Social worker, Explore available resources and support systems,  Assess for adequacy in community support network, Educate family and significant other(s) on suicide prevention, Complete Psychosocial Assessment, Interpersonal group therapy.  Evaluation of Outcomes: Not Met   Progress in Treatment: Attending groups: No. Participating in groups: No. Taking medication as prescribed: Yes. Toleration medication: Yes. Family/Significant other contact made: Yes, individual(s) contacted:  Girlfriend  Patient understands diagnosis: Yes. Discussing patient identified problems/goals with staff: Yes. Medical problems stabilized or resolved: Yes. Denies suicidal/homicidal ideation: Yes. Issues/concerns per patient self-inventory: No.   New problem(s) identified: No, Describe:  None  New Short Term/Long Term Goal(s): medication stabilization, elimination of SI thoughts, development of comprehensive mental wellness plan.   Patient Goals: "To go home"   Discharge Plan or Barriers: Patient recently admitted. CSW will continue to follow and assess for appropriate referrals and possible discharge planning.   Reason for Continuation of Hospitalization: Anxiety Depression Medical Issues Medication stabilization Suicidal ideation  Estimated Length of Stay: 3 to 5 days    Scribe for Treatment Team: Darleen Crocker, Latanya Presser 08/28/2021 2:27 PM

## 2021-08-28 NOTE — Group Note (Signed)
Recreation Therapy Group Note   Group Topic:Stress Management  Group Date: 08/28/2021 Start Time: 0930 End Time: 0950 Facilitators: Victorino Sparrow, LRT/CTRS Location: [address] Dayroom  Goal Area(s) Addresses:  Patient will actively participate in stress management techniques presented during session.  Patient will successfully identify benefit of practicing stress management post d/c.    Group Description:  LRT explained the stress management technique of meditation. LRT proceeded to play a meditation focused on self forgiveness.  Patient was asked to participate in the technique introduced during session.  Patients were given suggestions of ways to access scripts post d/c and encouraged to explore Youtube and other apps available on smartphones, tablets, and computers.   Affect/Mood: N/A   Participation Level: Did not attend    Clinical Observations/Individualized Feedback: Pt did not attend group.    Plan: Continue to engage patient in RT group sessions 2-3x/week.   Victorino Sparrow, LRT/CTRS 08/28/2021 12:26 PM

## 2021-08-28 NOTE — Progress Notes (Signed)
D:  Patient denied SI and HI, contracts for safety.  Denied A/V hallucinations.   A:  Medications administered per MD orders.  Emotional support and encouragement given patient. R:  Safety maintained with 15 minute checks.  Patient stated he does hear voices at times.  Later patient denied A/V hallucinations.

## 2021-08-28 NOTE — [facility] Group Notes (Signed)
Adult Psychoeducational Group Note  Date:  08/28/2021 Time:  11:23 AM  Group Topic/Focus:  Personal Choices and Values:   The focus of this group is to help patients assess and explore the importance of values in their lives, how their values affect their decisions, how they express their values and what opposes their expression.  Participation Level:  Active  Participation Quality:  Appropriate and Attentive  Affect:  Appropriate  Cognitive:  Alert and Appropriate  Insight: Appropriate and Good  Engagement in Group:  Engaged  Modes of Intervention:  Discussion  Loney Loh 08/28/2021, 11:23 AM
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
## 2021-08-28 NOTE — BHH Group Notes (Signed)
Patient did not attend the Psycho-Ed group. 

## 2021-08-29 DIAGNOSIS — F17200 Nicotine dependence, unspecified, uncomplicated: Secondary | ICD-10-CM

## 2021-08-29 DIAGNOSIS — J449 Chronic obstructive pulmonary disease, unspecified: Secondary | ICD-10-CM

## 2021-08-29 DIAGNOSIS — G40909 Epilepsy, unspecified, not intractable, without status epilepticus: Secondary | ICD-10-CM

## 2021-08-29 DIAGNOSIS — F10130 Alcohol abuse with withdrawal, uncomplicated: Secondary | ICD-10-CM

## 2021-08-29 MED ORDER — VENLAFAXINE HCL ER 75 MG PO CP24
75.0000 mg | ORAL_CAPSULE | Freq: Every day | ORAL | Status: DC
  Administered 2021-08-30 – 2021-09-03 (×5): 75 mg via ORAL
  Filled 2021-08-29 (×7): qty 1

## 2021-08-29 MED ORDER — IBUPROFEN 600 MG PO TABS
600.0000 mg | ORAL_TABLET | Freq: Three times a day (TID) | ORAL | Status: DC | PRN
  Administered 2021-08-30 – 2021-09-02 (×3): 600 mg via ORAL
  Filled 2021-08-29 (×4): qty 1

## 2021-08-29 MED ORDER — MIRTAZAPINE 15 MG PO TABS
15.0000 mg | ORAL_TABLET | Freq: Every day | ORAL | Status: DC
  Administered 2021-08-29 – 2021-09-02 (×5): 15 mg via ORAL
  Filled 2021-08-29 (×7): qty 1

## 2021-08-29 MED ORDER — BUSPIRONE HCL 7.5 MG PO TABS
7.5000 mg | ORAL_TABLET | Freq: Two times a day (BID) | ORAL | Status: DC
Start: 2021-08-29 — End: 2021-08-31
  Administered 2021-08-29 – 2021-08-31 (×4): 7.5 mg via ORAL
  Filled 2021-08-29 (×6): qty 1

## 2021-08-29 NOTE — Progress Notes (Signed)
Pt did not attend group after verbal prompt. 

## 2021-08-29 NOTE — Progress Notes (Addendum)
[facility] MD Progress Note  08/29/2021 12:53 PM Spencer Mendoza  MRN:  [id number] Subjective:   Spencer Mendoza is a 60 yr old male with a PPHx significant for MDD and EtOH use disorder, he was admitted under IVC for a suicide attempt via overdose on Trazodone.   Psychiatric team made the following recommendations yesterday: -Start Effexor XR 37.5 mg daily -Start Remeron 7.5 mg QHS -Continue Buspar 7.5 mg daily   Case was discussed in the multidisciplinary team. MAR was reviewed and patient was compliant with medications.    On interview today patient reports that he slept ok last night.  He reports that his appetite is improving.  He reports no SI, HI, or AVH.  He states that he does not want to hurt himself and that he has so many things to love for.  He reports that he has not been able to attend any groups.  He reports he just gets too anxious and there is too many people in the eye.  Discussed with him that this was okay and that he needed to take his time and work up to attending groups.  Informed him that when he does go to group he has a few options first that he can leave at any time if his anxiety starts become overwhelming and to that if at first he is too anxious to speak that is fine he can just sit in the group and listen.  Reinforced that he needs to be patient with himself and give himself time to improve.  Discussed that we would increase his Remeron tonight and Effexor tomorrow.  He was agreeable to this.  He reports his fiance is a big support for him and that he is talk with her on the phone a few times since has been admitted.  He did ask if he could be discharged.  Discussed with him that while we are making medication changes he would need to stay on the inpatient unit to be safely monitored.  He was understanding of this and stated that he was willing to accept any medication changes that were needed.  He reports that he does have a headache but it is not yet to the level  of a migraine.  He said that he had not taken anything for it and reminded him that as needed Tylenol was available and encouraged him to take 1.  He reports no other concerns at present.   Principal Problem: <principal problem not specified> Diagnosis: Active Problems:   MDD (major depressive disorder), recurrent severe, without psychosis ([place])  Total Time spent with patient: 30 minutes  Past Psychiatric History: MDD and EtOH use disorder   Past Medical History:  Past Medical History:  Diagnosis Date   Alcohol withdrawal ([place]) 07/29/2013   Allergy    Anxiety    Arthritis    Benzodiazepine abuse ([place]) 08/30/2019   Bipolar disorder ([place])    CAD (coronary artery disease)    Chronic insomnia 06/06/2015   Chronic migraine without aura, with intractable migraine, so stated, with status migrainosus 08/25/2019   Claustrophobia    Cocaine abuse ([place]) 05/22/2015   COPD (chronic obstructive pulmonary disease) ([place])    Delirium tremens ([place]) 07/29/2013   Depression    Epilepsy ([place])    last seizure week of 06-25-2020- sev. grand mal seizures per pt    Hemiplegic migraine with status migrainosus 06/06/2015   Hypertension    Migraine    Opioid use disorder 08/30/2019   PONV (postoperative  nausea and vomiting)    Stroke [facility])    patient denies- states had a hemiplegic migraine in ~2011   Substance abuse ([place])     Past Surgical History:  Procedure Laterality Date   ANKLE SURGERY     in high school   APPENDECTOMY     COLONOSCOPY     KNEE ARTHROSCOPY Right    NOSE SURGERY     Family History:  Family History  Problem Relation Age of Onset   Heart disease Mother    Stroke Mother    Epilepsy Mother    Heart failure Mother    Cancer Father    Heart disease Father    Hypertension Father    Melanoma Father    Migraines Father    Colon cancer Sister 41   Colon cancer Cousin    Cancer Cousin    Colon polyps Neg Hx    Esophageal cancer Neg Hx    Rectal cancer Neg Hx    Stomach  cancer Neg Hx    Family Psychiatric  History: None Social History:  Social History   Substance and Sexual Activity  Alcohol Use Not Currently   Alcohol/week: 6.0 standard drinks   Types: 6 Cans of beer per week   Comment: none since Sept 2020     Social History   Substance and Sexual Activity  Drug Use Not Currently   Types: Marijuana   Comment: pt denies    Social History   Socioeconomic History   Marital status: Legally Separated    Spouse name: Not on file   Number of children: 0   Years of education: GED   Highest education level: Not on file  Occupational History   Occupation: disabled  Tobacco Use   Smoking status: Every Day    Years: 15.00    Types: Cigarettes   Smokeless tobacco: Never   Tobacco comments:    Reports 2 cigarettes daily  Vaping Use   Vaping Use: Never used  Substance and Sexual Activity   Alcohol use: Not Currently    Alcohol/week: 6.0 standard drinks    Types: 6 Cans of beer per week    Comment: none since Sept 2020   Drug use: Not Currently    Types: Marijuana    Comment: pt denies   Sexual activity: Yes    Birth control/protection: Condom  Other Topics Concern   Not on file  Social History Narrative   Pt lives with sister.   Patient drinks 2 cups of caffeine daily.   Patient is right handed.   Social Determinants of Health   Financial Resource Strain: Not on file  Food Insecurity: Not on file  Transportation Needs: Not on file  Physical Activity: Not on file  Stress: Not on file  Social Connections: Not on file   Additional Social History:                         Sleep: Good  Appetite:  Good  Current Medications: Current Facility-Administered Medications  Medication Dose Route Frequency Provider Last Rate Last Admin   acetaminophen (TYLENOL) tablet 650 mg  650 mg Oral Q6H PRN Ethelene Hal, NP   650 mg at 08/28/21 0827   alum & mag hydroxide-simeth (MAALOX/MYLANTA) 200-200-20 MG/5ML suspension 30 mL   30 mL Oral Q4H PRN Ethelene Hal, NP       aspirin EC tablet 81 mg  81 mg Oral Daily Ethelene Hal, NP  81 mg at 08/29/21 0810   busPIRone (BUSPAR) tablet 7.5 mg  7.5 mg Oral BID Briant Cedar, MD       famotidine (PEPCID) tablet 20 mg  20 mg Oral Daily Ethelene Hal, NP   20 mg at 08/29/21 0809   fluticasone furoate-vilanterol (BREO ELLIPTA) 200-25 MCG/INH 1 puff  1 puff Inhalation Daily Ethelene Hal, NP   1 puff at 62/83/15 1761   folic acid (FOLVITE) tablet 1 mg  1 mg Oral Daily Ethelene Hal, NP   1 mg at 08/29/21 0810   hydrOXYzine (ATARAX/VISTARIL) tablet 25 mg  25 mg Oral Q6H PRN Lindell Spar I, NP       ibuprofen (ADVIL) tablet 600 mg  600 mg Oral TID PRN Janine Limbo, MD       levETIRAcetam (KEPPRA) tablet 1,000 mg  1,000 mg Oral BID Ethelene Hal, NP   1,000 mg at 08/29/21 6073   loperamide (IMODIUM) capsule 2-4 mg  2-4 mg Oral PRN Lindell Spar I, NP       LORazepam (ATIVAN) tablet 0.5 mg  0.5 mg Oral Daily PRN Briant Cedar, MD       magnesium hydroxide (MILK OF MAGNESIA) suspension 30 mL  30 mL Oral Daily PRN Ethelene Hal, NP       mirtazapine (REMERON) tablet 15 mg  15 mg Oral QHS Briant Cedar, MD       multivitamin with minerals tablet 1 tablet  1 tablet Oral Daily Ethelene Hal, NP   1 tablet at 08/29/21 0810   nicotine (NICODERM CQ - dosed in mg/24 hours) patch 21 mg  21 mg Transdermal Daily Lavella Hammock, MD   21 mg at 08/29/21 0809   pantoprazole (PROTONIX) EC tablet 40 mg  40 mg Oral Daily Ethelene Hal, NP   40 mg at 08/29/21 0810   rosuvastatin (CRESTOR) tablet 10 mg  10 mg Oral Daily Ethelene Hal, NP   10 mg at 08/29/21 0809   sucralfate (CARAFATE) tablet 1 g  1 g Oral TID WC & HS Ethelene Hal, NP   1 g at 08/29/21 0810   thiamine (B-1) injection 100 mg  100 mg Intramuscular Once Lindell Spar I, NP       thiamine tablet 100 mg  100 mg Oral Daily  Lindell Spar I, NP   100 mg at 08/29/21 0809   traZODone (DESYREL) tablet 50 mg  50 mg Oral QHS PRN Ethelene Hal, NP       Derrill Memo ON 08/30/2021] venlafaxine XR (EFFEXOR-XR) 24 hr capsule 75 mg  75 mg Oral Q breakfast Zyrus Hetland, Redgie Grayer, MD        Lab Results:  Results for orders placed or performed during the hospital encounter of 08/27/21 (from the past 48 hour(s))  Lipid panel     Status: None   Collection Time: 08/28/21  6:19 PM  Result Value Ref Range   Cholesterol 142 0 - 200 mg/dL   Triglycerides 91 <150 mg/dL   HDL 66 >40 mg/dL   Total CHOL/HDL Ratio 2.2 RATIO   VLDL 18 0 - 40 mg/dL   LDL Cholesterol 58 0 - 99 mg/dL    Comment:        Total Cholesterol/HDL:CHD Risk Coronary Heart Disease Risk Table                     Men   Women  1/2 Average Risk  3.4   3.3  Average Risk       5.0   4.4  2 X Average Risk   9.6   7.1  3 X Average Risk  23.4   11.0        Use the calculated Patient Ratio above and the CHD Risk Table to determine the patient's CHD Risk.        ATP III CLASSIFICATION (LDL):  <100     mg/dL   Optimal  100-129  mg/dL   Near or Above                    Optimal  130-159  mg/dL   Borderline  160-189  mg/dL   High  >190     mg/dL   Very High Performed at [facility] [address] [place], [place] [address]   TSH     Status: None   Collection Time: 08/28/21  6:19 PM  Result Value Ref Range   TSH 1.898 0.350 - 4.500 uIU/mL    Comment: Performed by a 3rd Generation assay with a functional sensitivity of <=0.01 uIU/mL. Performed at [facility], [place] [address], [place], [place] [address]     Blood Alcohol level:  Lab Results  Component Value Date   [facility] <10 08/26/2021   ETH <10 77/41/2878    Metabolic Disorder Labs: Lab Results  Component Value Date   HGBA1C 4.9 02/01/2018   MPG 93.93 02/01/2018   MPG 97 12/05/2013   Lab Results  Component Value Date   PROLACTIN 15.2 02/01/2018   Lab  Results  Component Value Date   CHOL 142 08/28/2021   TRIG 91 08/28/2021   HDL 66 08/28/2021   CHOLHDL 2.2 08/28/2021   VLDL 18 08/28/2021   LDLCALC 58 08/28/2021   LDLCALC 50 07/13/2020    Physical Findings: CIWA:  CIWA-Ar Total: 6   Musculoskeletal: Strength & Muscle Tone: within normal limits Gait & Station: normal Patient leans: N/A  Psychiatric Specialty Exam:  Presentation  General Appearance: Appropriate for Environment; Disheveled  Eye Contact:Fair  Speech:Clear and Coherent; Slow  Speech Volume:Decreased  Handedness:Right   Mood and Affect  Mood:Anxious; Depressed  Affect:Congruent; Depressed; Tearful   Thought Process  Thought Processes:Coherent  Descriptions of Associations:Intact  Orientation:Full (Time, Place and Person)  Thought Content:Logical  History of Schizophrenia/Schizoaffective disorder:No  Duration of Psychotic Symptoms:No data recorded Hallucinations:Hallucinations: None  Ideas of Reference:None  Suicidal Thoughts:Suicidal Thoughts: No SI Active Intent and/or Plan: Without Intent; Without Plan  Homicidal Thoughts:Homicidal Thoughts: No   Sensorium  Memory:Immediate Good; Recent Good  Judgment:Poor  Insight:Poor   Executive Functions  Concentration:Fair  Attention Span:Fair  [place]  Language:Good   Psychomotor Activity  Psychomotor Activity:Psychomotor Activity: Normal   Assets  Assets:Communication Skills; Desire for Improvement; Resilience; Social Support; Other (comment) (his dog)   Sleep  Sleep:Sleep: Good Number of Hours of Sleep: 6    Physical Exam: Physical Exam Vitals and nursing note reviewed.  Constitutional:      General: He is not in acute distress.    Appearance: Normal appearance. He is normal weight. He is not ill-appearing or toxic-appearing.  HENT:     Head: Normocephalic and atraumatic.  Pulmonary:     Effort: Pulmonary effort is normal.   Musculoskeletal:        General: Normal range of motion.  Neurological:     General: No focal deficit present.     Mental  Status: He is alert.   Review of Systems  Respiratory:  Negative for cough and shortness of breath.   Cardiovascular:  Negative for chest pain.  Gastrointestinal:  Negative for abdominal pain, constipation, diarrhea, nausea and vomiting.  Neurological:  Positive for headaches. Negative for weakness.  Psychiatric/Behavioral:  Negative for depression, hallucinations and suicidal ideas. The patient is nervous/anxious.   Blood pressure 133/80, pulse 60, temperature 97.6 F (36.4 C), resp. rate 18, height 6' (1.829 m), weight 68.9 kg, SpO2 98 %. Body mass index is 20.61 kg/m.   Treatment Plan Summary: Daily contact with patient to assess and evaluate symptoms and progress in treatment  Spencer Mendoza is a 60 yr old male with a PPHx significant for MDD and EtOH use disorder, he was admitted under IVC for a suicide attempt via overdose on Trazodone.   He continues to be very tearful and apologetic over his suicide attempt.  While he still wants to be discharged he is understanding of the fact that while we are making medication changes for his safety he needs to remain in the hospital.  Will increase his Remeron tonight and increase his Effexor tomorrow morning.  We will continue to monitor.   MDD, Recurrent, Severe w/out psychosis  GAD  PTSD: -Increase Effexor XR from 37.5 mg to 75 mg tomorrow -Increase Remeron from 7.5 mg to 15 mg QHS -Change frequency of Buspar 7.5 mg to BID     Epilepsy: -Continue Keppra 1000 mg BID     Withdrawal: -Continue CIWA -Continue Imodium 2-4 mg  -Continue Zofran-ODT 4 mg q6 PRN -Continue Thiamine 100 mg daily -Continue Folic Acid 1 mg daily -Continue Multivitamin daily     COPD: -Continue Breo Ellipta 1 puff daily     Nicotine Dependence: -Continue Nicotine Patch 21 mg daily     -Continue Aspirin 81 mg  daily -Continue Pepcid 20 mg daily -Continue Rosuvastatin 10 mg daily -Continue Carafate 1 g TID with meals -Start Advil 600 mg TID PRN -Continue PRN's: Tylenol, Maalox, Atarax, Milk of Magnesia, Trazodone   Briant Cedar, MD 08/29/2021, 12:53 PM

## 2021-08-29 NOTE — BHH Group Notes (Signed)
[facility] Group Notes:  (Nursing/MHT/Case Management/Adjunct)  Date:  08/29/2021  Time:  8:41 PM  Type of Therapy:   wrap up   Participation Level:  Active  Participation Quality:  Appropriate  Affect:  Appropriate  Cognitive:  Appropriate  Insight:  Good and Improving  Engagement in Group:  Developing/Improving  Modes of Intervention:  Discussion  Summary of Progress/Problems: PT states that he is working on being a better person and being the best that he can be. PT participated well in group.  Maxine Glenn 08/29/2021, 8:41 PM

## 2021-08-29 NOTE — Progress Notes (Signed)
   08/29/21 1100  Psych Admission Type (Psych Patients Only)  Admission Status Involuntary  Psychosocial Assessment  Patient Complaints Anxiety  Eye Contact Fair  Facial Expression Anxious;Pained  Affect Anxious;Depressed  Speech Logical/coherent  Interaction Assertive  Motor Activity Slow;Unsteady;Fidgety  Appearance/Hygiene In scrubs;Disheveled  Behavior Characteristics Anxious  Mood Anxious;Depressed  Thought Pension scheme manager thinking  Content Blaming others;Blaming self  Delusions None reported or observed  Perception WDL  Hallucination None reported or observed  Judgment Poor  Confusion None  Danger to Self  Current suicidal ideation? Denies  Self-Injurious Behavior No self-injurious ideation or behavior indicators observed or expressed   Danger to Others  Danger to Others None reported or observed  Dar Note: Patient presents with irritable affect and mood.  Refused PRN medication for headache when offered. Instead was asking for Xanax and Tylenol PM.  MD made aware of request.  Offered Ativan, Motrin and Tylenol but refused the second time.  Safety checks maintained.  Patient is withdrawn and isolates to his room.

## 2021-08-29 NOTE — BHH Suicide Risk Assessment (Signed)
[place] INPATIENT:  Family/Significant Other Suicide Prevention Education  Suicide Prevention Education:  Education Completed; [name] [phone] (Girlfriend) has been identified by the patient as the family member/significant other with whom the patient will be residing, and identified as the person(s) who will aid the patient in the event of a mental health crisis (suicidal ideations/suicide attempt).  With written consent from the patient, the family member/significant other has been provided the following suicide prevention education, prior to the and/or following the discharge of the patient.  The suicide prevention education provided includes the following: Suicide risk factors Suicide prevention and interventions National Suicide Hotline telephone number [facility] assessment telephone number [facility] [place] and/or Residential Mobile Crisis Unit telephone number  Request made of family/significant other to: Remove weapons (e.g., guns, rifles, knives), all items previously/currently identified as safety concern.   Remove drugs/medications (over-the-counter, prescriptions, illicit drugs), all items previously/currently identified as a safety concern.  The family member/significant other verbalizes understanding of the suicide prevention education information provided.  The family member/significant other agrees to remove the items of safety concern listed above.  CSW spoke with [name] who states that her boyfriend has been depressed due to his medical issues and she states that he has been trying to work on things but that it has been difficult for him.  She states that there is no substance use except some occasional Marijuana use.  [name] states that there are no firearms in the home and that she feels safe with him coming back to the home.  CSW completed SPE with [name].   Spencer Mendoza 08/29/2021, 1:04 PM

## 2021-08-29 NOTE — Progress Notes (Signed)
   08/29/21 2104  Psych Admission Type (Psych Patients Only)  Admission Status Involuntary  Psychosocial Assessment  Patient Complaints Anxiety  Eye Contact Fair  Facial Expression Sullen;Pained  Affect Sullen;Sad  Speech Logical/coherent  Interaction Assertive  Motor Activity Slow;Unsteady  Appearance/Hygiene In scrubs;Disheveled  Behavior Characteristics Cooperative;Appropriate to situation  Mood Anxious;Depressed  Thought Process  Coherency Concrete thinking  Content Blaming others;Blaming self  Delusions None reported or observed  Perception WDL  Hallucination None reported or observed  Judgment Poor  Confusion None  Danger to Self  Current suicidal ideation? Denies  Self-Injurious Behavior No self-injurious ideation or behavior indicators observed or expressed   Danger to Others  Danger to Others None reported or observed

## 2021-08-29 NOTE — Progress Notes (Signed)
   08/29/21 0000  Psych Admission Type (Psych Patients Only)  Admission Status Involuntary  Psychosocial Assessment  Patient Complaints Anxiety;Depression  Eye Contact Fair  Facial Expression Anxious;Pained  Affect Anxious;Depressed  Speech Logical/coherent  Interaction Assertive  Motor Activity Slow;Unsteady;Fidgety  Appearance/Hygiene In scrubs;Disheveled  Behavior Characteristics Anxious  Mood Depressed;Anxious  Thought Process  Coherency Concrete thinking  Content Blaming others;Blaming self  Delusions None reported or observed  Perception WDL  Hallucination None reported or observed  Judgment Poor  Confusion None  Danger to Self  Current suicidal ideation? Denies  Self-Injurious Behavior No self-injurious ideation or behavior indicators observed or expressed   Danger to Others  Danger to Others None reported or observed  Pt compliant with treatment, scheduled medications administered per Provider order. Support and encouragement provided. Routine safety checks conducted every 15 minutes. Patient notified to inform staff with problems or concerns. No adverse drug reactions noted. Patient contracts for safety at this time. Will continue to monitor.

## 2021-08-30 MED ORDER — LISINOPRIL 5 MG PO TABS
5.0000 mg | ORAL_TABLET | Freq: Every day | ORAL | Status: DC
  Administered 2021-08-30 – 2021-09-02 (×4): 5 mg via ORAL
  Filled 2021-08-30 (×6): qty 1

## 2021-08-30 MED ORDER — ALBUTEROL SULFATE HFA 108 (90 BASE) MCG/ACT IN AERS
1.0000 | INHALATION_SPRAY | Freq: Four times a day (QID) | RESPIRATORY_TRACT | Status: DC | PRN
  Administered 2021-08-30 – 2021-09-03 (×4): 2 via RESPIRATORY_TRACT
  Filled 2021-08-30: qty 6.7

## 2021-08-30 NOTE — Progress Notes (Addendum)
Spencer Mendoza  08/30/2021 2:18 PM Spencer Mendoza  MRN:  [id number] Subjective:   Spencer Mendoza is a 60 yr old male with a PPHx significant for MDD and EtOH use disorder, he was admitted under IVC for a suicide attempt via overdose on Trazodone.   Psychiatric team made the following recommendations yesterday: -Increase Effexor XR from 37.5 mg to 75 mg tomorrow -Increase Remeron from 7.5 mg to 15 mg QHS -Change frequency of Buspar 7.5 mg to BID   Case was discussed in the multidisciplinary team. MAR was reviewed and patient was compliant with medications.    On interview today patient reports that he has an improved mood.  He reports that his sleep is good.  He reports that his appetite is good.  He reports no SI, HI, or AVH.   He reports that he is feeling better today.  He reports that he was able to wake up without feeling depressed.  He reports when he woke up this morning he actually had energy and wanted to get on the day.  He reports that he feels likely significant weight has been lifted off of him because he states that he is told us things that he has never told anyone in his entire life.  He reports that even his girlfriend has not been told about his fears and anxieties.  He does report that his girlfriend is breaking up with him over his suicide attempt.  However, he states that while he had been supportive of her struggles with mental health she had never been supportive of his and so he feels like it is for the best as he needs someone who can be supportive of him as he has of them.  He did report that he wants therapy provided by a service that can do telephone visits.  He reports that his inability to go in front of crowds or enclosed spaces would mean he would not get benefit from in person therapy at this time.  Discussed that I would pass this along with social work and that he should discuss this with them when they begin planning his discharge.  He reports that he  had a little bit of sweating on his arms but it was minor.  He reports no other concerns at this time.  Principal Problem: MDD (major depressive disorder), recurrent severe, without psychosis ([place]) Diagnosis: Principal Problem:   MDD (major depressive disorder), recurrent severe, without psychosis ([place])  Total Time spent with patient: 30 minutes  Past Psychiatric History: MDD and EtOH use disorder   Past Medical History:  Past Medical History:  Diagnosis Date   Alcohol withdrawal ([place]) 07/29/2013   Allergy    Anxiety    Arthritis    Benzodiazepine abuse ([place]) 08/30/2019   Bipolar disorder ([place])    CAD (coronary artery disease)    Chronic insomnia 06/06/2015   Chronic migraine without aura, with intractable migraine, so stated, with status migrainosus 08/25/2019   Claustrophobia    Cocaine abuse ([place]) 05/22/2015   COPD (chronic obstructive pulmonary disease) ([place])    Delirium tremens ([place]) 07/29/2013   Depression    Epilepsy ([place])    last seizure week of 06-25-2020- sev. grand mal seizures per pt    Hemiplegic migraine with status migrainosus 06/06/2015   Hypertension    Migraine    Opioid use disorder 08/30/2019   PONV (postoperative nausea and vomiting)    Stroke [facility])    patient denies- states had a hemiplegic  migraine in ~2011   Substance abuse (HCC)     Past Surgical History:  Procedure Laterality Date   ANKLE SURGERY     in high school   APPENDECTOMY     COLONOSCOPY     KNEE ARTHROSCOPY Right    NOSE SURGERY     Family History:  Family History  Problem Relation Age of Onset   Heart disease Mother    Stroke Mother    Epilepsy Mother    Heart failure Mother    Cancer Father    Heart disease Father    Hypertension Father    Melanoma Father    Migraines Father    Colon cancer Sister 28   Colon cancer Cousin    Cancer Cousin    Colon polyps Neg Hx    Esophageal cancer Neg Hx    Rectal cancer Neg Hx    Stomach cancer Neg Hx    Family Psychiatric   History: None Social History:  Social History   Substance and Sexual Activity  Alcohol Use Not Currently   Alcohol/week: 6.0 standard drinks   Types: 6 Cans of beer per week   Comment: none since Sept 2020     Social History   Substance and Sexual Activity  Drug Use Not Currently   Types: Marijuana   Comment: pt denies    Social History   Socioeconomic History   Marital status: Legally Separated    Spouse name: Not on file   Number of children: 0   Years of education: GED   Highest education level: Not on file  Occupational History   Occupation: disabled  Tobacco Use   Smoking status: Every Day    Years: 15.00    Types: Cigarettes   Smokeless tobacco: Never   Tobacco comments:    Reports 2 cigarettes daily  Vaping Use   Vaping Use: Never used  Substance and Sexual Activity   Alcohol use: Not Currently    Alcohol/week: 6.0 standard drinks    Types: 6 Cans of beer per week    Comment: none since Sept 2020   Drug use: Not Currently    Types: Marijuana    Comment: pt denies   Sexual activity: Yes    Birth control/protection: Condom  Other Topics Concern   Not on file  Social History Narrative   Pt lives with sister.   Patient drinks 2 cups of caffeine daily.   Patient is right handed.   Social Determinants of Health   Financial Resource Strain: Not on file  Food Insecurity: Not on file  Transportation Needs: Not on file  Physical Activity: Not on file  Stress: Not on file  Social Connections: Not on file   Additional Social History:                         Sleep: Good  Appetite:  Good  Current Medications: Current Facility-Administered Medications  Medication Dose Route Frequency Provider Last Rate Last Admin   acetaminophen (TYLENOL) tablet 650 mg  650 mg Oral Q6H PRN Ethelene Hal, NP   650 mg at 08/28/21 0827   albuterol (VENTOLIN HFA) 108 (90 Base) MCG/ACT inhaler 1-2 puff  1-2 puff Inhalation Q6H PRN Briant Cedar, MD        alum & mag hydroxide-simeth (MAALOX/MYLANTA) 200-200-20 MG/5ML suspension 30 mL  30 mL Oral Q4H PRN Ethelene Hal, NP       aspirin EC tablet 81 mg  81 mg Oral Daily Ethelene Hal, NP   81 mg at 08/30/21 7681   busPIRone (BUSPAR) tablet 7.5 mg  7.5 mg Oral BID Briant Cedar, MD   7.5 mg at 08/30/21 0816   famotidine (PEPCID) tablet 20 mg  20 mg Oral Daily Ethelene Hal, NP   20 mg at 08/30/21 0816   fluticasone furoate-vilanterol (BREO ELLIPTA) 200-25 MCG/INH 1 puff  1 puff Inhalation Daily Ethelene Hal, NP   1 puff at 15/72/62 0355   folic acid (FOLVITE) tablet 1 mg  1 mg Oral Daily Ethelene Hal, NP   1 mg at 08/30/21 9741   hydrOXYzine (ATARAX/VISTARIL) tablet 25 mg  25 mg Oral Q6H PRN Lindell Spar I, NP       ibuprofen (ADVIL) tablet 600 mg  600 mg Oral TID PRN Janine Limbo, MD       levETIRAcetam (KEPPRA) tablet 1,000 mg  1,000 mg Oral BID Ethelene Hal, NP   1,000 mg at 08/30/21 6384   loperamide (IMODIUM) capsule 2-4 mg  2-4 mg Oral PRN Lindell Spar I, NP       LORazepam (ATIVAN) tablet 0.5 mg  0.5 mg Oral Daily PRN Briant Cedar, MD   0.5 mg at 08/29/21 1815   magnesium hydroxide (MILK OF MAGNESIA) suspension 30 mL  30 mL Oral Daily PRN Ethelene Hal, NP       mirtazapine (REMERON) tablet 15 mg  15 mg Oral QHS Briant Cedar, MD   15 mg at 08/29/21 2104   multivitamin with minerals tablet 1 tablet  1 tablet Oral Daily Ethelene Hal, NP   1 tablet at 08/30/21 0816   nicotine (NICODERM CQ - dosed in mg/24 hours) patch 21 mg  21 mg Transdermal Daily Lavella Hammock, MD   21 mg at 08/30/21 0815   pantoprazole (PROTONIX) EC tablet 40 mg  40 mg Oral Daily Ethelene Hal, NP   40 mg at 08/30/21 0817   rosuvastatin (CRESTOR) tablet 10 mg  10 mg Oral Daily Ethelene Hal, NP   10 mg at 08/30/21 0818   sucralfate (CARAFATE) tablet 1 g  1 g Oral TID WC & HS Ethelene Hal, NP   1 g  at 08/30/21 1258   thiamine (B-1) injection 100 mg  100 mg Intramuscular Once Lindell Spar I, NP       thiamine tablet 100 mg  100 mg Oral Daily Lindell Spar I, NP   100 mg at 08/30/21 0817   traZODone (DESYREL) tablet 50 mg  50 mg Oral QHS PRN Ethelene Hal, NP       venlafaxine XR (EFFEXOR-XR) 24 hr capsule 75 mg  75 mg Oral Q breakfast Briant Cedar, MD   75 mg at 08/30/21 5364    Lab Results:  Results for orders placed or performed during the hospital encounter of 08/27/21 (from the past 48 hour(s))  Lipid panel     Status: None   Collection Time: 08/28/21  6:19 PM  Result Value Ref Range   Cholesterol 142 0 - 200 mg/dL   Triglycerides 91 <150 mg/dL   HDL 66 >40 mg/dL   Total CHOL/HDL Ratio 2.2 RATIO   VLDL 18 0 - 40 mg/dL   LDL Cholesterol 58 0 - 99 mg/dL    Comment:        Total Cholesterol/HDL:CHD Risk Coronary Heart Disease Risk Table  Men   Women  1/2 Average Risk   3.4   3.3  Average Risk       5.0   4.4  2 X Average Risk   9.6   7.1  3 X Average Risk  23.4   11.0        Use the calculated Patient Ratio above and the CHD Risk Table to determine the patient's CHD Risk.        ATP III CLASSIFICATION (LDL):  <100     mg/dL   Optimal  100-129  mg/dL   Near or Above                    Optimal  130-159  mg/dL   Borderline  160-189  mg/dL   High  >190     mg/dL   Very High Performed at [facility] [address] [place], [place] [address]   TSH     Status: None   Collection Time: 08/28/21  6:19 PM  Result Value Ref Range   TSH 1.898 0.350 - 4.500 uIU/mL    Comment: Performed by a 3rd Generation assay with a functional sensitivity of <=0.01 uIU/mL. Performed at [facility], [place] [address] [place], [place] [address]     Blood Alcohol level:  Lab Results  Component Value Date   [facility] <10 08/26/2021   ETH <10 92/09/9416    Metabolic Disorder Labs: Lab Results  Component Value Date    HGBA1C 4.9 02/01/2018   MPG 93.93 02/01/2018   MPG 97 12/05/2013   Lab Results  Component Value Date   PROLACTIN 15.2 02/01/2018   Lab Results  Component Value Date   CHOL 142 08/28/2021   TRIG 91 08/28/2021   HDL 66 08/28/2021   CHOLHDL 2.2 08/28/2021   VLDL 18 08/28/2021   LDLCALC 58 08/28/2021   LDLCALC 50 07/13/2020    Physical Findings: CIWA:  CIWA-Ar Total: 3   Musculoskeletal: Strength & Muscle Tone: within normal limits Gait & Station: normal Patient leans: N/A  Psychiatric Specialty Exam:  Presentation  General Appearance: Appropriate for Environment; Casual  Eye Contact:Fair  Speech:Clear and Coherent; Slow  Speech Volume:Decreased  Handedness:Right   Mood and Affect  Mood:Anxious; Dysphoric (improved)  Affect:Congruent   Thought Process  Thought Processes:Coherent  Descriptions of Associations:Intact  Orientation:Full (Time, Place and Person)  Thought Content:Logical; WDL  History of Schizophrenia/Schizoaffective disorder:No  Duration of Psychotic Symptoms:No data recorded Hallucinations:Hallucinations: None  Ideas of Reference:None  Suicidal Thoughts:Suicidal Thoughts: No  Homicidal Thoughts:Homicidal Thoughts: No   Sensorium  Memory:Immediate Good; Recent Good  Judgment:Poor (improving)  Insight:Poor (improving)   Executive Functions  Concentration:Good  Attention Span:Good  [place] of Knowledge:Good  Language:Good   Psychomotor Activity  Psychomotor Activity:Psychomotor Activity: Normal   Assets  Assets:Communication Skills; Desire for Improvement; Resilience; Other (comment) (his dog)   Sleep  Sleep:Sleep: Good Number of Hours of Sleep: 6.5    Physical Exam: Physical Exam Vitals and nursing Mendoza reviewed.  Constitutional:      General: He is not in acute distress.    Appearance: Normal appearance. He is normal weight. He is not ill-appearing or toxic-appearing.  HENT:     Head:  Normocephalic and atraumatic.  Pulmonary:     Effort: Pulmonary effort is normal.  Musculoskeletal:        General: Normal range of motion.  Neurological:     General: No focal deficit present.  Mental Status: He is alert.   Review of Systems  Respiratory:  Negative for cough and shortness of breath.   Cardiovascular:  Negative for chest pain.  Gastrointestinal:  Negative for abdominal pain, constipation, diarrhea, nausea and vomiting.  Neurological:  Negative for weakness and headaches.  Psychiatric/Behavioral:  Positive for depression (improving). Negative for hallucinations and suicidal ideas. The patient is nervous/anxious (improving).   Blood pressure (!) 149/104, pulse 86, temperature 97.6 F (36.4 C), resp. rate 18, height 6' (1.829 m), weight 68.9 kg, SpO2 100 %. Body mass index is 20.61 kg/m.   Treatment Plan Summary: Daily contact with patient to assess and evaluate symptoms and progress in treatment  Spencer Mendoza is a 60 yr old male with a PPHx significant for MDD and EtOH use disorder, he was admitted under IVC for a suicide attempt via overdose on Trazodone.   He is improving.  He is able to recognize that he is making progress and the importance of therapy once he is discharged.   He is having some sweating but this should resolve over the next few days.  As both of his medications where increased yesterday will not make changes today.  We will discuss patient's preference of telephone therapy given his severe social phobia.  We will continue to monitor.    MDD, Recurrent, Severe w/out psychosis  GAD  PTSD: -Continue Effexor XR 75 mg tomorrow -Continue Remeron 15 mg QHS -Continue Buspar 7.5 mg BID     Epilepsy: -Continue Keppra 1000 mg BID     Withdrawal: -Continue CIWA -Continue Imodium 2-4 mg  -Continue Zofran-ODT 4 mg q6 PRN -Continue Thiamine 100 mg daily -Continue Folic Acid 1 mg daily -Continue Multivitamin daily     COPD: -Continue Breo  Ellipta 1 puff daily -Start Albuterol 1-2 puff q6 PRN     Nicotine Dependence: -Continue Nicotine Patch 21 mg daily     -Continue Aspirin 81 mg daily -Continue Pepcid 20 mg daily -Continue Rosuvastatin 10 mg daily -Continue Carafate 1 g TID with meals -Start Advil 600 mg TID PRN -Continue PRN's: Tylenol, Maalox, Atarax, Milk of Magnesia, Trazodone   Briant Cedar, MD 08/30/2021, 2:18 PM

## 2021-08-30 NOTE — Group Note (Signed)
LCSW Group Therapy Note   Group Date: 08/30/2021 Start Time: 1300 End Time: 1400   Type of Therapy and Topic:  Group Therapy:   Participation Level:  Did Not Attend  Summary of Progress/Problems: Did not attend   Darleen Crocker, LCSWA 08/30/2021  1:50 PM

## 2021-08-30 NOTE — [facility] Group Notes (Signed)
The focus of this group is to help patients establish daily goals to achieve during treatment and discuss how the patient can incorporate goal setting into their daily lives to aide in recovery.  Pt did not attend morning goals and orientation group.

## 2021-08-30 NOTE — Progress Notes (Signed)
Dar Note: Patient returned from recreational activity and report shortness of breath with wheezing. Albuterol inhaler 2 puffs given with good effect.  Vital signs obtained with oxygen saturation at 100 percent room air.  Patient is alert and oriented x 4.  Patient ambulated to his room without difficulty.  Md made aware.

## 2021-08-30 NOTE — Progress Notes (Signed)
   08/30/21 2137  Psych Admission Type (Psych Patients Only)  Admission Status Involuntary  Psychosocial Assessment  Patient Complaints Anxiety  Eye Contact Fair  Facial Expression Sullen;Pained  Affect Depressed;Anxious  Speech Logical/coherent  Interaction Assertive  Motor Activity Slow  Appearance/Hygiene In scrubs;Disheveled  Behavior Characteristics Cooperative;Appropriate to situation  Mood Depressed  Thought Process  Coherency Concrete thinking  Content Blaming others;Blaming self  Delusions None reported or observed  Perception WDL  Hallucination None reported or observed  Judgment Poor  Confusion None  Danger to Self  Current suicidal ideation? Denies  Self-Injurious Behavior No self-injurious ideation or behavior indicators observed or expressed   Danger to Others  Danger to Others None reported or observed

## 2021-08-30 NOTE — Progress Notes (Signed)
Patient did attend the evening speaker [place] meeting. PT was anxious, supportive, and engaged.

## 2021-08-31 LAB — HEMOGLOBIN A1C
Hgb A1c MFr Bld: 4.8 % (ref 4.8–5.6)
Mean Plasma Glucose: 91.06 mg/dL

## 2021-08-31 MED ORDER — CLONIDINE HCL 0.1 MG PO TABS
0.1000 mg | ORAL_TABLET | Freq: Once | ORAL | Status: AC
  Filled 2021-08-31: qty 1

## 2021-08-31 MED ORDER — DOCUSATE SODIUM 100 MG PO CAPS
100.0000 mg | ORAL_CAPSULE | Freq: Every day | ORAL | Status: DC | PRN
  Administered 2021-08-31: 100 mg via ORAL
  Filled 2021-08-31: qty 1

## 2021-08-31 MED ORDER — BUSPIRONE HCL 10 MG PO TABS
10.0000 mg | ORAL_TABLET | Freq: Two times a day (BID) | ORAL | Status: DC
  Administered 2021-08-31 – 2021-09-03 (×6): 10 mg via ORAL
  Filled 2021-08-31 (×10): qty 1

## 2021-08-31 MED ORDER — CLONIDINE HCL 0.1 MG PO TABS
ORAL_TABLET | ORAL | Status: AC
  Filled 2021-08-31: qty 1

## 2021-08-31 NOTE — Group Note (Signed)
Group Topic: Goal Setting  Group Date: 08/31/2021 Start Time: 0900 End Time: 1000 Facilitators: Paulino Rily, RN  Department: [facility] INPATIENT ADULT [facility]  Number of Participants: 13  Group Focus: affirmation, clarity of thought, and goals/reality orientation Treatment Modality:  Psychoeducation Interventions utilized were assignment, group exercise, and support Purpose: To be able to understand and verbalize the reason for their admission to the hospital. To understand that the medication helps with their chemical imbalance but they also need to work on their choices in life. To be challenged to develop a list of 30 positives about themselves. Also introduce the concept that "feelings" are not reality.  Name: Spencer Mendoza Date of Birth: 20-Jun-1961  MR: [id number]    Level of Participation: did not attend  Patients Problems:  Patient Active Problem List   Diagnosis Date Noted   Prolonged Q-T interval on ECG    Intentional overdose ([place]) 08/26/2021   Exanthem due to herpes zoster 04/01/2021   Abdominal aortic atherosclerosis (HCC) with 50% occlusion distal aorta 03/05/2020   CAD (coronary artery disease) 03/05/2020   Osteoarthritis of left wrist 01/04/2020   Liver hemangioma 09/21/2019   Abdominal pain 08/30/2019   Tobacco use 08/30/2019   COPD with chronic bronchitis ([place]) 08/30/2019   Chronic migraine without aura, with intractable migraine, so stated, with status migrainosus 08/25/2019   MDD (major depressive disorder), recurrent severe, without psychosis ([place]) 11/18/2018   Hemiplegic migraine with status migrainosus 06/06/2015   Chronic insomnia 06/06/2015   Seizures ([place]) 01/09/2014   Chronic low back pain 12/28/2013   History of stroke 12/04/2013   Hypertension 12/04/2013

## 2021-08-31 NOTE — Progress Notes (Addendum)
[facility] MD Progress Note  08/31/2021 1:21 PM Spencer Mendoza  MRN:  [id number] Subjective:   Spencer Mendoza is a 60 yr old male with a PPHx significant for MDD and EtOH use disorder, he was admitted under IVC for a suicide attempt via overdose on Trazodone.   Psychiatric team made the following recommendations yesterday: -Continue Effexor XR 75 mg. -Continue Remeron 15 mg QHS -Continue Buspar 7.5 mg to BID   Case was discussed in the multidisciplinary team. MAR was reviewed and patient was compliant with medications. Patient compliant with his medications.  He required as needed Ativan x2, Advil, and albuterol.  His last CIWA score was 6 (tremor, sweats, anxiety ) at 12 PM today.   On interview today patient reports an improved mood.  He reports that he slept well last night but woke up at least 4 times because of weird dreams last night and did not have good quality sleep.  He states in the dream he saw that he was building an apartment for his dad and fell down into [place].  He reports that his appetite is good and he has been eating all his meals.  He reports no active or passive SI, HI, or AVH.  Patient states he has claustrophobia and is not able to go in big groups with a lot of people in small room.  He states he went to [place] meeting last night because there were only 5 -6 patients there.  He states that he is hoping to start therapy after discharge.  He shares that he feels hopeless and helpless sometimes because of his history of seizures, anxiety and depression, severe migraine and REM sleep disorder.  He states he got kicked out of the [employer] for sleepwalking.  He states when he wakes up in the morning all the pillows and sheets are completely out of place because of his movement and acting out in sleep.  He states BuSpar is not helping him much with anxiety. He tried Benadryl and Xanax in the past which helped him with anxiety.  He states he does not abuse Xanax and was only taking it  occasionally when he needed it.  He states he has neurologist and tried multiple medication for migraine but the only thing helped was Botox.  He states insurance did not cover Botox after 2 sessions.  He states his goal for today is to be a better person and to say kind words to other patients.  He states he has some headache and some sweating.  Denies any nausea, vomiting.  He has been tolerating his medications and denies any side effects from medications. He reports no other concerns at this time.  Principal Problem: MDD (major depressive disorder), recurrent severe, without psychosis ([place]) Diagnosis: Principal Problem:   MDD (major depressive disorder), recurrent severe, without psychosis ([place])  Total Time spent with patient: 30 minutes  Past Psychiatric History: MDD and EtOH use disorder   Past Medical History:  Past Medical History:  Diagnosis Date   Alcohol withdrawal ([place]) 07/29/2013   Allergy    Anxiety    Arthritis    Benzodiazepine abuse ([place]) 08/30/2019   Bipolar disorder ([place])    CAD (coronary artery disease)    Chronic insomnia 06/06/2015   Chronic migraine without aura, with intractable migraine, so stated, with status migrainosus 08/25/2019   Claustrophobia    Cocaine abuse ([place]) 05/22/2015   COPD (chronic obstructive pulmonary disease) ([place])    Delirium tremens ([place]) 07/29/2013   Depression  Epilepsy ([place])    last seizure week of 06-25-2020- sev. grand mal seizures per pt    Hemiplegic migraine with status migrainosus 06/06/2015   Hypertension    Migraine    Opioid use disorder 08/30/2019   PONV (postoperative nausea and vomiting)    Stroke [facility])    patient denies- states had a hemiplegic migraine in ~2011   Substance abuse ([place])     Past Surgical History:  Procedure Laterality Date   ANKLE SURGERY     in high school   APPENDECTOMY     COLONOSCOPY     KNEE ARTHROSCOPY Right    NOSE SURGERY     Family History:  Family History  Problem Relation Age of  Onset   Heart disease Mother    Stroke Mother    Epilepsy Mother    Heart failure Mother    Cancer Father    Heart disease Father    Hypertension Father    Melanoma Father    Migraines Father    Colon cancer Sister 102   Colon cancer Cousin    Cancer Cousin    Colon polyps Neg Hx    Esophageal cancer Neg Hx    Rectal cancer Neg Hx    Stomach cancer Neg Hx    Family Psychiatric  History: None Social History:  Social History   Substance and Sexual Activity  Alcohol Use Not Currently   Alcohol/week: 6.0 standard drinks   Types: 6 Cans of beer per week   Comment: none since Sept 2020     Social History   Substance and Sexual Activity  Drug Use Not Currently   Types: Marijuana   Comment: pt denies    Social History   Socioeconomic History   Marital status: Legally Separated    Spouse name: Not on file   Number of children: 0   Years of education: GED   Highest education level: Not on file  Occupational History   Occupation: disabled  Tobacco Use   Smoking status: Every Day    Years: 15.00    Types: Cigarettes   Smokeless tobacco: Never   Tobacco comments:    Reports 2 cigarettes daily  Vaping Use   Vaping Use: Never used  Substance and Sexual Activity   Alcohol use: Not Currently    Alcohol/week: 6.0 standard drinks    Types: 6 Cans of beer per week    Comment: none since Sept 2020   Drug use: Not Currently    Types: Marijuana    Comment: pt denies   Sexual activity: Yes    Birth control/protection: Condom  Other Topics Concern   Not on file  Social History Narrative   Pt lives with sister.   Patient drinks 2 cups of caffeine daily.   Patient is right handed.   Social Determinants of Health   Financial Resource Strain: Not on file  Food Insecurity: Not on file  Transportation Needs: Not on file  Physical Activity: Not on file  Stress: Not on file  Social Connections: Not on file   Additional Social History:                          Sleep: Fair  Appetite:  Good  Current Medications: Current Facility-Administered Medications  Medication Dose Route Frequency Provider Last Rate Last Admin   acetaminophen (TYLENOL) tablet 650 mg  650 mg Oral Q6H PRN Ethelene Hal, NP   650 mg at 08/28/21  0827   albuterol (VENTOLIN HFA) 108 (90 Base) MCG/ACT inhaler 1-2 puff  1-2 puff Inhalation Q6H PRN Briant Cedar, MD   2 puff at 08/31/21 0846   alum & mag hydroxide-simeth (MAALOX/MYLANTA) 200-200-20 MG/5ML suspension 30 mL  30 mL Oral Q4H PRN Ethelene Hal, NP       aspirin EC tablet 81 mg  81 mg Oral Daily Ethelene Hal, NP   81 mg at 08/31/21 0843   busPIRone (BUSPAR) tablet 7.5 mg  7.5 mg Oral BID Massengill, Ovid Curd, MD   7.5 mg at 08/31/21 0842   famotidine (PEPCID) tablet 20 mg  20 mg Oral Daily Ethelene Hal, NP   20 mg at 08/31/21 0843   fluticasone furoate-vilanterol (BREO ELLIPTA) 200-25 MCG/INH 1 puff  1 puff Inhalation Daily Ethelene Hal, NP   1 puff at 22/29/79 8921   folic acid (FOLVITE) tablet 1 mg  1 mg Oral Daily Ethelene Hal, NP   1 mg at 08/31/21 1941   ibuprofen (ADVIL) tablet 600 mg  600 mg Oral TID PRN Janine Limbo, MD   600 mg at 08/30/21 2302   levETIRAcetam (KEPPRA) tablet 1,000 mg  1,000 mg Oral BID Ethelene Hal, NP   1,000 mg at 08/31/21 7408   lisinopril (ZESTRIL) tablet 5 mg  5 mg Oral Daily Massengill, Ovid Curd, MD   5 mg at 08/31/21 0841   LORazepam (ATIVAN) tablet 0.5 mg  0.5 mg Oral Daily PRN Briant Cedar, MD   0.5 mg at 08/31/21 1026   magnesium hydroxide (MILK OF MAGNESIA) suspension 30 mL  30 mL Oral Daily PRN Ethelene Hal, NP   30 mL at 08/31/21 1028   mirtazapine (REMERON) tablet 15 mg  15 mg Oral QHS Briant Cedar, MD   15 mg at 08/30/21 2137   multivitamin with minerals tablet 1 tablet  1 tablet Oral Daily Ethelene Hal, NP   1 tablet at 08/31/21 1448   nicotine (NICODERM CQ - dosed in mg/24  hours) patch 21 mg  21 mg Transdermal Daily Lavella Hammock, MD   21 mg at 08/31/21 0835   pantoprazole (PROTONIX) EC tablet 40 mg  40 mg Oral Daily Ethelene Hal, NP   40 mg at 08/31/21 0841   rosuvastatin (CRESTOR) tablet 10 mg  10 mg Oral Daily Ethelene Hal, NP   10 mg at 08/31/21 0841   sucralfate (CARAFATE) tablet 1 g  1 g Oral TID WC & HS Ethelene Hal, NP   1 g at 08/31/21 1204   thiamine (B-1) injection 100 mg  100 mg Intramuscular Once Lindell Spar I, NP       thiamine tablet 100 mg  100 mg Oral Daily Lindell Spar I, NP   100 mg at 08/31/21 0842   traZODone (DESYREL) tablet 50 mg  50 mg Oral QHS PRN Ethelene Hal, NP       venlafaxine XR (EFFEXOR-XR) 24 hr capsule 75 mg  75 mg Oral Q breakfast Briant Cedar, MD   75 mg at 08/31/21 1856    Lab Results:  Results for orders placed or performed during the hospital encounter of 08/27/21 (from the past 48 hour(s))  Hemoglobin A1c     Status: None   Collection Time: 08/31/21  6:37 AM  Result Value Ref Range   Hgb A1c MFr Bld 4.8 4.8 - 5.6 %    Comment: (NOTE) Pre diabetes:  5.7%-6.4%  Diabetes:              >6.4%  Glycemic control for   <7.0% adults with diabetes    Mean Plasma Glucose 91.06 mg/dL    Comment: Performed at [facility] [address], [place], [place] [address]    Blood Alcohol level:  Lab Results  Component Value Date   [facility] <10 08/26/2021   ETH <10 29/52/8413    Metabolic Disorder Labs: Lab Results  Component Value Date   HGBA1C 4.8 08/31/2021   MPG 91.06 08/31/2021   MPG 93.93 02/01/2018   Lab Results  Component Value Date   PROLACTIN 15.2 02/01/2018   Lab Results  Component Value Date   CHOL 142 08/28/2021   TRIG 91 08/28/2021   HDL 66 08/28/2021   CHOLHDL 2.2 08/28/2021   VLDL 18 08/28/2021   LDLCALC 58 08/28/2021   LDLCALC 50 07/13/2020    Physical Findings: CIWA:  CIWA-Ar Total: 6   Musculoskeletal: Strength & Muscle Tone:  within normal limits Gait & Station: normal Patient leans: N/A  Psychiatric Specialty Exam:  Presentation  General Appearance: Appropriate for Environment; Casual  Eye Contact:Fair  Speech:Clear and Coherent  Speech Volume:Normal  Handedness:Right   Mood and Affect  Mood:Anxious; Dysphoric; Hopeless; Irritable  Affect:Congruent   Thought Process  Thought Processes:Coherent  Descriptions of Associations:Intact  Orientation:Full (Time, Place and Person)  Thought Content:Logical; WDL  History of Schizophrenia/Schizoaffective disorder:No  Duration of Psychotic Symptoms:No data recorded Hallucinations:Hallucinations: None  Ideas of Reference:None  Suicidal Thoughts:Suicidal Thoughts: No  Homicidal Thoughts:Homicidal Thoughts: No   Sensorium  Memory:Immediate Good; Recent Good; Remote Good  Judgment:Poor  Insight:Poor   Executive Functions  Concentration:Good  Attention Span:Good  [place] of Knowledge:Good  Language:Good   Psychomotor Activity  Psychomotor Activity:Psychomotor Activity: Normal   Assets  Assets:Communication Skills; Desire for Improvement; Resilience   Sleep  Sleep:Sleep: Fair Number of Hours of Sleep: 3    Physical Exam: Physical Exam Vitals and nursing note reviewed.  Constitutional:      General: He is not in acute distress.    Appearance: Normal appearance. He is normal weight. He is not ill-appearing or toxic-appearing.  HENT:     Head: Normocephalic and atraumatic.  Pulmonary:     Effort: Pulmonary effort is normal.  Musculoskeletal:        General: Normal range of motion.  Neurological:     General: No focal deficit present.     Mental Status: He is alert.   Review of Systems  Constitutional:  Positive for diaphoresis.  Respiratory:  Negative for cough and shortness of breath.   Cardiovascular:  Negative for chest pain.  Gastrointestinal:  Negative for abdominal pain, constipation, diarrhea,  nausea and vomiting.  Neurological:  Positive for headaches. Negative for weakness.  Psychiatric/Behavioral:  Positive for depression (improving). Negative for hallucinations and suicidal ideas. The patient is nervous/anxious (improving).   Blood pressure (!) 145/106, pulse 95, temperature 97.6 F (36.4 C), resp. rate 16, height 6' (1.829 m), weight 68.9 kg, SpO2 100 %. Body mass index is 20.61 kg/m.   Treatment Plan Summary: Daily contact with patient to assess and evaluate symptoms and progress in treatment  Mr. Mahone is a 60 yr old male with a PPHx significant for MDD and EtOH use disorder, he was admitted under IVC for a suicide attempt via overdose on Trazodone.  He is improving.  He is able to recognize that he is making progress and the importance  of therapy once he is discharged.   He is having some sweating but this should resolve over the next few days.  He also complains of headache and has a history of migraines.  Will increase BuSpar to 10 mg twice daily to help with increased anxiety.  We will discuss patient's preference of telephone therapy given his severe social phobia.  We will continue to monitor.    MDD, Recurrent, Severe w/out psychosis  GAD  PTSD: -Continue Effexor XR 75 mg  -Continue Remeron 15 mg QHS -Increase Buspar to 10 mg BID     Epilepsy: -Continue Keppra 1000 mg BID     Withdrawal: -Continue CIWA -most recent 6 -Continue Imodium 2-4 mg  -Continue Zofran-ODT 4 mg q6 PRN -Continue Thiamine 100 mg daily -Continue Folic Acid 1 mg daily -Continue Multivitamin daily     COPD: -Continue Breo Ellipta 1 puff daily -Start Albuterol 1-2 puff q6 PRN     Nicotine Dependence: -Continue Nicotine Patch 21 mg daily     -Continue Aspirin 81 mg daily -Continue Pepcid 20 mg daily -Continue Rosuvastatin 10 mg daily -Continue Carafate 1 g TID with meals -Continue Advil 600 mg TID PRN for headache, migraine. -Continue PRN's: Tylenol, Maalox, Atarax,  Milk of Magnesia, Trazodone   Armando Reichert, MD PGY 2 08/31/2021, 1:21 PM

## 2021-08-31 NOTE — [facility] Group Notes (Signed)
Adult Psychoeducational Group Note  Date:  08/31/2021 Time:  9:48 AM  Group Topic/Focus:  Goals Group:   The focus of this group is to help patients establish daily goals to achieve during treatment and discuss how the patient can incorporate goal setting into their daily lives to aide in recovery.  Participation Level:  Did Not Attend  Loney Loh 08/31/2021, 9:48 AM

## 2021-08-31 NOTE — Progress Notes (Addendum)
   08/31/21 1800  Psych Admission Type (Psych Patients Only)  Admission Status Involuntary  Psychosocial Assessment  Patient Complaints Anxiety  Eye Contact Fair  Facial Expression Sullen  Affect Depressed;Anxious  Speech Logical/coherent  Interaction Assertive  Motor Activity Slow  Appearance/Hygiene In scrubs;Disheveled  Behavior Characteristics Cooperative;Appropriate to situation  Mood Depressed  Thought Process  Coherency Concrete thinking  Content Blaming others  Delusions None reported or observed  Perception WDL  Hallucination None reported or observed  Judgment Poor  Confusion None  Danger to Self  Current suicidal ideation? Denies  Self-Injurious Behavior No self-injurious ideation or behavior indicators observed or expressed   Danger to Others  Danger to Others None reported or observed  D. Pt presents with an anxious affect/ mood- cooperative behavior- friendly during interactions. Pt has been visible in the milieu interacting well with peers and staff, and observed attending groups. Pt currently denies SI/HI and AVH  A. Labs and vitals monitored. Pt given and educated on medications. Pt supported emotionally and encouraged to express concerns and ask questions.   R. Pt remains safe with 15 minute checks. Will continue POC.

## 2021-08-31 NOTE — [facility] Group Notes (Signed)
Date:  08/31/2021 Time:  2:01 PM   Group Topic/Focus:  Emotional Education:   The focus of this group is to discuss what feelings/emotions are, and how they are experienced.   Participation Level:  Active   Participation Quality:  Appropriate   Affect:  Appropriate   Cognitive:  Appropriate   Insight: Appropriate   Engagement in Group:  Engaged   Modes of Intervention:  Education   Additional Comments:  Pt attended group and was very active.

## 2021-08-31 NOTE — Progress Notes (Signed)
Pt's blood pressure hight, spoke with provider and received one time order for Clonidine 0.1 mg   08/31/21 2339  Psych Admission Type (Psych Patients Only)  Admission Status Involuntary  Psychosocial Assessment  Patient Complaints Anxiety  Eye Contact Fair  Facial Expression Anxious  Affect Anxious  Speech Logical/coherent  Interaction Assertive  Motor Activity Other (Comment) (WDL)  Appearance/Hygiene Disheveled  Behavior Characteristics Appropriate to situation  Mood Anxious  Thought Process  Coherency Concrete thinking  Content WDL  Delusions None reported or observed  Perception WDL  Hallucination None reported or observed  Judgment Poor  Confusion None  Danger to Self  Current suicidal ideation? Denies  Self-Injurious Behavior No self-injurious ideation or behavior indicators observed or expressed   Danger to Others  Danger to Others None reported or observed

## 2021-08-31 NOTE — [facility] Group Notes (Signed)
Pt did not attend Relaxation Group.

## 2021-08-31 NOTE — Progress Notes (Signed)
[facility] Group Notes:  (Nursing/MHT/Case Management/Adjunct)  Date:  08/31/2021  Time:  2000  Type of Therapy:   wrap up group  Participation Level:  Active  Participation Quality:  Appropriate, Attentive, Sharing, and Supportive  Affect:  Anxious and Depressed  Cognitive:  Alert  Insight:  Lacking  Engagement in Group:  Engaged  Modes of Intervention:  Clarification, Education, and Support  Summary of Progress/Problems: Positive thinking and positive change were discussed.   Spencer Mendoza 08/31/2021, 8:59 PM

## 2021-09-01 MED ORDER — NICOTINE 7 MG/24HR TD PT24
7.0000 mg | MEDICATED_PATCH | Freq: Every day | TRANSDERMAL | Status: DC
  Administered 2021-09-01 – 2021-09-03 (×2): 7 mg via TRANSDERMAL
  Filled 2021-09-01 (×5): qty 1

## 2021-09-01 MED ORDER — TRAZODONE HCL 50 MG PO TABS
50.0000 mg | ORAL_TABLET | Freq: Once | ORAL | Status: AC
  Filled 2021-09-01: qty 1

## 2021-09-01 MED ORDER — POLYETHYLENE GLYCOL 3350 17 G PO PACK: 17.0000 g | PACK | Freq: Every day | ORAL | Status: DC | PRN

## 2021-09-01 MED ORDER — MAGNESIUM HYDROXIDE 400 MG/5ML PO SUSP: 5.0000 mL | Freq: Every day | ORAL | Status: DC | PRN

## 2021-09-01 MED ORDER — CLONIDINE HCL 0.1 MG PO TABS
0.1000 mg | ORAL_TABLET | Freq: Once | ORAL | Status: AC
  Administered 2021-09-01: 0.1 mg via ORAL
  Filled 2021-09-01 (×2): qty 1

## 2021-09-01 NOTE — Group Note (Signed)
Group Topic: PROGRESSIVE RELAXATION. A group where deep breathing is taught and tensing and relaxation muscle groups is used. Imagery is used as well.  Pts are asked to imagine 3 pillars that hold them up when they are not able to hold themselves up. Group Date: 09/01/2021 Start Time: 0900 End Time: 1000 Facilitators: Paulino Rily, RN  Department: [place]  Number of Participants: 16  Group Focus: anxiety and clarity of thought Treatment Modality:  Cognitive Behavioral Therapy, Skills Training, and Spiritual Interventions utilized were exploration and support Purpose: enhance coping skills and reinforce self-care  Name: Spencer Mendoza Date of Birth: [birth year]  MR: [id number]    Level of Participation: Did not attend Patients Problems:  Patient Active Problem List   Diagnosis Date Noted   Prolonged Q-T interval on ECG    Intentional overdose ([place]) 08/26/2021   Exanthem due to herpes zoster 04/01/2021   Abdominal aortic atherosclerosis ([place]) with 50% occlusion distal aorta 03/05/2020   CAD (coronary artery disease) 03/05/2020   Osteoarthritis of left wrist 01/04/2020   Liver hemangioma 09/21/2019   Abdominal pain 08/30/2019   Tobacco use 08/30/2019   COPD with chronic bronchitis ([place]) 08/30/2019   Chronic migraine without aura, with intractable migraine, so stated, with status migrainosus 08/25/2019   MDD (major depressive disorder), recurrent severe, without psychosis ([place]) 11/18/2018   Hemiplegic migraine with status migrainosus 06/06/2015   Chronic insomnia 06/06/2015   Seizures ([place]) 01/09/2014   Chronic low back pain 12/28/2013   History of stroke 12/04/2013   Hypertension 12/04/2013

## 2021-09-01 NOTE — Progress Notes (Signed)
Patient said he wants to walk up and down the hallway. Writer explain to the patient he can not walk up and down the hallway because he can not wake the other patients that are asleep. He can walk around in his room. Patient said, "start to talk loud: patient said that do not make no damn since he should be able to do what the fuck he want to do. Patient appear to be upset. Writer explain this is a hospital and other patients has a right for quiet. RN notify.

## 2021-09-01 NOTE — Group Note (Addendum)
  [facility] LCSW Group Therapy Note  Date/Time:  09/01/2021 10:00AM-11:00AM  Type of Therapy and Topic:  Group Therapy:  Self-Care Wheel  Participation Level:  Did Not Attend   Description of Group This process group involved patients discussing the importance of self-care in different areas of life (professional, personal, emotional, psychological, spiritual, and physical) in order to achieve healthy life balance.  The group talked about what self-care in each of those areas would constitute and then specifically listed how they want to provide themselves with improved self-care.  Therapeutic Goals Patient will learn how to break self-care down into various areas of life Patient will participate in generating ideas about healthy self-care options in each category Patients will be supportive of one another and receive support from others Patient will identify one healthy self-care activity to add to his/her life   Summary of Patient Progress:  The patient was invited to group, did not attend.  Therapeutic Modalities Processing Psychoeducation   Selmer Dominion, LCSW 09/01/2021 2:59 PM

## 2021-09-01 NOTE — Progress Notes (Signed)
   09/01/21 1400  Psych Admission Type (Psych Patients Only)  Admission Status Involuntary  Psychosocial Assessment  Patient Complaints Anxiety  Eye Contact Fair  Facial Expression Anxious  Affect Anxious  Speech Logical/coherent  Interaction Assertive  Motor Activity Other (Comment) (WDL)  Appearance/Hygiene Disheveled  Behavior Characteristics Cooperative;Fidgety;Anxious  Mood Anxious  Thought Process  Coherency Concrete thinking  Content WDL  Delusions None reported or observed  Perception WDL  Hallucination None reported or observed  Judgment Poor  Confusion None  Danger to Self  Current suicidal ideation? Denies  Self-Injurious Behavior No self-injurious ideation or behavior indicators observed or expressed   Danger to Others  Danger to Others None reported or observed

## 2021-09-01 NOTE — Group Note (Signed)
Late Note for 08/31/2021  Clinical Social Work Note  No group could be held this day due to low staffing.  Other licensed group was held.  Selmer Dominion, LCSW 08/31/2021

## 2021-09-01 NOTE — [facility] Group Notes (Signed)
Pt did not attend the morning group with the nurse.

## 2021-09-01 NOTE — Progress Notes (Signed)
[facility] Group Notes:  (Nursing/MHT/Case Management/Adjunct)  Date:  09/01/2021  Time:  2015  Type of Therapy:   wrap up group  Participation Level:  Active  Participation Quality:  Appropriate, Attentive, Sharing, and Supportive  Affect:  Anxious  Cognitive:  Alert  Insight:  Improving  Engagement in Group:  Engaged  Modes of Intervention:  Clarification, Education, and Support  Summary of Progress/Problems: Positive thinking and self-care were discussed.   Winfield Rast S 09/01/2021, 9:02 PM

## 2021-09-01 NOTE — Progress Notes (Addendum)
[facility] MD Progress Note  09/01/2021 12:02 PM Spencer Mendoza  MRN:  [id number] Subjective:   Spencer Mendoza is a 60 yr old male with a PPHx significant for MDD and EtOH use disorder, he was admitted under IVC for a suicide attempt via overdose on Trazodone.  Psychiatric team made the following recommendations yesterday: -Continue Effexor XR 75 mg. -Continue Remeron 15 mg QHS -Increase Buspar to 10 mg to BID  Case was discussed in the multidisciplinary team. MAR was reviewed and patient was compliant with medications.He required as needed albuterol, Colace, Advil, Ativan, milk of magnesia, trazodone.  He also needed one-time dose of clonidine point 1 mg for high blood pressure.  His last CIWA score was 6 (tremor, anxiety, headache, agitation ) at 6 AM today.  Per nursing notes, at 4:44 AM patient was up and wanted to walk up and down the hallway when he was explained that he could not walk up and down the hallway.  Patient stated "start to talk loud: patient said that do not make no damn since he should be able to do what the fuck he want to do".  This morning, when I tried to talk to patient, patient got agitated, states "I do not want to talk to you ".  "Get out of my room ".Patient was seen again with Dr. Nelda Marseille and was more cooperative.  On interview today patient reports an improved mood.  He reports that he slept well last night and woke woke up around 4:44 AM.  He states his blood pressure was also high at night and he got a dose of clonidine.He states now his blood pressure is under control. He reports no active or passive SI, HI, or AVH. He reports that his appetite is good and he has been eating all his meals.  He reports constipation. Denies any nausea, vomiting.  He states he tried Colace, milk of magnesia, prune juice and has been drinking a lot of water but still has not had any bowel movement.  Discussed adding MiraLAX.  Patient agrees with the plan.  He has been tolerating his  medications and denies any side effects from medications. He reports no other concerns at this time.  Principal Problem: MDD (major depressive disorder), recurrent severe, without psychosis ([place]) Diagnosis: Principal Problem:   MDD (major depressive disorder), recurrent severe, without psychosis ([place])  Total Time spent with patient: 30 minutes  Past Psychiatric History: MDD and EtOH use disorder   Past Medical History:  Past Medical History:  Diagnosis Date   Alcohol withdrawal ([place]) 07/29/2013   Allergy    Anxiety    Arthritis    Benzodiazepine abuse ([place]) 08/30/2019   Bipolar disorder ([place])    CAD (coronary artery disease)    Chronic insomnia 06/06/2015   Chronic migraine without aura, with intractable migraine, so stated, with status migrainosus 08/25/2019   Claustrophobia    Cocaine abuse ([place]) 05/22/2015   COPD (chronic obstructive pulmonary disease) ([place])    Delirium tremens ([place]) 07/29/2013   Depression    Epilepsy ([place])    last seizure week of 06-25-2020- sev. grand mal seizures per pt    Hemiplegic migraine with status migrainosus 06/06/2015   Hypertension    Migraine    Opioid use disorder 08/30/2019   PONV (postoperative nausea and vomiting)    Stroke [facility])    patient denies- states had a hemiplegic migraine in ~2011   Substance abuse [facility])     Past Surgical History:  Procedure Laterality  Date   ANKLE SURGERY     in high school   APPENDECTOMY     COLONOSCOPY     KNEE ARTHROSCOPY Right    NOSE SURGERY     Family History:  Family History  Problem Relation Age of Onset   Heart disease Mother    Stroke Mother    Epilepsy Mother    Heart failure Mother    Cancer Father    Heart disease Father    Hypertension Father    Melanoma Father    Migraines Father    Colon cancer Sister 23   Colon cancer Cousin    Cancer Cousin    Colon polyps Neg Hx    Esophageal cancer Neg Hx    Rectal cancer Neg Hx    Stomach cancer Neg Hx    Family Psychiatric  History:  None Social History:  Social History   Substance and Sexual Activity  Alcohol Use Not Currently   Alcohol/week: 6.0 standard drinks   Types: 6 Cans of beer per week   Comment: none since Sept 2020     Social History   Substance and Sexual Activity  Drug Use Not Currently   Types: Marijuana   Comment: pt denies    Social History   Socioeconomic History   Marital status: Legally Separated    Spouse name: Not on file   Number of children: 0   Years of education: GED   Highest education level: Not on file  Occupational History   Occupation: disabled  Tobacco Use   Smoking status: Every Day    Years: 15.00    Types: Cigarettes   Smokeless tobacco: Never   Tobacco comments:    Reports 2 cigarettes daily  Vaping Use   Vaping Use: Never used  Substance and Sexual Activity   Alcohol use: Not Currently    Alcohol/week: 6.0 standard drinks    Types: 6 Cans of beer per week    Comment: none since Sept 2020   Drug use: Not Currently    Types: Marijuana    Comment: pt denies   Sexual activity: Yes    Birth control/protection: Condom  Other Topics Concern   Not on file  Social History Narrative   Pt lives with sister.   Patient drinks 2 cups of caffeine daily.   Patient is right handed.   Social Determinants of Health   Financial Resource Strain: Not on file  Food Insecurity: Not on file  Transportation Needs: Not on file  Physical Activity: Not on file  Stress: Not on file  Social Connections: Not on file   Sleep: Fair  Appetite:  Good  Current Medications: Current Facility-Administered Medications  Medication Dose Route Frequency Provider Last Rate Last Admin   acetaminophen (TYLENOL) tablet 650 mg  650 mg Oral Q6H PRN Ethelene Hal, NP   650 mg at 08/28/21 0827   albuterol (VENTOLIN HFA) 108 (90 Base) MCG/ACT inhaler 1-2 puff  1-2 puff Inhalation Q6H PRN Briant Cedar, MD   2 puff at 08/31/21 0846   alum & mag hydroxide-simeth  (MAALOX/MYLANTA) 200-200-20 MG/5ML suspension 30 mL  30 mL Oral Q4H PRN Ethelene Hal, NP       aspirin EC tablet 81 mg  81 mg Oral Daily Ethelene Hal, NP   81 mg at 09/01/21 0759   busPIRone (BUSPAR) tablet 10 mg  10 mg Oral BID Armando Reichert, MD   10 mg at 09/01/21 0759   docusate sodium (  COLACE) capsule 100 mg  100 mg Oral Daily PRN Armando Reichert, MD   100 mg at 08/31/21 1834   famotidine (PEPCID) tablet 20 mg  20 mg Oral Daily Ethelene Hal, NP   20 mg at 09/01/21 0759   fluticasone furoate-vilanterol (BREO ELLIPTA) 200-25 MCG/INH 1 puff  1 puff Inhalation Daily Ethelene Hal, NP   1 puff at 62/56/38 9373   folic acid (FOLVITE) tablet 1 mg  1 mg Oral Daily Ethelene Hal, NP   1 mg at 09/01/21 0759   ibuprofen (ADVIL) tablet 600 mg  600 mg Oral TID PRN Janine Limbo, MD   600 mg at 08/31/21 2112   levETIRAcetam (KEPPRA) tablet 1,000 mg  1,000 mg Oral BID Ethelene Hal, NP   1,000 mg at 09/01/21 0759   lisinopril (ZESTRIL) tablet 5 mg  5 mg Oral Daily Massengill, Ovid Curd, MD   5 mg at 09/01/21 0759   LORazepam (ATIVAN) tablet 0.5 mg  0.5 mg Oral Daily PRN Briant Cedar, MD   0.5 mg at 08/31/21 1026   magnesium hydroxide (MILK OF MAGNESIA) suspension 30 mL  30 mL Oral Daily PRN Ethelene Hal, NP   30 mL at 08/31/21 1028   mirtazapine (REMERON) tablet 15 mg  15 mg Oral QHS Briant Cedar, MD   15 mg at 08/31/21 2112   multivitamin with minerals tablet 1 tablet  1 tablet Oral Daily Ethelene Hal, NP   1 tablet at 09/01/21 0759   nicotine (NICODERM CQ - dosed in mg/24 hr) patch 7 mg  7 mg Transdermal Daily Nelda Marseille, Delissa Silba E, MD   7 mg at 09/01/21 0808   pantoprazole (PROTONIX) EC tablet 40 mg  40 mg Oral Daily Ethelene Hal, NP   40 mg at 09/01/21 0759   polyethylene glycol (MIRALAX / GLYCOLAX) packet 17 g  17 g Oral Daily PRN Armando Reichert, MD       rosuvastatin (CRESTOR) tablet 10 mg  10 mg Oral Daily Ethelene Hal, NP   10 mg at 09/01/21 0801   sucralfate (CARAFATE) tablet 1 g  1 g Oral TID WC & HS Ethelene Hal, NP   1 g at 09/01/21 0757   thiamine (B-1) injection 100 mg  100 mg Intramuscular Once Lindell Spar I, NP       thiamine tablet 100 mg  100 mg Oral Daily Lindell Spar I, NP   100 mg at 09/01/21 0757   traZODone (DESYREL) tablet 50 mg  50 mg Oral QHS PRN Ethelene Hal, NP   50 mg at 08/31/21 2112   venlafaxine XR (EFFEXOR-XR) 24 hr capsule 75 mg  75 mg Oral Q breakfast Briant Cedar, MD   75 mg at 09/01/21 4287    Lab Results:  Results for orders placed or performed during the hospital encounter of 08/27/21 (from the past 48 hour(s))  Hemoglobin A1c     Status: None   Collection Time: 08/31/21  6:37 AM  Result Value Ref Range   Hgb A1c MFr Bld 4.8 4.8 - 5.6 %    Comment: (NOTE) Pre diabetes:          5.7%-6.4%  Diabetes:              >6.4%  Glycemic control for   <7.0% adults with diabetes    Mean Plasma Glucose 91.06 mg/dL    Comment: Performed at [facility], [address],  [place] [address]    Blood Alcohol level:  Lab Results  Component Value Date   ETH <10 08/26/2021   ETH <10 37/85/8850    Metabolic Disorder Labs: Lab Results  Component Value Date   HGBA1C 4.8 08/31/2021   MPG 91.06 08/31/2021   MPG 93.93 02/01/2018   Lab Results  Component Value Date   PROLACTIN 15.2 02/01/2018   Lab Results  Component Value Date   CHOL 142 08/28/2021   TRIG 91 08/28/2021   HDL 66 08/28/2021   CHOLHDL 2.2 08/28/2021   VLDL 18 08/28/2021   LDLCALC 58 08/28/2021   LDLCALC 50 07/13/2020    Physical Findings: CIWA:  CIWA-Ar Total: 6   Musculoskeletal: Strength & Muscle Tone: within normal limits Gait & Station: normal Patient leans: N/A  Psychiatric Specialty Exam:  Presentation  General Appearance: Appropriate for Environment; Casual  Eye Contact:Good  Speech:Clear and Coherent  Speech  Volume:Normal  Handedness:Right   Mood and Affect  Mood: initially irritable but as interview progressed was calmer and more euthymic  Affect:Initially irritable but became calmer, more polite, and brighter appearing by end of interview   Thought Process  Thought Processes:Coherent, goal directed  Descriptions of Associations:Intact  Orientation:Full (Time, Place and Person)  Thought Content: Logical - denies AVH,paranoia, or delusions  History of Schizophrenia/Schizoaffective disorder:No  Duration of Psychotic Symptoms:No data recorded Hallucinations:Hallucinations: None  Ideas of Reference:None  Suicidal Thoughts:Suicidal Thoughts: No  Homicidal Thoughts:Homicidal Thoughts: No   Sensorium  Memory:Immediate Good; Recent Good; Remote Good  Judgment:Fair  Insight:Fair   Executive Functions  Concentration:Good  Attention Span:Good  [place] of Knowledge:Good  Language:Good   Psychomotor Activity  Psychomotor Activity:Psychomotor Activity: Normal   Assets  Assets:Communication Skills; Desire for Improvement; Resilience   Sleep  Sleep:Sleep: Fair Number of Hours of Sleep: 4.25  Physical Exam Vitals and nursing note reviewed.  Constitutional:      General: He is not in acute distress.    Appearance: Normal appearance. He is normal weight. He is not ill-appearing or toxic-appearing.  HENT:     Head: Normocephalic and atraumatic.  Pulmonary:     Effort: Pulmonary effort is normal.  Neurological:     General: No focal deficit present.     Mental Status: He is alert.   Review of Systems  Constitutional:  Negative for diaphoresis.  Respiratory:  Negative for cough and shortness of breath.   Cardiovascular:  Negative for chest pain.  Gastrointestinal:  Positive for constipation. Negative for abdominal pain, diarrhea, nausea and vomiting.  Neurological:  Negative for weakness and headaches.  Psychiatric/Behavioral:  Positive for  depression (improving). Negative for hallucinations and suicidal ideas. The patient is nervous/anxious (improving).   Blood pressure (!) 128/92, pulse (!) 54, temperature 97.6 F (36.4 C), resp. rate 18, height 6' (1.829 m), weight 68.9 kg, SpO2 99 %. Body mass index is 20.61 kg/m.   Treatment Plan Summary: Daily contact with patient to assess and evaluate symptoms and progress in treatment  Mr. Larsson is a 60 yr old male with a PPHx significant for MDD and EtOH use disorder, he was admitted under IVC for a suicide attempt via overdose on Trazodone.  Nursing staff reported some irritability and feeling angry today early morning.  He is reporting improved mood.  He is able to recognize that he is making progress and the importance of therapy once he is discharged.   His blood pressure was high last night at 168/107 mmHg.  He received one-time dose of  clonidine 0.1 mg.  Patient reports constipation.  He took Colace, milk of magnesia, prune juice.  We will add MiraLAX.  We will continue to monitor.    MDD, Recurrent, Severe w/out psychosis  GAD  PTSD: -Continue Effexor XR 75 mg (monitoring BP on med) -Continue Remeron 15 mg QHS -Continue Buspar  10 mg BID   Epilepsy: -Continue Keppra 1000 mg BID   Alcohol Use d/o: -Continue CIWA protocol-most recent score 6 -Continue Imodium 2-4 mg  -Continue Zofran-ODT 4 mg q6 PRN -Continue Thiamine 100 mg daily -Continue Folic Acid 1 mg daily -Continue Multivitamin daily   Constipation -Continue Colace 100 mg daily as needed for constipation -Continue milk of magnesia as needed -Add MiraLAX 17 gm daily as needed -Encourage fluid intake  Hypertension -Continue lisinopril 5 mg daily -Monitor vitals every 6 hours -Consider increasing lisinopril to 10 mg daily if blood pressure continues to remain high.  COPD: -Continue Breo Ellipta 1 puff daily -Start Albuterol 1-2 puff q6 PRN     Nicotine Dependence: -Continue Nicotine Patch 21 mg  daily     -Continue Aspirin 81 mg daily -Continue Pepcid 20 mg daily -Continue Rosuvastatin 10 mg daily -Continue Carafate 1 g TID with meals -Continue Advil 600 mg TID PRN for headache, migraine. -Continue PRN's: Tylenol, Maalox, Atarax, Milk of Magnesia, Trazodone   Armando Reichert, MD PGY 2 09/01/2021, 12:02 PM

## 2021-09-01 NOTE — BHH Group Notes (Signed)
Pt did not attend Relaxation Group this afternoon.

## 2021-09-02 ENCOUNTER — Encounter (HOSPITAL_COMMUNITY): Payer: Self-pay

## 2021-09-02 ENCOUNTER — Ambulatory Visit: Payer: Medicare Other | Admitting: Critical Care Medicine

## 2021-09-02 LAB — BASIC METABOLIC PANEL
Anion gap: 5 (ref 5–15)
BUN: 8 mg/dL (ref 8–23)
CO2: 31 mmol/L (ref 22–32)
Calcium: 9.3 mg/dL (ref 8.9–10.3)
Chloride: 103 mmol/L (ref 98–111)
Creatinine, Ser: 0.84 mg/dL (ref 0.61–1.24)
GFR, Estimated: >60 mL/min (ref >60)
Glucose, Bld: 92 mg/dL (ref 70–99)
Potassium: 3.1 mmol/L — ABNORMAL LOW (ref 3.5–5.1)
Sodium: 139 mmol/L (ref 135–145)

## 2021-09-02 MED ORDER — LISINOPRIL 10 MG PO TABS
10.0000 mg | ORAL_TABLET | Freq: Every day | ORAL | Status: DC
  Administered 2021-09-03: 10 mg via ORAL
  Filled 2021-09-02 (×3): qty 1

## 2021-09-02 MED ORDER — LISINOPRIL 5 MG PO TABS
5.0000 mg | ORAL_TABLET | Freq: Once | ORAL | Status: AC
  Administered 2021-09-02: 5 mg via ORAL
  Filled 2021-09-02: qty 1

## 2021-09-02 NOTE — BH IP Treatment Plan (Unsigned)
Interdisciplinary Treatment and Diagnostic Plan Update  09/02/2021 Time of Session: 2:10pm Spencer Mendoza MRN: [id number]  Principal Diagnosis: MDD (major depressive disorder), recurrent severe, without psychosis ([place])  Secondary Diagnoses: Principal Problem:   MDD (major depressive disorder), recurrent severe, without psychosis ([place])   Current Medications:  Current Facility-Administered Medications  Medication Dose Route Frequency Provider Last Rate Last Admin   acetaminophen (TYLENOL) tablet 650 mg  650 mg Oral Q6H PRN Ethelene Hal, NP   650 mg at 09/01/21 1827   albuterol (VENTOLIN HFA) 108 (90 Base) MCG/ACT inhaler 1-2 puff  1-2 puff Inhalation Q6H PRN Briant Cedar, MD   2 puff at 09/02/21 0818   alum & mag hydroxide-simeth (MAALOX/MYLANTA) 200-200-20 MG/5ML suspension 30 mL  30 mL Oral Q4H PRN Ethelene Hal, NP       aspirin EC tablet 81 mg  81 mg Oral Daily Ethelene Hal, NP   81 mg at 09/02/21 0814   busPIRone (BUSPAR) tablet 10 mg  10 mg Oral BID Armando Reichert, MD   10 mg at 09/02/21 9924   docusate sodium (COLACE) capsule 100 mg  100 mg Oral Daily PRN Armando Reichert, MD   100 mg at 08/31/21 1834   famotidine (PEPCID) tablet 20 mg  20 mg Oral Daily Ethelene Hal, NP   20 mg at 09/02/21 0815   fluticasone furoate-vilanterol (BREO ELLIPTA) 200-25 MCG/INH 1 puff  1 puff Inhalation Daily Ethelene Hal, NP   1 puff at 26/83/41 9622   folic acid (FOLVITE) tablet 1 mg  1 mg Oral Daily Ethelene Hal, NP   1 mg at 09/02/21 0815   ibuprofen (ADVIL) tablet 600 mg  600 mg Oral TID PRN Janine Limbo, MD   600 mg at 08/31/21 2112   levETIRAcetam (KEPPRA) tablet 1,000 mg  1,000 mg Oral BID Ethelene Hal, NP   1,000 mg at 09/02/21 0814   [START ON 09/03/2021] lisinopril (ZESTRIL) tablet 10 mg  10 mg Oral Daily Briant Cedar, MD       LORazepam (ATIVAN) tablet 0.5 mg  0.5 mg Oral Daily PRN Briant Cedar, MD    0.5 mg at 09/01/21 1329   magnesium hydroxide (MILK OF MAGNESIA) suspension 5 mL  5 mL Oral Daily PRN Harlow Asa, MD       mirtazapine (REMERON) tablet 15 mg  15 mg Oral QHS Briant Cedar, MD   15 mg at 09/01/21 2112   multivitamin with minerals tablet 1 tablet  1 tablet Oral Daily Ethelene Hal, NP   1 tablet at 09/02/21 2979   nicotine (NICODERM CQ - dosed in mg/24 hr) patch 7 mg  7 mg Transdermal Daily Nelda Marseille, Amy E, MD   7 mg at 09/01/21 0808   pantoprazole (PROTONIX) EC tablet 40 mg  40 mg Oral Daily Ethelene Hal, NP   40 mg at 09/02/21 0815   polyethylene glycol (MIRALAX / GLYCOLAX) packet 17 g  17 g Oral Daily PRN Armando Reichert, MD       rosuvastatin (CRESTOR) tablet 10 mg  10 mg Oral Daily Ethelene Hal, NP   10 mg at 09/02/21 0815   sucralfate (CARAFATE) tablet 1 g  1 g Oral TID WC & HS Ethelene Hal, NP   1 g at 09/02/21 1207   thiamine tablet 100 mg  100 mg Oral Daily Lindell Spar I, NP   100 mg at 09/02/21 0814   traZODone ([place])  tablet 50 mg  50 mg Oral QHS PRN Ethelene Hal, NP   50 mg at 09/01/21 2333   venlafaxine XR (EFFEXOR-XR) 24 hr capsule 75 mg  75 mg Oral Q breakfast Briant Cedar, MD   75 mg at 09/02/21 9735   PTA Medications: Medications Prior to Admission  Medication Sig Dispense Refill Last Dose   ALPRAZolam (XANAX) 0.25 MG tablet Take 0.25 mg by mouth 2 (two) times daily as needed for anxiety.   Past Month   albuterol (VENTOLIN HFA) 108 (90 Base) MCG/ACT inhaler INHALE 2 PUFFS BY MOUTH INTO THE LUNGS EVERY 4 HOURS AS NEEDED FOR SHORTNESS OF BREATH OR WHEEZING 8.5 g 3    aspirin EC 81 MG tablet Take 1 tablet (81 mg total) by mouth daily. Swallow whole. 30 tablet 11    budesonide-formoterol (SYMBICORT) 160-4.5 MCG/ACT inhaler Inhale 2 puffs into the lungs 2 (two) times daily. 10.2 g 5    busPIRone (BUSPAR) 7.5 MG tablet TAKE 1 TABLET(7.5 MG) BY MOUTH DAILY 30 tablet 1    famotidine (PEPCID) 20 MG tablet  TAKE 1 TABLET(20 MG) BY MOUTH AT BEDTIME 90 tablet 1    folic acid (FOLVITE) 1 MG tablet Take 1 tablet (1 mg total) by mouth daily. 30 tablet 3    levETIRAcetam (KEPPRA) 1000 MG tablet Take 1 tablet (1,000 mg total) by mouth 2 (two) times daily. 60 tablet 11    ondansetron (ZOFRAN ODT) 4 MG disintegrating tablet Dissolve 1 tablet every 4 hours as needed for nausea/vomit 14 tablet 0    pantoprazole (PROTONIX) 40 MG tablet TAKE 1 TABLET(40 MG) BY MOUTH DAILY 30 MINUTES BEFORE BREAKFAST 90 tablet 1    rosuvastatin (CRESTOR) 10 MG tablet Take 1 tablet (10 mg total) by mouth daily. 90 tablet 3    thiamine 100 MG tablet Take 1 tablet (100 mg total) by mouth daily. 30 tablet 3    traZODone (DESYREL) 100 MG tablet TAKE 2 TABLETS(200 MG) BY MOUTH AT BEDTIME 60 tablet 2     Patient Stressors: Financial difficulties   Health problems   Marital or family conflict   Medication change or noncompliance    Patient Strengths: Ability for insight  Communication skills  Supportive family/friends   Treatment Modalities: Medication Management, Group therapy, Case management,  1 to 1 session with clinician, Psychoeducation, Recreational therapy.   Physician Treatment Plan for Primary Diagnosis: MDD (major depressive disorder), recurrent severe, without psychosis ([place]) Long Term Goal(s): Improvement in symptoms so as ready for discharge   Short Term Goals: Ability to identify changes in lifestyle to reduce recurrence of condition will improve Ability to verbalize feelings will improve Ability to disclose and discuss suicidal ideas Ability to demonstrate self-control will improve Ability to identify and develop effective coping behaviors will improve Ability to maintain clinical measurements within normal limits will improve Compliance with prescribed medications will improve Ability to identify triggers associated with substance abuse/mental health issues will improve  Medication Management: Evaluate  patient's response, side effects, and tolerance of medication regimen.  Therapeutic Interventions: 1 to 1 sessions, Unit Group sessions and Medication administration.  Evaluation of Outcomes: Progressing  Physician Treatment Plan for Secondary Diagnosis: Principal Problem:   MDD (major depressive disorder), recurrent severe, without psychosis ([place])  Long Term Goal(s): Improvement in symptoms so as ready for discharge   Short Term Goals: Ability to identify changes in lifestyle to reduce recurrence of condition will improve Ability to verbalize feelings will improve Ability to disclose and discuss  suicidal ideas Ability to demonstrate self-control will improve Ability to identify and develop effective coping behaviors will improve Ability to maintain clinical measurements within normal limits will improve Compliance with prescribed medications will improve Ability to identify triggers associated with substance abuse/mental health issues will improve     Medication Management: Evaluate patient's response, side effects, and tolerance of medication regimen.  Therapeutic Interventions: 1 to 1 sessions, Unit Group sessions and Medication administration.  Evaluation of Outcomes: Progressing   RN Treatment Plan for Primary Diagnosis: MDD (major depressive disorder), recurrent severe, without psychosis ([place]) Long Term Goal(s): Knowledge of disease and therapeutic regimen to maintain health will improve  Short Term Goals: Ability to remain free from injury will improve, Ability to verbalize frustration and anger appropriately will improve, Ability to identify and develop effective coping behaviors will improve, and Compliance with prescribed medications will improve  Medication Management: RN will administer medications as ordered by provider, will assess and evaluate patient's response and provide education to patient for prescribed medication. RN will report any adverse and/or side effects to  prescribing provider.  Therapeutic Interventions: 1 on 1 counseling sessions, Psychoeducation, Medication administration, Evaluate responses to treatment, Monitor vital signs and CBGs as ordered, Perform/monitor CIWA, COWS, AIMS and Fall Risk screenings as ordered, Perform wound care treatments as ordered.  Evaluation of Outcomes: Progressing   LCSW Treatment Plan for Primary Diagnosis: MDD (major depressive disorder), recurrent severe, without psychosis ([place]) Long Term Goal(s): Safe transition to appropriate next level of care at discharge, Engage patient in therapeutic group addressing interpersonal concerns.  Short Term Goals: Engage patient in aftercare planning with referrals and resources, Increase social support, Increase ability to appropriately verbalize feelings, Increase emotional regulation, Identify triggers associated with mental health/substance abuse issues, and Increase skills for wellness and recovery  Therapeutic Interventions: Assess for all discharge needs, 1 to 1 time with Social worker, Explore available resources and support systems, Assess for adequacy in community support network, Educate family and significant other(s) on suicide prevention, Complete Psychosocial Assessment, Interpersonal group therapy.  Evaluation of Outcomes: Progressing   Progress in Treatment: Attending groups: Yes. Participating in groups: Yes. Taking medication as prescribed: Yes. Toleration medication: Yes. Family/Significant other contact made: Yes, individual(s) contacted:  girlfriend Patient understands diagnosis: Yes. Discussing patient identified problems/goals with staff: Yes. Medical problems stabilized or resolved: Yes. Denies suicidal/homicidal ideation: Yes. Issues/concerns per patient self-inventory: No.   New problem(s) identified: No, Describe:  none  New Short Term/Long Term Goal(s): detox, medication management for mood stabilization; elimination of SI thoughts;  development of comprehensive mental wellness/sobriety plan  Patient Goals:  "To go home"  Discharge Plan or Barriers: Pt is to return to stay with girlfriend and is to follow up with [facility] in [place].   Reason for Continuation of Hospitalization: Medication stabilization  Estimated Length of Stay: 1-3 days   Scribe for Treatment Team: Vassie Moselle, LCSW 09/02/2021 2:36 PM

## 2021-09-02 NOTE — Progress Notes (Signed)
   09/02/21 0104  Psych Admission Type (Psych Patients Only)  Admission Status Involuntary  Psychosocial Assessment  Patient Complaints Anxiety  Eye Contact Fair  Facial Expression Anxious  Affect Appropriate to circumstance  Speech Logical/coherent  Interaction Assertive  Motor Activity Other (Comment) (WDL)  Appearance/Hygiene Unremarkable  Behavior Characteristics Appropriate to situation  Mood Anxious;Pleasant  Thought Process  Coherency Concrete thinking  Content WDL  Delusions None reported or observed  Perception WDL  Hallucination None reported or observed  Judgment Poor  Confusion None  Danger to Self  Current suicidal ideation? Denies  Self-Injurious Behavior No self-injurious ideation or behavior indicators observed or expressed   Danger to Others  Danger to Others None reported or observed

## 2021-09-02 NOTE — Progress Notes (Signed)
[facility] MD Progress Note  09/02/2021 4:07 PM AKI ABALOS  MRN:  [id number] Subjective:   Mr. Depree is a 60 yr old male with a PPHx significant for MDD and EtOH use disorder, he was admitted under IVC for a suicide attempt via overdose on Trazodone.  Psychiatric team made the following recommendations yesterday: -Continue Effexor XR 75 mg. -Continue Remeron 15 mg QHS -Increase Buspar to 10 mg to BID  Case was discussed in the multidisciplinary team. MAR was reviewed and patient was compliant with medications.  He required PRN albuterol, Ativan, and Trazodone.    On interview today patient reports that he is doing okay.  He reports that his sleep is fair.  He reports that his appetite continues to improve and is good.  He reports no SI, HI, or AVH.  He reports that his anxiety is improving.  He reports that over the weekend he was able to attend some of the 5-6 person groups.  He reports that he feels like he has made significant progress while being hospitalized.  He reports that he hopes to continue making forward progress once he is discharged.  When asked what had him so upset yesterday he reports that his fiance had been walking his dog and she had been accosted by some unsavory wintering in the neighborhood.  He reports that he was scared for her and blaming himself for being in the hospital and unable to contact her.  He states that he was able to get a hold of his sister and she got a few ladies from church today over and stayed with her yesterday.  He says that he now understands that he cannot blame himself for what happened yesterday and has been able to put it behind him.  Discussed that his blood pressure has still been a little elevated.  Discussed wanting to increase his lisinopril today and that he should follow-up with his PCP after discharge.  He was agreeable to this and reported understanding.  He reports no other concerns at present.    Principal Problem: MDD  (major depressive disorder), recurrent severe, without psychosis ([place]) Diagnosis: Principal Problem:   MDD (major depressive disorder), recurrent severe, without psychosis ([place])  Total Time spent with patient: 30 minutes  Past Psychiatric History: MDD and EtOH use disorder   Past Medical History:  Past Medical History:  Diagnosis Date   Alcohol withdrawal ([place]) 07/29/2013   Allergy    Anxiety    Arthritis    Benzodiazepine abuse ([place]) 08/30/2019   Bipolar disorder ([place])    CAD (coronary artery disease)    Chronic insomnia 06/06/2015   Chronic migraine without aura, with intractable migraine, so stated, with status migrainosus 08/25/2019   Claustrophobia    Cocaine abuse ([place]) 05/22/2015   COPD (chronic obstructive pulmonary disease) ([place])    Delirium tremens ([place]) 07/29/2013   Depression    Epilepsy ([place])    last seizure week of 06-25-2020- sev. grand mal seizures per pt    Hemiplegic migraine with status migrainosus 06/06/2015   Hypertension    Migraine    Opioid use disorder 08/30/2019   PONV (postoperative nausea and vomiting)    Stroke [facility])    patient denies- states had a hemiplegic migraine in ~2011   Substance abuse ([place])     Past Surgical History:  Procedure Laterality Date   ANKLE SURGERY     in high school   APPENDECTOMY     COLONOSCOPY     KNEE ARTHROSCOPY  Right    NOSE SURGERY     Family History:  Family History  Problem Relation Age of Onset   Heart disease Mother    Stroke Mother    Epilepsy Mother    Heart failure Mother    Cancer Father    Heart disease Father    Hypertension Father    Melanoma Father    Migraines Father    Colon cancer Sister 79   Colon cancer Cousin    Cancer Cousin    Colon polyps Neg Hx    Esophageal cancer Neg Hx    Rectal cancer Neg Hx    Stomach cancer Neg Hx    Family Psychiatric  History: None Social History:  Social History   Substance and Sexual Activity  Alcohol Use Not Currently   Alcohol/week: 6.0  standard drinks   Types: 6 Cans of beer per week   Comment: none since Sept 2020     Social History   Substance and Sexual Activity  Drug Use Not Currently   Types: Marijuana   Comment: pt denies    Social History   Socioeconomic History   Marital status: Legally Separated    Spouse name: Not on file   Number of children: 0   Years of education: GED   Highest education level: Not on file  Occupational History   Occupation: disabled  Tobacco Use   Smoking status: Every Day    Years: 15.00    Types: Cigarettes   Smokeless tobacco: Never   Tobacco comments:    Reports 2 cigarettes daily  Vaping Use   Vaping Use: Never used  Substance and Sexual Activity   Alcohol use: Not Currently    Alcohol/week: 6.0 standard drinks    Types: 6 Cans of beer per week    Comment: none since Sept 2020   Drug use: Not Currently    Types: Marijuana    Comment: pt denies   Sexual activity: Yes    Birth control/protection: Condom  Other Topics Concern   Not on file  Social History Narrative   Pt lives with sister.   Patient drinks 2 cups of caffeine daily.   Patient is right handed.   Social Determinants of Health   Financial Resource Strain: Not on file  Food Insecurity: Not on file  Transportation Needs: Not on file  Physical Activity: Not on file  Stress: Not on file  Social Connections: Not on file   Sleep: Fair  Appetite:  Good  Current Medications: Current Facility-Administered Medications  Medication Dose Route Frequency Provider Last Rate Last Admin   acetaminophen (TYLENOL) tablet 650 mg  650 mg Oral Q6H PRN Ethelene Hal, NP   650 mg at 09/01/21 1827   albuterol (VENTOLIN HFA) 108 (90 Base) MCG/ACT inhaler 1-2 puff  1-2 puff Inhalation Q6H PRN Briant Cedar, MD   2 puff at 09/02/21 0818   alum & mag hydroxide-simeth (MAALOX/MYLANTA) 200-200-20 MG/5ML suspension 30 mL  30 mL Oral Q4H PRN Ethelene Hal, NP       aspirin EC tablet 81 mg  81 mg  Oral Daily Ethelene Hal, NP   81 mg at 09/02/21 0814   busPIRone (BUSPAR) tablet 10 mg  10 mg Oral BID Armando Reichert, MD   10 mg at 09/02/21 0814   docusate sodium (COLACE) capsule 100 mg  100 mg Oral Daily PRN Armando Reichert, MD   100 mg at 08/31/21 1834   famotidine (PEPCID) tablet 20  mg  20 mg Oral Daily Ethelene Hal, NP   20 mg at 09/02/21 0815   fluticasone furoate-vilanterol (BREO ELLIPTA) 200-25 MCG/INH 1 puff  1 puff Inhalation Daily Ethelene Hal, NP   1 puff at 99/37/16 9678   folic acid (FOLVITE) tablet 1 mg  1 mg Oral Daily Ethelene Hal, NP   1 mg at 09/02/21 0815   ibuprofen (ADVIL) tablet 600 mg  600 mg Oral TID PRN Janine Limbo, MD   600 mg at 08/31/21 2112   levETIRAcetam (KEPPRA) tablet 1,000 mg  1,000 mg Oral BID Ethelene Hal, NP   1,000 mg at 09/02/21 0814   [START ON 09/03/2021] lisinopril (ZESTRIL) tablet 10 mg  10 mg Oral Daily Briant Cedar, MD       LORazepam (ATIVAN) tablet 0.5 mg  0.5 mg Oral Daily PRN Briant Cedar, MD   0.5 mg at 09/01/21 1329   magnesium hydroxide (MILK OF MAGNESIA) suspension 5 mL  5 mL Oral Daily PRN Harlow Asa, MD       mirtazapine (REMERON) tablet 15 mg  15 mg Oral QHS Briant Cedar, MD   15 mg at 09/01/21 2112   multivitamin with minerals tablet 1 tablet  1 tablet Oral Daily Ethelene Hal, NP   1 tablet at 09/02/21 9381   nicotine (NICODERM CQ - dosed in mg/24 hr) patch 7 mg  7 mg Transdermal Daily Nelda Marseille, Amy E, MD   7 mg at 09/01/21 0808   pantoprazole (PROTONIX) EC tablet 40 mg  40 mg Oral Daily Ethelene Hal, NP   40 mg at 09/02/21 0815   polyethylene glycol (MIRALAX / GLYCOLAX) packet 17 g  17 g Oral Daily PRN Armando Reichert, MD       rosuvastatin (CRESTOR) tablet 10 mg  10 mg Oral Daily Ethelene Hal, NP   10 mg at 09/02/21 0815   sucralfate (CARAFATE) tablet 1 g  1 g Oral TID WC & HS Ethelene Hal, NP   1 g at 09/02/21 1207    thiamine tablet 100 mg  100 mg Oral Daily Lindell Spar I, NP   100 mg at 09/02/21 0814   traZODone (DESYREL) tablet 50 mg  50 mg Oral QHS PRN Ethelene Hal, NP   50 mg at 09/01/21 2333   venlafaxine XR (EFFEXOR-XR) 24 hr capsule 75 mg  75 mg Oral Q breakfast Briant Cedar, MD   75 mg at 09/02/21 0175    Lab Results:  Results for orders placed or performed during the hospital encounter of 08/27/21 (from the past 48 hour(s))  Basic metabolic panel     Status: Abnormal   Collection Time: 09/02/21  6:20 AM  Result Value Ref Range   Sodium 139 135 - 145 mmol/L   Potassium 3.1 (L) 3.5 - 5.1 mmol/L   Chloride 103 98 - 111 mmol/L   CO2 31 22 - 32 mmol/L   Glucose, Bld 92 70 - 99 mg/dL    Comment: Glucose reference range applies only to samples taken after fasting for at least 8 hours.   BUN 8 8 - 23 mg/dL   Creatinine, Ser 0.84 0.61 - 1.24 mg/dL   Calcium 9.3 8.9 - 10.3 mg/dL   GFR, Estimated >60 >60 mL/min    Comment: (NOTE) Calculated using the CKD-EPI Creatinine Equation (2021)    Anion gap 5 5 - 15    Comment: Performed at [facility],  [place] [address] [place], [place] [address]    Blood Alcohol level:  Lab Results  Component Value Date   [facility] <10 08/26/2021   ETH <10 09/73/5329    Metabolic Disorder Labs: Lab Results  Component Value Date   HGBA1C 4.8 08/31/2021   MPG 91.06 08/31/2021   MPG 93.93 02/01/2018   Lab Results  Component Value Date   PROLACTIN 15.2 02/01/2018   Lab Results  Component Value Date   CHOL 142 08/28/2021   TRIG 91 08/28/2021   HDL 66 08/28/2021   CHOLHDL 2.2 08/28/2021   VLDL 18 08/28/2021   LDLCALC 58 08/28/2021   LDLCALC 50 07/13/2020    Physical Findings: CIWA:  CIWA-Ar Total: 1   Musculoskeletal: Strength & Muscle Tone: within normal limits Gait & Station: normal Patient leans: N/A  Psychiatric Specialty Exam:  Presentation  General Appearance: Appropriate for Environment; Casual  Eye  Contact:Good  Speech:Clear and Coherent; Normal Rate  Speech Volume:Normal  Handedness:Right   Mood and Affect  Mood: initially irritable but as interview progressed was calmer and more euthymic  Affect:Initially irritable but became calmer, more polite, and brighter appearing by end of interview   Thought Process  Thought Processes:Coherent , goal directed  Descriptions of Associations:Intact  Orientation:Full (Time, Place and Person)  Thought Content: Logical - denies AVH,paranoia, or delusions  History of Schizophrenia/Schizoaffective disorder:No  Duration of Psychotic Symptoms:No data recorded Hallucinations:Hallucinations: None  Ideas of Reference:None  Suicidal Thoughts:Suicidal Thoughts: No  Homicidal Thoughts:Homicidal Thoughts: No   Sensorium  Memory:Immediate Good; Recent Good  Judgment:Fair  Insight:Fair   Executive Functions  Concentration:Good  Attention Span:Good  [place] of Knowledge:Good  Language:Good   Psychomotor Activity  Psychomotor Activity:Psychomotor Activity: Normal   Assets  Assets:Communication Skills; Desire for Improvement; Resilience   Sleep  Sleep:Sleep: Poor Number of Hours of Sleep: 3.75  Physical Exam Vitals and nursing note reviewed.  Constitutional:      General: He is not in acute distress.    Appearance: Normal appearance. He is normal weight. He is not ill-appearing or toxic-appearing.  HENT:     Head: Normocephalic and atraumatic.  Pulmonary:     Effort: Pulmonary effort is normal.  Musculoskeletal:        General: Normal range of motion.  Neurological:     General: No focal deficit present.     Mental Status: He is alert.   Review of Systems  Respiratory:  Negative for cough and shortness of breath.   Cardiovascular:  Negative for chest pain.  Gastrointestinal:  Negative for abdominal pain, constipation, diarrhea, nausea and vomiting.  Neurological:  Negative for weakness and  headaches.  Psychiatric/Behavioral:  Negative for depression, hallucinations and suicidal ideas. The patient is nervous/anxious (improving).   Blood pressure 119/68, pulse 60, temperature 98.6 F (37 C), temperature source Oral, resp. rate 18, height 6' (1.829 m), weight 68.9 kg, SpO2 99 %. Body mass index is 20.61 kg/m.   Treatment Plan Summary: Daily contact with patient to assess and evaluate symptoms and progress in treatment  Mr. Jansson is a 60 yr old male with a PPHx significant for MDD and EtOH use disorder, he was admitted under IVC for a suicide attempt via overdose on Trazodone.   He is significantly improved since his admission.  As his blood pressure continues to be elevated will increase his Lisinopril.  We will not make any psychiatric medication changes today.  We will plan for discharge tomorrow.    MDD, Recurrent, Severe w/out  psychosis  GAD  PTSD: -Continue Effexor XR 75 mg (monitoring BP on med) -Continue Remeron 15 mg QHS -Continue Buspar  10 mg BID    Epilepsy: -Continue Keppra 1000 mg BID    Alcohol Use d/o: -Continue CIWA protocol-most recent score 6 -Continue Imodium 2-4 mg  -Continue Zofran-ODT 4 mg q6 PRN -Continue Thiamine 100 mg daily -Continue Folic Acid 1 mg daily -Continue Multivitamin daily    Constipation -Continue Colace 100 mg daily as needed for constipation -Continue milk of magnesia as needed -Continue MiraLAX 17 gm daily as needed -Encourage fluid intake   Hypertension -Increase Lisinopril from 5 mg to 10 mg daily -Monitor vitals every 6 hours   COPD: -Continue Breo Ellipta 1 puff daily -Continue Albuterol 1-2 puff q6 PRN     Nicotine Dependence: -Continue Nicotine Patch 21 mg daily     -Continue Aspirin 81 mg daily -Continue Pepcid 20 mg daily -Continue Rosuvastatin 10 mg daily -Continue Carafate 1 g TID with meals -Continue Advil 600 mg TID PRN for headache, migraine. -Continue PRN's: Tylenol, Maalox, Atarax,   Trazodone   Briant Cedar, MD PGY 2 09/02/2021, 4:07 PM

## 2021-09-02 NOTE — Group Note (Signed)
Recreation Therapy Group Note   Group Topic:Stress Management  Group Date: 09/02/2021 Start Time: 0935 End Time: 0950 Facilitators: Victorino Sparrow, LRT/CTRS Location: [address] Dayroom  Goal Area(s) Addresses:  Patient will actively participate in stress management techniques presented during session.  Patient will successfully identify benefit of practicing stress management post d/c.   Group Description: Guided Imagery. LRT provided education, instruction, and demonstration on practice of visualization via guided imagery. Patient was asked to participate in the technique introduced during session. LRT debriefed including topics of mindfulness, stress management and specific scenarios each patient could use these techniques. Patients were given suggestions of ways to access scripts post d/c and encouraged to explore Youtube and other apps available on smartphones, tablets, and computers.   Affect/Mood: N/A   Participation Level: Did not attend    Clinical Observations/Individualized Feedback: Pt did not attend group.    Plan: Continue to engage patient in RT group sessions 2-3x/week.   Victorino Sparrow, LRT/CTRS 09/02/2021 12:40 PM

## 2021-09-02 NOTE — Group Note (Signed)
LCSW Group Therapy Note   Group Date: 09/02/2021 Start Time: 1300 End Time: 1400   Type of Therapy and Topic:  Group Therapy:   Participation Level:  Did Not Attend  Patients received a worksheet with an outline of 2 gingerbread men with a separation in the middle of the page. One sign designated what the pt sees about themselves and the other is what others see. Pts were asked to introduce themselves and share something they like about themself. Pts were then asked to draw, write or color how they view themselves as well as how they are viewed by others. CSW led discussion about the feelings and words associated with each side.   Patient Summary:   Did not attend  Darleen Crocker, LCSWA 09/02/2021  1:48 PM

## 2021-09-02 NOTE — Group Note (Signed)
Occupational Therapy Group Note  Group Topic:Communication  Group Date: 09/02/2021 Start Time: 1400 End Time: 1445 Facilitators: Ponciano Ort, OT/L   Group Description: Group encouraged increased engagement and participation through discussion focused on communication styles. Patients were educated on the different styles of communication including passive, aggressive, assertive, and passive-aggressive communication. Group members shared and reflected on which styles they most often find themselves communicating in and brainstormed strategies on how to transition and practice a more assertive approach. Further discussion explored how to use assertiveness skills and strategies to further advocate and ask questions as it relates to their treatment plan and mental health.   Therapeutic Goal(s): Identify practical strategies to improve communication skills  Identify how to use assertive communication skills to address individual needs and wants   Participation Level: OT Group not held d/t high volume of individual OT/PT orders that needed to be seen.    Plan: Continue to engage patient in OT groups 2 - 3x/week.  09/02/2021  Ponciano Ort, OT/L

## 2021-09-02 NOTE — Progress Notes (Addendum)
   09/02/21 1400  Psych Admission Type (Psych Patients Only)  Admission Status Involuntary  Psychosocial Assessment  Patient Complaints Anxiety  Eye Contact Fair  Facial Expression Anxious  Affect Appropriate to circumstance  Speech Logical/coherent  Interaction Assertive  Motor Activity Other (Comment) (WDL)  Appearance/Hygiene Unremarkable  Behavior Characteristics Cooperative;Appropriate to situation  Mood Anxious;Pleasant  Thought Process  Coherency Concrete thinking  Content WDL  Delusions None reported or observed  Perception WDL  Hallucination None reported or observed  Judgment Poor  Confusion None  Danger to Self  Current suicidal ideation? Denies  Self-Injurious Behavior No self-injurious ideation or behavior indicators observed or expressed   Danger to Others  Danger to Others None reported or observed   D. Pt presents less anxious today- reported that he slept well, and is ready for discharge soon. Pt did not attend groups today due to pt having a headache.Pt currently denies SI/HI and AVH A. Labs and vitals monitored. Pt given and educated on medications.Pt provided with heat pack for headache- refused prn med as he wanted to rest with heat pack. Pt supported emotionally and encouraged to express concerns and ask questions.   R. Pt remains safe with 15 minute checks. Will continue POC.

## 2021-09-02 NOTE — BHH Group Notes (Signed)
Pt was able to attend AA/NA group. Pt was appropriate during tonight's group discussion wrap up group.

## 2021-09-02 NOTE — Progress Notes (Signed)
DDominica Severin attended evening AA group.  He denied SI/HI or AVH.  He reported his day as 8/10 (10 the best).  Denied any depression.  His main concern is why his blood pressure has been elevated.  His blood pressure was rechecked and was lower than the last pressure.  He reported looking forward to being discharged tomorrow. A:  1:1 with RN for support and encouragement.  Medications given as ordered.  Q 15 minute checks maintained for safety.  Encouraged participation in group and unit activities.   R:  He is currently resting with his eyes closed and appears to be asleep.  He remains safe on the unit.  We will continue to monitor the progress towards his goals.   09/02/21 2159  Psych Admission Type (Psych Patients Only)  Admission Status Involuntary  Psychosocial Assessment  Patient Complaints Anxiety;Worrying  Eye Contact Fair  Facial Expression Anxious  Affect Appropriate to circumstance  Speech Logical/coherent  Interaction Assertive  Motor Activity Other (Comment) (unremarkable)  Appearance/Hygiene Unremarkable  Behavior Characteristics Cooperative;Appropriate to situation  Mood Anxious;Pleasant  Thought Process  Coherency WDL  Content WDL  Delusions None reported or observed  Perception WDL  Hallucination None reported or observed  Judgment Poor  Confusion None  Danger to Self  Current suicidal ideation? Denies  Danger to Others  Danger to Others None reported or observed

## 2021-09-02 NOTE — BHH Group Notes (Signed)
Adult Psychoeducational Group Note  Date:  09/02/2021 Time:  4:55 PM  Group Topic/Focus:  Healthy Communication:   The focus of this group is to discuss communication, barriers to communication, as well as healthy ways to communicate with others.  Participation Level:  Active  Participation Quality:  Appropriate  Affect:  Appropriate  Cognitive:  Appropriate  Insight: Appropriate  Engagement in Group:  Engaged  Modes of Intervention:  Discussion  Additional Comments:  Patient attended and participated in the Psycho-Ed group.  Annie Sable 09/02/2021, 4:55 PM

## 2021-09-03 MED ORDER — MIRTAZAPINE 15 MG PO TABS: 15.0000 mg | ORAL_TABLET | Freq: Every day | ORAL | 0 refills | Status: DC

## 2021-09-03 MED ORDER — NICOTINE 7 MG/24HR TD PT24: 7.0000 mg | MEDICATED_PATCH | Freq: Every day | TRANSDERMAL | 0 refills | Status: AC

## 2021-09-03 MED ORDER — VENLAFAXINE HCL ER 75 MG PO CP24: 75.0000 mg | ORAL_CAPSULE | Freq: Every day | ORAL | 0 refills | Status: DC

## 2021-09-03 MED ORDER — POTASSIUM CHLORIDE CRYS ER 20 MEQ PO TBCR
40.0000 meq | EXTENDED_RELEASE_TABLET | Freq: Once | ORAL | Status: AC
  Administered 2021-09-03: 40 meq via ORAL
  Filled 2021-09-03 (×2): qty 2

## 2021-09-03 MED ORDER — LISINOPRIL 10 MG PO TABS: 10.0000 mg | ORAL_TABLET | Freq: Every day | ORAL | 0 refills | Status: DC

## 2021-09-03 MED ORDER — BUSPIRONE HCL 10 MG PO TABS: 10.0000 mg | ORAL_TABLET | Freq: Two times a day (BID) | ORAL | 0 refills | Status: DC

## 2021-09-03 MED ORDER — SUCRALFATE 1 G PO TABS: 1.0000 g | ORAL_TABLET | Freq: Three times a day (TID) | ORAL | 0 refills | Status: DC

## 2021-09-03 NOTE — Care Management Important Message (Signed)
Medicare IM printed for social work to be given to the patient.

## 2021-09-03 NOTE — Progress Notes (Signed)
D:  Patient's self inventory sheet, patient has fair sleep, sleeps good.  Good appetite, normal energy level, good concentration.  Rated depression 3, hopeless 2, anxiety 5.  Denied withdrawals.  Denied SI.  Denied physical problems.  Denied physical pain.  Goal is discharge.  Plan to discharge home. A:  Medications administered per MD orders.  Emotional support and encouragement given patient. R:  Patient denied SI and HI, contracts for safety.  Denied A/V hallucinations.  Denied pain.  Safety maintained with 15 minute checks.

## 2021-09-03 NOTE — Progress Notes (Signed)
Discharge Note:  Patient discharged home via girlfriend. Suicide prevention information given and discussed with patient who stated he understood and had no questions.  Patient denied SI and HI.  Denied A/V hallucinations.  Patient stated he received all his belongings, clothing, toiletries, misc items, etc.  Patient stated he appreciated all assistance received from [facility] staff.  All required discharge information given.

## 2021-09-03 NOTE — B[facility] Group Notes (Signed)
The focus of this group is to help patients establish daily goals to achieve during treatment and discuss how the patient can incorporate goal setting into their daily lives to aide in recovery.  Pt did not attend morning goals group.

## 2021-09-03 NOTE — BHH Suicide Risk Assessment (Signed)
[facility] Discharge Suicide Risk Assessment   Principal Problem: MDD (major depressive disorder), recurrent severe, without psychosis ([place]) Discharge Diagnoses: Principal Problem:   MDD (major depressive disorder), recurrent severe, without psychosis ([place])   Total Time spent with patient: 30 minutes  Spencer Mendoza is a 60 yr old male with a PPHx significant for MDD and EtOH use disorder he was admitted under IVC for a suicide attempt via overdose on Trazodone.  During the patient's hospitalization, patient had extensive initial psychiatric evaluation, and follow-up psychiatric evaluations every day. Patient's psychiatric medications were adjusted on admission: started effexor, started remeron, and continued Buspar.  During the hospitalization, other adjustments were made to the patient's psychiatric medication regimen: titration of effexor and remeron. We also started and increased lisinopril for HTN.   Patient's care was discussed during the interdisciplinary team meeting every day during the hospitalization. The patient denies having side effects to prescribed psychiatric medication. The patient reports their target psychiatric symptoms of depression, anxiety, poor sleep, suicidal thoughts, all responded well to the psychiatric medications, and the patient reports overall benefit other psychiatric hospitalization.   Labs were reviewed with the patient, and abnormal results were discussed with the patient. The patient denied having suicidal thoughts more than 48 hours prior to discharge.  Patient denies having homicidal thoughts.  Patient denies having auditory hallucinations.  Patient denies any visual hallucinations.  Patient denies having paranoid thoughts. The patient is able to verbalize their individual safety plan to this provider. It is recommended to the patient to continue psychiatric medications as prescribed, after discharge from the hospital.   It is recommended to the patient to  follow up with your outpatient psychiatric provider and PCP. Discussed with the patient, the impact of alcohol, drugs, tobacco have been there overall psychiatric and medical wellbeing, and abstaining from substance use was recommended the patient.   Musculoskeletal: Strength & Muscle Tone: within normal limits Gait & Station: normal Patient leans: N/A  Psychiatric Specialty Exam  Presentation  General Appearance: Appropriate for Environment; Casual; Fairly Groomed  Eye Contact:Good  Speech:Clear and Coherent; Normal Rate  Speech Volume:Normal  Handedness:Right   Mood and Affect  Mood:Euthymic  Duration of Depression Symptoms: No data recorded Affect:Appropriate; Congruent; Full Range   Thought Process  Thought Processes:Coherent; Linear  Descriptions of Associations:Circumstantial  Orientation:Full (Time, Place and Person)  Thought Content:Logical  History of Schizophrenia/Schizoaffective disorder:No  Duration of Psychotic Symptoms:No data recorded Hallucinations:Hallucinations: None  Ideas of Reference:None  Suicidal Thoughts:Suicidal Thoughts: No  Homicidal Thoughts:Homicidal Thoughts: No   Sensorium  Memory:Immediate Good; Recent Good; Remote Good  Judgment:Good  Insight:Good   Executive Functions  Concentration:Good  Attention Span:Good  [place] of Knowledge:Good  Language:Good   Psychomotor Activity  Psychomotor Activity:Psychomotor Activity: Normal   Assets  Assets:Communication Skills; Desire for Improvement; Resilience   Sleep  Sleep:Sleep: Good Number of Hours of Sleep: 3.75   Physical Exam: Physical Exam see discharge summary ROS see discharge summary   Blood pressure (!) 135/94, pulse 66, temperature 97.8 F (36.6 C), resp. rate 18, height 6' (1.829 m), weight 68.9 kg, SpO2 100 %. Body mass index is 20.61 kg/m.  Mental Status Per Nursing Assessment::   On Admission:  Suicidal ideation indicated by  patient, Plan includes specific time, place, or method, Intention to act on suicide plan, Self-harm thoughts, Belief that plan would result in death, Suicide plan  Demographic Factors:  Male, Divorced or widowed, and Living alone  Loss Factors: NA  Historical Factors: Impulsivity  Risk  Reduction Factors:   Sense of responsibility to family, Positive social support, Positive therapeutic relationship, and Positive coping skills or problem solving skills  Continued Clinical Symptoms:  Severe Anxiety and/or Agitation Depression:   Impulsivity  Cognitive Features That Contribute To Risk:  None    Suicide Risk:  Mild:  There are no identifiable suicide plans, no associated intent, mild dysphoria and related symptoms, good self-control (both objective and subjective assessment), few other risk factors, and identifiable protective factors, including available and accessible social support.   Follow-up [facility] Follow up on 09/04/2021.   Specialty: Behavioral Health Why: You have an appointment for medication management services on 09/04/21 at 2:30 pm, Virtual.  You also have an appointment for therapy with Josh on 09/11/21 at 2:00 pm.   Both appointments will be Virtual via [place]. Contact information: [place] [phone]                Plan Of Care/Follow-up recommendations:   Activity: as tolerated  Diet: heart healthy  Other: -Follow-up with your outpatient psychiatric provider -instructions on appointment date, time, and address (location) are provided to you in discharge paperwork.  -Take your psychiatric medications as prescribed at discharge - instructions are provided to you in the discharge paperwork  -Follow-up with outpatient primary care doctor and other specialists -for management of chronic medical disease, including: hypertension, seizure  disorder, migraines  -Testing: Follow-up with outpatient provider for abnormal lab results: low potassium, replaced before discharge - please follow-up with a BMP in 3-5 days and with your PCP  -Recommend cessation of alcohol, tobacco, and other illicit drug use at discharge.   -If your psychiatric symptoms recur, worsening, or if you have side effects to your psychiatric medications, call your outpatient psychiatric provider, 911, [phone] or go to the nearest emergency department. -If suicidal thoughts recur, call your outpatient psychiatric provider, 911, [phone] or go to the nearest emergency department.    Christoper Allegra, MD 09/03/2021, 10:08 AM

## 2021-09-03 NOTE — Progress Notes (Signed)
  [facility] Adult Case Management Discharge Plan :  Will you be returning to the same living situation after discharge:  Yes,  Home At discharge, do you have transportation home?: Yes,  Girlfriend  Do you have the ability to pay for your medications: Yes,  Medicare   Release of information consent forms completed and in the chart;  Patient's signature needed at discharge.  Patient to Follow up at:  Follow-up [facility] Follow up on 09/04/2021.   Specialty: Behavioral Health Why: You have an appointment for medication management services on 09/04/21 at 2:30 pm, Virtual.  You also have an appointment for therapy with Josh on 09/11/21 at 2:00 pm.   Both appointments will be Virtual via [place]. Contact information: [place]  [address] [place] [phone]                Next level of care provider has access to [facility] and Suicide Prevention discussed: Yes,  with patient and girlfriend      Has patient been referred to the Quitline?: Patient refused referral  Patient has been referred for addiction treatment: N/A  Darleen Crocker, [place] 09/03/2021, 10:04 AM

## 2021-09-03 NOTE — Discharge Summary (Signed)
Physician Discharge Summary Note  Patient:  Spencer Mendoza is an 60 y.o., male MRN:  [id number] DOB:  Jan 03, 1961 Patient phone:  [phone] (home)  Patient address:   [address] [place] [address],  Total Time spent with patient: 30 minutes  Date of Admission:  08/27/2021 Date of Discharge: 09/03/2021  Reason for Admission:   [name] is a 60 yr old male with a PPHx significant for MDD and EtOH use disorder, he was admitted under IVC for a suicide attempt via overdose on Trazodone.  His main stressor has been his medical conditions.  He has had migraines throughout his life and also has had a seizure disorder since he was very young.  He reported that while in school he used to be taunted by classmates who would pretend to have seizures in front of him.  He reported that he has had thoughts of self-harm since then.  He reported that the pain and intractable vomiting from his migraines have also worsened his anxiety and panic attacks.    Hospital Course:   During hospitalization, patient was evaluated by the psychiatric team.  They received multiple disciplinary care.  After initial intake evaluation, it was decided to adjust medications.  He was started on the Effexor and Remeron.  These were titrated during his stay.  He had a good response to them and reported a decrease in his depression and anxiety. During his hospitalization he was noted to have increased blood pressure.  He was started on lisinopril.  He responded well to the medication but was still running an elevated blood pressure.  He will follow-up with his PCP for this. Patient not having side effects to psychiatric medications.   Patient received supportive psychotherapy and was encouraged to participate in group therapy during the hospitalization. Patient denied having suicidal thoughts more than 48 hours prior to discharge.    On day of discharge patient reports that he is feeling okay.  He reports  that his sleep was good last night.  He reports that his appetite has been good.  He reports no SI, HI, or AVH.  He reports that he was a little anxious last night and this morning about discharge.  He reports his anxiety around a 4 out of 10.  He states that it is not debilitating and he has been able to use his coping skills to help work through it.  Discussed with him his follow-up med management and therapy appointments have been arranged by social work.  Discussed with him that his blood pressure was still a little high today despite the 10 mg of lisinopril.  Discussed with him the need to follow-up with his PCP within the next week for further management and he was agreeable to this.  His lab work yesterday revealed mild hypokalemia so he was given a dose of 40 mEq potassium replacement.  Discussed with him that at his PCP follow-up appointment he will also need recheck of his blood work to ensure his potassium has normalized.  He was agreeable to this and had no concerns.  Discussed with him that if she should have a crisis again he could return to [facility], go to [facility], go to the nearest ED, or call 911 or [phone].  He reported understanding and had no questions about this.  He reported no side effects to his medications.  He was discharged home.   Principal Problem: MDD (major depressive disorder), recurrent severe, without psychosis ([place]) Discharge Diagnoses: Principal Problem:  MDD (major depressive disorder), recurrent severe, without psychosis ([place])   Past Psychiatric History: MDD and EtOH use disorder   Past Medical History:  Past Medical History:  Diagnosis Date   Alcohol withdrawal ([place]) 07/29/2013   Allergy    Anxiety    Arthritis    Benzodiazepine abuse ([place]) 08/30/2019   Bipolar disorder ([place])    CAD (coronary artery disease)    Chronic insomnia 06/06/2015   Chronic migraine without aura, with intractable migraine, so stated, with status migrainosus 08/25/2019    Claustrophobia    Cocaine abuse ([place]) 05/22/2015   COPD (chronic obstructive pulmonary disease) ([place])    Delirium tremens ([place]) 07/29/2013   Depression    Epilepsy ([place])    last seizure week of 06-25-2020- sev. grand mal seizures per pt    Hemiplegic migraine with status migrainosus 06/06/2015   Hypertension    Migraine    Opioid use disorder 08/30/2019   PONV (postoperative nausea and vomiting)    Stroke [facility])    patient denies- states had a hemiplegic migraine in ~2011   Substance abuse ([place])     Past Surgical History:  Procedure Laterality Date   ANKLE SURGERY     in high school   APPENDECTOMY     COLONOSCOPY     KNEE ARTHROSCOPY Right    NOSE SURGERY     Family History:  Family History  Problem Relation Age of Onset   Heart disease Mother    Stroke Mother    Epilepsy Mother    Heart failure Mother    Cancer Father    Heart disease Father    Hypertension Father    Melanoma Father    Migraines Father    Colon cancer Sister 53   Colon cancer Cousin    Cancer Cousin    Colon polyps Neg Hx    Esophageal cancer Neg Hx    Rectal cancer Neg Hx    Stomach cancer Neg Hx    Family Psychiatric  History: None Social History:  Social History   Substance and Sexual Activity  Alcohol Use Not Currently   Alcohol/week: 6.0 standard drinks   Types: 6 Cans of beer per week   Comment: none since Sept 2020     Social History   Substance and Sexual Activity  Drug Use Not Currently   Types: Marijuana   Comment: pt denies    Social History   Socioeconomic History   Marital status: Legally Separated    Spouse name: Not on file   Number of children: 0   Years of education: GED   Highest education level: Not on file  Occupational History   Occupation: disabled  Tobacco Use   Smoking status: Every Day    Years: 15.00    Types: Cigarettes   Smokeless tobacco: Never   Tobacco comments:    Reports 2 cigarettes daily  Vaping Use   Vaping Use: Never used  Substance  and Sexual Activity   Alcohol use: Not Currently    Alcohol/week: 6.0 standard drinks    Types: 6 Cans of beer per week    Comment: none since Sept 2020   Drug use: Not Currently    Types: Marijuana    Comment: pt denies   Sexual activity: Yes    Birth control/protection: Condom  Other Topics Concern   Not on file  Social History Narrative   Pt lives with sister.   Patient drinks 2 cups of caffeine daily.   Patient is  right handed.   Social Determinants of Health   Financial Resource Strain: Not on file  Food Insecurity: Not on file  Transportation Needs: Not on file  Physical Activity: Not on file  Stress: Not on file  Social Connections: Not on file    Physical Findings:   Musculoskeletal: Strength & Muscle Tone: within normal limits Gait & Station: normal Patient leans: N/A   Psychiatric Specialty Exam:  Presentation  General Appearance: Appropriate for Environment; Casual; Fairly Groomed  Eye Contact:Good  Speech:Clear and Coherent; Normal Rate  Speech Volume:Normal  Handedness:Right   Mood and Affect  Mood:Euthymic  Affect:Appropriate; Congruent; Full Range   Thought Process  Thought Processes:Coherent; Linear  Descriptions of Associations:Circumstantial  Orientation:Full (Time, Place and Person)  Thought Content:Logical  History of Schizophrenia/Schizoaffective disorder:No  Duration of Psychotic Symptoms:No data recorded Hallucinations:Hallucinations: None  Ideas of Reference:None  Suicidal Thoughts:Suicidal Thoughts: No  Homicidal Thoughts:Homicidal Thoughts: No   Sensorium  Memory:Immediate Good; Recent Good; Remote Good  Judgment:Good  Insight:Good   Executive Functions  Concentration:Good  Attention Span:Good  [place] of Knowledge:Good  Language:Good   Psychomotor Activity  Psychomotor Activity:Psychomotor Activity: Normal   Assets  Assets:Communication Skills; Desire for Improvement;  Resilience   Sleep  Sleep:Sleep: Good Number of Hours of Sleep: 3.75    Physical Exam: Physical Exam Vitals and nursing note reviewed.  Constitutional:      General: He is not in acute distress.    Appearance: Normal appearance. He is normal weight. He is not ill-appearing or toxic-appearing.  HENT:     Head: Normocephalic and atraumatic.  Pulmonary:     Effort: Pulmonary effort is normal.  Musculoskeletal:        General: Normal range of motion.  Neurological:     General: No focal deficit present.     Mental Status: He is alert.   Review of Systems  Respiratory:  Negative for cough and shortness of breath.   Cardiovascular:  Negative for chest pain.  Gastrointestinal:  Negative for abdominal pain, constipation, diarrhea, nausea and vomiting.  Neurological:  Negative for weakness and headaches.  Psychiatric/Behavioral:  Negative for depression, hallucinations and suicidal ideas. The patient is nervous/anxious (improved).   Blood pressure (!) 135/94, pulse 66, temperature 97.8 F (36.6 C), resp. rate 18, height 6' (1.829 m), weight 68.9 kg, SpO2 100 %. Body mass index is 20.61 kg/m.   Social History   Tobacco Use  Smoking Status Every Day   Years: 15.00   Types: Cigarettes  Smokeless Tobacco Never  Tobacco Comments   Reports 2 cigarettes daily   Tobacco Cessation:  A prescription for an FDA-approved tobacco cessation medication provided at discharge   Blood Alcohol level:  Lab Results  Component Value Date   [facility] <10 08/26/2021   ETH <10 52/84/1324    Metabolic Disorder Labs:  Lab Results  Component Value Date   HGBA1C 4.8 08/31/2021   MPG 91.06 08/31/2021   MPG 93.93 02/01/2018   Lab Results  Component Value Date   PROLACTIN 15.2 02/01/2018   Lab Results  Component Value Date   CHOL 142 08/28/2021   TRIG 91 08/28/2021   HDL 66 08/28/2021   CHOLHDL 2.2 08/28/2021   VLDL 18 08/28/2021   LDLCALC 58 08/28/2021   LDLCALC 50 07/13/2020    See  Psychiatric Specialty Exam and Suicide Risk Assessment completed by Attending Physician prior to discharge.  Discharge destination:  Home  Is patient on multiple antipsychotic therapies at discharge:  No  Has Patient had three or more failed trials of antipsychotic monotherapy by history:  No  Recommended Plan for Multiple Antipsychotic Therapies: NA  Discharge Instructions     Diet - low sodium heart healthy   Complete by: As directed    Increase activity slowly   Complete by: As directed       Allergies as of 09/03/2021       Reactions   Sertraline Other (See Comments)   Erectile dysfunction   Codeine Nausea And Vomiting   Depakote [divalproex Sodium] Other (See Comments)   Made patient shake   Migranal [dihydroergotamine] Nausea And Vomiting   Topamax [topiramate] Other (See Comments)   Made patient angry        Medication List     STOP taking these medications    ondansetron 4 MG disintegrating tablet Commonly known as: Zofran ODT   traZODone 100 MG tablet Commonly known as: DESYREL       TAKE these medications      Indication  albuterol 108 (90 Base) MCG/ACT inhaler Commonly known as: VENTOLIN HFA INHALE 2 PUFFS BY MOUTH INTO THE LUNGS EVERY 4 HOURS AS NEEDED FOR SHORTNESS OF BREATH OR WHEEZING  Indication: Reversible Disease of Blockage in Breathing Passages   ALPRAZolam 0.25 MG tablet Commonly known as: XANAX Take 0.25 mg by mouth 2 (two) times daily as needed for anxiety.  Indication: Feeling Anxious   aspirin EC 81 MG tablet Take 1 tablet (81 mg total) by mouth daily. Swallow whole.  Indication: Acute Heart Attack   budesonide-formoterol 160-4.5 MCG/ACT inhaler Commonly known as: Symbicort Inhale 2 puffs into the lungs 2 (two) times daily.  Indication: Chronic Obstructive Lung Disease   busPIRone 10 MG tablet Commonly known as: BUSPAR Take 1 tablet (10 mg total) by mouth 2 (two) times daily. What changed:  medication strength See  the new instructions.  Indication: Anxiety Disorder   famotidine 20 MG tablet Commonly known as: PEPCID TAKE 1 TABLET(20 MG) BY MOUTH AT BEDTIME  Indication: Gastroesophageal Reflux Disease   folic acid 1 MG tablet Commonly known as: FOLVITE Take 1 tablet (1 mg total) by mouth daily.  Indication: Anemia From Inadequate Folic Acid   levETIRAcetam 1000 MG tablet Commonly known as: Keppra Take 1 tablet (1,000 mg total) by mouth 2 (two) times daily.  Indication: Seizure   lisinopril 10 MG tablet Commonly known as: ZESTRIL Take 1 tablet (10 mg total) by mouth daily. Start taking on: September 04, 2021  Indication: High Blood Pressure Disorder   mirtazapine 15 MG tablet Commonly known as: REMERON Take 1 tablet (15 mg total) by mouth at bedtime.  Indication: Major Depressive Disorder, Panic Disorder   nicotine 7 mg/24hr patch Commonly known as: NICODERM CQ - dosed in mg/24 hr Place 1 patch (7 mg total) onto the skin daily for 7 days. Start taking on: September 04, 2021  Indication: Nicotine Addiction   pantoprazole 40 MG tablet Commonly known as: PROTONIX TAKE 1 TABLET(40 MG) BY MOUTH DAILY 30 MINUTES BEFORE BREAKFAST  Indication: Gastroesophageal Reflux Disease   rosuvastatin 10 MG tablet Commonly known as: CRESTOR Take 1 tablet (10 mg total) by mouth daily.  Indication: Heart Attack   sucralfate 1 g tablet Commonly known as: Carafate Take 1 tablet (1 g total) by mouth 4 (four) times daily -  with meals and at bedtime.  Indication: Stomach Ulcer   thiamine 100 MG tablet Take 1 tablet (100 mg total) by mouth daily.  Indication: Deficiency of Vitamin  B1   venlafaxine XR 75 MG 24 hr capsule Commonly known as: EFFEXOR-XR Take 1 capsule (75 mg total) by mouth daily with breakfast. Start taking on: September 04, 2021  Indication: Major Depressive Disorder        Follow-up Information     [facility] Follow up on 09/04/2021.    Specialty: Behavioral Health Why: You have an appointment for medication management services on 09/04/21 at 2:30 pm, Virtual.  You also have an appointment for therapy with Josh on 09/11/21 at 2:00 pm.   Both appointments will be Virtual via [place]. Contact information: [place] [phone]                Follow-up recommendations:  - Activity as tolerated. - Diet as recommended by PCP. - Keep all scheduled follow-up appointments as recommended. - Follow up with your PCP for further management of your blood pressure and recheck blood work for hypokalemia  Comments: Patient is instructed to take all prescribed medications as recommended. Report any side effects or adverse reactions to your outpatient psychiatrist. Patient is instructed to abstain from alcohol and illegal drugs while on prescription medications. In the event of worsening symptoms, patient is instructed to call the crisis hotline, 911, or go to the nearest emergency department for evaluation and treatment.   Signed: Briant Cedar, MD 09/03/2021, 1:26 PM

## 2021-09-03 NOTE — Plan of Care (Signed)
Nurse discussed coping skills with patient.  

## 2021-09-04 ENCOUNTER — Other Ambulatory Visit: Payer: Self-pay

## 2021-09-04 ENCOUNTER — Telehealth (HOSPITAL_COMMUNITY): Payer: Medicare Other | Admitting: Psychiatry

## 2021-09-11 ENCOUNTER — Ambulatory Visit (HOSPITAL_COMMUNITY): Payer: Medicare Other | Admitting: Licensed Clinical Social Worker

## 2021-09-12 ENCOUNTER — Telehealth (HOSPITAL_COMMUNITY): Payer: Self-pay | Admitting: Psychiatry

## 2021-09-12 NOTE — Telephone Encounter (Signed)
Patient presented in office regarding missed appointments at [facility] office. He stated that he has been calling the office and unable to reach anyone or receive a call back. Writer assisted patient in setting up mychart account and has completed e-Checkin for appointment on 10/28. Patient is aware of appointment for medication tomorrow and would like to be contacted to reschedule therapy he missed due to being unable to connect via caregility app.

## 2021-09-13 ENCOUNTER — Telehealth (INDEPENDENT_AMBULATORY_CARE_PROVIDER_SITE_OTHER): Payer: Medicare Other | Admitting: Psychiatry

## 2021-09-13 ENCOUNTER — Encounter (HOSPITAL_COMMUNITY): Payer: Self-pay | Admitting: Psychiatry

## 2021-09-13 DIAGNOSIS — F4323 Adjustment disorder with mixed anxiety and depressed mood: Secondary | ICD-10-CM

## 2021-09-13 DIAGNOSIS — F331 Major depressive disorder, recurrent, moderate: Secondary | ICD-10-CM | POA: Diagnosis not present

## 2021-09-13 DIAGNOSIS — F4001 Agoraphobia with panic disorder: Secondary | ICD-10-CM

## 2021-09-13 MED ORDER — MIRTAZAPINE 15 MG PO TABS: 15.0000 mg | ORAL_TABLET | Freq: Every day | ORAL | 0 refills | Status: DC

## 2021-09-13 MED ORDER — BUSPIRONE HCL 10 MG PO TABS: 10.0000 mg | ORAL_TABLET | Freq: Two times a day (BID) | ORAL | 0 refills | Status: DC

## 2021-09-13 MED ORDER — VENLAFAXINE HCL ER 75 MG PO CP24: 75.0000 mg | ORAL_CAPSULE | Freq: Every day | ORAL | 0 refills | Status: DC
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
## 2021-09-14 ENCOUNTER — Other Ambulatory Visit: Payer: Self-pay | Admitting: Critical Care Medicine

## 2021-10-03 ENCOUNTER — Ambulatory Visit (HOSPITAL_COMMUNITY): Payer: Medicare Other | Admitting: Student in an Organized Health Care Education/Training Program

## 2021-10-18 ENCOUNTER — Telehealth (HOSPITAL_COMMUNITY): Payer: Medicare Other | Admitting: Psychiatry

## 2021-10-22 ENCOUNTER — Ambulatory Visit (HOSPITAL_COMMUNITY): Payer: Medicare Other | Admitting: Licensed Clinical Social Worker

## 2021-10-22 ENCOUNTER — Inpatient Hospital Stay: Payer: Medicare Other | Admitting: Critical Care Medicine

## 2021-10-22 ENCOUNTER — Other Ambulatory Visit (HOSPITAL_COMMUNITY): Payer: Self-pay

## 2021-10-22 NOTE — Progress Notes (Incomplete)
Established Patient Office Visit  Subjective:  Patient ID: Spencer Mendoza, male    DOB: Apr 06, 1961  Age: 60 y.o. MRN: [id number]  CC: No chief complaint on file.   HPI [facility] presents for  Pcv 20 flu tdap  Past Medical History:  Diagnosis Date   Alcohol withdrawal ([place]) 07/29/2013   Allergy    Anxiety    Arthritis    Benzodiazepine abuse ([place]) 08/30/2019   Bipolar disorder ([place])    CAD (coronary artery disease)    Chronic insomnia 06/06/2015   Chronic migraine without aura, with intractable migraine, so stated, with status migrainosus 08/25/2019   Claustrophobia    Cocaine abuse ([place]) 05/22/2015   COPD (chronic obstructive pulmonary disease) ([place])    Delirium tremens ([place]) 07/29/2013   Depression    Epilepsy ([place])    last seizure week of 06-25-2020- sev. grand mal seizures per pt    Hemiplegic migraine with status migrainosus 06/06/2015   Hypertension    Migraine    Opioid use disorder 08/30/2019   PONV (postoperative nausea and vomiting)    Stroke [facility])    patient denies- states had a hemiplegic migraine in ~2011   Substance abuse ([place])     Past Surgical History:  Procedure Laterality Date   ANKLE SURGERY     in high school   APPENDECTOMY     COLONOSCOPY     KNEE ARTHROSCOPY Right    NOSE SURGERY      Family History  Problem Relation Age of Onset   Heart disease Mother    Stroke Mother    Epilepsy Mother    Heart failure Mother    Cancer Father    Heart disease Father    Hypertension Father    Melanoma Father    Migraines Father    Colon cancer Sister 63   Colon cancer Cousin    Cancer Cousin    Colon polyps Neg Hx    Esophageal cancer Neg Hx    Rectal cancer Neg Hx    Stomach cancer Neg Hx     Social History   Socioeconomic History   Marital status: Legally Separated    Spouse name: Not on file   Number of children: 0   Years of education: GED   Highest education level: Not on  file  Occupational History   Occupation: disabled  Tobacco Use   Smoking status: Every Day    Years: 15.00    Types: Cigarettes   Smokeless tobacco: Never   Tobacco comments:    Reports 2 cigarettes daily  Vaping Use   Vaping Use: Never used  Substance and Sexual Activity   Alcohol use: Not Currently    Alcohol/week: 6.0 standard drinks    Types: 6 Cans of beer per week    Comment: none since Sept 2020   Drug use: Not Currently    Types: Marijuana    Comment: pt denies   Sexual activity: Yes    Birth control/protection: Condom  Other Topics Concern   Not on file  Social History Narrative   Pt lives with sister.   Patient drinks 2 cups of caffeine daily.   Patient is right handed.   Social Determinants of Health   Financial Resource Strain: Not on file  Food Insecurity: Not on file  Transportation Needs: Not on file  Physical Activity: Not on file  Stress: Not on file  Social Connections: Not on file  Intimate Partner Violence: Not on file  Outpatient Medications Prior to Visit  Medication Sig Dispense Refill   albuterol (VENTOLIN HFA) 108 (90 Base) MCG/ACT inhaler INHALE 2 PUFFS BY MOUTH INTO THE LUNGS EVERY 4 HOURS AS NEEDED FOR SHORTNESS OF BREATH OR WHEEZING 8.5 g 3   ALPRAZolam (XANAX) 0.25 MG tablet Take 0.25 mg by mouth 2 (two) times daily as needed for anxiety.     aspirin EC 81 MG tablet Take 1 tablet (81 mg total) by mouth daily. Swallow whole. 30 tablet 11   budesonide-formoterol (SYMBICORT) 160-4.5 MCG/ACT inhaler Inhale 2 puffs into the lungs 2 (two) times daily. 10.2 g 5   famotidine (PEPCID) 20 MG tablet TAKE 1 TABLET(20 MG) BY MOUTH AT BEDTIME 90 tablet 1   folic acid (FOLVITE) 1 MG tablet Take 1 tablet (1 mg total) by mouth daily. 30 tablet 3   levETIRAcetam (KEPPRA) 1000 MG tablet Take 1 tablet (1,000 mg total) by mouth 2 (two) times daily. 60 tablet 11   lisinopril (ZESTRIL) 10 MG tablet Take 1 tablet (10 mg total) by mouth daily.  30 tablet 0   mirtazapine (REMERON) 15 MG tablet Take 1 tablet (15 mg total) by mouth at bedtime. 30 tablet 0   pantoprazole (PROTONIX) 40 MG tablet TAKE 1 TABLET(40 MG) BY MOUTH DAILY 30 MINUTES BEFORE BREAKFAST 90 tablet 1   rosuvastatin (CRESTOR) 10 MG tablet Take 1 tablet (10 mg total) by mouth daily. 90 tablet 3   sucralfate (CARAFATE) 1 g tablet Take 1 tablet (1 g total) by mouth 4 (four) times daily -  with meals and at bedtime. 21 tablet 0   thiamine 100 MG tablet Take 1 tablet (100 mg total) by mouth daily. 30 tablet 3   venlafaxine XR (EFFEXOR-XR) 75 MG 24 hr capsule Take 1 capsule (75 mg total) by mouth daily with breakfast. 30 capsule 0   No facility-administered medications prior to visit.    Allergies  Allergen Reactions   Sertraline Other (See Comments)    Erectile dysfunction   Codeine Nausea And Vomiting   Depakote [Divalproex Sodium] Other (See Comments)    Made patient shake   Migranal [Dihydroergotamine] Nausea And Vomiting   Topamax [Topiramate] Other (See Comments)    Made patient angry    ROS Review of Systems    Objective:    Physical Exam  There were no vitals taken for this visit. Wt Readings from Last 3 Encounters:  06/26/21 156 lb (70.8 kg)  06/25/21 156 lb (70.8 kg)  03/19/21 166 lb 6.4 oz (75.5 kg)     Health Maintenance Due  Topic Date Due   TETANUS/TDAP  Never done   Zoster Vaccines- Shingrix (1 of 2) Never done   Pneumococcal Vaccine 7-36 Years old (2 - PCV) 04/21/2020   INFLUENZA VACCINE  06/17/2021    There are no preventive care reminders to display for this patient.  Lab Results  Component Value Date   TSH 1.898 08/28/2021   Lab Results  Component Value Date   WBC 8.9 08/27/2021   HGB 14.2 08/27/2021   HCT 40.6 08/27/2021   MCV 90.8 08/27/2021   PLT 245 08/27/2021   Lab Results  Component Value Date   NA 139 09/02/2021   K 3.1 (L) 09/02/2021   CO2 31 09/02/2021   GLUCOSE 92 09/02/2021   BUN 8  09/02/2021   CREATININE 0.84 09/02/2021   BILITOT 1.2 08/27/2021   ALKPHOS 81 08/27/2021   AST 34 08/27/2021   ALT 26 08/27/2021   PROT 5.9 (  L) 08/27/2021   ALBUMIN 3.7 08/27/2021   CALCIUM 9.3 09/02/2021   ANIONGAP 5 09/02/2021   Lab Results  Component Value Date   CHOL 142 08/28/2021   Lab Results  Component Value Date   HDL 66 08/28/2021   Lab Results  Component Value Date   LDLCALC 58 08/28/2021   Lab Results  Component Value Date   TRIG 91 08/28/2021   Lab Results  Component Value Date   CHOLHDL 2.2 08/28/2021   Lab Results  Component Value Date   HGBA1C 4.8 08/31/2021      Assessment & Plan:   Problem List Items Addressed This Visit   None   No orders of the defined types were placed in this encounter.   Follow-up: No follow-ups on file.    Asencion Noble, MD

## 2021-10-29 ENCOUNTER — Encounter: Payer: Self-pay | Admitting: Neurology

## 2021-10-29 ENCOUNTER — Ambulatory Visit: Payer: Medicare Other | Admitting: Neurology

## 2021-10-29 NOTE — Progress Notes (Deleted)
PATIENT: Spencer Mendoza DOB: [birth year]  REASON FOR VISIT: follow up HISTORY FROM: patient Primary Neurologist: Dr. April Mendoza (Dr. Jannifer Mendoza retired)  HISTORY OF PRESENT ILLNESS: Today 10/29/21 Spencer Mendoza   Update 02/25/2021 SS: Spencer Mendoza is a 60 year old male with history of episodic seizures associated with generalized tonic-clonic events, bipolar disorder, and migraine headaches.  Remains on Keppra 1000 mg twice daily, denies any seizure events in the last 6 months.  On average, 2 migraines a month, he keeps a Decadron taper on hand in the event of a prolonged headache, for mild headache takes Tylenol or BCs.  Claims he may only use once Decadron every other month.  Was last sent in January by his PCP.  Botox has been most beneficial, but he has not been able to afford the Botox co-pay.  Claims his mental health is doing well, can no longer get Xanax.  Takes BuSpar.  He works a few days a month, directing traffic on a Architect site.  He is involved in his church.  He presents today for evaluation accompanied by his fiance, Spencer Mendoza.  Update 08/27/2020 SS: [name] is a 60 year old male with history of episodic seizures associated with generalized tonic-clonic events, bipolar disorder, and migraine headaches.  He is on trazodone 200 mg at bedtime, Keppra 1000 mg twice a day, and received Botox therapy.  At last visit, Keppra was increased due to reported seizure 02/15/2020.  No seizure since.  Has had weight loss, has stabilized around 152 lbs, was 155 lbs 6 month ago.  Had colonoscopy, a few precancer polyps removed.  No etiology for weight loss found.  Recently having significant panic attacks, PCP put him on a short course of Xanax PRN, seen new [facility] psychiatry in November.  Not working, on disability.  More headache, could not afford Botox co-pay, last Botox in March 2021, Botox was really beneficial for him.  Continues to smoke cigarettes, we talked about history of epilepsy versus  pseudoseizures, indicates epilepsy since he was born, his mom had epilepsy.  No EtOH in over 1 year.  Here today accompanied by his significant other.  HISTORY 02/23/2020 SS: [name] is a 60 year old male with history of episodic seizures associated with generalized tonic-clonic events, bipolar disorder, and migraine headache.  He is taking trazodone 200 mg at bedtime, Keppra 750 mg twice a day.  He received Botox therapy in November and March for migraines.  Botox has been very helpful for his headaches, did have 1 severe headache following his Botox injection in March, keeps Decadron taper on hand, took this with good benefit once in March.  Overall, headaches doing well.  He may have had a seizure 02/15/2020, he thinks that he did, is unclear of details, worked that day, was stressed out, came home, had preseizure feeling, laid down, unclear what happened next or does not want to elaborate.  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 Yesterday complained of chest pain, pain in his left arm, shortness of breath, vomiting.  Denies any EtOH, drug use.  Is smoking half pack a day.  In less than 2 months, has lost about 15 pounds unintentionally.  He presents today accompanied by his fiance Spencer Mendoza.  REVIEW OF SYSTEMS: Out of a complete 14 system review of symptoms, the patient complains only of the following symptoms, and all other reviewed systems are negative.  Headache, seizure  ALLERGIES: Allergies  Allergen Reactions   Sertraline Other (See Comments)    Erectile dysfunction   Codeine Nausea And Vomiting   Depakote [Divalproex Sodium] Other (See Comments)    Made patient shake   Migranal [Dihydroergotamine] Nausea And Vomiting   Topamax [Topiramate] Other (See Comments)    Made patient angry    HOME MEDICATIONS: Outpatient Medications Prior to Visit  Medication Sig Dispense  Refill   albuterol (VENTOLIN HFA) 108 (90 Base) MCG/ACT inhaler INHALE 2 PUFFS BY MOUTH INTO THE LUNGS EVERY 4 HOURS AS NEEDED FOR SHORTNESS OF BREATH OR WHEEZING 8.5 g 3   ALPRAZolam (XANAX) 0.25 MG tablet Take 0.25 mg by mouth 2 (two) times daily as needed for anxiety.     aspirin EC 81 MG tablet Take 1 tablet (81 mg total) by mouth daily. Swallow whole. 30 tablet 11   budesonide-formoterol (SYMBICORT) 160-4.5 MCG/ACT inhaler Inhale 2 puffs into the lungs 2 (two) times daily. 10.2 g 5   famotidine (PEPCID) 20 MG tablet TAKE 1 TABLET(20 MG) BY MOUTH AT BEDTIME 90 tablet 1   folic acid (FOLVITE) 1 MG tablet Take 1 tablet (1 mg total) by mouth daily. 30 tablet 3   levETIRAcetam (KEPPRA) 1000 MG tablet Take 1 tablet (1,000 mg total) by mouth 2 (two) times daily. 60 tablet 11   lisinopril (ZESTRIL) 10 MG tablet Take 1 tablet (10 mg total) by mouth daily. 30 tablet 0   mirtazapine (REMERON) 15 MG tablet Take 1 tablet (15 mg total) by mouth at bedtime. 30 tablet 0   pantoprazole (PROTONIX) 40 MG tablet TAKE 1 TABLET(40 MG) BY MOUTH DAILY 30 MINUTES BEFORE BREAKFAST 90 tablet 1   rosuvastatin (CRESTOR) 10 MG tablet Take 1 tablet (10 mg total) by mouth daily. 90 tablet 3   sucralfate (CARAFATE) 1 g tablet Take 1 tablet (1 g total) by mouth 4 (four) times daily -  with meals and at bedtime. 21 tablet 0   thiamine 100 MG tablet Take 1 tablet (100 mg total) by mouth daily. 30 tablet 3   venlafaxine XR (EFFEXOR-XR) 75 MG 24 hr capsule Take 1 capsule (75 mg total) by mouth daily with breakfast. 30 capsule 0   No facility-administered medications prior to visit.    PAST MEDICAL HISTORY: Past Medical History:  Diagnosis Date   Alcohol withdrawal ([place]) 07/29/2013   Allergy    Anxiety    Arthritis    Benzodiazepine abuse ([place]) 08/30/2019   Bipolar disorder ([place])    CAD (coronary artery disease)    Chronic insomnia 06/06/2015   Chronic migraine without aura, with intractable migraine, so stated, with  status migrainosus 08/25/2019   Claustrophobia    Cocaine abuse ([place]) 05/22/2015   COPD (chronic obstructive pulmonary disease) ([place])    Delirium tremens ([place]) 07/29/2013   Depression    Epilepsy ([place])    last seizure week of 06-25-2020- sev. grand mal seizures per pt    Hemiplegic migraine with status migrainosus 06/06/2015   Hypertension    Migraine    Opioid use disorder 08/30/2019   PONV (postoperative nausea and vomiting)    Stroke [facility])    patient denies- states had a hemiplegic migraine in ~2011   Substance abuse ([place])     PAST SURGICAL HISTORY: Past Surgical History:  Procedure Laterality Date   ANKLE SURGERY     in high school   APPENDECTOMY     COLONOSCOPY     KNEE ARTHROSCOPY Right    NOSE SURGERY  FAMILY HISTORY: Family History  Problem Relation Age of Onset   Heart disease Mother    Stroke Mother    Epilepsy Mother    Heart failure Mother    Cancer Father    Heart disease Father    Hypertension Father    Melanoma Father    Migraines Father    Colon cancer Sister 60   Colon cancer Cousin    Cancer Cousin    Colon polyps Neg Hx    Esophageal cancer Neg Hx    Rectal cancer Neg Hx    Stomach cancer Neg Hx     SOCIAL HISTORY: Social History   Socioeconomic History   Marital status: Legally Separated    Spouse name: Not on file   Number of children: 0   Years of education: GED   Highest education level: Not on file  Occupational History   Occupation: disabled  Tobacco Use   Smoking status: Every Day    Years: 15.00    Types: Cigarettes   Smokeless tobacco: Never   Tobacco comments:    Reports 2 cigarettes daily  Vaping Use   Vaping Use: Never used  Substance and Sexual Activity   Alcohol use: Not Currently    Alcohol/week: 6.0 standard drinks    Types: 6 Cans of beer per week    Comment: none since Sept 2020   Drug use: Not Currently    Types: Marijuana    Comment: pt denies   Sexual activity: Yes    Birth control/protection:  Condom  Other Topics Concern   Not on file  Social History Narrative   Pt lives with sister.   Patient drinks 2 cups of caffeine daily.   Patient is right handed.   Social Determinants of Health   Financial Resource Strain: Not on file  Food Insecurity: Not on file  Transportation Needs: Not on file  Physical Activity: Not on file  Stress: Not on file  Social Connections: Not on file  Intimate Partner Violence: Not on file   PHYSICAL EXAM  There were no vitals filed for this visit.  There is no height or weight on file to calculate BMI.  Generalized: Well developed, in no acute distress  Neurological examination  Mentation: Alert oriented to time, place, history taking. Follows all commands speech and language fluent Cranial nerve II-XII: Pupils were equal round reactive to light. Extraocular movements were full, visual field were full on confrontational test. Facial sensation and strength were normal. Head turning and shoulder shrug were normal and symmetric. Motor: The motor testing reveals 5 over 5 strength of all 4 extremities. Good symmetric motor tone is noted throughout.  Sensory: Sensory testing is intact to soft touch on all 4 extremities. No evidence of extinction is noted.  Coordination: Cerebellar testing reveals good finger-nose-finger and heel-to-shin bilaterally.   Gait and station: Gait is normal.  Tandem gait is normal. Reflexes: Normal 2+ DIAGNOSTIC DATA (LABS, IMAGING, TESTING) - I reviewed patient records, labs, notes, testing and imaging myself where available.  Lab Results  Component Value Date   WBC 8.9 08/27/2021   HGB 14.2 08/27/2021   HCT 40.6 08/27/2021   MCV 90.8 08/27/2021   PLT 245 08/27/2021      Component Value Date/Time   NA 139 09/02/2021 0620   NA 139 07/13/2020 0921   K 3.1 (L) 09/02/2021 0620   CL 103 09/02/2021 0620   CO2 31 09/02/2021 0620   GLUCOSE 92 09/02/2021 0620   BUN 8  09/02/2021 0620   BUN 11 07/13/2020 0921    CREATININE 0.84 09/02/2021 0620   CREATININE 0.99 03/13/2015 1004   CALCIUM 9.3 09/02/2021 0620   PROT 5.9 (L) 08/27/2021 0443   PROT 7.0 07/13/2020 0921   ALBUMIN 3.7 08/27/2021 0443   ALBUMIN 5.0 (H) 07/13/2020 0921   AST 34 08/27/2021 0443   ALT 26 08/27/2021 0443   ALKPHOS 81 08/27/2021 0443   BILITOT 1.2 08/27/2021 0443   BILITOT 0.5 07/13/2020 0921   GFRNONAA >60 09/02/2021 0620   GFRNONAA 85 03/13/2015 1004   GFRAA 91 07/13/2020 0921   GFRAA >89 03/13/2015 1004   Lab Results  Component Value Date   CHOL 142 08/28/2021   HDL 66 08/28/2021   LDLCALC 58 08/28/2021   TRIG 91 08/28/2021   CHOLHDL 2.2 08/28/2021   Lab Results  Component Value Date   HGBA1C 4.8 08/31/2021   No results found for: [id number] Lab Results  Component Value Date   TSH 1.898 08/28/2021   ASSESSMENT AND PLAN 60 y.o. year old male  has a past medical history of Alcohol withdrawal ([place]) (07/29/2013), Allergy, Anxiety, Arthritis, Benzodiazepine abuse ([place]) (08/30/2019), Bipolar disorder ([place]), CAD (coronary artery disease), Chronic insomnia (06/06/2015), Chronic migraine without aura, with intractable migraine, so stated, with status migrainosus (08/25/2019), Claustrophobia, Cocaine abuse ([place]) (05/22/2015), COPD (chronic obstructive pulmonary disease) ([place]), Delirium tremens ([place]) (07/29/2013), Depression, Epilepsy ([place]), Hemiplegic migraine with status migrainosus (06/06/2015), Hypertension, Migraine, Opioid use disorder (08/30/2019), PONV (postoperative nausea and vomiting), Stroke ([place]), and Substance abuse ([place]). here with:  1.  History of seizures -Last seizure 02/15/2020, on Keppra 750 mg BID -Continue Keppra 1000 mg twice a day, no recurrent seizure  2.  Alcohol abuse, marijuana abuse -Denies as of recent  3.  Chronic insomnia -Doing well with trazodone, continue 200 mg at bedtime  4.  Chronic intractable migraine headache -Excellent benefit with Botox, last in March 2021 -Reportedly  cannot afford co-pay, no other savings options available -Sent in Decadron taper for prolonged headache PRN -Cautioned on potential for rebound headache with frequent BC -Claims has tried triptans in past without benefit (Imitrex, Maxalt), history of migraines with hemiplegic symptoms, has tried Toradol, diclofenac potassium; we may need to get coverage of Ubrelvy or Nurtec for acute headache treatment in the future  5.  Anxiety, bipolar disorder -Sees psychiatry, on BuSpar  Follow-up in 8 months or sooner if needed, call for recurrent seizure.  I spent 30 minutes of face-to-face and non-face-to-face time with patient.  This included previsit chart review, lab review, study review, order entry, electronic health record documentation, patient education.  Butler Denmark, AGNP-C, DNP 10/29/2021, 6:46 AM [facility] [address] [place], [place] [address] [phone]

## 2021-10-30 ENCOUNTER — Encounter (HOSPITAL_COMMUNITY): Payer: Self-pay | Admitting: Licensed Clinical Social Worker

## 2021-10-30 ENCOUNTER — Ambulatory Visit (HOSPITAL_COMMUNITY): Payer: Medicare Other | Admitting: Licensed Clinical Social Worker

## 2021-11-06 ENCOUNTER — Ambulatory Visit (HOSPITAL_COMMUNITY): Payer: Medicare Other | Admitting: Licensed Clinical Social Worker

## 2021-11-08 ENCOUNTER — Other Ambulatory Visit (HOSPITAL_COMMUNITY): Payer: Self-pay | Admitting: Psychiatry

## 2021-11-09 ENCOUNTER — Other Ambulatory Visit (HOSPITAL_COMMUNITY): Payer: Self-pay | Admitting: Psychiatry

## 2021-11-25 ENCOUNTER — Emergency Department (HOSPITAL_COMMUNITY)
Admission: EM | Admit: 2021-11-25 | Discharge: 2021-11-25 | Disposition: A | Discharge status: Home / Self Care | Payer: Medicare Other | Attending: Student | Admitting: Student

## 2021-11-25 ENCOUNTER — Encounter (HOSPITAL_COMMUNITY): Payer: Self-pay

## 2021-11-25 ENCOUNTER — Emergency Department (HOSPITAL_COMMUNITY): Payer: Medicare Other

## 2021-11-25 DIAGNOSIS — R52 Pain, unspecified: Secondary | ICD-10-CM

## 2021-11-25 DIAGNOSIS — I10 Essential (primary) hypertension: Secondary | ICD-10-CM | POA: Diagnosis not present

## 2021-11-25 DIAGNOSIS — X500XXA Overexertion from strenuous movement or load, initial encounter: Secondary | ICD-10-CM | POA: Insufficient documentation

## 2021-11-25 DIAGNOSIS — Z79899 Other long term (current) drug therapy: Secondary | ICD-10-CM | POA: Insufficient documentation

## 2021-11-25 DIAGNOSIS — J449 Chronic obstructive pulmonary disease, unspecified: Secondary | ICD-10-CM | POA: Insufficient documentation

## 2021-11-25 DIAGNOSIS — I2510 Atherosclerotic heart disease of native coronary artery without angina pectoris: Secondary | ICD-10-CM | POA: Insufficient documentation

## 2021-11-25 DIAGNOSIS — M25511 Pain in right shoulder: Secondary | ICD-10-CM | POA: Diagnosis not present

## 2021-11-25 DIAGNOSIS — F17210 Nicotine dependence, cigarettes, uncomplicated: Secondary | ICD-10-CM | POA: Insufficient documentation

## 2021-11-25 DIAGNOSIS — Z7982 Long term (current) use of aspirin: Secondary | ICD-10-CM | POA: Insufficient documentation

## 2021-11-25 MED ORDER — NAPROXEN 375 MG PO TABS: 375.0000 mg | ORAL_TABLET | Freq: Two times a day (BID) | ORAL | 0 refills | Status: DC

## 2021-11-25 MED ORDER — HYDROCODONE-ACETAMINOPHEN 5-325 MG PO TABS
1.0000 | ORAL_TABLET | Freq: Once | ORAL | Status: AC
  Administered 2021-11-25: 1 via ORAL
  Filled 2021-11-25: qty 1

## 2021-11-25 MED ORDER — OXYCODONE HCL 5 MG PO TABS: 5.0000 mg | ORAL_TABLET | ORAL | 0 refills | Status: DC | PRN

## 2021-11-25 MED ORDER — ACETAMINOPHEN 500 MG PO TABS: 500.0000 mg | ORAL_TABLET | Freq: Four times a day (QID) | ORAL | 0 refills | Status: DC | PRN

## 2021-11-25 MED ORDER — KETOROLAC TROMETHAMINE 15 MG/ML IJ SOLN
15.0000 mg | Freq: Once | INTRAMUSCULAR | Status: AC
  Administered 2021-11-25: 15 mg via INTRAMUSCULAR
  Filled 2021-11-25: qty 1

## 2021-11-25 NOTE — ED Triage Notes (Signed)
Pt arrived via POV, c/o right shoulder pain after lifting heavy trash barrel. Now unable to move right shoulder. Visible difference in height of shoulders.

## 2021-11-25 NOTE — ED Provider Notes (Signed)
[facility] DEPT Provider Note  CSN: [id number] Arrival date & time: 11/25/21 5397  Chief Complaint(s) Shoulder Pain  HPI Spencer Mendoza is a 61 y.o. male with PMH epilepsy, HTN, alcohol and benzodiazepine abuse who presents the emergency department for evaluation of an arm injury.  Patient states that he was carrying a heavy drum that slipped out of his hand and pulled his right upper extremity with downward traction.  He states he heard a ripping and popping of the extremity and currently is unable to raise the arm above his head.  He arrives with significant pain in the shoulder but denies numbness or tingling.  Pulses intact.  No additional traumatic complaints.   Shoulder Pain  Past Medical History Past Medical History:  Diagnosis Date   Alcohol withdrawal ([place]) 07/29/2013   Allergy    Anxiety    Arthritis    Benzodiazepine abuse ([place]) 08/30/2019   Bipolar disorder ([place])    CAD (coronary artery disease)    Chronic insomnia 06/06/2015   Chronic migraine without aura, with intractable migraine, so stated, with status migrainosus 08/25/2019   Claustrophobia    Cocaine abuse ([place]) 05/22/2015   COPD (chronic obstructive pulmonary disease) ([place])    Delirium tremens ([place]) 07/29/2013   Depression    Epilepsy ([place])    last seizure week of 06-25-2020- sev. grand mal seizures per pt    Hemiplegic migraine with status migrainosus 06/06/2015   Hypertension    Migraine    Opioid use disorder 08/30/2019   PONV (postoperative nausea and vomiting)    Stroke [facility])    patient denies- states had a hemiplegic migraine in ~2011   Substance abuse [facility])    Patient Active Problem List   Diagnosis Date Noted   Prolonged Q-T interval on ECG    Intentional overdose ([place]) 08/26/2021   Exanthem due to herpes zoster 04/01/2021   Abdominal aortic atherosclerosis ([place]) with 50% occlusion distal aorta 03/05/2020   CAD (coronary artery disease) 03/05/2020    Osteoarthritis of left wrist 01/04/2020   Liver hemangioma 09/21/2019   Abdominal pain 08/30/2019   Tobacco use 08/30/2019   COPD with chronic bronchitis ([place]) 08/30/2019   Chronic migraine without aura, with intractable migraine, so stated, with status migrainosus 08/25/2019   MDD (major depressive disorder), recurrent severe, without psychosis ([place]) 11/18/2018   Hemiplegic migraine with status migrainosus 06/06/2015   Chronic insomnia 06/06/2015   Seizures ([place]) 01/09/2014   Chronic low back pain 12/28/2013   History of stroke 12/04/2013   Hypertension 12/04/2013   Home Medication(s) Prior to Admission medications   Medication Sig Start Date End Date Taking? Authorizing Provider  acetaminophen (TYLENOL) 500 MG tablet Take 1 tablet (500 mg total) by mouth every 6 (six) hours as needed. 11/25/21  Yes Letha Mirabal, MD  naproxen (NAPROSYN) 375 MG tablet Take 1 tablet (375 mg total) by mouth 2 (two) times daily. 11/25/21  Yes Mayuri Staples, MD  oxyCODONE (ROXICODONE) 5 MG immediate release tablet Take 1 tablet (5 mg total) by mouth every 4 (four) hours as needed for severe pain. 11/25/21  Yes Asmar Brozek, MD  albuterol (VENTOLIN HFA) 108 (90 Base) MCG/ACT inhaler INHALE 2 PUFFS BY MOUTH INTO THE LUNGS EVERY 4 HOURS AS NEEDED FOR SHORTNESS OF BREATH OR WHEEZING 03/19/21   Elsie Stain, MD  ALPRAZolam Duanne Moron) 0.25 MG tablet Take 0.25 mg by mouth 2 (two) times daily as needed for anxiety.    [provider]  aspirin EC 81  MG tablet Take 1 tablet (81 mg total) by mouth daily. Swallow whole. 07/17/20   Jerline Pain, MD  budesonide-formoterol (SYMBICORT) 160-4.5 MCG/ACT inhaler Inhale 2 puffs into the lungs 2 (two) times daily. 03/19/21   Elsie Stain, MD  busPIRone (BUSPAR) 10 MG tablet TAKE 1 TABLET(10 MG) BY MOUTH TWICE DAILY 11/12/21   Merian Capron, MD  famotidine (PEPCID) 20 MG tablet TAKE 1 TABLET(20 MG) BY MOUTH AT BEDTIME 06/25/21   Noralyn Pick, NP  folic acid  (FOLVITE) 1 MG tablet Take 1 tablet (1 mg total) by mouth daily. 03/19/21   Elsie Stain, MD  levETIRAcetam (KEPPRA) 1000 MG tablet Take 1 tablet (1,000 mg total) by mouth 2 (two) times daily. 02/25/21   Suzzanne Cloud, NP  lisinopril (ZESTRIL) 10 MG tablet Take 1 tablet (10 mg total) by mouth daily. 09/04/21 10/04/21  Briant Cedar, MD  mirtazapine (REMERON) 15 MG tablet TAKE 1 TABLET(15 MG) BY MOUTH AT BEDTIME 11/12/21   Merian Capron, MD  pantoprazole (PROTONIX) 40 MG tablet TAKE 1 TABLET(40 MG) BY MOUTH DAILY 30 MINUTES BEFORE BREAKFAST 06/25/21   Noralyn Pick, NP  rosuvastatin (CRESTOR) 10 MG tablet Take 1 tablet (10 mg total) by mouth daily. 03/19/21   Elsie Stain, MD  sucralfate (CARAFATE) 1 g tablet Take 1 tablet (1 g total) by mouth 4 (four) times daily -  with meals and at bedtime. 09/03/21   Briant Cedar, MD  thiamine 100 MG tablet Take 1 tablet (100 mg total) by mouth daily. 03/19/21   Elsie Stain, MD  venlafaxine XR (EFFEXOR-XR) 75 MG 24 hr capsule TAKE 1 CAPSULE(75 MG) BY MOUTH DAILY WITH BREAKFAST 11/12/21   Merian Capron, MD                                                                                                                                    Past Surgical History Past Surgical History:  Procedure Laterality Date   ANKLE SURGERY     in high school   APPENDECTOMY     COLONOSCOPY     KNEE ARTHROSCOPY Right    NOSE SURGERY     Family History Family History  Problem Relation Age of Onset   Heart disease Mother    Stroke Mother    Epilepsy Mother    Heart failure Mother    Cancer Father    Heart disease Father    Hypertension Father    Melanoma Father    Migraines Father    Colon cancer Sister 66   Colon cancer Cousin    Cancer Cousin    Colon polyps Neg Hx    Esophageal cancer Neg Hx    Rectal cancer Neg Hx    Stomach cancer Neg Hx     Social History Social History   Tobacco Use   Smoking status: Every Day     Years: 15.00  Types: Cigarettes   Smokeless tobacco: Never   Tobacco comments:    Reports 2 cigarettes daily  Vaping Use   Vaping Use: Never used  Substance Use Topics   Alcohol use: Not Currently    Alcohol/week: 6.0 standard drinks    Types: 6 Cans of beer per week    Comment: none since Sept 2020   Drug use: Not Currently    Types: Marijuana    Comment: pt denies   Allergies Sertraline, Codeine, Depakote [divalproex sodium], Migranal [dihydroergotamine], and Topamax [topiramate]  Review of Systems Review of Systems  Musculoskeletal:  Positive for arthralgias and myalgias.   Physical Exam Vital Signs  I have reviewed the triage vital signs BP 118/81 (BP Location: Left Arm)    Pulse 79    Temp 98.2 F (36.8 C) (Oral)    Resp (!) 22    SpO2 96%   Physical Exam Vitals and nursing note reviewed.  Constitutional:      General: He is not in acute distress.    Appearance: He is well-developed.  HENT:     Head: Normocephalic and atraumatic.  Eyes:     Conjunctiva/sclera: Conjunctivae normal.  Cardiovascular:     Rate and Rhythm: Normal rate and regular rhythm.     Heart sounds: No murmur heard. Pulmonary:     Effort: Pulmonary effort is normal. No respiratory distress.     Breath sounds: Normal breath sounds.  Abdominal:     Palpations: Abdomen is soft.     Tenderness: There is no abdominal tenderness.  Musculoskeletal:        General: Swelling and tenderness (Shoulder, right) present.     Cervical back: Neck supple.  Skin:    General: Skin is warm and dry.     Capillary Refill: Capillary refill takes less than 2 seconds.  Neurological:     Mental Status: He is alert.  Psychiatric:        Mood and Affect: Mood normal.    ED Results and Treatments Labs (all labs ordered are listed, but only abnormal results are displayed) Labs Reviewed - No data to display                                                                                                                         Radiology DG Shoulder Right  Result Date: 11/25/2021 CLINICAL DATA:  Right shoulder pain with decreased range of motion EXAM: RIGHT SHOULDER - 2+ VIEW COMPARISON:  None. FINDINGS: There is no evidence of fracture or dislocation. There is no evidence of significant arthropathy or other focal bone abnormality. Soft tissues are unremarkable. IMPRESSION: Negative. Electronically Signed   By: Davina Poke D.O.   On: 11/25/2021 10:41    Pertinent labs & imaging results that were available during my care of the patient were reviewed by me and considered in my medical decision making (see MDM for details).  Medications Ordered in ED Medications  HYDROcodone-acetaminophen (NORCO/VICODIN) 5-325 MG per tablet 1  tablet (1 tablet Oral Given 11/25/21 1035)  ketorolac (TORADOL) 15 MG/ML injection 15 mg (15 mg Intramuscular Given 11/25/21 1059)                                                                                                                                     Procedures Procedures  (including critical care time)  Medical Decision Making / ED Course   This patient presents to the ED for concern of a right shoulder injury, this involves an extensive number of treatment options, and is a complaint that carries with it a high risk of complications and morbidity.  The differential diagnosis includes dislocation, fracture, subluxation, brachial plexus injury   Additional history obtained: -External records from outside source obtained and reviewed including: Chart review including previous notes, labs, imaging, consultation notes   Imaging Studies ordered: -I ordered imaging studies including x-ray of the shoulder -I independently visualized and interpreted imaging which showed no acute abnormalities -I agree with the radiologist interpretation which shows the same   Medicines ordered and prescription drug management: -I ordered medication including Norco and Toradol for the  patient's shoulder pain -Reevaluation of the patient after these medicines showed that the patient improved -I have reviewed the patients home medicines and have made adjustments as needed   Consultations Obtained: I requested consultation with the orthopedic surgeon,  and discussed lab and imaging findings as well as pertinent plan - they recommend: Urgent outpatient follow-up tomorrow.   ED Course: Patient arriving with significant right upper extremity and shoulder pain.  Initial concern for dislocation given asymmetry on initial exam.  X-ray with no acute abnormalities.  Based on mechanism, concern for subluxation versus brachial plexus injury versus rotator cuff tear.  Patient placed in a sling and I spoke with the orthopedic surgeon who will see the patient tomorrow.  Patient then discharged with orthopedic follow-up    Reevaluation: After the interventions noted above, I reevaluated the patient and found that they have :improved   Dispostion: DC with ortho follow up      Final Clinical Impression(s) / ED Diagnoses Final diagnoses:  Acute pain of right shoulder     @PCDICTATION @    Teressa Lower, MD 11/25/21 1718

## 2021-11-25 NOTE — Progress Notes (Signed)
Orthopedic Tech Progress Note Patient Details:  Spencer Mendoza [birth year] [id number]  Ortho Devices Type of Ortho Device: Shoulder immobilizer Ortho Device/Splint Interventions: Application   Post Interventions Patient Tolerated: Well Instructions Provided: Care of device  [place] Pink 11/25/2021, 10:59 AM

## 2021-11-26 ENCOUNTER — Ambulatory Visit: Payer: Self-pay

## 2021-11-26 DIAGNOSIS — S46011A Strain of muscle(s) and tendon(s) of the rotator cuff of right shoulder, initial encounter: Secondary | ICD-10-CM | POA: Diagnosis not present

## 2021-11-26 NOTE — Telephone Encounter (Signed)
° °  Chief Complaint: Would like an appointment to discuss depression medications. Symptoms: Shaun with [facility] called reporting he felt like pt. Needs medication adjustment. States pt. Reports "some insomnia, anxiety." Frequency: n/a Pertinent Negatives: Patient denies "any big problems." Disposition: [] ED /[] Urgent Care (no appt availability in office) / [] Appointment(In office/virtual)/ []  [facility]/ [] Home Care/ [] Refused Recommended Disposition /[] [place] [facility]/ []  Follow-up with PCP Additional Notes: Pt. Would like to be worked in with Dr. Joya Gaskins to discuss his depression medications. Did not want recommendations from pharmacist. Please advise pt. Answer Assessment - Initial Assessment Questions 1. NAME of MEDICATION: "What medicine are you calling about?"     N/a 2. QUESTION: "What is your question?" (e.g., double dose of medicine, side effect)     Depression and anxiety medications 3. PRESCRIBING HCP: "Who prescribed it?" Reason: if prescribed by specialist, call should be referred to that group.     Dr. Joya Gaskins 4. SYMPTOMS: "Do you have any symptoms?"     Insomnia, some anxiety 5. SEVERITY: If symptoms are present, ask "Are they mild, moderate or severe?"     Mild 6. PREGNANCY:  "Is there any chance that you are pregnant?" "When was your last menstrual period?"     N/a  Protocols used: Medication Question Call-A-AH

## 2021-11-28 DIAGNOSIS — M25511 Pain in right shoulder: Secondary | ICD-10-CM | POA: Diagnosis not present

## 2021-12-03 ENCOUNTER — Encounter (HOSPITAL_BASED_OUTPATIENT_CLINIC_OR_DEPARTMENT_OTHER): Payer: Self-pay | Admitting: Orthopaedic Surgery

## 2021-12-03 ENCOUNTER — Other Ambulatory Visit: Payer: Self-pay

## 2021-12-03 NOTE — H&P (Signed)
PREOPERATIVE H&P  Chief Complaint: right shoulder cartilage disorder, impingement syndrome, rotator cuff tear, bicep tendinitis  HPI: Spencer Mendoza is a 61 y.o. male who is scheduled for, Procedure(s): SHOULDER ARTHROSCOPY WITH SUBACROMIAL DECOMPRESSION, ROTATOR CUFF REPAIR AND BICEP TENDON REPAIR SHOULDER ARTHROSCOPY WITH DISTAL CLAVICLE EXCISION.   Patient has a past medical history significant for coronary artery disease, follows with a cardiologist, history of seizures, reports well controlled, no seizures in the last 7-8 .   Patient is a 61 year-old male who presents following an injury on 11/25/2020 when he was lifting a trashcan that was heavy, slipped and he felt a pop in his shoulder.  He states that he was unable to move his arm at all after this happened and he went to the emergency room for this.  He had x-rays that were negative, but he was referred here.  He has been in a sling for comfort.  He was given Hydrocodone which makes him very nauseous and Naproxen which is somewhat helping his pain; however, it was very difficult for him to sleep last night.  He states the pain is radiating all the way down his arm and up into his neck, but is mostly in the lateral aspect of his shoulder.  He is right hand dominant.  His symptoms are rated as moderate to severe, and have been worsening.  This is significantly impairing activities of daily living.    Please see clinic note for further details on this patient's care.    He has elected for surgical management.   Past Medical History:  Diagnosis Date   Alcohol withdrawal ([place]) 07/29/2013   Allergy    Anxiety    Arthritis    Benzodiazepine abuse ([place]) 08/30/2019   Bipolar disorder (HCC)    CAD (coronary artery disease)    Chronic insomnia 06/06/2015   Chronic migraine without aura, with intractable migraine, so stated, with status migrainosus 08/25/2019   Claustrophobia    Cocaine abuse ([place]) 05/22/2015   COPD (chronic  obstructive pulmonary disease) ([place])    Delirium tremens ([place]) 07/29/2013   Depression    Epilepsy ([place])    last seizure week of 06-25-2020- sev. grand mal seizures per pt    Hemiplegic migraine with status migrainosus 06/06/2015   Hypertension    Migraine    Opioid use disorder 08/30/2019   PONV (postoperative nausea and vomiting)    Stroke [facility])    patient denies- states had a hemiplegic migraine in ~2011   Substance abuse ([place])    Past Surgical History:  Procedure Laterality Date   ANKLE SURGERY     in high school   APPENDECTOMY     COLONOSCOPY     KNEE ARTHROSCOPY Right    NOSE SURGERY     Social History   Socioeconomic History   Marital status: Legally Separated    Spouse name: Not on file   Number of children: 0   Years of education: GED   Highest education level: Not on file  Occupational History   Occupation: disabled  Tobacco Use   Smoking status: Every Day    Years: 15.00    Types: Cigarettes   Smokeless tobacco: Never   Tobacco comments:    Reports 2 cigarettes daily  Vaping Use   Vaping Use: Never used  Substance and Sexual Activity   Alcohol use: Not Currently    Alcohol/week: 6.0 standard drinks    Types: 6 Cans of beer per week  Comment: none since Sept 2020   Drug use: Not Currently    Types: Marijuana    Comment: pt denies   Sexual activity: Yes    Birth control/protection: Condom  Other Topics Concern   Not on file  Social History Narrative   Pt lives with sister.   Patient drinks 2 cups of caffeine daily.   Patient is right handed.   Social Determinants of Health   Financial Resource Strain: Not on file  Food Insecurity: Not on file  Transportation Needs: Not on file  Physical Activity: Not on file  Stress: Not on file  Social Connections: Not on file   Family History  Problem Relation Age of Onset   Heart disease Mother    Stroke Mother    Epilepsy Mother    Heart failure Mother    Cancer Father    Heart disease Father     Hypertension Father    Melanoma Father    Migraines Father    Colon cancer Sister 4   Colon cancer Cousin    Cancer Cousin    Colon polyps Neg Hx    Esophageal cancer Neg Hx    Rectal cancer Neg Hx    Stomach cancer Neg Hx    Allergies  Allergen Reactions   Sertraline Other (See Comments)    Erectile dysfunction   Codeine Nausea And Vomiting   Depakote [Divalproex Sodium] Other (See Comments)    Made patient shake   Migranal [Dihydroergotamine] Nausea And Vomiting   Topamax [Topiramate] Other (See Comments)    Made patient angry   Prior to Admission medications   Medication Sig Start Date End Date Taking? Authorizing Provider  acetaminophen (TYLENOL) 500 MG tablet Take 1 tablet (500 mg total) by mouth every 6 (six) hours as needed. 11/25/21   Kommor, Madison, MD  albuterol (VENTOLIN HFA) 108 (90 Base) MCG/ACT inhaler INHALE 2 PUFFS BY MOUTH INTO THE LUNGS EVERY 4 HOURS AS NEEDED FOR SHORTNESS OF BREATH OR WHEEZING 03/19/21   Elsie Stain, MD  ALPRAZolam Duanne Moron) 0.25 MG tablet Take 0.25 mg by mouth 2 (two) times daily as needed for anxiety.    [provider]  aspirin EC 81 MG tablet Take 1 tablet (81 mg total) by mouth daily. Swallow whole. 07/17/20   Jerline Pain, MD  budesonide-formoterol (SYMBICORT) 160-4.5 MCG/ACT inhaler Inhale 2 puffs into the lungs 2 (two) times daily. 03/19/21   Elsie Stain, MD  busPIRone (BUSPAR) 10 MG tablet TAKE 1 TABLET(10 MG) BY MOUTH TWICE DAILY 11/12/21   Merian Capron, MD  famotidine (PEPCID) 20 MG tablet TAKE 1 TABLET(20 MG) BY MOUTH AT BEDTIME 06/25/21   Noralyn Pick, NP  folic acid (FOLVITE) 1 MG tablet Take 1 tablet (1 mg total) by mouth daily. 03/19/21   Elsie Stain, MD  levETIRAcetam (KEPPRA) 1000 MG tablet Take 1 tablet (1,000 mg total) by mouth 2 (two) times daily. 02/25/21   Suzzanne Cloud, NP  lisinopril (ZESTRIL) 10 MG tablet Take 1 tablet (10 mg total) by mouth daily. 09/04/21 10/04/21  Briant Cedar,  MD  mirtazapine (REMERON) 15 MG tablet TAKE 1 TABLET(15 MG) BY MOUTH AT BEDTIME 11/12/21   Merian Capron, MD  naproxen (NAPROSYN) 375 MG tablet Take 1 tablet (375 mg total) by mouth 2 (two) times daily. 11/25/21   Kommor, Madison, MD  oxyCODONE (ROXICODONE) 5 MG immediate release tablet Take 1 tablet (5 mg total) by mouth every 4 (four) hours as needed for  severe pain. 11/25/21   Kommor, Madison, MD  pantoprazole (PROTONIX) 40 MG tablet TAKE 1 TABLET(40 MG) BY MOUTH DAILY 30 MINUTES BEFORE BREAKFAST 06/25/21   Noralyn Pick, NP  rosuvastatin (CRESTOR) 10 MG tablet Take 1 tablet (10 mg total) by mouth daily. 03/19/21   Elsie Stain, MD  sucralfate (CARAFATE) 1 g tablet Take 1 tablet (1 g total) by mouth 4 (four) times daily -  with meals and at bedtime. 09/03/21   Briant Cedar, MD  thiamine 100 MG tablet Take 1 tablet (100 mg total) by mouth daily. 03/19/21   Elsie Stain, MD  venlafaxine XR (EFFEXOR-XR) 75 MG 24 hr capsule TAKE 1 CAPSULE(75 MG) BY MOUTH DAILY WITH BREAKFAST 11/12/21   Merian Capron, MD    ROS: All other systems have been reviewed and were otherwise negative with the exception of those mentioned in the HPI and as above.  Physical Exam: General: Alert, no acute distress Cardiovascular: No pedal edema Respiratory: No cyanosis, no use of accessory musculature GI: No organomegaly, abdomen is soft and non-tender Skin: No lesions in the area of chief complaint Neurologic: Sensation intact distally Psychiatric: Patient is competent for consent with normal mood and affect Lymphatic: No axillary or cervical lymphadenopathy  MUSCULOSKELETAL:   On examination patient is a well-developed, well-nourished male in no acute distress sitting on the exam table.  He is holding his arm beside his body.  He has diffuse mild tenderness to palpation and tolerates no movement of his shoulder in any field secondary to pain.  Internal and external rotation of his shoulder are weak  and cause significant pain.  He is unable to tolerate testing abduction.  Biceps and triceps testing also causes significant pain.  He is neurovascularly intact distally.    Imaging: X-rays from the emergency room on January 9th were reviewed and did not show any acute pathology.    MRI of right shoulder demonstrates a complete retracted tear of supraspinatus and infraspinatus  Assessment: right shoulder cartilage disorder, impingement syndrome, rotator cuff tear, bicep tendinitis  Plan: Plan for Procedure(s): SHOULDER ARTHROSCOPY WITH SUBACROMIAL DECOMPRESSION, ROTATOR CUFF REPAIR AND BICEP TENDON REPAIR SHOULDER ARTHROSCOPY WITH DISTAL CLAVICLE EXCISION  The risks benefits and alternatives were discussed with the patient including but not limited to the risks of nonoperative treatment, versus surgical intervention including infection, bleeding, nerve injury,  blood clots, cardiopulmonary complications, morbidity, mortality, among others, and they were willing to proceed.   The patient acknowledged the explanation, agreed to proceed with the plan and consent was signed.   Operative Plan: Right shoulder scope with SAD, DCE, BT, RCR versus SCR Discharge Medications: Standard DVT Prophylaxis: None Physical Therapy: Outpatient PT Special Discharge needs: Sling. [place], PA-C  12/03/2021 6:56 AM

## 2021-12-04 ENCOUNTER — Encounter (HOSPITAL_BASED_OUTPATIENT_CLINIC_OR_DEPARTMENT_OTHER): Payer: Self-pay | Admitting: Orthopaedic Surgery

## 2021-12-04 ENCOUNTER — Encounter (HOSPITAL_BASED_OUTPATIENT_CLINIC_OR_DEPARTMENT_OTHER)
Admission: RE | Disposition: A | Discharge status: Home / Self Care | Payer: Self-pay | Source: Home / Self Care | Attending: Orthopaedic Surgery

## 2021-12-04 ENCOUNTER — Ambulatory Visit (HOSPITAL_BASED_OUTPATIENT_CLINIC_OR_DEPARTMENT_OTHER)
Admission: RE | Admit: 2021-12-04 | Discharge: 2021-12-04 | Disposition: A | Discharge status: Home / Self Care | Payer: Medicare Other | Attending: Orthopaedic Surgery | Admitting: Orthopaedic Surgery

## 2021-12-04 ENCOUNTER — Ambulatory Visit (HOSPITAL_BASED_OUTPATIENT_CLINIC_OR_DEPARTMENT_OTHER): Payer: Medicare Other | Admitting: Certified Registered"

## 2021-12-04 ENCOUNTER — Other Ambulatory Visit: Payer: Self-pay

## 2021-12-04 DIAGNOSIS — F1411 Cocaine abuse, in remission: Secondary | ICD-10-CM | POA: Diagnosis not present

## 2021-12-04 DIAGNOSIS — R569 Unspecified convulsions: Secondary | ICD-10-CM | POA: Diagnosis not present

## 2021-12-04 DIAGNOSIS — I10 Essential (primary) hypertension: Secondary | ICD-10-CM | POA: Diagnosis not present

## 2021-12-04 DIAGNOSIS — F418 Other specified anxiety disorders: Secondary | ICD-10-CM | POA: Insufficient documentation

## 2021-12-04 DIAGNOSIS — F17210 Nicotine dependence, cigarettes, uncomplicated: Secondary | ICD-10-CM | POA: Diagnosis not present

## 2021-12-04 DIAGNOSIS — M7541 Impingement syndrome of right shoulder: Secondary | ICD-10-CM | POA: Insufficient documentation

## 2021-12-04 DIAGNOSIS — S43431A Superior glenoid labrum lesion of right shoulder, initial encounter: Secondary | ICD-10-CM | POA: Diagnosis not present

## 2021-12-04 DIAGNOSIS — K219 Gastro-esophageal reflux disease without esophagitis: Secondary | ICD-10-CM | POA: Insufficient documentation

## 2021-12-04 DIAGNOSIS — R519 Headache, unspecified: Secondary | ICD-10-CM | POA: Insufficient documentation

## 2021-12-04 DIAGNOSIS — S46011A Strain of muscle(s) and tendon(s) of the rotator cuff of right shoulder, initial encounter: Secondary | ICD-10-CM | POA: Diagnosis not present

## 2021-12-04 DIAGNOSIS — J449 Chronic obstructive pulmonary disease, unspecified: Secondary | ICD-10-CM | POA: Insufficient documentation

## 2021-12-04 DIAGNOSIS — G8918 Other acute postprocedural pain: Secondary | ICD-10-CM | POA: Diagnosis not present

## 2021-12-04 DIAGNOSIS — M19011 Primary osteoarthritis, right shoulder: Secondary | ICD-10-CM | POA: Diagnosis not present

## 2021-12-04 DIAGNOSIS — I2510 Atherosclerotic heart disease of native coronary artery without angina pectoris: Secondary | ICD-10-CM | POA: Diagnosis not present

## 2021-12-04 DIAGNOSIS — M7521 Bicipital tendinitis, right shoulder: Secondary | ICD-10-CM | POA: Diagnosis not present

## 2021-12-04 DIAGNOSIS — F319 Bipolar disorder, unspecified: Secondary | ICD-10-CM | POA: Diagnosis not present

## 2021-12-04 DIAGNOSIS — M75111 Incomplete rotator cuff tear or rupture of right shoulder, not specified as traumatic: Secondary | ICD-10-CM | POA: Diagnosis present

## 2021-12-04 DIAGNOSIS — Z8673 Personal history of transient ischemic attack (TIA), and cerebral infarction without residual deficits: Secondary | ICD-10-CM | POA: Insufficient documentation

## 2021-12-04 DIAGNOSIS — M75121 Complete rotator cuff tear or rupture of right shoulder, not specified as traumatic: Secondary | ICD-10-CM | POA: Diagnosis not present

## 2021-12-04 DIAGNOSIS — X500XXA Overexertion from strenuous movement or load, initial encounter: Secondary | ICD-10-CM | POA: Insufficient documentation

## 2021-12-04 HISTORY — PX: SHOULDER ARTHROSCOPY WITH SUBACROMIAL DECOMPRESSION, ROTATOR CUFF REPAIR AND BICEP TENDON REPAIR: SHX5687

## 2021-12-04 HISTORY — PX: SHOULDER ARTHROSCOPY WITH DISTAL CLAVICLE RESECTION: SHX5675

## 2021-12-04 HISTORY — DX: Gastro-esophageal reflux disease without esophagitis: K21.9

## 2021-12-04 SURGERY — SHOULDER ARTHROSCOPY WITH SUBACROMIAL DECOMPRESSION, ROTATOR CUFF REPAIR AND BICEP TENDON REPAIR
Anesthesia: Regional | Site: Shoulder | Laterality: Right

## 2021-12-04 MED ORDER — MIDAZOLAM HCL 2 MG/2ML IJ SOLN
INTRAMUSCULAR | Status: AC
  Filled 2021-12-04: qty 2

## 2021-12-04 MED ORDER — DEXAMETHASONE SODIUM PHOSPHATE 10 MG/ML IJ SOLN
INTRAMUSCULAR | Status: AC
  Filled 2021-12-04: qty 1

## 2021-12-04 MED ORDER — FENTANYL CITRATE (PF) 100 MCG/2ML IJ SOLN
INTRAMUSCULAR | Status: AC
  Filled 2021-12-04: qty 2

## 2021-12-04 MED ORDER — OXYCODONE HCL 5 MG PO TABS: ORAL_TABLET | ORAL | 0 refills | Status: AC

## 2021-12-04 MED ORDER — SODIUM CHLORIDE 0.9 % IR SOLN
Status: DC | PRN
  Administered 2021-12-04: 12000 mL

## 2021-12-04 MED ORDER — AMISULPRIDE (ANTIEMETIC) 5 MG/2ML IV SOLN: 10.0000 mg | Freq: Once | INTRAVENOUS | Status: DC | PRN

## 2021-12-04 MED ORDER — PHENYLEPHRINE HCL (PRESSORS) 10 MG/ML IV SOLN
INTRAVENOUS | Status: DC | PRN
Start: 2021-12-04 — End: 2021-12-04
  Administered 2021-12-04 (×2): 40 ug via INTRAVENOUS

## 2021-12-04 MED ORDER — LIDOCAINE 2% (20 MG/ML) 5 ML SYRINGE
INTRAMUSCULAR | Status: DC | PRN
  Administered 2021-12-04: 80 mg via INTRAVENOUS

## 2021-12-04 MED ORDER — MECLIZINE HCL 25 MG PO TABS: 25.0000 mg | ORAL_TABLET | Freq: Three times a day (TID) | ORAL | 0 refills | Status: DC | PRN

## 2021-12-04 MED ORDER — DEXAMETHASONE SODIUM PHOSPHATE 10 MG/ML IJ SOLN
INTRAMUSCULAR | Status: DC | PRN
Start: 2021-12-04 — End: 2021-12-04
  Administered 2021-12-04: 5 mg via INTRAVENOUS

## 2021-12-04 MED ORDER — ACETAMINOPHEN 500 MG PO TABS
1000.0000 mg | ORAL_TABLET | Freq: Once | ORAL | Status: AC
  Administered 2021-12-04: 1000 mg via ORAL

## 2021-12-04 MED ORDER — EPHEDRINE 5 MG/ML INJ
INTRAVENOUS | Status: AC
  Filled 2021-12-04: qty 5

## 2021-12-04 MED ORDER — LABETALOL HCL 5 MG/ML IV SOLN
INTRAVENOUS | Status: AC
  Filled 2021-12-04: qty 4

## 2021-12-04 MED ORDER — HYDROMORPHONE HCL 1 MG/ML IJ SOLN
INTRAMUSCULAR | Status: AC
  Filled 2021-12-04: qty 0.5

## 2021-12-04 MED ORDER — EPHEDRINE SULFATE 50 MG/ML IJ SOLN
INTRAMUSCULAR | Status: DC | PRN
  Administered 2021-12-04: 5 mg via INTRAVENOUS

## 2021-12-04 MED ORDER — ACETAMINOPHEN 500 MG PO TABS: 1000.0000 mg | ORAL_TABLET | Freq: Three times a day (TID) | ORAL | 0 refills | Status: AC

## 2021-12-04 MED ORDER — LABETALOL HCL 5 MG/ML IV SOLN
INTRAVENOUS | Status: DC | PRN
  Administered 2021-12-04 (×2): 5 mg via INTRAVENOUS

## 2021-12-04 MED ORDER — DEXMEDETOMIDINE (PRECEDEX) IN NS 20 MCG/5ML (4 MCG/ML) IV SYRINGE
PREFILLED_SYRINGE | INTRAVENOUS | Status: DC | PRN
  Administered 2021-12-04: 8 ug via INTRAVENOUS
  Administered 2021-12-04: 4 ug via INTRAVENOUS

## 2021-12-04 MED ORDER — ONDANSETRON HCL 4 MG/2ML IJ SOLN
INTRAMUSCULAR | Status: AC
  Filled 2021-12-04: qty 2

## 2021-12-04 MED ORDER — PHENYLEPHRINE 40 MCG/ML (10ML) SYRINGE FOR IV PUSH (FOR BLOOD PRESSURE SUPPORT)
PREFILLED_SYRINGE | INTRAVENOUS | Status: AC
  Filled 2021-12-04: qty 10

## 2021-12-04 MED ORDER — ONDANSETRON HCL 4 MG/2ML IJ SOLN
INTRAMUSCULAR | Status: DC | PRN
Start: 2021-12-04 — End: 2021-12-04
  Administered 2021-12-04: 4 mg via INTRAVENOUS

## 2021-12-04 MED ORDER — ESMOLOL HCL 100 MG/10ML IV SOLN
INTRAVENOUS | Status: AC
  Filled 2021-12-04: qty 10

## 2021-12-04 MED ORDER — OXYCODONE HCL 5 MG PO TABS
5.0000 mg | ORAL_TABLET | Freq: Once | ORAL | Status: AC
  Administered 2021-12-04: 5 mg via ORAL

## 2021-12-04 MED ORDER — HYDROMORPHONE HCL 1 MG/ML IJ SOLN
0.5000 mg | INTRAMUSCULAR | Status: AC | PRN
  Administered 2021-12-04 (×4): 0.5 mg via INTRAVENOUS

## 2021-12-04 MED ORDER — ROCURONIUM BROMIDE 10 MG/ML (PF) SYRINGE
PREFILLED_SYRINGE | INTRAVENOUS | Status: AC
  Filled 2021-12-04: qty 10

## 2021-12-04 MED ORDER — LIDOCAINE 2% (20 MG/ML) 5 ML SYRINGE
INTRAMUSCULAR | Status: AC
  Filled 2021-12-04: qty 5

## 2021-12-04 MED ORDER — CEFAZOLIN SODIUM-DEXTROSE 2-4 GM/100ML-% IV SOLN
INTRAVENOUS | Status: AC
  Filled 2021-12-04: qty 100

## 2021-12-04 MED ORDER — CEFAZOLIN SODIUM-DEXTROSE 2-4 GM/100ML-% IV SOLN
2.0000 g | INTRAVENOUS | Status: AC
  Administered 2021-12-04: 2 g via INTRAVENOUS

## 2021-12-04 MED ORDER — FENTANYL CITRATE (PF) 100 MCG/2ML IJ SOLN
INTRAMUSCULAR | Status: DC | PRN
  Administered 2021-12-04: 25 ug via INTRAVENOUS
  Administered 2021-12-04: 100 ug via INTRAVENOUS
  Administered 2021-12-04: 25 ug via INTRAVENOUS
  Administered 2021-12-04: 50 ug via INTRAVENOUS

## 2021-12-04 MED ORDER — PROPOFOL 10 MG/ML IV BOLUS
INTRAVENOUS | Status: AC
  Filled 2021-12-04: qty 20

## 2021-12-04 MED ORDER — SUGAMMADEX SODIUM 200 MG/2ML IV SOLN
INTRAVENOUS | Status: DC | PRN
  Administered 2021-12-04: 150 mg via INTRAVENOUS

## 2021-12-04 MED ORDER — OXYCODONE HCL 5 MG PO TABS
ORAL_TABLET | ORAL | Status: AC
  Filled 2021-12-04: qty 1

## 2021-12-04 MED ORDER — MIDAZOLAM HCL 2 MG/2ML IJ SOLN
2.0000 mg | Freq: Once | INTRAMUSCULAR | Status: AC
  Administered 2021-12-04: 2 mg via INTRAVENOUS

## 2021-12-04 MED ORDER — ACETAMINOPHEN 500 MG PO TABS
ORAL_TABLET | ORAL | Status: AC
  Filled 2021-12-04: qty 2

## 2021-12-04 MED ORDER — ROCURONIUM BROMIDE 100 MG/10ML IV SOLN
INTRAVENOUS | Status: DC | PRN
  Administered 2021-12-04: 60 mg via INTRAVENOUS

## 2021-12-04 MED ORDER — FENTANYL CITRATE (PF) 100 MCG/2ML IJ SOLN
100.0000 ug | Freq: Once | INTRAMUSCULAR | Status: AC
  Administered 2021-12-04: 50 ug via INTRAVENOUS

## 2021-12-04 MED ORDER — BUPIVACAINE LIPOSOME 1.3 % IJ SUSP
INTRAMUSCULAR | Status: DC | PRN
  Administered 2021-12-04: 10 mL via PERINEURAL

## 2021-12-04 MED ORDER — MIDAZOLAM HCL 5 MG/5ML IJ SOLN
INTRAMUSCULAR | Status: DC | PRN
  Administered 2021-12-04 (×2): 2 mg via INTRAVENOUS

## 2021-12-04 MED ORDER — PROPOFOL 10 MG/ML IV BOLUS
INTRAVENOUS | Status: DC | PRN
  Administered 2021-12-04: 150 mg via INTRAVENOUS

## 2021-12-04 MED ORDER — FENTANYL CITRATE (PF) 100 MCG/2ML IJ SOLN
25.0000 ug | INTRAMUSCULAR | Status: DC | PRN
  Administered 2021-12-04 (×2): 50 ug via INTRAVENOUS

## 2021-12-04 MED ORDER — BUPIVACAINE HCL (PF) 0.5 % IJ SOLN
INTRAMUSCULAR | Status: DC | PRN
  Administered 2021-12-04: 15 mL via PERINEURAL

## 2021-12-04 MED ORDER — CELECOXIB 100 MG PO CAPS
100.0000 mg | ORAL_CAPSULE | Freq: Two times a day (BID) | ORAL | 0 refills | Status: AC
Start: 2021-12-04 — End: 2022-01-03

## 2021-12-04 MED ORDER — LACTATED RINGERS IV SOLN: INTRAVENOUS | Status: DC

## 2021-12-04 MED ORDER — ESMOLOL HCL 100 MG/10ML IV SOLN
INTRAVENOUS | Status: DC | PRN
  Administered 2021-12-04: 20 mg via INTRAVENOUS

## 2021-12-04 SURGICAL SUPPLY — 63 items
AID PSTN UNV HD RSTRNT DISP (MISCELLANEOUS) ×1
ANCH SUT 2 FBRTK KNTLS 1.8 (Anchor) ×2 IMPLANT
ANCH SUT 2.6 FBRSTK 1.7 (Anchor) ×2 IMPLANT
ANCH SUT 2.6 FBRTK 1.7 (Anchor) ×1 IMPLANT
ANCH SUT SWLK 19.1X4.75 (Anchor) ×3 IMPLANT
ANCHOR FIBERTAK RC 2.6 (BLUE) (Anchor) ×1 IMPLANT
ANCHOR SUT 1.8 FIBERTAK SB KL (Anchor) ×2 IMPLANT
ANCHOR SUT BIO SW 4.75X19.1 (Anchor) ×3 IMPLANT
ANCHOR SUT FBRTK 2.6X1.7X2 (Anchor) ×2 IMPLANT
APL PRP STRL LF DISP 70% ISPRP (MISCELLANEOUS) ×1
BLADE EXCALIBUR 4.0X13 (MISCELLANEOUS) ×2 IMPLANT
BLADE SURG 10 STRL SS (BLADE) IMPLANT
BURR OVAL 8 FLU 4.0X13 (MISCELLANEOUS) IMPLANT
CANNULA 5.75X71 LONG (CANNULA) IMPLANT
CANNULA PASSPORT 5 (CANNULA) IMPLANT
CANNULA PASSPORT BUTTON 10-40 (CANNULA) IMPLANT
CANNULA TWIST IN 8.25X7CM (CANNULA) IMPLANT
CHLORAPREP W/TINT 26 (MISCELLANEOUS) ×2 IMPLANT
COOLER ICEMAN CLASSIC (MISCELLANEOUS) ×2 IMPLANT
DRAPE IMP U-DRAPE 54X76 (DRAPES) ×2 IMPLANT
DRAPE INCISE IOBAN 66X45 STRL (DRAPES) IMPLANT
DRAPE SHOULDER BEACH CHAIR (DRAPES) ×2 IMPLANT
DRSG PAD ABDOMINAL 8X10 ST (GAUZE/BANDAGES/DRESSINGS) ×2 IMPLANT
DW OUTFLOW CASSETTE/TUBE SET (MISCELLANEOUS) ×2 IMPLANT
GAUZE SPONGE 4X4 12PLY STRL (GAUZE/BANDAGES/DRESSINGS) ×2 IMPLANT
GLOVE SRG 8 PF TXTR STRL LF DI (GLOVE) ×1 IMPLANT
GLOVE SURG ENC MOIS LTX SZ6.5 (GLOVE) ×2 IMPLANT
GLOVE SURG LTX SZ8 (GLOVE) ×2 IMPLANT
GLOVE SURG UNDER POLY LF SZ6.5 (GLOVE) ×2 IMPLANT
GLOVE SURG UNDER POLY LF SZ8 (GLOVE) ×2
GOWN STRL REUS W/ TWL LRG LVL3 (GOWN DISPOSABLE) ×2 IMPLANT
GOWN STRL REUS W/TWL LRG LVL3 (GOWN DISPOSABLE) ×4
GOWN STRL REUS W/TWL XL LVL3 (GOWN DISPOSABLE) ×2 IMPLANT
KIT SHOULDER STAB MARCO (KITS) ×2 IMPLANT
KIT STR SPEAR 1.8 FBRTK DISP (KITS) ×1 IMPLANT
LASSO CRESCENT QUICKPASS (SUTURE) IMPLANT
MANIFOLD NEPTUNE II (INSTRUMENTS) ×2 IMPLANT
NDL SAFETY ECLIPSE 18X1.5 (NEEDLE) ×1 IMPLANT
NDL SCORPION MULTI FIRE (NEEDLE) IMPLANT
NEEDLE HYPO 18GX1.5 SHARP (NEEDLE) ×2
NEEDLE SCORPION MULTI FIRE (NEEDLE) IMPLANT
PACK ARTHROSCOPY DSU (CUSTOM PROCEDURE TRAY) ×2 IMPLANT
PACK BASIN DAY SURGERY FS (CUSTOM PROCEDURE TRAY) ×2 IMPLANT
PAD COLD SHLDR WRAP-ON (PAD) ×2 IMPLANT
PORT APPOLLO RF 90DEGREE MULTI (SURGICAL WAND) ×2 IMPLANT
RESTRAINT HEAD UNIVERSAL NS (MISCELLANEOUS) ×2 IMPLANT
SHEET MEDIUM DRAPE 40X70 STRL (DRAPES) IMPLANT
SLEEVE SCD COMPRESS KNEE MED (STOCKING) ×2 IMPLANT
SLING ARM FOAM STRAP LRG (SOFTGOODS) IMPLANT
STRIP CLOSURE SKIN 1/2X4 (GAUZE/BANDAGES/DRESSINGS) ×2 IMPLANT
SUT FIBERWIRE #2 38 T-5 BLUE (SUTURE)
SUT MNCRL AB 4-0 PS2 18 (SUTURE) ×2 IMPLANT
SUT PDS AB 1 CT  36 (SUTURE)
SUT PDS AB 1 CT 36 (SUTURE) IMPLANT
SUT TIGER TAPE 7 IN WHITE (SUTURE) IMPLANT
SUTURE FIBERWR #2 38 T-5 BLUE (SUTURE) IMPLANT
SUTURE TAPE TIGERLINK 1.3MM BL (SUTURE) IMPLANT
SUTURETAPE TIGERLINK 1.3MM BL (SUTURE)
SYR 5ML LL (SYRINGE) ×2 IMPLANT
TAPE FIBER 2MM 7IN #2 BLUE (SUTURE) IMPLANT
TOWEL GREEN STERILE FF (TOWEL DISPOSABLE) ×4 IMPLANT
TUBE CONNECTING 20X1/4 (TUBING) ×2 IMPLANT
TUBING ARTHROSCOPY IRRIG 16FT (MISCELLANEOUS) ×2 IMPLANT

## 2021-12-04 NOTE — Anesthesia Preprocedure Evaluation (Addendum)
Anesthesia Evaluation  Patient identified by MRN, date of birth, ID band Patient awake    Reviewed: Allergy & Precautions, NPO status , Patient's Chart, lab work & pertinent test results  History of Anesthesia Complications (+) PONV and history of anesthetic complications  Airway Mallampati: II  TM Distance: >3 FB Neck ROM: Full    Dental  (+) Edentulous Upper, Edentulous Lower   Pulmonary COPD, Current Smoker,    Pulmonary exam normal        Cardiovascular hypertension, + CAD   Rhythm:Regular Rate:Normal     Neuro/Psych  Headaches, Seizures -,  PSYCHIATRIC DISORDERS Anxiety Depression Bipolar Disorder CVA    GI/Hepatic Neg liver ROS, GERD  ,  Endo/Other  negative endocrine ROS  Renal/GU negative Renal ROS  negative genitourinary   Musculoskeletal  (+) Arthritis ,   Abdominal Normal abdominal exam  (+)   Peds negative pediatric ROS (+)  Hematology negative hematology ROS (+)   Anesthesia Other Findings   Reproductive/Obstetrics negative OB ROS                            Anesthesia Physical Anesthesia Plan  ASA: 3  Anesthesia Plan: General and Regional   Post-op Pain Management:    Induction: Intravenous  PONV Risk Score and Plan: 2 and Treatment may vary due to age or medical condition, Midazolam, Ondansetron and Dexamethasone  Airway Management Planned: Oral ETT  Additional Equipment:   Intra-op Plan:   Post-operative Plan: Extubation in OR  Informed Consent: I have reviewed the patients History and Physical, chart, labs and discussed the procedure including the risks, benefits and alternatives for the proposed anesthesia with the patient or authorized representative who has indicated his/her understanding and acceptance.     Dental advisory given  Plan Discussed with: CRNA and Anesthesiologist  Anesthesia Plan Comments: (Interscalene block with exparel. GETA. Norton Blizzard, MD  Lab Results      Component                Value               Date                      WBC                      8.9                 08/27/2021                HGB                      14.2                08/27/2021                HCT                      40.6                08/27/2021                MCV                      90.8                08/27/2021  PLT                      245                 08/27/2021           Lab Results      Component                Value               Date                      NA                       139                 09/02/2021                K                        3.1 (L)             09/02/2021                CO2                      31                  09/02/2021                GLUCOSE                  92                  09/02/2021                BUN                      8                   09/02/2021                CREATININE               0.84                09/02/2021                CALCIUM                  9.3                 09/02/2021                GFRNONAA                 >60                 09/02/2021          )       Anesthesia Quick Evaluation

## 2021-12-04 NOTE — Discharge Instructions (Addendum)
Ophelia Charter MD, MPH Noemi Chapel, PA-C [place] [address], [address] [phone] (tel)   [phone] (fax)   POST-OPERATIVE INSTRUCTIONS - SHOULDER ARTHROSCOPY  WOUND CARE You may remove the Operative Dressing on Post-Op Day #3 (72hrs after surgery).   Alternatively if you would like you can leave dressing on until follow-up if within 7-8 days but keep it dry. Leave steri-strips in place until they fall off on their own, usually 2 weeks postop. There may be a small amount of fluid/bleeding leaking at the surgical site.  This is normal; the shoulder is filled with fluid during the procedure and can leak for 24-48hrs after surgery.  You may change/reinforce the bandage as needed.  Use the Cryocuff or Ice as often as possible for the first 7 days, then as needed for pain relief. Always keep a towel, ACE wrap or other barrier between the cooling unit and your skin.  You may shower on Post-Op Day #3. Gently pat the area dry. Do not soak the shoulder in water or submerge it. Keep incisions as dry as possible. Do not go swimming in the pool or ocean until 4 weeks after surgery or when otherwise instructed.    EXERCISES/BRACING Sling should be used at all times until follow-up.  You can remove sling for hygiene.    Please continue to ambulate and do not stay sitting or lying for too long. Perform foot and wrist pumps to assist in circulation.  POST-OP MEDICATIONS- Multimodal approach to pain control In general your pain will be controlled with a combination of substances.  Prescriptions unless otherwise discussed are electronically sent to your pharmacy.  This is a carefully made plan we use to minimize narcotic use.     Celebrex - Anti-inflammatory medication taken on a scheduled basis Acetaminophen - Non-narcotic pain medicine taken on a scheduled basis  Oxycodone - This is a strong narcotic, to be used only on an as needed basis for SEVERE pain. Zofran -  take as needed for nausea   FOLLOW-UP If you develop a Fever (?101.5), Redness or Drainage from the surgical incision site, please call our office to arrange for an evaluation. Please call the office to schedule a follow-up appointment for your first post-operative appointment, 10-14 days post-operatively.    HELPFUL INFORMATION  If you had a block, it will wear off between 8-24 hrs postop typically.  This is period when your pain may go from nearly zero to the pain you would have had postop without the block.  This is an abrupt transition but nothing dangerous is happening.  You may take an extra dose of narcotic when this happens.  You may be more comfortable sleeping in a semi-seated position the first few nights following surgery.  Keep a pillow propped under the elbow and forearm for comfort.  If you have a recliner type of chair it might be beneficial.  If not that is fine too, but it would be helpful to sleep propped up with pillows behind your operated shoulder as well under your elbow and forearm.  This will reduce pulling on the suture lines.  When dressing, put your operative arm in the sleeve first.  When getting undressed, take your operative arm out last.  Loose fitting, button-down shirts are recommended.  Often in the first days after surgery you may be more comfortable keeping your operative arm under your shirt and not through the sleeve.  You may return to work/school in the next couple  of days when you feel up to it.  Desk work and typing in the sling is fine.  We suggest you use the pain medication the first night prior to going to bed, in order to ease any pain when the anesthesia wears off. You should avoid taking pain medications on an empty stomach as it will make you nauseous.  You should wean off your narcotic medicines as soon as you are able.  Most patients will be off or using minimal narcotics before their first postop appointment.   Do not drink alcoholic  beverages or take illicit drugs when taking pain medications.  It is against the law to drive while taking narcotics.  In some states it is against the law to drive while your arm is in a sling.   Pain medication may make you constipated.  Below are a few solutions to try in this order: Decrease the amount of pain medication if you aren't having pain. Drink lots of decaffeinated fluids. Drink prune juice and/or eat dried prunes  If the first 3 don't work start with additional solutions Take Colace - an over-the-counter stool softener Take Senokot - an over-the-counter laxative Take Miralax - a stronger over-the-counter laxative  For more information including helpful videos and documents visit our website:   [email or web address]-[email or web address]   You may have Tylenol after 4:30pm today, if needed.  Post Anesthesia Home Care Instructions  Activity: Get plenty of rest for the remainder of the day. A responsible individual must stay with you for 24 hours following the procedure.  For the next 24 hours, DO NOT: -Drive a car -Paediatric nurse -Drink alcoholic beverages -Take any medication unless instructed by your physician -Make any legal decisions or sign important papers.  Meals: Start with liquid foods such as gelatin or soup. Progress to regular foods as tolerated. Avoid greasy, spicy, heavy foods. If nausea and/or vomiting occur, drink only clear liquids until the nausea and/or vomiting subsides. Call your physician if vomiting continues.  Special Instructions/Symptoms: Your throat may feel dry or sore from the anesthesia or the breathing tube placed in your throat during surgery. If this causes discomfort, gargle with warm salt water. The discomfort should disappear within 24 hours.  If you had a scopolamine patch placed behind your ear for the management of post- operative nausea and/or vomiting:  1. The medication in the patch is effective for 72 hours,  after which it should be removed.  Wrap patch in a tissue and discard in the trash. Wash hands thoroughly with soap and water. 2. You may remove the patch earlier than 72 hours if you experience unpleasant side effects which may include dry mouth, dizziness or visual disturbances. 3. Avoid touching the patch. Wash your hands with soap and water after contact with the patch.    Regional Anesthesia Blocks  1. Numbness or the inability to move the "blocked" extremity may last from 3-48 hours after placement. The length of time depends on the medication injected and your individual response to the medication. If the numbness is not going away after 48 hours, call your surgeon.  2. The extremity that is blocked will need to be protected until the numbness is gone and the  Strength has returned. Because you cannot feel it, you will need to take extra care to avoid injury. Because it may be weak, you may have difficulty moving it or using it. You may not know what position it is in without looking at it while the block is  in effect.  3. For blocks in the legs and feet, returning to weight bearing and walking needs to be done carefully. You will need to wait until the numbness is entirely gone and the strength has returned. You should be able to move your leg and foot normally before you try and bear weight or walk. You will need someone to be with you when you first try to ensure you do not fall and possibly risk injury.  4. Bruising and tenderness at the needle site are common side effects and will resolve in a few days.  5. Persistent numbness or new problems with movement should be communicated to the surgeon or the [place] [phone] [place] [phone]). Information for Discharge Teaching: EXPAREL (bupivacaine liposome injectable suspension)   Your surgeon or anesthesiologist gave you EXPAREL(bupivacaine) to help control your pain after surgery.  EXPAREL is a  local anesthetic that provides pain relief by numbing the tissue around the surgical site. EXPAREL is designed to release pain medication over time and can control pain for up to 72 hours. Depending on how you respond to EXPAREL, you may require less pain medication during your recovery.  Possible side effects: Temporary loss of sensation or ability to move in the area where bupivacaine was injected. Nausea, vomiting, constipation Rarely, numbness and tingling in your mouth or lips, lightheadedness, or anxiety may occur. Call your doctor right away if you think you may be experiencing any of these sensations, or if you have other questions regarding possible side effects.  Follow all other discharge instructions given to you by your surgeon or nurse. Eat a healthy diet and drink plenty of water or other fluids.  If you return to the hospital for any reason within 96 hours following the administration of EXPAREL, it is important for health care providers to know that you have received this anesthetic. A teal colored band has been placed on your arm with the date, time and amount of EXPAREL you have received in order to alert and inform your health care providers. Please leave this armband in place for the full 96 hours following administration, and then you may remove the band.

## 2021-12-04 NOTE — Op Note (Signed)
Orthopaedic Surgery Operative Note (CSN: [id number])  Spencer Mendoza  12/14/1960 Date of Surgery: 12/04/2021   Diagnoses:  Right shoulder, acute traumatic rotator cuff tear, SLAP tear, biceps tendinitis, AC arthritis, and subacromial impingement.  POST-OPERATIVE DIAGNOSIS: same  Procedure: Arthroscopic extensive debridement - 29823 Subdeltoid Bursa, Supraspinatus Tendon, Anterior Labrum, and bone Arthroscopic distal clavicle excision - 83151 Arthroscopic subacromial decompression - 76160 Arthroscopic rotator cuff repair - 73710 Arthroscopic biceps tenodesis - 62694 Arthroscopic subscapularis repair-29827   Operative Finding Exam under anesthesia: Full motion no limitation no instability Articular space: Capsule is intact but there is gross instability of the anterior and superior labrum constituting a type II versus type III tear.  The biceps itself was torn as well. Chondral surfaces: Normal Biceps: Significant fraying and tearing of the biceps. Subscapularis: Incomplete tear upper 30% of the tendon was torn with medialization of the medial glenohumeral ligament.  This was fixed with a single speed fix type construct with fiber tape and a 4.75 swivel lock Supraspinatus: Complete tear massive tear fixed with a 3 x 2 anchor configuration and medial tiedown sutures.  The tendon was quite mobile but the tissue quality was moderate to poor.  This looks like an acute versus acute on chronic tear. Infraspinatus: Complete tear massive tear as above  Successful completion of the planned procedure.  Patient's tendons were able to be repaired but due to his chronic medical issues as well as his history of narcotic abuse he has a high risk for failure as well as stiffness and pain issues.  We discussed at length with the patient as well as his family that he would not get a refill on pain medication as this was not indicated in the surgery.  He fails the surgery he would be candidate for reverse  total shoulder arthroplasty.  He is not a good candidate for superior capsular reconstruction.   Post-operative plan: The patient will be non-weightbearing in a sling for 6 weeks with therapy to start at 4 to 6 weeks.  The patient will be discharged home.  DVT prophylaxis not indicated in ambulatory upper extremity patient without known risk factors.   Pain control with PRN pain medication preferring oral medicines.  Follow up plan will be scheduled in approximately 7 days for incision check and XR.  Post-Op Diagnosis: Same Surgeons:Primary: Hiram Gash, MD Assistants:Caroline McBane PA-C Location: [facility] OR ROOM 6 Anesthesia: General with Exparel interscalene block Antibiotics: Ancef 2 g Tourniquet time: None Estimated Blood Loss: Minimal Complications: None Specimens: None Implants: Implant Name Type Inv. Item Serial No. Manufacturer Lot No. LRB No. Used Action  ANCHOR FIBERTAK RC 2.6 (BLUE) - [id number] Anchor ANCHOR FIBERTAK RC 2.6 (BLUE)  [employer] [id number] Right 1 Implanted  ANCHOR SUT FBRTK 2.6X1.7X2 - [id number] Anchor ANCHOR SUT FBRTK 2.6X1.7X2  [employer] [id number] Right 1 Implanted  ANCHOR SUT FBRTK 2.6X1.7X2 - [id number] Anchor ANCHOR SUT FBRTK 2.6X1.7X2  [employer] [id number] Right 1 Implanted  ANCHOR SUT BIO SW 4.75X19.1 - [id number] Anchor ANCHOR SUT BIO SW 4.75X19.1  [employer] [id number] Right 1 Implanted  ANCHOR SUT BIO SW 4.75X19.1 - [id number] Anchor ANCHOR SUT BIO SW 4.75X19.1  [employer] [id number] Right 1 Implanted  ANCHOR SUT 1.8 FIBERTAK SB KL - [id number] Anchor ANCHOR SUT 1.8 FIBERTAK SB KL  [employer] [id number] Right 1 Implanted  ANCHOR SUT 1.8 FIBERTAK SB KL - [id number] Anchor ANCHOR SUT 1.8 FIBERTAK SB KL  [employer] [id number] Right 1 Implanted  ANCHOR SUT BIO SW 4.75X19.1 -  [id number] Anchor ANCHOR SUT BIO SW 4.75X19.1  Rolinda Roan [id number] Right 1 Implanted    Indications for Surgery:   Spencer Mendoza is a 61 y.o. male with continued shoulder pain refractory to  nonoperative measures for extended period of time.  He had a fall that was worrisome for an acute traumatic rotator cuff tear.  Unfortunately the patient's medical problems precluded a complete MRI and we had to use the MRI that was incomplete to make medical decisions.  Additionally the patient's history of narcotic abuse as well as self-harm complicated his case.  The risks and benefits were explained at length including but not limited to continued pain, cuff failure, biceps tenodesis failure, stiffness, need for further surgery and infection.   Procedure:   Patient was correctly identified in the preoperative holding area and operative site marked.  Patient brought to OR and positioned beachchair on an [place] table ensuring that all bony prominences were padded and the head was in an appropriate location.  Anesthesia was induced and the operative shoulder was prepped and draped in the usual sterile fashion.  Timeout was called preincision.  A standard posterior viewing portal was made after localizing the portal with a spinal needle.  An anterior accessory portal was also made.  After clearing the articular space the camera was positioned in the subacromial space.  Findings above.    Extensive debridement was performed of the anterior interval tissue, labral fraying and the bursa.  Subscapularis Repair: We identified a subscapularis tear that involved the upper 30% of the tendon.  Using a grasping device were able to demonstrate that the tendon could be reapproximated to the lesser tuberosity.  We felt that it was repairable.  We then used a RF ablator to open the rotator interval and released the MGHL to allow further translation of the subscapularis.  This point we cleared both anterior and posterior to the tendon taking care to avoid inferior migration to avoid the neurovascular structures as well as the muscular cutaneous nerve.  We then used a lasso to pass a fiber tape in a mattress fashion  through the subscapularis and based 4.75 swivel lock was used to repair back to the lesser tuberosity after prepping the tuberosity extensively.  We had good approximation of the tendon and a recreation of the rolled border.   Subacromial decompression: We made a lateral portal with spinal needle guidance. We then proceeded to debride bursal tissue extensively with a shaver and arthrocare device. At that point we continued to identify the borders of the acromion and identify the spur. We then carefully preserved the deltoid fascia and used a burr to convert the acromion to a Type 1 flat acromion without issue.  Biceps tenodesis: We marked the tendon and then performed a tenotomy and debridement of the stump in the articular space. We then identified the biceps tendon in its groove suprapec with the arthroscope in the lateral portal taking care to move from lateral to medial to avoid injury to the subscapularis. At that point we unroofed the tendon itself and mobilized it. An accessory anterior portal was made in line with the tendon and we grasped it from the anterior superior portal and worked from the accessory anterior portal. Two Fibertak 1.87mm knotless anchors were placed in the groove and the tendon was secured in a luggage loop style fashion with a pass of the limb of suture through the tendon using a scorpion device to avoid pull-through.  Repair was completed with good  tension on the tendon.  Residual stump of the tendon was removed after being resected with a RF ablator.  Distal Clavicle resection:  The scope was placed in the subacromial space from the posterior portal.  A hemostat was placed through the anterior portal and we spread at the [facility] joint.  A burr was then inserted and 10 mm of distal clavicle was resected taking care to avoid damage to the capsule around the joint and avoiding overhanging bone posteriorly.    Superior rotator cuff repair: After the above we prepared the tuberosity for  healing by cleaning soft tissue away and using a bur to create a bleeding surface.  That point were able to mobilize the rotator cuff tissue itself and noted a massive tear of the supra and infraspinatus.  The tendon was mobile and was able to be mobilized to the tuberosity.  We placed 3-2.6 Arthrex fiber tack anchors loaded with a knotless mechanism as well as fiber tape.  These were placed in typical fashion at the articular margin.  We then shuttled each of the sutures through the rotator cuff using a scorpion and obtain good purchase.  Were able to reduce the tendon holding it reduced with an instrument and used the knotless mechanism sutures to interlock and create a medial tiedown and compression.  This held the tendon reduced and gave [place] backup fixation.  We then used our fiber tape sutures and placed 3 into 2 different 4.75 bio composite swivel locks placed 8 to 10 mm over the lateral border of the tuberosity.  We had good fixation with these.  We had robust compression of the tendon to bone.  The incisions were closed with absorbable monocryl and steri strips.  A sterile dressing was placed along with a sling. The patient was awoken from general anesthesia and taken to the PACU in stable condition without complication.   Noemi Chapel, PA-C, present and scrubbed throughout the case, critical for completion in a timely fashion, and for retraction, instrumentation, closure.

## 2021-12-04 NOTE — Interval H&P Note (Signed)
I had a long discussion with the patient as well as his sister.  Due to the patient's history of narcotic abuse and chronic use he is extraordinarily high risk after surgery of having a relapse on this.  That said the patient and the family are understanding of using controlled narcotics.  He will receive 30 pills of 5 mg oxycodone tablets and we reported that under no circumstances would we refill any narcotics.  The surgery is often performed without the need for narcotics alone.  We will use nonnarcotic medicines to try and control his pain.  We gave the patient an opportunity to cancel the surgery if he disagreed with this plan and he elected to proceed.  All questions were answered.  If the patient has a rotator cuff tear that is not repairable we will plan on doing a reverse total shoulder arthroplasty later date.

## 2021-12-04 NOTE — Anesthesia Procedure Notes (Signed)
Procedure Name: Intubation Date/Time: 12/04/2021 12:45 PM Performed by: Lavonia Dana, CRNA Pre-anesthesia Checklist: Patient identified, Emergency Drugs available, Suction available and Patient being monitored Patient Re-evaluated:Patient Re-evaluated prior to induction Oxygen Delivery Method: Circle system utilized Preoxygenation: Pre-oxygenation with 100% oxygen Induction Type: IV induction Ventilation: Oral airway inserted - appropriate to patient size and Two handed mask ventilation required Laryngoscope Size: Mac and 4 Grade View: Grade I Tube type: Oral Tube size: 7.5 mm Number of attempts: 1 Airway Equipment and Method: Stylet, Oral airway and Bite block Placement Confirmation: ETT inserted through vocal cords under direct vision, positive ETCO2 and breath sounds checked- equal and bilateral Secured at: 23 cm Tube secured with: Tape Dental Injury: Teeth and Oropharynx as per pre-operative assessment

## 2021-12-04 NOTE — Anesthesia Procedure Notes (Signed)
Anesthesia Regional Block: Interscalene brachial plexus block   Pre-Anesthetic Checklist: , timeout performed,  Correct Patient, Correct Site, Correct Laterality,  Correct Procedure, Correct Position, site marked,  Risks and benefits discussed,  Surgical consent,  Pre-op evaluation,  At surgeon's request and post-op pain management  Laterality: Right  Prep: Dura Prep       Needles:  Injection technique: Single-shot  Needle Type: Echogenic Stimulator Needle     Needle Length: 5cm  Needle Gauge: 20     Additional Needles:   Procedures:,,,, ultrasound used (permanent image in chart),,    Narrative:  Start time: 12/04/2021 11:15 AM End time: 12/04/2021 11:20 AM Injection made incrementally with aspirations every 5 mL.  Performed by: Personally  Anesthesiologist: Darral Dash, DO  Additional Notes: Patient identified. Risks/Benefits/Options discussed with patient including but not limited to bleeding, infection, nerve damage, failed block, incomplete pain control. Patient expressed understanding and wished to proceed. All questions were answered. Sterile technique was used throughout the entire procedure. Please see nursing notes for vital signs. Aspirated in 5cc intervals with injection for negative confirmation. Patient was given instructions on fall risk and not to get out of bed. All questions and concerns addressed with instructions to call with any issues or inadequate analgesia.

## 2021-12-04 NOTE — Transfer of Care (Signed)
Immediate Anesthesia Transfer of Care Note  Patient: Spencer Mendoza  Procedure(s) Performed: SHOULDER ARTHROSCOPY WITH SUBACROMIAL DECOMPRESSION, ROTATOR CUFF REPAIR AND BICEP TENDON REPAIR (Right: Shoulder) SHOULDER ARTHROSCOPY WITH DISTAL CLAVICLE EXCISION (Right: Shoulder)  Patient Location: PACU  Anesthesia Type:General  Level of Consciousness: awake, alert  and oriented  Airway & Oxygen Therapy: Patient Spontanous Breathing and Patient connected to face mask oxygen  Post-op Assessment: Report given to RN and Post -op Vital signs reviewed and stable  Post vital signs: Reviewed and stable  Last Vitals:  Vitals Value Taken Time  BP 141/84 12/04/21 1423  Temp    Pulse 63 12/04/21 1427  Resp 17 12/04/21 1427  SpO2 93 % 12/04/21 1427  Vitals shown include unvalidated device data.  Last Pain:  Vitals:   12/04/21 1025  TempSrc: Oral  PainSc: 8       Patients Stated Pain Goal: 5 (11/65/79 0383)  Complications: No notable events documented.

## 2021-12-04 NOTE — Progress Notes (Signed)
Assisted Dr. Jana Half with right, ultrasound guided, interscalene  block. Side rails up, monitors on throughout procedure. See vital signs in flow sheet. Tolerated Procedure well.

## 2021-12-04 NOTE — Anesthesia Postprocedure Evaluation (Signed)
Anesthesia Post Note  Patient: Spencer Mendoza  Procedure(s) Performed: SHOULDER ARTHROSCOPY WITH SUBACROMIAL DECOMPRESSION, ROTATOR CUFF REPAIR AND BICEP TENDON REPAIR (Right: Shoulder) SHOULDER ARTHROSCOPY WITH DISTAL CLAVICLE EXCISION (Right: Shoulder)     Patient location during evaluation: PACU Anesthesia Type: Regional and General Level of consciousness: awake and alert Pain management: pain level controlled Vital Signs Assessment: post-procedure vital signs reviewed and stable Respiratory status: spontaneous breathing, nonlabored ventilation, respiratory function stable and patient connected to nasal cannula oxygen Cardiovascular status: blood pressure returned to baseline and stable Postop Assessment: no apparent nausea or vomiting Anesthetic complications: no   No notable events documented.  Last Vitals:  Vitals:   12/04/21 1530 12/04/21 1540  BP: (!) 138/97 (!) 136/93  Pulse: 73 68  Resp: 17 13  Temp:  36.5 C  SpO2: 92% 94%    Last Pain:  Vitals:   12/04/21 1025  TempSrc: Oral  PainSc: 8                  Ben Sanz P Erskine Steinfeldt

## 2021-12-05 NOTE — Progress Notes (Signed)
Left message stating courtesy call and if any questions or concerns please call the doctors office.  

## 2021-12-06 ENCOUNTER — Encounter (HOSPITAL_BASED_OUTPATIENT_CLINIC_OR_DEPARTMENT_OTHER): Payer: Self-pay | Admitting: Orthopaedic Surgery

## 2021-12-10 ENCOUNTER — Ambulatory Visit: Payer: Medicare Other | Attending: Nurse Practitioner | Admitting: Nurse Practitioner

## 2021-12-10 ENCOUNTER — Encounter: Payer: Self-pay | Admitting: Nurse Practitioner

## 2021-12-10 DIAGNOSIS — M25611 Stiffness of right shoulder, not elsewhere classified: Secondary | ICD-10-CM | POA: Diagnosis not present

## 2021-12-10 DIAGNOSIS — M25511 Pain in right shoulder: Secondary | ICD-10-CM | POA: Diagnosis not present

## 2021-12-10 DIAGNOSIS — F5104 Psychophysiologic insomnia: Secondary | ICD-10-CM

## 2021-12-10 DIAGNOSIS — S46011D Strain of muscle(s) and tendon(s) of the rotator cuff of right shoulder, subsequent encounter: Secondary | ICD-10-CM | POA: Diagnosis not present

## 2021-12-10 DIAGNOSIS — M6281 Muscle weakness (generalized): Secondary | ICD-10-CM | POA: Diagnosis not present

## 2021-12-10 MED ORDER — MIRTAZAPINE 15 MG PO TABS: ORAL_TABLET | ORAL | 0 refills | Status: DC

## 2021-12-10 NOTE — Progress Notes (Signed)
Virtual Visit via Telephone Note Due to national recommendations of social distancing due to [place] 19, telehealth visit is felt to be most appropriate for this patient at this time.  I discussed the limitations, risks, security and privacy concerns of performing an evaluation and management service by telephone and the availability of in person appointments. I also discussed with the patient that there may be a patient responsible charge related to this service. The patient expressed understanding and agreed to proceed.    I connected with Spencer Mendoza on 12/10/21  at   1:30 PM EST  EDT by telephone and verified that I am speaking with the correct person using two identifiers.  Location of Patient: Private Residence   Location of Provider: [facility] [employer] Office    Persons participating in Telemedicine visit: Geryl Rankins FNP-BC [place]    History of Present Illness: Telemedicine visit for: Medication Refill He has a history of major depressive disorder, panic disorder with agoraphobia and adjustment disorder with mixed anxiety and depressed mood.  He recently established with a psychiatrist on September 13, 2021 and today states that he is no longer seeing that psychiatrist and needs a refill of his Remeron.  States he has a history of insomnia which if not controlled will cause seizures.  I have instructed him that I will do a courtesy refill of Remeron for 30 days only.  He needs to follow-up with his PCP or his psychiatrist for additional refills as he has a history of QT prolongation and Remeron is a medication that can increase this.  He was previously taking trazodone for insomnia however this medication was discontinued after he was admitted to behavioral health [facility] last October for suicide attempt via overdose on trazodone.   He also needs a follow-up with his PCP for hypertension.  Past Medical History:  Diagnosis Date   Alcohol withdrawal  ([place]) 07/29/2013   Allergy    Anxiety    Arthritis    Benzodiazepine abuse ([place]) 08/30/2019   Bipolar disorder ([place])    CAD (coronary artery disease)    Chronic insomnia 06/06/2015   Chronic migraine without aura, with intractable migraine, so stated, with status migrainosus 08/25/2019   Claustrophobia    Cocaine abuse ([place]) 05/22/2015   COPD (chronic obstructive pulmonary disease) ([place])    Delirium tremens ([place]) 07/29/2013   Depression    Epilepsy ([place])    last seizure week of 06-25-2020- sev. grand mal seizures per pt    GERD (gastroesophageal reflux disease)    Hemiplegic migraine with status migrainosus 06/06/2015   Hypertension    Migraine    Opioid use disorder 08/30/2019   PONV (postoperative nausea and vomiting)    Stroke [facility])    patient denies- states had a hemiplegic migraine in ~2011   Substance abuse ([place])     Past Surgical History:  Procedure Laterality Date   ANKLE SURGERY     in high school   APPENDECTOMY     COLONOSCOPY     KNEE ARTHROSCOPY Right    NOSE SURGERY     SHOULDER ARTHROSCOPY WITH DISTAL CLAVICLE RESECTION Right 12/04/2021   Procedure: SHOULDER ARTHROSCOPY WITH DISTAL CLAVICLE EXCISION;  Surgeon: Hiram Gash, MD;  Location: [facility];  Service: Orthopedics;  Laterality: Right;   SHOULDER ARTHROSCOPY WITH SUBACROMIAL DECOMPRESSION, ROTATOR CUFF REPAIR AND BICEP TENDON REPAIR Right 12/04/2021   Procedure: SHOULDER ARTHROSCOPY WITH SUBACROMIAL DECOMPRESSION, ROTATOR CUFF REPAIR AND BICEP TENDON REPAIR;  Surgeon:  Hiram Gash, MD;  Location: [facility];  Service: Orthopedics;  Laterality: Right;    Family History  Problem Relation Age of Onset   Heart disease Mother    Stroke Mother    Epilepsy Mother    Heart failure Mother    Cancer Father    Heart disease Father    Hypertension Father    Melanoma Father    Migraines Father    Colon cancer Sister 26   Colon cancer Cousin    Cancer Cousin    Colon polyps Neg  Hx    Esophageal cancer Neg Hx    Rectal cancer Neg Hx    Stomach cancer Neg Hx     Social History   Socioeconomic History   Marital status: Legally Separated    Spouse name: Not on file   Number of children: 0   Years of education: GED   Highest education level: Not on file  Occupational History   Occupation: disabled  Tobacco Use   Smoking status: Every Day    Packs/day: 0.25    Years: 15.00    Pack years: 3.75    Types: Cigarettes   Smokeless tobacco: Never  Vaping Use   Vaping Use: Never used  Substance and Sexual Activity   Alcohol use: Not Currently    Alcohol/week: 6.0 standard drinks    Types: 6 Cans of beer per week    Comment: none since Sept 2020   Drug use: Yes    Types: Marijuana    Comment: last smoked 5d ago, 11-27-21   Sexual activity: Yes    Birth control/protection: Condom  Other Topics Concern   Not on file  Social History Narrative   Pt lives with sister.   Patient drinks 2 cups of caffeine daily.   Patient is right handed.   Social Determinants of Health   Financial Resource Strain: Not on file  Food Insecurity: Not on file  Transportation Needs: Not on file  Physical Activity: Not on file  Stress: Not on file  Social Connections: Not on file     Observations/Objective: Awake, alert and oriented x 3   Review of Systems  Constitutional:  Negative for fever, malaise/fatigue and weight loss.  HENT: Negative.  Negative for nosebleeds.   Eyes: Negative.  Negative for blurred vision, double vision and photophobia.  Respiratory: Negative.  Negative for cough and shortness of breath.   Cardiovascular: Negative.  Negative for chest pain, palpitations and leg swelling.  Gastrointestinal:  Negative for heartburn, nausea and vomiting. Blood in stool: tele. Musculoskeletal: Negative.  Negative for myalgias.  Neurological: Negative.  Negative for dizziness, focal weakness, seizures and headaches.  Psychiatric/Behavioral:  Positive for depression.  Negative for suicidal ideas. The patient is nervous/anxious and has insomnia.    Assessment and Plan: Diagnoses and all orders for this visit:  Chronic insomnia -     mirtazapine (REMERON) 15 MG tablet; TAKE 1 TABLET(15 MG) BY MOUTH AT BEDTIME. NEEDS F/U with PCP for any additional refills. Needs NEW EKG     Follow Up Instructions Return for HTN.  Patient  instructed that he needs to follow-up with PCP as soon as possible for BP check/EKG.     I discussed the assessment and treatment plan with the patient. The patient was provided an opportunity to ask questions and all were answered. The patient agreed with the plan and demonstrated an understanding of the instructions.   The patient was advised to call back or  seek an in-person evaluation if the symptoms worsen or if the condition fails to improve as anticipated.  I provided 13 minutes of non-face-to-face time during this encounter including median intraservice time, reviewing previous notes, labs, imaging, medications and explaining diagnosis and management.  Gildardo Pounds, FNP-BC

## 2021-12-11 DIAGNOSIS — M25511 Pain in right shoulder: Secondary | ICD-10-CM | POA: Diagnosis not present

## 2021-12-12 DIAGNOSIS — M25511 Pain in right shoulder: Secondary | ICD-10-CM | POA: Diagnosis not present

## 2022-01-05 ENCOUNTER — Other Ambulatory Visit: Payer: Self-pay | Admitting: Nurse Practitioner

## 2022-01-05 DIAGNOSIS — F5104 Psychophysiologic insomnia: Secondary | ICD-10-CM

## 2022-01-06 NOTE — Telephone Encounter (Signed)
Requested medication (s) are due for refill today:   Provider to review  Requested medication (s) are on the active medication list:   Yes  Future visit scheduled:   No  Did a tele visit with Geryl Rankins In Jan. 2023.   Last ordered: 12/10/2021 #30, 0 refills  Returned because protocol criteria met however there is a note he needs an EKG.  He has multiple No Shows.  Not sure where this  EKG is to be done.   Requested Prescriptions  Pending Prescriptions Disp Refills   mirtazapine (REMERON) 15 MG tablet [Pharmacy Med Name: MIRTAZAPINE 15MG TABLETS] 30 tablet 0    Sig: TAKE 1 TABLET(15 MG) BY MOUTH AT BEDTIME. FOLLOW UP WITH PCP FOR ANY ADDITIONAL REFILLS     Psychiatry: Antidepressants - mirtazapine Passed - 01/05/2022 10:08 AM      Passed - Completed PHQ-2 or PHQ-9 in the last 360 days      Passed - Valid encounter within last 6 months    Recent Outpatient Visits           3 weeks ago Chronic insomnia   [place] [facility], Vernia Buff, NP   9 months ago Exanthem due to herpes zoster   [place] Elsie Stain, MD   9 months ago MDD (major depressive disorder), recurrent severe, without psychosis [facility])   [facility] Elsie Stain, MD   1 year ago COPD with chronic bronchitis [facility]   [facility] Elsie Stain, MD   1 year ago Essential hypertension   [place] Elsie Stain, MD

## 2022-01-13 ENCOUNTER — Ambulatory Visit: Payer: Self-pay

## 2022-01-13 DIAGNOSIS — F5104 Psychophysiologic insomnia: Secondary | ICD-10-CM

## 2022-01-13 MED ORDER — MIRTAZAPINE 15 MG PO TABS: ORAL_TABLET | ORAL | 0 refills | Status: DC

## 2022-01-13 NOTE — Telephone Encounter (Signed)
Pt called saying he needs to see Dr. Joya Gaskins for his sleep disorder.  He said he is out of his sleep medication and he is not seeing his psy doctor anymore.  The first appt for Dr. Joya Gaskins is out to May.  The first appt with anyone else is March 23.   There was an appt today but he cannot come because he has an appt for a FU with his shoulder surgeon.   Voice mailbox not set up, unable to leave message.

## 2022-01-13 NOTE — Telephone Encounter (Signed)
° °  Chief Complaint: Needs medication - Remeron 15 mg Symptoms: Cannot sleep  just had Sx Frequency: NA Pertinent Negatives: Patient denies NA Disposition: [] ED /[] Urgent Care (no appt availability in office) / [] Appointment(In office/virtual)/ []  [facility]/ [] Home Care/ [] Refused Recommended Disposition /[] [facility]/ [x]  Follow-up with PCP  Additional Notes: PT already had courtesy refill for this medication. Pt states that he needs this or a different medication for sleep. Hx of sleep disorder. Pt no longer seeing psychiatrist.      Pt called saying he needs to see Dr. Joya Gaskins for his sleep disorder.  He said he is out of his sleep medication and he is not seeing his psy doctor anymore.  The first appt for Dr. Joya Gaskins is out to May.  The first appt with anyone else is March 23.   There was an appt today but he cannot come because he has an appt for a FU with his shoulder surgeon.   CB#  [phone]  Reason for Disposition  [1] Prescription refill request for NON-ESSENTIAL medicine (i.e., no harm to patient if med not taken) AND [2] triager unable to refill per department policy  Answer Assessment - Initial Assessment Questions 1. DRUG NAME: "What medicine do you need to have refilled?"     Remeron  15mg  2. REFILLS REMAINING: "How many refills are remaining?" (Note: The label on the medicine or pill bottle will show how many refills are remaining. If there are no refills remaining, then a renewal may be needed.)     0 3. EXPIRATION DATE: "What is the expiration date?" (Note: The label states when the prescription will expire, and thus can no longer be refilled.)     NA 4. PRESCRIBING HCP: "Who prescribed it?" Reason: If prescribed by specialist, call should be referred to that group.     Psychiatrist 5. SYMPTOMS: "Do you have any symptoms?"     Cannot sleep - Just had surgery 6. PREGNANCY: "Is there any chance that you are pregnant?" "When was your last  menstrual period?"     na  Protocols used: Medication Refill and Renewal Call-A-AH

## 2022-01-13 NOTE — Telephone Encounter (Signed)
Remeron refill sent  Carly try to find a march spot for this patient, he may have to come to mobile unit the week I am on the unit in march

## 2022-01-14 NOTE — Telephone Encounter (Signed)
Pt is aware and appointment was made

## 2022-01-17 DIAGNOSIS — M25511 Pain in right shoulder: Secondary | ICD-10-CM | POA: Diagnosis not present

## 2022-01-22 DIAGNOSIS — M6281 Muscle weakness (generalized): Secondary | ICD-10-CM | POA: Diagnosis not present

## 2022-01-22 DIAGNOSIS — M25611 Stiffness of right shoulder, not elsewhere classified: Secondary | ICD-10-CM | POA: Diagnosis not present

## 2022-01-22 DIAGNOSIS — M25511 Pain in right shoulder: Secondary | ICD-10-CM | POA: Diagnosis not present

## 2022-01-22 DIAGNOSIS — S46011D Strain of muscle(s) and tendon(s) of the rotator cuff of right shoulder, subsequent encounter: Secondary | ICD-10-CM | POA: Diagnosis not present

## 2022-01-28 DIAGNOSIS — M25511 Pain in right shoulder: Secondary | ICD-10-CM | POA: Diagnosis not present

## 2022-01-28 DIAGNOSIS — M6281 Muscle weakness (generalized): Secondary | ICD-10-CM | POA: Diagnosis not present

## 2022-01-28 DIAGNOSIS — S46011D Strain of muscle(s) and tendon(s) of the rotator cuff of right shoulder, subsequent encounter: Secondary | ICD-10-CM | POA: Diagnosis not present

## 2022-01-28 DIAGNOSIS — M25611 Stiffness of right shoulder, not elsewhere classified: Secondary | ICD-10-CM | POA: Diagnosis not present

## 2022-01-30 DIAGNOSIS — S46011D Strain of muscle(s) and tendon(s) of the rotator cuff of right shoulder, subsequent encounter: Secondary | ICD-10-CM | POA: Diagnosis not present

## 2022-01-30 DIAGNOSIS — M6281 Muscle weakness (generalized): Secondary | ICD-10-CM | POA: Diagnosis not present

## 2022-01-30 DIAGNOSIS — M25511 Pain in right shoulder: Secondary | ICD-10-CM | POA: Diagnosis not present

## 2022-01-30 DIAGNOSIS — M25611 Stiffness of right shoulder, not elsewhere classified: Secondary | ICD-10-CM | POA: Diagnosis not present

## 2022-02-02 NOTE — Progress Notes (Signed)
? ?Established Patient Office Visit ? ?Subjective:  ?Patient ID: Spencer Mendoza, male    DOB: 08/16/1961  Age: 61 y.o. MRN: [id number] ? ?CC: No chief complaint on file. ? ? ?HPI ?Spencer Mendoza presents for PCP visit ? ?Patient wanting refills on xanax  He discharged his mental health MD   Pt getting valium he is purchasing from another individual on the street.  He was told not to do so and he continues to purchase medications ? ?When I told the patient I could not condone this behavior and he would need to stop and cease this, he determined he did not want to proceed with further visit and got up and walked out of the clinic ? ?I will no longer provide service for this patient  ? ? ?Past Medical History:  ?Diagnosis Date  ? Alcohol withdrawal ([place]) 07/29/2013  ? Allergy   ? Anxiety   ? Arthritis   ? Benzodiazepine abuse ([place]) 08/30/2019  ? Bipolar disorder ([place])   ? CAD (coronary artery disease)   ? Chronic insomnia 06/06/2015  ? Chronic migraine without aura, with intractable migraine, so stated, with status migrainosus 08/25/2019  ? Claustrophobia   ? Cocaine abuse ([place]) 05/22/2015  ? COPD (chronic obstructive pulmonary disease) ([place])   ? Delirium tremens ([place]) 07/29/2013  ? Depression   ? Epilepsy ([place])   ? last seizure week of 06-25-2020- sev. grand mal seizures per pt   ? GERD (gastroesophageal reflux disease)   ? Hemiplegic migraine with status migrainosus 06/06/2015  ? Hypertension   ? Migraine   ? Opioid use disorder 08/30/2019  ? PONV (postoperative nausea and vomiting)   ? Stroke [facility])   ? patient denies- states had a hemiplegic migraine in ~2011  ? Substance abuse ([place])   ? ? ?Past Surgical History:  ?Procedure Laterality Date  ? ANKLE SURGERY    ? in high school  ? APPENDECTOMY    ? COLONOSCOPY    ? KNEE ARTHROSCOPY Right   ? NOSE SURGERY    ? SHOULDER ARTHROSCOPY WITH DISTAL CLAVICLE RESECTION Right 12/04/2021  ? Procedure: SHOULDER ARTHROSCOPY WITH DISTAL CLAVICLE EXCISION;  Surgeon: Hiram Gash, MD;  Location: [place];  Service: Orthopedics;  Laterality: Right;  ? SHOULDER ARTHROSCOPY WITH SUBACROMIAL DECOMPRESSION, ROTATOR CUFF REPAIR AND BICEP TENDON REPAIR Right 12/04/2021  ? Procedure: SHOULDER ARTHROSCOPY WITH SUBACROMIAL DECOMPRESSION, ROTATOR CUFF REPAIR AND BICEP TENDON REPAIR;  Surgeon: Hiram Gash, MD;  Location: [place];  Service: Orthopedics;  Laterality: Right;  ? ? ?Family History  ?Problem Relation Age of Onset  ? Heart disease Mother   ? Stroke Mother   ? Epilepsy Mother   ? Heart failure Mother   ? Cancer Father   ? Heart disease Father   ? Hypertension Father   ? Melanoma Father   ? Migraines Father   ? Colon cancer Sister 30  ? Colon cancer Cousin   ? Cancer Cousin   ? Colon polyps Neg Hx   ? Esophageal cancer Neg Hx   ? Rectal cancer Neg Hx   ? Stomach cancer Neg Hx   ? ? ?Social History  ? ?Socioeconomic History  ? Marital status: Legally Separated  ?  Spouse name: Not on file  ? Number of children: 0  ? Years of education: GED  ? Highest education level: Not on file  ?Occupational History  ? Occupation: disabled  ?Tobacco Use  ? Smoking status: Every Day  ?  Packs/day: 0.25  ?  Years: 15.00  ?  Pack years: 3.75  ?  Types: Cigarettes  ? Smokeless tobacco: Never  ?Vaping Use  ? Vaping Use: Never used  ?Substance and Sexual Activity  ? Alcohol use: Not Currently  ?  Alcohol/week: 6.0 standard drinks  ?  Types: 6 Cans of beer per week  ?  Comment: none since Sept 2020  ? Drug use: Yes  ?  Types: Marijuana  ?  Comment: last smoked 5d ago, 11-27-21  ? Sexual activity: Yes  ?  Birth control/protection: Condom  ?Other Topics Concern  ? Not on file  ?Social History Narrative  ? Pt lives with sister.  ? Patient drinks 2 cups of caffeine daily.  ? Patient is right handed.  ? ?Social Determinants of Health  ? ?Financial Resource Strain: Not on file  ?Food Insecurity: Not on file  ?Transportation Needs: Not on file  ?Physical Activity: Not on file  ?Stress:  Not on file  ?Social Connections: Not on file  ?Intimate Partner Violence: Not on file  ? ? ?Outpatient Medications Prior to Visit  ?Medication Sig Dispense Refill  ? albuterol (VENTOLIN HFA) 108 (90 Base) MCG/ACT inhaler INHALE 2 PUFFS BY MOUTH INTO THE LUNGS EVERY 4 HOURS AS NEEDED FOR SHORTNESS OF BREATH OR WHEEZING 8.5 g 3  ? ALPRAZolam (XANAX) 0.25 MG tablet Take 0.25 mg by mouth 2 (two) times daily as needed for anxiety.    ? aspirin EC 81 MG tablet Take 1 tablet (81 mg total) by mouth daily. Swallow whole. 30 tablet 11  ? budesonide-formoterol (SYMBICORT) 160-4.5 MCG/ACT inhaler Inhale 2 puffs into the lungs 2 (two) times daily. 10.2 g 5  ? busPIRone (BUSPAR) 10 MG tablet TAKE 1 TABLET(10 MG) BY MOUTH TWICE DAILY 60 tablet 0  ? levETIRAcetam (KEPPRA) 1000 MG tablet Take 1 tablet (1,000 mg total) by mouth 2 (two) times daily. 60 tablet 11  ? meclizine (ANTIVERT) 25 MG tablet Take 1 tablet (25 mg total) by mouth every 8 (eight) hours as needed for nausea. 10 tablet 0  ? mirtazapine (REMERON) 15 MG tablet TAKE 1 TABLET(15 MG) BY MOUTH AT BEDTIME. NEEDS F/U with PCP for any additional refills. Needs NEW EKG 30 tablet 0  ? rosuvastatin (CRESTOR) 10 MG tablet Take 1 tablet (10 mg total) by mouth daily. 90 tablet 3  ? thiamine 100 MG tablet Take 1 tablet (100 mg total) by mouth daily. 30 tablet 3  ? venlafaxine XR (EFFEXOR-XR) 75 MG 24 hr capsule TAKE 1 CAPSULE(75 MG) BY MOUTH DAILY WITH BREAKFAST 30 capsule 0  ? ?No facility-administered medications prior to visit.  ? ? ?Allergies  ?Allergen Reactions  ? Sertraline Other (See Comments)  ?  Erectile dysfunction  ? Codeine Nausea And Vomiting  ? Depakote [Divalproex Sodium] Other (See Comments)  ?  Made patient shake  ? Migranal [Dihydroergotamine] Nausea And Vomiting  ? Topamax [Topiramate] Other (See Comments)  ?  Made patient angry  ? ? ?ROS ?Review of Systems ? ?  ?Objective:  ?  ?Physical Exam ?Did not stay of the exam ? ?There were no vitals taken for this  visit. ?Wt Readings from Last 3 Encounters:  ?12/04/21 158 lb 8.2 oz (71.9 kg)  ?06/26/21 156 lb (70.8 kg)  ?06/25/21 156 lb (70.8 kg)  ? ? ? ?Health Maintenance Due  ?Topic Date Due  ? TETANUS/TDAP  Never done  ? Zoster Vaccines- Shingrix (1 of 2) Never done  ? INFLUENZA VACCINE  06/17/2021  ? ? ?There are no preventive care reminders to display for this patient. ? ?Lab Results  ?Component Value Date  ? TSH 1.898 08/28/2021  ? ?Lab Results  ?Component Value Date  ? WBC 8.9 08/27/2021  ? HGB 14.2 08/27/2021  ? HCT 40.6 08/27/2021  ? MCV 90.8 08/27/2021  ? PLT 245 08/27/2021  ? ?Lab Results  ?Component Value Date  ? NA 139 09/02/2021  ? K 3.1 (L) 09/02/2021  ? CO2 31 09/02/2021  ? GLUCOSE 92 09/02/2021  ? BUN 8 09/02/2021  ? CREATININE 0.84 09/02/2021  ? BILITOT 1.2 08/27/2021  ? ALKPHOS 81 08/27/2021  ? AST 34 08/27/2021  ? ALT 26 08/27/2021  ? PROT 5.9 (L) 08/27/2021  ? ALBUMIN 3.7 08/27/2021  ? CALCIUM 9.3 09/02/2021  ? ANIONGAP 5 09/02/2021  ? ?Lab Results  ?Component Value Date  ? CHOL 142 08/28/2021  ? ?Lab Results  ?Component Value Date  ? HDL 66 08/28/2021  ? ?Lab Results  ?Component Value Date  ? Ecru 58 08/28/2021  ? ?Lab Results  ?Component Value Date  ? TRIG 91 08/28/2021  ? ?Lab Results  ?Component Value Date  ? CHOLHDL 2.2 08/28/2021  ? ?Lab Results  ?Component Value Date  ? HGBA1C 4.8 08/31/2021  ? ? ?  ?Assessment & Plan:  ?The patient did not complete this visit and left the clinic  ? ?The patient will not be rescheduled at this clinic ? ? ?Asencion Noble, MD ?

## 2022-02-03 ENCOUNTER — Encounter: Payer: Self-pay | Admitting: Critical Care Medicine

## 2022-02-03 ENCOUNTER — Other Ambulatory Visit: Payer: Self-pay

## 2022-02-03 ENCOUNTER — Ambulatory Visit: Payer: Medicare Other | Attending: Critical Care Medicine | Admitting: Critical Care Medicine

## 2022-02-03 VITALS — BP 152/92 | HR 88 | Wt 156.0 lb

## 2022-02-03 DIAGNOSIS — G40909 Epilepsy, unspecified, not intractable, without status epilepticus: Secondary | ICD-10-CM

## 2022-02-03 DIAGNOSIS — I25118 Atherosclerotic heart disease of native coronary artery with other forms of angina pectoris: Secondary | ICD-10-CM | POA: Insufficient documentation

## 2022-02-03 DIAGNOSIS — I700 Atherosclerosis of aorta: Secondary | ICD-10-CM

## 2022-02-03 DIAGNOSIS — J42 Unspecified chronic bronchitis: Secondary | ICD-10-CM

## 2022-02-03 DIAGNOSIS — F10130 Alcohol abuse with withdrawal, uncomplicated: Secondary | ICD-10-CM | POA: Insufficient documentation

## 2022-02-03 DIAGNOSIS — F332 Major depressive disorder, recurrent severe without psychotic features: Secondary | ICD-10-CM

## 2022-02-07 ENCOUNTER — Encounter: Payer: Self-pay | Admitting: *Deleted

## 2022-02-11 DIAGNOSIS — M25511 Pain in right shoulder: Secondary | ICD-10-CM | POA: Diagnosis not present

## 2022-02-19 ENCOUNTER — Other Ambulatory Visit: Payer: Self-pay | Admitting: Critical Care Medicine

## 2022-02-19 DIAGNOSIS — M25511 Pain in right shoulder: Secondary | ICD-10-CM | POA: Diagnosis not present

## 2022-02-19 DIAGNOSIS — F5104 Psychophysiologic insomnia: Secondary | ICD-10-CM

## 2022-02-25 ENCOUNTER — Telehealth: Payer: Self-pay | Admitting: *Deleted

## 2022-02-25 DIAGNOSIS — M25511 Pain in right shoulder: Secondary | ICD-10-CM | POA: Diagnosis not present

## 2022-02-25 NOTE — Telephone Encounter (Signed)
Last seen 06/2020. Hx of CAD, PAD. Upcoming visit 02/28/22. Clearance will be addressed at that time.  ? ?Will route to surgical team so they are aware.  ? ?Loel Dubonnet, NP  ? ?

## 2022-02-25 NOTE — Telephone Encounter (Signed)
? ?  Pre-operative Risk Assessment  ?  ?Patient Name: Spencer Mendoza  ?DOB: Jan 18, 1961 ?MRN: [id number]  ? ? PT HAS APPT 02/28/22 WITH Laurann Montana, NP ? ?Request for Surgical Clearance   ? ?Procedure:   RIGHT REVERSE TOTAL SHOULDER REPLACEMENT  ? ?Date of Surgery:  Clearance TBD                              ?   ?Surgeon:  DR. DAX VARKEY ?Surgeon's Group or Practice Name:  [facility] ?Phone number:  [phone] ATTN: Derek Jack ext [phone] ?Fax number:  [phone] ATTN: Derek Jack ?  ?Type of Clearance Requested:   ?- Medical  ?- Pharmacy:  Hold Aspirin   ?  ?Type of Anesthesia:  General  ?  ?Additional requests/questions:   ? ?Signed, ?Julaine Hua   ?02/25/2022, 9:21 AM  ? ?

## 2022-02-26 ENCOUNTER — Encounter: Payer: Self-pay | Admitting: Family

## 2022-02-26 ENCOUNTER — Ambulatory Visit (INDEPENDENT_AMBULATORY_CARE_PROVIDER_SITE_OTHER): Payer: Medicare Other | Admitting: Family

## 2022-02-26 VITALS — BP 123/90 | HR 72 | Temp 98.7°F | Ht 72.0 in | Wt 148.2 lb

## 2022-02-26 DIAGNOSIS — Z01818 Encounter for other preprocedural examination: Secondary | ICD-10-CM

## 2022-02-26 DIAGNOSIS — S46011A Strain of muscle(s) and tendon(s) of the rotator cuff of right shoulder, initial encounter: Secondary | ICD-10-CM | POA: Diagnosis not present

## 2022-02-26 LAB — COMPREHENSIVE METABOLIC PANEL
ALT: 15 U/L (ref 0–53)
AST: 21 U/L (ref 0–37)
Albumin: 4.5 g/dL (ref 3.5–5.2)
Alkaline Phosphatase: 103 U/L (ref 39–117)
BUN: 11 mg/dL (ref 6–23)
CO2: 29 mEq/L (ref 19–32)
Calcium: 9.8 mg/dL (ref 8.4–10.5)
Chloride: 96 mEq/L (ref 96–112)
Creatinine, Ser: 1.09 mg/dL (ref 0.40–1.50)
GFR: 72.5 mL/min (ref >60.00)
Glucose, Bld: 83 mg/dL (ref 70–99)
Potassium: 3.3 mEq/L — ABNORMAL LOW (ref 3.5–5.1)
Sodium: 135 mEq/L (ref 135–145)
Total Bilirubin: 0.9 mg/dL (ref 0.2–1.2)
Total Protein: 6.9 g/dL (ref 6.0–8.3)

## 2022-02-26 LAB — CBC WITH DIFFERENTIAL/PLATELET
Basophils Absolute: 0.1 10*3/uL (ref 0.0–0.1)
Basophils Relative: 0.7 % (ref 0.0–3.0)
Eosinophils Absolute: 0.1 10*3/uL (ref 0.0–0.7)
Eosinophils Relative: 0.7 % (ref 0.0–5.0)
HCT: 45.4 % (ref 39.0–52.0)
Hemoglobin: 15.3 g/dL (ref 13.0–17.0)
Lymphocytes Relative: 23.5 % (ref 12.0–46.0)
Lymphs Abs: 1.8 10*3/uL (ref 0.7–4.0)
MCHC: 33.8 g/dL (ref 30.0–36.0)
MCV: 97.8 fl (ref 78.0–100.0)
Monocytes Absolute: 0.6 10*3/uL (ref 0.1–1.0)
Monocytes Relative: 8.4 % (ref 3.0–12.0)
Neutro Abs: 5 10*3/uL (ref 1.4–7.7)
Neutrophils Relative %: 66.7 % (ref 43.0–77.0)
Platelets: 332 10*3/uL (ref 150.0–400.0)
RBC: 4.64 Mil/uL (ref 4.22–5.81)
RDW: 15.2 % (ref 11.5–15.5)
WBC: 7.6 10*3/uL (ref 4.0–10.5)

## 2022-02-26 NOTE — Progress Notes (Signed)
? ?Subjective:  ? ? ? Patient ID: Spencer Mendoza, male    DOB: 1960-12-13, 61 y.o.   MRN: [id number] ? ?Chief Complaint  ?Patient presents with  ? Establish Care  ? surgical clearance  ?  Pt states he tore his rotator cuff end of January ,And now needs medical clearance for right shoulder replacement.   ? ?HPI: ?Pain ?He reports chronic shoulder pain. There was not an injury that may have caused the pain. The pain started several months ago and is gradually worsening. The pain does not radiate. The pain is described as aching, burning, pinching, soreness, and stiffness, is moderate in intensity, occurring constantly. Symptoms are worse in the: all the time.Aggravating factors: nothing, unable to move arm, loss of ROM.  He has tried application of heat, application of ice, acetaminophen, and NSAIDs with little relief.  ? ?Health Maintenance Due  ?Topic Date Due  ? TETANUS/TDAP  Never done  ? Zoster Vaccines- Shingrix (1 of 2) Never done  ? ? ?Past Medical History:  ?Diagnosis Date  ? Abdominal pain 08/30/2019  ? Alcohol withdrawal ([place]) 07/29/2013  ? Allergy   ? Anxiety   ? Arthritis   ? Benzodiazepine abuse ([place]) 08/30/2019  ? Bipolar disorder ([place])   ? CAD (coronary artery disease)   ? Chronic insomnia 06/06/2015  ? Chronic migraine without aura, with intractable migraine, so stated, with status migrainosus 08/25/2019  ? Claustrophobia   ? Cocaine abuse ([place]) 05/22/2015  ? COPD (chronic obstructive pulmonary disease) ([place])   ? Delirium tremens ([place]) 07/29/2013  ? Depression   ? Epilepsy ([place])   ? last seizure week of 06-25-2020- sev. grand mal seizures per pt   ? GERD (gastroesophageal reflux disease)   ? Hemiplegic migraine with status migrainosus 06/06/2015  ? Hemiplegic migraine with status migrainosus 06/06/2015  ? Hypertension   ? Migraine   ? Opioid use disorder 08/30/2019  ? PONV (postoperative nausea and vomiting)   ? Stroke [facility]   ? patient denies- states had a hemiplegic migraine in ~2011  ? Substance  abuse ([place])   ? ? ?Past Surgical History:  ?Procedure Laterality Date  ? ANKLE SURGERY    ? in high school  ? APPENDECTOMY    ? COLONOSCOPY    ? KNEE ARTHROSCOPY Right   ? NOSE SURGERY    ? SHOULDER ARTHROSCOPY WITH DISTAL CLAVICLE RESECTION Right 12/04/2021  ? Procedure: SHOULDER ARTHROSCOPY WITH DISTAL CLAVICLE EXCISION;  Surgeon: Hiram Gash, MD;  Location: [place];  Service: Orthopedics;  Laterality: Right;  ? SHOULDER ARTHROSCOPY WITH SUBACROMIAL DECOMPRESSION, ROTATOR CUFF REPAIR AND BICEP TENDON REPAIR Right 12/04/2021  ? Procedure: SHOULDER ARTHROSCOPY WITH SUBACROMIAL DECOMPRESSION, ROTATOR CUFF REPAIR AND BICEP TENDON REPAIR;  Surgeon: Hiram Gash, MD;  Location: [place];  Service: Orthopedics;  Laterality: Right;  ? ? ?Outpatient Medications Prior to Visit  ?Medication Sig Dispense Refill  ? albuterol (VENTOLIN HFA) 108 (90 Base) MCG/ACT inhaler INHALE 2 PUFFS BY MOUTH INTO THE LUNGS EVERY 4 HOURS AS NEEDED FOR SHORTNESS OF BREATH OR WHEEZING 8.5 g 3  ? budesonide-formoterol (SYMBICORT) 160-4.5 MCG/ACT inhaler Inhale 2 puffs into the lungs 2 (two) times daily. 10.2 g 5  ? levETIRAcetam (KEPPRA) 1000 MG tablet Take 1 tablet (1,000 mg total) by mouth 2 (two) times daily. 60 tablet 11  ? venlafaxine XR (EFFEXOR-XR) 75 MG 24 hr capsule TAKE 1 CAPSULE(75 MG) BY MOUTH DAILY WITH BREAKFAST 30 capsule 0  ? ALPRAZolam Duanne Moron)  0.25 MG tablet Take 0.25 mg by mouth 2 (two) times daily as needed for anxiety. (Patient not taking: Reported on 02/26/2022)    ? aspirin EC 81 MG tablet Take 1 tablet (81 mg total) by mouth daily. Swallow whole. (Patient not taking: Reported on 02/26/2022) 30 tablet 11  ? busPIRone (BUSPAR) 10 MG tablet TAKE 1 TABLET(10 MG) BY MOUTH TWICE DAILY (Patient not taking: Reported on 02/26/2022) 60 tablet 0  ? meclizine (ANTIVERT) 25 MG tablet Take 1 tablet (25 mg total) by mouth every 8 (eight) hours as needed for nausea. (Patient not taking: Reported on  02/26/2022) 10 tablet 0  ? mirtazapine (REMERON) 15 MG tablet TAKE 1 TABLET(15 MG) BY MOUTH AT BEDTIME. NEEDS F/U with PCP for any additional refills. Needs NEW EKG (Patient not taking: Reported on 02/26/2022) 30 tablet 0  ? rosuvastatin (CRESTOR) 10 MG tablet Take 1 tablet (10 mg total) by mouth daily. (Patient not taking: Reported on 02/26/2022) 90 tablet 3  ? thiamine 100 MG tablet Take 1 tablet (100 mg total) by mouth daily. (Patient not taking: Reported on 02/26/2022) 30 tablet 3  ? ?No facility-administered medications prior to visit.  ? ? ?Allergies  ?Allergen Reactions  ? Sertraline Other (See Comments)  ?  Erectile dysfunction  ? Codeine Nausea And Vomiting  ? Depakote [Divalproex Sodium] Other (See Comments)  ?  Made patient shake  ? Migranal [Dihydroergotamine] Nausea And Vomiting  ? Topamax [Topiramate] Other (See Comments)  ?  Made patient angry  ? ? ? ?   ?Objective:  ?  ?Physical Exam ?Vitals and nursing note reviewed.  ?Constitutional:   ?   General: He is not in acute distress. ?   Appearance: Normal appearance.  ?HENT:  ?   Head: Normocephalic.  ?Cardiovascular:  ?   Rate and Rhythm: Normal rate and regular rhythm.  ?Pulmonary:  ?   Effort: Pulmonary effort is normal.  ?   Breath sounds: Normal breath sounds.  ?Musculoskeletal:  ?   Right shoulder: Tenderness present. Decreased range of motion.  ?   Cervical back: Normal range of motion.  ?Skin: ?   General: Skin is warm and dry.  ?Neurological:  ?   Mental Status: He is alert and oriented to person, place, and time.  ?Psychiatric:     ?   Mood and Affect: Mood normal.  ? ? ?BP 123/90 (BP Location: Left Arm, Patient Position: Sitting, Cuff Size: Large)   Pulse 72   Temp 98.7 ?F (37.1 ?C) (Temporal)   Ht 6' (1.829 m)   Wt 148 lb 4 oz (67.2 kg)   SpO2 98%   BMI 20.11 kg/m?  ?Wt Readings from Last 3 Encounters:  ?02/26/22 148 lb 4 oz (67.2 kg)  ?02/03/22 156 lb (70.8 kg)  ?12/04/21 158 lb 8.2 oz (71.9 kg)  ? ? ?   ?Assessment & Plan:  ? ?Problem  List Items Addressed This Visit   ?None ?Visit Diagnoses   ? ? Traumatic complete tear of right rotator cuff, initial encounter    -  Primary ?Slipped and fell in January. Surgery to be scheduled with [facility], pre-surgical form faxed from office. ?  ? Preop examination  - for right rotator cuff repair - no labs in chart, ordering basic labs today for pre-op clearance, will most likely need again as surgery is not scheduled yet. Pt has Cardiology appt on Friday and was told he had to quit smoking prior to surgery. ?    ?  Relevant Orders  ? Comp Met (CMET) (Completed)  ? CBC with Differential/Platelet (Completed)  ? ?  ? ?Jeanie Sewer, NP ? ?

## 2022-02-26 NOTE — Patient Instructions (Signed)
Welcome to [facility]! It was a pleasure meeting you today. ? ?Go to the lab for blood work today. ?You are clear from my perspective for surgery pending your lab results. ?We do not have a surgical clearance form from your Orthopedic surgeon. Please have them fax over so I can sign this.  ? ?Keep your cardiology appt Friday so they can sign their clearance. ? ?Good luck with your surgery! ? ? ? ? ? ?PLEASE NOTE: ? ?If you had any LAB tests please let us know if you have not heard back within a few days. You may see your results on MyChart before we have a chance to review them but we will give you a call once they are reviewed by [place]. If we ordered any REFERRALS today, please let us know if you have not heard from their office within the next week.  ?Let us know through MyChart if you are needing REFILLS, or have your pharmacy send [place] the request. You can also use MyChart to communicate with me or any office staff. ? ?

## 2022-02-27 NOTE — Progress Notes (Signed)
Hi Caitlin,  ?I believe this pt has an appointment with you tomorrow. I did labs for pre-op clearance yesterday. Note his low potassium. I'm curious what his EKG will show. He informed me he is no longer taking Remeron, and per chart notes, appears last refill was on 2/27. ?Can you please advise after your visit? ?Thank you!

## 2022-02-27 NOTE — Progress Notes (Signed)
? ?Office Visit  ?  ?Patient Name: Spencer Mendoza ?Date of Encounter: 02/28/2022 ? ?PCP:  Jeanie Sewer, NP ?  ?[place]  ?Cardiologist:  Candee Furbish, MD  ?Advanced Practice Provider:  No care team member to display ?Electrophysiologist:  None  ?  { ?Chief Complaint  ?  ?Spencer Mendoza is a 61 y.o. male with a hx of CAD, vasovagal syncope, PVD with stenosis of inferior mesenteric artery bilateral iliac artery and femoral artery with moderate stenosis, bipolar disorder, chronic migraine, etoh use, emphysema by CT  presents today for preop clearance  ? ?Past Medical History  ?  ?Past Medical History:  ?Diagnosis Date  ? Abdominal pain 08/30/2019  ? Alcohol withdrawal ([place]) 07/29/2013  ? Allergy   ? Anxiety   ? Arthritis   ? Benzodiazepine abuse ([place]) 08/30/2019  ? Bipolar disorder ([place])   ? CAD (coronary artery disease)   ? Chronic insomnia 06/06/2015  ? Chronic migraine without aura, with intractable migraine, so stated, with status migrainosus 08/25/2019  ? Claustrophobia   ? Cocaine abuse ([place]) 05/22/2015  ? COPD (chronic obstructive pulmonary disease) ([place])   ? Delirium tremens ([place]) 07/29/2013  ? Depression   ? Epilepsy ([place])   ? last seizure week of 06-25-2020- sev. grand mal seizures per pt   ? GERD (gastroesophageal reflux disease)   ? Hemiplegic migraine with status migrainosus 06/06/2015  ? Hemiplegic migraine with status migrainosus 06/06/2015  ? Hypertension   ? Migraine   ? Opioid use disorder 08/30/2019  ? PONV (postoperative nausea and vomiting)   ? Stroke [facility])   ? patient denies- states had a hemiplegic migraine in ~2011  ? Substance abuse ([place])   ? ?Past Surgical History:  ?Procedure Laterality Date  ? ANKLE SURGERY    ? in high school  ? APPENDECTOMY    ? COLONOSCOPY    ? KNEE ARTHROSCOPY Right   ? NOSE SURGERY    ? SHOULDER ARTHROSCOPY WITH DISTAL CLAVICLE RESECTION Right 12/04/2021  ? Procedure: SHOULDER ARTHROSCOPY WITH DISTAL CLAVICLE EXCISION;  Surgeon: Hiram Gash, MD;  Location: [place];  Service: Orthopedics;  Laterality: Right;  ? SHOULDER ARTHROSCOPY WITH SUBACROMIAL DECOMPRESSION, ROTATOR CUFF REPAIR AND BICEP TENDON REPAIR Right 12/04/2021  ? Procedure: SHOULDER ARTHROSCOPY WITH SUBACROMIAL DECOMPRESSION, ROTATOR CUFF REPAIR AND BICEP TENDON REPAIR;  Surgeon: Hiram Gash, MD;  Location: [place];  Service: Orthopedics;  Laterality: Right;  ? ? ?Allergies ? ?Allergies  ?Allergen Reactions  ? Sertraline Other (See Comments)  ?  Erectile dysfunction  ? Codeine Nausea And Vomiting  ? Depakote [Divalproex Sodium] Other (See Comments)  ?  Made patient shake  ? Migranal [Dihydroergotamine] Nausea And Vomiting  ? Topamax [Topiramate] Other (See Comments)  ?  Made patient angry  ? ? ?History of Present Illness  ?  ?Spencer Mendoza is a 61 y.o. male with a hx of CAD, vasovagal syncope, PVD with stenosis of inferior mesenteric artery bilateral iliac artery and femoral artery with moderate stenosis, bipolar disorder, chronic migraine, etoh use, emphysema by CT last seen 07/17/20. ? ?He initially established for symptoms of loss of conscious ness concerning for vasovagal syncope. Prior CT with emphysema but no evidence of aortopathy. Cardiac CTA 03/2020 coronary calcium score 330 placing him in 94th percentile for age and sex matched control with moderate plaque in the mid LAD and mild plaque in the RCA with normal FFR. ZIO June 2021 predominantly NSR with  rare junctional tachycardia, occasional SVT/PAT. ? ?Presents today for preop for right reverse total shoulder replacement. Beginning of January tore his rotator cuff lifting something heavy to help while his sister Then recently his Teaching laboratory technician on a leash and re-tore everything.  He shares with me that his rosuvastatin was stopped at a medical provider but he is not sure why.  He did not have side effects.  Anticipate that refills may have just run out. Reports no shortness of  breath nor dyspnea on exertion. Reports no chest pain, pressure, or tightness. No edema, orthopnea, PND. Reports no palpitations.   ? ?EKGs/Labs/Other Studies Reviewed:  ? ?The following studies were reviewed today: ?ZIO monitor 04/23/2020: ?  ?? Sinus rhythm ?? Rare junctional rhythm tachycardia ?? Occasoinal SVT, Paroxysmal atrial tachycardia. ?? No AFIB ?? No pauses ?  ?No adverse arrhythmias ?  ?Coronary CT scan 03/29/2020: ?2. Coronary calcium score of 330. This was 94th percentile for age ?and sex matched control. ?  ?2.  Normal coronary origin with right dominance. ?  ?3. Moderate plaque in the mid LAD with mild plaque in the RCA. Given ?the motion artifact this study is not a good candidate for FFRct. ?  ?4. Recommend aggressive risk factor modification including high ?potency statin. If symptoms persist, would have a low threshold to ?consider cardiac catheterization. ?  ?1. Normal FFR through coronary arteries (lowest FFR 0.90 in distal ?segments). ?  ?IMPRESSION: ?Normal FFR. No evidence of flow limiting CAD. Continue with medical ?management. ?  ? ?EKG:  EKG is  ordered today.  The ekg ordered today demonstrates normal sinus and 69 bpm with no acute ST/T wave changes. ? ?Recent Labs: ?08/26/2021: Magnesium 1.9 ?08/28/2021: TSH 1.898 ?02/26/2022: ALT 15; BUN 11; Creatinine, Ser 1.09; Hemoglobin 15.3; Platelets 332.0; Potassium 3.3; Sodium 135  ?Recent Lipid Panel ?   ?Component Value Date/Time  ? CHOL 142 08/28/2021 1819  ? CHOL 163 07/13/2020 0921  ? TRIG 91 08/28/2021 1819  ? HDL 66 08/28/2021 1819  ? HDL 101 07/13/2020 0921  ? CHOLHDL 2.2 08/28/2021 1819  ? VLDL 18 08/28/2021 1819  ? [place] 58 08/28/2021 1819  ? [place] 50 07/13/2020 0921  ? ?Home Medications  ? ?Current Meds  ?Medication Sig  ? albuterol (VENTOLIN HFA) 108 (90 Base) MCG/ACT inhaler INHALE 2 PUFFS BY MOUTH INTO THE LUNGS EVERY 4 HOURS AS NEEDED FOR SHORTNESS OF BREATH OR WHEEZING  ? budesonide-formoterol (SYMBICORT) 160-4.5 MCG/ACT  inhaler Inhale 2 puffs into the lungs 2 (two) times daily.  ? levETIRAcetam (KEPPRA) 1000 MG tablet Take 1 tablet (1,000 mg total) by mouth 2 (two) times daily.  ? rosuvastatin (CRESTOR) 10 MG tablet Take 1 tablet (10 mg total) by mouth daily.  ?  ? ?Review of Systems  ?    ?All other systems reviewed and are otherwise negative except as noted above. ? ?Physical Exam  ?  ?VS:  BP 130/84 (BP Location: Left Arm, Patient Position: Sitting, Cuff Size: Normal)   Pulse 69   Ht 6' (1.829 m)   Wt 150 lb 4.8 oz (68.2 kg)   BMI 20.38 kg/m?  , BMI Body mass index is 20.38 kg/m?. ? ?Wt Readings from Last 3 Encounters:  ?02/28/22 150 lb 4.8 oz (68.2 kg)  ?02/26/22 148 lb 4 oz (67.2 kg)  ?02/03/22 156 lb (70.8 kg)  ?  ? ?GEN: Well nourished, well developed, in no acute distress. ?HEENT: normal. ?Neck: Supple, no JVD, carotid bruits, or masses. ?Cardiac: RRR, no  murmurs, rubs, or gallops. No clubbing, cyanosis, edema.  Radials/PT 2+ and equal bilaterally.  ?Respiratory:  Respirations regular and unlabored, clear to auscultation bilaterally. ?GI: Soft, nontender, nondistended. ?MS: No deformity or atrophy. R shoulder in sling. ?Skin: Warm and dry, no rash. ?Neuro:  Strength and sensation are intact. ?Psych: Normal affect. ? ?Assessment & Plan  ?  ?Preop clearance - upcoming shoulder surgery.  Doing well from cardiac perspective. According to the Revised Cardiac Risk Index (RCRI), his Perioperative Risk of Major Cardiac Event is (%): 0.9. his  Functional Capacity in METs is: 6.61 according to the Duke Activity Status Index (DASI).  He is deemed To low risk for the planned procedure without additional cardiovascular testing.  Will route to surgical team so they are aware.  He is not presently taking aspirin and will defer resuming until after her surgical procedure. ? ?CAD - Stable with no anginal symptoms. No indication for ischemic evaluation.  He will resume aspirin 81 mg daily after her surgical procedure.  Resume  rosuvastatin, as detailed below. Heart healthy diet and regular cardiovascular exercise encouraged.   ? ?HLD - not taking statin but agreeable to start. Start rosuvastatin 10 mg daily with fasting lipid panel and c-Met in 2

## 2022-02-28 ENCOUNTER — Ambulatory Visit (INDEPENDENT_AMBULATORY_CARE_PROVIDER_SITE_OTHER): Payer: Medicare Other | Admitting: Family

## 2022-02-28 ENCOUNTER — Encounter (HOSPITAL_BASED_OUTPATIENT_CLINIC_OR_DEPARTMENT_OTHER): Payer: Self-pay | Admitting: Family

## 2022-02-28 VITALS — BP 130/84 | HR 69 | Ht 72.0 in | Wt 150.3 lb

## 2022-02-28 DIAGNOSIS — E785 Hyperlipidemia, unspecified: Secondary | ICD-10-CM

## 2022-02-28 DIAGNOSIS — I25118 Atherosclerotic heart disease of native coronary artery with other forms of angina pectoris: Secondary | ICD-10-CM

## 2022-02-28 DIAGNOSIS — Z01810 Encounter for preprocedural cardiovascular examination: Secondary | ICD-10-CM

## 2022-02-28 MED ORDER — ROSUVASTATIN CALCIUM 10 MG PO TABS: 10.0000 mg | ORAL_TABLET | Freq: Every day | ORAL | 3 refills | Status: DC

## 2022-02-28 NOTE — Patient Instructions (Signed)
Medication Instructions:  ?Your physician has recommended you make the following change in your medication:  ? ?START Aspirin EC '81mg'$  after your shoulder surgery ? ?START Rosuvastatin '10mg'$  daily ? ?*If you need a refill on your cardiac medications before your next appointment, please call your pharmacy* ? ? ?Lab Work: ?Your physician recommends that you return for lab work in 2 months: CMET, fasting lipid panel ? ?Please return for Lab work. You may come to the...  ? ?[place] (3rd floor) ?[address], [place], [place]  ?Open: 8am-Noon and 1pm-4:30pm  ? ?[place] at [facility] ?[place]  ? ?[employer]- Any location ? ?**no appointments needed**  ? ?If you have labs (blood work) drawn today and your tests are completely normal, you will receive your results only by: ?MyChart Message (if you have MyChart) OR ?A paper copy in the mail ?If you have any lab test that is abnormal or we need to change your treatment, we will call you to review the results. ? ? ?Testing/Procedures: ?Your EKG today showed normal sinus rhythm.  ? ? ?Follow-Up: ?At [facility], you and your health needs are our priority.  As part of our continuing mission to provide you with exceptional heart care, we have created designated Provider Care Teams.  These Care Teams include your primary Cardiologist (physician) and Advanced Practice Providers (APPs -  Physician Assistants and Nurse Practitioners) who all work together to provide you with the care you need, when you need it. ? ?We recommend signing up for the patient portal called "MyChart".  Sign up information is provided on this After Visit Summary.  MyChart is used to connect with patients for Virtual Visits (Telemedicine).  Patients are able to view lab/test results, encounter notes, upcoming appointments, etc.  Non-urgent messages can be sent to your provider as well.   ?To learn more about what you can do with MyChart, go  to [email or web address].   ? ?Your next appointment:   ?6 month(s) ? ?The format for your next appointment:   ?In Person ? ?Provider:   ?Candee Furbish, MD or Loel Dubonnet, NP   ? ? ?Other Instructions ? ?You are cleared for surgery! ? ?Important Information About Sugar ? ? ? ? ?  ?

## 2022-03-03 ENCOUNTER — Other Ambulatory Visit: Payer: Self-pay | Admitting: Family

## 2022-03-03 DIAGNOSIS — E876 Hypokalemia: Secondary | ICD-10-CM

## 2022-03-03 MED ORDER — POTASSIUM CHLORIDE CRYS ER 20 MEQ PO TBCR: 20.0000 meq | EXTENDED_RELEASE_TABLET | Freq: Every day | ORAL | 1 refills | Status: DC

## 2022-03-03 NOTE — Progress Notes (Signed)
Spencer Mendoza, ? ?Your labs are all good, except for the potassium. I consulted with cardiology regarding your low potassium as I wanted to see what your EKG showed and Caitlin, the provider you saw, states it was in normal range. So we agreed you should take a supplement that I am sending to your pharmacy to increase your potassium level, and then I want to recheck the level in one week. Please schedule this today - just a lab only, non-fasting appointment. ? ?Potassium is important for heart rhythm and could affect your surgery if not in normal range.  If too low or high, you may feel dizzy, palpitations, chest pain, or nausea, you need to seek the ER if these symptoms occur. ?Please start the potassium pill today, I have sent it to your pharmacy. ? ?Any questions, let me know. ?

## 2022-03-03 NOTE — Progress Notes (Addendum)
? ? ?Patient: Spencer Mendoza ?Date of birth: 05-Nov-1961 ? ?Reason for Visit: follow up for seizure, migraine ?History from: patient ?Primary Neurologist: Dr. April Manson ? ?HISTORY OF PRESENT ILLNESS: ?Today 03/04/22 ?[name] here today for follow-up. Admitted to [facility] October 2022 after suicide attempt overdosing on trazodone. Having right shoulder replacement, needs pre-op clearance, had right rotator cuff repair that failed after his dog jumped on him. Pending scheduling. Having migraines few times a week, takes Tylenol, doesn't help much. Has been chronic,. On keppra 1000 mg twice daily, not missing dosing. No seizures that he can recall, in Feb, few days after right rotator cuff surgery, dog jumped on him, injured his surgical repair, not sleeping, a lot of pain (arm, migraines), blacked out, but claims wasn't seizure, endorsed by significant other.  Last Botox injection was in March 2021, was more than 75% helpful. Would like to restart Botox. He stopped smoking for surgery.  ? ?Update 02/25/2021 SS: [name] is a 61 year old male with history of episodic seizures associated with generalized tonic-clonic events, bipolar disorder, and migraine headaches.  Remains on Keppra 1000 mg twice daily, denies any seizure events in the last 6 months.  On average, 2 migraines a month, he keeps a Decadron taper on hand in the event of a prolonged headache, for mild headache takes Tylenol or BCs.  Claims he may only use once Decadron every other month.  Was last sent in January by his PCP.  Botox has been most beneficial, but he has not been able to afford the Botox co-pay.  Claims his mental health is doing well, can no longer get Xanax.  Takes BuSpar.  He works a few days a month, directing traffic on a Architect site.  He is involved in his church.  He presents today for evaluation accompanied by his fianc?e, Spencer Mendoza. ? ?Update 08/27/2020 SS: [name] is a 61 year old male with history of episodic seizures  associated with generalized tonic-clonic events, bipolar disorder, and migraine headaches.  He is on trazodone 200 mg at bedtime, Keppra 1000 mg twice a day, and received Botox therapy.  At last visit, Keppra was increased due to reported seizure 02/15/2020.  No seizure since.  Has had weight loss, has stabilized around 152 lbs, was 155 lbs 6 month ago.  Had colonoscopy, a few precancer polyps removed.  No etiology for weight loss found.  Recently having significant panic attacks, PCP put him on a short course of Xanax PRN, seen new [facility] psychiatry in November.  Not working, on disability.  More headache, could not afford Botox co-pay, last Botox in March 2021, Botox was really beneficial for him.  Continues to smoke cigarettes, we talked about history of epilepsy versus pseudoseizures, indicates epilepsy since he was born, his mom had epilepsy.  No EtOH in over 1 year.  Here today accompanied by his significant other. ? ?HISTORY ?02/23/2020 SS: [name] is a 61 year old male with history of episodic seizures associated with generalized tonic-clonic events, bipolar disorder, and migraine headache.  He is taking trazodone 200 mg at bedtime, Keppra 750 mg twice a day.  He received Botox therapy in November and March for migraines.  Botox has been very helpful for his headaches, did have 1 severe headache following his Botox injection in March, keeps Decadron taper on hand, took this with good benefit once in March.  Overall, headaches doing well.  He may have had a seizure 02/15/2020, he thinks that he did, is unclear of details, worked that day, was  stressed out, came home, had preseizure feeling, laid down, unclear what happened next or does not want to elaborate.  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 Yesterday complained of chest pain, pain in his left arm, shortness of breath, vomiting.  Denies  any EtOH, drug use.  Is smoking half pack a day.  In less than 2 months, has lost about 15 pounds unintentionally.  He presents today accompanied by his fianc?e Spencer Mendoza. ? ?REVIEW OF SYSTEMS: Out of a complete 14 system review of symptoms, the patient complains only of the following symptoms, and all other reviewed systems are negative. ? ?See HPI ? ?ALLERGIES: ?Allergies  ?Allergen Reactions  ? Sertraline Other (See Comments)  ?  Erectile dysfunction  ? Codeine Nausea And Vomiting  ? Depakote [Divalproex Sodium] Other (See Comments)  ?  Made patient shake  ? Migranal [Dihydroergotamine] Nausea And Vomiting  ? Topamax [Topiramate] Other (See Comments)  ?  Made patient angry  ? ? ?HOME MEDICATIONS: ?Outpatient Medications Prior to Visit  ?Medication Sig Dispense Refill  ? albuterol (VENTOLIN HFA) 108 (90 Base) MCG/ACT inhaler INHALE 2 PUFFS BY MOUTH INTO THE LUNGS EVERY 4 HOURS AS NEEDED FOR SHORTNESS OF BREATH OR WHEEZING 8.5 g 3  ? budesonide-formoterol (SYMBICORT) 160-4.5 MCG/ACT inhaler Inhale 2 puffs into the lungs 2 (two) times daily. 10.2 g 5  ? rosuvastatin (CRESTOR) 10 MG tablet Take 1 tablet (10 mg total) by mouth daily. 90 tablet 3  ? levETIRAcetam (KEPPRA) 1000 MG tablet Take 1 tablet (1,000 mg total) by mouth 2 (two) times daily. 60 tablet 11  ? potassium chloride SA (KLOR-CON M) 20 MEQ tablet Take 1 tablet (20 mEq total) by mouth daily. Recheck labs in 1 week. 30 tablet 1  ? ?No facility-administered medications prior to visit.  ? ? ?PAST MEDICAL HISTORY: ?Past Medical History:  ?Diagnosis Date  ? Abdominal pain 08/30/2019  ? Alcohol withdrawal ([place]) 07/29/2013  ? Allergy   ? Anxiety   ? Arthritis   ? Benzodiazepine abuse ([place]) 08/30/2019  ? Bipolar disorder ([place])   ? CAD (coronary artery disease)   ? Chronic insomnia 06/06/2015  ? Chronic migraine without aura, with intractable migraine, so stated, with status migrainosus 08/25/2019  ? Claustrophobia   ? Cocaine abuse ([place]) 05/22/2015  ? COPD (chronic  obstructive pulmonary disease) ([place])   ? Delirium tremens ([place]) 07/29/2013  ? Depression   ? Epilepsy ([place])   ? last seizure week of 06-25-2020- sev. grand mal seizures per pt   ? GERD (gastroesophageal reflux disease)   ? Hemiplegic migraine with status migrainosus 06/06/2015  ? Hemiplegic migraine with status migrainosus 06/06/2015  ? Hypertension   ? Migraine   ? Opioid use disorder 08/30/2019  ? PONV (postoperative nausea and vomiting)   ? Stroke [facility])   ? patient denies- states had a hemiplegic migraine in ~2011  ? Substance abuse ([place])   ? ? ?PAST SURGICAL HISTORY: ?Past Surgical History:  ?Procedure Laterality Date  ? ANKLE SURGERY    ? in high school  ? APPENDECTOMY    ? COLONOSCOPY    ? KNEE ARTHROSCOPY Right   ? NOSE SURGERY    ? SHOULDER ARTHROSCOPY WITH DISTAL CLAVICLE RESECTION Right 12/04/2021  ? Procedure: SHOULDER ARTHROSCOPY WITH DISTAL CLAVICLE EXCISION;  Surgeon: Hiram Gash, MD;  Location: [place];  Service: Orthopedics;  Laterality: Right;  ?  SHOULDER ARTHROSCOPY WITH SUBACROMIAL DECOMPRESSION, ROTATOR CUFF REPAIR AND BICEP TENDON REPAIR Right 12/04/2021  ? Procedure: SHOULDER ARTHROSCOPY WITH SUBACROMIAL DECOMPRESSION, ROTATOR CUFF REPAIR AND BICEP TENDON REPAIR;  Surgeon: Hiram Gash, MD;  Location: [place];  Service: Orthopedics;  Laterality: Right;  ? ? ?FAMILY HISTORY: ?Family History  ?Problem Relation Age of Onset  ? Heart disease Mother   ? Stroke Mother   ? Epilepsy Mother   ? Heart failure Mother   ? Cancer Father   ? Heart disease Father   ? Hypertension Father   ? Melanoma Father   ? Migraines Father   ? Colon cancer Sister 6  ? Colon cancer Cousin   ? Cancer Cousin   ? Colon polyps Neg Hx   ? Esophageal cancer Neg Hx   ? Rectal cancer Neg Hx   ? Stomach cancer Neg Hx   ? ? ?SOCIAL HISTORY: ?Social History  ? ?Socioeconomic History  ? Marital status: Legally Separated  ?  Spouse name: Not on file  ? Number of children: 0  ? Years of education: GED   ? Highest education level: Not on file  ?Occupational History  ? Occupation: disabled  ?Tobacco Use  ? Smoking status: Every Day  ?  Packs/day: 0.25  ?  Years: 15.00  ?  Pack years: 3.75  ?  Types: Cigarettes

## 2022-03-04 ENCOUNTER — Ambulatory Visit (INDEPENDENT_AMBULATORY_CARE_PROVIDER_SITE_OTHER): Payer: Medicare Other | Admitting: Neurology

## 2022-03-04 ENCOUNTER — Encounter: Payer: Self-pay | Admitting: Neurology

## 2022-03-04 ENCOUNTER — Telehealth: Payer: Self-pay | Admitting: Neurology

## 2022-03-04 VITALS — BP 156/91 | HR 70 | Ht 72.0 in | Wt 151.5 lb

## 2022-03-04 DIAGNOSIS — R569 Unspecified convulsions: Secondary | ICD-10-CM | POA: Diagnosis not present

## 2022-03-04 DIAGNOSIS — G43711 Chronic migraine without aura, intractable, with status migrainosus: Secondary | ICD-10-CM

## 2022-03-04 MED ORDER — LEVETIRACETAM 1000 MG PO TABS: 1000.0000 mg | ORAL_TABLET | Freq: Two times a day (BID) | ORAL | 11 refills | Status: DC

## 2022-03-04 NOTE — Patient Instructions (Signed)
Great to see you today  ?Check keppra level today  ?Continue Keppra ?Call for any seizures ?Will try to get set up for Botox  ?See you back in 1 year  ?

## 2022-03-04 NOTE — Telephone Encounter (Signed)
Can we try to get Spencer Mendoza approved for Botox for chronic migraine headache, he was last injected in March 2021, was more than 75% helpful, but cost became too high, he now has different insurance now. He has 2-3 migraines weekly.  ? ? He has been on a multitude of medications in the past for migraine including propranolol, amitriptyline, Topamax, Depakote, Effexor, gabapentin, Seroquel, Prozac, Aimovig, Ajovy was denied, he tried [employer], also Lamictal.  ?

## 2022-03-05 ENCOUNTER — Other Ambulatory Visit: Payer: Self-pay

## 2022-03-05 ENCOUNTER — Telehealth: Payer: Self-pay

## 2022-03-05 ENCOUNTER — Telehealth: Payer: Self-pay | Admitting: Neurology

## 2022-03-05 LAB — LEVETIRACETAM LEVEL: Levetiracetam Lvl: 44.8 ug/mL — ABNORMAL HIGH (ref 10.0–40.0)

## 2022-03-05 MED ORDER — BOTOX 100 UNITS IJ SOLR: INTRAMUSCULAR | 3 refills | Status: DC

## 2022-03-05 MED ORDER — POTASSIUM CHLORIDE CRYS ER 20 MEQ PO TBCR: 20.0000 meq | EXTENDED_RELEASE_TABLET | Freq: Every day | ORAL | 3 refills | Status: DC

## 2022-03-05 NOTE — Telephone Encounter (Signed)
Initiated Botox PA on [facility] portal. PA was approved PA # [id number] (03/05/2022-03/06/2023). Please send Botox RX to [facility]. ?

## 2022-03-05 NOTE — Telephone Encounter (Signed)
MEDICATION: potassium chloride SA (KLOR-CON M) 20 MEQ tablet  ? ?PHARMACY:  ?[facility] [phone] - [place], [address] Phone:  [phone]  ?Fax:  [phone]  ?  ? ? ?Comments: He threw the last one away he thought it was something else.  ? ? ? ? ? ? ? ? ?**Let patient know to contact pharmacy at the end of the day to make sure medication is ready. ** ? ?** Please notify patient to allow 48-72 hours to process** ? ?**Encourage patient to contact the pharmacy for refills or they can request refills through [facility]** ? ?

## 2022-03-05 NOTE — Telephone Encounter (Signed)
Rx will be sent to requested pharmacy. ?

## 2022-03-06 ENCOUNTER — Ambulatory Visit: Payer: Medicare Other | Admitting: Family

## 2022-03-07 ENCOUNTER — Encounter: Payer: Self-pay | Admitting: Family

## 2022-03-07 ENCOUNTER — Ambulatory Visit (INDEPENDENT_AMBULATORY_CARE_PROVIDER_SITE_OTHER): Payer: Medicare Other | Admitting: Family

## 2022-03-07 VITALS — BP 150/84 | HR 65 | Temp 97.6°F | Ht 72.0 in | Wt 153.5 lb

## 2022-03-07 DIAGNOSIS — E876 Hypokalemia: Secondary | ICD-10-CM | POA: Diagnosis not present

## 2022-03-07 DIAGNOSIS — F5104 Psychophysiologic insomnia: Secondary | ICD-10-CM | POA: Diagnosis not present

## 2022-03-07 MED ORDER — TRAZODONE HCL 100 MG PO TABS: 100.0000 mg | ORAL_TABLET | Freq: Every day | ORAL | 2 refills | Status: DC

## 2022-03-07 NOTE — Progress Notes (Signed)
? ?Subjective:  ? ? ? Patient ID: Spencer Mendoza, male    DOB: Jul 14, 1961, 61 y.o.   MRN: [id number] ? ?Chief Complaint  ?Patient presents with  ? Insomnia  ?   Pt states he is not able to sleep in the day/at night at all.   ? ?HPI: ?INSOMNIA:  How long: years. Difficulty initiating sleep: yes.  Difficulty maintaining sleep: yes.  OTC meds tried: no. RX meds in past: Remeron, Trazodone, Valium, Seroquel, Phenobarbitol. Sleep hygiene measures: yes. Started any new meds recently: no. Shift worker: no. New stressors: yes, having pain in his right shoulder, scheduled for surgery 5/11.  ? ? ?Health Maintenance Due  ?Topic Date Due  ? TETANUS/TDAP  Never done  ? Zoster Vaccines- Shingrix (1 of 2) Never done  ? ? ?Past Medical History:  ?Diagnosis Date  ? Abdominal pain 08/30/2019  ? Alcohol withdrawal ([place]) 07/29/2013  ? Allergy   ? Anxiety   ? Arthritis   ? Benzodiazepine abuse ([place]) 08/30/2019  ? Bipolar disorder ([place])   ? CAD (coronary artery disease)   ? Chronic insomnia 06/06/2015  ? Chronic migraine without aura, with intractable migraine, so stated, with status migrainosus 08/25/2019  ? Claustrophobia   ? Cocaine abuse ([place]) 05/22/2015  ? COPD (chronic obstructive pulmonary disease) ([place])   ? Delirium tremens ([place]) 07/29/2013  ? Depression   ? Epilepsy ([place])   ? last seizure week of 06-25-2020- sev. grand mal seizures per pt   ? GERD (gastroesophageal reflux disease)   ? Hemiplegic migraine with status migrainosus 06/06/2015  ? Hemiplegic migraine with status migrainosus 06/06/2015  ? Hypertension   ? Migraine   ? Opioid use disorder 08/30/2019  ? PONV (postoperative nausea and vomiting)   ? Stroke [facility])   ? patient denies- states had a hemiplegic migraine in ~2011  ? Substance abuse ([place])   ? ? ?Past Surgical History:  ?Procedure Laterality Date  ? ANKLE SURGERY    ? in high school  ? APPENDECTOMY    ? COLONOSCOPY    ? KNEE ARTHROSCOPY Right   ? NOSE SURGERY    ? SHOULDER ARTHROSCOPY WITH DISTAL CLAVICLE RESECTION  Right 12/04/2021  ? Procedure: SHOULDER ARTHROSCOPY WITH DISTAL CLAVICLE EXCISION;  Surgeon: Hiram Gash, MD;  Location: [facility];  Service: Orthopedics;  Laterality: Right;  ? SHOULDER ARTHROSCOPY WITH SUBACROMIAL DECOMPRESSION, ROTATOR CUFF REPAIR AND BICEP TENDON REPAIR Right 12/04/2021  ? Procedure: SHOULDER ARTHROSCOPY WITH SUBACROMIAL DECOMPRESSION, ROTATOR CUFF REPAIR AND BICEP TENDON REPAIR;  Surgeon: Hiram Gash, MD;  Location: [facility];  Service: Orthopedics;  Laterality: Right;  ? ? ?Outpatient Medications Prior to Visit  ?Medication Sig Dispense Refill  ? albuterol (VENTOLIN HFA) 108 (90 Base) MCG/ACT inhaler INHALE 2 PUFFS BY MOUTH INTO THE LUNGS EVERY 4 HOURS AS NEEDED FOR SHORTNESS OF BREATH OR WHEEZING 8.5 g 3  ? botulinum toxin Type A (BOTOX) 100 units SOLR injection Inject 155 units every three months. 2 each 3  ? budesonide-formoterol (SYMBICORT) 160-4.5 MCG/ACT inhaler Inhale 2 puffs into the lungs 2 (two) times daily. 10.2 g 5  ? levETIRAcetam (KEPPRA) 1000 MG tablet Take 1 tablet (1,000 mg total) by mouth 2 (two) times daily. 60 tablet 11  ? potassium chloride SA (KLOR-CON M) 20 MEQ tablet Take 1 tablet (20 mEq total) by mouth daily. 30 tablet 3  ? rosuvastatin (CRESTOR) 10 MG tablet Take 1 tablet (10 mg total) by mouth daily. 90 tablet 3  ?  celecoxib (CELEBREX) 200 MG capsule Take 200 mg by mouth 2 (two) times daily. (Patient not taking: Reported on 03/07/2022)    ? ?No facility-administered medications prior to visit.  ? ? ?Allergies  ?Allergen Reactions  ? Sertraline Other (See Comments)  ?  Erectile dysfunction  ? Codeine Nausea And Vomiting  ? Depakote [Divalproex Sodium] Other (See Comments)  ?  Made patient shake  ? Migranal [Dihydroergotamine] Nausea And Vomiting  ? Topamax [Topiramate] Other (See Comments)  ?  Made patient angry  ? ? ? ?   ?Objective:  ?  ?Physical Exam ?Vitals and nursing note reviewed.  ?Constitutional:   ?   General: He is not in  acute distress. ?   Appearance: Normal appearance.  ?HENT:  ?   Head: Normocephalic.  ?Cardiovascular:  ?   Rate and Rhythm: Normal rate and regular rhythm.  ?Pulmonary:  ?   Effort: Pulmonary effort is normal.  ?   Breath sounds: Normal breath sounds.  ?Musculoskeletal:     ?   General: Normal range of motion.  ?   Cervical back: Normal range of motion.  ?Skin: ?   General: Skin is warm and dry.  ?Neurological:  ?   Mental Status: He is alert and oriented to person, place, and time.  ?Psychiatric:     ?   Mood and Affect: Mood normal.  ? ? ?BP (!) 150/84 (BP Location: Left Arm, Patient Position: Sitting, Cuff Size: Large)   Pulse 65   Temp 97.6 ?F (36.4 ?C) (Temporal)   Ht 6' (1.829 m)   Wt 153 lb 8 oz (69.6 kg)   SpO2 98%   BMI 20.82 kg/m?  ?Wt Readings from Last 3 Encounters:  ?03/07/22 153 lb 8 oz (69.6 kg)  ?03/04/22 151 lb 8 oz (68.7 kg)  ?02/28/22 150 lb 4.8 oz (68.2 kg)  ? ? ?   ?Assessment & Plan:  ? ?Problem List Items Addressed This Visit   ? ?  ? Other  ? Chronic insomnia - Primary  ?  Chronic - pt reports having problem since childhood. Has been on many different medicines. All have interaction with his Keppra, but he has taken Trazodone in past and had no problems. Advised starting with '100mg'$ , better to keep dose low if possible so as not to develop dependency and need for higher doses. Also better if not taken qhs, qod is better. f/u in 3 mos. ? ?  ?  ? Relevant Medications  ? traZODone (DESYREL) 100 MG tablet  ? ?Other Visit Diagnoses   ? ? Hypokalemia     ?pt reports he lost the RX bottle for K+ that I sent in - unsure if he took any - has been resent - advised he take daily for 1 week, then return for lab only recheck. Advised on importance of K+ related to his heart.  ? ?  ? ?Meds ordered this encounter  ?Medications  ? traZODone (DESYREL) 100 MG tablet  ?  Sig: Take 1-2 tablets (100-200 mg total) by mouth at bedtime.  ?  Dispense:  60 tablet  ?  Refill:  2  ?  Order Specific Question:    Supervising Provider  ?  Answer:   ANDY, CAMILLE L [[id number]]  ? ? ?Jeanie Sewer, NP ? ?

## 2022-03-07 NOTE — Assessment & Plan Note (Signed)
Chronic - pt reports having problem since childhood. Has been on many different medicines. All have interaction with his Keppra, but he has taken Trazodone in past and had no problems. Advised starting with '100mg'$ , better to keep dose low if possible so as not to develop dependency and need for higher doses. Also better if not taken qhs, qod is better. f/u in 3 mos. ?

## 2022-03-07 NOTE — Patient Instructions (Signed)
I have refilled Trazodone for you for your insomnia. Take 1 pill nightly, ok to take 2 if needed, but always better to take less so as not to build dependence on the medication. ? ?Start the potassium supplement I sent to your pharmacy, 1 pill daily, and then schedule a 1 week follow up visit to recheck your potassium level. ? ?Schedule another 3 month follow up for your Trazodone refill.   ? ?Good luck with your surgery :-) ?

## 2022-03-11 ENCOUNTER — Encounter (HOSPITAL_BASED_OUTPATIENT_CLINIC_OR_DEPARTMENT_OTHER): Payer: Self-pay | Admitting: Orthopaedic Surgery

## 2022-03-11 ENCOUNTER — Telehealth: Payer: Self-pay | Admitting: Family

## 2022-03-11 ENCOUNTER — Encounter (HOSPITAL_BASED_OUTPATIENT_CLINIC_OR_DEPARTMENT_OTHER)
Admission: RE | Admit: 2022-03-11 | Discharge: 2022-03-11 | Disposition: A | Discharge status: Home / Self Care | Payer: Medicare Other | Source: Ambulatory Visit | Attending: Orthopaedic Surgery | Admitting: Orthopaedic Surgery

## 2022-03-11 ENCOUNTER — Other Ambulatory Visit: Payer: Self-pay

## 2022-03-11 DIAGNOSIS — Z01812 Encounter for preprocedural laboratory examination: Secondary | ICD-10-CM | POA: Insufficient documentation

## 2022-03-11 DIAGNOSIS — I25118 Atherosclerotic heart disease of native coronary artery with other forms of angina pectoris: Secondary | ICD-10-CM | POA: Insufficient documentation

## 2022-03-11 LAB — BASIC METABOLIC PANEL
Anion gap: 9 (ref 5–15)
BUN: 10 mg/dL (ref 8–23)
CO2: 26 mmol/L (ref 22–32)
Calcium: 9.9 mg/dL (ref 8.9–10.3)
Chloride: 99 mmol/L (ref 98–111)
Creatinine, Ser: 1.01 mg/dL (ref 0.61–1.24)
GFR, Estimated: >60 mL/min (ref >60)
Glucose, Bld: 92 mg/dL (ref 70–99)
Potassium: 5.1 mmol/L (ref 3.5–5.1)
Sodium: 134 mmol/L — ABNORMAL LOW (ref 135–145)

## 2022-03-11 LAB — SURGICAL PCR SCREEN
MRSA, PCR: NEGATIVE
Staphylococcus aureus: NEGATIVE

## 2022-03-11 NOTE — Progress Notes (Signed)
Surgical soap given with instructions, pt verbalized understanding. Enhanced Recovery after Surgery  ?Enhanced Recovery after Surgery is a protocol used to improve the stress on your body and your recovery after surgery. ? ?Patient Instructions ? ?The night before surgery:  ?No food after midnight. ONLY clear liquids after midnight ? ?The day of surgery (if you do NOT have diabetes):  ?Drink ONE (1) Pre-Surgery Clear Ensure as directed.   ?This drink was given to you during your hospital  ?pre-op appointment visit. ?The pre-op nurse will instruct you on the time to drink the  ?Pre-Surgery Ensure depending on your surgery time. ?Finish the drink at the designated time by the pre-op nurse.  ?Nothing else to drink after completing the  ?Pre-Surgery Clear Ensure. ? ?The day of surgery (if you have diabetes): ?Drink ONE (1) Gatorade 2 (G2) as directed. ?This drink was given to you during your hospital  ?pre-op appointment visit.  ?The pre-op nurse will instruct you on the time to drink the  ? Gatorade 2 (G2) depending on your surgery time. ?Color of the Gatorade may vary. Red is not allowed. ?Nothing else to drink after completing the  ?Gatorade 2 (G2). ? ?       If office.you have questions, please contact your surgeon?s office  ?Benzoyl peroxide gel given with written instructions, pt verbalized understanding.  ?

## 2022-03-11 NOTE — Telephone Encounter (Signed)
Pt states the BMET was taken at [facility] PAT appt. on 04/25.  ? ?Pt cancelled 04/28 lab appt at [facility]. ?

## 2022-03-14 ENCOUNTER — Other Ambulatory Visit: Payer: Medicare Other

## 2022-03-20 NOTE — H&P (Signed)
? ? ?PREOPERATIVE H&P ? ?Chief Complaint: djd right shoulder ? ?HPI: ?Spencer Mendoza is a 61 y.o. male who is scheduled for Procedure(s): ?REVERSE SHOULDER ARTHROPLASTY.  ? ?Patient has a past medical history significant for substance abuse, epilepsy, COPD, PONV, bipolar disorder.  ? ?Patient underwent surgery for a massive rotator cuff tear on 12/04/2021. The patient was doing well after we discontinued the sling and did an injection at his last visit on 01/17/2022.  Unfortunately, he had a seizure two days ago and fell forward on his arm per a witness.  He has had worsening pain in the right shoulder since then.  He is unable to use his shoulder like he did before the seizure.  His pain has worsened dramatically.  He is frustrated by this.   ? ?Symptoms are rated as moderate to severe, and have been worsening.  This is significantly impairing activities of daily living.   ? ?Please see clinic note for further details on this patient's care.   ? ?He has elected for surgical management.  ? ?Past Medical History:  ?Diagnosis Date  ? Abdominal pain 08/30/2019  ? Alcohol withdrawal ([place]) 07/29/2013  ? Allergy   ? Anxiety   ? Arthritis   ? Benzodiazepine abuse ([place]) 08/30/2019  ? Bipolar disorder ([place])   ? CAD (coronary artery disease)   ? Chronic insomnia 06/06/2015  ? Chronic migraine without aura, with intractable migraine, so stated, with status migrainosus 08/25/2019  ? Claustrophobia   ? Cocaine abuse ([place]) 05/22/2015  ? COPD (chronic obstructive pulmonary disease) ([place])   ? Delirium tremens ([place]) 07/29/2013  ? Epilepsy ([place])   ? last seizure week of 06-25-2020- sev. grand mal seizures per pt   ? Hemiplegic migraine with status migrainosus 06/06/2015  ? Hemiplegic migraine with status migrainosus 06/06/2015  ? Migraine   ? Opioid use disorder 08/30/2019  ? PONV (postoperative nausea and vomiting)   ? Stroke [facility])   ? patient denies- states had a hemiplegic migraine in ~2011  ? Substance abuse ([place])   ? ?Past  Surgical History:  ?Procedure Laterality Date  ? ANKLE SURGERY    ? in high school  ? APPENDECTOMY    ? COLONOSCOPY    ? KNEE ARTHROSCOPY Right   ? NOSE SURGERY    ? SHOULDER ARTHROSCOPY WITH DISTAL CLAVICLE RESECTION Right 12/04/2021  ? Procedure: SHOULDER ARTHROSCOPY WITH DISTAL CLAVICLE EXCISION;  Surgeon: Hiram Gash, MD;  Location: [facility];  Service: Orthopedics;  Laterality: Right;  ? SHOULDER ARTHROSCOPY WITH SUBACROMIAL DECOMPRESSION, ROTATOR CUFF REPAIR AND BICEP TENDON REPAIR Right 12/04/2021  ? Procedure: SHOULDER ARTHROSCOPY WITH SUBACROMIAL DECOMPRESSION, ROTATOR CUFF REPAIR AND BICEP TENDON REPAIR;  Surgeon: Hiram Gash, MD;  Location: [facility];  Service: Orthopedics;  Laterality: Right;  ? ?Social History  ? ?Socioeconomic History  ? Marital status: Legally Separated  ?  Spouse name: Not on file  ? Number of children: 0  ? Years of education: GED  ? Highest education level: Not on file  ?Occupational History  ? Occupation: disabled  ?Tobacco Use  ? Smoking status: Every Day  ?  Packs/day: 0.25  ?  Years: 15.00  ?  Pack years: 3.75  ?  Types: Cigarettes  ? Smokeless tobacco: Never  ? Tobacco comments:  ?  States he quit 02-25-22  ?Vaping Use  ? Vaping Use: Never used  ?Substance and Sexual Activity  ? Alcohol use: Not Currently  ?  Alcohol/week: 6.0 standard  drinks  ?  Types: 6 Cans of beer per week  ?  Comment: none since Sept 2020  ? Drug use: Yes  ?  Types: Marijuana  ?  Comment: last smoked 03-01-21  ? Sexual activity: Yes  ?  Birth control/protection: Condom  ?Other Topics Concern  ? Not on file  ?Social History Narrative  ? Pt lives with sister.  ? Patient drinks 2 cups of caffeine daily.  ? Patient is right handed.  ? ?Social Determinants of Health  ? ?Financial Resource Strain: Not on file  ?Food Insecurity: Not on file  ?Transportation Needs: Not on file  ?Physical Activity: Not on file  ?Stress: Not on file  ?Social Connections: Not on file  ? ?Family  History  ?Problem Relation Age of Onset  ? Heart disease Mother   ? Stroke Mother   ? Epilepsy Mother   ? Heart failure Mother   ? Cancer Father   ? Heart disease Father   ? Hypertension Father   ? Melanoma Father   ? Migraines Father   ? Colon cancer Sister 17  ? Colon cancer Cousin   ? Cancer Cousin   ? Colon polyps Neg Hx   ? Esophageal cancer Neg Hx   ? Rectal cancer Neg Hx   ? Stomach cancer Neg Hx   ? ?Allergies  ?Allergen Reactions  ? Sertraline Other (See Comments)  ?  Erectile dysfunction  ? Codeine Nausea And Vomiting  ? Depakote [Divalproex Sodium] Other (See Comments)  ?  Made patient shake  ? Migranal [Dihydroergotamine] Nausea And Vomiting  ? Topamax [Topiramate] Other (See Comments)  ?  Made patient angry  ? ?Prior to Admission medications   ?Medication Sig Start Date End Date Taking? Authorizing Provider  ?albuterol (VENTOLIN HFA) 108 (90 Base) MCG/ACT inhaler INHALE 2 PUFFS BY MOUTH INTO THE LUNGS EVERY 4 HOURS AS NEEDED FOR SHORTNESS OF BREATH OR WHEEZING 03/19/21  Yes Elsie Stain, MD  ?aspirin EC 81 MG tablet Take 81 mg by mouth daily. Swallow whole.   Yes [provider]  ?budesonide-formoterol (SYMBICORT) 160-4.5 MCG/ACT inhaler Inhale 2 puffs into the lungs 2 (two) times daily. 03/19/21  Yes Elsie Stain, MD  ?levETIRAcetam (KEPPRA) 1000 MG tablet Take 1 tablet (1,000 mg total) by mouth 2 (two) times daily. 03/04/22  Yes Suzzanne Cloud, NP  ?potassium chloride SA (KLOR-CON M) 20 MEQ tablet Take 1 tablet (20 mEq total) by mouth daily. 03/05/22  Yes Jeanie Sewer, NP  ?rosuvastatin (CRESTOR) 10 MG tablet Take 1 tablet (10 mg total) by mouth daily. 02/28/22 02/23/23 Yes Loel Dubonnet, NP  ?traZODone (DESYREL) 100 MG tablet Take 1-2 tablets (100-200 mg total) by mouth at bedtime. 03/07/22  Yes Jeanie Sewer, NP  ?botulinum toxin Type A (BOTOX) 100 units SOLR injection Inject 155 units every three months. 03/05/22   Suzzanne Cloud, NP  ? ? ?ROS: All other systems have been  reviewed and were otherwise negative with the exception of those mentioned in the HPI and as above. ? ?Physical Exam: ?General: Alert, no acute distress ?Cardiovascular: No pedal edema ?Respiratory: No cyanosis, no use of accessory musculature ?GI: No organomegaly, abdomen is soft and non-tender ?Skin: No lesions in the area of chief complaint ?Neurologic: Sensation intact distally ?Psychiatric: Patient is competent for consent with normal mood and affect ?Lymphatic: No axillary or cervical lymphadenopathy ? ?MUSCULOSKELETAL:  ?RUE: unable to perform ROM due to patient pain. The axillary nerve does appear to be  firing. Incision is benign.  ? ?Imaging: ?MRI demonstrates a full thickness repeat tear of the supraspinatus. ? ?BMI: ?Body mass index is 20.81 kg/m?. ? ?Lab Results  ?Component Value Date  ? ALBUMIN 4.5 02/26/2022  ? ?Diabetes: ?Patient does not have a diagnosis of diabetes. ?Lab Results  ?Component Value Date  ? HGBA1C 4.8 08/31/2021  ? ?  ?Smoking Status: ?Social History  ? ?Tobacco Use  ?Smoking Status Every Day  ? Packs/day: 0.25  ? Years: 15.00  ? Pack years: 3.75  ? Types: Cigarettes  ?Smokeless Tobacco Never  ?Tobacco Comments  ? States he quit 02-25-22  ? ?Ready to quit: Not Answered ?Counseling given: Not Answered ?Tobacco comments: States he quit 02-25-22  ?The patient has participated in a 4-week cessation program.  ? ? ? ? ?Assessment: ?djd right shoulder ? ?Plan: ?Plan for Procedure(s): ?REVERSE SHOULDER ARTHROPLASTY ? ? ?The risks benefits and alternatives were discussed with the patient including but not limited to the risks of nonoperative treatment, versus surgical intervention including infection, bleeding, nerve injury,  blood clots, cardiopulmonary complications, morbidity, mortality, among others, and they were willing to proceed.  ? ?We additionally specifically discussed risks of axillary nerve injury, infection, periprosthetic fracture, continued pain and longevity of implants prior to  beginning procedure.  ?  ?Patient will be closely monitored in PACU for medical stabilization and pain control. If found stable in PACU, patient may be discharged home with outpatient follow-up. If any concerns re

## 2022-03-25 ENCOUNTER — Other Ambulatory Visit: Payer: Self-pay | Admitting: Critical Care Medicine

## 2022-03-25 ENCOUNTER — Telehealth: Payer: Self-pay | Admitting: Family

## 2022-03-25 ENCOUNTER — Other Ambulatory Visit: Payer: Self-pay | Admitting: Family

## 2022-03-25 ENCOUNTER — Other Ambulatory Visit: Payer: Self-pay

## 2022-03-25 MED ORDER — BUDESONIDE-FORMOTEROL FUMARATE 160-4.5 MCG/ACT IN AERO: 2.0000 | INHALATION_SPRAY | Freq: Two times a day (BID) | RESPIRATORY_TRACT | 5 refills | Status: DC

## 2022-03-25 NOTE — Telephone Encounter (Signed)
I called and spoke with pt and pharmacy. RX sent.  ?

## 2022-03-25 NOTE — Telephone Encounter (Signed)
Pt states the original prescriptions were from previous PCP. ? ?Pt states shoulder surgery is scheduled for Thursday and will not be available to come in for an appointment for three weeks after, if one is required. Pt states he will run out before he can drive again. ? ?.. ?Encourage patient to contact the pharmacy for refills or they can request refills through [facility] ? ?LAST APPOINTMENT DATE:   ?03/07/22 ? ?NEXT APPOINTMENT DATE: ?06/06/22 ? ?MEDICATION: ?budesonide-formoterol (SYMBICORT) 160-4.5 MCG/ACT inhaler [[id number]]  ? ?albuterol (VENTOLIN HFA) 108 (90 Base) MCG/ACT inhaler [[id number]]  ? ?Is the patient out of medication?  ?Will run out while recuperating at home after shoulder surgery on Thursday ? ?PHARMACY: ?[facility] [phone] - [place], [address]  ?[place], [place] [address]  ?Phone:  [phone]  Fax:  [phone] ? ? ? ?Let patient know to contact pharmacy at the end of the day to make sure medication is ready. ? ?Please notify patient to allow 48-72 hours to process  ?

## 2022-03-26 NOTE — Discharge Instructions (Addendum)
Ophelia Charter MD, MPH ?Noemi Chapel, PA-C ?[facility] ?[address] ?[phone] (tel)   ?[phone] (fax) ? ? ?POST-OPERATIVE INSTRUCTIONS - TOTAL SHOULDER REPLACEMENT  ? ? ?WOUND CARE ?You may leave the operative dressing in place until your follow-up appointment. ?KEEP THE INCISIONS CLEAN AND DRY. ?There may be a small amount of fluid/bleeding leaking at the surgical site. This is normal after surgery.  ?If it fills with liquid or blood please call us immediately to change it for you. ?Use the provided ice machine or Ice packs as often as possible for the first 3-4 days, then as needed for pain relief.   ?Keep a layer of cloth or a shirt between your skin and the cooling unit to prevent frost bite as it can get very cold. ? ?SHOWERING: ?- You may shower on Post-Op Day #2.  ?- The dressing is water resistant but do not scrub it as it may start to peel up.   ?- You may remove the sling for showering ?- Gently pat the area dry.  ?- Do not soak the shoulder in water. Do not go swimming in the pool or ocean until your incision has completely healed (about 4-6 weeks after surgery) ?- KEEP THE INCISIONS CLEAN AND DRY. ? ?EXERCISES ?Wear the sling at all times ?You may remove the sling for showering, but keep the arm across the chest or in a secondary sling.    ?Accidental/Purposeful External Rotation and shoulder flexion (reaching behind you) is to be avoided at all costs for the first month. ?It is ok to come out of your sling if your are sitting and have assistance for eating.   ?Do not lift anything heavier than 1 pound until we discuss it further in clinic. ? ?REGIONAL ANESTHESIA (NERVE BLOCKS) ?The anesthesia team may have performed a nerve block for you this is a great tool used to minimize pain.   ?The block may start wearing off overnight (between 8-24 hours postop) ?When the block wears off, your pain may go from nearly zero to the pain you would have had postop without the  block. ?This is an abrupt transition but nothing dangerous is happening.   ?This can be a challenging period but utilize your as needed pain medications to try and manage this period. ?We suggest you use the pain medication the first night prior to going to bed, to ease this transition.  ?You may take an extra dose of narcotic when this happens if needed ? ? ?POST-OP MEDICATIONS- Multimodal approach to pain control ?In general your pain will be controlled with a combination of substances.  Prescriptions unless otherwise discussed are electronically sent to your pharmacy.  This is a carefully made plan we use to minimize narcotic use.    ? ?Meloxicam - this is an anti-inflammatory, take this medication on a scheduled basis ?Acetaminophen - Non-narcotic pain medicine taken on a scheduled basis  ?Oxycodone - This is a strong narcotic, to be used only on an ?as needed? basis for SEVERE pain. ?Aspirin 81 mg - This medicine is used to minimize the risk of blood clots after surgery. ?Omeprazole - this is to protect your stomach while taking NSAIDs ?Zofran -  take as needed for nausea ? ?No Tylenol until after 1:00pm today. ? ?PHYSICAL THERAPY ?- You will begin physical therapy soon after surgery (unless otherwise specified) ?- Please call to set up an appointment, if you do not already have one  ?- Let our office  if there are any issues with scheduling your therapy  ?- You have a physical therapy appointment scheduled at [facility] (across the hall from our office) on Tuesday, 5/16 @ 8 am ? ?FOLLOW-UP ?If you develop a Fever (>101.5), Redness or Drainage from the surgical incision site, please call our office to arrange for an evaluation. ?Please call the office to schedule a follow-up appointment for a wound check, 7-10 days post-operatively. ? ?IF YOU HAVE ANY QUESTIONS, PLEASE FEEL FREE TO CALL OUR OFFICE. ? ?HELPFUL INFORMATION ? ?If you had a block, it will wear off between 8-24 hrs postop typically.  This is period when  your pain may go from nearly zero to the pain you would have had post-op without the block.  This is an abrupt transition but nothing dangerous is happening.  You may take an extra dose of narcotic when this happens. ? ?Your arm will be in a sling following surgery. You will be in this sling for the next 4 weeks.  ? ?You may be more comfortable sleeping in a semi-seated position the first few nights following surgery.  Keep a pillow propped under the elbow and forearm for comfort.  If you have a recliner type of chair it might be beneficial.  If not that is fine too, but it would be helpful to sleep propped up with pillows behind your operated shoulder as well under your elbow and forearm.  This will reduce pulling on the suture lines. ? ?When dressing, put your operative arm in the sleeve first.  When getting undressed, take your operative arm out last.  Loose fitting, button-down shirts are recommended. ? ?In most states it is against the law to drive while your arm is in a sling. And certainly against the law to drive while taking narcotics. ? ?You may return to work/school in the next couple of days when you feel up to it. Desk work and typing in the sling is fine. ? ?We suggest you use the pain medication the first night prior to going to bed, in order to ease any pain when the anesthesia wears off. You should avoid taking pain medications on an empty stomach as it will make you nauseous. ? ?Do not drink alcoholic beverages or take illicit drugs when taking pain medications. ? ?Pain medication may make you constipated.  Below are a few solutions to try in this order: ?Decrease the amount of pain medication if you aren?t having pain. ?Drink lots of decaffeinated fluids. ?Drink prune juice and/or each dried prunes ? ?If the first 3 don?t work start with additional solutions ?Take Colace - an over-the-counter stool softener ?Take Senokot - an over-the-counter laxative ?Take Miralax - a stronger over-the-counter  laxative ? ? ?Dental Antibiotics: ? ?In most cases prophylactic antibiotics for Dental procdeures after total joint surgery are not necessary. ? ?Exceptions are as follows: ? ?1. History of prior total joint infection ? ?2. Severely immunocompromised (Organ Transplant, cancer chemotherapy, Rheumatoid biologic ?meds such as [place]) ? ?3. Poorly controlled diabetes (A1C &gt; 8.0, blood glucose over 200) ? ?If you have one of these conditions, contact your surgeon for an antibiotic prescription, prior to your ?dental procedure. ? ? ?For more information including helpful videos and documents visit our website:  ? ?[email or web address] ? ? ? ?Post Anesthesia Home Care Instructions ? ?Activity: ?Get plenty of rest for the remainder of the day. A responsible individual must stay with you for 24 hours following the procedure.  ?For the next 24  hours, DO NOT: ?-Drive a car ?-Operate machinery ?-Drink alcoholic beverages ?-Take any medication unless instructed by your physician ?-Make any legal decisions or sign important papers. ? ?Meals: ?Start with liquid foods such as gelatin or soup. Progress to regular foods as tolerated. Avoid greasy, spicy, heavy foods. If nausea and/or vomiting occur, drink only clear liquids until the nausea and/or vomiting subsides. Call your physician if vomiting continues. ? ?Special Instructions/Symptoms: ?Your throat may feel dry or sore from the anesthesia or the breathing tube placed in your throat during surgery. If this causes discomfort, gargle with warm salt water. The discomfort should disappear within 24 hours. ? ?If you had a scopolamine patch placed behind your ear for the management of post- operative nausea and/or vomiting: ? ?1. The medication in the patch is effective for 72 hours, after which it should be removed.  Wrap patch in a tissue and discard in the trash. Wash hands thoroughly with soap and water. ?2. You may remove the patch earlier than  72 hours if you experience unpleasant side effects which may include dry mouth, dizziness or visual disturbances. ?3. Avoid touching the patch. Wash your hands with soap and water after contact with th

## 2022-03-26 NOTE — Anesthesia Preprocedure Evaluation (Addendum)
Anesthesia Evaluation  ?Patient identified by MRN, date of birth, ID band ?Patient awake ? ? ? ?Reviewed: ?Allergy & Precautions, NPO status , Patient's Chart, lab work & pertinent test results ? ?Airway ?Mallampati: II ? ?TM Distance: >3 FB ?Neck ROM: Full ? ? ? Dental ? ?(+) Edentulous Upper, Edentulous Lower ?  ?Pulmonary ?COPD,  COPD inhaler, Current Smoker and Patient abstained from smoking., former smoker,  ?  ?Pulmonary exam normal ? ? ? ? ? ? ? Cardiovascular ?+ CAD  ?Normal cardiovascular exam ? ? ?  ?Neuro/Psych ? Headaches, Seizures -,  PSYCHIATRIC DISORDERS Anxiety Depression Bipolar Disorder   ? GI/Hepatic ?negative GI ROS, (+)  ?  ? substance abuse ? ,   ?Endo/Other  ?negative endocrine ROS ? Renal/GU ?negative Renal ROS  ? ?  ?Musculoskeletal ? ?(+) Arthritis ,  ? Abdominal ?  ?Peds ? Hematology ?negative hematology ROS ?(+)   ?Anesthesia Other Findings ?djd right shoulder ? Reproductive/Obstetrics ? ?  ? ? ? ? ? ? ? ? ? ? ? ? ? ?  ?  ? ? ? ? ? ? ?Anesthesia Physical ?Anesthesia Plan ? ?ASA: 3 ? ?Anesthesia Plan: General and Regional  ? ?Post-op Pain Management: Regional block*  ? ?Induction: Intravenous ? ?PONV Risk Score and Plan: 1 and Ondansetron, Dexamethasone, Midazolam and Treatment may vary due to age or medical condition ? ?Airway Management Planned: Oral ETT ? ?Additional Equipment:  ? ?Intra-op Plan:  ? ?Post-operative Plan: Extubation in OR ? ?Informed Consent: I have reviewed the patients History and Physical, chart, labs and discussed the procedure including the risks, benefits and alternatives for the proposed anesthesia with the patient or authorized representative who has indicated his/her understanding and acceptance.  ? ? ? ? ? ?Plan Discussed with: CRNA ? ?Anesthesia Plan Comments:   ? ? ? ? ? ? ?Anesthesia Quick Evaluation ? ?

## 2022-03-27 ENCOUNTER — Other Ambulatory Visit: Payer: Self-pay

## 2022-03-27 ENCOUNTER — Ambulatory Visit (HOSPITAL_BASED_OUTPATIENT_CLINIC_OR_DEPARTMENT_OTHER): Payer: Medicare Other | Admitting: Anesthesiology

## 2022-03-27 ENCOUNTER — Ambulatory Visit (HOSPITAL_COMMUNITY): Payer: Medicare Other

## 2022-03-27 ENCOUNTER — Encounter (HOSPITAL_BASED_OUTPATIENT_CLINIC_OR_DEPARTMENT_OTHER)
Admission: RE | Disposition: A | Discharge status: Home / Self Care | Payer: Self-pay | Source: Ambulatory Visit | Attending: Orthopaedic Surgery

## 2022-03-27 ENCOUNTER — Ambulatory Visit (HOSPITAL_BASED_OUTPATIENT_CLINIC_OR_DEPARTMENT_OTHER)
Admission: RE | Admit: 2022-03-27 | Discharge: 2022-03-27 | Disposition: A | Discharge status: Home / Self Care | Payer: Medicare Other | Source: Ambulatory Visit | Attending: Orthopaedic Surgery | Admitting: Orthopaedic Surgery

## 2022-03-27 ENCOUNTER — Encounter (HOSPITAL_BASED_OUTPATIENT_CLINIC_OR_DEPARTMENT_OTHER): Payer: Self-pay | Admitting: Orthopaedic Surgery

## 2022-03-27 DIAGNOSIS — F319 Bipolar disorder, unspecified: Secondary | ICD-10-CM | POA: Insufficient documentation

## 2022-03-27 DIAGNOSIS — I2510 Atherosclerotic heart disease of native coronary artery without angina pectoris: Secondary | ICD-10-CM | POA: Diagnosis not present

## 2022-03-27 DIAGNOSIS — F418 Other specified anxiety disorders: Secondary | ICD-10-CM | POA: Diagnosis not present

## 2022-03-27 DIAGNOSIS — Z96611 Presence of right artificial shoulder joint: Secondary | ICD-10-CM | POA: Diagnosis not present

## 2022-03-27 DIAGNOSIS — F17210 Nicotine dependence, cigarettes, uncomplicated: Secondary | ICD-10-CM | POA: Insufficient documentation

## 2022-03-27 DIAGNOSIS — M75101 Unspecified rotator cuff tear or rupture of right shoulder, not specified as traumatic: Secondary | ICD-10-CM | POA: Diagnosis not present

## 2022-03-27 DIAGNOSIS — G8918 Other acute postprocedural pain: Secondary | ICD-10-CM | POA: Diagnosis not present

## 2022-03-27 DIAGNOSIS — J449 Chronic obstructive pulmonary disease, unspecified: Secondary | ICD-10-CM

## 2022-03-27 DIAGNOSIS — F419 Anxiety disorder, unspecified: Secondary | ICD-10-CM | POA: Insufficient documentation

## 2022-03-27 DIAGNOSIS — Z471 Aftercare following joint replacement surgery: Secondary | ICD-10-CM | POA: Diagnosis not present

## 2022-03-27 DIAGNOSIS — G40909 Epilepsy, unspecified, not intractable, without status epilepticus: Secondary | ICD-10-CM | POA: Insufficient documentation

## 2022-03-27 DIAGNOSIS — M19011 Primary osteoarthritis, right shoulder: Secondary | ICD-10-CM | POA: Diagnosis not present

## 2022-03-27 DIAGNOSIS — Z01818 Encounter for other preprocedural examination: Secondary | ICD-10-CM

## 2022-03-27 DIAGNOSIS — I25118 Atherosclerotic heart disease of native coronary artery with other forms of angina pectoris: Secondary | ICD-10-CM

## 2022-03-27 HISTORY — PX: REVERSE SHOULDER ARTHROPLASTY: SHX5054

## 2022-03-27 SURGERY — ARTHROPLASTY, SHOULDER, TOTAL, REVERSE
Anesthesia: Regional | Site: Shoulder | Laterality: Right

## 2022-03-27 MED ORDER — TRANEXAMIC ACID-NACL 1000-0.7 MG/100ML-% IV SOLN
1000.0000 mg | INTRAVENOUS | Status: AC
  Administered 2022-03-27: 1000 mg via INTRAVENOUS

## 2022-03-27 MED ORDER — LACTATED RINGERS IV SOLN: INTRAVENOUS | Status: DC

## 2022-03-27 MED ORDER — BUPIVACAINE LIPOSOME 1.3 % IJ SUSP
INTRAMUSCULAR | Status: DC | PRN
  Administered 2022-03-27: 10 mL via PERINEURAL

## 2022-03-27 MED ORDER — BUPIVACAINE HCL (PF) 0.5 % IJ SOLN
INTRAMUSCULAR | Status: DC | PRN
  Administered 2022-03-27: 15 mL via PERINEURAL

## 2022-03-27 MED ORDER — ACETAMINOPHEN 500 MG PO TABS: 1000.0000 mg | ORAL_TABLET | Freq: Three times a day (TID) | ORAL | 0 refills | Status: AC

## 2022-03-27 MED ORDER — ROCURONIUM BROMIDE 100 MG/10ML IV SOLN
INTRAVENOUS | Status: DC | PRN
  Administered 2022-03-27 (×2): 40 mg via INTRAVENOUS

## 2022-03-27 MED ORDER — PHENYLEPHRINE HCL (PRESSORS) 10 MG/ML IV SOLN
INTRAVENOUS | Status: AC
  Filled 2022-03-27: qty 1

## 2022-03-27 MED ORDER — CEFAZOLIN SODIUM-DEXTROSE 2-4 GM/100ML-% IV SOLN
2.0000 g | INTRAVENOUS | Status: AC
  Administered 2022-03-27: 2 g via INTRAVENOUS

## 2022-03-27 MED ORDER — EPHEDRINE SULFATE (PRESSORS) 50 MG/ML IJ SOLN
INTRAMUSCULAR | Status: DC | PRN
  Administered 2022-03-27 (×3): 5 mg via INTRAVENOUS

## 2022-03-27 MED ORDER — OXYCODONE HCL 5 MG PO TABS: ORAL_TABLET | ORAL | 0 refills | Status: AC

## 2022-03-27 MED ORDER — TRANEXAMIC ACID-NACL 1000-0.7 MG/100ML-% IV SOLN
INTRAVENOUS | Status: AC
Start: 2022-03-27 — End: ?
  Filled 2022-03-27: qty 100

## 2022-03-27 MED ORDER — PROPOFOL 10 MG/ML IV BOLUS
INTRAVENOUS | Status: AC
  Filled 2022-03-27: qty 20

## 2022-03-27 MED ORDER — ACETAMINOPHEN 500 MG PO TABS
ORAL_TABLET | ORAL | Status: AC
  Filled 2022-03-27: qty 2

## 2022-03-27 MED ORDER — SODIUM CHLORIDE 0.9 % IR SOLN
Status: DC | PRN
  Administered 2022-03-27: 1000 mL

## 2022-03-27 MED ORDER — VANCOMYCIN HCL 1000 MG IV SOLR
INTRAVENOUS | Status: DC | PRN
  Administered 2022-03-27: 1000 mg via TOPICAL

## 2022-03-27 MED ORDER — ACETAMINOPHEN 500 MG PO TABS
1000.0000 mg | ORAL_TABLET | Freq: Once | ORAL | Status: AC
  Administered 2022-03-27: 1000 mg via ORAL

## 2022-03-27 MED ORDER — PHENYLEPHRINE HCL-NACL 20-0.9 MG/250ML-% IV SOLN
INTRAVENOUS | Status: DC | PRN
  Administered 2022-03-27: 50 ug/min via INTRAVENOUS

## 2022-03-27 MED ORDER — FENTANYL CITRATE (PF) 100 MCG/2ML IJ SOLN
INTRAMUSCULAR | Status: AC
Start: 2022-03-27 — End: ?
  Filled 2022-03-27: qty 2

## 2022-03-27 MED ORDER — FENTANYL CITRATE (PF) 100 MCG/2ML IJ SOLN: 25.0000 ug | INTRAMUSCULAR | Status: DC | PRN

## 2022-03-27 MED ORDER — GABAPENTIN 300 MG PO CAPS
ORAL_CAPSULE | ORAL | Status: AC
Start: 2022-03-27 — End: ?
  Filled 2022-03-27: qty 1

## 2022-03-27 MED ORDER — ONDANSETRON HCL 4 MG/2ML IJ SOLN
INTRAMUSCULAR | Status: AC
  Filled 2022-03-27: qty 2

## 2022-03-27 MED ORDER — AMISULPRIDE (ANTIEMETIC) 5 MG/2ML IV SOLN: 10.0000 mg | Freq: Once | INTRAVENOUS | Status: DC | PRN

## 2022-03-27 MED ORDER — MELOXICAM 15 MG PO TABS: 15.0000 mg | ORAL_TABLET | Freq: Every day | ORAL | 0 refills | Status: DC

## 2022-03-27 MED ORDER — DEXAMETHASONE SODIUM PHOSPHATE 4 MG/ML IJ SOLN
INTRAMUSCULAR | Status: DC | PRN
  Administered 2022-03-27: 10 mg via INTRAVENOUS

## 2022-03-27 MED ORDER — MIDAZOLAM HCL 2 MG/2ML IJ SOLN
INTRAMUSCULAR | Status: AC
  Filled 2022-03-27: qty 2

## 2022-03-27 MED ORDER — MIDAZOLAM HCL 2 MG/2ML IJ SOLN
2.0000 mg | Freq: Once | INTRAMUSCULAR | Status: AC
  Administered 2022-03-27: 2 mg via INTRAVENOUS

## 2022-03-27 MED ORDER — OXYCODONE HCL 5 MG PO TABS: 5.0000 mg | ORAL_TABLET | Freq: Once | ORAL | Status: DC | PRN

## 2022-03-27 MED ORDER — PROPOFOL 10 MG/ML IV BOLUS
INTRAVENOUS | Status: DC | PRN
  Administered 2022-03-27: 150 mg via INTRAVENOUS

## 2022-03-27 MED ORDER — SUGAMMADEX SODIUM 200 MG/2ML IV SOLN
INTRAVENOUS | Status: DC | PRN
  Administered 2022-03-27: 150 mg via INTRAVENOUS

## 2022-03-27 MED ORDER — ONDANSETRON HCL 4 MG/2ML IJ SOLN
INTRAMUSCULAR | Status: DC | PRN
  Administered 2022-03-27: 4 mg via INTRAVENOUS

## 2022-03-27 MED ORDER — ASPIRIN 81 MG PO CHEW: 81.0000 mg | CHEWABLE_TABLET | Freq: Two times a day (BID) | ORAL | 0 refills | Status: AC

## 2022-03-27 MED ORDER — LIDOCAINE HCL (CARDIAC) PF 100 MG/5ML IV SOSY
PREFILLED_SYRINGE | INTRAVENOUS | Status: DC | PRN
  Administered 2022-03-27: 40 mg via INTRAVENOUS

## 2022-03-27 MED ORDER — OMEPRAZOLE 20 MG PO CPDR: 20.0000 mg | DELAYED_RELEASE_CAPSULE | Freq: Every day | ORAL | 0 refills | Status: AC

## 2022-03-27 MED ORDER — CEFAZOLIN SODIUM-DEXTROSE 2-4 GM/100ML-% IV SOLN
INTRAVENOUS | Status: AC
  Filled 2022-03-27: qty 100

## 2022-03-27 MED ORDER — OXYCODONE HCL 5 MG/5ML PO SOLN: 5.0000 mg | Freq: Once | ORAL | Status: DC | PRN

## 2022-03-27 MED ORDER — GABAPENTIN 300 MG PO CAPS
300.0000 mg | ORAL_CAPSULE | Freq: Once | ORAL | Status: AC
  Administered 2022-03-27: 300 mg via ORAL

## 2022-03-27 MED ORDER — FENTANYL CITRATE (PF) 100 MCG/2ML IJ SOLN
100.0000 ug | Freq: Once | INTRAMUSCULAR | Status: AC
  Administered 2022-03-27: 100 ug via INTRAVENOUS

## 2022-03-27 MED ORDER — MECLIZINE HCL 25 MG PO TABS: 25.0000 mg | ORAL_TABLET | Freq: Three times a day (TID) | ORAL | 0 refills | Status: DC | PRN

## 2022-03-27 MED ORDER — FENTANYL CITRATE (PF) 100 MCG/2ML IJ SOLN
INTRAMUSCULAR | Status: AC
  Filled 2022-03-27: qty 2

## 2022-03-27 SURGICAL SUPPLY — 73 items
AID PSTN UNV HD RSTRNT DISP (MISCELLANEOUS) ×1
APL PRP STRL LF DISP 70% ISPRP (MISCELLANEOUS) ×1
BIT DRILL 3.2 PERIPHERAL SCREW (BIT) ×1 IMPLANT
BLADE HEX COATED 2.75 (ELECTRODE) IMPLANT
BLADE SAW SAG 73X25 THK (BLADE) ×1
BLADE SAW SGTL 73X25 THK (BLADE) ×1 IMPLANT
BLADE SURG 10 STRL SS (BLADE) IMPLANT
BLADE SURG 15 STRL LF DISP TIS (BLADE) IMPLANT
BLADE SURG 15 STRL SS (BLADE)
BNDG COHESIVE 4X5 TAN ST LF (GAUZE/BANDAGES/DRESSINGS) IMPLANT
BRUSH SCRUB EZ PLAIN DRY (MISCELLANEOUS) ×2 IMPLANT
BSPLAT GLND +3 29 (Joint) ×1 IMPLANT
CHLORAPREP W/TINT 26 (MISCELLANEOUS) ×2 IMPLANT
CLSR STERI-STRIP ANTIMIC 1/2X4 (GAUZE/BANDAGES/DRESSINGS) ×2 IMPLANT
COOLER ICEMAN CLASSIC (MISCELLANEOUS) ×2 IMPLANT
COVER BACK TABLE 60X90IN (DRAPES) ×2 IMPLANT
COVER MAYO STAND STRL (DRAPES) ×2 IMPLANT
DRAPE IMP U-DRAPE 54X76 (DRAPES) ×2 IMPLANT
DRAPE INCISE IOBAN 66X45 STRL (DRAPES) ×2 IMPLANT
DRAPE U-SHAPE 76X120 STRL (DRAPES) ×4 IMPLANT
DRSG AQUACEL AG ADV 3.5X 6 (GAUZE/BANDAGES/DRESSINGS) ×2 IMPLANT
ELECT BLADE 4.0 EZ CLEAN MEGAD (MISCELLANEOUS) ×2
ELECT REM PT RETURN 9FT ADLT (ELECTROSURGICAL) ×2
ELECTRODE BLDE 4.0 EZ CLN MEGD (MISCELLANEOUS) ×1 IMPLANT
ELECTRODE REM PT RTRN 9FT ADLT (ELECTROSURGICAL) ×1 IMPLANT
FACESHIELD WRAPAROUND (MASK) ×4 IMPLANT
FACESHIELD WRAPAROUND OR TEAM (MASK) ×2 IMPLANT
GLENOID BASEPLATE 29 +3 (Joint) ×1 IMPLANT
GLENOSPHERE PERFORM 39 3 (Shoulder) ×2 IMPLANT
GLOVE BIO SURGEON STRL SZ 6.5 (GLOVE) ×4 IMPLANT
GLOVE BIOGEL PI IND STRL 6.5 (GLOVE) ×1 IMPLANT
GLOVE BIOGEL PI IND STRL 8 (GLOVE) ×1 IMPLANT
GLOVE BIOGEL PI INDICATOR 6.5 (GLOVE) ×1
GLOVE BIOGEL PI INDICATOR 8 (GLOVE) ×1
GLOVE ECLIPSE 8.0 STRL XLNG CF (GLOVE) ×4 IMPLANT
GOWN STRL REUS W/ TWL LRG LVL3 (GOWN DISPOSABLE) ×2 IMPLANT
GOWN STRL REUS W/TWL LRG LVL3 (GOWN DISPOSABLE) ×4
GOWN STRL REUS W/TWL XL LVL3 (GOWN DISPOSABLE) ×2 IMPLANT
GUIDE PIN 3X75 SHOULDER (PIN) ×2
GUIDEWIRE GLENOID 2.5X220 (WIRE) ×1 IMPLANT
HANDPIECE INTERPULSE COAX TIP (DISPOSABLE) ×2
INSERT HUM PERFORM 3/4 39 +6 (Insert) ×1 IMPLANT
KIT SHOULDER STAB MARCO (KITS) ×2 IMPLANT
MANIFOLD NEPTUNE II (INSTRUMENTS) ×2 IMPLANT
PACK BASIN DAY SURGERY FS (CUSTOM PROCEDURE TRAY) ×2 IMPLANT
PACK SHOULDER (CUSTOM PROCEDURE TRAY) ×2 IMPLANT
PAD COLD SHLDR WRAP-ON (PAD) ×2 IMPLANT
PAD ORTHO SHOULDER 7X19 LRG (SOFTGOODS) ×2 IMPLANT
PENCIL SMOKE EVACUATOR (MISCELLANEOUS) IMPLANT
PIN GUIDE 3X75 SHOULDER (PIN) IMPLANT
RESTRAINT HEAD UNIVERSAL NS (MISCELLANEOUS) ×2 IMPLANT
SCREW 5.5X26 (Screw) ×1 IMPLANT
SCREW BONE 6.5X40 SM (Screw) ×1 IMPLANT
SCREW PERIPHERAL 5.0X34 (Screw) ×1 IMPLANT
SET HNDPC FAN SPRY TIP SCT (DISPOSABLE) ×1 IMPLANT
SHEET MEDIUM DRAPE 40X70 STRL (DRAPES) ×2 IMPLANT
SLEEVE SCD COMPRESS KNEE MED (STOCKING) ×2 IMPLANT
SPHERE GLENOD +3X39XLATERALIZE (Shoulder) IMPLANT
SPHR GLND +3X39XLATERALIZE (Shoulder) ×1 IMPLANT
SPIKE FLUID TRANSFER (MISCELLANEOUS) IMPLANT
SPONGE T-LAP 18X18 ~~LOC~~+RFID (SPONGE) ×2 IMPLANT
STEM HUMERAL STD SHORT SZ3 (Joint) ×1 IMPLANT
SUT ETHIBOND 2 V 37 (SUTURE) ×2 IMPLANT
SUT ETHIBOND NAB CT1 #1 30IN (SUTURE) ×2 IMPLANT
SUT FIBERWIRE #5 38 CONV NDL (SUTURE) ×8
SUT MNCRL AB 4-0 PS2 18 (SUTURE) ×2 IMPLANT
SUT VIC AB 0 CT1 27 (SUTURE)
SUT VIC AB 0 CT1 27XBRD ANBCTR (SUTURE) IMPLANT
SUT VIC AB 3-0 SH 27 (SUTURE) ×2
SUT VIC AB 3-0 SH 27X BRD (SUTURE) ×1 IMPLANT
SUTURE FIBERWR #5 38 CONV NDL (SUTURE) ×4 IMPLANT
TOWEL GREEN STERILE FF (TOWEL DISPOSABLE) ×6 IMPLANT
TUBE SUCTION HIGH CAP CLEAR NV (SUCTIONS) ×2 IMPLANT

## 2022-03-27 NOTE — Anesthesia Procedure Notes (Signed)
Anesthesia Regional Block: Interscalene brachial plexus block  ? ?Pre-Anesthetic Checklist: , timeout performed,  Correct Patient, Correct Site, Correct Laterality,  Correct Procedure, Correct Position, site marked,  Risks and benefits discussed,  Surgical consent,  Pre-op evaluation,  At surgeon's request and post-op pain management ? ?Laterality: Right ? ?Prep: chloraprep     ?  ?Needles:  ?Injection technique: Single-shot ? ?Needle Type: Echogenic Stimulator Needle   ? ? ?Needle Length: 9cm  ?Needle Gauge: 21  ? ? ? ?Additional Needles: ? ? ?Procedures:,,,, ultrasound used (permanent image in chart),,    ?Narrative:  ?Start time: 03/27/2022 8:05 AM ?End time: 03/27/2022 8:15 AM ?Injection made incrementally with aspirations every 5 mL. ? ?Performed by: Personally  ?Anesthesiologist: Murvin Natal, MD ? ?Additional Notes: ?Functioning IV was confirmed and monitors were applied.  A timeout was performed. Sterile prep, hand hygiene and sterile gloves were used. A 24m 21ga Arrow echogenic stimulator needle was used. Negative aspiration and negative test dose prior to incremental administration of local anesthetic. The patient tolerated the procedure well. ? ?Ultrasound guidance: relevent anatomy identified, needle position confirmed, local anesthetic spread visualized around nerve(s), vascular puncture avoided.  Image printed for medical record.  ? ? ? ? ? ?

## 2022-03-27 NOTE — Anesthesia Procedure Notes (Signed)
Procedure Name: Intubation ?Date/Time: 03/27/2022 8:54 AM ?Performed by: Glory Buff, CRNA ?Pre-anesthesia Checklist: Patient identified, Emergency Drugs available, Suction available and Patient being monitored ?Patient Re-evaluated:Patient Re-evaluated prior to induction ?Oxygen Delivery Method: Circle system utilized ?Preoxygenation: Pre-oxygenation with 100% oxygen ?Induction Type: IV induction ?Ventilation: Mask ventilation without difficulty ?Laryngoscope Size: Sabra Heck and 3 ?Grade View: Grade I ?Tube type: Oral ?Tube size: 7.5 mm ?Number of attempts: 1 ?Airway Equipment and Method: Stylet and Oral airway ?Placement Confirmation: ETT inserted through vocal cords under direct vision, positive ETCO2 and breath sounds checked- equal and bilateral ?Secured at: 21 cm ?Tube secured with: Tape ?Dental Injury: Teeth and Oropharynx as per pre-operative assessment  ? ? ? ? ?

## 2022-03-27 NOTE — Anesthesia Postprocedure Evaluation (Signed)
Anesthesia Post Note ? ?Patient: Spencer Mendoza ? ?Procedure(s) Performed: REVERSE SHOULDER ARTHROPLASTY (Right: Shoulder) ? ?  ? ?Patient location during evaluation: PACU ?Anesthesia Type: Regional and General ?Level of consciousness: awake ?Pain management: pain level controlled ?Vital Signs Assessment: post-procedure vital signs reviewed and stable ?Respiratory status: spontaneous breathing, nonlabored ventilation, respiratory function stable and patient connected to nasal cannula oxygen ?Cardiovascular status: blood pressure returned to baseline and stable ?Postop Assessment: no apparent nausea or vomiting ?Anesthetic complications: no ? ? ?No notable events documented. ? ?Last Vitals:  ?Vitals:  ? 03/27/22 1030 03/27/22 1050  ?BP: 121/70 121/76  ?Pulse: 78 76  ?Resp: (!) 23 16  ?Temp:  36.5 ?C  ?SpO2: 95% 93%  ?  ?Last Pain:  ?Vitals:  ? 03/27/22 1050  ?TempSrc:   ?PainSc: 0-No pain  ? ? ?  ?  ?  ?  ?  ?  ? ?Delmus Warwick P Kamala Kolton ? ? ? ? ?

## 2022-03-27 NOTE — Interval H&P Note (Signed)
All questions answered, patient wants to proceed with procedure. ? ?

## 2022-03-27 NOTE — Op Note (Signed)
Orthopaedic Surgery Operative Note (CSN: [id number]) ? ?Hodge Stachnik Kelso  January 16, 1961 ?Date of Surgery: 03/27/2022 ? ? ?Diagnoses:  ?Recurrent rotator cuff tear right shoulder ? ?Procedure: ?Right reverse lateralized total Shoulder Arthroplasty ?  ?Operative Finding ?Successful completion of planned procedure.  Unfortunately patient's previous cuff repair had failed after his recent seizure.  His subscapularis was intact but his superior cuff and completely retorn.  We removed multiple sutures and anchors.  We had good fixation with our implants.  We aired toward slightly tight with the patient's implants to avoid potential dislocation with another seizure.  That said he was still appropriately tensioned. ? ?Post-operative plan: The patient will be NWB in sling.  The patient will be will be discharged from PACU if continues to be stable as was plan prior to surgery.  DVT prophylaxis Aspirin 81 mg twice daily for 6 weeks.  Pain control with PRN pain medication preferring oral medicines.  Follow up plan will be scheduled in approximately 7 days for incision check and XR.  Physical therapy to start immediately. ? ?Implants: Tornier size 3 perform humeral stem, +6 polyethylene, 39+3 glenosphere with a 29+3 baseplate and a 40 center screw with 2 peripheral locking screws ? ?Post-Op Diagnosis: Same ?Surgeons:Primary: Hiram Gash, MD ?Assistants:Caroline McBane PA-C ?Location: [facility] OR ROOM 1 ?Anesthesia: General with Exparel Interscalene ?Antibiotics: Ancef 2g preop, Vancomycin '1000mg'$  locally ?Tourniquet time: None ?Estimated Blood Loss: 100 ?Complications: None ?Specimens: None ?Implants: ?Implant Name Type Inv. Item Serial No. Manufacturer Lot No. LRB No. Used Action  ?GLENOID BASEPLATE 29 +3 - [id number] Joint GLENOID BASEPLATE 29 +3 [id number] [employer]  Right 1 Implanted  ?GLENOSPHERE PERFORM 39 3 - [id number] Shoulder GLENOSPHERE PERFORM 39 3 [id number] [employer]  Right 1 Implanted  ?SCREW BONE 6.5X40 SM - [id number]  Screw SCREW BONE 6.5X40 SM  [employer]  Right 1 Implanted  ?SCREW PERIPHERAL 5.0X34 - [id number] Screw SCREW PERIPHERAL 5.0X34  [employer]  Right 1 Implanted  ?SCREW 5.5X26 - [id number] Screw SCREW 5.5X26  [employer]  Right 1 Implanted  ?STEM HUMERAL STD SHORT SZ3 [id number] Joint STEM HUMERAL STD SHORT SZ3 [id number] IBB  Right 1 Implanted  ?INSERT HUM PERFORM 3/4 39 +6 - [id number] Insert INSERT HUM PERFORM 3/4 39 +6 [id number] [employer]  Right 1 Implanted  ? ? ?Indications for Surgery:   ?Spencer Mendoza is a 61 y.o. male with previous cuff repair with a seizure soon after causing a recurrent tear of the cuff.  Benefits and risks of operative and nonoperative management were discussed prior to surgery with patient/guardian(s) and informed consent form was completed.  Infection and need for further surgery were discussed as was prosthetic stability and cuff issues.  We additionally specifically discussed risks of axillary nerve injury, infection, periprosthetic fracture, continued pain and longevity of implants prior to beginning procedure.   ? ? ? ?Procedure:   ?The patient was identified in the preoperative holding area where the surgical site was marked. Block placed by anesthesia with exparel.  The patient was taken to the OR where a procedural timeout was called and the above noted anesthesia was induced.  The patient was positioned beachchair on allen table with spider arm positioner.  Preoperative antibiotics were dosed.  The patient's right shoulder was prepped and draped in the usual sterile fashion.  A second preoperative timeout was called.     ? ? ?Standard deltopectoral approach was performed with a #10 blade. We dissected down to the subcutaneous tissues  and the cephalic vein was taken laterally with the deltoid. Clavipectoral fascia was incised in line with the incision. Deep retractors were placed. The long of the biceps tendon was identified and there was significant tenosynovitis  present.  Tenodesis was performed to the pectoralis tendon with #2 Ethibond. The remaining biceps was followed up into the rotator interval where it was released.  ? ?The subscapularis was taken down in a full thickness layer with capsule along the humeral neck extending inferiorly around the humeral head. We continued releasing the capsule directly off of the osteophytes inferiorly all the way around the corner. This allowed [place] to dislocate the humeral head.  ? ? ?The rotator cuff was carefully examined and noted to be irreperably torn.  The decision was confirmed that a reverse total shoulder was indicated for this patient. ? ?There were osteophytes along the inferior humeral neck. The osteophytes were removed with an osteotome and a rongeur.  Osteophytes were removed with a rongeur and an osteotome and the anatomic neck was well visualized.    ? ?A humeral cutting guide was used extra medullary with a pin to help control version. The version was set at 20? of retroversion. Humeral osteotomy was performed with an oscillating saw. The head fragment was passed off the back table.  A cut protector plate was placed. ? ?Multiple sutures and anchors were removed from the previous cuff repair. ? ?The subscapularis was again identified and immediately we took care to palpate the axillary nerve anteriorly and verify its position with gentle palpation as well as the tug test.  We then released the SGHL with bovie cautery prior to placing a curved mayo at the junction of the anterior glenoid well above the axillary nerve and bluntly dissecting the subscapularis from the capsule.  We then carefully protected the axillary nerve as we gently released the inferior capsule to fully mobilize the subscapularis.  An anterior deltoid retractor was then placed as well as a small Hohmann retractor superiorly. ? ? ?The glenoid was relatively intact in the setting of an irreparable cuff tear ? ?The remaining labrum was removed  circumferentially taking great care not to disrupt the posterior capsule.  ? ?The glenoid drill guide was placed and used to drill a guide pin in the center, inferior position. The glenoid face was then reamed concentrically over the guide wire. The center hole was drilled over the guidepin in a near anatomic angle of version. Next the glenoid vault was drilled back to a depth of 40 mm.  We tapped and then placed a 3m size baseplate with additional 34mlateralization was selected with a 6.5 mm x 40 mm length central screw.  The base plate was screwed into the glenoid vault obtaining secure fixation. We next placed superior and inferior locking screws for additional fixation.  Next a 39 3 mm glenosphere was selected and impacted onto the baseplate. The center screw was tightened. ? ?We turned attention back to the humeral side. The cut protector was removed.  We used the perform humeral sizing block to select the appropriate size which for this patient was a 3.  We then placed our center pin and reamed over it concentrically obtaining appropriate inset.  We then used our lateralizing chisel to prepare the lateral aspect of the humerus.  At that point we selected the appropriate implant trialing a 3.  Using this trial implant we trialed multiple polyethylene sizes settling on a +6 which provided good stability and range of motion  without excess soft tissue tension. The offset was dialed in to match the normal anatomy. The shoulder was trialed.  There was good ROM in all planes and the shoulder was stable with no inferior translation. ? ?The real humeral implants were opened after again confirming sizes.  The trial was removed. #5 Fiberwire x4 sutures passed through the humeral neck for subscap repair. The humeral component was press-fit obtaining a secure fit. The joint was reduced and thoroughly irrigated with pulsatile lavage. Subscap was repaired back with #5 Fiberwire sutures through bone tunnels. Hemostasis was  obtained. The deltopectoral interval was reapproximated with #1 Ethibond. The subcutaneous tissues were closed with 2-0 Vicryl and the skin was closed with running monocryl.   ? ?The wounds were cleaned and drie

## 2022-03-27 NOTE — Transfer of Care (Signed)
Immediate Anesthesia Transfer of Care Note ? ?Patient: Spencer Mendoza ? ?Procedure(s) Performed: REVERSE SHOULDER ARTHROPLASTY (Right: Shoulder) ? ?Patient Location: PACU ? ?Anesthesia Type:General and Regional ? ?Level of Consciousness: drowsy, patient cooperative and responds to stimulation ? ?Airway & Oxygen Therapy: Patient Spontanous Breathing and Patient connected to face mask oxygen ? ?Post-op Assessment: Report given to RN and Post -op Vital signs reviewed and stable ? ?Post vital signs: Reviewed and stable ? ?Last Vitals:  ?Vitals Value Taken Time  ?BP    ?Temp    ?Pulse 79 03/27/22 1009  ?Resp 15 03/27/22 1009  ?SpO2 99 % 03/27/22 1009  ?Vitals shown include unvalidated device data. ? ?Last Pain:  ?Vitals:  ? 03/27/22 0700  ?TempSrc: Oral  ?PainSc: 7   ?   ? ?Patients Stated Pain Goal: 5 (03/27/22 0700) ? ?Complications: No notable events documented. ?

## 2022-03-27 NOTE — Progress Notes (Signed)
Assisted Dr. Roanna Banning with right, interscalene  block. Side rails up, monitors on throughout procedure. See vital signs in flow sheet. Tolerated Procedure well. ?

## 2022-03-28 ENCOUNTER — Encounter (HOSPITAL_BASED_OUTPATIENT_CLINIC_OR_DEPARTMENT_OTHER): Payer: Self-pay | Admitting: Orthopaedic Surgery

## 2022-03-28 ENCOUNTER — Telehealth: Payer: Self-pay | Admitting: Family

## 2022-03-28 ENCOUNTER — Other Ambulatory Visit: Payer: Self-pay

## 2022-03-28 DIAGNOSIS — J449 Chronic obstructive pulmonary disease, unspecified: Secondary | ICD-10-CM

## 2022-03-28 MED ORDER — ALBUTEROL SULFATE HFA 108 (90 BASE) MCG/ACT IN AERS: INHALATION_SPRAY | RESPIRATORY_TRACT | 5 refills | Status: DC

## 2022-03-28 NOTE — Telephone Encounter (Signed)
Patient states Ventolin was denied.  Looks like script went to Dr. Joya Gaskins for refill.  Patient states he no longer is with Dr. Joya Gaskins.   Please follow up with patient in regard at [phone].

## 2022-03-28 NOTE — Telephone Encounter (Signed)
this is ok to refill and please call and let him know- ?1 inhaler with 5 refills. thanks

## 2022-03-28 NOTE — Telephone Encounter (Signed)
Pt states his rx was denied again by [facility]. He states Dr Joya Gaskins was his previous provider. He states there should be no reason for it to be denied. He is asking for someone to call him back ?

## 2022-03-28 NOTE — Telephone Encounter (Signed)
Spoke with pt, RX sent.  ?

## 2022-04-01 DIAGNOSIS — M19011 Primary osteoarthritis, right shoulder: Secondary | ICD-10-CM | POA: Diagnosis not present

## 2022-04-03 ENCOUNTER — Ambulatory Visit: Payer: Medicare Other | Admitting: Cardiology

## 2022-04-08 DIAGNOSIS — M25611 Stiffness of right shoulder, not elsewhere classified: Secondary | ICD-10-CM | POA: Diagnosis not present

## 2022-04-08 DIAGNOSIS — M19011 Primary osteoarthritis, right shoulder: Secondary | ICD-10-CM | POA: Diagnosis not present

## 2022-04-08 DIAGNOSIS — M6281 Muscle weakness (generalized): Secondary | ICD-10-CM | POA: Diagnosis not present

## 2022-04-08 DIAGNOSIS — S46011D Strain of muscle(s) and tendon(s) of the rotator cuff of right shoulder, subsequent encounter: Secondary | ICD-10-CM | POA: Diagnosis not present

## 2022-04-16 DIAGNOSIS — M25611 Stiffness of right shoulder, not elsewhere classified: Secondary | ICD-10-CM | POA: Diagnosis not present

## 2022-04-16 DIAGNOSIS — M19011 Primary osteoarthritis, right shoulder: Secondary | ICD-10-CM | POA: Diagnosis not present

## 2022-04-16 DIAGNOSIS — M6281 Muscle weakness (generalized): Secondary | ICD-10-CM | POA: Diagnosis not present

## 2022-04-16 DIAGNOSIS — S46011D Strain of muscle(s) and tendon(s) of the rotator cuff of right shoulder, subsequent encounter: Secondary | ICD-10-CM | POA: Diagnosis not present

## 2022-04-17 NOTE — Progress Notes (Signed)
Cardiology Office Note    Date:  04/30/2022   ID:  Spencer Mendoza, DOB 1961/07/25, MRN [id number]   PCP:  Jeanie Sewer, NP   [place]  Cardiologist:  Candee Furbish, MD   Advanced Practice Provider:  No care team member to display Electrophysiologist:  None   [phone]   No chief complaint on file.   History of Present Illness:  Spencer Mendoza is a 61 y.o. male  with a hx of CAD, vasovagal syncope, PVD with stenosis of inferior mesenteric artery bilateral iliac artery and femoral artery with moderate stenosis, bipolar disorder, chronic migraine, etoh use, emphysema by CT    Cardiac CTA 03/2020 coronary calcium score 330 placing him in 94th percentile for age and sex matched control with moderate plaque in the mid LAD and mild plaque in the RCA with normal FFR. ZIO June 2021 predominantly NSR with rare junctional tachycardia, occasional SVT/PAT.  Patient was last seen by Ms. Gilford Rile, NP 02/28/22 for surgical clearance. Rosuvastatin was resumed as he had run out.  Patient comes in for early f/u. Complains of leg weakness and pain. Gets better when he starts walking but mostly upper thigh pain. No cramps with walking. Not much exercise since shoulder replacement. Smoking 2 cigarettes daily. No chest pain or dyspnea.    Past Medical History:  Diagnosis Date   Abdominal pain 08/30/2019   Alcohol withdrawal (Spencer Mendoza) 07/29/2013   Allergy    Anxiety    Arthritis    Benzodiazepine abuse ([place]) 08/30/2019   Bipolar disorder (Spencer Mendoza)    CAD (coronary artery disease)    Chronic insomnia 06/06/2015   Chronic migraine without aura, with intractable migraine, so stated, with status migrainosus 08/25/2019   Claustrophobia    Cocaine abuse (Spencer Mendoza) 05/22/2015   COPD (chronic obstructive pulmonary disease) ([place])    Delirium tremens (Spencer Mendoza) 07/29/2013   Epilepsy ([place])    last seizure week of 06-25-2020- sev. grand mal seizures per pt    Hemiplegic migraine with status  migrainosus 06/06/2015   Hemiplegic migraine with status migrainosus 06/06/2015   Migraine    Opioid use disorder 08/30/2019   PONV (postoperative nausea and vomiting)    Stroke [facility])    patient denies- states had a hemiplegic migraine in ~2011   Substance abuse ([place])     Past Surgical History:  Procedure Laterality Date   ANKLE SURGERY     in high school   APPENDECTOMY     COLONOSCOPY     KNEE ARTHROSCOPY Right    NOSE SURGERY     REVERSE SHOULDER ARTHROPLASTY Right 03/27/2022   Procedure: REVERSE SHOULDER ARTHROPLASTY;  Surgeon: Hiram Gash, MD;  Location: [place];  Service: Orthopedics;  Laterality: Right;   SHOULDER ARTHROSCOPY WITH DISTAL CLAVICLE RESECTION Right 12/04/2021   Procedure: SHOULDER ARTHROSCOPY WITH DISTAL CLAVICLE EXCISION;  Surgeon: Hiram Gash, MD;  Location: [place];  Service: Orthopedics;  Laterality: Right;   SHOULDER ARTHROSCOPY WITH SUBACROMIAL DECOMPRESSION, ROTATOR CUFF REPAIR AND BICEP TENDON REPAIR Right 12/04/2021   Procedure: SHOULDER ARTHROSCOPY WITH SUBACROMIAL DECOMPRESSION, ROTATOR CUFF REPAIR AND BICEP TENDON REPAIR;  Surgeon: Hiram Gash, MD;  Location: [place];  Service: Orthopedics;  Laterality: Right;    Current Medications: Current Meds  Medication Sig   albuterol (VENTOLIN HFA) 108 (90 Base) MCG/ACT inhaler INHALE 2 PUFFS BY MOUTH INTO THE LUNGS EVERY 4 HOURS AS NEEDED FOR SHORTNESS OF BREATH OR WHEEZING   aspirin (  ASPIRIN CHILDRENS) 81 MG chewable tablet Chew 1 tablet (81 mg total) by mouth 2 (two) times daily. For 6 weeks for DVT prophylaxis after surgery   botulinum toxin Type A (BOTOX) 100 units SOLR injection Inject 155 units every three months.   budesonide-formoterol (SYMBICORT) 160-4.5 MCG/ACT inhaler Inhale 2 puffs into the lungs 2 (two) times daily.   diphenhydramine-acetaminophen (TYLENOL PM) 25-500 MG TABS tablet Take 1 tablet by mouth at bedtime as needed.   levETIRAcetam  (KEPPRA) 1000 MG tablet Take 1 tablet (1,000 mg total) by mouth 2 (two) times daily.   meclizine (ANTIVERT) 25 MG tablet Take 1 tablet (25 mg total) by mouth every 8 (eight) hours as needed for nausea.   potassium chloride SA (KLOR-CON M) 20 MEQ tablet Take 1 tablet (20 mEq total) by mouth daily.   rosuvastatin (CRESTOR) 10 MG tablet Take 1 tablet (10 mg total) by mouth daily.   traZODone (DESYREL) 100 MG tablet Take 1-2 tablets (100-200 mg total) by mouth at bedtime.   [DISCONTINUED] meloxicam (MOBIC) 15 MG tablet Take 1 tablet (15 mg total) by mouth daily. For 2 weeks for pain and inflammation. Then take as needed     Allergies:   Sertraline, Codeine, Depakote [divalproex sodium], Migranal [dihydroergotamine], and Topamax [topiramate]   Social History   Socioeconomic History   Marital status: Legally Separated    Spouse name: Not on file   Number of children: 0   Years of education: GED   Highest education level: Not on file  Occupational History   Occupation: disabled  Tobacco Use   Smoking status: Every Day    Packs/day: 0.25    Years: 15.00    Total pack years: 3.75    Types: Cigarettes   Smokeless tobacco: Never   Tobacco comments:    States he quit 02-25-22  Vaping Use   Vaping Use: Never used  Substance and Sexual Activity   Alcohol use: Not Currently    Alcohol/week: 6.0 standard drinks of alcohol    Types: 6 Cans of beer per week    Comment: none since Sept 2020   Drug use: Yes    Types: Marijuana    Comment: last smoked 03-01-21   Sexual activity: Yes    Birth control/protection: Condom  Other Topics Concern   Not on file  Social History Narrative   Pt lives with sister.   Patient drinks 2 cups of caffeine daily.   Patient is right handed.   Social Determinants of Health   Financial Resource Strain: Not on file  Food Insecurity: Not on file  Transportation Needs: Not on file  Physical Activity: Not on file  Stress: Not on file  Social Connections: Not  on file     Family History:  The patient's  family history includes Cancer in his cousin and father; Colon cancer in his cousin; Colon cancer (age of onset: 62) in his sister; Epilepsy in his mother; Heart disease in his father and mother; Heart failure in his mother; Hypertension in his father; Melanoma in his father; Migraines in his father; Stroke in his mother.   ROS:   Please see the history of present illness.    ROS All other systems reviewed and are negative.   PHYSICAL EXAM:   VS:  BP 110/70   Pulse (!) 56   Ht 6' (1.829 m)   Wt 141 lb 12.8 oz (64.3 kg)   SpO2 100%   BMI 19.23 kg/m   Physical Exam  GEN:  Thin, in no acute distress  Neck: no JVD, carotid bruits, or masses Cardiac:RRR; no murmurs, rubs, or gallops  Respiratory:  decrease breath sounds but clear to auscultation bilaterally, normal work of breathing GI: soft, nontender, nondistended, + BS Ext: without cyanosis, clubbing, or edema, Good femoral and  distal pulses bilaterally Neuro:  Alert and Oriented x 3 Psych: euthymic mood, full affect  Wt Readings from Last 3 Encounters:  04/30/22 141 lb 12.8 oz (64.3 kg)  03/27/22 149 lb 4 oz (67.7 kg)  03/07/22 153 lb 8 oz (69.6 kg)      Studies/Labs Reviewed:   EKG:  EKG is not ordered today.    Recent Labs: 08/26/2021: Magnesium 1.9 08/28/2021: TSH 1.898 02/26/2022: ALT 15; Hemoglobin 15.3; Platelets 332.0 03/11/2022: BUN 10; Creatinine, Ser 1.01; Potassium 5.1; Sodium 134   Lipid Panel    Component Value Date/Time   CHOL 142 08/28/2021 1819   CHOL 163 07/13/2020 0921   TRIG 91 08/28/2021 1819   HDL 66 08/28/2021 1819   HDL 101 07/13/2020 0921   CHOLHDL 2.2 08/28/2021 1819   VLDL 18 08/28/2021 1819   LDLCALC 58 08/28/2021 1819   LDLCALC 50 07/13/2020 0921    Additional studies/ records that were reviewed today include:  ZIO monitor 04/23/2020:    Sinus rhythm  Rare junctional rhythm tachycardia  Occasoinal SVT, Paroxysmal atrial  tachycardia.  No AFIB  No pauses   No adverse arrhythmias   Coronary CT scan 03/29/2020: 2. Coronary calcium score of 330. This was 94th percentile for age and sex matched control.   2.  Normal coronary origin with right dominance.   3. Moderate plaque in the mid LAD with mild plaque in the RCA. Given the motion artifact this study is not a good candidate for FFRct.   4. Recommend aggressive risk factor modification including high potency statin. If symptoms persist, would have a low threshold to consider cardiac catheterization.   1. Normal FFR through coronary arteries (lowest FFR 0.90 in distal segments).   IMPRESSION: Normal FFR. No evidence of flow limiting CAD. Continue with medical management.       Risk Assessment/Calculations:         ASSESSMENT:    1. Abdominal aortic atherosclerosis (HCC) with 50% occlusion distal aorta   2. Coronary artery disease involving native coronary artery of native heart without angina pectoris   3. Hyperlipidemia LDL goal <70   4. Vasovagal syncope   5. Essential hypertension   6. PAT (paroxysmal atrial tachycardia) ([place])   7. COPD with chronic bronchitis ([place])   8. Tobacco use      PLAN:  In order of problems listed above:  PVD stenosis of inf mesenteric art, bilateral iliac art and femoral art with mod stenosis on CT angio 05/21/21. Now with worsening upper thigh pain and weakness. Pulses good. Smoking cessation necessary. On statin. Will order lower ext art dopplers and refer to Dr. Fletcher Anon  CAD Cardiac CTA 03/2020 coronary calcium score 330 placing him in 94th percentile for age and sex matched control with moderate plaque in the mid LAD and mild plaque in the RCA with normal FFR. No angina  HLD on rosuvastatin  History of vasovagal syncope  HTN well controlled  SVT/PAT on Zio 04/2020 no symptoms  Emphysema by CT  Tobacco abuse-smoking cessation discussed  Shared Decision Making/Informed Consent         Medication Adjustments/Labs and Tests Ordered: Current medicines are reviewed at length with the patient today.  Concerns  regarding medicines are outlined above.  Medication changes, Labs and Tests ordered today are listed in the Patient Instructions below. Patient Instructions  Medication Instructions:  Your physician recommends that you continue on your current medications as directed. Please refer to the Current Medication list given to you today.  *If you need a refill on your cardiac medications before your next appointment, please call your pharmacy*   Lab Work: None If you have labs (blood work) drawn today and your tests are completely normal, you will receive your results only by: [place] (if you have MyChart) OR A paper copy in the mail If you have any lab test that is abnormal or we need to change your treatment, we will call you to review the results.   Testing/Procedures: Your physician has requested that you have an ankle brachial index (ABI). During this test an ultrasound and blood pressure cuff are used to evaluate the arteries that supply the arms and legs with blood. Allow thirty minutes for this exam. There are no restrictions or special instructions.  Your physician has requested that you have a lower extremity venous duplex. This test is an ultrasound of the veins in the legs or arms. It looks at venous blood flow that carries blood from the heart to the legs or arms. Allow one hour for a Lower Venous exam. Allow thirty minutes for an Upper Venous exam. There are no restrictions or special instructions.    Follow-Up: At [facility], you and your health needs are our priority.  As part of our continuing mission to provide you with exceptional heart care, we have created designated Provider Care Teams.  These Care Teams include your primary Cardiologist (physician) and Advanced Practice Providers (APPs -  Physician Assistants and Nurse Practitioners) who  all work together to provide you with the care you need, when you need it.   Your next appointment:   After testing  The format for your next appointment:   In Person  Provider:   Kathlyn Sacramento, MD      Signed, Ermalinda Barrios, PA-C  04/30/2022 8:18 AM    [facility] [address], [place], [place]  [address] Phone: [phone]; Fax: [phone]

## 2022-04-23 NOTE — Telephone Encounter (Signed)
Faxed signed PA form with OV notes to [employer].  

## 2022-04-23 NOTE — Telephone Encounter (Signed)
Pt has new insurance, completed [employer] B[employer] PA form, placed in Nurse Pod for NP signature.

## 2022-04-28 NOTE — Telephone Encounter (Signed)
Received approval from [place], [place] # [id number] (04/23/2022-11/16/2022).

## 2022-04-29 ENCOUNTER — Ambulatory Visit: Payer: Medicare Other | Admitting: Neurology

## 2022-04-29 DIAGNOSIS — M19011 Primary osteoarthritis, right shoulder: Secondary | ICD-10-CM | POA: Diagnosis not present

## 2022-04-30 ENCOUNTER — Ambulatory Visit: Payer: Medicare HMO | Admitting: Physician Assistant

## 2022-04-30 ENCOUNTER — Encounter: Payer: Self-pay | Admitting: Physician Assistant

## 2022-04-30 ENCOUNTER — Other Ambulatory Visit: Payer: Self-pay | Admitting: Physician Assistant

## 2022-04-30 VITALS — BP 110/70 | HR 56 | Ht 72.0 in | Wt 141.8 lb

## 2022-04-30 DIAGNOSIS — I471 Supraventricular tachycardia: Secondary | ICD-10-CM | POA: Diagnosis not present

## 2022-04-30 DIAGNOSIS — E785 Hyperlipidemia, unspecified: Secondary | ICD-10-CM | POA: Diagnosis not present

## 2022-04-30 DIAGNOSIS — I10 Essential (primary) hypertension: Secondary | ICD-10-CM

## 2022-04-30 DIAGNOSIS — I2510 Atherosclerotic heart disease of native coronary artery without angina pectoris: Secondary | ICD-10-CM | POA: Diagnosis not present

## 2022-04-30 DIAGNOSIS — I739 Peripheral vascular disease, unspecified: Secondary | ICD-10-CM

## 2022-04-30 DIAGNOSIS — Z720 Tobacco use: Secondary | ICD-10-CM

## 2022-04-30 DIAGNOSIS — J449 Chronic obstructive pulmonary disease, unspecified: Secondary | ICD-10-CM

## 2022-04-30 DIAGNOSIS — R55 Syncope and collapse: Secondary | ICD-10-CM

## 2022-04-30 DIAGNOSIS — I700 Atherosclerosis of aorta: Secondary | ICD-10-CM

## 2022-04-30 NOTE — Patient Instructions (Signed)
Medication Instructions:  Your physician recommends that you continue on your current medications as directed. Please refer to the Current Medication list given to you today.  *If you need a refill on your cardiac medications before your next appointment, please call your pharmacy*   Lab Work: None If you have labs (blood work) drawn today and your tests are completely normal, you will receive your results only by: [place] (if you have MyChart) OR A paper copy in the mail If you have any lab test that is abnormal or we need to change your treatment, we will call you to review the results.   Testing/Procedures: Your physician has requested that you have an ankle brachial index (ABI). During this test an ultrasound and blood pressure cuff are used to evaluate the arteries that supply the arms and legs with blood. Allow thirty minutes for this exam. There are no restrictions or special instructions.  Your physician has requested that you have a lower extremity venous duplex. This test is an ultrasound of the veins in the legs or arms. It looks at venous blood flow that carries blood from the heart to the legs or arms. Allow one hour for a Lower Venous exam. Allow thirty minutes for an Upper Venous exam. There are no restrictions or special instructions.    Follow-Up: At [facility], you and your health needs are our priority.  As part of our continuing mission to provide you with exceptional heart care, we have created designated Provider Care Teams.  These Care Teams include your primary Cardiologist (physician) and Advanced Practice Providers (APPs -  Physician Assistants and Nurse Practitioners) who all work together to provide you with the care you need, when you need it.   Your next appointment:   After testing  The format for your next appointment:   In Person  Provider:   Kathlyn Sacramento, MD

## 2022-05-01 ENCOUNTER — Other Ambulatory Visit: Payer: Self-pay

## 2022-05-01 ENCOUNTER — Emergency Department (HOSPITAL_COMMUNITY)
Admission: EM | Admit: 2022-05-01 | Discharge: 2022-05-01 | Disposition: A | Discharge status: Home / Self Care | Payer: Medicare HMO | Attending: Emergency Medicine | Admitting: Emergency Medicine

## 2022-05-01 ENCOUNTER — Encounter (HOSPITAL_COMMUNITY): Payer: Self-pay

## 2022-05-01 ENCOUNTER — Emergency Department (HOSPITAL_COMMUNITY): Payer: Medicare HMO

## 2022-05-01 DIAGNOSIS — Z7982 Long term (current) use of aspirin: Secondary | ICD-10-CM | POA: Insufficient documentation

## 2022-05-01 DIAGNOSIS — R197 Diarrhea, unspecified: Secondary | ICD-10-CM | POA: Diagnosis not present

## 2022-05-01 DIAGNOSIS — E860 Dehydration: Secondary | ICD-10-CM | POA: Diagnosis not present

## 2022-05-01 DIAGNOSIS — R112 Nausea with vomiting, unspecified: Secondary | ICD-10-CM

## 2022-05-01 DIAGNOSIS — R1084 Generalized abdominal pain: Secondary | ICD-10-CM | POA: Diagnosis not present

## 2022-05-01 DIAGNOSIS — R109 Unspecified abdominal pain: Secondary | ICD-10-CM | POA: Diagnosis not present

## 2022-05-01 DIAGNOSIS — I2510 Atherosclerotic heart disease of native coronary artery without angina pectoris: Secondary | ICD-10-CM | POA: Insufficient documentation

## 2022-05-01 DIAGNOSIS — R000 Tachycardia, unspecified: Secondary | ICD-10-CM | POA: Insufficient documentation

## 2022-05-01 LAB — CBC
HCT: 42.1 % (ref 39.0–52.0)
Hemoglobin: 14.8 g/dL (ref 13.0–17.0)
MCH: 33.2 pg (ref 26.0–34.0)
MCHC: 35.2 g/dL (ref 30.0–36.0)
MCV: 94.4 fL (ref 80.0–100.0)
Platelets: 328 10*3/uL (ref 150–400)
RBC: 4.46 MIL/uL (ref 4.22–5.81)
RDW: 12 % (ref 11.5–15.5)
WBC: 7.4 10*3/uL (ref 4.0–10.5)
nRBC: 0 % (ref 0.0–0.2)

## 2022-05-01 LAB — URINALYSIS, ROUTINE W REFLEX MICROSCOPIC
Bilirubin Urine: NEGATIVE
Glucose, UA: NEGATIVE mg/dL
Hgb urine dipstick: NEGATIVE
Ketones, ur: 20 mg/dL — AB
Leukocytes,Ua: NEGATIVE
Nitrite: NEGATIVE
Protein, ur: NEGATIVE mg/dL
Specific Gravity, Urine: 1.027 (ref 1.005–1.030)
pH: 7 (ref 5.0–8.0)

## 2022-05-01 LAB — COMPREHENSIVE METABOLIC PANEL
ALT: 21 U/L (ref 0–44)
AST: 28 U/L (ref 15–41)
Albumin: 4.6 g/dL (ref 3.5–5.0)
Alkaline Phosphatase: 85 U/L (ref 38–126)
Anion gap: 14 (ref 5–15)
BUN: 8 mg/dL (ref 8–23)
CO2: 22 mmol/L (ref 22–32)
Calcium: 9.7 mg/dL (ref 8.9–10.3)
Chloride: 101 mmol/L (ref 98–111)
Creatinine, Ser: 0.94 mg/dL (ref 0.61–1.24)
GFR, Estimated: >60 mL/min (ref >60)
Glucose, Bld: 105 mg/dL — ABNORMAL HIGH (ref 70–99)
Potassium: 3.3 mmol/L — ABNORMAL LOW (ref 3.5–5.1)
Sodium: 137 mmol/L (ref 135–145)
Total Bilirubin: 1.2 mg/dL (ref 0.3–1.2)
Total Protein: 7.5 g/dL (ref 6.5–8.1)

## 2022-05-01 LAB — LIPASE, BLOOD: Lipase: 30 U/L (ref 11–51)

## 2022-05-01 MED ORDER — DROPERIDOL 2.5 MG/ML IJ SOLN
2.5000 mg | Freq: Once | INTRAMUSCULAR | Status: AC
  Administered 2022-05-01: 2.5 mg via INTRAVENOUS
  Filled 2022-05-01: qty 2

## 2022-05-01 MED ORDER — METOCLOPRAMIDE HCL 10 MG PO TABS: 10.0000 mg | ORAL_TABLET | Freq: Four times a day (QID) | ORAL | 0 refills | Status: DC | PRN

## 2022-05-01 MED ORDER — IOHEXOL 300 MG/ML  SOLN
100.0000 mL | Freq: Once | INTRAMUSCULAR | Status: AC | PRN
  Administered 2022-05-01: 100 mL via INTRAVENOUS

## 2022-05-01 MED ORDER — LORAZEPAM 2 MG/ML IJ SOLN
1.0000 mg | Freq: Once | INTRAMUSCULAR | Status: AC
  Administered 2022-05-01: 1 mg via INTRAVENOUS
  Filled 2022-05-01: qty 1

## 2022-05-01 MED ORDER — LACTATED RINGERS IV BOLUS
1000.0000 mL | Freq: Once | INTRAVENOUS | Status: AC
  Administered 2022-05-01: 1000 mL via INTRAVENOUS

## 2022-05-01 MED ORDER — METOCLOPRAMIDE HCL 10 MG PO TABS: 10.0000 mg | ORAL_TABLET | Freq: Four times a day (QID) | ORAL | 0 refills | Status: DC

## 2022-05-01 MED ORDER — METOCLOPRAMIDE HCL 5 MG/ML IJ SOLN
10.0000 mg | Freq: Once | INTRAMUSCULAR | Status: AC
  Administered 2022-05-01: 10 mg via INTRAVENOUS
  Filled 2022-05-01: qty 2

## 2022-05-01 NOTE — Discharge Instructions (Signed)
The symptoms today are problems related discontinuing the oxy you were taking.  The labs today looked good and your CAT scan was normal.  You should stick with a bland diet and small frequent meals until you are feeling better.  You were given a prescription for nausea medication however if you symtpoms worsen and you can't hold anything down or you start running a fever or other concerns return to the ER.

## 2022-05-01 NOTE — ED Triage Notes (Signed)
Patient reports that he has had  generalized abdominal pain, n/v/d x 10 days.

## 2022-05-01 NOTE — ED Provider Notes (Signed)
[facility] DEPT Provider Note   CSN: [id number] Arrival date & time: 05/01/22  0859     History  Chief Complaint  Patient presents with   Abdominal Pain   Emesis   Diarrhea    Spencer Mendoza is a 61 y.o. male.  Pt is a 61y/o male with hx of CAD, vasovagal syncope, PVD with stenosis of inferior mesenteric artery, bilateral iliac artery and femoral artery with moderate stenosis, bipolar disorder, chronic migraine, polysubstance use in the past but denying alcohol now and ran out of oxycodone a few weeks ago that he had been on since jan for shoulder that he recently had surgery on and emphysema who is presenting today with complaints of nausea vomiting diarrhea and abdominal pain for the last 10 days.  Patient reports he is vomiting more than 10 times a day which is usually dry heaving now or yellow.  Also having diarrhea which he reports its like water.  Diffuse abdominal pain and cramping.  Patient has felt hot and cold chills but is unaware if he has had a fever.  He has not had significant cough, congestion or shortness of breath.  He reports he is urinating very little but it does burn slightly when he urinates.  Denies prior history of abdominal surgeries.  Does not have history of chronic abdominal pain.  The history is provided by the patient.  Abdominal Pain Associated symptoms: diarrhea and vomiting   Emesis Associated symptoms: abdominal pain and diarrhea   Diarrhea Associated symptoms: abdominal pain and vomiting        Home Medications Prior to Admission medications   Medication Sig Start Date End Date Taking? Authorizing Provider  acetaminophen (TYLENOL) 500 MG tablet Take 1,000 mg by mouth 2 (two) times daily as needed (pain).   Yes [provider]  albuterol (VENTOLIN HFA) 108 (90 Base) MCG/ACT inhaler INHALE 2 PUFFS BY MOUTH INTO THE LUNGS EVERY 4 HOURS AS NEEDED FOR SHORTNESS OF BREATH OR WHEEZING Patient taking  differently: Inhale 2 puffs into the lungs as needed for wheezing or shortness of breath. 03/28/22  Yes Jeanie Sewer, NP  aspirin (ASPIRIN CHILDRENS) 81 MG chewable tablet Chew 1 tablet (81 mg total) by mouth 2 (two) times daily. For 6 weeks for DVT prophylaxis after surgery Patient taking differently: Chew 81 mg by mouth daily. 03/27/22 05/08/22 Yes McBane, Maylene Roes, PA-C  budesonide-formoterol (SYMBICORT) 160-4.5 MCG/ACT inhaler Inhale 2 puffs into the lungs 2 (two) times daily. 03/25/22  Yes Hudnell, [name], NP  diphenhydramine-acetaminophen (TYLENOL PM) 25-500 MG TABS tablet Take 2 tablets by mouth at bedtime as needed.   Yes [provider]  levETIRAcetam (KEPPRA) 1000 MG tablet Take 1 tablet (1,000 mg total) by mouth 2 (two) times daily. 03/04/22  Yes Suzzanne Cloud, NP  metoCLOPramide (REGLAN) 10 MG tablet Take 1 tablet (10 mg total) by mouth every 6 (six) hours. 05/01/22  Yes Eugena Rhue, Loree Fee, MD  rosuvastatin (CRESTOR) 10 MG tablet Take 1 tablet (10 mg total) by mouth daily. 02/28/22 02/23/23 Yes Loel Dubonnet, NP  traZODone (DESYREL) 100 MG tablet Take 1-2 tablets (100-200 mg total) by mouth at bedtime. Patient taking differently: Take 200 mg by mouth at bedtime. 03/07/22  Yes Jeanie Sewer, NP  botulinum toxin Type A (BOTOX) 100 units SOLR injection Inject 155 units every three months. Patient not taking: Reported on 05/01/2022 03/05/22   Suzzanne Cloud, NP  meclizine (ANTIVERT) 25 MG tablet Take 1 tablet (25 mg total)  by mouth every 8 (eight) hours as needed for nausea. Patient not taking: Reported on 05/01/2022 03/27/22   Ethelda Chick, PA-C  potassium chloride SA (KLOR-CON M) 20 MEQ tablet Take 1 tablet (20 mEq total) by mouth daily. Patient not taking: Reported on 05/01/2022 03/05/22   Jeanie Sewer, NP      Allergies    Sertraline, Codeine, Depakote [divalproex sodium], Migranal [dihydroergotamine], and Topamax [topiramate]    Review of Systems   Review of  Systems  Gastrointestinal:  Positive for abdominal pain, diarrhea and vomiting.    Physical Exam Updated Vital Signs BP 127/87 Comment: Simultaneous filing. User may not have seen previous data.  Pulse 84 Comment: Simultaneous filing. User may not have seen previous data.  Temp 97.8 F (36.6 C) (Oral)   Resp 20 Comment: Simultaneous filing. User may not have seen previous data.  Ht 6' (1.829 m)   Wt 64.3 kg   SpO2 94% Comment: Simultaneous filing. User may not have seen previous data.  BMI 19.23 kg/m  Physical Exam Vitals and nursing note reviewed.  Constitutional:      General: He is not in acute distress.    Appearance: He is well-developed.  HENT:     Head: Normocephalic and atraumatic.     Mouth/Throat:     Mouth: Mucous membranes are dry.  Eyes:     Conjunctiva/sclera: Conjunctivae normal.     Pupils: Pupils are equal, round, and reactive to light.  Cardiovascular:     Rate and Rhythm: Regular rhythm. Tachycardia present.     Heart sounds: No murmur heard. Pulmonary:     Effort: Pulmonary effort is normal. No respiratory distress.     Breath sounds: Normal breath sounds. No wheezing or rales.  Abdominal:     General: There is no distension.     Palpations: Abdomen is soft.     Tenderness: There is generalized abdominal tenderness. There is guarding. There is no rebound.  Musculoskeletal:        General: No tenderness. Normal range of motion.     Cervical back: Normal range of motion and neck supple.     Right lower leg: No edema.     Left lower leg: No edema.  Skin:    General: Skin is warm and dry.     Findings: No erythema or rash.  Neurological:     Mental Status: He is alert and oriented to person, place, and time. Mental status is at baseline.  Psychiatric:        Mood and Affect: Mood normal.        Behavior: Behavior normal.     ED Results / Procedures / Treatments   Labs (all labs ordered are listed, but only abnormal results are displayed) Labs  Reviewed  COMPREHENSIVE METABOLIC PANEL - Abnormal; Notable for the following components:      Result Value   Potassium 3.3 (*)    Glucose, Bld 105 (*)    All other components within normal limits  URINALYSIS, ROUTINE W REFLEX MICROSCOPIC - Abnormal; Notable for the following components:   APPearance HAZY (*)    Ketones, ur 20 (*)    All other components within normal limits  LIPASE, BLOOD  CBC    EKG EKG Interpretation  Date/Time:  Thursday May 01 2022 11:45:22 EDT Ventricular Rate:  76 PR Interval:  204 QRS Duration: 102 QT Interval:  396 QTC Calculation: 446 R Axis:   130 Text Interpretation: Sinus rhythm S1,S2,S3 pattern Low voltage,  precordial leads Probable anteroseptal infarct, old 12 Lead; Mason-Likar No significant change since last tracing Confirmed by Blanchie Dessert [phone]) on 05/01/2022 11:52:20 AM  Radiology CT ABDOMEN PELVIS W CONTRAST  Result Date: 05/01/2022 CLINICAL DATA:  Abdominal pain, acute, nonlocalized EXAM: CT ABDOMEN AND PELVIS WITH CONTRAST TECHNIQUE: Multidetector CT imaging of the abdomen and pelvis was performed using the standard protocol following bolus administration of intravenous contrast. RADIATION DOSE REDUCTION: This exam was performed according to the departmental dose-optimization program which includes automated exposure control, adjustment of the mA and/or kV according to patient size and/or use of iterative reconstruction technique. CONTRAST:  125m OMNIPAQUE IOHEXOL 300 MG/ML  SOLN COMPARISON:  CT 05/21/2021 FINDINGS: Lower chest: There are subacute-chronic anterior fifth and sixth rib fractures. Lung bases are clear. Hepatobiliary: There is an unchanged 1.2 cm hypodensity in the posterior right hepatic lobe, similar to prior exam and likely a small cyst or hemangioma (series 2, image 13). The gallbladder is unremarkable. Pancreas: Unremarkable. No pancreatic ductal dilatation or surrounding inflammatory changes. Spleen: Normal in size  without focal abnormality. Adrenals/Urinary Tract: Adrenal glands are unremarkable. No hydronephrosis or nephrolithiasis. Bladder is unremarkable. Stomach/Bowel: The stomach is within normal limits. There is no evidence of bowel obstruction.Prior appendectomy. Scattered chronic diverticula. Vascular/Lymphatic: Aortoiliac atherosclerosis. No AAA. No lymphadenopathy. Unchanged moderate left renal artery stenosis and IMA ostial stenosis. Reproductive: Unremarkable. Other: No abdominal wall hernia or abnormality. No abdominopelvic ascites. Musculoskeletal: Subacute-chronic anterior fifth and sixth rib fractures. No suspicious osseous lesion. Degenerative disc disease at L4-L5. Mild bilateral hip osteoarthritis. IMPRESSION: No acute findings in the abdomen or pelvis. Subacute-chronic anterior fifth and sixth rib fractures, new since July 2022. Electronically Signed   By: JMaurine SimmeringM.D.   On: 05/01/2022 12:29    Procedures Procedures    Medications Ordered in ED Medications  lactated ringers bolus 1,000 mL (1,000 mLs Intravenous Bolus from Bag 05/01/22 0943)  LORazepam (ATIVAN) injection 1 mg (1 mg Intravenous Given 05/01/22 0941)  metoCLOPramide (REGLAN) injection 10 mg (10 mg Intravenous Given 05/01/22 1215)  iohexol (OMNIPAQUE) 300 MG/ML solution 100 mL (100 mLs Intravenous Contrast Given 05/01/22 1153)  droperidol (INAPSINE) 2.5 MG/ML injection 2.5 mg (2.5 mg Intravenous Given 05/01/22 1350)    ED Course/ Medical Decision Making/ A&P                           Medical Decision Making Amount and/or Complexity of Data Reviewed External Data Reviewed: notes.    Details: cards visit Labs: ordered. Decision-making details documented in ED Course. Radiology: ordered and independent interpretation performed. Decision-making details documented in ED Course. ECG/medicine tests: ordered and independent interpretation performed. Decision-making details documented in ED Course.  Risk Prescription drug  management. Parenteral controlled substances.   Pt with multiple medical problems and comorbidities and presenting today with a complaint that caries a high risk for morbidity and mortality. Presenting today with n/v/d and abd pain for now 10 days.  Pt did recently stop opiates which he had been prescribed since jan for shoulder pain which he recently had replaced which could be cause of sx, however concern for pancreatitis, hepatitis, cholecystitis as patient does have a history of prior alcohol use.  Also concern for PUD given recent surgery.  Patient however does deny any history of liver disease or cirrhosis.  Also concern for possible mesenteric ischemia as patient has known stenosis of inferior mesenteric artery.  Lower suspicion for acute cardiac cause of his symptoms or  pneumonia.  Patient given IV fluids and nausea control.  EKG, labs are pending.  Patient was seen yesterday in the cardiology office for follow-up and at that time had normal labs and did not mention his nausea vomiting and diarrhea for the last 10 days.  Today patient is hypertensive and borderline tachycardic.  He reports he only takes Keppra, trazadone and cholesterol medication at home but has not been able to hold those medications down.  He does however deny any breakthrough seizures.  3:09 PM I independently interpreted pt's labs and CBC, lipase wnl.  CMP with minimal hypokalemia of 3.3.  3:09 PM Pt still having nausea and was given reglan.  I have independently visualized and interpreted pt's images today. CT abd pelvis without obstruction or diverticulitis.  Radiology reports neg except for subacute/chronic rib fractures but not the cause of pt's sx today.  Will attempt po challenge.  I independently interpreted EKG today without acute finding.  Blood pressure and HR improved with fluids and nausea control.  3:09 PM Pt still having nausea.  Discussed findings with the pt and signficant other.  Will try droperidol.  EKG  with normal QT today.  3:09 PM Pt able to tolerate po's.  Will d/c home.  No indication for admission at this time        Final Clinical Impression(s) / ED Diagnoses Final diagnoses:  Dehydration  Nausea vomiting and diarrhea    Rx / DC Orders ED Discharge Orders          Ordered    metoCLOPramide (REGLAN) 10 MG tablet  Every 6 hours        05/01/22 1329              Blanchie Dessert, MD 05/01/22 1510

## 2022-05-05 ENCOUNTER — Other Ambulatory Visit: Payer: Self-pay | Admitting: Physician Assistant

## 2022-05-05 ENCOUNTER — Ambulatory Visit (HOSPITAL_COMMUNITY): Payer: Medicare HMO | Attending: Cardiology

## 2022-05-05 DIAGNOSIS — I739 Peripheral vascular disease, unspecified: Secondary | ICD-10-CM

## 2022-05-14 DIAGNOSIS — M6281 Muscle weakness (generalized): Secondary | ICD-10-CM | POA: Diagnosis not present

## 2022-05-14 DIAGNOSIS — S46011D Strain of muscle(s) and tendon(s) of the rotator cuff of right shoulder, subsequent encounter: Secondary | ICD-10-CM | POA: Diagnosis not present

## 2022-05-14 DIAGNOSIS — M25611 Stiffness of right shoulder, not elsewhere classified: Secondary | ICD-10-CM | POA: Diagnosis not present

## 2022-05-14 DIAGNOSIS — M19011 Primary osteoarthritis, right shoulder: Secondary | ICD-10-CM | POA: Diagnosis not present

## 2022-05-16 DIAGNOSIS — M19011 Primary osteoarthritis, right shoulder: Secondary | ICD-10-CM | POA: Diagnosis not present

## 2022-05-16 DIAGNOSIS — S46011D Strain of muscle(s) and tendon(s) of the rotator cuff of right shoulder, subsequent encounter: Secondary | ICD-10-CM | POA: Diagnosis not present

## 2022-05-16 DIAGNOSIS — M6281 Muscle weakness (generalized): Secondary | ICD-10-CM | POA: Diagnosis not present

## 2022-05-16 DIAGNOSIS — M25611 Stiffness of right shoulder, not elsewhere classified: Secondary | ICD-10-CM | POA: Diagnosis not present

## 2022-06-02 NOTE — Progress Notes (Deleted)
Cardiology Office Note   Date:  06/02/2022   ID:  Spencer Mendoza, DOB July 06, 1961, MRN [id number]  PCP:  Jeanie Sewer, NP  Cardiologist:  Kathlyn Sacramento, MD   No chief complaint Mendoza file.     History of Present Illness: Spencer Mendoza is a 61 y.o. male who presents for ***    Past Medical History:  Diagnosis Date   Abdominal pain 08/30/2019   Alcohol withdrawal ([place]) 07/29/2013   Allergy    Anxiety    Arthritis    Benzodiazepine abuse ([place]) 08/30/2019   Bipolar disorder ([place])    CAD (coronary artery disease)    Chronic insomnia 06/06/2015   Chronic migraine without aura, with intractable migraine, so stated, with status migrainosus 08/25/2019   Claustrophobia    Cocaine abuse ([place]) 05/22/2015   COPD (chronic obstructive pulmonary disease) ([place])    Delirium tremens ([place]) 07/29/2013   Epilepsy ([place])    last seizure week of 06-25-2020- sev. grand mal seizures per pt    Hemiplegic migraine with status migrainosus 06/06/2015   Hemiplegic migraine with status migrainosus 06/06/2015   Migraine    Opioid use disorder 08/30/2019   PONV (postoperative nausea and vomiting)    Stroke [facility])    patient denies- states had a hemiplegic migraine in ~2011   Substance abuse ([place])     Past Surgical History:  Procedure Laterality Date   ANKLE SURGERY     in high school   APPENDECTOMY     COLONOSCOPY     KNEE ARTHROSCOPY Right    NOSE SURGERY     REVERSE SHOULDER ARTHROPLASTY Right 03/27/2022   Procedure: REVERSE SHOULDER ARTHROPLASTY;  Surgeon: Hiram Gash, MD;  Location: [place];  Service: Orthopedics;  Laterality: Right;   SHOULDER ARTHROSCOPY WITH DISTAL CLAVICLE RESECTION Right 12/04/2021   Procedure: SHOULDER ARTHROSCOPY WITH DISTAL CLAVICLE EXCISION;  Surgeon: Hiram Gash, MD;  Location: [place];  Service: Orthopedics;  Laterality: Right;   SHOULDER ARTHROSCOPY WITH SUBACROMIAL DECOMPRESSION, ROTATOR CUFF REPAIR AND  BICEP TENDON REPAIR Right 12/04/2021   Procedure: SHOULDER ARTHROSCOPY WITH SUBACROMIAL DECOMPRESSION, ROTATOR CUFF REPAIR AND BICEP TENDON REPAIR;  Surgeon: Hiram Gash, MD;  Location: [facility];  Service: Orthopedics;  Laterality: Right;     Current Outpatient Medications  Medication Sig Dispense Refill   acetaminophen (TYLENOL) 500 MG tablet Take 1,000 mg by mouth 2 (two) times daily as needed (pain).     albuterol (VENTOLIN HFA) 108 (90 Base) MCG/ACT inhaler INHALE 2 PUFFS BY MOUTH INTO THE LUNGS EVERY 4 HOURS AS NEEDED FOR SHORTNESS OF BREATH OR WHEEZING (Patient taking differently: Inhale 2 puffs into the lungs as needed for wheezing or shortness of breath.) 8.5 g 5   botulinum toxin Type A (BOTOX) 100 units SOLR injection Inject 155 units every three months. (Patient not taking: Reported Mendoza 05/01/2022) 2 each 3   budesonide-formoterol (SYMBICORT) 160-4.5 MCG/ACT inhaler Inhale 2 puffs into the lungs 2 (two) times daily. 10.2 g 5   diphenhydramine-acetaminophen (TYLENOL PM) 25-500 MG TABS tablet Take 2 tablets by mouth at bedtime as needed.     levETIRAcetam (KEPPRA) 1000 MG tablet Take 1 tablet (1,000 mg total) by mouth 2 (two) times daily. 60 tablet 11   meclizine (ANTIVERT) 25 MG tablet Take 1 tablet (25 mg total) by mouth every 8 (eight) hours as needed for nausea. (Patient not taking: Reported Mendoza 05/01/2022) 10 tablet 0   metoCLOPramide (REGLAN) 10  MG tablet Take 1 tablet (10 mg total) by mouth every 6 (six) hours as needed for nausea or vomiting. 30 tablet 0   potassium chloride SA (KLOR-CON M) 20 MEQ tablet Take 1 tablet (20 mEq total) by mouth daily. (Patient not taking: Reported Mendoza 05/01/2022) 30 tablet 3   rosuvastatin (CRESTOR) 10 MG tablet Take 1 tablet (10 mg total) by mouth daily. 90 tablet 3   traZODone (DESYREL) 100 MG tablet Take 1-2 tablets (100-200 mg total) by mouth at bedtime. (Patient taking differently: Take 200 mg by mouth at bedtime.) 60 tablet 2   No  current facility-administered medications for this visit.    Allergies:   Sertraline, Codeine, Depakote [divalproex sodium], Migranal [dihydroergotamine], and Topamax [topiramate]    Social History:  The patient  reports that he has been smoking cigarettes. He has a 3.75 pack-year smoking history. He has never used smokeless tobacco. He reports that he does not currently use alcohol after a past usage of about 6.0 standard drinks of alcohol per week. He reports current drug use. Drug: Marijuana.   Family History:  The patient's ***family history includes Cancer in his cousin and father; Colon cancer in his cousin; Colon cancer (age of onset: 108) in his sister; Epilepsy in his mother; Heart disease in his father and mother; Heart failure in his mother; Hypertension in his father; Melanoma in his father; Migraines in his father; Stroke in his mother.    ROS:  Please see the history of present illness.   Otherwise, review of systems are positive for {NONE DEFAULTED:18576}.   All other systems are reviewed and negative.    PHYSICAL EXAM: VS:  There were no vitals taken for this visit. , BMI There is no height or weight Mendoza file to calculate BMI. GEN: Well nourished, well developed, in no acute distress  HEENT: normal  Neck: no JVD, carotid bruits, or masses Cardiac: ***RRR; no murmurs, rubs, or gallops,no edema  Respiratory:  clear to auscultation bilaterally, normal work of breathing GI: soft, nontender, nondistended, + BS MS: no deformity or atrophy  Skin: warm and dry, no rash Neuro:  Strength and sensation are intact Psych: euthymic mood, full affect   EKG:  EKG {ACTION; IS/IS IRW:[id number]} ordered today. The ekg ordered today demonstrates ***   Recent Labs: 08/26/2021: Magnesium 1.9 08/28/2021: TSH 1.898 05/01/2022: ALT 21; BUN 8; Creatinine, Ser 0.94; Hemoglobin 14.8; Platelets 328; Potassium 3.3; Sodium 137    Lipid Panel    Component Value Date/Time   CHOL 142 08/28/2021  1819   CHOL 163 07/13/2020 0921   TRIG 91 08/28/2021 1819   HDL 66 08/28/2021 1819   HDL 101 07/13/2020 0921   CHOLHDL 2.2 08/28/2021 1819   VLDL 18 08/28/2021 1819   LDLCALC 58 08/28/2021 1819   LDLCALC 50 07/13/2020 0921      Wt Readings from Last 3 Encounters:  05/01/22 141 lb 12.8 oz (64.3 kg)  04/30/22 141 lb 12.8 oz (64.3 kg)  03/27/22 149 lb 4 oz (67.7 kg)      Other studies Reviewed: Additional studies/ records that were reviewed today include: ***. Review of the above records demonstrates: ***      No data to display            ASSESSMENT AND PLAN:  1.  ***    Disposition:   FU with *** in {gen number 6-76:195093} {Days to years:10300}  Signed,  Kathlyn Sacramento, MD  06/02/2022 10:46 PM    [facility]

## 2022-06-03 ENCOUNTER — Institutional Professional Consult (permissible substitution): Payer: Medicare HMO | Admitting: Cardiovascular Disease

## 2022-06-06 ENCOUNTER — Ambulatory Visit: Payer: Medicare Other | Admitting: Family

## 2022-06-06 NOTE — Progress Notes (Deleted)
Patient ID: Spencer Mendoza, male    DOB: 1961-05-08, 61 y.o.   MRN: [id number]  No chief complaint on file.   HPI: INSOMNIA:  How long: years. Difficulty initiating sleep: yes.  Difficulty maintaining sleep: yes.  OTC meds tried: no. RX meds in past: Remeron, Trazodone, Valium, Seroquel, Phenobarbitol. Sleep hygiene measures: yes. Started any new meds recently: no. Shift worker: no. New stressors: yes, having pain in his right shoulder, scheduled for surgery 5/11.   Assessment & Plan:   Problem List Items Addressed This Visit   None   Subjective:    Outpatient Medications Prior to Visit  Medication Sig Dispense Refill   acetaminophen (TYLENOL) 500 MG tablet Take 1,000 mg by mouth 2 (two) times daily as needed (pain).     albuterol (VENTOLIN HFA) 108 (90 Base) MCG/ACT inhaler INHALE 2 PUFFS BY MOUTH INTO THE LUNGS EVERY 4 HOURS AS NEEDED FOR SHORTNESS OF BREATH OR WHEEZING (Patient taking differently: Inhale 2 puffs into the lungs as needed for wheezing or shortness of breath.) 8.5 g 5   botulinum toxin Type A (BOTOX) 100 units SOLR injection Inject 155 units every three months. (Patient not taking: Reported on 05/01/2022) 2 each 3   budesonide-formoterol (SYMBICORT) 160-4.5 MCG/ACT inhaler Inhale 2 puffs into the lungs 2 (two) times daily. 10.2 g 5   diphenhydramine-acetaminophen (TYLENOL PM) 25-500 MG TABS tablet Take 2 tablets by mouth at bedtime as needed.     levETIRAcetam (KEPPRA) 1000 MG tablet Take 1 tablet (1,000 mg total) by mouth 2 (two) times daily. 60 tablet 11   meclizine (ANTIVERT) 25 MG tablet Take 1 tablet (25 mg total) by mouth every 8 (eight) hours as needed for nausea. (Patient not taking: Reported on 05/01/2022) 10 tablet 0   metoCLOPramide (REGLAN) 10 MG tablet Take 1 tablet (10 mg total) by mouth every 6 (six) hours as needed for nausea or vomiting. 30 tablet 0   potassium chloride SA (KLOR-CON M) 20 MEQ tablet Take 1 tablet (20 mEq total) by mouth daily. (Patient  not taking: Reported on 05/01/2022) 30 tablet 3   rosuvastatin (CRESTOR) 10 MG tablet Take 1 tablet (10 mg total) by mouth daily. 90 tablet 3   traZODone (DESYREL) 100 MG tablet Take 1-2 tablets (100-200 mg total) by mouth at bedtime. (Patient taking differently: Take 200 mg by mouth at bedtime.) 60 tablet 2   No facility-administered medications prior to visit.   Past Medical History:  Diagnosis Date   Abdominal pain 08/30/2019   Alcohol withdrawal ([place]) 07/29/2013   Allergy    Anxiety    Arthritis    Benzodiazepine abuse ([place]) 08/30/2019   Bipolar disorder ([place])    CAD (coronary artery disease)    Chronic insomnia 06/06/2015   Chronic migraine without aura, with intractable migraine, so stated, with status migrainosus 08/25/2019   Claustrophobia    Cocaine abuse ([place]) 05/22/2015   COPD (chronic obstructive pulmonary disease) ([place])    Delirium tremens ([place]) 07/29/2013   Epilepsy ([place])    last seizure week of 06-25-2020- sev. grand mal seizures per pt    Hemiplegic migraine with status migrainosus 06/06/2015   Hemiplegic migraine with status migrainosus 06/06/2015   Migraine    Opioid use disorder 08/30/2019   PONV (postoperative nausea and vomiting)    Stroke [facility])    patient denies- states had a hemiplegic migraine in ~2011   Substance abuse [facility])    Past Surgical History:  Procedure Laterality Date   ANKLE SURGERY  in high school   APPENDECTOMY     COLONOSCOPY     KNEE ARTHROSCOPY Right    NOSE SURGERY     REVERSE SHOULDER ARTHROPLASTY Right 03/27/2022   Procedure: REVERSE SHOULDER ARTHROPLASTY;  Surgeon: Hiram Gash, MD;  Location: [place];  Service: Orthopedics;  Laterality: Right;   SHOULDER ARTHROSCOPY WITH DISTAL CLAVICLE RESECTION Right 12/04/2021   Procedure: SHOULDER ARTHROSCOPY WITH DISTAL CLAVICLE EXCISION;  Surgeon: Hiram Gash, MD;  Location: [place];  Service: Orthopedics;  Laterality: Right;   SHOULDER ARTHROSCOPY  WITH SUBACROMIAL DECOMPRESSION, ROTATOR CUFF REPAIR AND BICEP TENDON REPAIR Right 12/04/2021   Procedure: SHOULDER ARTHROSCOPY WITH SUBACROMIAL DECOMPRESSION, ROTATOR CUFF REPAIR AND BICEP TENDON REPAIR;  Surgeon: Hiram Gash, MD;  Location: [place];  Service: Orthopedics;  Laterality: Right;   Allergies  Allergen Reactions   Sertraline Other (See Comments)    Erectile dysfunction   Codeine Nausea And Vomiting   Depakote [Divalproex Sodium] Other (See Comments)    Made patient shake   Migranal [Dihydroergotamine] Nausea And Vomiting   Topamax [Topiramate] Other (See Comments)    Made patient angry      Objective:    Physical Exam There were no vitals taken for this visit. Wt Readings from Last 3 Encounters:  05/01/22 141 lb 12.8 oz (64.3 kg)  04/30/22 141 lb 12.8 oz (64.3 kg)  03/27/22 149 lb 4 oz (67.7 kg)       Jeanie Sewer, NP

## 2022-06-11 ENCOUNTER — Other Ambulatory Visit: Payer: Self-pay | Admitting: Family

## 2022-06-11 DIAGNOSIS — F5104 Psychophysiologic insomnia: Secondary | ICD-10-CM

## 2022-06-11 NOTE — Telephone Encounter (Signed)
?  ok to refill °

## 2022-06-26 ENCOUNTER — Encounter (HOSPITAL_COMMUNITY): Payer: Self-pay

## 2022-06-26 ENCOUNTER — Emergency Department (HOSPITAL_COMMUNITY)
Admission: EM | Admit: 2022-06-26 | Discharge: 2022-06-26 | Disposition: A | Discharge status: Home / Self Care | Payer: Medicare HMO | Attending: Emergency Medicine | Admitting: Emergency Medicine

## 2022-06-26 ENCOUNTER — Emergency Department (HOSPITAL_COMMUNITY): Payer: Medicare HMO

## 2022-06-26 ENCOUNTER — Other Ambulatory Visit: Payer: Self-pay

## 2022-06-26 DIAGNOSIS — M542 Cervicalgia: Secondary | ICD-10-CM | POA: Insufficient documentation

## 2022-06-26 DIAGNOSIS — W1839XA Other fall on same level, initial encounter: Secondary | ICD-10-CM | POA: Diagnosis not present

## 2022-06-26 DIAGNOSIS — S2243XA Multiple fractures of ribs, bilateral, initial encounter for closed fracture: Secondary | ICD-10-CM

## 2022-06-26 DIAGNOSIS — S22078A Other fracture of T9-T10 vertebra, initial encounter for closed fracture: Secondary | ICD-10-CM | POA: Diagnosis not present

## 2022-06-26 DIAGNOSIS — J432 Centrilobular emphysema: Secondary | ICD-10-CM | POA: Diagnosis not present

## 2022-06-26 DIAGNOSIS — M47816 Spondylosis without myelopathy or radiculopathy, lumbar region: Secondary | ICD-10-CM | POA: Diagnosis not present

## 2022-06-26 DIAGNOSIS — M546 Pain in thoracic spine: Secondary | ICD-10-CM | POA: Diagnosis not present

## 2022-06-26 DIAGNOSIS — S2241XA Multiple fractures of ribs, right side, initial encounter for closed fracture: Secondary | ICD-10-CM | POA: Diagnosis not present

## 2022-06-26 DIAGNOSIS — I2510 Atherosclerotic heart disease of native coronary artery without angina pectoris: Secondary | ICD-10-CM | POA: Insufficient documentation

## 2022-06-26 DIAGNOSIS — R9431 Abnormal electrocardiogram [ECG] [EKG]: Secondary | ICD-10-CM | POA: Diagnosis not present

## 2022-06-26 DIAGNOSIS — I700 Atherosclerosis of aorta: Secondary | ICD-10-CM | POA: Diagnosis not present

## 2022-06-26 DIAGNOSIS — S06891A Other specified intracranial injury with loss of consciousness of 30 minutes or less, initial encounter: Secondary | ICD-10-CM | POA: Insufficient documentation

## 2022-06-26 DIAGNOSIS — S22048A Other fracture of fourth thoracic vertebra, initial encounter for closed fracture: Secondary | ICD-10-CM | POA: Diagnosis not present

## 2022-06-26 DIAGNOSIS — R519 Headache, unspecified: Secondary | ICD-10-CM | POA: Diagnosis not present

## 2022-06-26 DIAGNOSIS — S22009A Unspecified fracture of unspecified thoracic vertebra, initial encounter for closed fracture: Secondary | ICD-10-CM

## 2022-06-26 DIAGNOSIS — S3991XA Unspecified injury of abdomen, initial encounter: Secondary | ICD-10-CM | POA: Diagnosis not present

## 2022-06-26 DIAGNOSIS — M47812 Spondylosis without myelopathy or radiculopathy, cervical region: Secondary | ICD-10-CM | POA: Diagnosis not present

## 2022-06-26 DIAGNOSIS — S22079A Unspecified fracture of T9-T10 vertebra, initial encounter for closed fracture: Secondary | ICD-10-CM | POA: Diagnosis not present

## 2022-06-26 DIAGNOSIS — M25511 Pain in right shoulder: Secondary | ICD-10-CM | POA: Insufficient documentation

## 2022-06-26 DIAGNOSIS — M545 Low back pain, unspecified: Secondary | ICD-10-CM | POA: Diagnosis not present

## 2022-06-26 LAB — COMPREHENSIVE METABOLIC PANEL
ALT: 18 U/L (ref 0–44)
AST: 25 U/L (ref 15–41)
Albumin: 4.1 g/dL (ref 3.5–5.0)
Alkaline Phosphatase: 108 U/L (ref 38–126)
Anion gap: 11 (ref 5–15)
BUN: 8 mg/dL (ref 8–23)
CO2: 23 mmol/L (ref 22–32)
Calcium: 10 mg/dL (ref 8.9–10.3)
Chloride: 104 mmol/L (ref 98–111)
Creatinine, Ser: 0.8 mg/dL (ref 0.61–1.24)
GFR, Estimated: >60 mL/min (ref >60)
Glucose, Bld: 89 mg/dL (ref 70–99)
Potassium: 3.8 mmol/L (ref 3.5–5.1)
Sodium: 138 mmol/L (ref 135–145)
Total Bilirubin: 0.9 mg/dL (ref 0.3–1.2)
Total Protein: 7.4 g/dL (ref 6.5–8.1)

## 2022-06-26 LAB — URINALYSIS, ROUTINE W REFLEX MICROSCOPIC
Bilirubin Urine: NEGATIVE
Glucose, UA: NEGATIVE mg/dL
Hgb urine dipstick: NEGATIVE
Ketones, ur: 5 mg/dL — AB
Leukocytes,Ua: NEGATIVE
Nitrite: NEGATIVE
Protein, ur: NEGATIVE mg/dL
Specific Gravity, Urine: >1.046 — ABNORMAL HIGH (ref 1.005–1.030)
pH: 7 (ref 5.0–8.0)

## 2022-06-26 LAB — CBC WITH DIFFERENTIAL/PLATELET
Abs Immature Granulocytes: 0.01 10*3/uL (ref 0.00–0.07)
Basophils Absolute: 0.1 10*3/uL (ref 0.0–0.1)
Basophils Relative: 1 %
Eosinophils Absolute: 0.2 10*3/uL (ref 0.0–0.5)
Eosinophils Relative: 3 %
HCT: 42.8 % (ref 39.0–52.0)
Hemoglobin: 14.5 g/dL (ref 13.0–17.0)
Immature Granulocytes: 0 %
Lymphocytes Relative: 30 %
Lymphs Abs: 2 10*3/uL (ref 0.7–4.0)
MCH: 30.9 pg (ref 26.0–34.0)
MCHC: 33.9 g/dL (ref 30.0–36.0)
MCV: 91.1 fL (ref 80.0–100.0)
Monocytes Absolute: 0.7 10*3/uL (ref 0.1–1.0)
Monocytes Relative: 11 %
Neutro Abs: 3.8 10*3/uL (ref 1.7–7.7)
Neutrophils Relative %: 55 %
Platelets: 347 10*3/uL (ref 150–400)
RBC: 4.7 MIL/uL (ref 4.22–5.81)
RDW: 14.1 % (ref 11.5–15.5)
WBC: 6.8 10*3/uL (ref 4.0–10.5)
nRBC: 0 % (ref 0.0–0.2)

## 2022-06-26 MED ORDER — OXYCODONE-ACETAMINOPHEN 5-325 MG PO TABS
1.0000 | ORAL_TABLET | Freq: Once | ORAL | Status: AC
  Administered 2022-06-26: 1 via ORAL
  Filled 2022-06-26: qty 1

## 2022-06-26 MED ORDER — LEVETIRACETAM 1000 MG PO TABS: 1000.0000 mg | ORAL_TABLET | Freq: Two times a day (BID) | ORAL | 0 refills | Status: DC

## 2022-06-26 MED ORDER — IOHEXOL 300 MG/ML  SOLN
100.0000 mL | Freq: Once | INTRAMUSCULAR | Status: AC | PRN
  Administered 2022-06-26: 100 mL via INTRAVENOUS

## 2022-06-26 MED ORDER — HYDROMORPHONE HCL 1 MG/ML IJ SOLN
0.5000 mg | Freq: Once | INTRAMUSCULAR | Status: AC
  Administered 2022-06-26: 0.5 mg via INTRAVENOUS
  Filled 2022-06-26: qty 1

## 2022-06-26 MED ORDER — OXYCODONE-ACETAMINOPHEN 5-325 MG PO TABS: 1.0000 | ORAL_TABLET | Freq: Four times a day (QID) | ORAL | 0 refills | Status: DC | PRN

## 2022-06-26 MED ORDER — ONDANSETRON HCL 4 MG/2ML IJ SOLN
4.0000 mg | Freq: Once | INTRAMUSCULAR | Status: AC
  Administered 2022-06-26: 4 mg via INTRAVENOUS
  Filled 2022-06-26: qty 2

## 2022-06-26 MED ORDER — CYCLOBENZAPRINE HCL 10 MG PO TABS
10.0000 mg | ORAL_TABLET | Freq: Two times a day (BID) | ORAL | 0 refills | Status: DC | PRN
Start: 2022-06-26 — End: 2022-08-01

## 2022-06-26 MED ORDER — IBUPROFEN 600 MG PO TABS: 600.0000 mg | ORAL_TABLET | Freq: Four times a day (QID) | ORAL | 0 refills | Status: DC | PRN

## 2022-06-26 MED ORDER — MORPHINE SULFATE (PF) 4 MG/ML IV SOLN
4.0000 mg | Freq: Once | INTRAVENOUS | Status: AC
  Administered 2022-06-26: 4 mg via INTRAVENOUS
  Filled 2022-06-26: qty 1

## 2022-06-26 NOTE — ED Provider Notes (Signed)
Received signout from previous provider, please see his note for complete H&P.  This is a 61 year old male who was leaning against a deck railing when he fell down approximately 6 feet of height and had loss of consciousness lasting for approximately 25 minutes.  Incident happened approximately 9 days ago.  He is here with primary complaint of mid upper back pain and right shoulder pain.  He also endorsed headache and nausea.  On exam, patient does have tenderness along his mid thoracic spine as well as paraspinal muscle.  Heart and lung sounds normal.  No significant signs of head or cervical spine trauma noted.  He is mentating appropriately.  He has tenderness to his right shoulder without deformity.  Labs and imaging obtained independently viewed interpreted by me and agree with radiologist interpretation.  Electrolyte panels are reassuring, normal WBC, normal H&H.  CT scan of the head and cervical spine obtained without any acute fracture of the cervical spine all any signs of posttraumatic intracranial injury.  However, CT scan of the thoracic spine demonstrate multilevel thoracic spinal injury which include nondisplaced fracture of the left anterior articular process of T4, nondisplaced fracture through the base of the right T5 pedicle and transverse process, nondisplaced fracture through the right side pedicle and inferior articular process of T10 and the left side superior inferior articular process and posterior aspect of the left pedicle, multiple posterior right ribs fracture including nondisplaced fracture of the posterior third and fourth ribs and minimally displaced fracture of the medial posterior fifth through ninth ribs near the costovertebral junction.  X-ray of the shoulder is unremarkable  Given the significant finding of his spinal injury, will consult neurosurgery for recommendation.  Patient is overall neurovascularly intact.  CT scan of the chest abdomen pelvis with out acute  traumatic injury of the abdomen or pelvis  5:30 PM Appreciate consultation from neurosurgeon Dr. Arnoldo Morale who recommend patient to be placed on a TLSO and patient can follow-up outpatient in approximately 5 to 6 weeks for repeat x-ray to ensure adequate healing.  Definitive rib fracture care discussed with patient.  Patient will go home with pain management, incentive spirometer, and TLSO.  Return precaution discussed.  BP (!) 140/106   Pulse 81   Temp 98.2 F (36.8 C) (Oral)   Resp 18   Ht 6' (1.829 m)   Wt 65.8 kg   SpO2 98%   BMI 19.67 kg/m   Results for orders placed or performed during the hospital encounter of 06/26/22  CBC with Differential  Result Value Ref Range   WBC 6.8 4.0 - 10.5 K/uL   RBC 4.70 4.22 - 5.81 MIL/uL   Hemoglobin 14.5 13.0 - 17.0 g/dL   HCT 42.8 39.0 - 52.0 %   MCV 91.1 80.0 - 100.0 fL   MCH 30.9 26.0 - 34.0 pg   MCHC 33.9 30.0 - 36.0 g/dL   RDW 14.1 11.5 - 15.5 %   Platelets 347 150 - 400 K/uL   nRBC 0.0 0.0 - 0.2 %   Neutrophils Relative % 55 %   Neutro Abs 3.8 1.7 - 7.7 K/uL   Lymphocytes Relative 30 %   Lymphs Abs 2.0 0.7 - 4.0 K/uL   Monocytes Relative 11 %   Monocytes Absolute 0.7 0.1 - 1.0 K/uL   Eosinophils Relative 3 %   Eosinophils Absolute 0.2 0.0 - 0.5 K/uL   Basophils Relative 1 %   Basophils Absolute 0.1 0.0 - 0.1 K/uL   Immature Granulocytes 0 %  Abs Immature Granulocytes 0.01 0.00 - 0.07 K/uL  Comprehensive metabolic panel  Result Value Ref Range   Sodium 138 135 - 145 mmol/L   Potassium 3.8 3.5 - 5.1 mmol/L   Chloride 104 98 - 111 mmol/L   CO2 23 22 - 32 mmol/L   Glucose, Bld 89 70 - 99 mg/dL   BUN 8 8 - 23 mg/dL   Creatinine, Ser 0.80 0.61 - 1.24 mg/dL   Calcium 10.0 8.9 - 10.3 mg/dL   Total Protein 7.4 6.5 - 8.1 g/dL   Albumin 4.1 3.5 - 5.0 g/dL   AST 25 15 - 41 U/L   ALT 18 0 - 44 U/L   Alkaline Phosphatase 108 38 - 126 U/L   Total Bilirubin 0.9 0.3 - 1.2 mg/dL   GFR, Estimated >60 >60 mL/min   Anion gap 11 5 -  15  Urinalysis, Routine w reflex microscopic Urine, Clean Catch  Result Value Ref Range   Color, Urine YELLOW YELLOW   APPearance CLEAR CLEAR   Specific Gravity, Urine >1.046 (H) 1.005 - 1.030   pH 7.0 5.0 - 8.0   Glucose, UA NEGATIVE NEGATIVE mg/dL   Hgb urine dipstick NEGATIVE NEGATIVE   Bilirubin Urine NEGATIVE NEGATIVE   Ketones, ur 5 (A) NEGATIVE mg/dL   Protein, ur NEGATIVE NEGATIVE mg/dL   Nitrite NEGATIVE NEGATIVE   Leukocytes,Ua NEGATIVE NEGATIVE   CT L-SPINE NO CHARGE  Result Date: 06/26/2022 CLINICAL DATA:  Fall off a deck 9 days ago with pain. EXAM: CT LUMBAR SPINE WITHOUT CONTRAST TECHNIQUE: Multidetector CT imaging of the lumbar spine was performed without intravenous contrast administration. Multiplanar CT image reconstructions were also generated. RADIATION DOSE REDUCTION: This exam was performed according to the departmental dose-optimization program which includes automated exposure control, adjustment of the mA and/or kV according to patient size and/or use of iterative reconstruction technique. COMPARISON:  CT abdomen pelvis dated 05/01/2022. FINDINGS: Segmentation: 5 lumbar type vertebrae. Alignment: Normal. Vertebrae: No acute fracture or focal pathologic process. Paraspinal and other soft tissues: No acute process. Disc levels: Moderate degenerative disc disease at L4-5. IMPRESSION: No acute osseous injury in the lumbar spine. Electronically Signed   By: Zerita Boers M.D.   On: 06/26/2022 16:19   CT CHEST ABDOMEN PELVIS W CONTRAST  Result Date: 06/26/2022 CLINICAL DATA:  Fall off a deck 9 days ago. EXAM: CT CHEST, ABDOMEN, AND PELVIS WITH CONTRAST TECHNIQUE: Multidetector CT imaging of the chest, abdomen and pelvis was performed following the standard protocol during bolus administration of intravenous contrast. RADIATION DOSE REDUCTION: This exam was performed according to the departmental dose-optimization program which includes automated exposure control, adjustment of  the mA and/or kV according to patient size and/or use of iterative reconstruction technique. CONTRAST:  129m OMNIPAQUE IOHEXOL 300 MG/ML  SOLN COMPARISON:  Same day thoracic spine CT, CT abdomen pelvis dated 05/01/2022. FINDINGS: CT CHEST FINDINGS Cardiovascular: Vascular calcifications are seen in the coronary arteries and aortic arch. Normal heart size. No pericardial effusion. Mediastinum/Nodes: No enlarged mediastinal, hilar, or axillary lymph nodes. Thyroid gland, trachea, and esophagus demonstrate no significant findings. Lungs/Pleura: Centrilobular and paraseptal emphysema is noted. No pleural effusion or pneumothorax. Biapical pleuroparenchymal scarring is seen. Musculoskeletal: Subacute fractures of the anterolateral right fourth through sixth ribs and the anterolateral left third and fourth ribs are noted. Acute fractures of the posteromedial right third through ninth ribs are redemonstrated. Additional fractures involving the pedicles, articular processes, and transverse processes are better demonstrated on same day CT thoracic spine due  to differences in technique. CT ABDOMEN PELVIS FINDINGS Hepatobiliary: The previously described hypoattenuating lesion in the right hepatic lobe from 05/01/2022 is not appreciated on today's exam (possibly due to the phase of contrast). No gallstones, gallbladder wall thickening, or biliary dilatation. Pancreas: Unremarkable. No pancreatic ductal dilatation or surrounding inflammatory changes. Spleen: Normal in size without focal abnormality. Adrenals/Urinary Tract: Adrenal glands are unremarkable. Kidneys are normal, without renal calculi, focal lesion, or hydronephrosis. Bladder is unremarkable. Stomach/Bowel: Stomach is within normal limits. No evidence of bowel wall thickening, distention, or inflammatory changes. Vascular/Lymphatic: Aortic atherosclerosis. No enlarged abdominal or pelvic lymph nodes. Reproductive: Prostate is unremarkable. Other: No abdominal wall  hernia or abnormality. No abdominopelvic ascites. Musculoskeletal: Moderate degenerative changes are seen at L4-L5. No acute osseous injury in the lumbar spine. IMPRESSION: 1. Acute fractures in the thorax as previously described on same day CT thoracic spine. Subacute rib fractures are seen bilaterally, however no additional acute fractures are noted. 2. No acute traumatic injury in the abdomen or pelvis. Aortic Atherosclerosis (ICD10-I70.0) and Emphysema (ICD10-J43.9). Electronically Signed   By: Zerita Boers M.D.   On: 06/26/2022 16:18   CT Thoracic Spine Wo Contrast  Result Date: 06/26/2022 CLINICAL DATA:  Back trauma, no prior imaging (Age >= 16y) EXAM: CT THORACIC SPINE WITHOUT CONTRAST TECHNIQUE: Multidetector CT images of the thoracic were obtained using the standard protocol without intravenous contrast. RADIATION DOSE REDUCTION: This exam was performed according to the departmental dose-optimization program which includes automated exposure control, adjustment of the mA and/or kV according to patient size and/or use of iterative reconstruction technique. COMPARISON:  CT 02/28/2020. FINDINGS: Alignment: Normal. Vertebrae: Nondisplaced fracture through the left inferior articular process of T4 (series 9 images 37-38). Nondisplaced fractures through the base of the right T9 pedicle and transverse process (series 9, image 27, series 3, images 99 and 100). There is a nondisplaced fracture through the right pedicle and inferior articular process of T10 (series 9, image 28, series 3, image 114). Additional subtle nondisplaced fractures through the left superior and inferior articular processes/posterior aspect of the left pedicle (series 9, image 40). There are multiple posterior right rib fractures, including a nondisplaced fractures of the posterior third and fourth ribs, and minimally displaced fractures of the medial posterior fifth through ninth ribs near the costovertebral junction. No evidence of a  vertebral body compression fracture. There is unchanged mild depression of the T1 and T2 superior endplates in comparison to prior CT in April 2021. Unchanged mild left-sided superior endplate depression at B76 as well. Paraspinal and other soft tissues: Mild centrilobular and paraseptal emphysema. Mild posterior peripheral interstitial lung abnormality on the right. Disc levels: Mild multilevel degenerative changes. No visible impingement. IMPRESSION: Nondisplaced fracture of the left inferior articular process of T4. Nondisplaced fractures through the base of the right T9 pedicle and transverse process. Nondisplaced fractures through the right-sided pedicle and inferior articular process of T10, and of the left-sided superior and inferior articular processes/posterior aspect of the left pedicle. Multiple posterior right rib fractures, including nondisplaced fractures of the posterior third and fourth ribs, and minimally displaced fractures of the medial posterior fifth through ninth ribs near the costovertebral junction. No evidence of vertebral body compression fracture. Unchanged mild depression of the T1, T2, and T11 superior endplates in comparison prior CT in April 2021. Electronically Signed   By: Maurine Simmering M.D.   On: 06/26/2022 13:43   CT Head Wo Contrast  Result Date: 06/26/2022 CLINICAL DATA:  Provided history: Head trauma, moderate/severe.  Neck pain, acute, no red flags. EXAM: CT HEAD WITHOUT CONTRAST CT CERVICAL SPINE WITHOUT CONTRAST TECHNIQUE: Multidetector CT imaging of the head and cervical spine was performed following the standard protocol without intravenous contrast. Multiplanar CT image reconstructions of the cervical spine were also generated. RADIATION DOSE REDUCTION: This exam was performed according to the departmental dose-optimization program which includes automated exposure control, adjustment of the mA and/or kV according to patient size and/or use of iterative reconstruction  technique. COMPARISON:  Head CT 08/04/2019. FINDINGS: CT HEAD FINDINGS Brain: Mild generalized parenchymal atrophy. 7 mm rounded extra-axial dural-based focus of calcification along the high left frontoparietal lobes suspicious for a small meningioma. This is unchanged from the prior head CT of 08/04/2019. There is no acute intracranial hemorrhage. No demarcated cortical infarct. No extra-axial fluid collection. No midline shift. Vascular: No hyperdense vessel. Atherosclerotic calcifications. Skull: No fracture or aggressive osseous lesion. Sinuses/Orbits: No mass or acute finding within the imaged orbits. Minimal mucosal thickening within the left maxillary sinus. Mucosal thickening within the bilateral nasal passages and right frontoethmoidal recess. CT CERVICAL SPINE FINDINGS Alignment: Straightening of the expected cervical lordosis. No significant spondylolisthesis. Skull base and vertebrae: The basion-dental and atlanto-dental intervals are maintained.No evidence of acute fracture to the cervical spine. Soft tissues and spinal canal: No prevertebral fluid or swelling. No visible canal hematoma. Disc levels: Cervical spondylosis with multilevel disc space narrowing, disc bulges/central disc protrusions, endplate spurring, uncovertebral hypertrophy and facet arthrosis. Disc space narrowing is greatest at C5-C6 (advanced) and C6-C7 (moderate to moderately advanced). No appreciable high-grade spinal canal stenosis. Multilevel bony neural foraminal narrowing. Upper chest: No consolidation within the imaged lung apices. Biapical pleuroparenchymal scarring and paraseptal emphysema. Other: Nondisplaced acute fracture of the posterior right third rib. IMPRESSION: CT head: 1. No acute posttraumatic intracranial findings. 2. Unchanged 7 mm rounded dural-based extra-axial focus of calcification along the high left frontoparietal lobes, suspicious for a small meningioma. 3. Mild generalized parenchymal atrophy. 4.  Sinonasal disease at the imaged levels, as described. CT cervical spine: 1. No evidence of acute fracture to the cervical spine. 2. Acute nondisplaced fracture of the posterior right third rib. 3. Cervical spondylosis, as described. 4.  Emphysema (ICD10-J43.9). Electronically Signed   By: Kellie Simmering D.O.   On: 06/26/2022 13:33   CT Cervical Spine Wo Contrast  Result Date: 06/26/2022 CLINICAL DATA:  Provided history: Head trauma, moderate/severe. Neck pain, acute, no red flags. EXAM: CT HEAD WITHOUT CONTRAST CT CERVICAL SPINE WITHOUT CONTRAST TECHNIQUE: Multidetector CT imaging of the head and cervical spine was performed following the standard protocol without intravenous contrast. Multiplanar CT image reconstructions of the cervical spine were also generated. RADIATION DOSE REDUCTION: This exam was performed according to the departmental dose-optimization program which includes automated exposure control, adjustment of the mA and/or kV according to patient size and/or use of iterative reconstruction technique. COMPARISON:  Head CT 08/04/2019. FINDINGS: CT HEAD FINDINGS Brain: Mild generalized parenchymal atrophy. 7 mm rounded extra-axial dural-based focus of calcification along the high left frontoparietal lobes suspicious for a small meningioma. This is unchanged from the prior head CT of 08/04/2019. There is no acute intracranial hemorrhage. No demarcated cortical infarct. No extra-axial fluid collection. No midline shift. Vascular: No hyperdense vessel. Atherosclerotic calcifications. Skull: No fracture or aggressive osseous lesion. Sinuses/Orbits: No mass or acute finding within the imaged orbits. Minimal mucosal thickening within the left maxillary sinus. Mucosal thickening within the bilateral nasal passages and right frontoethmoidal recess. CT CERVICAL SPINE FINDINGS Alignment: Straightening of the  expected cervical lordosis. No significant spondylolisthesis. Skull base and vertebrae: The basion-dental  and atlanto-dental intervals are maintained.No evidence of acute fracture to the cervical spine. Soft tissues and spinal canal: No prevertebral fluid or swelling. No visible canal hematoma. Disc levels: Cervical spondylosis with multilevel disc space narrowing, disc bulges/central disc protrusions, endplate spurring, uncovertebral hypertrophy and facet arthrosis. Disc space narrowing is greatest at C5-C6 (advanced) and C6-C7 (moderate to moderately advanced). No appreciable high-grade spinal canal stenosis. Multilevel bony neural foraminal narrowing. Upper chest: No consolidation within the imaged lung apices. Biapical pleuroparenchymal scarring and paraseptal emphysema. Other: Nondisplaced acute fracture of the posterior right third rib. IMPRESSION: CT head: 1. No acute posttraumatic intracranial findings. 2. Unchanged 7 mm rounded dural-based extra-axial focus of calcification along the high left frontoparietal lobes, suspicious for a small meningioma. 3. Mild generalized parenchymal atrophy. 4. Sinonasal disease at the imaged levels, as described. CT cervical spine: 1. No evidence of acute fracture to the cervical spine. 2. Acute nondisplaced fracture of the posterior right third rib. 3. Cervical spondylosis, as described. 4.  Emphysema (ICD10-J43.9). Electronically Signed   By: Kellie Simmering D.O.   On: 06/26/2022 13:33   DG Shoulder Right  Result Date: 06/26/2022 CLINICAL DATA:  Right shoulder pain. Fell 6 feet and loss consciousness five days ago. EXAM: RIGHT SHOULDER - 2+ VIEW COMPARISON:  Right shoulder x-rays dated Mar 27, 2022. FINDINGS: No acute fracture or dislocation. Prior reverse total shoulder arthroplasty. No evidence of hardware failure or loosening. Soft tissues are unremarkable. IMPRESSION: 1. No acute osseous abnormality. Electronically Signed   By: Titus Dubin M.D.   On: 06/26/2022 13:08        Domenic Moras, PA-C 06/26/22 [id number]    Ernesto Rutherford, Norman Herrlich, DO 06/27/22 1456

## 2022-06-26 NOTE — Discharge Instructions (Addendum)
You have been evaluated for your injury.  CT scan shows that you have multiple rib fractures as well as multiple thoracic spine fractures.  You may wear back brace for support.  You may sleep without back brace.  Follow-up with neurosurgeon in 5 to 6 weeks for repeat x-ray to ensure proper healing.  Use incentive spirometer hourly for the next several days to decrease risk of developing pneumonia from ribs injury.  You may take medication prescribed as needed for pain.  Return if you have any concern.  Please be aware opiate pain medication can cause drowsiness.

## 2022-06-26 NOTE — Progress Notes (Signed)
Orthopedic Tech Progress Note Patient Details:  Spencer Mendoza [birth year] [id number]  Patient ID: [name], male   DOB: March 14, 1961, 61 y.o.   MRN: [id number]  [name] 06/26/2022, 5:37 PM TLSO ordered from [facility] clinic

## 2022-06-26 NOTE — ED Provider Triage Note (Signed)
Emergency Medicine Provider Triage Evaluation Note  Spencer Mendoza , a 61 y.o. male  was evaluated in triage.  Pt complains of fall.  Pt report 5 days ago he was leaning over a deck rail when it was not secured and he fell down approximately 5 ft and LOC for 25 min. Did not recall what happened and had memory loss for 2 days.  Report pain to head, neck, right shoulder and mid back.  No numbness.  Not on blood thinner.    Review of Systems  Positive: As above Negative: As above  Physical Exam  BP (!) 133/100 (BP Location: Left Arm)   Pulse 100   Temp 98 F (36.7 C) (Oral)   Resp 18   Ht 6' (1.829 m)   Wt 65.8 kg   SpO2 99%   BMI 19.67 kg/m  Gen:   Awake, no distress   Resp:  Normal effort  MSK:   Moves extremities without difficulty  Other:    Medical Decision Making  Medically screening exam initiated at 12:46 PM.  Appropriate orders placed.  Spencer Mendoza was informed that the remainder of the evaluation will be completed by another provider, this initial triage assessment does not replace that evaluation, and the importance of remaining in the ED until their evaluation is complete.     Domenic Moras, PA-C 06/26/22 [id number]

## 2022-06-26 NOTE — ED Notes (Signed)
Pt to CT

## 2022-06-26 NOTE — ED Provider Notes (Cosign Needed Addendum)
[facility] DEPT Provider Note   CSN: [id number] Arrival date & time: 06/26/22  1200     History  Chief Complaint  Patient presents with   Fall   Shoulder Pain   Back Pain    Spencer Mendoza is a 61 y.o. male.  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  Patient denies associated lightheadedness, shortness of breath, chest pain when he fell.  He thinks that he lost consciousness for about 25 minutes.  Since the fall he has had persistent headaches and pain in his upper back, especially in the area of the right scapula and mid back.  He states that he has been trying to manage his pain at home and does not salt treatment.  However, pain is now becoming more severe and making it very difficult for him to sleep.  This is why he decided to come in for evaluation today.  He denies pain in the hips or lower extremities.  No weakness, numbness, or tingling in his arms or legs.  Pain is worse with movement of the right upper extremity.  He has a history of joint replacement in the right shoulder.  Denies associated vomiting, vision change or loss.        Home Medications Prior to Admission medications   Medication Sig Start Date End Date Taking? Authorizing Provider  acetaminophen (TYLENOL) 500 MG tablet Take 1,000 mg by mouth 2 (two) times daily as needed (pain).    [provider]  albuterol (VENTOLIN HFA) 108 (90 Base) MCG/ACT inhaler INHALE 2 PUFFS BY MOUTH INTO THE LUNGS EVERY 4 HOURS AS NEEDED FOR SHORTNESS OF BREATH OR WHEEZING Patient taking differently: Inhale 2 puffs into the lungs as needed for wheezing or shortness of breath. 03/28/22    Jeanie Sewer, NP  botulinum toxin Type A (BOTOX) 100 units SOLR injection Inject 155 units every three months. Patient not taking: Reported on 05/01/2022 03/05/22   Suzzanne Cloud, NP  budesonide-formoterol [facility]) 160-4.5 MCG/ACT inhaler Inhale 2 puffs into the lungs 2 (two) times daily. 03/25/22   Jeanie Sewer, NP  diphenhydramine-acetaminophen (TYLENOL PM) 25-500 MG TABS tablet Take 2 tablets by mouth at bedtime as needed.    [provider]  levETIRAcetam (KEPPRA) 1000 MG tablet Take 1 tablet (1,000 mg total) by mouth 2 (two) times daily. 03/04/22   Suzzanne Cloud, NP  meclizine (ANTIVERT) 25 MG tablet Take 1 tablet (25 mg total) by mouth every 8 (eight) hours as needed for nausea. Patient not taking: Reported on 05/01/2022 03/27/22   Ethelda Chick, PA-C  metoCLOPramide (REGLAN) 10 MG tablet Take 1 tablet (10 mg total) by mouth every 6 (six) hours as needed for nausea or vomiting. 05/01/22   Blanchie Dessert, MD  potassium chloride SA (KLOR-CON M) 20 MEQ tablet Take 1 tablet (20 mEq total) by mouth daily. Patient not taking: Reported on 05/01/2022 03/05/22   Jeanie Sewer, NP  rosuvastatin (CRESTOR) 10 MG tablet Take 1 tablet (10 mg total) by mouth daily. 02/28/22 02/23/23  Loel Dubonnet, NP  traZODone (DESYREL) 100 MG tablet TAKE 1 TO 2 TABLETS(100 TO 200 MG) BY MOUTH AT BEDTIME 06/12/22   Jeanie Sewer, NP  Allergies    Sertraline, Codeine, Depakote [divalproex sodium], Migranal [dihydroergotamine], and Topamax [topiramate]    Review of Systems   Review of Systems  Physical Exam Updated Vital Signs BP (!) 142/106   Pulse 74   Temp 98 F (36.7 C) (Oral)   Resp 17   Ht 6' (1.829 m)   Wt 65.8 kg   SpO2 100%   BMI 19.67 kg/m  Physical Exam Vitals and nursing note reviewed.  Constitutional:      Appearance: He is well-developed.  HENT:     Head: Normocephalic and atraumatic. No raccoon eyes or Battle's sign.     Right Ear: Tympanic membrane,  ear canal and external ear normal. No hemotympanum.     Left Ear: Tympanic membrane, ear canal and external ear normal. No hemotympanum.     Nose: Nose normal.  Eyes:     General: Lids are normal.     Conjunctiva/sclera: Conjunctivae normal.     Pupils: Pupils are equal, round, and reactive to light.     Comments: No visible hyphema  Cardiovascular:     Rate and Rhythm: Normal rate and regular rhythm.  Pulmonary:     Effort: Pulmonary effort is normal.     Breath sounds: Normal breath sounds.  Abdominal:     Palpations: Abdomen is soft.     Tenderness: There is no abdominal tenderness.  Musculoskeletal:     Right shoulder: Bony tenderness present. Decreased range of motion.     Left shoulder: No bony tenderness. Normal range of motion.     Right upper arm: No tenderness.     Left upper arm: No tenderness.     Right elbow: Normal range of motion.     Left elbow: Normal range of motion.     Cervical back: Normal range of motion and neck supple. Tenderness (mild, paraspinous) present. No bony tenderness. Normal range of motion.     Thoracic back: Tenderness and bony tenderness present.     Lumbar back: No tenderness or bony tenderness.     Comments: Patient with exquisite tenderness to palpation over the right scapula, right posterior ribs, and right mid thoracic spine.  No step-offs.  No contusions, hematomas or ecchymosis noted.  Skin:    General: Skin is warm and dry.  Neurological:     Mental Status: He is alert and oriented to person, place, and time.     GCS: GCS eye subscore is 4. GCS verbal subscore is 5. GCS motor subscore is 6.     Cranial Nerves: No cranial nerve deficit.     Sensory: No sensory deficit.     Coordination: Coordination normal.     Gait: Gait normal.     Comments: Patient ambulatory without difficulty.     ED Results / Procedures / Treatments   Labs (all labs ordered are listed, but only abnormal results are displayed) Labs Reviewed  CBC WITH  DIFFERENTIAL/PLATELET  COMPREHENSIVE METABOLIC PANEL  URINALYSIS, ROUTINE W REFLEX MICROSCOPIC     EKG Interpretation  Date/Time:  Thursday June 26 2022 12:15:32 EDT Ventricular Rate:  100 PR Interval:  160 QRS Duration: 88 QT Interval:  328 QTC Calculation: 423 R Axis:   164 Text Interpretation: Normal sinus rhythm Right axis deviation Anteroseptal infarct , age undetermined Abnormal ECG When compared with ECG of 01-May-2022 11:45, Unchanged Confirmed by Garnette Gunner [phone]) on 06/26/2022 3:21:45 PM         Radiology CT Thoracic Spine Wo Contrast  Result Date:  06/26/2022 CLINICAL DATA:  Back trauma, no prior imaging (Age >= 16y) EXAM: CT THORACIC SPINE WITHOUT CONTRAST TECHNIQUE: Multidetector CT images of the thoracic were obtained using the standard protocol without intravenous contrast. RADIATION DOSE REDUCTION: This exam was performed according to the departmental dose-optimization program which includes automated exposure control, adjustment of the mA and/or kV according to patient size and/or use of iterative reconstruction technique. COMPARISON:  CT 02/28/2020. FINDINGS: Alignment: Normal. Vertebrae: Nondisplaced fracture through the left inferior articular process of T4 (series 9 images 37-38). Nondisplaced fractures through the base of the right T9 pedicle and transverse process (series 9, image 27, series 3, images 99 and 100). There is a nondisplaced fracture through the right pedicle and inferior articular process of T10 (series 9, image 28, series 3, image 114). Additional subtle nondisplaced fractures through the left superior and inferior articular processes/posterior aspect of the left pedicle (series 9, image 40). There are multiple posterior right rib fractures, including a nondisplaced fractures of the posterior third and fourth ribs, and minimally displaced fractures of the medial posterior fifth through ninth ribs near the costovertebral junction. No evidence of a  vertebral body compression fracture. There is unchanged mild depression of the T1 and T2 superior endplates in comparison to prior CT in April 2021. Unchanged mild left-sided superior endplate depression at Z61 as well. Paraspinal and other soft tissues: Mild centrilobular and paraseptal emphysema. Mild posterior peripheral interstitial lung abnormality on the right. Disc levels: Mild multilevel degenerative changes. No visible impingement. IMPRESSION: Nondisplaced fracture of the left inferior articular process of T4. Nondisplaced fractures through the base of the right T9 pedicle and transverse process. Nondisplaced fractures through the right-sided pedicle and inferior articular process of T10, and of the left-sided superior and inferior articular processes/posterior aspect of the left pedicle. Multiple posterior right rib fractures, including nondisplaced fractures of the posterior third and fourth ribs, and minimally displaced fractures of the medial posterior fifth through ninth ribs near the costovertebral junction. No evidence of vertebral body compression fracture. Unchanged mild depression of the T1, T2, and T11 superior endplates in comparison prior CT in April 2021. Electronically Signed   By: Maurine Simmering M.D.   On: 06/26/2022 13:43   CT Head Wo Contrast  Result Date: 06/26/2022 CLINICAL DATA:  Provided history: Head trauma, moderate/severe. Neck pain, acute, no red flags. EXAM: CT HEAD WITHOUT CONTRAST CT CERVICAL SPINE WITHOUT CONTRAST TECHNIQUE: Multidetector CT imaging of the head and cervical spine was performed following the standard protocol without intravenous contrast. Multiplanar CT image reconstructions of the cervical spine were also generated. RADIATION DOSE REDUCTION: This exam was performed according to the departmental dose-optimization program which includes automated exposure control, adjustment of the mA and/or kV according to patient size and/or use of iterative reconstruction  technique. COMPARISON:  Head CT 08/04/2019. FINDINGS: CT HEAD FINDINGS Brain: Mild generalized parenchymal atrophy. 7 mm rounded extra-axial dural-based focus of calcification along the high left frontoparietal lobes suspicious for a small meningioma. This is unchanged from the prior head CT of 08/04/2019. There is no acute intracranial hemorrhage. No demarcated cortical infarct. No extra-axial fluid collection. No midline shift. Vascular: No hyperdense vessel. Atherosclerotic calcifications. Skull: No fracture or aggressive osseous lesion. Sinuses/Orbits: No mass or acute finding within the imaged orbits. Minimal mucosal thickening within the left maxillary sinus. Mucosal thickening within the bilateral nasal passages and right frontoethmoidal recess. CT CERVICAL SPINE FINDINGS Alignment: Straightening of the expected cervical lordosis. No significant spondylolisthesis. Skull base and vertebrae: The basion-dental and  atlanto-dental intervals are maintained.No evidence of acute fracture to the cervical spine. Soft tissues and spinal canal: No prevertebral fluid or swelling. No visible canal hematoma. Disc levels: Cervical spondylosis with multilevel disc space narrowing, disc bulges/central disc protrusions, endplate spurring, uncovertebral hypertrophy and facet arthrosis. Disc space narrowing is greatest at C5-C6 (advanced) and C6-C7 (moderate to moderately advanced). No appreciable high-grade spinal canal stenosis. Multilevel bony neural foraminal narrowing. Upper chest: No consolidation within the imaged lung apices. Biapical pleuroparenchymal scarring and paraseptal emphysema. Other: Nondisplaced acute fracture of the posterior right third rib. IMPRESSION: CT head: 1. No acute posttraumatic intracranial findings. 2. Unchanged 7 mm rounded dural-based extra-axial focus of calcification along the high left frontoparietal lobes, suspicious for a small meningioma. 3. Mild generalized parenchymal atrophy. 4.  Sinonasal disease at the imaged levels, as described. CT cervical spine: 1. No evidence of acute fracture to the cervical spine. 2. Acute nondisplaced fracture of the posterior right third rib. 3. Cervical spondylosis, as described. 4.  Emphysema (ICD10-J43.9). Electronically Signed   By: Kellie Simmering D.O.   On: 06/26/2022 13:33   CT Cervical Spine Wo Contrast  Result Date: 06/26/2022 CLINICAL DATA:  Provided history: Head trauma, moderate/severe. Neck pain, acute, no red flags. EXAM: CT HEAD WITHOUT CONTRAST CT CERVICAL SPINE WITHOUT CONTRAST TECHNIQUE: Multidetector CT imaging of the head and cervical spine was performed following the standard protocol without intravenous contrast. Multiplanar CT image reconstructions of the cervical spine were also generated. RADIATION DOSE REDUCTION: This exam was performed according to the departmental dose-optimization program which includes automated exposure control, adjustment of the mA and/or kV according to patient size and/or use of iterative reconstruction technique. COMPARISON:  Head CT 08/04/2019. FINDINGS: CT HEAD FINDINGS Brain: Mild generalized parenchymal atrophy. 7 mm rounded extra-axial dural-based focus of calcification along the high left frontoparietal lobes suspicious for a small meningioma. This is unchanged from the prior head CT of 08/04/2019. There is no acute intracranial hemorrhage. No demarcated cortical infarct. No extra-axial fluid collection. No midline shift. Vascular: No hyperdense vessel. Atherosclerotic calcifications. Skull: No fracture or aggressive osseous lesion. Sinuses/Orbits: No mass or acute finding within the imaged orbits. Minimal mucosal thickening within the left maxillary sinus. Mucosal thickening within the bilateral nasal passages and right frontoethmoidal recess. CT CERVICAL SPINE FINDINGS Alignment: Straightening of the expected cervical lordosis. No significant spondylolisthesis. Skull base and vertebrae: The basion-dental  and atlanto-dental intervals are maintained.No evidence of acute fracture to the cervical spine. Soft tissues and spinal canal: No prevertebral fluid or swelling. No visible canal hematoma. Disc levels: Cervical spondylosis with multilevel disc space narrowing, disc bulges/central disc protrusions, endplate spurring, uncovertebral hypertrophy and facet arthrosis. Disc space narrowing is greatest at C5-C6 (advanced) and C6-C7 (moderate to moderately advanced). No appreciable high-grade spinal canal stenosis. Multilevel bony neural foraminal narrowing. Upper chest: No consolidation within the imaged lung apices. Biapical pleuroparenchymal scarring and paraseptal emphysema. Other: Nondisplaced acute fracture of the posterior right third rib. IMPRESSION: CT head: 1. No acute posttraumatic intracranial findings. 2. Unchanged 7 mm rounded dural-based extra-axial focus of calcification along the high left frontoparietal lobes, suspicious for a small meningioma. 3. Mild generalized parenchymal atrophy. 4. Sinonasal disease at the imaged levels, as described. CT cervical spine: 1. No evidence of acute fracture to the cervical spine. 2. Acute nondisplaced fracture of the posterior right third rib. 3. Cervical spondylosis, as described. 4.  Emphysema (ICD10-J43.9). Electronically Signed   By: Kellie Simmering D.O.   On: 06/26/2022 13:33   DG Shoulder  Right  Result Date: 06/26/2022 CLINICAL DATA:  Right shoulder pain. Fell 6 feet and loss consciousness five days ago. EXAM: RIGHT SHOULDER - 2+ VIEW COMPARISON:  Right shoulder x-rays dated Mar 27, 2022. FINDINGS: No acute fracture or dislocation. Prior reverse total shoulder arthroplasty. No evidence of hardware failure or loosening. Soft tissues are unremarkable. IMPRESSION: 1. No acute osseous abnormality. Electronically Signed   By: Titus Dubin M.D.   On: 06/26/2022 13:08    Procedures Procedures    Medications Ordered in ED Medications  HYDROmorphone (DILAUDID)  injection 0.5 mg (0.5 mg Intravenous Given 06/26/22 1434)  ondansetron (ZOFRAN) injection 4 mg (4 mg Intravenous Given 06/26/22 1441)    ED Course/ Medical Decision Making/ A&P    Patient seen and examined. History obtained directly from patient and family member at bedside who gives additional history regarding symptoms.  Work-up including labs, imaging, EKG ordered in triage, if performed, were reviewed.  No QT prolongation.  Labs/EKG: Added CBC, CMP, UA.  Imaging: Independently visualized and interpreted.  This included: X-ray of the right shoulder, CT head, CT cervical spine, CT's thoracic spine.  Agree multiple rib and thoracic fractures.  CT head and cervical spine, agree negative.  Added CT chest, abdomen, pelvis with contrast due to patient having multiple back and thoracic fractures, placing him at high risk for internal chest and abdominal injury which would not be seen on spinal CT performed thus far.   Medications/Fluids: IV Dilaudid, IV Zofran.  Most recent vital signs reviewed and are as follows: BP (!) 119/90   Pulse 75   Temp 98 F (36.7 C) (Oral)   Resp 20   Ht 6' (1.829 m)   Wt 65.8 kg   SpO2 90%   BMI 19.67 kg/m   Initial impression: Fall, rib and thoracic spine fractures.  Awaiting trauma imaging.  Awaiting lab workup.  Patient discussed with and seen by Dr. Truett Mainland.   3:22 PM signout to Haywood Pao at shift change.  Plan: Reassess to evaluate pain control, follow-up on CT imaging.  Recommend neurosurgery recommendations for multiple fractures after return of imaging.                             Medical Decision Making Amount and/or Complexity of Data Reviewed Labs: ordered. Radiology: ordered.  Risk Prescription drug management.  Patient with mechanical sounding fall, 9 days ago per family and report.  Multiple fractures noted on imaging, pending completion of workup.  No close head injury suspected.        Final Clinical Impression(s) / ED  Diagnoses Final diagnoses:  None    Rx / DC Orders ED Discharge Orders     None         Carlisle Cater, Hershal Coria 06/26/22 1524    Cristie Hem, MD 06/26/22 1547    Carlisle Cater, PA-C 07/15/22 2250    Cristie Hem, MD 07/16/22 [phone]

## 2022-06-26 NOTE — ED Triage Notes (Signed)
Patient reports that he fell off of his deck which is 6 ft up 5 days ago. Patient had LOC and states a family member  states that he was unconscious x 25 minutes.  Patient states he has right shoulder pain, back pain, nausea, and a headache.

## 2022-07-02 ENCOUNTER — Encounter (HOSPITAL_COMMUNITY): Payer: Self-pay

## 2022-07-02 ENCOUNTER — Emergency Department (HOSPITAL_COMMUNITY): Payer: Medicare HMO

## 2022-07-02 ENCOUNTER — Emergency Department (HOSPITAL_COMMUNITY)
Admission: EM | Admit: 2022-07-02 | Discharge: 2022-07-02 | Disposition: A | Discharge status: Home / Self Care | Payer: Medicare HMO | Attending: Emergency Medicine | Admitting: Emergency Medicine

## 2022-07-02 DIAGNOSIS — R079 Chest pain, unspecified: Secondary | ICD-10-CM | POA: Diagnosis not present

## 2022-07-02 DIAGNOSIS — I2510 Atherosclerotic heart disease of native coronary artery without angina pectoris: Secondary | ICD-10-CM | POA: Insufficient documentation

## 2022-07-02 DIAGNOSIS — S22049D Unspecified fracture of fourth thoracic vertebra, subsequent encounter for fracture with routine healing: Secondary | ICD-10-CM

## 2022-07-02 DIAGNOSIS — M549 Dorsalgia, unspecified: Secondary | ICD-10-CM | POA: Diagnosis not present

## 2022-07-02 DIAGNOSIS — S2241XD Multiple fractures of ribs, right side, subsequent encounter for fracture with routine healing: Secondary | ICD-10-CM | POA: Diagnosis not present

## 2022-07-02 DIAGNOSIS — R519 Headache, unspecified: Secondary | ICD-10-CM | POA: Insufficient documentation

## 2022-07-02 DIAGNOSIS — S22039D Unspecified fracture of third thoracic vertebra, subsequent encounter for fracture with routine healing: Secondary | ICD-10-CM

## 2022-07-02 DIAGNOSIS — S0990XA Unspecified injury of head, initial encounter: Secondary | ICD-10-CM | POA: Diagnosis not present

## 2022-07-02 DIAGNOSIS — S22050D Wedge compression fracture of T5-T6 vertebra, subsequent encounter for fracture with routine healing: Secondary | ICD-10-CM | POA: Diagnosis not present

## 2022-07-02 DIAGNOSIS — T1490XA Injury, unspecified, initial encounter: Secondary | ICD-10-CM | POA: Diagnosis not present

## 2022-07-02 DIAGNOSIS — S22048D Other fracture of fourth thoracic vertebra, subsequent encounter for fracture with routine healing: Secondary | ICD-10-CM | POA: Diagnosis not present

## 2022-07-02 DIAGNOSIS — S2243XD Multiple fractures of ribs, bilateral, subsequent encounter for fracture with routine healing: Secondary | ICD-10-CM

## 2022-07-02 DIAGNOSIS — R200 Anesthesia of skin: Secondary | ICD-10-CM | POA: Insufficient documentation

## 2022-07-02 DIAGNOSIS — S22030B Wedge compression fracture of third thoracic vertebra, initial encounter for open fracture: Secondary | ICD-10-CM | POA: Diagnosis not present

## 2022-07-02 DIAGNOSIS — S22059D Unspecified fracture of T5-T6 vertebra, subsequent encounter for fracture with routine healing: Secondary | ICD-10-CM

## 2022-07-02 IMAGING — CR DG SHOULDER 1V*R*
1 series · 1 of 1 positions shown · non-contrast
Comparison: 02/11/2022

CLINICAL DATA: Post reverse shoulder arthroplasty

EXAM:
RIGHT SHOULDER - 1 VIEW

[shoulder ap]
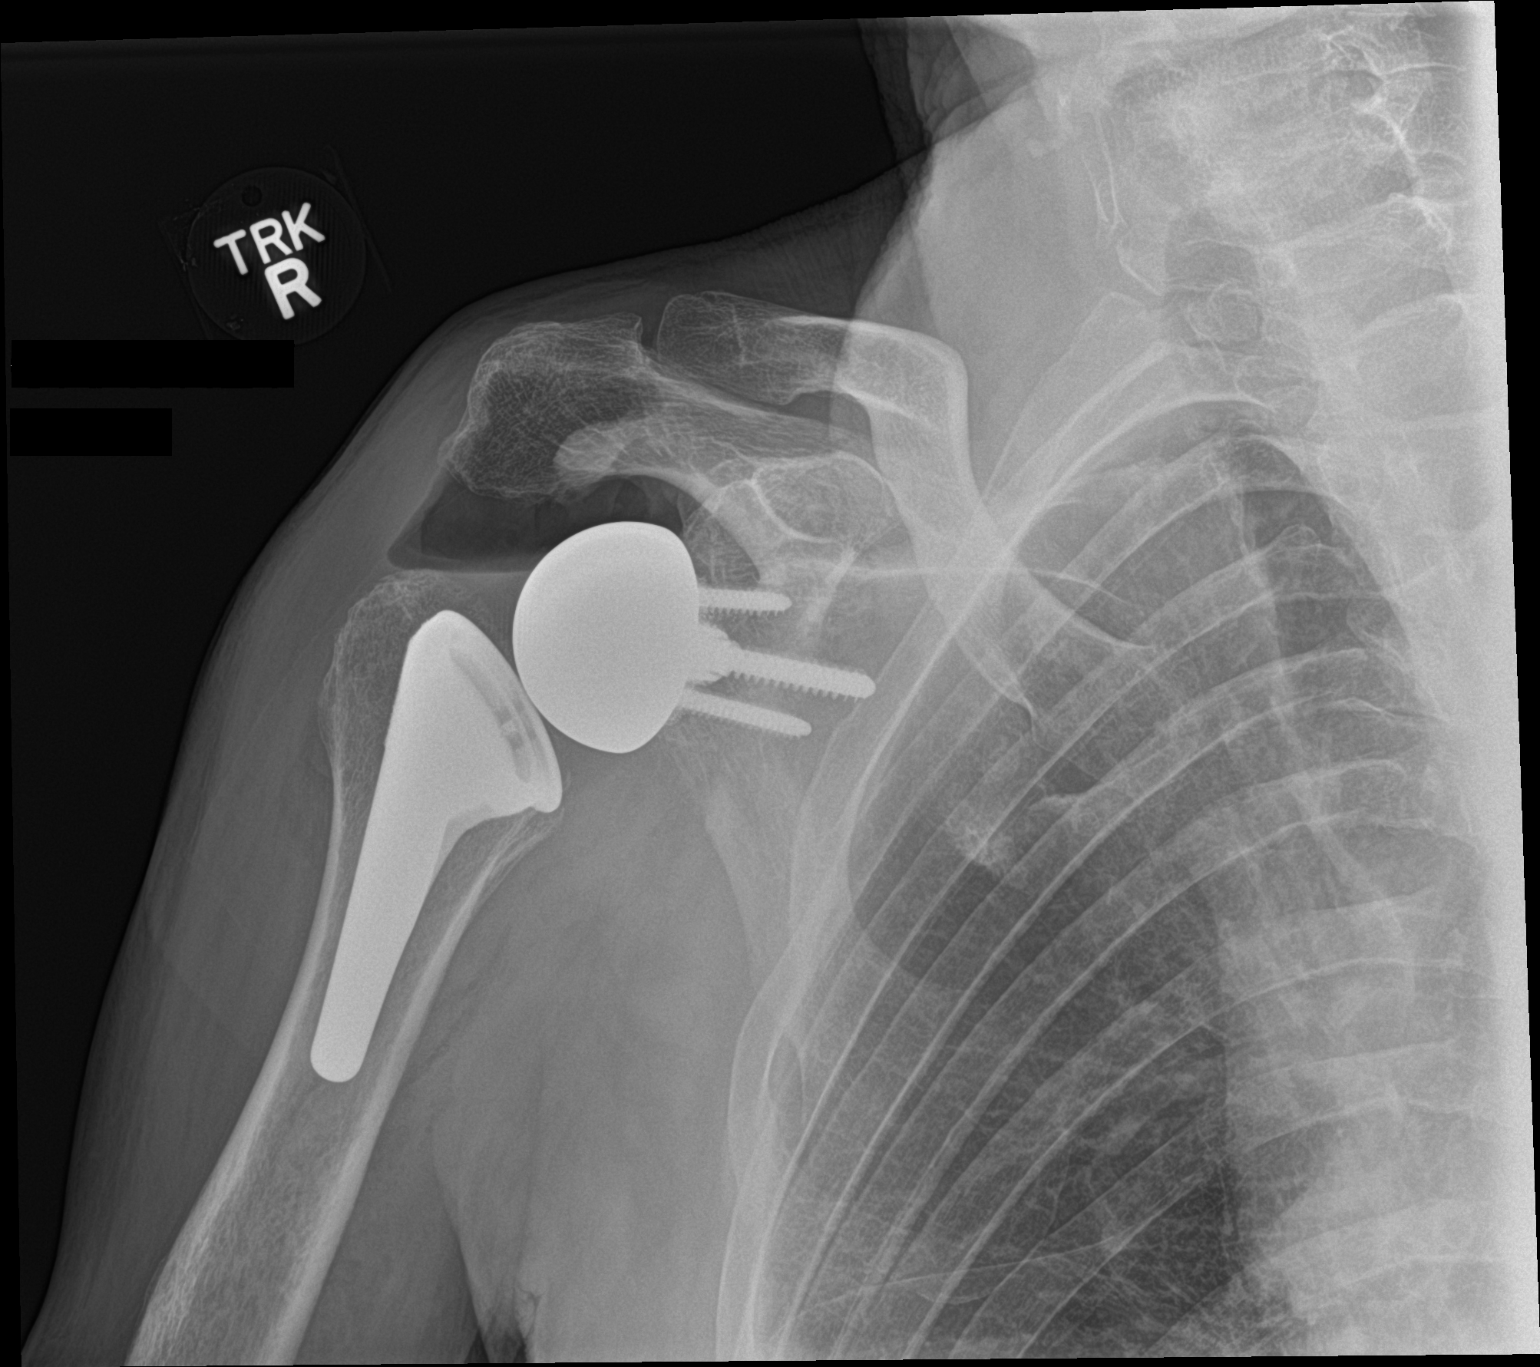

[1 of 1 positions shown; findings below may reference images not displayed]

FINDINGS: Single view of the shoulder, status post interval reverse shoulder
arthroplasty. No perihardware lucency or fracture. Components appear
in near anatomic alignment.
IMPRESSION: Status post interval reverse shoulder arthroplasty with no acute
osseous abnormality.

## 2022-07-02 MED ORDER — OXYCODONE-ACETAMINOPHEN 5-325 MG PO TABS
2.0000 | ORAL_TABLET | Freq: Once | ORAL | Status: AC
  Administered 2022-07-02: 2 via ORAL
  Filled 2022-07-02: qty 2

## 2022-07-02 MED ORDER — DIPHENHYDRAMINE HCL 50 MG/ML IJ SOLN
12.5000 mg | Freq: Once | INTRAMUSCULAR | Status: AC
  Administered 2022-07-02: 12.5 mg via INTRAVENOUS
  Filled 2022-07-02: qty 1

## 2022-07-02 MED ORDER — HYDROMORPHONE HCL 2 MG/ML IJ SOLN
1.0000 mg | Freq: Once | INTRAMUSCULAR | Status: AC
  Administered 2022-07-02: 1 mg via INTRAVENOUS
  Filled 2022-07-02: qty 1

## 2022-07-02 MED ORDER — CELECOXIB 200 MG PO CAPS: 200.0000 mg | ORAL_CAPSULE | Freq: Two times a day (BID) | ORAL | 0 refills | Status: DC

## 2022-07-02 MED ORDER — LORAZEPAM 2 MG/ML IJ SOLN
1.0000 mg | Freq: Once | INTRAMUSCULAR | Status: AC
  Administered 2022-07-02: 1 mg via INTRAVENOUS
  Filled 2022-07-02: qty 1

## 2022-07-02 MED ORDER — OXYCODONE HCL 5 MG PO TABS: 2.5000 mg | ORAL_TABLET | Freq: Four times a day (QID) | ORAL | 0 refills | Status: DC | PRN

## 2022-07-02 NOTE — ED Provider Notes (Signed)
  Physical Exam  BP 121/82   Pulse 63   Temp 97.7 F (36.5 C) (Oral)   Resp 13   Ht 6' (1.829 m)   Wt 65.8 kg   SpO2 97%   BMI 19.67 kg/m   Physical Exam  Procedures  Procedures  ED Course / MDM    Medical Decision Making Amount and/or Complexity of Data Reviewed Radiology: ordered.  Risk Prescription drug management.   ***

## 2022-07-02 NOTE — ED Triage Notes (Signed)
PT c/o worsening pain to his upper and middle back and states he is now experiencing some numbness to right arm and right leg that started last night. Pt currently in a TLSO brace. Denies loss of control of bowel or bladder.

## 2022-07-02 NOTE — ED Notes (Signed)
Pt. Seen by nurse drinking fluids after instructed not to. Provider aware and notified. I reinforced the patient of the risk of fluids and food and not to drink or eat from this point forward until provider approves.

## 2022-07-02 NOTE — Discharge Instructions (Signed)
Get help right away if: You have difficulty breathing or you are short of breath. You develop a cough that does not stop, or you cough up thick or bloody sputum. You have nausea, vomiting, or pain in your abdomen. Your pain gets worse and medicine does not help.

## 2022-07-02 NOTE — ED Provider Triage Note (Signed)
Emergency Medicine Provider Triage Evaluation Note  ELIGH RYBACKI , a 61 y.o. male  was evaluated in triage.  Pt complains of right arm and right leg numbness which began last night.  Recent finding of fractures to his thoracic spine.  Is supposed to follow-up with Dr. Kathyrn Sheriff on 23 August.  His oxycodone 325's are not helping with pain control. No bowel or bladder.  Review of Systems  Positive: Thoracic pain Negative: Urinary symptoms  Physical Exam  BP (!) 119/91   Pulse 76   Temp 97.8 F (36.6 C) (Oral)   Resp 18   Ht 6' (1.829 m)   Wt 65.8 kg   SpO2 100%   BMI 19.67 kg/m  Gen:   Awake, no distress   Resp:  Normal effort  MSK:   Moves extremities without difficulty  Other:  TLSO in place  Medical Decision Making  Medically screening exam initiated at 11:34 AM.  Appropriate orders placed.  Thadeus Gandolfi Polizzi was informed that the remainder of the evaluation will be completed by another provider, this initial triage assessment does not replace that evaluation, and the importance of remaining in the ED until their evaluation is complete.     Janeece Fitting, PA-C 07/02/22 1138

## 2022-07-02 NOTE — ED Provider Notes (Signed)
[facility] DEPT Provider Note   CSN: [id number] Arrival date & time: 07/02/22  1043     History  Chief Complaint  Patient presents with   Back Pain    Spencer Mendoza is a 61 y.o. male.  With a history of recent fall resulting in multilevel thoracic spinal injuries which include nondisplaced fracture of the left anterior articular process of T4, nondisplaced fracture through the base of the right T5 transverse process, nondisplaced fracture to the right central pedicle and IV articular process of T3 and left-sided superior inferior articular process and posterior aspect of the left pedicle, multiple posterior right rib fractures including left third and fourth ribs which is not displaced, and minimally displaced fracture of the fifth through ninth ribs near the costovertebral junction.  This occurred on 06/18/2019.  He was seen in this ED on 06/25/2022.  Per note, neurosurgery was consulted and suggested patient be put in a TLSO brace and follow-up with neurosurgery.  He has an appointment on 8/23.  He was given oxycodone and a muscle relaxer for pain at home.  He states the muscle relaxer does not affect his pain levels, and oxycodone only makes him itchy and nauseous.  He reports that yesterday he started to feel paresthesias in his right upper and right lower extremities.  He states this is happened 5-6 times since yesterday, last approximately 30 minutes at a time before resolving.  He also reports sternal pain, inability to breathe deeply, persistent headaches.  He reports that he can typically see clearly, but has not been able to read the TV over the past day.  Also reports a bout of bowel incontinence yesterday.  States that he was trying to get out of bed to go to the bathroom, but defecated without control.  No urinary symptoms or incontinence.  Reports symptoms of saddle paresthesia.  Current pain level 10 out of 10.  Reports cough, denies hemoptysis.   Back  Pain Associated symptoms: chest pain, headaches and numbness   Associated symptoms: no dysuria, no fever and no weakness        Home Medications Prior to Admission medications   Medication Sig Start Date End Date Taking? Authorizing Provider  acetaminophen (TYLENOL) 500 MG tablet Take 1,000 mg by mouth 2 (two) times daily as needed (pain).    [provider]  albuterol (VENTOLIN HFA) 108 (90 Base) MCG/ACT inhaler INHALE 2 PUFFS BY MOUTH INTO THE LUNGS EVERY 4 HOURS AS NEEDED FOR SHORTNESS OF BREATH OR WHEEZING Patient taking differently: Inhale 2 puffs into the lungs as needed for wheezing or shortness of breath. 03/28/22   Jeanie Sewer, NP  botulinum toxin Type A (BOTOX) 100 units SOLR injection Inject 155 units every three months. Patient not taking: Reported on 05/01/2022 03/05/22   Suzzanne Cloud, NP  budesonide-formoterol [facility]) 160-4.5 MCG/ACT inhaler Inhale 2 puffs into the lungs 2 (two) times daily. 03/25/22   Jeanie Sewer, NP  cyclobenzaprine (FLEXERIL) 10 MG tablet Take 1 tablet (10 mg total) by mouth 2 (two) times daily as needed for muscle spasms. 06/26/22   Domenic Moras, PA-C  diphenhydramine-acetaminophen (TYLENOL PM) 25-500 MG TABS tablet Take 2 tablets by mouth at bedtime as needed.    [provider]  ibuprofen (ADVIL) 600 MG tablet Take 1 tablet (600 mg total) by mouth every 6 (six) hours as needed. 06/26/22   Domenic Moras, PA-C  levETIRAcetam (KEPPRA) 1000 MG tablet Take 1 tablet (1,000 mg total) by mouth 2 (  two) times daily. 06/26/22   Domenic Moras, PA-C  meclizine (ANTIVERT) 25 MG tablet Take 1 tablet (25 mg total) by mouth every 8 (eight) hours as needed for nausea. Patient not taking: Reported on 05/01/2022 03/27/22   Ethelda Chick, PA-C  metoCLOPramide (REGLAN) 10 MG tablet Take 1 tablet (10 mg total) by mouth every 6 (six) hours as needed for nausea or vomiting. 05/01/22   Blanchie Dessert, MD  oxyCODONE-acetaminophen (PERCOCET) 5-325 MG  tablet Take 1 tablet by mouth every 6 (six) hours as needed for moderate pain or severe pain. 06/26/22   Domenic Moras, PA-C  potassium chloride SA (KLOR-CON M) 20 MEQ tablet Take 1 tablet (20 mEq total) by mouth daily. Patient not taking: Reported on 05/01/2022 03/05/22   Jeanie Sewer, NP  rosuvastatin (CRESTOR) 10 MG tablet Take 1 tablet (10 mg total) by mouth daily. 02/28/22 02/23/23  Loel Dubonnet, NP  traZODone (DESYREL) 100 MG tablet TAKE 1 TO 2 TABLETS(100 TO 200 MG) BY MOUTH AT BEDTIME 06/12/22   Jeanie Sewer, NP      Allergies    Sertraline, Codeine, Depakote [divalproex sodium], Migranal [dihydroergotamine], and Topamax [topiramate]    Review of Systems   Review of Systems  Constitutional:  Negative for chills and fever.  Respiratory:  Positive for cough and shortness of breath.   Cardiovascular:  Positive for chest pain.  Gastrointestinal:        Bowel incontinence  Genitourinary:  Negative for dysuria and urgency.  Musculoskeletal:  Positive for back pain.  Neurological:  Positive for numbness and headaches. Negative for dizziness, weakness and light-headedness.  All other systems reviewed and are negative.   Physical Exam Updated Vital Signs BP (!) 148/91   Pulse 64   Temp 97.9 F (36.6 C) (Oral)   Resp 10   Ht 6' (1.829 m)   Wt 65.8 kg   SpO2 100%   BMI 19.67 kg/m  Physical Exam Vitals and nursing note reviewed.  Constitutional:      General: He is not in acute distress.    Appearance: He is well-developed.  HENT:     Head: Normocephalic and atraumatic.  Eyes:     Extraocular Movements: Extraocular movements intact.     Conjunctiva/sclera: Conjunctivae normal.     Pupils: Pupils are equal, round, and reactive to light.     Comments: No nystagmus  Cardiovascular:     Rate and Rhythm: Normal rate and regular rhythm.     Pulses:          Radial pulses are 2+ on the right side and 2+ on the left side.       Dorsalis pedis pulses are 1+ on the right  side and 1+ on the left side.     Heart sounds: No murmur heard. Pulmonary:     Effort: Pulmonary effort is normal. Tachypnea present. No respiratory distress.     Breath sounds: Normal breath sounds. No decreased breath sounds.  Abdominal:     Palpations: Abdomen is soft.  Musculoskeletal:        General: No swelling.     Cervical back: Neck supple.     Right lower leg: No edema.     Left lower leg: No edema.  Skin:    General: Skin is warm and dry.     Capillary Refill: Capillary refill takes less than 2 seconds.  Neurological:     General: No focal deficit present.     Mental Status: He is alert  and oriented to person, place, and time.     GCS: GCS eye subscore is 4. GCS verbal subscore is 5. GCS motor subscore is 6.     Sensory: Sensation is intact.     Comments: Pain with upper and lower extremity movement  Psychiatric:        Mood and Affect: Mood normal.     ED Results / Procedures / Treatments   Labs (all labs ordered are listed, but only abnormal results are displayed) Labs Reviewed - No data to display  EKG None  Radiology No results found.  Procedures Procedures    Medications Ordered in ED Medications  HYDROmorphone (DILAUDID) injection 1 mg (has no administration in time range)  diphenhydrAMINE (BENADRYL) injection 12.5 mg (has no administration in time range)    ED Course/ Medical Decision Making/ A&P                           Medical Decision Making This patient presents to the ED for concern of multiple thoracic vertebral and rib fractures, bowel incontinence, pain, this involves an extensive number of treatment options, and is a complaint that carries with it a high risk of complications and morbidity. The emergent differential diagnosis for back pain includes but is not limited to fracture, muscle strain, cauda equina, spinal stenosis. DDD, ankylosing spondylitis, acute ligamentous injury, disk herniation, spondylolisthesis, Epidural compression  syndrome, metastatic cancer, transverse myelitis, vertebral osteomyelitis, diskitis, kidney stone, pyelonephritis, AAA, Perforated ulcer, Retrocecal appendicitis, pancreatitis, bowel obstruction, retroperitoneal hemorrhage or mass, meningitis.   Co morbidities that complicate the patient evaluation       Anxiety, substance abuse, epilepsy, CAD, claustrophobia, stroke  My initial workup includes MRI brain, C-spine, T-spine, L-spine to assess for intracranial abnormalities, nerve entrapment, cord compression  Additional history obtained from: Nursing notes from this visit. Prior ED visit on 06/26/2022 for initial encounter of fall with the injuries mentioned in HPI.  I ordered imaging studies including MRI head, C-spine, T-spine, L-spine at the time of signout these have not been resulted.   Cardiac Monitoring:       The patient was maintained on a cardiac monitor.  I personally viewed and interpreted the cardiac monitored which showed an underlying rhythm of: NSR  Consultations Obtained:  No consults at the time of signout.  Patient will be signed out to Margarita Mail, PA-C pending final results of MRI.  Patient is hemodynamically stable, ambulatory, and neurovascularly intact in all 4 extremities.  I suspect patient may be stable for discharge home with home health or physical therapy follow-up with normal MRI.  Abnormal MRI will require neurosurgery intervention.  Note: Portions of this report may have been transcribed using voice recognition software. Every effort was made to ensure accuracy; however, inadvertent computerized transcription errors may still be present.   Amount and/or Complexity of Data Reviewed Radiology: ordered.  Risk Prescription drug management.          Final Clinical Impression(s) / ED Diagnoses Final diagnoses:  None    Rx / DC Orders ED Discharge Orders     None         Nehemiah Massed 07/02/22 2228    Wyvonnia Dusky, MD 07/03/22 [phone]

## 2022-07-02 NOTE — ED Notes (Signed)
Pt is in MRI will get vitals when pt returns.

## 2022-07-09 DIAGNOSIS — S22009A Unspecified fracture of unspecified thoracic vertebra, initial encounter for closed fracture: Secondary | ICD-10-CM | POA: Diagnosis not present

## 2022-08-01 ENCOUNTER — Ambulatory Visit (INDEPENDENT_AMBULATORY_CARE_PROVIDER_SITE_OTHER): Payer: Medicare HMO

## 2022-08-01 DIAGNOSIS — Z0000 Encounter for general adult medical examination without abnormal findings: Secondary | ICD-10-CM

## 2022-08-01 NOTE — Patient Instructions (Signed)
[name] , Thank you for taking time to come for your Medicare Wellness Visit. I appreciate your ongoing commitment to your health goals. Please review the following plan we discussed and let me know if I can assist you in the future.   Screening recommendations/referrals: Colonoscopy: done 07/27/20 repeat every 3 years  Recommended yearly ophthalmology/optometry visit for glaucoma screening and checkup Recommended yearly dental visit for hygiene and checkup  Vaccinations: Influenza vaccine: due  Pneumococcal vaccine: due  Tdap vaccine: due and discussed  Shingles vaccine: Shingrix is 2 doses 2-6 months apart and over 90% effective    Covid-19: completed 09/28/20  Advanced directives: Please bring a copy of your health care power of attorney and living will to the office at your convenience.  Conditions/risks identified: get back to better health   Next appointment: Follow up in one year for your annual wellness visit   Preventive Care 40-64 Years, Male Preventive care refers to lifestyle choices and visits with your health care provider that can promote health and wellness. What does preventive care include? A yearly physical exam. This is also called an annual well check. Dental exams once or twice a year. Routine eye exams. Ask your health care provider how often you should have your eyes checked. Personal lifestyle choices, including: Daily care of your teeth and gums. Regular physical activity. Eating a healthy diet. Avoiding tobacco and drug use. Limiting alcohol use. Practicing safe sex. Taking low-dose aspirin every day starting at age 67. What happens during an annual well check? The services and screenings done by your health care provider during your annual well check will depend on your age, overall health, lifestyle risk factors, and family history of disease. Counseling  Your health care provider may ask you questions about your: Alcohol use. Tobacco  use. Drug use. Emotional well-being. Home and relationship well-being. Sexual activity. Eating habits. Work and work Statistician. Screening  You may have the following tests or measurements: Height, weight, and BMI. Blood pressure. Lipid and cholesterol levels. These may be checked every 5 years, or more frequently if you are over 41 years old. Skin check. Lung cancer screening. You may have this screening every year starting at age 88 if you have a 30-pack-year history of smoking and currently smoke or have quit within the past 15 years. Fecal occult blood test (FOBT) of the stool. You may have this test every year starting at age 51. Flexible sigmoidoscopy or colonoscopy. You may have a sigmoidoscopy every 5 years or a colonoscopy every 10 years starting at age 38. Prostate cancer screening. Recommendations will vary depending on your family history and other risks. Hepatitis C blood test. Hepatitis B blood test. Sexually transmitted disease (STD) testing. Diabetes screening. This is done by checking your blood sugar (glucose) after you have not eaten for a while (fasting). You may have this done every 1-3 years. Discuss your test results, treatment options, and if necessary, the need for more tests with your health care provider. Vaccines  Your health care provider may recommend certain vaccines, such as: Influenza vaccine. This is recommended every year. Tetanus, diphtheria, and acellular pertussis (Tdap, Td) vaccine. You may need a Td booster every 10 years. Zoster vaccine. You may need this after age 3. Pneumococcal 13-valent conjugate (PCV13) vaccine. You may need this if you have certain conditions and have not been vaccinated. Pneumococcal polysaccharide (PPSV23) vaccine. You may need one or two doses if you smoke cigarettes or if you have certain conditions. Talk to  your health care provider about which screenings and vaccines you need and how often you need them. This  information is not intended to replace advice given to you by your health care provider. Make sure you discuss any questions you have with your health care provider. Document Released: 11/30/2015 Document Revised: 07/23/2016 Document Reviewed: 09/04/2015 Elsevier Interactive Patient Education  2017 [place] Prevention in the Home Falls can cause injuries. They can happen to people of all ages. There are many things you can do to make your home safe and to help prevent falls. What can I do on the outside of my home? Regularly fix the edges of walkways and driveways and fix any cracks. Remove anything that might make you trip as you walk through a door, such as a raised step or threshold. Trim any bushes or trees on the path to your home. Use bright outdoor lighting. Clear any walking paths of anything that might make someone trip, such as rocks or tools. Regularly check to see if handrails are loose or broken. Make sure that both sides of any steps have handrails. Any raised decks and porches should have guardrails on the edges. Have any leaves, snow, or ice cleared regularly. Use sand or salt on walking paths during winter. Clean up any spills in your garage right away. This includes oil or grease spills. What can I do in the bathroom? Use night lights. Install grab bars by the toilet and in the tub and shower. Do not use towel bars as grab bars. Use non-skid mats or decals in the tub or shower. If you need to sit down in the shower, use a plastic, non-slip stool. Keep the floor dry. Clean up any water that spills on the floor as soon as it happens. Remove soap buildup in the tub or shower regularly. Attach bath mats securely with double-sided non-slip rug tape. Do not have throw rugs and other things on the floor that can make you trip. What can I do in the bedroom? Use night lights. Make sure that you have a light by your bed that is easy to reach. Do not use any sheets or  blankets that are too big for your bed. They should not hang down onto the floor. Have a firm chair that has side arms. You can use this for support while you get dressed. Do not have throw rugs and other things on the floor that can make you trip. What can I do in the kitchen? Clean up any spills right away. Avoid walking on wet floors. Keep items that you use a lot in easy-to-reach places. If you need to reach something above you, use a strong step stool that has a grab bar. Keep electrical cords out of the way. Do not use floor polish or wax that makes floors slippery. If you must use wax, use non-skid floor wax. Do not have throw rugs and other things on the floor that can make you trip. What can I do with my stairs? Do not leave any items on the stairs. Make sure that there are handrails on both sides of the stairs and use them. Fix handrails that are broken or loose. Make sure that handrails are as long as the stairways. Check any carpeting to make sure that it is firmly attached to the stairs. Fix any carpet that is loose or worn. Avoid having throw rugs at the top or bottom of the stairs. If you do have throw rugs, attach  them to the floor with carpet tape. Make sure that you have a light switch at the top of the stairs and the bottom of the stairs. If you do not have them, ask someone to add them for you. What else can I do to help prevent falls? Wear shoes that: Do not have high heels. Have rubber bottoms. Are comfortable and fit you well. Are closed at the toe. Do not wear sandals. If you use a stepladder: Make sure that it is fully opened. Do not climb a closed stepladder. Make sure that both sides of the stepladder are locked into place. Ask someone to hold it for you, if possible. Clearly mark and make sure that you can see: Any grab bars or handrails. First and last steps. Where the edge of each step is. Use tools that help you move around (mobility aids) if they are  needed. These include: Canes. Walkers. Scooters. Crutches. Turn on the lights when you go into a dark area. Replace any light bulbs as soon as they burn out. Set up your furniture so you have a clear path. Avoid moving your furniture around. If any of your floors are uneven, fix them. If there are any pets around you, be aware of where they are. Review your medicines with your doctor. Some medicines can make you feel dizzy. This can increase your chance of falling. Ask your doctor what other things that you can do to help prevent falls. This information is not intended to replace advice given to you by your health care provider. Make sure you discuss any questions you have with your health care provider. Document Released: 08/30/2009 Document Revised: 04/10/2016 Document Reviewed: 12/08/2014 Elsevier Interactive Patient Education  2017 [employer].

## 2022-08-01 NOTE — Progress Notes (Signed)
Virtual Visit via Telephone Note  I connected with  Spencer Mendoza on 08/01/22 at  3:15 PM EDT by telephone and verified that I am speaking with the correct person using two identifiers.  Medicare Annual Wellness visit completed telephonically due to Covid-19 pandemic.   Persons participating in this call: This Health Coach and this patient.   Location: Patient: home Provider: office    I discussed the limitations, risks, security and privacy concerns of performing an evaluation and management service by telephone and the availability of in person appointments. The patient expressed understanding and agreed to proceed.  Unable to perform video visit due to video visit attempted and failed and/or patient does not have video capability.   Some vital signs may be absent or patient reported.   Willette Brace, LPN   Subjective:   Spencer Mendoza is a 61 y.o. male who presents for an Initial Medicare Annual Wellness Visit.  Review of Systems     Cardiac Risk Factors include: advanced age (>1mn, >>47women);smoking/ tobacco exposure;hypertension;male gender     Objective:    There were no vitals filed for this visit. There is no height or weight on file to calculate BMI.     08/01/2022    3:21 PM 07/02/2022   11:29 AM 06/26/2022   12:44 PM 05/01/2022    9:11 AM 03/27/2022    6:55 AM 03/11/2022   12:58 PM 12/04/2021   10:21 AM  Advanced Directives  Does Patient Have a Medical Advance Directive? Yes No No No No No No  Type of [place]        Copy of [place]? No - copy requested        Would patient like information on creating a medical advance directive?   No - Patient declined No - Patient declined No - Patient declined  No - Patient declined    Current Medications (verified) Outpatient Encounter Medications as of 08/01/2022  Medication Sig   acetaminophen (TYLENOL) 500 MG tablet Take 1,000  mg by mouth 2 (two) times daily as needed (pain).   albuterol (VENTOLIN HFA) 108 (90 Base) MCG/ACT inhaler INHALE 2 PUFFS BY MOUTH INTO THE LUNGS EVERY 4 HOURS AS NEEDED FOR SHORTNESS OF BREATH OR WHEEZING (Patient taking differently: Inhale 2 puffs into the lungs as needed for wheezing or shortness of breath.)   budesonide-formoterol (SYMBICORT) 160-4.5 MCG/ACT inhaler Inhale 2 puffs into the lungs 2 (two) times daily.   celecoxib (CELEBREX) 200 MG capsule Take 1 capsule (200 mg total) by mouth 2 (two) times daily.   levETIRAcetam (KEPPRA) 1000 MG tablet Take 1 tablet (1,000 mg total) by mouth 2 (two) times daily.   rosuvastatin (CRESTOR) 10 MG tablet Take 1 tablet (10 mg total) by mouth daily.   traZODone (DESYREL) 100 MG tablet TAKE 1 TO 2 TABLETS(100 TO 200 MG) BY MOUTH AT BEDTIME   meclizine (ANTIVERT) 25 MG tablet Take 1 tablet (25 mg total) by mouth every 8 (eight) hours as needed for nausea. (Patient not taking: Reported on 08/01/2022)   [DISCONTINUED] botulinum toxin Type A (BOTOX) 100 units SOLR injection Inject 155 units every three months. (Patient not taking: Reported on 05/01/2022)   [DISCONTINUED] cyclobenzaprine (FLEXERIL) 10 MG tablet Take 1 tablet (10 mg total) by mouth 2 (two) times daily as needed for muscle spasms.   [DISCONTINUED] diphenhydramine-acetaminophen (TYLENOL PM) 25-500 MG TABS tablet Take 2 tablets by mouth at bedtime as  needed.   [DISCONTINUED] ibuprofen (ADVIL) 600 MG tablet Take 1 tablet (600 mg total) by mouth every 6 (six) hours as needed.   [DISCONTINUED] metoCLOPramide (REGLAN) 10 MG tablet Take 1 tablet (10 mg total) by mouth every 6 (six) hours as needed for nausea or vomiting.   [DISCONTINUED] oxyCODONE (ROXICODONE) 5 MG immediate release tablet Take 0.5-1 tablets (2.5-5 mg total) by mouth every 6 (six) hours as needed for severe pain.   [DISCONTINUED] oxyCODONE-acetaminophen (PERCOCET) 5-325 MG tablet Take 1 tablet by mouth every 6 (six) hours as needed for  moderate pain or severe pain.   [DISCONTINUED] potassium chloride SA (KLOR-CON M) 20 MEQ tablet Take 1 tablet (20 mEq total) by mouth daily. (Patient not taking: Reported on 05/01/2022)   No facility-administered encounter medications on file as of 08/01/2022.    Allergies (verified) Sertraline, Codeine, Depakote [divalproex sodium], Migranal [dihydroergotamine], and Topamax [topiramate]   History: Past Medical History:  Diagnosis Date   Abdominal pain 08/30/2019   Alcohol withdrawal ([place]) 07/29/2013   Allergy    Anxiety    Arthritis    Benzodiazepine abuse ([place]) 08/30/2019   Bipolar disorder ([facility])    CAD (coronary artery disease)    Chronic insomnia 06/06/2015   Chronic migraine without aura, with intractable migraine, so stated, with status migrainosus 08/25/2019   Claustrophobia    Cocaine abuse ([place]) 05/22/2015   COPD (chronic obstructive pulmonary disease) ([place])    Delirium tremens ([place]) 07/29/2013   Epilepsy ([place])    last seizure week of 06-25-2020- sev. grand mal seizures per pt    Hemiplegic migraine with status migrainosus 06/06/2015   Hemiplegic migraine with status migrainosus 06/06/2015   Migraine    Opioid use disorder 08/30/2019   PONV (postoperative nausea and vomiting)    Stroke [facility])    patient denies- states had a hemiplegic migraine in ~2011   Substance abuse ([place])    Past Surgical History:  Procedure Laterality Date   ANKLE SURGERY     in high school   APPENDECTOMY     COLONOSCOPY     KNEE ARTHROSCOPY Right    NOSE SURGERY     REVERSE SHOULDER ARTHROPLASTY Right 03/27/2022   Procedure: REVERSE SHOULDER ARTHROPLASTY;  Surgeon: Hiram Gash, MD;  Location: [place];  Service: Orthopedics;  Laterality: Right;   SHOULDER ARTHROSCOPY WITH DISTAL CLAVICLE RESECTION Right 12/04/2021   Procedure: SHOULDER ARTHROSCOPY WITH DISTAL CLAVICLE EXCISION;  Surgeon: Hiram Gash, MD;  Location: [place];  Service: Orthopedics;   Laterality: Right;   SHOULDER ARTHROSCOPY WITH SUBACROMIAL DECOMPRESSION, ROTATOR CUFF REPAIR AND BICEP TENDON REPAIR Right 12/04/2021   Procedure: SHOULDER ARTHROSCOPY WITH SUBACROMIAL DECOMPRESSION, ROTATOR CUFF REPAIR AND BICEP TENDON REPAIR;  Surgeon: Hiram Gash, MD;  Location: [place];  Service: Orthopedics;  Laterality: Right;   Family History  Problem Relation Age of Onset   Heart disease Mother    Stroke Mother    Epilepsy Mother    Heart failure Mother    Cancer Father    Heart disease Father    Hypertension Father    Melanoma Father    Migraines Father    Colon cancer Sister 57   Colon cancer Cousin    Cancer Cousin    Colon polyps Neg Hx    Esophageal cancer Neg Hx    Rectal cancer Neg Hx    Stomach cancer Neg Hx    Social History   Socioeconomic History   Marital status:  Legally Separated    Spouse name: Not on file   Number of children: 0   Years of education: GED   Highest education level: Not on file  Occupational History   Occupation: disabled  Tobacco Use   Smoking status: Every Day    Packs/day: 0.25    Years: 15.00    Total pack years: 3.75    Types: Cigarettes   Smokeless tobacco: Never   Tobacco comments:    States he quit 02-25-22  Vaping Use   Vaping Use: Never used  Substance and Sexual Activity   Alcohol use: Not Currently    Alcohol/week: 6.0 standard drinks of alcohol    Types: 6 Cans of beer per week    Comment: none since Sept 2020   Drug use: Yes    Types: Marijuana   Sexual activity: Yes    Birth control/protection: Condom  Other Topics Concern   Not on file  Social History Narrative   Pt lives with sister.   Patient drinks 2 cups of caffeine daily.   Patient is right handed.   Social Determinants of Health   Financial Resource Strain: Low Risk  (08/01/2022)   Overall Financial Resource Strain (CARDIA)    Difficulty of Paying Living Expenses: Not hard at all  Food Insecurity: No Food Insecurity  (08/01/2022)   Hunger Vital Sign    Worried About Running Out of Food in the Last Year: Never true    Ran Out of Food in the Last Year: Never true  Transportation Needs: No Transportation Needs (08/01/2022)   PRAPARE - Hydrologist (Medical): No    Lack of Transportation (Non-Medical): No  Physical Activity: Inactive (08/01/2022)   Exercise Vital Sign    Days of Exercise per Week: 0 days    Minutes of Exercise per Session: 0 min  Stress: Stress Concern Present (08/01/2022)   [place]    Feeling of Stress : Rather much  Social Connections: Unknown (08/01/2022)   Social Connection and Isolation Panel [NHANES]    Frequency of Communication with Friends and Family: More than three times a week    Frequency of Social Gatherings with Friends and Family: More than three times a week    Attends Religious Services: More than 4 times per year    Active Member of [employer] or Organizations: Not on file    Attends Archivist Meetings: Not on file    Marital Status: Separated    Tobacco Counseling Ready to quit: Not Answered Counseling given: Not Answered Tobacco comments: States he quit 02-25-22   Clinical Intake:  Pre-visit preparation completed: Yes  Pain : No/denies pain     BMI - recorded: 19.23 Nutritional Status: BMI of 19-24  Normal Nutritional Risks: None Diabetes: No  How often do you need to have someone help you when you read instructions, pamphlets, or other written materials from your doctor or pharmacy?: 1 - Never  Diabetic?no  Interpreter Needed?: No  Information entered by :: Charlott Rakes, LPN   Activities of Daily Living    08/01/2022    3:23 PM 03/27/2022    7:02 AM  In your present state of health, do you have any difficulty performing the following activities:  Hearing? 1 0  Comment HOH   Vision? 0 0  Difficulty concentrating or making decisions? 0 0   Walking or climbing stairs? 1 0  Comment avoid at this time have  a ramp   Dressing or bathing? 0 1  Comment back just pains at times   Doing errands, shopping? 0   Preparing Food and eating ? Y   Comment sister cook   Using the Toilet? N   In the past six months, have you accidently leaked urine? N   Do you have problems with loss of bowel control? N   Managing your Medications? N   Managing your Finances? N   Housekeeping or managing your Housekeeping? N     Patient Care Team: Jeanie Sewer, NP as PCP - General (Family Medicine) Jerline Pain, MD as PCP - Cardiology (Cardiology)  Indicate any recent Medical Services you may have received from other than [facility] providers in the past year (date may be approximate).     Assessment:   This is a routine wellness examination for Spencer Mendoza.  Hearing/Vision screen Hearing Screening - Comments:: Pt stated HOH  Vision Screening - Comments:: Pt will ave to follow up with provider   Dietary issues and exercise activities discussed: Current Exercise Habits: The patient does not participate in regular exercise at present, Exercise limited by: Other - see comments (related to injury)   Goals Addressed             This Visit's Progress    Patient Stated       Get physical strength built back up        Depression Screen    08/01/2022    3:18 PM 02/26/2022   10:44 AM 03/19/2021    1:45 PM 09/25/2020    9:37 AM 03/20/2020    8:37 AM 11/30/2019   10:20 AM 10/03/2019   11:01 AM  PHQ 2/9 Scores  PHQ - 2 Score 0 0 0 1 0 2 0  PHQ- 9 Score  0 0 4 0 4 0    Fall Risk    08/01/2022    3:22 PM 02/03/2022    9:01 AM 03/19/2021    1:45 PM 09/25/2020    9:37 AM 07/12/2020    8:18 AM  Fall Risk   Falls in the past year? 1 0 0 0 0  Number falls in past yr: 1 0 0 0   Injury with Fall? 1 0 0 0   Comment fell off broken rail      Risk for fall due to : Impaired balance/gait;Impaired mobility;Impaired vision No Fall Risks   No Fall Risks  Risk  for fall due to: Comment realted to fall      Follow up Falls prevention discussed        [place]:  Any stairs in or around the home? No  If so, are there any without handrails? No  Home free of loose throw rugs in walkways, pet beds, electrical cords, etc? Yes  Adequate lighting in your home to reduce risk of falls? Yes   ASSISTIVE DEVICES UTILIZED TO PREVENT FALLS:  Life alert? No  Use of a cane, walker or w/c? No  Grab bars in the bathroom? Yes  Shower chair or bench in shower? Yes  Elevated toilet seat or a handicapped toilet? No   TIMED UP AND GO:  Was the test performed? No .   Cognitive Function:        08/01/2022    3:27 PM  6CIT Screen  What Year? 0 points  What month? 0 points  What time? 0 points  Count back from 20 0 points  Months in reverse 0 points  Repeat phrase 0 points  Total Score 0 points    Immunizations Immunization History  Administered Date(s) Administered   Influenza,inj,Quad PF,6+ Mos 07/30/2013   Moderna Sars-Covid-2 Vaccination 09/28/2020   Pneumococcal Polysaccharide-23 04/22/2019    TDAP status: Due, Education has been provided regarding the importance of this vaccine. Advised may receive this vaccine at local pharmacy or Health Dept. Aware to provide a copy of the vaccination record if obtained from local pharmacy or Health Dept. Verbalized acceptance and understanding.  Flu Vaccine status: Due, Education has been provided regarding the importance of this vaccine. Advised may receive this vaccine at local pharmacy or Health Dept. Aware to provide a copy of the vaccination record if obtained from local pharmacy or Health Dept. Verbalized acceptance and understanding.  Pneumococcal vaccine status: Due, Education has been provided regarding the importance of this vaccine. Advised may receive this vaccine at local pharmacy or Health Dept. Aware to provide a copy of the vaccination record if obtained from  local pharmacy or Health Dept. Verbalized acceptance and understanding.  Covid-19 vaccine status: Completed vaccines  Qualifies for Shingles Vaccine? Yes   Zostavax completed No   Shingrix Completed?: No.    Education has been provided regarding the importance of this vaccine. Patient has been advised to call insurance company to determine out of pocket expense if they have not yet received this vaccine. Advised may also receive vaccine at local pharmacy or Health Dept. Verbalized acceptance and understanding.  Screening Tests Health Maintenance  Topic Date Due   TETANUS/TDAP  Never done   Zoster Vaccines- Shingrix (1 of 2) Never done   INFLUENZA VACCINE  06/17/2022   COLONOSCOPY (Pts 45-14yr Insurance coverage will need to be confirmed)  07/28/2023   Hepatitis C Screening  Completed   HIV Screening  Completed   HPV VACCINES  Aged Out   COVID-19 Vaccine  Discontinued    Health Maintenance  Health Maintenance Due  Topic Date Due   TETANUS/TDAP  Never done   Zoster Vaccines- Shingrix (1 of 2) Never done   INFLUENZA VACCINE  06/17/2022    Colorectal cancer screening: Type of screening: Colonoscopy. Completed 07/27/20. Repeat every 3 years   Additional Screening:  Hepatitis C Screening:  Completed 08/30/19  Vision Screening: Recommended annual ophthalmology exams for early detection of glaucoma and other disorders of the eye. Is the patient up to date with their annual eye exam?  No  Who is the provider or what is the name of the office in which the patient attends annual eye exams? Needs to follow up with provider  If pt is not established with a provider, would they like to be referred to a provider to establish care? No .   Dental Screening: Recommended annual dental exams for proper oral hygiene  Community Resource Referral / Chronic Care Management: CRR required this visit?  No   CCM required this visit?  No      Plan:     I have personally reviewed and noted  the following in the patient's chart:   Medical and social history Use of alcohol, tobacco or illicit drugs  Current medications and supplements including opioid prescriptions. Patient is not currently taking opioid prescriptions. Functional ability and status Nutritional status Physical activity Advanced directives List of other physicians Hospitalizations, surgeries, and ER visits in previous 12 months Vitals Screenings to include cognitive, depression, and falls Referrals and appointments  In addition, I have reviewed and discussed with patient  certain preventive protocols, quality metrics, and best practice recommendations. A written personalized care plan for preventive services as well as general preventive health recommendations were provided to patient.     Willette Brace, LPN   0/16/5800   Nurse Notes: none

## 2022-08-06 DIAGNOSIS — S22009A Unspecified fracture of unspecified thoracic vertebra, initial encounter for closed fracture: Secondary | ICD-10-CM | POA: Diagnosis not present

## 2022-08-06 IMAGING — CT CT ABD-PELV W/ CM
2 of 5 series · 16 of 46 positions shown, 18 images · IV contrast (APPLIED)
Comparison: CT 05/21/2021

CLINICAL DATA: Abdominal pain, acute, nonlocalized

EXAM:
CT ABDOMEN AND PELVIS WITH CONTRAST
TECHNIQUE: Multidetector CT imaging of the abdomen and pelvis was performed
using the standard protocol following bolus administration of
intravenous contrast.

[Series 2: axial st · axial · 0.90mm/px · z∈[+1058,+1442]mm · 13 of 89 slices shown, 15 images]
[im 6/89  soft-tissue]
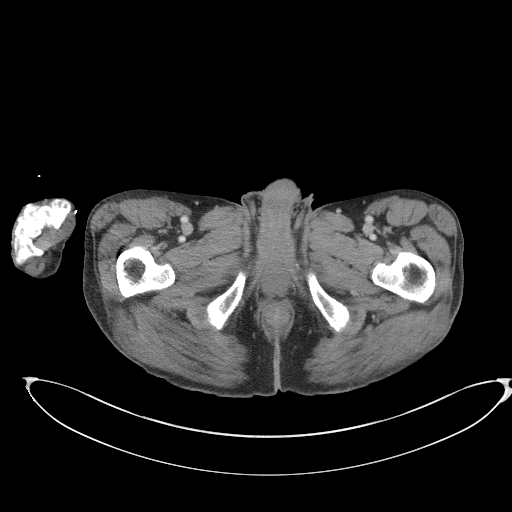
[im 6/89  bone]
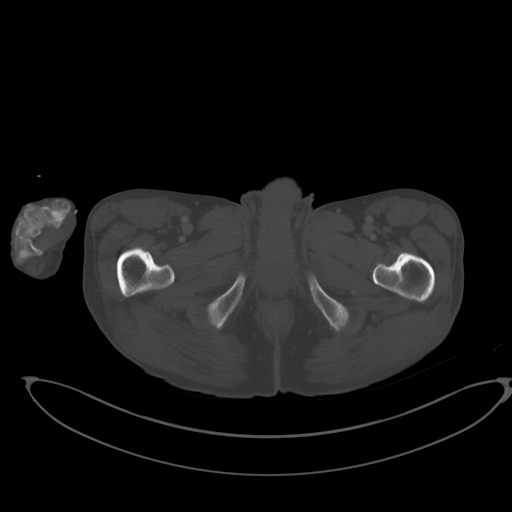
[im 12/89  soft-tissue]
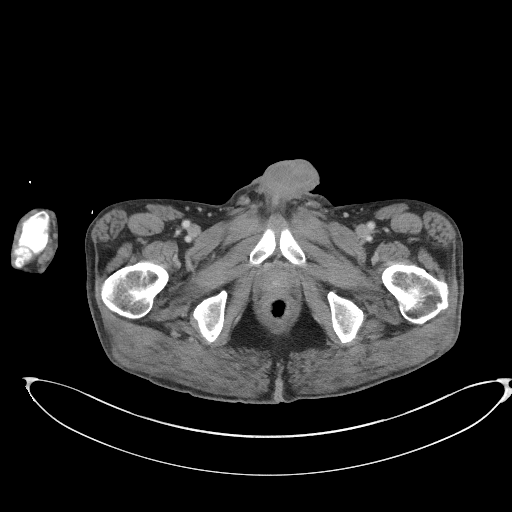
[im 18/89  soft-tissue]
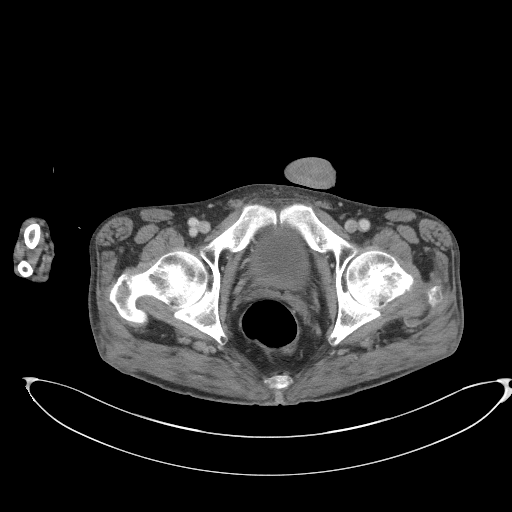
[im 24/89  soft-tissue]
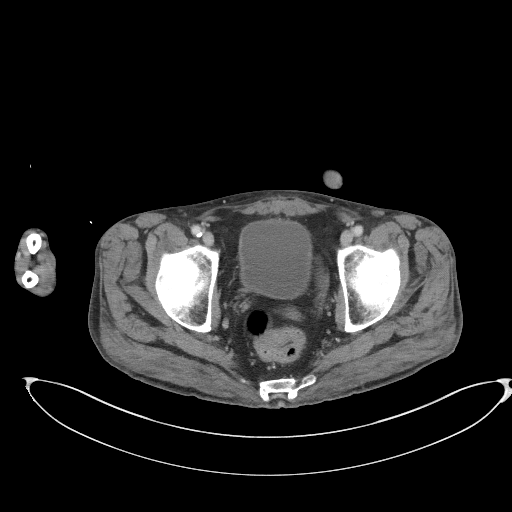
[im 30/89  soft-tissue]
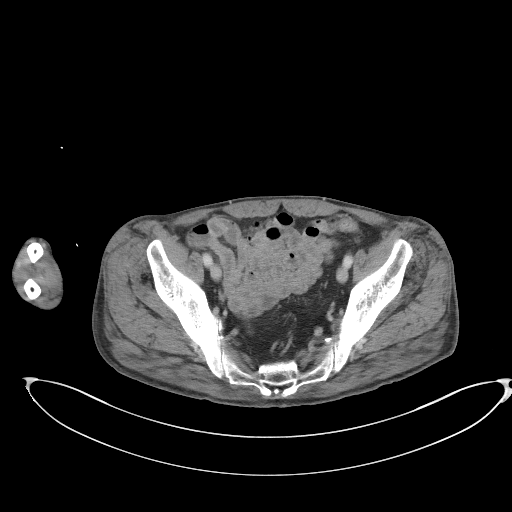
[im 36/89  soft-tissue]
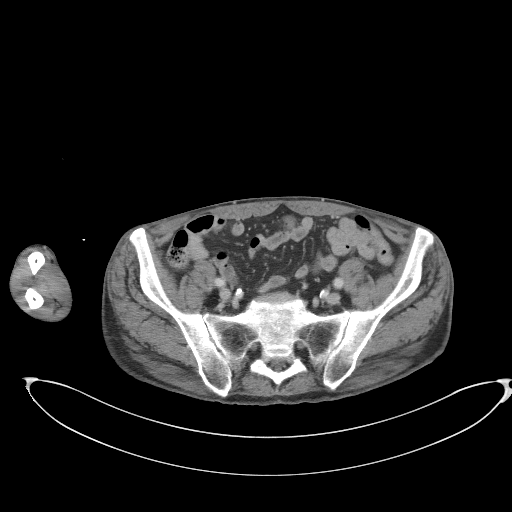
[im 47/89  soft-tissue]
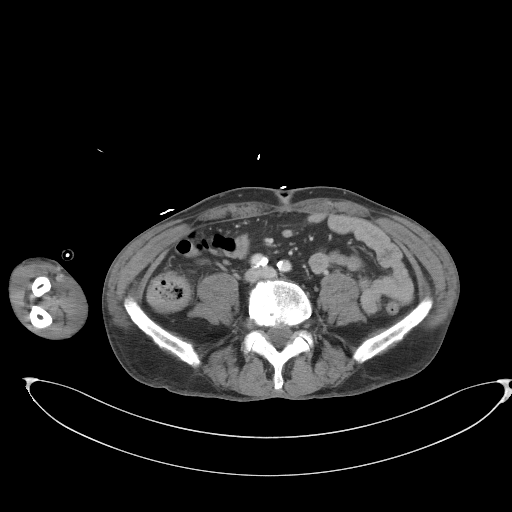
[im 53/89  soft-tissue]
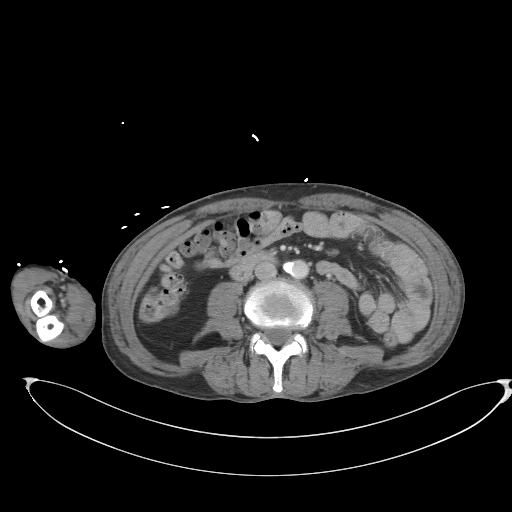
[im 59/89  soft-tissue]
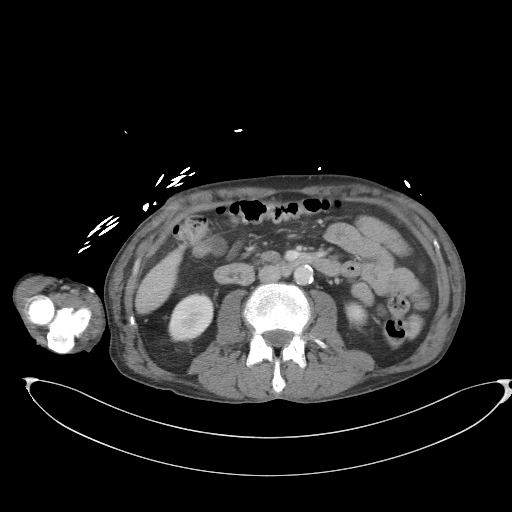
[im 59/89  bone]
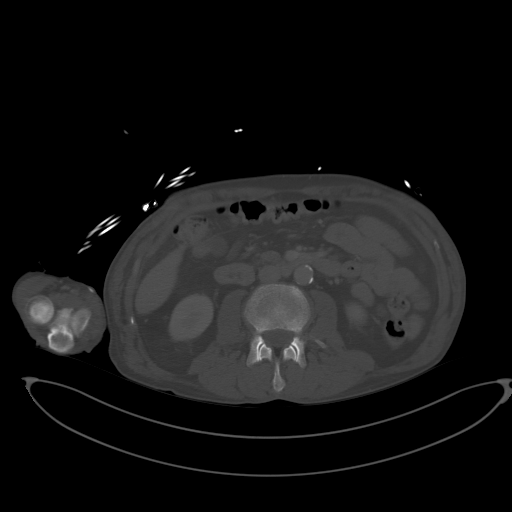
[im 65/89  soft-tissue]
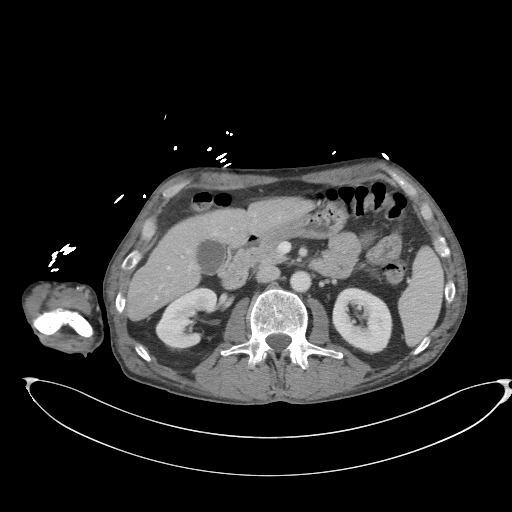
[im 71/89  soft-tissue]
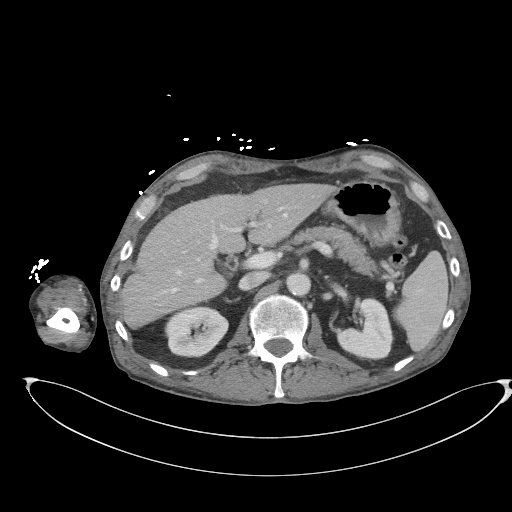
[im 77/89  soft-tissue]
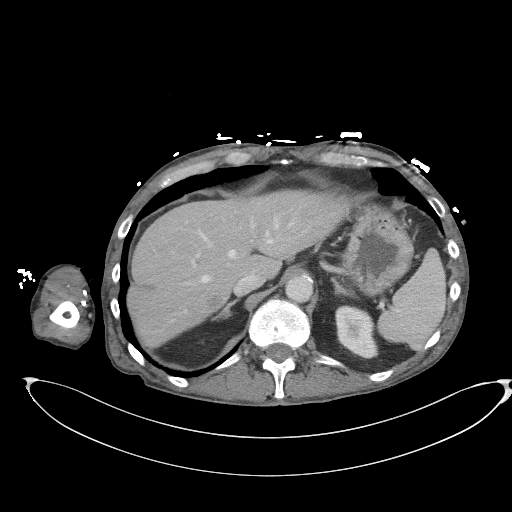
[im 83/89  soft-tissue]
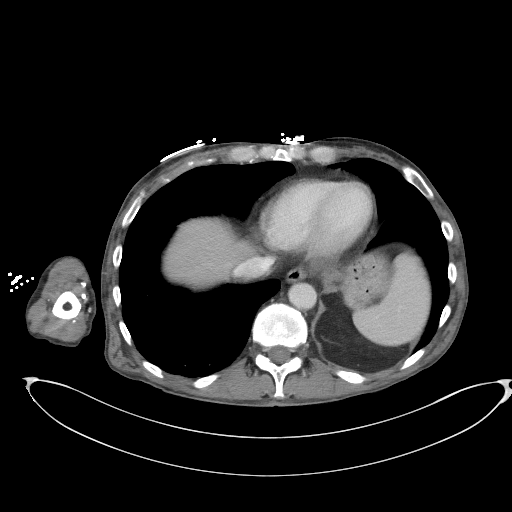

[Series 5: coronal st · coronal · 0.79mm/px · 3 of 101 slices shown]
[im 34/101  soft-tissue]
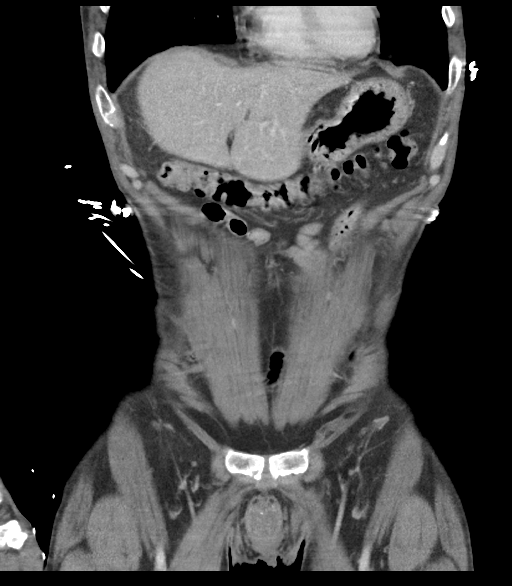
[im 45/101  soft-tissue]
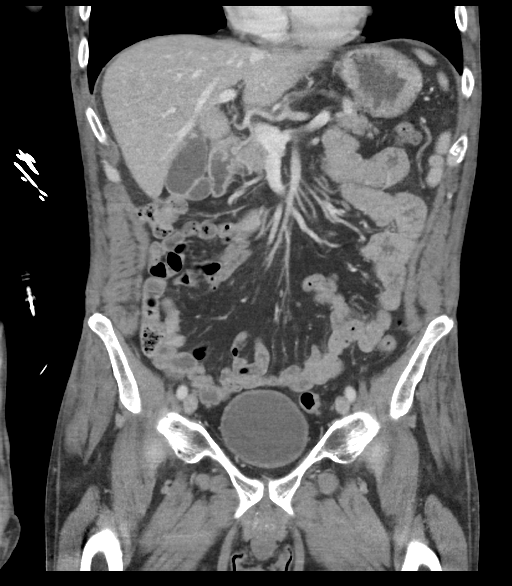
[im 56/101  soft-tissue]
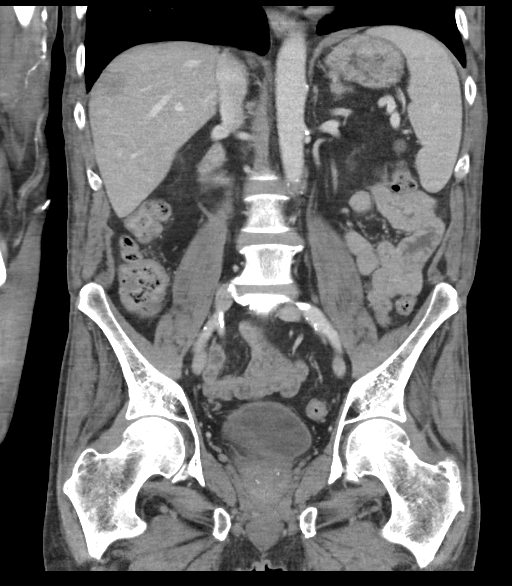

[16 of 46 positions shown; findings below may reference images not displayed]

RADIATION DOSE REDUCTION: This exam was performed according to the
departmental dose-optimization program which includes automated
exposure control, adjustment of the mA and/or kV according to
patient size and/or use of iterative reconstruction technique.

CONTRAST:  100mL OMNIPAQUE IOHEXOL 300 MG/ML  SOLN
FINDINGS: Lower chest: There are subacute-chronic anterior fifth and sixth rib
fractures. Lung bases are clear.

Hepatobiliary: There is an unchanged 1.2 cm hypodensity in the
posterior right hepatic lobe, similar to prior exam and likely a
small cyst or hemangioma (series 2, image 13). The gallbladder is
unremarkable.

Pancreas: Unremarkable. No pancreatic ductal dilatation or
surrounding inflammatory changes.

Spleen: Normal in size without focal abnormality.

Adrenals/Urinary Tract: Adrenal glands are unremarkable. No
hydronephrosis or nephrolithiasis. Bladder is unremarkable.

Stomach/Bowel: The stomach is within normal limits. There is no
evidence of bowel obstruction.Prior appendectomy. Scattered chronic
diverticula.

Vascular/Lymphatic: Aortoiliac atherosclerosis. No AAA. No
lymphadenopathy. Unchanged moderate left renal artery stenosis and
IMA ostial stenosis.

Reproductive: Unremarkable.

Other: No abdominal wall hernia or abnormality. No abdominopelvic
ascites.

Musculoskeletal: Subacute-chronic anterior fifth and sixth rib
fractures. No suspicious osseous lesion. Degenerative disc disease
at L4-L5. Mild bilateral hip osteoarthritis.
IMPRESSION: No acute findings in the abdomen or pelvis.

Subacute-chronic anterior fifth and sixth rib fractures, new since
May 2021.

## 2022-08-11 ENCOUNTER — Encounter: Payer: Self-pay | Admitting: *Deleted

## 2022-08-14 ENCOUNTER — Encounter: Payer: Medicare HMO | Admitting: Family

## 2022-08-21 ENCOUNTER — Encounter: Payer: Medicare HMO | Admitting: Family

## 2022-09-07 ENCOUNTER — Emergency Department (HOSPITAL_COMMUNITY): Payer: Medicare HMO

## 2022-09-07 ENCOUNTER — Other Ambulatory Visit: Payer: Self-pay

## 2022-09-07 ENCOUNTER — Encounter (HOSPITAL_COMMUNITY): Payer: Self-pay

## 2022-09-07 ENCOUNTER — Inpatient Hospital Stay (HOSPITAL_COMMUNITY)
Admission: EM | Admit: 2022-09-07 | Discharge: 2022-09-09 | DRG: 312 | Disposition: A | Discharge status: Home Health | Payer: Medicare HMO | Attending: Internal Medicine | Admitting: Internal Medicine

## 2022-09-07 DIAGNOSIS — Z96611 Presence of right artificial shoulder joint: Secondary | ICD-10-CM | POA: Diagnosis present

## 2022-09-07 DIAGNOSIS — Z635 Disruption of family by separation and divorce: Secondary | ICD-10-CM

## 2022-09-07 DIAGNOSIS — R569 Unspecified convulsions: Secondary | ICD-10-CM

## 2022-09-07 DIAGNOSIS — Z9049 Acquired absence of other specified parts of digestive tract: Secondary | ICD-10-CM

## 2022-09-07 DIAGNOSIS — R001 Bradycardia, unspecified: Secondary | ICD-10-CM | POA: Diagnosis present

## 2022-09-07 DIAGNOSIS — E876 Hypokalemia: Secondary | ICD-10-CM | POA: Diagnosis not present

## 2022-09-07 DIAGNOSIS — Z471 Aftercare following joint replacement surgery: Secondary | ICD-10-CM | POA: Diagnosis not present

## 2022-09-07 DIAGNOSIS — Z820 Family history of epilepsy and other diseases of the nervous system: Secondary | ICD-10-CM | POA: Diagnosis not present

## 2022-09-07 DIAGNOSIS — Z823 Family history of stroke: Secondary | ICD-10-CM

## 2022-09-07 DIAGNOSIS — R29810 Facial weakness: Secondary | ICD-10-CM | POA: Diagnosis present

## 2022-09-07 DIAGNOSIS — Y92009 Unspecified place in unspecified non-institutional (private) residence as the place of occurrence of the external cause: Secondary | ICD-10-CM | POA: Diagnosis not present

## 2022-09-07 DIAGNOSIS — S2249XA Multiple fractures of ribs, unspecified side, initial encounter for closed fracture: Secondary | ICD-10-CM | POA: Diagnosis present

## 2022-09-07 DIAGNOSIS — W19XXXA Unspecified fall, initial encounter: Secondary | ICD-10-CM | POA: Diagnosis present

## 2022-09-07 DIAGNOSIS — R918 Other nonspecific abnormal finding of lung field: Secondary | ICD-10-CM | POA: Diagnosis not present

## 2022-09-07 DIAGNOSIS — G40909 Epilepsy, unspecified, not intractable, without status epilepticus: Secondary | ICD-10-CM | POA: Diagnosis not present

## 2022-09-07 DIAGNOSIS — Z8673 Personal history of transient ischemic attack (TIA), and cerebral infarction without residual deficits: Secondary | ICD-10-CM | POA: Diagnosis not present

## 2022-09-07 DIAGNOSIS — Z885 Allergy status to narcotic agent status: Secondary | ICD-10-CM

## 2022-09-07 DIAGNOSIS — Z8249 Family history of ischemic heart disease and other diseases of the circulatory system: Secondary | ICD-10-CM | POA: Diagnosis not present

## 2022-09-07 DIAGNOSIS — R55 Syncope and collapse: Secondary | ICD-10-CM | POA: Diagnosis not present

## 2022-09-07 DIAGNOSIS — S0990XA Unspecified injury of head, initial encounter: Secondary | ICD-10-CM | POA: Diagnosis not present

## 2022-09-07 DIAGNOSIS — F17210 Nicotine dependence, cigarettes, uncomplicated: Secondary | ICD-10-CM | POA: Diagnosis not present

## 2022-09-07 DIAGNOSIS — S2241XA Multiple fractures of ribs, right side, initial encounter for closed fracture: Secondary | ICD-10-CM

## 2022-09-07 DIAGNOSIS — Z888 Allergy status to other drugs, medicaments and biological substances status: Secondary | ICD-10-CM | POA: Diagnosis not present

## 2022-09-07 DIAGNOSIS — K529 Noninfective gastroenteritis and colitis, unspecified: Secondary | ICD-10-CM | POA: Diagnosis present

## 2022-09-07 DIAGNOSIS — Z720 Tobacco use: Secondary | ICD-10-CM | POA: Diagnosis present

## 2022-09-07 DIAGNOSIS — R634 Abnormal weight loss: Secondary | ICD-10-CM | POA: Diagnosis present

## 2022-09-07 DIAGNOSIS — Z79899 Other long term (current) drug therapy: Secondary | ICD-10-CM | POA: Diagnosis not present

## 2022-09-07 DIAGNOSIS — F319 Bipolar disorder, unspecified: Secondary | ICD-10-CM | POA: Diagnosis not present

## 2022-09-07 DIAGNOSIS — Z7951 Long term (current) use of inhaled steroids: Secondary | ICD-10-CM | POA: Diagnosis not present

## 2022-09-07 DIAGNOSIS — I10 Essential (primary) hypertension: Secondary | ICD-10-CM | POA: Diagnosis not present

## 2022-09-07 DIAGNOSIS — I6523 Occlusion and stenosis of bilateral carotid arteries: Secondary | ICD-10-CM | POA: Diagnosis not present

## 2022-09-07 DIAGNOSIS — J4489 Other specified chronic obstructive pulmonary disease: Secondary | ICD-10-CM | POA: Diagnosis present

## 2022-09-07 DIAGNOSIS — I2510 Atherosclerotic heart disease of native coronary artery without angina pectoris: Secondary | ICD-10-CM | POA: Diagnosis not present

## 2022-09-07 DIAGNOSIS — R296 Repeated falls: Secondary | ICD-10-CM | POA: Diagnosis not present

## 2022-09-07 DIAGNOSIS — I639 Cerebral infarction, unspecified: Secondary | ICD-10-CM | POA: Diagnosis not present

## 2022-09-07 DIAGNOSIS — S299XXA Unspecified injury of thorax, initial encounter: Secondary | ICD-10-CM | POA: Diagnosis not present

## 2022-09-07 DIAGNOSIS — R079 Chest pain, unspecified: Secondary | ICD-10-CM | POA: Diagnosis not present

## 2022-09-07 DIAGNOSIS — Z6820 Body mass index (BMI) 20.0-20.9, adult: Secondary | ICD-10-CM

## 2022-09-07 HISTORY — DX: Unspecified fall, initial encounter: W19.XXXA

## 2022-09-07 HISTORY — DX: Multiple fractures of ribs, unspecified side, initial encounter for closed fracture: S22.49XA

## 2022-09-07 HISTORY — DX: Facial weakness: R29.810

## 2022-09-07 HISTORY — DX: Bradycardia, unspecified: R00.1

## 2022-09-07 HISTORY — DX: Syncope and collapse: R55

## 2022-09-07 LAB — BASIC METABOLIC PANEL
Anion gap: 8 (ref 5–15)
BUN: 6 mg/dL — ABNORMAL LOW (ref 8–23)
CO2: 29 mmol/L (ref 22–32)
Calcium: 9.2 mg/dL (ref 8.9–10.3)
Chloride: 101 mmol/L (ref 98–111)
Creatinine, Ser: 0.91 mg/dL (ref 0.61–1.24)
GFR, Estimated: >60 mL/min (ref >60)
Glucose, Bld: 110 mg/dL — ABNORMAL HIGH (ref 70–99)
Potassium: 3.9 mmol/L (ref 3.5–5.1)
Sodium: 138 mmol/L (ref 135–145)

## 2022-09-07 LAB — URINALYSIS, ROUTINE W REFLEX MICROSCOPIC
Bacteria, UA: NONE SEEN
Bilirubin Urine: NEGATIVE
Glucose, UA: NEGATIVE mg/dL
Hgb urine dipstick: NEGATIVE
Ketones, ur: 5 mg/dL — AB
Leukocytes,Ua: NEGATIVE
Nitrite: NEGATIVE
Protein, ur: 30 mg/dL — AB
Specific Gravity, Urine: 1.043 — ABNORMAL HIGH (ref 1.005–1.030)
pH: 7 (ref 5.0–8.0)

## 2022-09-07 LAB — D-DIMER, QUANTITATIVE: D-Dimer, Quant: 0.55 ug/mL-FEU — ABNORMAL HIGH (ref 0.00–0.50)

## 2022-09-07 LAB — CBC
HCT: 40.7 % (ref 39.0–52.0)
Hemoglobin: 14.4 g/dL (ref 13.0–17.0)
MCH: 32.2 pg (ref 26.0–34.0)
MCHC: 35.4 g/dL (ref 30.0–36.0)
MCV: 91.1 fL (ref 80.0–100.0)
Platelets: 265 10*3/uL (ref 150–400)
RBC: 4.47 MIL/uL (ref 4.22–5.81)
RDW: 13.2 % (ref 11.5–15.5)
WBC: 7.4 10*3/uL (ref 4.0–10.5)
nRBC: 0 % (ref 0.0–0.2)

## 2022-09-07 LAB — TROPONIN I (HIGH SENSITIVITY)
Troponin I (High Sensitivity): 2 ng/L (ref <18)
Troponin I (High Sensitivity): <2 ng/L (ref <18)

## 2022-09-07 LAB — BRAIN NATRIURETIC PEPTIDE: B Natriuretic Peptide: 66 pg/mL (ref 0.0–100.0)

## 2022-09-07 LAB — ETHANOL: Alcohol, Ethyl (B): <10 mg/dL (ref <10)

## 2022-09-07 MED ORDER — HYDROMORPHONE HCL 1 MG/ML IJ SOLN
0.5000 mg | Freq: Once | INTRAMUSCULAR | Status: AC
  Administered 2022-09-07: 0.5 mg via INTRAVENOUS
  Filled 2022-09-07: qty 0.5

## 2022-09-07 MED ORDER — LACTATED RINGERS IV BOLUS
1000.0000 mL | Freq: Once | INTRAVENOUS | Status: AC
  Administered 2022-09-07: 1000 mL via INTRAVENOUS

## 2022-09-07 MED ORDER — SODIUM CHLORIDE 0.9% FLUSH
3.0000 mL | Freq: Two times a day (BID) | INTRAVENOUS | Status: DC
  Administered 2022-09-07 – 2022-09-09 (×4): 3 mL via INTRAVENOUS

## 2022-09-07 MED ORDER — ALBUTEROL SULFATE HFA 108 (90 BASE) MCG/ACT IN AERS: 2.0000 | INHALATION_SPRAY | RESPIRATORY_TRACT | Status: DC | PRN

## 2022-09-07 MED ORDER — MORPHINE SULFATE (PF) 2 MG/ML IV SOLN
2.0000 mg | INTRAVENOUS | Status: DC | PRN
  Administered 2022-09-07 – 2022-09-09 (×5): 2 mg via INTRAVENOUS
  Filled 2022-09-07 (×5): qty 1

## 2022-09-07 MED ORDER — NICOTINE 21 MG/24HR TD PT24
21.0000 mg | MEDICATED_PATCH | Freq: Every day | TRANSDERMAL | Status: DC
  Administered 2022-09-07 – 2022-09-09 (×3): 21 mg via TRANSDERMAL
  Filled 2022-09-07 (×3): qty 1

## 2022-09-07 MED ORDER — TRAZODONE HCL 50 MG PO TABS
100.0000 mg | ORAL_TABLET | Freq: Every evening | ORAL | Status: DC | PRN
  Administered 2022-09-07 – 2022-09-09 (×2): 100 mg via ORAL
  Filled 2022-09-07 (×2): qty 2

## 2022-09-07 MED ORDER — SODIUM CHLORIDE 0.9 % IV SOLN: INTRAVENOUS | Status: DC

## 2022-09-07 MED ORDER — PROCHLORPERAZINE EDISYLATE 10 MG/2ML IJ SOLN
10.0000 mg | Freq: Four times a day (QID) | INTRAMUSCULAR | Status: DC | PRN
  Administered 2022-09-08: 10 mg via INTRAVENOUS
  Filled 2022-09-07: qty 2

## 2022-09-07 MED ORDER — HEPARIN SODIUM (PORCINE) 5000 UNIT/ML IJ SOLN
5000.0000 [IU] | Freq: Three times a day (TID) | INTRAMUSCULAR | Status: DC
  Administered 2022-09-07 – 2022-09-09 (×5): 5000 [IU] via SUBCUTANEOUS
  Filled 2022-09-07 (×5): qty 1

## 2022-09-07 MED ORDER — ONDANSETRON HCL 4 MG/2ML IJ SOLN
4.0000 mg | Freq: Once | INTRAMUSCULAR | Status: AC
  Administered 2022-09-07: 4 mg via INTRAVENOUS
  Filled 2022-09-07: qty 2

## 2022-09-07 MED ORDER — OXYCODONE HCL 5 MG PO TABS
5.0000 mg | ORAL_TABLET | ORAL | Status: DC | PRN
  Administered 2022-09-08 – 2022-09-09 (×5): 5 mg via ORAL
  Filled 2022-09-07 (×6): qty 1

## 2022-09-07 MED ORDER — MOMETASONE FURO-FORMOTEROL FUM 200-5 MCG/ACT IN AERO
2.0000 | INHALATION_SPRAY | Freq: Two times a day (BID) | RESPIRATORY_TRACT | Status: DC
  Administered 2022-09-07 – 2022-09-09 (×4): 2 via RESPIRATORY_TRACT
  Filled 2022-09-07: qty 8.8

## 2022-09-07 MED ORDER — KETOROLAC TROMETHAMINE 15 MG/ML IJ SOLN
15.0000 mg | Freq: Once | INTRAMUSCULAR | Status: AC
  Administered 2022-09-07: 15 mg via INTRAVENOUS
  Filled 2022-09-07: qty 1

## 2022-09-07 MED ORDER — IOHEXOL 350 MG/ML SOLN
75.0000 mL | Freq: Once | INTRAVENOUS | Status: AC | PRN
  Administered 2022-09-07: 75 mL via INTRAVENOUS

## 2022-09-07 MED ORDER — LEVETIRACETAM 500 MG PO TABS
1000.0000 mg | ORAL_TABLET | Freq: Two times a day (BID) | ORAL | Status: DC
  Administered 2022-09-07 – 2022-09-09 (×4): 1000 mg via ORAL
  Filled 2022-09-07 (×4): qty 2

## 2022-09-07 MED ORDER — LEVETIRACETAM 500 MG PO TABS
1000.0000 mg | ORAL_TABLET | Freq: Once | ORAL | Status: AC
  Administered 2022-09-07: 1000 mg via ORAL
  Filled 2022-09-07: qty 2

## 2022-09-07 MED ORDER — ACETAMINOPHEN 650 MG RE SUPP: 650.0000 mg | Freq: Four times a day (QID) | RECTAL | Status: DC | PRN

## 2022-09-07 MED ORDER — ALBUTEROL SULFATE (2.5 MG/3ML) 0.083% IN NEBU: 2.5000 mg | INHALATION_SOLUTION | Freq: Four times a day (QID) | RESPIRATORY_TRACT | Status: DC | PRN

## 2022-09-07 MED ORDER — ACETAMINOPHEN 325 MG PO TABS
650.0000 mg | ORAL_TABLET | Freq: Four times a day (QID) | ORAL | Status: DC | PRN
  Administered 2022-09-09: 650 mg via ORAL
  Filled 2022-09-07: qty 2

## 2022-09-07 MED ORDER — ROSUVASTATIN CALCIUM 10 MG PO TABS
10.0000 mg | ORAL_TABLET | Freq: Every day | ORAL | Status: DC
  Administered 2022-09-07 – 2022-09-09 (×3): 10 mg via ORAL
  Filled 2022-09-07 (×3): qty 1

## 2022-09-07 NOTE — H&P (Signed)
History and Physical    Patient: Spencer Mendoza UKG:[id number] DOB: [birth year] DOA: 09/07/2022 DOS: the patient was seen and examined on 09/07/2022 PCP: Jeanie Sewer, NP  Patient coming from: Home  Chief Complaint: No chief complaint on file.  HPI: Spencer Mendoza is a 61 y.o. male with medical history significant of polysubstance abuse, CAD, epilepsy, stroke, COPD, tobacco use disorder, and more presents the ED with a chief complaint of blacking out.  Of note, patient initially answers yes to every review of system you ask, when you get more detailed you find out that he is answering for things that happened in the remote history, and not always answering things that happened recently.  Patient reports that he has been blacking out for couple of weeks.  It happened 4 out of the last 5 nights.  He gets up slow, because he knows that he is at risk of passing out, and he wakes up on the floor.  Not sure how long he is out.  He has hit his head several times.  He reports blurry vision since this started.  He reports dysarthria at times.  He is also had some right facial droop and paresthesias at times.  He does not have those things now.  Patient reports he has had chest pain on the right side of his chest where his ribs are broken.  His chest pain is worse with positional changes.  It is worse with a deep breath.  He reports that since having his last fall he does find it difficult to breathe.  It is worse when laying flat.  He has noticed himself wheezing and reports he has been having to use his inhaler every day.  He uses his Symbicort in the a.m. and his rescue inhaler in the pm.  Patient reports that he has had a cough that is productive of green/white sputum.  He felt feverish last week but that went away spontaneously.  He has had sick contacts with kids that are in his home having strep throat.  Patient does have a history of seizures and has a recognizable aura.  He reports he has not  had an aura recently and nobody has reported to him any convulsive activity while he is out.  Patient has no other complaints at this time.    Patient does smoke.  Does drink alcohol every other day 2 beers.  Does use marijuana last used 2 days ago.  He does not use other illicit drugs per his report.  He is full code.  He reports he would not want to be on life support long-term. Review of Systems: As mentioned in the history of present illness. All other systems reviewed and are negative. Past Medical History:  Diagnosis Date   Abdominal pain 08/30/2019   Alcohol withdrawal ([place]) 07/29/2013   Allergy    Anxiety    Arthritis    Benzodiazepine abuse ([place]) 08/30/2019   Bipolar disorder ([place])    CAD (coronary artery disease)    Chronic insomnia 06/06/2015   Chronic migraine without aura, with intractable migraine, so stated, with status migrainosus 08/25/2019   Claustrophobia    Cocaine abuse ([place]) 05/22/2015   COPD (chronic obstructive pulmonary disease) ([place])    Delirium tremens ([place]) 07/29/2013   Epilepsy ([place])    last seizure week of 06-25-2020- sev. grand mal seizures per pt    Hemiplegic migraine with status migrainosus 06/06/2015   Hemiplegic migraine with status migrainosus 06/06/2015   Migraine  Opioid use disorder 08/30/2019   PONV (postoperative nausea and vomiting)    Stroke [facility])    patient denies- states had a hemiplegic migraine in ~2011   Substance abuse ([place])    Past Surgical History:  Procedure Laterality Date   ANKLE SURGERY     in high school   APPENDECTOMY     COLONOSCOPY     KNEE ARTHROSCOPY Right    NOSE SURGERY     REVERSE SHOULDER ARTHROPLASTY Right 03/27/2022   Procedure: REVERSE SHOULDER ARTHROPLASTY;  Surgeon: Hiram Gash, MD;  Location: [place] [facility];  Service: Orthopedics;  Laterality: Right;   SHOULDER ARTHROSCOPY WITH DISTAL CLAVICLE RESECTION Right 12/04/2021   Procedure: SHOULDER ARTHROSCOPY WITH DISTAL CLAVICLE EXCISION;  Surgeon:  Hiram Gash, MD;  Location: [place];  Service: Orthopedics;  Laterality: Right;   SHOULDER ARTHROSCOPY WITH SUBACROMIAL DECOMPRESSION, ROTATOR CUFF REPAIR AND BICEP TENDON REPAIR Right 12/04/2021   Procedure: SHOULDER ARTHROSCOPY WITH SUBACROMIAL DECOMPRESSION, ROTATOR CUFF REPAIR AND BICEP TENDON REPAIR;  Surgeon: Hiram Gash, MD;  Location: [place];  Service: Orthopedics;  Laterality: Right;   Social History:  reports that he has been smoking cigarettes. He has a 3.75 pack-year smoking history. He has never used smokeless tobacco. He reports current alcohol use of about 6.0 standard drinks of alcohol per week. He reports current drug use. Drug: Marijuana.  Allergies  Allergen Reactions   Sertraline Other (See Comments)    Erectile dysfunction   Codeine Nausea And Vomiting   Depakote [Divalproex Sodium] Other (See Comments)    Made patient shake   Migranal [Dihydroergotamine] Nausea And Vomiting   Topamax [Topiramate] Other (See Comments)    Made patient angry    Family History  Problem Relation Age of Onset   Heart disease Mother    Stroke Mother    Epilepsy Mother    Heart failure Mother    Cancer Father    Heart disease Father    Hypertension Father    Melanoma Father    Migraines Father    Colon cancer Sister 9   Colon cancer Cousin    Cancer Cousin    Colon polyps Neg Hx    Esophageal cancer Neg Hx    Rectal cancer Neg Hx    Stomach cancer Neg Hx     Prior to Admission medications   Medication Sig Start Date End Date Taking? Authorizing Provider  acetaminophen (TYLENOL) 500 MG tablet Take 1,000 mg by mouth 2 (two) times daily as needed (pain).   Yes [provider]  albuterol (VENTOLIN HFA) 108 (90 Base) MCG/ACT inhaler INHALE 2 PUFFS BY MOUTH INTO THE LUNGS EVERY 4 HOURS AS NEEDED FOR SHORTNESS OF BREATH OR WHEEZING Patient taking differently: Inhale 2 puffs into the lungs as needed for wheezing or shortness of breath.  03/28/22  Yes Jeanie Sewer, NP  budesonide-formoterol (SYMBICORT) 160-4.5 MCG/ACT inhaler Inhale 2 puffs into the lungs 2 (two) times daily. 03/25/22  Yes Jeanie Sewer, NP  levETIRAcetam (KEPPRA) 1000 MG tablet Take 1 tablet (1,000 mg total) by mouth 2 (two) times daily. 06/26/22  Yes Domenic Moras, PA-C  traZODone (DESYREL) 100 MG tablet TAKE 1 TO 2 TABLETS(100 TO 200 MG) BY MOUTH AT BEDTIME 06/12/22  Yes Hudnell, Stephanie, NP  acidophilus (RISAQUAD) CAPS capsule Take 1 capsule by mouth daily.    [provider]  celecoxib (CELEBREX) 200 MG capsule Take 1 capsule (200 mg total) by mouth 2 (two) times daily. 07/02/22  Margarita Mail, PA-C  meclizine (ANTIVERT) 25 MG tablet Take 1 tablet (25 mg total) by mouth every 8 (eight) hours as needed for nausea. Patient not taking: Reported on 08/01/2022 03/27/22   Ethelda Chick, PA-C  rosuvastatin (CRESTOR) 10 MG tablet Take 1 tablet (10 mg total) by mouth daily. Patient not taking: Reported on 09/07/2022 02/28/22 02/23/23  Loel Dubonnet, NP  traMADol (ULTRAM) 50 MG tablet Take 50 mg by mouth every 6 (six) hours as needed. Patient not taking: Reported on 09/07/2022 07/09/22   [provider]    Physical Exam: Vitals:   09/07/22 1930 09/07/22 1934 09/07/22 1935 09/07/22 1937  BP:  (!) 138/96 (!) 144/89   Pulse: (!) 51 (!) 47 (!) 51   Resp: '14 14 16   '$ Temp:    97.6 F (36.4 C)  TempSrc:    Oral  SpO2: 100% 100% 99%   Weight:      Height:       1.  General: Patient lying supine in bed,  no acute distress   2. Psychiatric: Alert and oriented x 3, mood and behavior normal for situation, pleasant and cooperative with exam   3. Neurologic: Speech and language are normal, face is symmetric, moves all 4 extremities voluntarily, at baseline without acute deficits on limited exam   4. HEENMT:  Head is atraumatic, normocephalic, pupils reactive to light, neck is supple, trachea is midline, mucous membranes are moist   5.  Respiratory : Lungs are clear to auscultation bilaterally without wheezing, rhonchi, rales, no cyanosis, no increase in work of breathing or accessory muscle use   6. Cardiovascular : Heart rate bradycardic, rhythm is regular, no murmurs, rubs or gallops, no peripheral edema, peripheral pulses palpated   7. Gastrointestinal:  Abdomen is soft, nondistended, nontender to palpation bowel sounds active, no masses or organomegaly palpated   8. Skin:  Skin is warm, dry and intact without rashes, acute lesions, or ulcers on limited exam   9.Musculoskeletal:  Chest is painful to palpation lateral to the mid clavicular line on the right, no asymmetry in tone, no peripheral edema, peripheral pulses palpated, no tenderness to palpation in the extremities  Data Reviewed: Temp 98, heart 64, respiratory rate 18, blood pressure 123/85, satting at 99% No leukocytosis with a white blood cell count of 7.4, hemoglobin 14.4, platelets 265 Chemistry is unremarkable Troponin initially 2 and then undetectable CT chest shows displaced fracture of the anterior right third rib.  Subacute fractures of the fourth through sixth rib.  CT head shows no acute changes.  Chest x-ray shows patchy opacity in the right upper lobe.  Right shoulder injury that was previously noted.  No acute changes. D-dimer was positive so CTA was done that showed no PE.  Anterior right third rib fracture displaced at least half shaft width.  Nondisplaced anterior fourth and fifth rib fractures that are also likely acute.  More remote right fourth and fifth as well as 6th rib fracture seen as well. Patient was given 2 rounds of Dilaudid, Toradol, 1 L LR, Keppra 1 g, and Zofran EKG showed bradycardia with QTc 415 Orthostatic vital signs were not abnormal Admission was requested for multiple episodes of syncope Assessment and Plan: * Syncope - History is consistent with orthostatics - Patient's orthostatic vital signs are not positive -  Possible bradycardia contributing? - Monitor on telemetry - Echo in the a.m. - Consult cardiology - CTA chest ruled out PE - Continue to monitor  Rib fractures -  Acute and subacute rib fractures -Trauma consulted and advised patient to stay here any pain with incentive spirometer - Incentive spirometer ordered - Pain control with pain scale - Continue to monitor  Facial droop - Reported facial droop with paresthesias on the right at home - Combined with multiple episodes of syncope and collapse - MRI brain in the a.m. - CT head showed no acute findings tonight  Sinus bradycardia - EKG showing heart rate 49 bradycardia, QTc 415 - Last EKG was still bradycardic with a heart rate of 60 - Hold any beta-blocker - Monitor on telemetry - Consult cardiology - Continue to monitor  Fall at home, initial encounter - PT eval and treat - Most likely secondary to syncope - Monitor on telemetry - Patient has been bradycardic- consult cardiology - Continue to monitor  COPD with chronic bronchitis - Continue Dulera - As needed albuterol - Not currently in exacerbation - Continue to monitor  Tobacco use - Nicotine patch ordered - Counseled on the importance of cessation  Seizures ([place]) - Continue Keppra      Advance Care Planning:   Code Status: Full Code   Consults: Cardiology  Family Communication: No family at bedside  Severity of Illness: The appropriate patient status for this patient is OBSERVATION. Observation status is judged to be reasonable and necessary in order to provide the required intensity of service to ensure the patient's safety. The patient's presenting symptoms, physical exam findings, and initial radiographic and laboratory data in the context of their medical condition is felt to place them at decreased risk for further clinical deterioration. Furthermore, it is anticipated that the patient will be medically stable for discharge from the hospital within  2 midnights of admission.   Author: Rolla Plate, DO 09/07/2022 9:25 PM  For on call review [email or web address].

## 2022-09-07 NOTE — Assessment & Plan Note (Signed)
-   Reported facial droop with paresthesias on the right at home - Combined with multiple episodes of syncope and collapse - MRI brain in the a.m. - CT head showed no acute findings tonight

## 2022-09-07 NOTE — ED Notes (Signed)
Hospitalist at bedside 

## 2022-09-07 NOTE — Assessment & Plan Note (Signed)
-   History is consistent with orthostatics - Patient's orthostatic vital signs are not positive - Possible bradycardia contributing? - Monitor on telemetry - Echo in the a.m. - Consult cardiology - CTA chest ruled out PE - Continue to monitor

## 2022-09-07 NOTE — Assessment & Plan Note (Signed)
-   PT eval and treat - Most likely secondary to syncope - Monitor on telemetry - Patient has been bradycardic- consult cardiology - Continue to monitor

## 2022-09-07 NOTE — ED Notes (Signed)
Patient transported to CT 

## 2022-09-07 NOTE — Assessment & Plan Note (Signed)
-   Nicotine patch ordered - Counseled on the importance of cessation

## 2022-09-07 NOTE — Assessment & Plan Note (Signed)
-   EKG showing heart rate 49 bradycardia, QTc 415 - Last EKG was still bradycardic with a heart rate of 60 - Hold any beta-blocker - Monitor on telemetry - Consult cardiology - Continue to monitor

## 2022-09-07 NOTE — Progress Notes (Signed)
Consulted for admission by ED provider. Will likely admit here to [facility], but deferring until ED provider has been able to touch base with trauma for recs given multiple, acute, right sided rib fractures.

## 2022-09-07 NOTE — ED Provider Notes (Signed)
[facility] EMERGENCY DEPARTMENT Provider Note  CSN: [id number] Arrival date & time: 09/07/22 1203  Chief Complaint(s) No chief complaint on file.  HPI Spencer Mendoza is a 61 y.o. male with history of coronary artery disease, bipolar disorder, COPD, epilepsy presenting to the emergency department with syncope.  Patient reports for the last week he has had multiple episodes of syncope at night.  He reports this happens after getting out of the bed.  He develops lightheadedness and then loses consciousness.  He fell last night and landed on his right chest, has pain in his right chest, previously was not having chest pain.  He also reports some right shoulder pain.  Denies preceding palpitations, nausea or vomiting, chest pain.  His sister reports that she hears him fall and he seems sometimes to call to arouse or confused afterwards.  No tongue biting.  He reports compliance with his Keppra.   Past Medical History Past Medical History:  Diagnosis Date   Abdominal pain 08/30/2019   Alcohol withdrawal ([place]) 07/29/2013   Allergy    Anxiety    Arthritis    Benzodiazepine abuse ([place]) 08/30/2019   Bipolar disorder ([place])    CAD (coronary artery disease)    Chronic insomnia 06/06/2015   Chronic migraine without aura, with intractable migraine, so stated, with status migrainosus 08/25/2019   Claustrophobia    Cocaine abuse ([place]) 05/22/2015   COPD (chronic obstructive pulmonary disease) ([place])    Delirium tremens ([place]) 07/29/2013   Epilepsy ([place])    last seizure week of 06-25-2020- sev. grand mal seizures per pt    Hemiplegic migraine with status migrainosus 06/06/2015   Hemiplegic migraine with status migrainosus 06/06/2015   Migraine    Opioid use disorder 08/30/2019   PONV (postoperative nausea and vomiting)    Stroke [facility])    patient denies- states had a hemiplegic migraine in ~2011   Substance abuse ([place])    Patient Active Problem List   Diagnosis Date Noted   Syncope 09/07/2022    Alcohol abuse with withdrawal, uncomplicated ([place]) 60/63/0160   Coronary artery disease of native artery of native heart with stable angina pectoris ([place]) 02/03/2022   Prolonged Q-T interval on ECG    Intentional overdose ([place]) 08/26/2021   Exanthem due to herpes zoster 04/01/2021   Abdominal aortic atherosclerosis ([place]) with 50% occlusion distal aorta 03/05/2020   Osteoarthritis of left wrist 01/04/2020   Liver hemangioma 09/21/2019   Tobacco use 08/30/2019   COPD with chronic bronchitis 08/30/2019   Chronic migraine without aura, with intractable migraine, so stated, with status migrainosus 08/25/2019   MDD (major depressive disorder), recurrent severe, without psychosis ([place]) 11/18/2018   Chronic insomnia 06/06/2015   Seizures ([place]) 01/09/2014   Chronic low back pain 12/28/2013   History of stroke 12/04/2013   Hypertension 12/04/2013   Home Medication(s) Prior to Admission medications   Medication Sig Start Date End Date Taking? Authorizing Provider  acetaminophen (TYLENOL) 500 MG tablet Take 1,000 mg by mouth 2 (two) times daily as needed (pain).    [provider]  albuterol (VENTOLIN HFA) 108 (90 Base) MCG/ACT inhaler INHALE 2 PUFFS BY MOUTH INTO THE LUNGS EVERY 4 HOURS AS NEEDED FOR SHORTNESS OF BREATH OR WHEEZING Patient taking differently: Inhale 2 puffs into the lungs as needed for wheezing or shortness of breath. 03/28/22   Jeanie Sewer, NP  budesonide-formoterol (SYMBICORT) 160-4.5 MCG/ACT inhaler Inhale 2 puffs into the lungs 2 (two) times daily. 03/25/22   Jeanie Sewer, NP  celecoxib (CELEBREX) 200 MG capsule Take 1 capsule (200 mg total) by mouth 2 (two) times daily. 07/02/22   Harris, Vernie Shanks, PA-C  levETIRAcetam (KEPPRA) 1000 MG tablet Take 1 tablet (1,000 mg total) by mouth 2 (two) times daily. 06/26/22   Domenic Moras, PA-C  meclizine (ANTIVERT) 25 MG tablet Take 1 tablet (25 mg total) by mouth every 8 (eight) hours as needed for nausea. Patient not  taking: Reported on 08/01/2022 03/27/22   Ethelda Chick, PA-C  rosuvastatin (CRESTOR) 10 MG tablet Take 1 tablet (10 mg total) by mouth daily. 02/28/22 02/23/23  Loel Dubonnet, NP  traZODone (DESYREL) 100 MG tablet TAKE 1 TO 2 TABLETS(100 TO 200 MG) BY MOUTH AT BEDTIME 06/12/22   Jeanie Sewer, NP                                                                                                                                    Past Surgical History Past Surgical History:  Procedure Laterality Date   ANKLE SURGERY     in high school   APPENDECTOMY     COLONOSCOPY     KNEE ARTHROSCOPY Right    NOSE SURGERY     REVERSE SHOULDER ARTHROPLASTY Right 03/27/2022   Procedure: REVERSE SHOULDER ARTHROPLASTY;  Surgeon: Hiram Gash, MD;  Location: [place];  Service: Orthopedics;  Laterality: Right;   SHOULDER ARTHROSCOPY WITH DISTAL CLAVICLE RESECTION Right 12/04/2021   Procedure: SHOULDER ARTHROSCOPY WITH DISTAL CLAVICLE EXCISION;  Surgeon: Hiram Gash, MD;  Location: [place];  Service: Orthopedics;  Laterality: Right;   SHOULDER ARTHROSCOPY WITH SUBACROMIAL DECOMPRESSION, ROTATOR CUFF REPAIR AND BICEP TENDON REPAIR Right 12/04/2021   Procedure: SHOULDER ARTHROSCOPY WITH SUBACROMIAL DECOMPRESSION, ROTATOR CUFF REPAIR AND BICEP TENDON REPAIR;  Surgeon: Hiram Gash, MD;  Location: [place];  Service: Orthopedics;  Laterality: Right;   Family History Family History  Problem Relation Age of Onset   Heart disease Mother    Stroke Mother    Epilepsy Mother    Heart failure Mother    Cancer Father    Heart disease Father    Hypertension Father    Melanoma Father    Migraines Father    Colon cancer Sister 28   Colon cancer Cousin    Cancer Cousin    Colon polyps Neg Hx    Esophageal cancer Neg Hx    Rectal cancer Neg Hx    Stomach cancer Neg Hx     Social History Social History   Tobacco Use   Smoking status: Every Day     Packs/day: 0.25    Years: 15.00    Total pack years: 3.75    Types: Cigarettes   Smokeless tobacco: Never   Tobacco comments:    States he quit 02-25-22  Vaping Use   Vaping Use: Never used  Substance Use Topics   Alcohol use: Yes  Alcohol/week: 6.0 standard drinks of alcohol    Types: 6 Cans of beer per week    Comment: one a night   Drug use: Yes    Types: Marijuana    Comment: daily   Allergies Sertraline, Codeine, Depakote [divalproex sodium], Migranal [dihydroergotamine], and Topamax [topiramate]  Review of Systems Review of Systems  All other systems reviewed and are negative.   Physical Exam Vital Signs  I have reviewed the triage vital signs BP 138/87   Pulse (!) 56   Temp 98 F (36.7 C) (Oral)   Resp 20   Ht 6' (1.829 m)   Wt 68 kg   SpO2 100%   BMI 20.34 kg/m  Physical Exam Vitals and nursing note reviewed.  Constitutional:      General: He is not in acute distress.    Appearance: Normal appearance.  HENT:     Mouth/Throat:     Mouth: Mucous membranes are moist.  Eyes:     Conjunctiva/sclera: Conjunctivae normal.  Cardiovascular:     Rate and Rhythm: Normal rate and regular rhythm.  Pulmonary:     Effort: Pulmonary effort is normal. No respiratory distress.     Breath sounds: Normal breath sounds.     Comments: Right anterior chest wall tenderness, no crepitus Abdominal:     General: Abdomen is flat.     Palpations: Abdomen is soft.     Tenderness: There is no abdominal tenderness.  Musculoskeletal:     Right lower leg: No edema.     Left lower leg: No edema.  Skin:    General: Skin is warm and dry.     Capillary Refill: Capillary refill takes less than 2 seconds.  Neurological:     General: No focal deficit present.     Mental Status: He is alert and oriented to person, place, and time. Mental status is at baseline.  Psychiatric:        Mood and Affect: Mood normal.        Behavior: Behavior normal.     ED Results and  Treatments Labs (all labs ordered are listed, but only abnormal results are displayed) Labs Reviewed  BASIC METABOLIC PANEL - Abnormal; Notable for the following components:      Result Value   Glucose, Bld 110 (*)    BUN 6 (*)    All other components within normal limits  URINALYSIS, ROUTINE W REFLEX MICROSCOPIC - Abnormal; Notable for the following components:   Specific Gravity, Urine 1.043 (*)    Ketones, ur 5 (*)    Protein, ur 30 (*)    All other components within normal limits  D-DIMER, QUANTITATIVE - Abnormal; Notable for the following components:   D-Dimer, Quant 0.55 (*)    All other components within normal limits  CBC  BRAIN NATRIURETIC PEPTIDE  TROPONIN I (HIGH SENSITIVITY)  TROPONIN I (HIGH SENSITIVITY)  Radiology CT Angio Chest PE W and/or Wo Contrast  Result Date: 09/07/2022 CLINICAL DATA:  Fall. Syncopal episode. Patient fell into a door trauma to his chest. EXAM: CT ANGIOGRAPHY CHEST WITH CONTRAST TECHNIQUE: Multidetector CT imaging of the chest was performed using the standard protocol during bolus administration of intravenous contrast. Multiplanar CT image reconstructions and MIPs were obtained to evaluate the vascular anatomy. RADIATION DOSE REDUCTION: This exam was performed according to the departmental dose-optimization program which includes automated exposure control, adjustment of the mA and/or kV according to patient size and/or use of iterative reconstruction technique. CONTRAST:  24m OMNIPAQUE IOHEXOL 350 MG/ML SOLN COMPARISON:  Chest without contrast 09/07/2022 FINDINGS: Cardiovascular: Heart size is normal. Coronary artery calcifications are present. Atherosclerotic calcifications are present in the aorta origin of the left subclavian artery. No significant stenosis or aneurysm is present. Pulmonary artery opacification is excellent. No  focal filling defects are present to suggest pulmonary embolus. Pulmonary artery size is normal. Mediastinum/Nodes: No enlarged mediastinal, hilar, or axillary lymph nodes. Thyroid gland, trachea, and esophagus demonstrate no significant findings. Lungs/Pleura: Centrilobular and paraseptal emphysematous changes are again noted bilaterally. No nodule or mass lesion is present. No focal airspace consolidation is present. No contusion is present. Airways are patent. No significant pleural disease is present. Effusion or pneumothorax. Upper Abdomen: Limited imaging the abdomen is unremarkable. There is no significant adenopathy. No solid organ lesions are present. Musculoskeletal: Anterior right third rib fracture is displaced at least a half a shaft width. Nondisplaced anterior fourth and fifth rib fractures are noted. More remote right fourth, fifth and sixth fractures are again seen laterally. Additional fractures are present. Vertebral body heights are normal. No focal osseous lesions are present. Review of the MIP images confirms the above findings. IMPRESSION: 1. No pulmonary embolus. Normal opacification of pulmonary arteries. 2. Anterior right third rib fracture is displaced at least a half a shaft width. 3. Nondisplaced anterior fourth and fifth rib fractures are likely also acute. 4. Coronary artery disease. 5. More remote right fourth, fifth and sixth fractures are again seen laterally. 6. Aortic Atherosclerosis (ICD10-I70.0) and Emphysema (ICD10-J43.9). Electronically Signed   By: CSan MorelleM.D.   On: 09/07/2022 16:42   DG Shoulder Right  Result Date: 09/07/2022 CLINICAL DATA:  Trauma, fall EXAM: RIGHT SHOULDER - 2+ VIEW COMPARISON:  06/26/2022 FINDINGS: There is previous reverse arthroplasty in right shoulder. No recent fracture or dislocation is seen. No significant interval changes are noted. IMPRESSION: Previous right shoulder arthroplasty. No recent fracture or dislocation is seen.  Electronically Signed   By: PElmer PickerM.D.   On: 09/07/2022 15:22   CT Chest Wo Contrast  Result Date: 09/07/2022 CLINICAL DATA:  Chest trauma. EXAM: CT CHEST WITHOUT CONTRAST TECHNIQUE: Multidetector CT imaging of the chest was performed following the standard protocol without IV contrast. RADIATION DOSE REDUCTION: This exam was performed according to the departmental dose-optimization program which includes automated exposure control, adjustment of the mA and/or kV according to patient size and/or use of iterative reconstruction technique. COMPARISON:  Chest CT dated 06/26/2022. FINDINGS: Cardiovascular: No thoracic aortic aneurysm. No pericardial effusion. Atherosclerosis of the thoracic aorta and coronary arteries. Mediastinum/Nodes: No mass or enlarged lymph nodes are seen within the mediastinum or perihilar regions. Esophagus appears normal. Trachea is unremarkable. Lungs/Pleura: Centrilobular and paraseptal emphysematous changes bilaterally, at least moderate in degree and upper lobe predominant. No suspicious nodule or mass. No evidence of pneumonia or pulmonary edema. No pleural effusion or pneumothorax. Upper Abdomen: No acute  abnormality. Musculoskeletal: Acute slightly displaced fracture of the anterior RIGHT third rib. Multiple subacute and chronic rib fractures involving the RIGHT fourth fifth and sixth ribs. IMPRESSION: 1. Acute slightly displaced fracture of the anterior RIGHT third rib. 2. Multiple subacute and chronic rib fractures involving the RIGHT fourth through sixth ribs. 3. No pleural effusion or pneumothorax. 4. Aortic and coronary artery atherosclerosis. Emphysema (ICD10-J43.9). Electronically Signed   By: Franki Cabot M.D.   On: 09/07/2022 15:18   CT Head Wo Contrast  Result Date: 09/07/2022 CLINICAL DATA:  Head trauma, moderate to severe. EXAM: CT HEAD WITHOUT CONTRAST TECHNIQUE: Contiguous axial images were obtained from the base of the skull through the vertex  without intravenous contrast. RADIATION DOSE REDUCTION: This exam was performed according to the departmental dose-optimization program which includes automated exposure control, adjustment of the mA and/or kV according to patient size and/or use of iterative reconstruction technique. COMPARISON:  None Available. FINDINGS: Brain: No hydrocephalus. No mass, hemorrhage, edema or other evidence of acute parenchymal abnormality. No extra-axial hemorrhage. Vascular: Chronic calcified atherosclerotic changes of the large vessels at the skull base. No unexpected hyperdense vessel. Skull: Normal. Negative for fracture or focal lesion. Sinuses/Orbits: No acute findings. Chronic-appearing mucosal thickening within the ethmoid air cells and sphenoid sinus. Other: None. IMPRESSION: 1. No acute findings. No intracranial mass, hemorrhage or edema. No skull fracture. 2. Chronic-appearing paranasal sinus disease. Electronically Signed   By: Franki Cabot M.D.   On: 09/07/2022 15:11   DG Chest 2 View  Result Date: 09/07/2022 CLINICAL DATA:  Fall and chest pain EXAM: CHEST - 2 VIEW COMPARISON:  CT chest dated 06/26/2022, chest radiograph dated 05/23/2021, right shoulder radiographs dated 06/26/2022 FINDINGS: The patient is rotated to the right. Normal lung volumes. Patchy opacity in the lateral right upper. Diffuse bilateral reticular opacities. No pleural effusion or pneumothorax. The heart size and mediastinal contours are within normal limits. Reverse total right shoulder replacement. IMPRESSION: Patchy opacity in the lateral right upper lobe may reflect a focus of pulmonary contusion in the setting of trauma. Recommend follow-up chest x-ray in 4-6 weeks to ensure resolution. Electronically Signed   By: Darrin Nipper M.D.   On: 09/07/2022 13:13    Pertinent labs & imaging results that were available during my care of the patient were reviewed by me and considered in my medical decision making (see MDM for  details).  Medications Ordered in ED Medications  HYDROmorphone (DILAUDID) injection 0.5 mg (0.5 mg Intravenous Given 09/07/22 1613)  lactated ringers bolus 1,000 mL (0 mLs Intravenous Stopped 09/07/22 1848)  ondansetron (ZOFRAN) injection 4 mg (4 mg Intravenous Given 09/07/22 1613)  iohexol (OMNIPAQUE) 350 MG/ML injection 75 mL (75 mLs Intravenous Contrast Given 09/07/22 1628)  levETIRAcetam (KEPPRA) tablet 1,000 mg (1,000 mg Oral Given 09/07/22 1703)  HYDROmorphone (DILAUDID) injection 0.5 mg (0.5 mg Intravenous Given 09/07/22 1713)  ketorolac (TORADOL) 15 MG/ML injection 15 mg (15 mg Intravenous Given 09/07/22 1715)  Procedures Procedures  (including critical care time)  Medical Decision Making / ED Course   MDM:  61 year old male presenting to the emergency department with recurrent falls.  Patient overall well-appearing with reassuring vital signs.  EKG without acute signs of ACS.  Exam notable for right chest wall tenderness.  CT chest obtained without contrast given chest wall tenderness.  Demonstrates right third anterior rib fracture with some subacute rib fractures.  No intracranial bleeding.  Has right shoulder pain but prosthesis appears intact.  Unclear cause of syncope, given it occurs after standing, possible orthostatic syncope, will check orthostatic vital signs.  Could also be vasovagal syncope in the setting of urination.  Cardiac cause of syncope on differential given past medical history but EKG reassuring and troponin negative x2.  Will obtain D-dimer to evaluate for PE.  Lower concern for seizure as a cause of his episodes as he reports a prodrome of lightheadedness and reports compliance with his antiepileptics although this is possible. Will give medications and re-assess Clinical Course as of 09/07/22 1849  [name] Sep 07, 2022  1549  Unfortunately CT chest performed earlier by [facility] provider was non-contrast and D-dimer is positive. Will need to obtain CTA chest.  [WS]  1847 CTA chest demonstrates 2 additional rib fractures.  But negative for PE.  Discussed with Dr. Grandville Silos of trauma who recommends standard care for rib fracture management.  Ordered incentive spirometry.  Given syncopal episodes, trauma, he will be admitted for further management and work-up as well as pain control [WS]    Clinical Course User Index [WS] Cristie Hem, MD     Additional history obtained: -Additional history obtained from family -External records from outside source obtained and reviewed including: Chart review including previous notes, labs, imaging, consultation notes including previous ER visit 07/02/22   Lab Tests: -I ordered, reviewed, and interpreted labs.   The pertinent results include:   Labs Reviewed  BASIC METABOLIC PANEL - Abnormal; Notable for the following components:      Result Value   Glucose, Bld 110 (*)    BUN 6 (*)    All other components within normal limits  URINALYSIS, ROUTINE W REFLEX MICROSCOPIC - Abnormal; Notable for the following components:   Specific Gravity, Urine 1.043 (*)    Ketones, ur 5 (*)    Protein, ur 30 (*)    All other components within normal limits  D-DIMER, QUANTITATIVE - Abnormal; Notable for the following components:   D-Dimer, Quant 0.55 (*)    All other components within normal limits  CBC  BRAIN NATRIURETIC PEPTIDE  TROPONIN I (HIGH SENSITIVITY)  TROPONIN I (HIGH SENSITIVITY)    Notable for elevated D-dimer  EKG   EKG Interpretation  Date/Time:  Sunday September 07 2022 15:35:57 EDT Ventricular Rate:  49 PR Interval:  180 QRS Duration: 92 QT Interval:  460 QTC Calculation: 415 R Axis:   107 Text Interpretation: Sinus bradycardia Rightward axis Septal infarct (cited on or before 26-Jun-2022) Abnormal ECG When compared with ECG of 07-Sep-2022 12:34, No significant  change was found Confirmed by Garnette Gunner [phone]) on 09/07/2022 3:49:59 PM         Imaging Studies ordered: I ordered imaging studies including CTA chest On my interpretation imaging demonstrates multiple rib fractures I independently visualized and interpreted imaging. I agree with the radiologist interpretation   Medicines ordered and prescription drug management: Meds ordered this encounter  Medications   HYDROmorphone (DILAUDID) injection 0.5 mg   lactated ringers bolus 1,000  mL   ondansetron (ZOFRAN) injection 4 mg   iohexol (OMNIPAQUE) 350 MG/ML injection 75 mL   levETIRAcetam (KEPPRA) tablet 1,000 mg   HYDROmorphone (DILAUDID) injection 0.5 mg   ketorolac (TORADOL) 15 MG/ML injection 15 mg    -I have reviewed the patients home medicines and have made adjustments as needed   Consultations Obtained: I requested consultation with the trauma surgeon,  and discussed lab and imaging findings as well as pertinent plan - they recommend: standard care including IS    Social Determinants of Health:  Diagnosis or treatment significantly limited by social determinants of health: current smoker   Reevaluation: After the interventions noted above, I reevaluated the patient and found that they have improved  Co morbidities that complicate the patient evaluation  Past Medical History:  Diagnosis Date   Abdominal pain 08/30/2019   Alcohol withdrawal ([place]) 07/29/2013   Allergy    Anxiety    Arthritis    Benzodiazepine abuse ([place]) 08/30/2019   Bipolar disorder ([place])    CAD (coronary artery disease)    Chronic insomnia 06/06/2015   Chronic migraine without aura, with intractable migraine, so stated, with status migrainosus 08/25/2019   Claustrophobia    Cocaine abuse ([place]) 05/22/2015   COPD (chronic obstructive pulmonary disease) ([place])    Delirium tremens ([place]) 07/29/2013   Epilepsy ([place])    last seizure week of 06-25-2020- sev. grand mal seizures per pt     Hemiplegic migraine with status migrainosus 06/06/2015   Hemiplegic migraine with status migrainosus 06/06/2015   Migraine    Opioid use disorder 08/30/2019   PONV (postoperative nausea and vomiting)    Stroke [facility])    patient denies- states had a hemiplegic migraine in ~2011   Substance abuse ([place])       Dispostion: Disposition decision including need for hospitalization was considered, and patient admitted to the hospital.    Final Clinical Impression(s) / ED Diagnoses Final diagnoses:  Syncope and collapse  Closed fracture of multiple ribs of right side, initial encounter     This chart was dictated using voice recognition software.  Despite best efforts to proofread,  errors can occur which can change the documentation meaning.    Cristie Hem, MD 09/07/22 [phone]

## 2022-09-07 NOTE — Assessment & Plan Note (Signed)
-   Acute and subacute rib fractures -Trauma consulted and advised patient to stay here any pain with incentive spirometer - Incentive spirometer ordered - Pain control with pain scale - Continue to monitor

## 2022-09-07 NOTE — Assessment & Plan Note (Signed)
-   Continue Dulera - As needed albuterol - Not currently in exacerbation - Continue to monitor

## 2022-09-07 NOTE — Assessment & Plan Note (Signed)
Continue Keppra.

## 2022-09-07 NOTE — ED Triage Notes (Signed)
Pt reports "blacking out" a lot. Pt states he blacked out last night and pt fell into a door and hit his head and chest. Pt complain of right chest pain. Pt states he can hear something click.   Pt reports that he has blacked out 4 out of the last 5 nights.

## 2022-09-07 NOTE — ED Provider Triage Note (Signed)
Emergency Medicine Provider Triage Evaluation Note  Spencer Mendoza , a 61 y.o. male past medical history of COPD, coronary artery disease, substance abuse, history of hemiplegic migraines was evaluated in triage.  Pt complains of syncope and fall.  Patient states he has been "blacking out" 4 out of the last previous nights.  States he gets up in the middle of the night to go to the bathroom and patient's sister heard him hit the floor.  She found him in the doorway of the restroom moaning.  Patient complains of pain of his right shoulder and right upper chest.  He feels a clicking and popping sensation with movement and breathing.  His syncope only occurs during early morning hours.  He denies any pain or injury of his back or lower extremities.  Some pain to his right chest worsening with movement and breathing with breathing.  Denies feeling shortness of breath, headache or dizziness.  No new medications.  Review of Systems  Positive: Syncope, fall, right chest pain and right shoulder pain Negative: Headache, neck pain, lower extremity pain  Physical Exam  BP 123/85   Pulse 64   Temp 98 F (36.7 C) (Oral)   Resp 18   Ht 6' (1.829 m)   Wt 68 kg   SpO2 99%   BMI 20.34 kg/m  Gen:   Awake, uncomfortable appearing Resp:  Normal effort, lung sounds diminished, splinting with deep respirations MSK:   Moves extremities without difficulty  Other:  No abrasions or ecchymosis of the chest wall.  No crepitus  Medical Decision Making  Medically screening exam initiated at 1:53 PM.  Appropriate orders placed.  Jag Lenz Aydelott was informed that the remainder of the evaluation will be completed by another provider, this initial triage assessment does not replace that evaluation, and the importance of remaining in the ED until their evaluation is complete.     Kem Parkinson, PA-C 09/07/22 1430

## 2022-09-08 ENCOUNTER — Observation Stay (HOSPITAL_COMMUNITY): Payer: Medicare HMO

## 2022-09-08 ENCOUNTER — Encounter (HOSPITAL_COMMUNITY): Payer: Self-pay | Admitting: Family Medicine

## 2022-09-08 DIAGNOSIS — Z823 Family history of stroke: Secondary | ICD-10-CM | POA: Diagnosis not present

## 2022-09-08 DIAGNOSIS — R634 Abnormal weight loss: Secondary | ICD-10-CM | POA: Diagnosis present

## 2022-09-08 DIAGNOSIS — K529 Noninfective gastroenteritis and colitis, unspecified: Secondary | ICD-10-CM | POA: Diagnosis present

## 2022-09-08 DIAGNOSIS — Z9049 Acquired absence of other specified parts of digestive tract: Secondary | ICD-10-CM | POA: Diagnosis not present

## 2022-09-08 DIAGNOSIS — I639 Cerebral infarction, unspecified: Secondary | ICD-10-CM | POA: Diagnosis not present

## 2022-09-08 DIAGNOSIS — R55 Syncope and collapse: Secondary | ICD-10-CM | POA: Diagnosis not present

## 2022-09-08 DIAGNOSIS — R001 Bradycardia, unspecified: Secondary | ICD-10-CM | POA: Diagnosis present

## 2022-09-08 DIAGNOSIS — Z7951 Long term (current) use of inhaled steroids: Secondary | ICD-10-CM | POA: Diagnosis not present

## 2022-09-08 DIAGNOSIS — R29810 Facial weakness: Secondary | ICD-10-CM | POA: Diagnosis present

## 2022-09-08 DIAGNOSIS — R296 Repeated falls: Secondary | ICD-10-CM | POA: Diagnosis present

## 2022-09-08 DIAGNOSIS — I6523 Occlusion and stenosis of bilateral carotid arteries: Secondary | ICD-10-CM | POA: Diagnosis not present

## 2022-09-08 DIAGNOSIS — S2249XA Multiple fractures of ribs, unspecified side, initial encounter for closed fracture: Secondary | ICD-10-CM

## 2022-09-08 DIAGNOSIS — G40909 Epilepsy, unspecified, not intractable, without status epilepticus: Secondary | ICD-10-CM | POA: Diagnosis present

## 2022-09-08 DIAGNOSIS — Z79899 Other long term (current) drug therapy: Secondary | ICD-10-CM | POA: Diagnosis not present

## 2022-09-08 DIAGNOSIS — J4489 Other specified chronic obstructive pulmonary disease: Secondary | ICD-10-CM | POA: Diagnosis present

## 2022-09-08 DIAGNOSIS — I2510 Atherosclerotic heart disease of native coronary artery without angina pectoris: Secondary | ICD-10-CM

## 2022-09-08 DIAGNOSIS — Z8673 Personal history of transient ischemic attack (TIA), and cerebral infarction without residual deficits: Secondary | ICD-10-CM | POA: Diagnosis not present

## 2022-09-08 DIAGNOSIS — E876 Hypokalemia: Secondary | ICD-10-CM | POA: Diagnosis present

## 2022-09-08 DIAGNOSIS — Z96611 Presence of right artificial shoulder joint: Secondary | ICD-10-CM | POA: Diagnosis present

## 2022-09-08 DIAGNOSIS — Y92009 Unspecified place in unspecified non-institutional (private) residence as the place of occurrence of the external cause: Secondary | ICD-10-CM | POA: Diagnosis not present

## 2022-09-08 DIAGNOSIS — F17210 Nicotine dependence, cigarettes, uncomplicated: Secondary | ICD-10-CM | POA: Diagnosis present

## 2022-09-08 DIAGNOSIS — S2241XA Multiple fractures of ribs, right side, initial encounter for closed fracture: Secondary | ICD-10-CM | POA: Diagnosis present

## 2022-09-08 DIAGNOSIS — F319 Bipolar disorder, unspecified: Secondary | ICD-10-CM | POA: Diagnosis present

## 2022-09-08 DIAGNOSIS — Z820 Family history of epilepsy and other diseases of the nervous system: Secondary | ICD-10-CM | POA: Diagnosis not present

## 2022-09-08 DIAGNOSIS — Z888 Allergy status to other drugs, medicaments and biological substances status: Secondary | ICD-10-CM | POA: Diagnosis not present

## 2022-09-08 DIAGNOSIS — W19XXXA Unspecified fall, initial encounter: Secondary | ICD-10-CM | POA: Diagnosis present

## 2022-09-08 DIAGNOSIS — Z8249 Family history of ischemic heart disease and other diseases of the circulatory system: Secondary | ICD-10-CM | POA: Diagnosis not present

## 2022-09-08 DIAGNOSIS — I10 Essential (primary) hypertension: Secondary | ICD-10-CM | POA: Diagnosis present

## 2022-09-08 DIAGNOSIS — Z635 Disruption of family by separation and divorce: Secondary | ICD-10-CM | POA: Diagnosis not present

## 2022-09-08 HISTORY — DX: Multiple fractures of ribs, unspecified side, initial encounter for closed fracture: S22.49XA

## 2022-09-08 LAB — ECHOCARDIOGRAM COMPLETE
AR max vel: 2.83 cm2
AV Area VTI: 2.87 cm2
AV Area mean vel: 3.13 cm2
AV Mean grad: 2 mmHg
AV Peak grad: 6 mmHg
Ao pk vel: 1.22 m/s
Area-P 1/2: 4.08 cm2
Height: 72 in
MV VTI: 2.52 cm2
S' Lateral: 2.7 cm
Weight: 2497.37 oz

## 2022-09-08 LAB — RAPID URINE DRUG SCREEN, HOSP PERFORMED
Amphetamines: NOT DETECTED
Barbiturates: NOT DETECTED
Benzodiazepines: NOT DETECTED
Cocaine: NOT DETECTED
Opiates: POSITIVE — AB
Tetrahydrocannabinol: POSITIVE — AB

## 2022-09-08 LAB — COMPREHENSIVE METABOLIC PANEL
ALT: 11 U/L (ref 0–44)
AST: 14 U/L — ABNORMAL LOW (ref 15–41)
Albumin: 2.9 g/dL — ABNORMAL LOW (ref 3.5–5.0)
Alkaline Phosphatase: 84 U/L (ref 38–126)
Anion gap: 9 (ref 5–15)
BUN: 6 mg/dL — ABNORMAL LOW (ref 8–23)
CO2: 28 mmol/L (ref 22–32)
Calcium: 8.4 mg/dL — ABNORMAL LOW (ref 8.9–10.3)
Chloride: 101 mmol/L (ref 98–111)
Creatinine, Ser: 0.73 mg/dL (ref 0.61–1.24)
GFR, Estimated: >60 mL/min (ref >60)
Glucose, Bld: 85 mg/dL (ref 70–99)
Potassium: 3 mmol/L — ABNORMAL LOW (ref 3.5–5.1)
Sodium: 138 mmol/L (ref 135–145)
Total Bilirubin: 0.5 mg/dL (ref 0.3–1.2)
Total Protein: 5.2 g/dL — ABNORMAL LOW (ref 6.5–8.1)

## 2022-09-08 LAB — CBC WITH DIFFERENTIAL/PLATELET
Abs Immature Granulocytes: 0.01 10*3/uL (ref 0.00–0.07)
Basophils Absolute: 0 10*3/uL (ref 0.0–0.1)
Basophils Relative: 1 %
Eosinophils Absolute: 0.2 10*3/uL (ref 0.0–0.5)
Eosinophils Relative: 3 %
HCT: 35.1 % — ABNORMAL LOW (ref 39.0–52.0)
Hemoglobin: 12.1 g/dL — ABNORMAL LOW (ref 13.0–17.0)
Immature Granulocytes: 0 %
Lymphocytes Relative: 37 %
Lymphs Abs: 1.9 10*3/uL (ref 0.7–4.0)
MCH: 31.8 pg (ref 26.0–34.0)
MCHC: 34.5 g/dL (ref 30.0–36.0)
MCV: 92.4 fL (ref 80.0–100.0)
Monocytes Absolute: 0.5 10*3/uL (ref 0.1–1.0)
Monocytes Relative: 10 %
Neutro Abs: 2.5 10*3/uL (ref 1.7–7.7)
Neutrophils Relative %: 49 %
Platelets: 185 10*3/uL (ref 150–400)
RBC: 3.8 MIL/uL — ABNORMAL LOW (ref 4.22–5.81)
RDW: 13.2 % (ref 11.5–15.5)
WBC: 5 10*3/uL (ref 4.0–10.5)
nRBC: 0 % (ref 0.0–0.2)

## 2022-09-08 LAB — MAGNESIUM: Magnesium: 1.8 mg/dL (ref 1.7–2.4)

## 2022-09-08 LAB — TSH: TSH: 3.006 u[IU]/mL (ref 0.350–4.500)

## 2022-09-08 LAB — GLUCOSE, CAPILLARY: Glucose-Capillary: 89 mg/dL (ref 70–99)

## 2022-09-08 MED ORDER — HYDROCODONE BIT-HOMATROP MBR 5-1.5 MG/5ML PO SOLN
5.0000 mL | Freq: Four times a day (QID) | ORAL | Status: AC
  Administered 2022-09-08 (×3): 5 mL via ORAL
  Filled 2022-09-08 (×3): qty 5

## 2022-09-08 MED ORDER — MIDODRINE HCL 5 MG PO TABS
2.5000 mg | ORAL_TABLET | Freq: Two times a day (BID) | ORAL | Status: DC
  Administered 2022-09-08 – 2022-09-09 (×2): 2.5 mg via ORAL
  Filled 2022-09-08 (×2): qty 1

## 2022-09-08 MED ORDER — HYDROCODONE BIT-HOMATROP MBR 5-1.5 MG/5ML PO SOLN: 5.0000 mL | Freq: Four times a day (QID) | ORAL | Status: DC | PRN

## 2022-09-08 MED ORDER — BENZONATATE 100 MG PO CAPS
100.0000 mg | ORAL_CAPSULE | Freq: Three times a day (TID) | ORAL | Status: DC
  Administered 2022-09-08 – 2022-09-09 (×3): 100 mg via ORAL
  Filled 2022-09-08 (×3): qty 1

## 2022-09-08 MED ORDER — ORAL CARE MOUTH RINSE: 15.0000 mL | OROMUCOSAL | Status: DC | PRN

## 2022-09-08 MED ORDER — POTASSIUM CHLORIDE CRYS ER 20 MEQ PO TBCR
40.0000 meq | EXTENDED_RELEASE_TABLET | ORAL | Status: AC
  Administered 2022-09-08 (×2): 40 meq via ORAL
  Filled 2022-09-08 (×2): qty 2

## 2022-09-08 NOTE — Consult Note (Addendum)
Cardiology Consultation   Patient ID: Spencer Mendoza MRN: [id number]; DOB: 11/11/1961  Admit date: 09/07/2022 Date of Consult: 09/08/2022  PCP:  Jeanie Sewer, NP   [facility] Cardiologist:  Candee Furbish, MD        Patient Profile:   Spencer Mendoza is a 61 y.o. male with a hx of nonobstructive CAD by coronary CT 03/2020, vasovagal syncope, PVD (stenosis of IMA, bilateral iliac arteries, and <50% distal aorta seen by [facility] in 2021), carotid artery disease (93-57% RICA and 0-17% LICA in 7939), sinus bradycardia/PSVT/PAT/junctional tach by monitor 2021, alcohol abuse, cocaine abuse, epilepsy, migraines, COPD, emphysema by prior CT who is being seen 09/08/2022 for the evaluation of syncope at the request of Dr. Posey Pronto.  History of Present Illness:   Spencer Mendoza was previously evaluated by Dr. Marlou Porch in 2021 for unusual episodes of recurrent LOC. Cardiac testing did not reveal a cause at that time. 2D echo showed EF 50-55%, no RWMA, mild MR. Cor CT showed CAC 330 (94%ile) with moderate plaque in LAD and mild in the RCA, FFR negative. Zio showed NSR, rare junctional tach, occasional SVT/PAT but no pauses or afib. Lowest HR was 40bpm, average 56bpm c/w known sinus bradycardia. He does report he passed out several times while wearing the monitor. Syncope was felt vasovagal/noncardiac in etiology. At last OV 04/2022 he was reporting worsening thigh pain/weakness so LE ABIs were ordered and referred to Dr. Fletcher Anon but patient no-showed.    He returned to the hospital with multiple recurrent episodes of LOC, 4 out of the last 5 nights prior to admission. All in all he's had about 10 episodes in the last 6 months. He reports they are primarily occurring when he goes to stand up to go to the bathroom at night. During the episode on Saturday he reports his sister told him he was out for 30-40 minutes. He urinated on himself with that event. During the episode before that he  also had soiled himself with a BM. He denies any prodrome of symptoms except occasionally will feel like his heart is "not beating quite right, slower-like." He has also lost about 40 lb unintentionally in the last year. He smokes 2-3 cigarettes a day, reports 1 beer every other day, and denies illicit drug use aside from marijuana. Workup included CXR with patchy opacity ?pulm contusion, needing 4-6 week f/u. CT chest showed rib fx, aortic + coronary atherosclerosis, emphysematous changes, no PE. CT head nonacute. Labs notable for neg trop x 2, BNP 66, Hgb 14.4->12.1, K 3.9->3.0, Mg 1.8, albumin 2.9, ETOH neg, TSH wnl. Echo pending. Initial orthostatic VS negative for drop in BP (it rose) but HR did increase from 56->76. Brain MRI nonacute. Telemetry unremarkable so far, mild sinus bradycardia upper 40s-50s. This was seen similarly on event monitor in 2021.   Past Medical History:  Diagnosis Date   Abdominal pain 08/30/2019   Alcohol withdrawal ([place]) 07/29/2013   Allergy    Anxiety    Arthritis    Benzodiazepine abuse ([place]) 08/30/2019   Bipolar disorder ([place])    CAD (coronary artery disease)    Chronic insomnia 06/06/2015   Chronic migraine without aura, with intractable migraine, so stated, with status migrainosus 08/25/2019   Claustrophobia    Cocaine abuse ([place]) 05/22/2015   COPD (chronic obstructive pulmonary disease) ([place])    Delirium tremens ([place]) 07/29/2013   Epilepsy ([place])    last seizure week of 06-25-2020- sev. grand mal seizures per pt  Hemiplegic migraine with status migrainosus 06/06/2015   Hemiplegic migraine with status migrainosus 06/06/2015   Migraine    Opioid use disorder 08/30/2019   PONV (postoperative nausea and vomiting)    Stroke [facility])    patient denies- states had a hemiplegic migraine in ~2011   Substance abuse ([place])     Past Surgical History:  Procedure Laterality Date   ANKLE SURGERY     in high school   APPENDECTOMY     COLONOSCOPY     KNEE  ARTHROSCOPY Right    NOSE SURGERY     REVERSE SHOULDER ARTHROPLASTY Right 03/27/2022   Procedure: REVERSE SHOULDER ARTHROPLASTY;  Surgeon: Hiram Gash, MD;  Location: [place];  Service: Orthopedics;  Laterality: Right;   SHOULDER ARTHROSCOPY WITH DISTAL CLAVICLE RESECTION Right 12/04/2021   Procedure: SHOULDER ARTHROSCOPY WITH DISTAL CLAVICLE EXCISION;  Surgeon: Hiram Gash, MD;  Location: [place];  Service: Orthopedics;  Laterality: Right;   SHOULDER ARTHROSCOPY WITH SUBACROMIAL DECOMPRESSION, ROTATOR CUFF REPAIR AND BICEP TENDON REPAIR Right 12/04/2021   Procedure: SHOULDER ARTHROSCOPY WITH SUBACROMIAL DECOMPRESSION, ROTATOR CUFF REPAIR AND BICEP TENDON REPAIR;  Surgeon: Hiram Gash, MD;  Location: [place];  Service: Orthopedics;  Laterality: Right;     Home Medications:  Prior to Admission medications   Medication Sig Start Date End Date Taking? Authorizing Provider  acetaminophen (TYLENOL) 500 MG tablet Take 1,000 mg by mouth 2 (two) times daily as needed (pain).   Yes [provider]  albuterol (VENTOLIN HFA) 108 (90 Base) MCG/ACT inhaler INHALE 2 PUFFS BY MOUTH INTO THE LUNGS EVERY 4 HOURS AS NEEDED FOR SHORTNESS OF BREATH OR WHEEZING Patient taking differently: Inhale 2 puffs into the lungs as needed for wheezing or shortness of breath. 03/28/22  Yes Jeanie Sewer, NP  budesonide-formoterol (SYMBICORT) 160-4.5 MCG/ACT inhaler Inhale 2 puffs into the lungs 2 (two) times daily. 03/25/22  Yes Jeanie Sewer, NP  levETIRAcetam (KEPPRA) 1000 MG tablet Take 1 tablet (1,000 mg total) by mouth 2 (two) times daily. 06/26/22  Yes Domenic Moras, PA-C  traZODone (DESYREL) 100 MG tablet TAKE 1 TO 2 TABLETS(100 TO 200 MG) BY MOUTH AT BEDTIME 06/12/22  Yes Hudnell, Stephanie, NP  acidophilus (RISAQUAD) CAPS capsule Take 1 capsule by mouth daily.    [provider]  celecoxib (CELEBREX) 200 MG capsule Take 1 capsule (200 mg  total) by mouth 2 (two) times daily. 07/02/22   Margarita Mail, PA-C  meclizine (ANTIVERT) 25 MG tablet Take 1 tablet (25 mg total) by mouth every 8 (eight) hours as needed for nausea. Patient not taking: Reported on 08/01/2022 03/27/22   Ethelda Chick, PA-C  rosuvastatin (CRESTOR) 10 MG tablet Take 1 tablet (10 mg total) by mouth daily. Patient not taking: Reported on 09/07/2022 02/28/22 02/23/23  Loel Dubonnet, NP  traMADol (ULTRAM) 50 MG tablet Take 50 mg by mouth every 6 (six) hours as needed. Patient not taking: Reported on 09/07/2022 07/09/22   [provider]    Inpatient Medications: Scheduled Meds:  heparin  5,000 Units Subcutaneous Q8H   levETIRAcetam  1,000 mg Oral BID   mometasone-formoterol  2 puff Inhalation BID   nicotine  21 mg Transdermal Daily   potassium chloride  40 mEq Oral Q2H   rosuvastatin  10 mg Oral Daily   sodium chloride flush  3 mL Intravenous Q12H   Continuous Infusions:  sodium chloride 75 mL/hr at 09/08/22 0022   PRN Meds: acetaminophen **OR** acetaminophen,  albuterol, morphine injection, mouth rinse, oxyCODONE, prochlorperazine, traZODone  Allergies:    Allergies  Allergen Reactions   Sertraline Other (See Comments)    Erectile dysfunction   Codeine Nausea And Vomiting   Depakote [Divalproex Sodium] Other (See Comments)    Made patient shake   Migranal [Dihydroergotamine] Nausea And Vomiting   Topamax [Topiramate] Other (See Comments)    Made patient angry    Social History:   Social History   Socioeconomic History   Marital status: Legally Separated    Spouse name: Not on file   Number of children: 0   Years of education: GED   Highest education level: Not on file  Occupational History   Occupation: disabled  Tobacco Use   Smoking status: Every Day    Packs/day: 0.25    Years: 15.00    Total pack years: 3.75    Types: Cigarettes   Smokeless tobacco: Never   Tobacco comments:    States he quit 02-25-22  Vaping Use    Vaping Use: Never used  Substance and Sexual Activity   Alcohol use: Yes    Alcohol/week: 6.0 standard drinks of alcohol    Types: 6 Cans of beer per week    Comment: one a night   Drug use: Yes    Types: Marijuana    Comment: daily   Sexual activity: Yes    Birth control/protection: Condom  Other Topics Concern   Not on file  Social History Narrative   Pt lives with sister.   Patient drinks 2 cups of caffeine daily.   Patient is right handed.   Social Determinants of Health   Financial Resource Strain: Low Risk  (08/01/2022)   Overall Financial Resource Strain (CARDIA)    Difficulty of Paying Living Expenses: Not hard at all  Food Insecurity: Food Insecurity Present (09/08/2022)   Hunger Vital Sign    Worried About Running Out of Food in the Last Year: Sometimes true    Ran Out of Food in the Last Year: Sometimes true  Transportation Needs: No Transportation Needs (09/08/2022)   PRAPARE - Hydrologist (Medical): No    Lack of Transportation (Non-Medical): No  Physical Activity: Inactive (08/01/2022)   Exercise Vital Sign    Days of Exercise per Week: 0 days    Minutes of Exercise per Session: 0 min  Stress: Stress Concern Present (08/01/2022)   [place]    Feeling of Stress : Rather much  Social Connections: Unknown (08/01/2022)   Social Connection and Isolation Panel [NHANES]    Frequency of Communication with Friends and Family: More than three times a week    Frequency of Social Gatherings with Friends and Family: More than three times a week    Attends Religious Services: More than 4 times per year    Active Member of [employer] or Organizations: Not on file    Attends Archivist Meetings: Not on file    Marital Status: Separated  Intimate Partner Violence: Not At Risk (09/08/2022)   Humiliation, Afraid, Rape, and Kick questionnaire    Fear of Current or Ex-Partner:  No    Emotionally Abused: No    Physically Abused: No    Sexually Abused: No    Family History:   Family History  Problem Relation Age of Onset   Heart disease Mother    Stroke Mother    Epilepsy Mother    Heart failure Mother  Cancer Father    Heart disease Father    Hypertension Father    Melanoma Father    Migraines Father    Colon cancer Sister 5   Colon cancer Cousin    Cancer Cousin    Colon polyps Neg Hx    Esophageal cancer Neg Hx    Rectal cancer Neg Hx    Stomach cancer Neg Hx      ROS:  Please see the history of present illness.  All other ROS reviewed and negative.     Physical Exam/Data:   Vitals:   09/08/22 0038 09/08/22 0430 09/08/22 0500 09/08/22 0820  BP: (!) 142/80 122/84    Pulse: (!) 52 (!) 56    Resp: 20 18    Temp: 97.7 F (36.5 C) 98 F (36.7 C)    TempSrc: Oral Oral    SpO2: 100% 97%  93%  Weight:   70.8 kg   Height:        Intake/Output Summary (Last 24 hours) at 09/08/2022 0831 Last data filed at 09/08/2022 0139 Gross per 24 hour  Intake --  Output 200 ml  Net -200 ml      09/08/2022    5:00 AM 09/07/2022   12:27 PM 07/02/2022   11:29 AM  Last 3 Weights  Weight (lbs) 156 lb 1.4 oz 150 lb 145 lb  Weight (kg) 70.8 kg 68.04 kg 65.772 kg     Body mass index is 21.17 kg/m.  General: Well developed, well nourished, in no acute distress Head: Normocephalic, atraumatic, sclera non-icteric, no xanthomas, nares are without discharge. Neck: Negative for carotid bruits. JVP not elevated. Lungs: Clear bilaterally to auscultation without wheezes, rales, or rhonchi. Breathing is unlabored. Heart: RRR S1 S2 without murmurs, rubs, or gallops.  Abdomen: Soft, non-tender, non-distended with normoactive bowel sounds. No rebound/guarding. Extremities: No clubbing or cyanosis. No edema. Distal pedal pulses are 2+ and equal bilaterally. Neuro: Alert and oriented X 3. Moves all extremities spontaneously. Psych:  Responds to questions  appropriately with a normal affect.   EKG:  The EKG was personally reviewed and demonstrates:  NSR 61bpm right axis deviation possible prior septal infarct pattern. No acute STT changes.  Telemetry:  Telemetry was personally reviewed and demonstrates:  described above, SB, no arrhythmias or pauses  Relevant CV Studies:   Monitor 2021 Sinus rhythm Rare junctional rhythm tachycardia Occasoinal SVT, Paroxysmal atrial tachycardia. No AFIB No pauses   No adverse arrhythmias Candee Furbish, MD  Cor CT 03/2020 IMPRESSION: 1. Coronary calcium score of 329. This was 37 percentile for age and sex matched control.   2. Normal coronary origin with right dominance.   3. Three vessel coronary mixed calcified non calcified plaque with moderate severity in proximal first OM Favian Kittleson and mid LAD. Sending for FFR analysis.   Candee Furbish, MD [facility]   Normal FFR. No evidence of flow limiting CAD. Continue with medical management.   No acute extra cardiac abnormality.   Peripheral interstitial thickening which likely reflects early fibrosis.   2D echo 03/2020    1. Left ventricular ejection fraction, by estimation, is 50 to 55%. The  left ventricle has low normal function. The left ventricle has no regional  wall motion abnormalities. Left ventricular diastolic parameters were  normal. The average left ventricular   global longitudinal strain is -22.1 %.   2. Right ventricular systolic function is normal. The right ventricular  size is normal.   3. The mitral valve is normal in structure.  Mild mitral valve  regurgitation. No evidence of mitral stenosis.   4. The aortic valve is normal in structure. Aortic valve regurgitation is  not visualized. No aortic stenosis is present.   Comparison(s): 12/05/13 EF 60-65%.     Laboratory Data:  High Sensitivity Troponin:   Recent Labs  Lab 09/07/22 1247 09/07/22 1426  TROPONINIHS 2 <2     Chemistry Recent Labs  Lab 09/07/22 1247  09/08/22 0420  NA 138 138  K 3.9 3.0*  CL 101 101  CO2 29 28  GLUCOSE 110* 85  BUN 6* 6*  CREATININE 0.91 0.73  CALCIUM 9.2 8.4*  MG  --  1.8  GFRNONAA >60 >60  ANIONGAP 8 9    Recent Labs  Lab 09/08/22 0420  PROT 5.2*  ALBUMIN 2.9*  AST 14*  ALT 11  ALKPHOS 84  BILITOT 0.5   Lipids No results for input(s): "CHOL", "TRIG", "HDL", "LABVLDL", "LDLCALC", "CHOLHDL" in the last 168 hours.  Hematology Recent Labs  Lab 09/07/22 1247 09/08/22 0420  WBC 7.4 5.0  RBC 4.47 3.80*  HGB 14.4 12.1*  HCT 40.7 35.1*  MCV 91.1 92.4  MCH 32.2 31.8  MCHC 35.4 34.5  RDW 13.2 13.2  PLT 265 185   Thyroid  Recent Labs  Lab 09/08/22 0420  TSH 3.006    BNP Recent Labs  Lab 09/07/22 1247  BNP 66.0    DDimer  Recent Labs  Lab 09/07/22 1530  DDIMER 0.55*     Radiology/Studies:  CT Angio Chest PE W and/or Wo Contrast  Result Date: 09/07/2022 CLINICAL DATA:  Fall. Syncopal episode. Patient fell into a door trauma to his chest. EXAM: CT ANGIOGRAPHY CHEST WITH CONTRAST TECHNIQUE: Multidetector CT imaging of the chest was performed using the standard protocol during bolus administration of intravenous contrast. Multiplanar CT image reconstructions and MIPs were obtained to evaluate the vascular anatomy. RADIATION DOSE REDUCTION: This exam was performed according to the departmental dose-optimization program which includes automated exposure control, adjustment of the mA and/or kV according to patient size and/or use of iterative reconstruction technique. CONTRAST:  55m OMNIPAQUE IOHEXOL 350 MG/ML SOLN COMPARISON:  Chest without contrast 09/07/2022 FINDINGS: Cardiovascular: Heart size is normal. Coronary artery calcifications are present. Atherosclerotic calcifications are present in the aorta origin of the left subclavian artery. No significant stenosis or aneurysm is present. Pulmonary artery opacification is excellent. No focal filling defects are present to suggest pulmonary  embolus. Pulmonary artery size is normal. Mediastinum/Nodes: No enlarged mediastinal, hilar, or axillary lymph nodes. Thyroid gland, trachea, and esophagus demonstrate no significant findings. Lungs/Pleura: Centrilobular and paraseptal emphysematous changes are again noted bilaterally. No nodule or mass lesion is present. No focal airspace consolidation is present. No contusion is present. Airways are patent. No significant pleural disease is present. Effusion or pneumothorax. Upper Abdomen: Limited imaging the abdomen is unremarkable. There is no significant adenopathy. No solid organ lesions are present. Musculoskeletal: Anterior right third rib fracture is displaced at least a half a shaft width. Nondisplaced anterior fourth and fifth rib fractures are noted. More remote right fourth, fifth and sixth fractures are again seen laterally. Additional fractures are present. Vertebral body heights are normal. No focal osseous lesions are present. Review of the MIP images confirms the above findings. IMPRESSION: 1. No pulmonary embolus. Normal opacification of pulmonary arteries. 2. Anterior right third rib fracture is displaced at least a half a shaft width. 3. Nondisplaced anterior fourth and fifth rib fractures are likely also acute. 4. Coronary artery  disease. 5. More remote right fourth, fifth and sixth fractures are again seen laterally. 6. Aortic Atherosclerosis (ICD10-I70.0) and Emphysema (ICD10-J43.9). Electronically Signed   By: San Morelle M.D.   On: 09/07/2022 16:42   DG Shoulder Right  Result Date: 09/07/2022 CLINICAL DATA:  Trauma, fall EXAM: RIGHT SHOULDER - 2+ VIEW COMPARISON:  06/26/2022 FINDINGS: There is previous reverse arthroplasty in right shoulder. No recent fracture or dislocation is seen. No significant interval changes are noted. IMPRESSION: Previous right shoulder arthroplasty. No recent fracture or dislocation is seen. Electronically Signed   By: Elmer Picker M.D.   On:  09/07/2022 15:22   CT Chest Wo Contrast  Result Date: 09/07/2022 CLINICAL DATA:  Chest trauma. EXAM: CT CHEST WITHOUT CONTRAST TECHNIQUE: Multidetector CT imaging of the chest was performed following the standard protocol without IV contrast. RADIATION DOSE REDUCTION: This exam was performed according to the departmental dose-optimization program which includes automated exposure control, adjustment of the mA and/or kV according to patient size and/or use of iterative reconstruction technique. COMPARISON:  Chest CT dated 06/26/2022. FINDINGS: Cardiovascular: No thoracic aortic aneurysm. No pericardial effusion. Atherosclerosis of the thoracic aorta and coronary arteries. Mediastinum/Nodes: No mass or enlarged lymph nodes are seen within the mediastinum or perihilar regions. Esophagus appears normal. Trachea is unremarkable. Lungs/Pleura: Centrilobular and paraseptal emphysematous changes bilaterally, at least moderate in degree and upper lobe predominant. No suspicious nodule or mass. No evidence of pneumonia or pulmonary edema. No pleural effusion or pneumothorax. Upper Abdomen: No acute abnormality. Musculoskeletal: Acute slightly displaced fracture of the anterior RIGHT third rib. Multiple subacute and chronic rib fractures involving the RIGHT fourth fifth and sixth ribs. IMPRESSION: 1. Acute slightly displaced fracture of the anterior RIGHT third rib. 2. Multiple subacute and chronic rib fractures involving the RIGHT fourth through sixth ribs. 3. No pleural effusion or pneumothorax. 4. Aortic and coronary artery atherosclerosis. Emphysema (ICD10-J43.9). Electronically Signed   By: Franki Cabot M.D.   On: 09/07/2022 15:18   CT Head Wo Contrast  Result Date: 09/07/2022 CLINICAL DATA:  Head trauma, moderate to severe. EXAM: CT HEAD WITHOUT CONTRAST TECHNIQUE: Contiguous axial images were obtained from the base of the skull through the vertex without intravenous contrast. RADIATION DOSE REDUCTION: This  exam was performed according to the departmental dose-optimization program which includes automated exposure control, adjustment of the mA and/or kV according to patient size and/or use of iterative reconstruction technique. COMPARISON:  None Available. FINDINGS: Brain: No hydrocephalus. No mass, hemorrhage, edema or other evidence of acute parenchymal abnormality. No extra-axial hemorrhage. Vascular: Chronic calcified atherosclerotic changes of the large vessels at the skull base. No unexpected hyperdense vessel. Skull: Normal. Negative for fracture or focal lesion. Sinuses/Orbits: No acute findings. Chronic-appearing mucosal thickening within the ethmoid air cells and sphenoid sinus. Other: None. IMPRESSION: 1. No acute findings. No intracranial mass, hemorrhage or edema. No skull fracture. 2. Chronic-appearing paranasal sinus disease. Electronically Signed   By: Franki Cabot M.D.   On: 09/07/2022 15:11   DG Chest 2 View  Result Date: 09/07/2022 CLINICAL DATA:  Fall and chest pain EXAM: CHEST - 2 VIEW COMPARISON:  CT chest dated 06/26/2022, chest radiograph dated 05/23/2021, right shoulder radiographs dated 06/26/2022 FINDINGS: The patient is rotated to the right. Normal lung volumes. Patchy opacity in the lateral right upper. Diffuse bilateral reticular opacities. No pleural effusion or pneumothorax. The heart size and mediastinal contours are within normal limits. Reverse total right shoulder replacement. IMPRESSION: Patchy opacity in the lateral right upper lobe may  reflect a focus of pulmonary contusion in the setting of trauma. Recommend follow-up chest x-ray in 4-6 weeks to ensure resolution. Electronically Signed   By: Darrin Nipper M.D.   On: 09/07/2022 13:13     Assessment and Plan:   1. Recurrent loss of consciousness complicated by rib fractures, h/o vasovagal syncope with orthostatic component - orthostatic VS neg for drop in BP but did demonstrate rise in HR c/w orthostasis -  brain imaging  unrevealing - episodes are somewhat atypical in that they have resulted in some bowel and bladder incontinence, and the length of last LOC was ? 30-40 minutes per patient's report from sister - UDS for completeness - await echo - consider repeat event monitor at discharge - discussed DMV recommendation of no driving x 6 months  2. Mild-moderate CAD by CTA 2021, negative troponins - continue rosuvastatin, consider titration of intensity given PAD/CAD - lipid panel in AM - no recent anginal type chest pain  3. Epilepsy - will defer mgmt to primary team  4. PSVT, PAT, junctional tach by monitor 2021 - quiescent  5. Prior PAD with stenosis of IMA, bilateral iliac arteries, and <50% distal aorta seen by [facility] in 2021, intended for OP f/u but did not attend, also with 84-13% RICA and 2-44% LICA stenosis in 0102 - will d/w MD in context of #1 - recommend to revisit follow-up plans at post-hospital visit - order updated carotid duplex  6. Hypokalemia - primary team to manage electrolytes  7. Unintentional weight loss - recommended pt see PCP in follow-up to discuss - TSH Ok  Risk Assessment/Risk Scores:     N/A  For questions or updates, please contact Person Please consult [email or web address] for contact info under    Signed, Charlie Pitter, PA-C  09/08/2022 8:31 AM  Attending note Patient seen and discussed with PA Dunn, I agree with her documentation. 61 yo male with prior history of syncope, epilepsy, mild to mod CAD, PAD admitted with syncope. From 07/17/2020 not thought to have had vasovagal syncope in the past. Monitor was benign as far as a cause of syncope  Presents with syncope. On my history he reports several recent episodes of syncope. Typically occur with getting out of bed at night to walk to bathroom. Episode prior to admission woke up around midnight. Got up out of bed and started walking to bathrrom. En route felt very lightheaded and dizzy, next thing he  knew his sister was trying to get him off the floor. REports chronic orthostatic symptoms, dizziness with standing, sits back down and resolves. Report drinking 8-12 bottles of water daily, does report chronic diarrhea at least 3 lose stools daily.    K 3.9 Cr 0.91 BUN 6 WBC 7.4 Hgb 14.4 Plt 265 BNP 66 Ddimer 0.55 UDS pending Trop 2--> <2 EKG NSR CXR patchy opacity lateral RUL CT chest: rib fractures varying degree of chronicity CT head: no acute process CT PE: no PE MRI brain: no acute process Carotid US pending Echo pending   History would suggest probable orthostatic syncope. He endorses adequate hydration at home, does report chronic diarrhea fluid losses. Orthostatics neg by bp but HR up 26 beats with standing. Trazodone 4% association with hypotension and 3-5% syncope. Some sinus brady 40s to 50s here but episodes not consistent with bradyarrhthmia as etiology though outpatient monitor would be reasonable. Repeat orthostatics, given the frequency of episodes and multiple rib fractures varying chronicity would go ahead and start trial of low  dose midodrine to see if helps with episodes. Continue aggressive hydration and increased sodium intake at home. Would f/u echo, follow telemetry today, repeat orthostatics, start low dose midodrine today and likely d/c tomorrow. 14 day zio patch at discharge.    Carlyle Dolly MD

## 2022-09-08 NOTE — TOC Initial Note (Signed)
Transition of Care [facility]) - Initial/Assessment Note    Patient Details  Name: Spencer Mendoza MRN: [id number] Date of Birth: 1961-07-17  Transition of Care [facility]) CM/SW Contact:    Iona Beard, [place] Phone Number: 09/08/2022, 12:41 PM  Clinical Narrative:                 TOC updated that PT is recommending SNF for pt at D/C. CSW spoke with pt in room about recommendation. Pt is agreeable to SNF referral and would like it to be sent out to facilities in [place]. CSW sent referral out to facilities at pts request. TOC to follow for bed offers.   Expected Discharge Plan: Skilled Nursing Facility Barriers to Discharge: Continued Medical Work up   Patient Goals and CMS Choice Patient states their goals for this hospitalization and ongoing recovery are:: go to SNF CMS Medicare.gov Compare Post Acute Care list provided to:: Patient Choice offered to / list presented to : Patient  Expected Discharge Plan and Services Expected Discharge Plan: [place] In-house Referral: Clinical Social Work Discharge Planning Services: CM Consult Post Acute Care Choice: [place] Living arrangements for the past 2 months: [place]                                      Prior Living Arrangements/Services Living arrangements for the past 2 months: Single Family Home Lives with:: Self Patient language and need for interpreter reviewed:: Yes Do you feel safe going back to the place where you live?: Yes      Need for Family Participation in Patient Care: Yes (Comment) Care giver support system in place?: Yes (comment)   Criminal Activity/Legal Involvement Pertinent to Current Situation/Hospitalization: No - Comment as needed  Activities of Daily Living Home Assistive Devices/Equipment: None ADL Screening (condition at time of admission) Patient's cognitive ability adequate to safely complete daily activities?: Yes Is the patient deaf or have  difficulty hearing?: No Does the patient have difficulty seeing, even when wearing glasses/contacts?: No Does the patient have difficulty concentrating, remembering, or making decisions?: No Patient able to express need for assistance with ADLs?: Yes Does the patient have difficulty dressing or bathing?: Yes Independently performs ADLs?: No Communication: Independent Dressing (OT): Needs assistance Is this a change from baseline?: Change from baseline, expected to last >3 days Grooming: Needs assistance Is this a change from baseline?: Change from baseline, expected to last >3 days Feeding: Independent Bathing: Needs assistance Is this a change from baseline?: Change from baseline, expected to last >3 days Toileting: Needs assistance Is this a change from baseline?: Change from baseline, expected to last >3days In/Out Bed: Needs assistance Is this a change from baseline?: Change from baseline, expected to last >3 days Walks in Home: Independent Does the patient have difficulty walking or climbing stairs?: Yes Weakness of Legs: Both Weakness of Arms/Hands: None  Permission Sought/Granted                  Emotional Assessment Appearance:: Appears stated age Attitude/Demeanor/Rapport: Engaged Affect (typically observed): Accepting Orientation: : Oriented to Self, Oriented to Place, Oriented to  Time, Oriented to Situation Alcohol / Substance Use: Not Applicable Psych Involvement: No (comment)  Admission diagnosis:  Syncope and collapse [R55] Syncope [R55] Closed fracture of multiple ribs of right side, initial encounter [S22.41XA] Patient Active Problem List   Diagnosis Date Noted   Syncope  09/07/2022   Fall at home, initial encounter 09/07/2022   Sinus bradycardia 09/07/2022   Facial droop 09/07/2022   Rib fractures 09/07/2022   Alcohol abuse with withdrawal, uncomplicated ([place]) 75/44/9201   Coronary artery disease of native artery of native heart with stable angina  pectoris ([place]) 02/03/2022   Prolonged Q-T interval on ECG    Intentional overdose ([place]) 08/26/2021   Exanthem due to herpes zoster 04/01/2021   Abdominal aortic atherosclerosis ([place]) with 50% occlusion distal aorta 03/05/2020   Osteoarthritis of left wrist 01/04/2020   Liver hemangioma 09/21/2019   Tobacco use 08/30/2019   COPD with chronic bronchitis 08/30/2019   Chronic migraine without aura, with intractable migraine, so stated, with status migrainosus 08/25/2019   MDD (major depressive disorder), recurrent severe, without psychosis ([place]) 11/18/2018   Chronic insomnia 06/06/2015   Seizures ([place]) 01/09/2014   Chronic low back pain 12/28/2013   History of stroke 12/04/2013   Hypertension 12/04/2013   PCP:  Jeanie Sewer, NP Pharmacy:   [facility] [address] [place] [address] Phone: [phone] Fax: [phone]  [id number] - [place], [place] - [address] [place] [address] Phone: [phone] Fax: [phone]     Social Determinants of Health (SDOH) Interventions    Readmission Risk Interventions     No data to display

## 2022-09-08 NOTE — Progress Notes (Incomplete)
*  PRELIMINARY RESULTS* Echocardiogram 2D Echocardiogram has been performed.  Elpidio Anis 09/08/2022, 11:35 AM

## 2022-09-08 NOTE — Progress Notes (Signed)
Progress Note Patient: Spencer Mendoza QZE:[id number] DOB: 05-23-1961 DOA: 09/07/2022  DOS: the patient was seen and examined on 09/08/2022  Brief hospital course: PMH of CAD, CVA, COPD, smoker, seizure, substance abuse present to the hospital with complaints of passing out event. Found to have multiple rib fractures. Denies any recent change in medication but does note that he lost 40 pounds of weight in the last few months. Does not drink alcohol 2 beers every other day as well as uses marijuana.  Assessment and Plan: Recurrent syncope. Orthostatic vitals are negative. Patient is bradycardic but does not have any significant cardiac arrhythmia. Monitor on telemetry. Outpatient cardiology consultation. Started on midodrine. CT PE negative. Echocardiogram pending. Possibility of side effect of Keppra cannot be ruled out.    Multiple rib fractures. Patient appears to have acute to subacute rib fractures based on CT scan. Trauma surgery was consulted and recommended the patient can be at any point hospital. Continue incentive spirometer hypertension continue pain control. Continue cough control.  History of seizures. On Keppra 1000 mg twice daily. We will discuss with neurology to see if patient can be scheduled for a lower dose of Keppra 750 mg twice daily.  Right facial droop. MRI brain negative for acute stroke. Monitor.  Sinus bradycardia. Not on any rate controlling medication. Etiology not clear. [place] cardiology consultation.  Active smoker. Nicotine patch.  Alcohol use. Monitor for withdrawal prevention May require CIWA protocol.  Hypokalemia. Currently being corrected.  Polysubstance abuse. UDS positive for opioids which was given to the patient in the hospital. Also positive for cannabis. Counseled to quit.  Unintentional weight loss. Etiology not clear potentially could be related to his diarrhea. Monitor for now.  Chronic diarrhea prevention  patient reports that he has chronic diarrhea with 2-3 loose watery bowel movements ongoing for last 1 month. Patient also reports 40 pound weight loss over last 6 months. Recommend outpatient follow-up with GI if does not have diarrhea here in the hospital. Electrolytes shows mild potassium loss.  Will monitor.  Subjective: No nausea no vomiting no fever no chills.  No BM since Friday.  Passing gas.  Has severe cough related chest pain.  Reports shortness of breath.  Physical Exam: Vitals:   09/08/22 0038 09/08/22 0430 09/08/22 0500 09/08/22 0820  BP: (!) 142/80 122/84    Pulse: (!) 52 (!) 56    Resp: 20 18    Temp: 97.7 F (36.5 C) 98 F (36.7 C)    TempSrc: Oral Oral    SpO2: 100% 97%  93%  Weight:   70.8 kg   Height:       General: Appear in moderate distress; no visible Abnormal Neck Mass Or lumps, Conjunctiva normal Cardiovascular: S1 and S2 Present, no Murmur, Respiratory: increased respiratory effort, Bilateral Air entry present and bilateral Crackles, no wheezes Abdomen: Bowel Sound present, Non tender Extremities: no Pedal edema Neurology: alert and oriented to time, place, and person  Gait not checked due to patient safety concerns   Data Reviewed: I have Reviewed nursing notes, Vitals, and Lab results since pt's last encounter. Pertinent lab results CBC and BMP I have ordered test including CBC and BMP    Family Communication: No one at bedside  Disposition: Status is: Inpatient Remains inpatient appropriate because: Needs further work-up and therapy for his refracture and syncopal events.  heparin injection 5,000 Units Start: 09/07/22 2200 SCDs Start: 09/07/22 2025   Level of care: Telemetry Telemetry reviewed, shows frequent PVCs and Continue Telemetry  due to syncope   Author: Berle Mull, MD 09/08/2022 5:27 PM Please look on [email or web address] to find out who is on call.

## 2022-09-08 NOTE — Progress Notes (Signed)
RE: Spencer Mendoza Date Of Birth: May 19, 1961 Date: 09/08/2022  MUST ID: [id number]  To Whom It May Concern:   Please be advised that the above name patient will require a short-term nursing home stay - anticipated 30 days or less rehabilitation and strengthening. The plan is for return home.

## 2022-09-08 NOTE — Evaluation (Signed)
Physical Therapy Evaluation Patient Details Name: Spencer Mendoza MRN: [id number] DOB: June 10, 1961 Today's Date: 09/08/2022  History of Present Illness  Spencer Mendoza is a 61 y.o. male with medical history significant of polysubstance abuse, CAD, epilepsy, stroke, COPD, tobacco use disorder, and more presents the ED with a chief complaint of blacking out.  Of note, patient initially answers yes to every review of system you ask, when you get more detailed you find out that he is answering for things that happened in the remote history, and not always answering things that happened recently.  Patient reports that he has been blacking out for couple of weeks.  It happened 4 out of the last 5 nights.  He gets up slow, because he knows that he is at risk of passing out, and he wakes up on the floor.  Not sure how long he is out.  He has hit his head several times.  He reports blurry vision since this started.  He reports dysarthria at times.  He is also had some right facial droop and paresthesias at times.  He does not have those things now.  Patient reports he has had chest pain on the right side of his chest where his ribs are broken.  His chest pain is worse with positional changes.  It is worse with a deep breath.  He reports that since having his last fall he does find it difficult to breathe.  It is worse when laying flat.  He has noticed himself wheezing and reports he has been having to use his inhaler every day.  He uses his Symbicort in the a.m. and his rescue inhaler in the pm.  Patient reports that he has had a cough that is productive of green/white sputum.  He felt feverish last week but that went away spontaneously.  He has had sick contacts with kids that are in his home having strep throat.  Patient does have a history of seizures and has a recognizable aura.  He reports he has not had an aura recently and nobody has reported to him any convulsive activity while he is out.  Patient has no  other complaints at this time.   Clinical Impression  Patient with slightly labored movement for sitting up at bedside with c/o rib pain with bed mobility. Patient taken off 2 LPM O2 due to SpO2 maintaining 100% on room air. Patient able to stand without AD but very shaky and unsteady on feet requiring min assist, patient also with c/o of lightheadedness and dizziness upon standing with BP at 132/90 mmHg. Patient given SPC with good carryover, able to ambulate to room door with mod assist before having to sit due to continued c/o lightheadedness and fatigue. Patient able to ambulate back to bed after seated rest break, kept on room air due to 100% SpO2 during activity and at rest. Patient will benefit from continued skilled physical therapy in hospital and recommended venue below to increase strength, balance, endurance for safe ADLs and gait.     Recommendations for follow up therapy are one component of a multi-disciplinary discharge planning process, led by the attending physician.  Recommendations may be updated based on patient status, additional functional criteria and insurance authorization.  Follow Up Recommendations Skilled nursing-short term rehab (<3 hours/day) Can patient physically be transported by private vehicle: Yes    Assistance Recommended at Discharge Set up Supervision/Assistance  Patient can return home with the following  A lot of help with walking and/or  transfers;A lot of help with bathing/dressing/bathroom;Assistance with cooking/housework;Help with stairs or ramp for entrance    Equipment Recommendations Cane  Recommendations for Other Services       Functional Status Assessment Patient has had a recent decline in their functional status and demonstrates the ability to make significant improvements in function in a reasonable and predictable amount of time.     Precautions / Restrictions Precautions Precautions: Fall Restrictions Weight Bearing Restrictions: No       Mobility  Bed Mobility Overal bed mobility: Modified Independent             General bed mobility comments: increased time, slightly labored movement, c/o rib pain in supine    Transfers Overall transfer level: Needs assistance Equipment used: None Transfers: Sit to/from Stand Sit to Stand: Min assist           General transfer comment: patient able to sit to stand w/o AD, requires min assist when standing due to shakiness and unsteadiness on feet w/ c/o lightheadness and dizziness    Ambulation/Gait Ambulation/Gait assistance: Mod assist Gait Distance (Feet): 20 Feet Assistive device: Straight cane Gait Pattern/deviations: Decreased step length - left, Decreased step length - right, Decreased stride length Gait velocity: decreased     General Gait Details: patient very shaky and unsteady on feet with slow cadence requiring use of SPC/mod assist for room ambulation, pt required one seated rest break at room door due to c/o lightheadness  Stairs            Wheelchair Mobility    Modified Rankin (Stroke Patients Only)       Balance Overall balance assessment: Needs assistance Sitting-balance support: Feet supported, Bilateral upper extremity supported Sitting balance-Leahy Scale: Good Sitting balance - Comments: seated EOB   Standing balance support: Single extremity supported, During functional activity, Reliant on assistive device for balance Standing balance-Leahy Scale: Fair Standing balance comment: poor w/o AD, fair with SPC                             Pertinent Vitals/Pain Pain Assessment Pain Assessment: 0-10 Pain Score: 9  Pain Location: right upper ribs and right shoulder Pain Descriptors / Indicators: Sharp Pain Intervention(s): Limited activity within patient's tolerance, Monitored during session, Repositioned    Home Living Family/patient expects to be discharged to:: Private residence Living Arrangements: Other  relatives (lives with sister) Available Help at Discharge: Family;Available 24 hours/day Type of Home: House Home Access: Ramped entrance       Home Layout: One level Home Equipment: Conservation officer, nature (2 wheels);Grab bars - tub/shower      Prior Function Prior Level of Function : Independent/Modified Independent             Mobility Comments: community ambulator w/o AD, drives (patient reports MD told him he now cannot drive for 6 months) ADLs Comments: Independent, sister has been assisting some with iADLs, pt reports he may need help with toileting ADLs going forward     Hand Dominance   Dominant Hand: Right    Extremity/Trunk Assessment   Upper Extremity Assessment Upper Extremity Assessment: Defer to OT evaluation    Lower Extremity Assessment Lower Extremity Assessment: Generalized weakness    Cervical / Trunk Assessment Cervical / Trunk Assessment: Normal  Communication   Communication: No difficulties  Cognition Arousal/Alertness: Awake/alert Behavior During Therapy: WFL for tasks assessed/performed Overall Cognitive Status: Within Functional Limits for tasks assessed  General Comments      Exercises     Assessment/Plan    PT Assessment Patient needs continued PT services  PT Problem List Decreased strength;Decreased activity tolerance;Decreased balance;Decreased mobility       PT Treatment Interventions DME instruction;Balance training;Gait training;Stair training;Functional mobility training;Patient/family education;Therapeutic activities;Therapeutic exercise    PT Goals (Current goals can be found in the Care Plan section)  Acute Rehab PT Goals Patient Stated Goal: return home after rehab PT Goal Formulation: With patient Time For Goal Achievement: 09/15/22 Potential to Achieve Goals: Good    Frequency Min 3X/week     Co-evaluation PT/OT/SLP Co-Evaluation/Treatment: Yes Reason for  Co-Treatment: To address functional/ADL transfers PT goals addressed during session: Mobility/safety with mobility;Balance;Proper use of DME         AM-PAC PT "6 Clicks" Mobility  Outcome Measure Help needed turning from your back to your side while in a flat bed without using bedrails?: None Help needed moving from lying on your back to sitting on the side of a flat bed without using bedrails?: None Help needed moving to and from a bed to a chair (including a wheelchair)?: A Little Help needed standing up from a chair using your arms (e.g., wheelchair or bedside chair)?: A Little Help needed to walk in hospital room?: A Lot Help needed climbing 3-5 steps with a railing? : A Lot 6 Click Score: 18    End of Session Equipment Utilized During Treatment: Gait belt Activity Tolerance: Patient tolerated treatment well;Patient limited by fatigue Patient left: in bed;with call bell/phone within reach Nurse Communication: Mobility status PT Visit Diagnosis: Unsteadiness on feet (R26.81);Other abnormalities of gait and mobility (R26.89);Muscle weakness (generalized) (M62.81)    Time: 9562-1308 PT Time Calculation (min) (ACUTE ONLY): 32 min   Charges:   PT Evaluation $PT Eval Moderate Complexity: 1 Mod PT Treatments $Therapeutic Activity: 23-37 mins        Zigmund Gottron, SPT

## 2022-09-08 NOTE — Hospital Course (Signed)
PMH of CAD, CVA, COPD, smoker, seizure, substance abuse present to the hospital with complaints of passing out event. Found to have multiple rib fractures. Denies any recent change in medication but does note that he lost 40 pounds of weight in the last few months. Does not drink alcohol 2 beers every other day as well as uses marijuana.

## 2022-09-08 NOTE — Evaluation (Signed)
Occupational Therapy Evaluation Patient Details Name: Spencer Mendoza MRN: [id number] DOB: 01/17/1961 Today's Date: 09/08/2022   History of Present Illness 61 y.o. M admitted on 10/22 due to multiple syncopal episodes. Pt is undergoing a cardiac work up at this time, as his MRI is negative. X-Ray's indicate multiple R sided rib fractures. PMH significant for polysubstance abuse, CAD, epilepsy, stroke, COPD, tobacco use disorder.   Clinical Impression   PT admitted for concerns listed above. PTA Pt reported that he was independent with all ADL's and IADL's, including driving. At this time, pt presents with increased weakness, coordination deficits due to severe global tremors, balance deficits, and increased dizziness with ambulation. Pt able to ambulate to door, then reporting he feels like he is going to pass out and with max assist had to sit immediately. Overall, he is requiring min A, with some mod A for ADL's due to increased shoulder pain and weakness. Recommending SNF level therapies to maximize independence and safety. OT Will follow acutely.       Recommendations for follow up therapy are one component of a multi-disciplinary discharge planning process, led by the attending physician.  Recommendations may be updated based on patient status, additional functional criteria and insurance authorization.   Follow Up Recommendations  Skilled nursing-short term rehab (<3 hours/day)    Assistance Recommended at Discharge Frequent or constant Supervision/Assistance  Patient can return home with the following A lot of help with walking and/or transfers;A lot of help with bathing/dressing/bathroom;Assistance with cooking/housework    Functional Status Assessment  Patient has had a recent decline in their functional status and demonstrates the ability to make significant improvements in function in a reasonable and predictable amount of time.  Equipment Recommendations  Tub/shower  bench;BSC/3in1    Recommendations for Other Services       Precautions / Restrictions Precautions Precautions: Fall Restrictions Weight Bearing Restrictions: No      Mobility Bed Mobility Overal bed mobility: Modified Independent             General bed mobility comments: increased time, slightly labored movement, c/o rib pain in supine    Transfers Overall transfer level: Needs assistance Equipment used: None Transfers: Sit to/from Stand Sit to Stand: Min assist           General transfer comment: patient able to sit to stand w/o AD, requires min assist when standing due to shakiness and unsteadiness on feet w/ c/o lightheadness and dizziness      Balance Overall balance assessment: Needs assistance Sitting-balance support: Feet supported, Bilateral upper extremity supported Sitting balance-Leahy Scale: Good Sitting balance - Comments: seated EOB   Standing balance support: Single extremity supported, During functional activity, Reliant on assistive device for balance Standing balance-Leahy Scale: Fair Standing balance comment: poor w/o AD, fair with SPC                           ADL either performed or assessed with clinical judgement   ADL Overall ADL's : Needs assistance/impaired Eating/Feeding: Set up;Sitting   Grooming: Min guard;Standing   Upper Body Bathing: Minimal assistance;Standing   Lower Body Bathing: Minimal assistance;Sitting/lateral leans;Sit to/from stand   Upper Body Dressing : Minimal assistance;Sitting   Lower Body Dressing: Minimal assistance;Sitting/lateral leans;Sit to/from stand   Toilet Transfer: Moderate assistance;Ambulation   Toileting- Clothing Manipulation and Hygiene: Minimal assistance;Sitting/lateral lean;Sit to/from stand       Functional mobility during ADLs: Moderate assistance General ADL Comments: Pt  limited due to R shoulder pain and weakness, as well as tremors and balance deficits.      Vision Baseline Vision/History: 0 No visual deficits Vision Assessment?: No apparent visual deficits     Perception     Praxis      Pertinent Vitals/Pain Pain Assessment Pain Assessment: Faces Faces Pain Scale: Hurts little more Pain Location: right upper ribs and right shoulder Pain Descriptors / Indicators: Sharp Pain Intervention(s): Limited activity within patient's tolerance, Monitored during session, Repositioned     Hand Dominance Right   Extremity/Trunk Assessment Upper Extremity Assessment Upper Extremity Assessment: Generalized weakness;RUE deficits/detail (severe tremors, globally) RUE Deficits / Details: Pt reports chronic shoulder problem RUE: Unable to fully assess due to pain;Shoulder pain with ROM RUE Sensation: WNL RUE Coordination: decreased fine motor;decreased gross motor   Lower Extremity Assessment Lower Extremity Assessment: Defer to PT evaluation   Cervical / Trunk Assessment Cervical / Trunk Assessment: Normal   Communication Communication Communication: No difficulties   Cognition Arousal/Alertness: Awake/alert Behavior During Therapy: WFL for tasks assessed/performed Overall Cognitive Status: Within Functional Limits for tasks assessed                                       General Comments  VSS on RA    Exercises     Shoulder Instructions      Home Living Family/patient expects to be discharged to:: Private residence Living Arrangements: Other relatives Available Help at Discharge: Family;Available 24 hours/day Type of Home: House Home Access: Ramped entrance     Home Layout: One level     Bathroom Shower/Tub: Teacher, early years/pre: Standard Bathroom Accessibility: Yes   Home Equipment: Conservation officer, nature (2 wheels);Grab bars - tub/shower          Prior Functioning/Environment Prior Level of Function : Independent/Modified Independent             Mobility Comments: community  ambulator w/o AD, drives (patient reports MD told him he now cannot drive for 6 months) ADLs Comments: Independent, sister has been assisting some with iADLs, pt reports he may need help with toileting ADLs going forward        OT Problem List: Decreased strength;Decreased activity tolerance;Impaired balance (sitting and/or standing);Decreased coordination;Decreased safety awareness;Cardiopulmonary status limiting activity;Impaired UE functional use      OT Treatment/Interventions: Self-care/ADL training;Therapeutic exercise;Energy conservation;DME and/or AE instruction;Therapeutic activities;Patient/family education;Balance training    OT Goals(Current goals can be found in the care plan section) Acute Rehab OT Goals Patient Stated Goal: To get his independence back OT Goal Formulation: With patient Time For Goal Achievement: 09/22/22 Potential to Achieve Goals: Good ADL Goals Pt Will Perform Grooming: with modified independence;standing Pt Will Perform Lower Body Bathing: with modified independence;sitting/lateral leans;sit to/from stand Pt Will Perform Lower Body Dressing: with modified independence;sit to/from stand;sitting/lateral leans Pt Will Transfer to Toilet: with modified independence;ambulating Pt Will Perform Toileting - Clothing Manipulation and hygiene: with modified independence;sit to/from stand;sitting/lateral leans  OT Frequency: Min 2X/week    Co-evaluation   Reason for Co-Treatment: To address functional/ADL transfers PT goals addressed during session: Mobility/safety with mobility;Balance;Proper use of DME        AM-PAC OT "6 Clicks" Daily Activity     Outcome Measure Help from another person eating meals?: A Little Help from another person taking care of personal grooming?: A Little Help from another person toileting, which includes using toliet,  bedpan, or urinal?: A Lot Help from another person bathing (including washing, rinsing, drying)?: A Lot Help  from another person to put on and taking off regular upper body clothing?: A Little Help from another person to put on and taking off regular lower body clothing?: A Little 6 Click Score: 16   End of Session Equipment Utilized During Treatment: Gait belt Nurse Communication: Mobility status  Activity Tolerance: Patient tolerated treatment well Patient left: in bed;with call bell/phone within reach  OT Visit Diagnosis: Unsteadiness on feet (R26.81);Other abnormalities of gait and mobility (R26.89);Muscle weakness (generalized) (M62.81)                Time: 7494-4967 OT Time Calculation (min): 24 min Charges:  OT General Charges $OT Visit: 1 Visit OT Evaluation $OT Eval Moderate Complexity: 1 Mod OT Treatments $Self Care/Home Management : 8-22 mins  Paulita Fujita, OTR/L [place] 09/08/2022, 11:45 AM

## 2022-09-08 NOTE — Plan of Care (Signed)
  Problem: Acute Rehab PT Goals(only PT should resolve) Goal: Pt Will Go Supine/Side To Sit Outcome: Progressing Flowsheets (Taken 09/08/2022 1111) Pt will go Supine/Side to Sit: Independently Goal: Patient Will Transfer Sit To/From Stand Outcome: Progressing Flowsheets (Taken 09/08/2022 1111) Patient will transfer sit to/from stand:  with supervision  with min guard assist Goal: Pt Will Transfer Bed To Chair/Chair To Bed Outcome: Progressing Flowsheets (Taken 09/08/2022 1111) Pt will Transfer Bed to Chair/Chair to Bed: min guard assist Goal: Pt Will Ambulate Outcome: Progressing Flowsheets (Taken 09/08/2022 1111) Pt will Ambulate:  with min guard assist  with minimal assist  50 feet  with cane Note: [place], SPT

## 2022-09-08 NOTE — Progress Notes (Signed)
Pt has arrived to 301. Alert and oriented x 4, identified appropriately. No signs of acute distress, VS stable, cardiac monitor in place and CCMD notified.  Pt oriented to room and equipment, instructed to use call bell for assistance, and call bell left within pt reach. Bed alarm in place.

## 2022-09-08 NOTE — NC FL2 (Signed)
[place] LEVEL OF CARE SCREENING TOOL     IDENTIFICATION  Patient Name: Spencer Mendoza Birthdate: [birth year] Sex: male Admission Date (Current Location): 09/07/2022  [facility] and [place] Number:  [employer] and Address:  [place] [address] [place]      Provider Number: [id number]  Attending Physician Name and Address:  Lavina Hamman, MD  Relative Name and Phone Number:       Current Level of Care: Hospital Recommended Level of Care: [place] Prior Approval Number:    Date Approved/Denied:   PASRR Number:    Discharge Plan: SNF    Current Diagnoses: Patient Active Problem List   Diagnosis Date Noted   Syncope 09/07/2022   Fall at home, initial encounter 09/07/2022   Sinus bradycardia 09/07/2022   Facial droop 09/07/2022   Rib fractures 09/07/2022   Alcohol abuse with withdrawal, uncomplicated ([place]) 45/40/9811   Coronary artery disease of native artery of native heart with stable angina pectoris ([place]) 02/03/2022   Prolonged Q-T interval on ECG    Intentional overdose ([place]) 08/26/2021   Exanthem due to herpes zoster 04/01/2021   Abdominal aortic atherosclerosis ([place]) with 50% occlusion distal aorta 03/05/2020   Osteoarthritis of left wrist 01/04/2020   Liver hemangioma 09/21/2019   Tobacco use 08/30/2019   COPD with chronic bronchitis 08/30/2019   Chronic migraine without aura, with intractable migraine, so stated, with status migrainosus 08/25/2019   MDD (major depressive disorder), recurrent severe, without psychosis ([place]) 11/18/2018   Chronic insomnia 06/06/2015   Seizures ([place]) 01/09/2014   Chronic low back pain 12/28/2013   History of stroke 12/04/2013   Hypertension 12/04/2013    Orientation RESPIRATION BLADDER Height & Weight     Self, Time, Situation, Place  Normal Continent Weight: 156 lb 1.4 oz (70.8 kg) Height:  6' (182.9 cm)  BEHAVIORAL SYMPTOMS/MOOD NEUROLOGICAL  BOWEL NUTRITION STATUS      Continent Diet (Heart healthy)  AMBULATORY STATUS COMMUNICATION OF NEEDS Skin   Extensive Assist Verbally Normal                       Personal Care Assistance Level of Assistance  Bathing, Feeding, Dressing Bathing Assistance: Limited assistance Feeding assistance: Independent Dressing Assistance: Limited assistance     Functional Limitations Info  Sight, Hearing, Speech Sight Info: Adequate Hearing Info: Adequate Speech Info: Adequate    SPECIAL CARE FACTORS FREQUENCY  PT (By licensed PT), OT (By licensed OT)     PT Frequency: 5 times weekly OT Frequency: 5 times weekly            Contractures Contractures Info: Not present    Additional Factors Info  Code Status, Allergies Code Status Info: FULL Allergies Info: Sertraline, Codeine, Depakote (Divalproex Sodium), Migranal (Dihydroergotamine), Topamax (Topiramate)           Current Medications (09/08/2022):  This is the current hospital active medication list Current Facility-Administered Medications  Medication Dose Route Frequency Provider Last Rate Last Admin   0.9 %  sodium chloride infusion   Intravenous Continuous Zierle-Ghosh, Asia B, DO 75 mL/hr at 09/08/22 0022 New Bag at 09/08/22 0022   acetaminophen (TYLENOL) tablet 650 mg  650 mg Oral Q6H PRN Zierle-Ghosh, Asia B, DO       Or   acetaminophen (TYLENOL) suppository 650 mg  650 mg Rectal Q6H PRN Zierle-Ghosh, Asia B, DO       albuterol (PROVENTIL) (2.5 MG/3ML)  0.083% nebulizer solution 2.5 mg  2.5 mg Nebulization Q6H PRN Zierle-Ghosh, Asia B, DO       heparin injection 5,000 Units  5,000 Units Subcutaneous Q8H Zierle-Ghosh, Asia B, DO   5,000 Units at 09/08/22 0628   levETIRAcetam (KEPPRA) tablet 1,000 mg  1,000 mg Oral BID Zierle-Ghosh, Asia B, DO   1,000 mg at 09/08/22 0956   midodrine (PROAMATINE) tablet 2.5 mg  2.5 mg Oral BID WC Arnoldo Lenis, MD       mometasone-formoterol [facility]) 200-5 MCG/ACT inhaler 2 puff  2  puff Inhalation BID Zierle-Ghosh, Asia B, DO   2 puff at 09/08/22 0818   morphine (PF) 2 MG/ML injection 2 mg  2 mg Intravenous Q2H PRN Zierle-Ghosh, Asia B, DO   2 mg at 09/08/22 0636   nicotine (NICODERM CQ - dosed in mg/24 hours) patch 21 mg  21 mg Transdermal Daily Zierle-Ghosh, Asia B, DO   21 mg at 09/08/22 0956   Oral care mouth rinse  15 mL Mouth Rinse PRN Zierle-Ghosh, Asia B, DO       oxyCODONE (Oxy IR/ROXICODONE) immediate release tablet 5 mg  5 mg Oral Q4H PRN Zierle-Ghosh, Asia B, DO   5 mg at 09/08/22 0745   prochlorperazine (COMPAZINE) injection 10 mg  10 mg Intravenous Q6H PRN Zierle-Ghosh, Asia B, DO   10 mg at 09/08/22 0826   rosuvastatin (CRESTOR) tablet 10 mg  10 mg Oral Daily Zierle-Ghosh, Asia B, DO   10 mg at 09/08/22 0955   sodium chloride flush (NS) 0.9 % injection 3 mL  3 mL Intravenous Q12H Zierle-Ghosh, Asia B, DO   3 mL at 09/08/22 0958   traZODone (DESYREL) tablet 100 mg  100 mg Oral QHS PRN Zierle-Ghosh, Asia B, DO   100 mg at 09/07/22 2327     Discharge Medications: Please see discharge summary for a list of discharge medications.  Relevant Imaging Results:  Relevant Lab Results:   Additional Information SSN: [id number] [place]

## 2022-09-09 ENCOUNTER — Telehealth: Payer: Self-pay | Admitting: Family

## 2022-09-09 ENCOUNTER — Other Ambulatory Visit (INDEPENDENT_AMBULATORY_CARE_PROVIDER_SITE_OTHER): Payer: Medicare HMO

## 2022-09-09 ENCOUNTER — Telehealth: Payer: Self-pay | Admitting: *Deleted

## 2022-09-09 DIAGNOSIS — R55 Syncope and collapse: Secondary | ICD-10-CM

## 2022-09-09 DIAGNOSIS — Z8673 Personal history of transient ischemic attack (TIA), and cerebral infarction without residual deficits: Secondary | ICD-10-CM

## 2022-09-09 LAB — CBC
HCT: 39.1 % (ref 39.0–52.0)
Hemoglobin: 13.6 g/dL (ref 13.0–17.0)
MCH: 32 pg (ref 26.0–34.0)
MCHC: 34.8 g/dL (ref 30.0–36.0)
MCV: 92 fL (ref 80.0–100.0)
Platelets: 199 10*3/uL (ref 150–400)
RBC: 4.25 MIL/uL (ref 4.22–5.81)
RDW: 13 % (ref 11.5–15.5)
WBC: 6.7 10*3/uL (ref 4.0–10.5)
nRBC: 0 % (ref 0.0–0.2)

## 2022-09-09 LAB — LIPID PANEL
Cholesterol: 143 mg/dL (ref 0–200)
HDL: 63 mg/dL (ref >40)
LDL Cholesterol: 63 mg/dL (ref 0–99)
Total CHOL/HDL Ratio: 2.3 RATIO
Triglycerides: 85 mg/dL (ref <150)
VLDL: 17 mg/dL (ref 0–40)

## 2022-09-09 LAB — BASIC METABOLIC PANEL
Anion gap: 8 (ref 5–15)
BUN: 5 mg/dL — ABNORMAL LOW (ref 8–23)
CO2: 26 mmol/L (ref 22–32)
Calcium: 9 mg/dL (ref 8.9–10.3)
Chloride: 102 mmol/L (ref 98–111)
Creatinine, Ser: 0.69 mg/dL (ref 0.61–1.24)
GFR, Estimated: >60 mL/min (ref >60)
Glucose, Bld: 88 mg/dL (ref 70–99)
Potassium: 3.6 mmol/L (ref 3.5–5.1)
Sodium: 136 mmol/L (ref 135–145)

## 2022-09-09 LAB — MAGNESIUM: Magnesium: 1.7 mg/dL (ref 1.7–2.4)

## 2022-09-09 MED ORDER — OXYCODONE-ACETAMINOPHEN 5-325 MG PO TABS: 1.0000 | ORAL_TABLET | ORAL | 0 refills | Status: DC | PRN

## 2022-09-09 MED ORDER — NICOTINE 21 MG/24HR TD PT24: 21.0000 mg | MEDICATED_PATCH | Freq: Every day | TRANSDERMAL | 0 refills | Status: DC

## 2022-09-09 MED ORDER — BENZONATATE 100 MG PO CAPS: 100.0000 mg | ORAL_CAPSULE | Freq: Three times a day (TID) | ORAL | 0 refills | Status: DC

## 2022-09-09 MED ORDER — CELECOXIB 200 MG PO CAPS: 200.0000 mg | ORAL_CAPSULE | Freq: Two times a day (BID) | ORAL | 0 refills | Status: AC

## 2022-09-09 MED ORDER — MIDODRINE HCL 2.5 MG PO TABS: 2.5000 mg | ORAL_TABLET | Freq: Two times a day (BID) | ORAL | 0 refills | Status: DC

## 2022-09-09 NOTE — TOC Transition Note (Addendum)
Transition of Care [facility]) - CM/SW Discharge Note   Patient Details  Name: Spencer Mendoza MRN: [id number] Date of Birth: [birth year]  [facility] CM/SW Contact:  Iona Beard, [place] Phone Number: 09/09/2022, 11:17 AM   Clinical Narrative:    CSW updated by MD that pt wishes to discharge home rather than SNF at this time. CSW spoke with pt in room who states that he would like to go home with [facility] services instead of going to SNF. CSW explained that [facility] PT can be set up. CSW requested [facility] orders. CSW reached out to Judson Roch with [employer] to see if any of the [place] agencies can accept [facility] referral. Pt states that he will be going to stay with his sister for a while and she lives at [place], [place]. Pt states that he does not need any DME. TOC signing off.   Final next level of care: [place] Barriers to Discharge: Barriers Resolved   Patient Goals and CMS Choice Patient states their goals for this hospitalization and ongoing recovery are:: return home with sister CMS Medicare.gov Compare Post Acute Care list provided to:: Patient Choice offered to / list presented to : Patient  Discharge Placement                       Discharge Plan and Services In-house Referral: Clinical Social Work Discharge Planning Services: CM Consult Post Acute Care Choice: [place] Arranged: PT          Social Determinants of Health (SDOH) Interventions     Readmission Risk Interventions     No data to display

## 2022-09-09 NOTE — Care Management Important Message (Signed)
Important Message  Patient Details  Name: Spencer Mendoza MRN: [id number] Date of Birth: 03-Oct-1961   Medicare Important Message Given:  N/A - LOS <3 / Initial given by admissions     Tommy Medal 09/09/2022, 12:21 PM

## 2022-09-09 NOTE — Telephone Encounter (Signed)
Caller States -pt recently released from [place] orders from hospital, but for PT only. -based on records and patient needs, pt may needed skilled nursing as well. -Trying to get patient schedule for first appointment on 09/11/22   Caller Requests: -Follow up, asap. [phone]

## 2022-09-09 NOTE — Progress Notes (Unsigned)
14 day live zio placed for syncope

## 2022-09-09 NOTE — Progress Notes (Addendum)
Progress Note  Patient Name: Spencer Mendoza Date of Encounter: 09/09/2022  Primary Cardiologist: Candee Furbish, MD  Subjective   Has right sided MSK CP when coughing, no dyspnea or palpitations. Had episode of dizziness/near syncope yesterday when standing with OT. VS not charted specifically at that time but telemetry without arrhythmia.  Inpatient Medications    Scheduled Meds:  benzonatate  100 mg Oral TID   heparin  5,000 Units Subcutaneous Q8H   levETIRAcetam  1,000 mg Oral BID   midodrine  2.5 mg Oral BID WC   mometasone-formoterol  2 puff Inhalation BID   nicotine  21 mg Transdermal Daily   rosuvastatin  10 mg Oral Daily   sodium chloride flush  3 mL Intravenous Q12H   Continuous Infusions:  sodium chloride 75 mL/hr at 09/08/22 0022   PRN Meds: acetaminophen **OR** acetaminophen, albuterol, [COMPLETED] HYDROcodone bit-homatropine **FOLLOWED BY** HYDROcodone bit-homatropine, morphine injection, mouth rinse, oxyCODONE, prochlorperazine, traZODone   Vital Signs    Vitals:   09/08/22 0820 09/08/22 2030 09/08/22 2112 09/09/22 0500  BP:   128/75 (!) 147/94  Pulse:  72 (!) 56 61  Resp:  '16 18 14  '$ Temp:   98.8 F (37.1 C) (!) 97.3 F (36.3 C)  TempSrc:      SpO2: 93% 97% 95% 96%  Weight:    67.4 kg  Height:        Intake/Output Summary (Last 24 hours) at 09/09/2022 0803 Last data filed at 09/09/2022 0500 Gross per 24 hour  Intake 1615.26 ml  Output 575 ml  Net 1040.26 ml      09/09/2022    5:00 AM 09/08/2022    5:00 AM 09/07/2022   12:27 PM  Last 3 Weights  Weight (lbs) 148 lb 9.4 oz 156 lb 1.4 oz 150 lb  Weight (kg) 67.4 kg 70.8 kg 68.04 kg     Telemetry    NSR/SB upper 40s-50s - Personally Reviewed  Physical Exam   GEN: No acute distress.  HEENT: Normocephalic, atraumatic, sclera non-icteric. Neck: No JVD or bruits. Cardiac: RRR no murmurs, rubs, or gallops.  Respiratory: Clear to auscultation bilaterally. Breathing is unlabored. GI: Soft,  nontender, non-distended, BS +x 4. MS: no deformity. Extremities: No clubbing or cyanosis. No edema. Distal pedal pulses are 2+ and equal bilaterally. Neuro:  AAOx3. Follows commands. Psych:  Responds to questions appropriately with a normal affect.  Labs    High Sensitivity Troponin:   Recent Labs  Lab 09/07/22 1247 09/07/22 1426  TROPONINIHS 2 <2      Cardiac EnzymesNo results for input(s): "TROPONINI" in the last 168 hours. No results for input(s): "TROPIPOC" in the last 168 hours.   Chemistry Recent Labs  Lab 09/07/22 1247 09/08/22 0420 09/09/22 0422  NA 138 138 136  K 3.9 3.0* 3.6  CL 101 101 102  CO2 '29 28 26  '$ GLUCOSE 110* 85 88  BUN 6* 6* 5*  CREATININE 0.91 0.73 0.69  CALCIUM 9.2 8.4* 9.0  PROT  --  5.2*  --   ALBUMIN  --  2.9*  --   AST  --  14*  --   ALT  --  11  --   ALKPHOS  --  84  --   BILITOT  --  0.5  --   GFRNONAA >60 >60 >60  ANIONGAP '8 9 8     '$ Hematology Recent Labs  Lab 09/07/22 1247 09/08/22 0420 09/09/22 0422  WBC 7.4 5.0 6.7  RBC 4.47 3.80* 4.25  HGB 14.4 12.1* 13.6  HCT 40.7 35.1* 39.1  MCV 91.1 92.4 92.0  MCH 32.2 31.8 32.0  MCHC 35.4 34.5 34.8  RDW 13.2 13.2 13.0  PLT 265 185 199    BNP Recent Labs  Lab 09/07/22 1247  BNP 66.0     DDimer  Recent Labs  Lab 09/07/22 1530  DDIMER 0.55*     Radiology    US Carotid Bilateral  Result Date: 09/08/2022 CLINICAL DATA:  Syncope EXAM: BILATERAL CAROTID DUPLEX ULTRASOUND TECHNIQUE: Pearline Cables scale imaging, color Doppler and duplex ultrasound were performed of bilateral carotid and vertebral arteries in the neck. COMPARISON:  None Available. FINDINGS: Criteria: Quantification of carotid stenosis is based on velocity parameters that correlate the residual internal carotid diameter with NASCET-based stenosis levels, using the diameter of the distal internal carotid lumen as the denominator for stenosis measurement. The following velocity measurements were obtained: RIGHT ICA: 117/32  cm/sec CCA: 92/42 cm/sec SYSTOLIC ICA/CCA RATIO:  1.4 ECA:  83 cm/sec LEFT ICA: 101/25 cm/sec CCA: 68/34 cm/sec SYSTOLIC ICA/CCA RATIO:  1.2 ECA:  90 cm/sec RIGHT CAROTID ARTERY: Trace heterogeneous atherosclerotic plaque in the proximal internal carotid artery. By peak systolic velocity criteria, the estimated stenosis remains less than 50%. RIGHT VERTEBRAL ARTERY:  Patent with normal antegrade flow. LEFT CAROTID ARTERY: Mild heterogeneous atherosclerotic plaque in the proximal internal carotid artery. By peak systolic velocity criteria, the estimated stenosis is less than 50%. LEFT VERTEBRAL ARTERY:  Patent with normal antegrade flow. IMPRESSION: 1. Mild (1-49%) stenosis proximal right internal carotid artery secondary to heterogenous atherosclerotic plaque. 2. Mild (1-49%) stenosis proximal left internal carotid artery secondary to heterogenous atherosclerotic plaque. 3. Vertebral arteries are patent with normal antegrade flow. Electronically Signed   By: Jacqulynn Cadet M.D.   On: 09/08/2022 13:50   ECHOCARDIOGRAM COMPLETE  Result Date: 09/08/2022    ECHOCARDIOGRAM REPORT   Patient Name:   Spencer Mendoza Date of Exam: 09/08/2022 Medical Rec #:  [id number]         Height:       72.0 in Accession #:    [id number]        Weight:       156.1 lb Date of Birth:  1960-11-20         BSA:          1.918 m Patient Age:    61 years          BP:           122/84 mmHg Patient Gender: M                 HR:           66 bpm. Exam Location:  [facility] Procedure: 2D Echo, Cardiac Doppler and Color Doppler Indications:    Syncope  History:        Patient has prior history of Echocardiogram examinations, most                 recent 03/22/2020. CAD, COPD and Stroke, Arrythmias:Bradycardia,                 Signs/Symptoms:Syncope; Risk Factors:Hypertension and Current                 Smoker. ETOH Abuse.  Sonographer:    Wenda Low Referring Phys: [id number] ASIA B [place]  1. Left ventricular ejection  fraction, by estimation, is 60 to 65%. The left ventricle has normal function. The left ventricle has no regional wall  motion abnormalities. There is mild left ventricular hypertrophy. Left ventricular diastolic parameters were normal.  2. Right ventricular systolic function is normal. The right ventricular size is normal. There is normal pulmonary artery systolic pressure.  3. The mitral valve is normal in structure. No evidence of mitral valve regurgitation. No evidence of mitral stenosis.  4. The aortic valve has an indeterminant number of cusps. Aortic valve regurgitation is not visualized. No aortic stenosis is present.  5. The inferior vena cava is normal in size with greater than 50% respiratory variability, suggesting right atrial pressure of 3 mmHg. FINDINGS  Left Ventricle: Left ventricular ejection fraction, by estimation, is 60 to 65%. The left ventricle has normal function. The left ventricle has no regional wall motion abnormalities. The left ventricular internal cavity size was normal in size. There is  mild left ventricular hypertrophy. Left ventricular diastolic parameters were normal. Right Ventricle: The right ventricular size is normal. Right vetricular wall thickness was not well visualized. Right ventricular systolic function is normal. There is normal pulmonary artery systolic pressure. The tricuspid regurgitant velocity is 2.14 m/s, and with an assumed right atrial pressure of 3 mmHg, the estimated right ventricular systolic pressure is 62.7 mmHg. Left Atrium: Left atrial size was normal in size. Right Atrium: Right atrial size was normal in size. Pericardium: There is no evidence of pericardial effusion. Mitral Valve: The mitral valve is normal in structure. No evidence of mitral valve regurgitation. No evidence of mitral valve stenosis. MV peak gradient, 2.5 mmHg. The mean mitral valve gradient is 1.0 mmHg. Tricuspid Valve: The tricuspid valve is normal in structure. Tricuspid valve  regurgitation is not demonstrated. No evidence of tricuspid stenosis. Aortic Valve: The aortic valve has an indeterminant number of cusps. Aortic valve regurgitation is not visualized. No aortic stenosis is present. Aortic valve mean gradient measures 2.0 mmHg. Aortic valve peak gradient measures 6.0 mmHg. Aortic valve area, by VTI measures 2.87 cm. Pulmonic Valve: The pulmonic valve was not well visualized. Pulmonic valve regurgitation is not visualized. No evidence of pulmonic stenosis. Aorta: The aortic root is normal in size and structure. Venous: The inferior vena cava is normal in size with greater than 50% respiratory variability, suggesting right atrial pressure of 3 mmHg. IAS/Shunts: No atrial level shunt detected by color flow Doppler.  LEFT VENTRICLE PLAX 2D LVIDd:         4.30 cm   Diastology LVIDs:         2.70 cm   LV e' medial:    8.38 cm/s LV PW:         1.10 cm   LV E/e' medial:  9.4 LV IVS:        1.10 cm   LV e' lateral:   10.80 cm/s LVOT diam:     2.00 cm   LV E/e' lateral: 7.3 LV SV:         77 LV SV Index:   40 LVOT Area:     3.14 cm  RIGHT VENTRICLE RV Basal diam:  3.50 cm RV Mid diam:    3.40 cm RV S prime:     13.40 cm/s TAPSE (M-mode): 2.6 cm LEFT ATRIUM             Index        RIGHT ATRIUM           Index LA diam:        3.40 cm 1.77 cm/m   RA Area:     18.10 cm  LA Vol (A2C):   46.2 ml 24.09 ml/m  RA Volume:   51.40 ml  26.81 ml/m LA Vol (A4C):   48.0 ml 25.03 ml/m LA Biplane Vol: 50.7 ml 26.44 ml/m  AORTIC VALVE                    PULMONIC VALVE AV Area (Vmax):    2.83 cm     PV Vmax:       0.90 m/s AV Area (Vmean):   3.13 cm     PV Peak grad:  3.2 mmHg AV Area (VTI):     2.87 cm AV Vmax:           122.00 cm/s AV Vmean:          68.100 cm/s AV VTI:            0.268 m AV Peak Grad:      6.0 mmHg AV Mean Grad:      2.0 mmHg LVOT Vmax:         110.00 cm/s LVOT Vmean:        67.800 cm/s LVOT VTI:          0.245 m LVOT/AV VTI ratio: 0.91  AORTA Ao Root diam: 3.80 cm MITRAL VALVE                TRICUSPID VALVE MV Area (PHT): 4.08 cm    TR Peak grad:   18.3 mmHg MV Area VTI:   2.52 cm    TR Vmax:        214.00 cm/s MV Peak grad:  2.5 mmHg MV Mean grad:  1.0 mmHg    SHUNTS MV Vmax:       0.79 m/s    Systemic VTI:  0.24 m MV Vmean:      44.7 cm/s   Systemic Diam: 2.00 cm MV Decel Time: 186 msec MV E velocity: 78.80 cm/s MV A velocity: 67.70 cm/s MV E/A ratio:  1.16 Carlyle Dolly MD Electronically signed by Carlyle Dolly MD Signature Date/Time: 09/08/2022/12:54:57 PM    Final    MR BRAIN WO CONTRAST  Result Date: 09/08/2022 CLINICAL DATA:  Transient ischemic attack (TIA) EXAM: MRI HEAD WITHOUT CONTRAST TECHNIQUE: Multiplanar, multiecho pulse sequences of the brain and surrounding structures were obtained without intravenous contrast. COMPARISON:  CT head September 07, 2022. FINDINGS: Brain: No acute infarction, hemorrhage, hydrocephalus, extra-axial collection or mass lesion. Vascular: Major arterial flow voids are maintained at the skull base. Skull and upper cervical spine: Normal marrow signal. Sinuses/Orbits: Scattered paranasal sinus mucosal thickening. No acute orbital findings. Other: Moderate left mastoid effusion. IMPRESSION: No evidence of acute intracranial abnormality. Electronically Signed   By: Margaretha Sheffield M.D.   On: 09/08/2022 09:20   CT Angio Chest PE W and/or Wo Contrast  Result Date: 09/07/2022 CLINICAL DATA:  Fall. Syncopal episode. Patient fell into a door trauma to his chest. EXAM: CT ANGIOGRAPHY CHEST WITH CONTRAST TECHNIQUE: Multidetector CT imaging of the chest was performed using the standard protocol during bolus administration of intravenous contrast. Multiplanar CT image reconstructions and MIPs were obtained to evaluate the vascular anatomy. RADIATION DOSE REDUCTION: This exam was performed according to the departmental dose-optimization program which includes automated exposure control, adjustment of the mA and/or kV according to patient size and/or  use of iterative reconstruction technique. CONTRAST:  81m OMNIPAQUE IOHEXOL 350 MG/ML SOLN COMPARISON:  Chest without contrast 09/07/2022 FINDINGS: Cardiovascular: Heart size is normal. Coronary artery calcifications are present. Atherosclerotic calcifications are  present in the aorta origin of the left subclavian artery. No significant stenosis or aneurysm is present. Pulmonary artery opacification is excellent. No focal filling defects are present to suggest pulmonary embolus. Pulmonary artery size is normal. Mediastinum/Nodes: No enlarged mediastinal, hilar, or axillary lymph nodes. Thyroid gland, trachea, and esophagus demonstrate no significant findings. Lungs/Pleura: Centrilobular and paraseptal emphysematous changes are again noted bilaterally. No nodule or mass lesion is present. No focal airspace consolidation is present. No contusion is present. Airways are patent. No significant pleural disease is present. Effusion or pneumothorax. Upper Abdomen: Limited imaging the abdomen is unremarkable. There is no significant adenopathy. No solid organ lesions are present. Musculoskeletal: Anterior right third rib fracture is displaced at least a half a shaft width. Nondisplaced anterior fourth and fifth rib fractures are noted. More remote right fourth, fifth and sixth fractures are again seen laterally. Additional fractures are present. Vertebral body heights are normal. No focal osseous lesions are present. Review of the MIP images confirms the above findings. IMPRESSION: 1. No pulmonary embolus. Normal opacification of pulmonary arteries. 2. Anterior right third rib fracture is displaced at least a half a shaft width. 3. Nondisplaced anterior fourth and fifth rib fractures are likely also acute. 4. Coronary artery disease. 5. More remote right fourth, fifth and sixth fractures are again seen laterally. 6. Aortic Atherosclerosis (ICD10-I70.0) and Emphysema (ICD10-J43.9). Electronically Signed   By: San Morelle M.D.   On: 09/07/2022 16:42   DG Shoulder Right  Result Date: 09/07/2022 CLINICAL DATA:  Trauma, fall EXAM: RIGHT SHOULDER - 2+ VIEW COMPARISON:  06/26/2022 FINDINGS: There is previous reverse arthroplasty in right shoulder. No recent fracture or dislocation is seen. No significant interval changes are noted. IMPRESSION: Previous right shoulder arthroplasty. No recent fracture or dislocation is seen. Electronically Signed   By: Elmer Picker M.D.   On: 09/07/2022 15:22   CT Chest Wo Contrast  Result Date: 09/07/2022 CLINICAL DATA:  Chest trauma. EXAM: CT CHEST WITHOUT CONTRAST TECHNIQUE: Multidetector CT imaging of the chest was performed following the standard protocol without IV contrast. RADIATION DOSE REDUCTION: This exam was performed according to the departmental dose-optimization program which includes automated exposure control, adjustment of the mA and/or kV according to patient size and/or use of iterative reconstruction technique. COMPARISON:  Chest CT dated 06/26/2022. FINDINGS: Cardiovascular: No thoracic aortic aneurysm. No pericardial effusion. Atherosclerosis of the thoracic aorta and coronary arteries. Mediastinum/Nodes: No mass or enlarged lymph nodes are seen within the mediastinum or perihilar regions. Esophagus appears normal. Trachea is unremarkable. Lungs/Pleura: Centrilobular and paraseptal emphysematous changes bilaterally, at least moderate in degree and upper lobe predominant. No suspicious nodule or mass. No evidence of pneumonia or pulmonary edema. No pleural effusion or pneumothorax. Upper Abdomen: No acute abnormality. Musculoskeletal: Acute slightly displaced fracture of the anterior RIGHT third rib. Multiple subacute and chronic rib fractures involving the RIGHT fourth fifth and sixth ribs. IMPRESSION: 1. Acute slightly displaced fracture of the anterior RIGHT third rib. 2. Multiple subacute and chronic rib fractures involving the RIGHT fourth through sixth  ribs. 3. No pleural effusion or pneumothorax. 4. Aortic and coronary artery atherosclerosis. Emphysema (ICD10-J43.9). Electronically Signed   By: Franki Cabot M.D.   On: 09/07/2022 15:18   CT Head Wo Contrast  Result Date: 09/07/2022 CLINICAL DATA:  Head trauma, moderate to severe. EXAM: CT HEAD WITHOUT CONTRAST TECHNIQUE: Contiguous axial images were obtained from the base of the skull through the vertex without intravenous contrast. RADIATION DOSE REDUCTION: This exam was performed  according to the departmental dose-optimization program which includes automated exposure control, adjustment of the mA and/or kV according to patient size and/or use of iterative reconstruction technique. COMPARISON:  None Available. FINDINGS: Brain: No hydrocephalus. No mass, hemorrhage, edema or other evidence of acute parenchymal abnormality. No extra-axial hemorrhage. Vascular: Chronic calcified atherosclerotic changes of the large vessels at the skull base. No unexpected hyperdense vessel. Skull: Normal. Negative for fracture or focal lesion. Sinuses/Orbits: No acute findings. Chronic-appearing mucosal thickening within the ethmoid air cells and sphenoid sinus. Other: None. IMPRESSION: 1. No acute findings. No intracranial mass, hemorrhage or edema. No skull fracture. 2. Chronic-appearing paranasal sinus disease. Electronically Signed   By: Franki Cabot M.D.   On: 09/07/2022 15:11   DG Chest 2 View  Result Date: 09/07/2022 CLINICAL DATA:  Fall and chest pain EXAM: CHEST - 2 VIEW COMPARISON:  CT chest dated 06/26/2022, chest radiograph dated 05/23/2021, right shoulder radiographs dated 06/26/2022 FINDINGS: The patient is rotated to the right. Normal lung volumes. Patchy opacity in the lateral right upper. Diffuse bilateral reticular opacities. No pleural effusion or pneumothorax. The heart size and mediastinal contours are within normal limits. Reverse total right shoulder replacement. IMPRESSION: Patchy opacity in the  lateral right upper lobe may reflect a focus of pulmonary contusion in the setting of trauma. Recommend follow-up chest x-ray in 4-6 weeks to ensure resolution. Electronically Signed   By: Darrin Nipper M.D.   On: 09/07/2022 13:13    Cardiac Studies   2D echo 09/08/22    1. Left ventricular ejection fraction, by estimation, is 60 to 65%. The  left ventricle has normal function. The left ventricle has no regional  wall motion abnormalities. There is mild left ventricular hypertrophy.  Left ventricular diastolic parameters  were normal.   2. Right ventricular systolic function is normal. The right ventricular  size is normal. There is normal pulmonary artery systolic pressure.   3. The mitral valve is normal in structure. No evidence of mitral valve  regurgitation. No evidence of mitral stenosis.   4. The aortic valve has an indeterminant number of cusps. Aortic valve  regurgitation is not visualized. No aortic stenosis is present.   5. The inferior vena cava is normal in size with greater than 50%  respiratory variability, suggesting right atrial pressure of 3 mmHg.   Carotid duplex 09/08/22 IMPRESSION: 1. Mild (1-49%) stenosis proximal right internal carotid artery secondary to heterogenous atherosclerotic plaque. 2. Mild (1-49%) stenosis proximal left internal carotid artery secondary to heterogenous atherosclerotic plaque. 3. Vertebral arteries are patent with normal antegrade flow.      Patient Profile     61 y.o. male with nonobstructive CAD by coronary CT 03/2020, vasovagal syncope, PVD (stenosis of IMA, bilateral iliac arteries, and <50% distal aorta seen by [facility] in 2021), carotid artery disease (09-32% RICA and 6-71% LICA in 2458), sinus bradycardia/PSVT/PAT/junctional tach by monitor 2021, alcohol abuse, cocaine abuse, epilepsy, migraines, COPD, emphysema by prior CT who is being seen 09/08/2022 for the evaluation of recurrent LOC at the request of Dr. Posey Pronto.  Assessment & Plan     1. Recurrent loss of consciousness complicated by rib fractures, h/o vasovagal syncope with orthostatic component - orthostatic VS neg for drop in BP but did demonstrate rise in HR c/w orthostasis -  brain imaging unrevealing - episodes are somewhat atypical in that they have resulted in some bowel and bladder incontinence, and the length of last LOC was ? 30-40 minutes per patient's report from sister -  discussed no driving x 6 months yesterday - echo reassuring, only mild LVH, otherwise normal LV/RV - sent msg to our office to help arrange 14 day live [facility] at discharge, Dr. Posey Pronto added to this secure chat to notify when patient is going home - Dr. Harl Bowie added low dose midodrine  -need to watch supine HTN - recommend close OP neurology f/u as well  2. Mild-moderate CAD by CTA 2021, negative troponins - continue rosuvastatin, consider outpatient titration once acute issues settled, LDL 63 - no recent anginal type chest pain   3. Epilepsy - defer mgmt to primary team   4. PSVT, PAT, junctional tach by monitor 2021 - quiescent   5. Prior PAD with stenosis of IMA, bilateral iliac arteries, and <50% distal aorta seen by [facility] in 2021, intended for OP f/u but did not attend, also with 53-66% RICA and 4-40% LICA stenosis in 3474 - repeat carotid duplex with only 1-49% BICA, no significant progression - recommend to revisit follow-up plans at post-hospital visit - put reminder on AVS for patient to call to schedule overdue f/u   6. Hypokalemia - primary team to manage electrolytes   7. Unintentional weight loss, chronic diarrhea - recommended pt see PCP/GI in follow-up to discuss - TSH Ok  I scheduled OP f/u 10/15/22 and put on AVS. Would also benefit from sooner PCP/neuro f/u.  For questions or updates, please contact [facility] Please consult [email or web address] for contact info under Cardiology/STEMI.  Signed, Charlie Pitter, PA-C 09/09/2022, 8:03 AM    Attending  note Patient seen and discussed with PA Dunn, I agree with her documentation.  1.Syncope - history of recurrent syncope. By history very suggestive of orthostatic syncope. Extensive workup this admission without other alternative cause. Some sinus brady at time 40s to 59s however history not consistent with bradyarrhythmia as cause but do plan for outpatient monitor - orthostatic vitals normal by bp, HR increased by 26 beats. He reports adequate hydration but does have chronic diarrhea - given frequent of falls and multiple injuries we have started midodrine 2.'5mg'$  bid, titrate over time. Continue aggressive hydration and sodium intake.   [facility] for discharge from cardiac standpoint, we will sign off inpatient care. Monitor and f/u have been arranged   Carlyle Dolly MD

## 2022-09-09 NOTE — Telephone Encounter (Signed)
Instant msg from [employer], PA-C to place live [place] monitor for syncope. Monitor placed and instructions given to pt.

## 2022-09-10 ENCOUNTER — Other Ambulatory Visit: Payer: Self-pay

## 2022-09-10 ENCOUNTER — Telehealth: Payer: Self-pay

## 2022-09-10 DIAGNOSIS — R55 Syncope and collapse: Secondary | ICD-10-CM | POA: Diagnosis not present

## 2022-09-10 NOTE — Telephone Encounter (Signed)
I have called back and spoke with a nurse in regards to Home health. She will be faxing over paperwork for [name] to fill out ASAP.

## 2022-09-10 NOTE — Telephone Encounter (Signed)
Transition Care Management Follow-up Telephone Call Date of discharge and from where: [facility] 09/09/2022 How have you been since you were released from the hospital? weak Any questions or concerns? Yes patients sister wants him to go to rehab  Items Reviewed: Did the pt receive and understand the discharge instructions provided? Yes  Medications obtained and verified? Yes  Other? No  Any new allergies since your discharge? No  Dietary orders reviewed? Yes Do you have support at home? Yes   Home Care and Equipment/Supplies: Were home health services ordered? no If so, what is the name of the agency? N/a  Has the agency set up a time to come to the patient's home? no Were any new equipment or medical supplies ordered?  No What is the name of the medical supply agency? N/a Were you able to get the supplies/equipment? no Do you have any questions related to the use of the equipment or supplies? No  Functional Questionnaire: (I = Independent and D = Dependent) ADLs: I  Bathing/Dressing- I  Meal Prep- D  Eating- I  Maintaining continence- I  Transferring/Ambulation- D  Managing Meds- I  Follow up appointments reviewed:  PCP Hospital f/u appt confirmed? Yes  Scheduled to see  Jeanie Sewer on 09/11/2022 @ 11:00. [facility] f/u appt confirmed? No   Are transportation arrangements needed? No  If their condition worsens, is the pt aware to call PCP or go to the Emergency Dept.? Yes Was the patient provided with contact information for the PCP's office or ED? Yes Was to pt encouraged to call back with questions or concerns? Yes Juanda Crumble, LPN [place] [facility] [phone]

## 2022-09-10 NOTE — Discharge Summary (Signed)
Physician Discharge Summary   Patient: Spencer Mendoza MRN: [id number] DOB: 1961-08-12  Admit date:     09/07/2022  Discharge date: 09/09/2022  Discharge Physician: Berle Mull  PCP: Jeanie Sewer, NP  Recommendations at discharge:  Follow up with PCP in 1 week   Follow-up Information     Dunn, Dayna N, PA-C Follow up.   Specialties: Cardiology, Radiology Why: [place] location - cardiology follow-up appointment is on Wednesday Oct 15, 2022 at 10:55 AM (Arrive by 10:40 AM). Lisbeth Renshaw is one of the PAs that works with Dr. Marlou Porch. Contact information: [address] [place] [address] [phone]         Angelia Mould, MD Follow up.   Specialties: Vascular Surgery, Cardiology Why: You are overdue for follow-up with your vascular surgery team. Please contact their office for an appointment. Contact information: [address] [place] [address] [phone]         Jeanie Sewer, NP. Schedule an appointment as soon as possible for a visit in 1 week(s).   Specialty: Family Medicine Contact information: [place] [address] [phone]                Discharge Diagnoses: Principal Problem:   Syncope Active Problems:   Seizures ([place])   Tobacco use   COPD with chronic bronchitis   Fall at home, initial encounter   Sinus bradycardia   Facial droop   Rib fractures   Multiple rib fractures  Hospital Course: PMH of CAD, CVA, COPD, smoker, seizure, substance abuse present to the hospital with complaints of passing out event. Found to have multiple rib fractures. Denies any recent change in medication but does note that he lost 40 pounds of weight in the last few months. Does not drink alcohol 2 beers every other day as well as uses marijuana. PT recommended going to SNF. Pt wanted to go home instead of SNF. Home health PT was arranged.   Assessment and Plan  Recurrent  syncope. Orthostatic vitals are negative. Patient was bradycardic but does not have any significant cardiac arrhythmia. No events on telemetry. [place] cardiology consultation. Started on midodrine. CT PE negative. Echocardiogram no significant abnormality. EF normal   Multiple rib fractures. Patient appears to have acute to subacute rib fractures based on CT scan. Trauma surgery was consulted and recommended the patient can be at any point hospital. Continue incentive spirometer hypertension continue pain control. Continue cough control. Pt recommended to take NSAIDS for inflammation improvement.  At which time pt requested to be discharged home.    History of seizures. On Keppra 1000 mg twice daily. Discussed with neurology. Recommended to continue Keppra 1000 mg twice daily.   Chronic Right facial droop. MRI brain negative for acute stroke.   Sinus bradycardia. Not on any rate controlling medication. Etiology not clear. [place] cardiology consultation.   Active smoker. Nicotine patch.   Alcohol use. Monitor for withdrawal    Hypokalemia. corrected.   Polysubstance abuse. UDS positive for opioids which was given to the patient in the hospital. Also positive for cannabis. Counseled to quit.   Unintentional weight loss. Etiology not clear potentially could be related to his diarrhea. Monitor for now.   Chronic diarrhea prevention patient reports that he has chronic diarrhea with 2-3 loose watery bowel movements ongoing for last 1 month. Patient also reports 40 pound weight loss over last 6 months. Recommend outpatient follow-up with GI  Pt does not have  diarrhea here in the hospital.  Pain control - [facility] Controlled Substance Reporting System database was reviewed. and patient was instructed, not to drive, operate heavy machinery, perform activities at heights, swimming or participation in water activities or provide baby-sitting services while on  Pain, Sleep and Anxiety Medications; until their outpatient Physician has advised to do so again. Also recommended to not to take more than prescribed Pain, Sleep and Anxiety Medications.  Consultants:  Cardiology  Procedures performed:  Echocardiogram   DISCHARGE MEDICATION: Allergies as of 09/09/2022       Reactions   Sertraline Other (See Comments)   Erectile dysfunction   Codeine Nausea And Vomiting   Depakote [divalproex Sodium] Other (See Comments)   Made patient shake   Migranal [dihydroergotamine] Nausea And Vomiting   Topamax [topiramate] Other (See Comments)   Made patient angry        Medication List     STOP taking these medications    meclizine 25 MG tablet Commonly known as: ANTIVERT   traMADol 50 MG tablet Commonly known as: ULTRAM       TAKE these medications    acetaminophen 500 MG tablet Commonly known as: TYLENOL Take 1,000 mg by mouth 2 (two) times daily as needed (pain).   acidophilus Caps capsule Take 1 capsule by mouth daily.   albuterol 108 (90 Base) MCG/ACT inhaler Commonly known as: VENTOLIN HFA INHALE 2 PUFFS BY MOUTH INTO THE LUNGS EVERY 4 HOURS AS NEEDED FOR SHORTNESS OF BREATH OR WHEEZING What changed:  how much to take how to take this when to take this reasons to take this additional instructions   benzonatate 100 MG capsule Commonly known as: TESSALON Take 1 capsule (100 mg total) by mouth 3 (three) times daily.   budesonide-formoterol 160-4.5 MCG/ACT inhaler Commonly known as: Symbicort Inhale 2 puffs into the lungs 2 (two) times daily.   celecoxib 200 MG capsule Commonly known as: CeleBREX Take 1 capsule (200 mg total) by mouth 2 (two) times daily for 7 days.   levETIRAcetam 1000 MG tablet Commonly known as: Keppra Take 1 tablet (1,000 mg total) by mouth 2 (two) times daily.   midodrine 2.5 MG tablet Commonly known as: PROAMATINE Take 1 tablet (2.5 mg total) by mouth 2 (two) times daily with a meal.    nicotine 21 mg/24hr patch Commonly known as: NICODERM CQ - dosed in mg/24 hours Place 1 patch (21 mg total) onto the skin daily.   oxyCODONE-acetaminophen 5-325 MG tablet Commonly known as: Percocet Take 1 tablet by mouth every 4 (four) hours as needed for severe pain.   rosuvastatin 10 MG tablet Commonly known as: CRESTOR Take 1 tablet (10 mg total) by mouth daily.   traZODone 100 MG tablet Commonly known as: DESYREL TAKE 1 TO 2 TABLETS(100 TO 200 MG) BY MOUTH AT BEDTIME       Disposition: Home Diet recommendation: Cardiac diet  Discharge Exam: Vitals:   09/08/22 2030 09/08/22 2112 09/09/22 0500 09/09/22 0813  BP:  128/75 (!) 147/94   Pulse: 72 (!) 56 61   Resp: '16 18 14   '$ Temp:  98.8 F (37.1 C) (!) 97.3 F (36.3 C)   TempSrc:      SpO2: 97% 95% 96% 97%  Weight:   67.4 kg   Height:       General: Appear in mild distress; no visible Abnormal Neck Mass Or lumps, Conjunctiva normal Cardiovascular: S1 and S2 Present, no Murmur, Respiratory: good respiratory effort, Bilateral Air entry  present and CTA, no Crackles, no wheezes Abdomen: Bowel Sound present, Non tender  Extremities: no Pedal edema Neurology: alert and oriented to time, place, and person  Filed Weights   09/07/22 1227 09/08/22 0500 09/09/22 0500  Weight: 68 kg 70.8 kg 67.4 kg   Condition at discharge: stable  The results of significant diagnostics from this hospitalization (including imaging, microbiology, ancillary and laboratory) are listed below for reference.   Imaging Studies: US Carotid Bilateral  Result Date: 09/08/2022 CLINICAL DATA:  Syncope EXAM: BILATERAL CAROTID DUPLEX ULTRASOUND TECHNIQUE: Pearline Cables scale imaging, color Doppler and duplex ultrasound were performed of bilateral carotid and vertebral arteries in the neck. COMPARISON:  None Available. FINDINGS: Criteria: Quantification of carotid stenosis is based on velocity parameters that correlate the residual internal carotid diameter with  NASCET-based stenosis levels, using the diameter of the distal internal carotid lumen as the denominator for stenosis measurement. The following velocity measurements were obtained: RIGHT ICA: 117/32 cm/sec CCA: 02/58 cm/sec SYSTOLIC ICA/CCA RATIO:  1.4 ECA:  83 cm/sec LEFT ICA: 101/25 cm/sec CCA: 52/77 cm/sec SYSTOLIC ICA/CCA RATIO:  1.2 ECA:  90 cm/sec RIGHT CAROTID ARTERY: Trace heterogeneous atherosclerotic plaque in the proximal internal carotid artery. By peak systolic velocity criteria, the estimated stenosis remains less than 50%. RIGHT VERTEBRAL ARTERY:  Patent with normal antegrade flow. LEFT CAROTID ARTERY: Mild heterogeneous atherosclerotic plaque in the proximal internal carotid artery. By peak systolic velocity criteria, the estimated stenosis is less than 50%. LEFT VERTEBRAL ARTERY:  Patent with normal antegrade flow. IMPRESSION: 1. Mild (1-49%) stenosis proximal right internal carotid artery secondary to heterogenous atherosclerotic plaque. 2. Mild (1-49%) stenosis proximal left internal carotid artery secondary to heterogenous atherosclerotic plaque. 3. Vertebral arteries are patent with normal antegrade flow. Electronically Signed   By: Jacqulynn Cadet M.D.   On: 09/08/2022 13:50   ECHOCARDIOGRAM COMPLETE  Result Date: 09/08/2022    ECHOCARDIOGRAM REPORT   Patient Name:   NIKOLA MARONE Date of Exam: 09/08/2022 Medical Rec #:  [id number]         Height:       72.0 in Accession #:    [id number]        Weight:       156.1 lb Date of Birth:  1961/08/04         BSA:          1.918 m Patient Age:    41 years          BP:           122/84 mmHg Patient Gender: M                 HR:           66 bpm. Exam Location:  [facility] Procedure: 2D Echo, Cardiac Doppler and Color Doppler Indications:    Syncope  History:        Patient has prior history of Echocardiogram examinations, most                 recent 03/22/2020. CAD, COPD and Stroke, Arrythmias:Bradycardia,                  Signs/Symptoms:Syncope; Risk Factors:Hypertension and Current                 Smoker. ETOH Abuse.  Sonographer:    Wenda Low Referring Phys: [id number] ASIA B [place]  1. Left ventricular ejection fraction, by estimation, is 60 to 65%. The left ventricle has normal function. The  left ventricle has no regional wall motion abnormalities. There is mild left ventricular hypertrophy. Left ventricular diastolic parameters were normal.  2. Right ventricular systolic function is normal. The right ventricular size is normal. There is normal pulmonary artery systolic pressure.  3. The mitral valve is normal in structure. No evidence of mitral valve regurgitation. No evidence of mitral stenosis.  4. The aortic valve has an indeterminant number of cusps. Aortic valve regurgitation is not visualized. No aortic stenosis is present.  5. The inferior vena cava is normal in size with greater than 50% respiratory variability, suggesting right atrial pressure of 3 mmHg. FINDINGS  Left Ventricle: Left ventricular ejection fraction, by estimation, is 60 to 65%. The left ventricle has normal function. The left ventricle has no regional wall motion abnormalities. The left ventricular internal cavity size was normal in size. There is  mild left ventricular hypertrophy. Left ventricular diastolic parameters were normal. Right Ventricle: The right ventricular size is normal. Right vetricular wall thickness was not well visualized. Right ventricular systolic function is normal. There is normal pulmonary artery systolic pressure. The tricuspid regurgitant velocity is 2.14 m/s, and with an assumed right atrial pressure of 3 mmHg, the estimated right ventricular systolic pressure is 68.1 mmHg. Left Atrium: Left atrial size was normal in size. Right Atrium: Right atrial size was normal in size. Pericardium: There is no evidence of pericardial effusion. Mitral Valve: The mitral valve is normal in structure. No evidence of  mitral valve regurgitation. No evidence of mitral valve stenosis. MV peak gradient, 2.5 mmHg. The mean mitral valve gradient is 1.0 mmHg. Tricuspid Valve: The tricuspid valve is normal in structure. Tricuspid valve regurgitation is not demonstrated. No evidence of tricuspid stenosis. Aortic Valve: The aortic valve has an indeterminant number of cusps. Aortic valve regurgitation is not visualized. No aortic stenosis is present. Aortic valve mean gradient measures 2.0 mmHg. Aortic valve peak gradient measures 6.0 mmHg. Aortic valve area, by VTI measures 2.87 cm. Pulmonic Valve: The pulmonic valve was not well visualized. Pulmonic valve regurgitation is not visualized. No evidence of pulmonic stenosis. Aorta: The aortic root is normal in size and structure. Venous: The inferior vena cava is normal in size with greater than 50% respiratory variability, suggesting right atrial pressure of 3 mmHg. IAS/Shunts: No atrial level shunt detected by color flow Doppler.  LEFT VENTRICLE PLAX 2D LVIDd:         4.30 cm   Diastology LVIDs:         2.70 cm   LV e' medial:    8.38 cm/s LV PW:         1.10 cm   LV E/e' medial:  9.4 LV IVS:        1.10 cm   LV e' lateral:   10.80 cm/s LVOT diam:     2.00 cm   LV E/e' lateral: 7.3 LV SV:         77 LV SV Index:   40 LVOT Area:     3.14 cm  RIGHT VENTRICLE RV Basal diam:  3.50 cm RV Mid diam:    3.40 cm RV S prime:     13.40 cm/s TAPSE (M-mode): 2.6 cm LEFT ATRIUM             Index        RIGHT ATRIUM           Index LA diam:        3.40 cm 1.77 cm/m   RA Area:  18.10 cm LA Vol (A2C):   46.2 ml 24.09 ml/m  RA Volume:   51.40 ml  26.81 ml/m LA Vol (A4C):   48.0 ml 25.03 ml/m LA Biplane Vol: 50.7 ml 26.44 ml/m  AORTIC VALVE                    PULMONIC VALVE AV Area (Vmax):    2.83 cm     PV Vmax:       0.90 m/s AV Area (Vmean):   3.13 cm     PV Peak grad:  3.2 mmHg AV Area (VTI):     2.87 cm AV Vmax:           122.00 cm/s AV Vmean:          68.100 cm/s AV VTI:            0.268  m AV Peak Grad:      6.0 mmHg AV Mean Grad:      2.0 mmHg LVOT Vmax:         110.00 cm/s LVOT Vmean:        67.800 cm/s LVOT VTI:          0.245 m LVOT/AV VTI ratio: 0.91  AORTA Ao Root diam: 3.80 cm MITRAL VALVE               TRICUSPID VALVE MV Area (PHT): 4.08 cm    TR Peak grad:   18.3 mmHg MV Area VTI:   2.52 cm    TR Vmax:        214.00 cm/s MV Peak grad:  2.5 mmHg MV Mean grad:  1.0 mmHg    SHUNTS MV Vmax:       0.79 m/s    Systemic VTI:  0.24 m MV Vmean:      44.7 cm/s   Systemic Diam: 2.00 cm MV Decel Time: 186 msec MV E velocity: 78.80 cm/s MV A velocity: 67.70 cm/s MV E/A ratio:  1.16 Carlyle Dolly MD Electronically signed by Carlyle Dolly MD Signature Date/Time: 09/08/2022/12:54:57 PM    Final    MR BRAIN WO CONTRAST  Result Date: 09/08/2022 CLINICAL DATA:  Transient ischemic attack (TIA) EXAM: MRI HEAD WITHOUT CONTRAST TECHNIQUE: Multiplanar, multiecho pulse sequences of the brain and surrounding structures were obtained without intravenous contrast. COMPARISON:  CT head September 07, 2022. FINDINGS: Brain: No acute infarction, hemorrhage, hydrocephalus, extra-axial collection or mass lesion. Vascular: Major arterial flow voids are maintained at the skull base. Skull and upper cervical spine: Normal marrow signal. Sinuses/Orbits: Scattered paranasal sinus mucosal thickening. No acute orbital findings. Other: Moderate left mastoid effusion. IMPRESSION: No evidence of acute intracranial abnormality. Electronically Signed   By: Margaretha Sheffield M.D.   On: 09/08/2022 09:20   CT Angio Chest PE W and/or Wo Contrast  Result Date: 09/07/2022 CLINICAL DATA:  Fall. Syncopal episode. Patient fell into a door trauma to his chest. EXAM: CT ANGIOGRAPHY CHEST WITH CONTRAST TECHNIQUE: Multidetector CT imaging of the chest was performed using the standard protocol during bolus administration of intravenous contrast. Multiplanar CT image reconstructions and MIPs were obtained to evaluate the vascular  anatomy. RADIATION DOSE REDUCTION: This exam was performed according to the departmental dose-optimization program which includes automated exposure control, adjustment of the mA and/or kV according to patient size and/or use of iterative reconstruction technique. CONTRAST:  68m OMNIPAQUE IOHEXOL 350 MG/ML SOLN COMPARISON:  Chest without contrast 09/07/2022 FINDINGS: Cardiovascular: Heart size is normal. Coronary artery calcifications are present. Atherosclerotic  calcifications are present in the aorta origin of the left subclavian artery. No significant stenosis or aneurysm is present. Pulmonary artery opacification is excellent. No focal filling defects are present to suggest pulmonary embolus. Pulmonary artery size is normal. Mediastinum/Nodes: No enlarged mediastinal, hilar, or axillary lymph nodes. Thyroid gland, trachea, and esophagus demonstrate no significant findings. Lungs/Pleura: Centrilobular and paraseptal emphysematous changes are again noted bilaterally. No nodule or mass lesion is present. No focal airspace consolidation is present. No contusion is present. Airways are patent. No significant pleural disease is present. Effusion or pneumothorax. Upper Abdomen: Limited imaging the abdomen is unremarkable. There is no significant adenopathy. No solid organ lesions are present. Musculoskeletal: Anterior right third rib fracture is displaced at least a half a shaft width. Nondisplaced anterior fourth and fifth rib fractures are noted. More remote right fourth, fifth and sixth fractures are again seen laterally. Additional fractures are present. Vertebral body heights are normal. No focal osseous lesions are present. Review of the MIP images confirms the above findings. IMPRESSION: 1. No pulmonary embolus. Normal opacification of pulmonary arteries. 2. Anterior right third rib fracture is displaced at least a half a shaft width. 3. Nondisplaced anterior fourth and fifth rib fractures are likely also  acute. 4. Coronary artery disease. 5. More remote right fourth, fifth and sixth fractures are again seen laterally. 6. Aortic Atherosclerosis (ICD10-I70.0) and Emphysema (ICD10-J43.9). Electronically Signed   By: San Morelle M.D.   On: 09/07/2022 16:42   DG Shoulder Right  Result Date: 09/07/2022 CLINICAL DATA:  Trauma, fall EXAM: RIGHT SHOULDER - 2+ VIEW COMPARISON:  06/26/2022 FINDINGS: There is previous reverse arthroplasty in right shoulder. No recent fracture or dislocation is seen. No significant interval changes are noted. IMPRESSION: Previous right shoulder arthroplasty. No recent fracture or dislocation is seen. Electronically Signed   By: Elmer Picker M.D.   On: 09/07/2022 15:22   CT Chest Wo Contrast  Result Date: 09/07/2022 CLINICAL DATA:  Chest trauma. EXAM: CT CHEST WITHOUT CONTRAST TECHNIQUE: Multidetector CT imaging of the chest was performed following the standard protocol without IV contrast. RADIATION DOSE REDUCTION: This exam was performed according to the departmental dose-optimization program which includes automated exposure control, adjustment of the mA and/or kV according to patient size and/or use of iterative reconstruction technique. COMPARISON:  Chest CT dated 06/26/2022. FINDINGS: Cardiovascular: No thoracic aortic aneurysm. No pericardial effusion. Atherosclerosis of the thoracic aorta and coronary arteries. Mediastinum/Nodes: No mass or enlarged lymph nodes are seen within the mediastinum or perihilar regions. Esophagus appears normal. Trachea is unremarkable. Lungs/Pleura: Centrilobular and paraseptal emphysematous changes bilaterally, at least moderate in degree and upper lobe predominant. No suspicious nodule or mass. No evidence of pneumonia or pulmonary edema. No pleural effusion or pneumothorax. Upper Abdomen: No acute abnormality. Musculoskeletal: Acute slightly displaced fracture of the anterior RIGHT third rib. Multiple subacute and chronic rib  fractures involving the RIGHT fourth fifth and sixth ribs. IMPRESSION: 1. Acute slightly displaced fracture of the anterior RIGHT third rib. 2. Multiple subacute and chronic rib fractures involving the RIGHT fourth through sixth ribs. 3. No pleural effusion or pneumothorax. 4. Aortic and coronary artery atherosclerosis. Emphysema (ICD10-J43.9). Electronically Signed   By: Franki Cabot M.D.   On: 09/07/2022 15:18   CT Head Wo Contrast  Result Date: 09/07/2022 CLINICAL DATA:  Head trauma, moderate to severe. EXAM: CT HEAD WITHOUT CONTRAST TECHNIQUE: Contiguous axial images were obtained from the base of the skull through the vertex without intravenous contrast. RADIATION DOSE REDUCTION: This exam  was performed according to the departmental dose-optimization program which includes automated exposure control, adjustment of the mA and/or kV according to patient size and/or use of iterative reconstruction technique. COMPARISON:  None Available. FINDINGS: Brain: No hydrocephalus. No mass, hemorrhage, edema or other evidence of acute parenchymal abnormality. No extra-axial hemorrhage. Vascular: Chronic calcified atherosclerotic changes of the large vessels at the skull base. No unexpected hyperdense vessel. Skull: Normal. Negative for fracture or focal lesion. Sinuses/Orbits: No acute findings. Chronic-appearing mucosal thickening within the ethmoid air cells and sphenoid sinus. Other: None. IMPRESSION: 1. No acute findings. No intracranial mass, hemorrhage or edema. No skull fracture. 2. Chronic-appearing paranasal sinus disease. Electronically Signed   By: Franki Cabot M.D.   On: 09/07/2022 15:11   DG Chest 2 View  Result Date: 09/07/2022 CLINICAL DATA:  Fall and chest pain EXAM: CHEST - 2 VIEW COMPARISON:  CT chest dated 06/26/2022, chest radiograph dated 05/23/2021, right shoulder radiographs dated 06/26/2022 FINDINGS: The patient is rotated to the right. Normal lung volumes. Patchy opacity in the lateral  right upper. Diffuse bilateral reticular opacities. No pleural effusion or pneumothorax. The heart size and mediastinal contours are within normal limits. Reverse total right shoulder replacement. IMPRESSION: Patchy opacity in the lateral right upper lobe may reflect a focus of pulmonary contusion in the setting of trauma. Recommend follow-up chest x-ray in 4-6 weeks to ensure resolution. Electronically Signed   By: Darrin Nipper M.D.   On: 09/07/2022 13:13    Microbiology: Results for orders placed or performed during the hospital encounter of 03/27/22  Surgical pcr screen     Status: None   Collection Time: 03/11/22 12:15 PM   Specimen: Nasal Mucosa; Nasal Swab  Result Value Ref Range Status   MRSA, PCR NEGATIVE NEGATIVE Final   Staphylococcus aureus NEGATIVE NEGATIVE Final    Comment: (NOTE) The Xpert SA Assay (FDA approved for NASAL specimens in patients 60 years of age and older), is one component of a comprehensive surveillance program. It is not intended to diagnose infection nor to guide or monitor treatment. Performed at [facility], [place] [address], [place], [place] [address]    Labs: CBC: Recent Labs  Lab 09/07/22 1247 09/08/22 0420 09/09/22 0422  WBC 7.4 5.0 6.7  NEUTROABS  --  2.5  --   HGB 14.4 12.1* 13.6  HCT 40.7 35.1* 39.1  MCV 91.1 92.4 92.0  PLT 265 185 244   Basic Metabolic Panel: Recent Labs  Lab 09/07/22 1247 09/08/22 0420 09/09/22 0422  NA 138 138 136  K 3.9 3.0* 3.6  CL 101 101 102  CO2 '29 28 26  '$ GLUCOSE 110* 85 88  BUN 6* 6* 5*  CREATININE 0.91 0.73 0.69  CALCIUM 9.2 8.4* 9.0  MG  --  1.8 1.7   Liver Function Tests: Recent Labs  Lab 09/08/22 0420  AST 14*  ALT 11  ALKPHOS 84  BILITOT 0.5  PROT 5.2*  ALBUMIN 2.9*   CBG: Recent Labs  Lab 09/08/22 0702  GLUCAP 89    Discharge time spent: greater than 30 minutes.  Signed: Berle Mull, MD [facility] 09/09/2022

## 2022-09-10 NOTE — Telephone Encounter (Signed)
Transition Care Management Unsuccessful Follow-up Telephone Call  Date of discharge and from where:  [facility] 09/09/2022  Attempts:  1st Attempt  Reason for unsuccessful TCM follow-up call:  Unable to leave message, voice not set up Juanda Crumble, [place] Direct Dial [phone]

## 2022-09-11 ENCOUNTER — Other Ambulatory Visit: Payer: Self-pay

## 2022-09-11 ENCOUNTER — Telehealth: Payer: Self-pay | Admitting: Family

## 2022-09-11 ENCOUNTER — Encounter: Payer: Self-pay | Admitting: Family

## 2022-09-11 ENCOUNTER — Ambulatory Visit (INDEPENDENT_AMBULATORY_CARE_PROVIDER_SITE_OTHER): Payer: Medicare HMO | Admitting: Family

## 2022-09-11 VITALS — BP 112/81 | HR 94 | Temp 97.7°F | Ht 72.0 in | Wt 146.2 lb

## 2022-09-11 DIAGNOSIS — S2241XA Multiple fractures of ribs, right side, initial encounter for closed fracture: Secondary | ICD-10-CM

## 2022-09-11 DIAGNOSIS — R55 Syncope and collapse: Secondary | ICD-10-CM | POA: Diagnosis not present

## 2022-09-11 DIAGNOSIS — R5381 Other malaise: Secondary | ICD-10-CM

## 2022-09-11 DIAGNOSIS — R569 Unspecified convulsions: Secondary | ICD-10-CM

## 2022-09-11 DIAGNOSIS — R001 Bradycardia, unspecified: Secondary | ICD-10-CM | POA: Diagnosis not present

## 2022-09-11 LAB — GLUCOSE, CAPILLARY: Glucose-Capillary: 104 mg/dL — ABNORMAL HIGH (ref 70–99)

## 2022-09-11 NOTE — Progress Notes (Signed)
Patient ID: Spencer Mendoza, male    DOB: 08-22-1961, 61 y.o.   MRN: [id number]  Chief Complaint  Patient presents with   Hospitalization Follow-up    Rehab referral    HPI: Hospital follow/up - Multiple fractures:  first fell in June and fx 5 vertebrae and posterior rib fx, seen by neurosurgeon at [facility], this time he had a syncopal episode and fell going from one room to another and hit the doorjam and then fell to the floor. Syncope due to bradycardia and started on Midodrine, wearing a monitor which is to be mailed back in 2 weeks, & has f/u w/Cardiology end of Nov. Main concern today is pt is unable to care for himself at home, he was hasty in wanting to leave hospital and now realizes he should have gone to rehab. Living with sister who is present today but she works FT & takes care of 2 grandchildren and unable to give him help, they are requesting to see if he can still be admitted to rehab in hospital or SNF.  Assessment & Plan:   Problem List Items Addressed This Visit       Cardiovascular and Mediastinum   Sinus bradycardia    New Syncopal episode w/fall, hospital stay for 2 days, d/c 10/24 wearing heart monitor for 2 weeks taking Midodrine 2.'5mg'$  bid, tolerating f/u cardiology appt end of Nov        Musculoskeletal and Integument   Multiple rib fractures - Primary    anterior, right side, syncopal episode w/fall was recovering from bad fall in June w/vertebra fx & posterior rib fx taking OXY  pt sister present and states he should have gone to rehab, he is unable to anything for himself and she is unable to care for him requesting he go to rehab or SNF      Relevant Orders   AMB Referral to [place] Management     Other   Syncope    New caused a fall with new rib fractures hospital for 2 days, believed r/t bradycardia, possibly other arrythmia, wearing monitor for  2 weeks has f/u with Cardio end of Nov.      Other Visit Diagnoses     Physical  deconditioning    - pt sister present and states he should have gone to rehab, he is unable to anything for himself and she is unable to care for him;  requesting he go to rehab or SNF as was suggested at d/c, but pt had refused, advised both that it is very hard to get admitted after d/c, but will refer to [facility] and see if anything can be done.    Relevant Orders   AMB Referral to [facility] Care Management      Subjective:    Outpatient Medications Prior to Visit  Medication Sig Dispense Refill   acetaminophen (TYLENOL) 500 MG tablet Take 1,000 mg by mouth 2 (two) times daily as needed (pain).     acidophilus (RISAQUAD) CAPS capsule Take 1 capsule by mouth daily.     albuterol (VENTOLIN HFA) 108 (90 Base) MCG/ACT inhaler INHALE 2 PUFFS BY MOUTH INTO THE LUNGS EVERY 4 HOURS AS NEEDED FOR SHORTNESS OF BREATH OR WHEEZING (Patient taking differently: Inhale 2 puffs into the lungs as needed for wheezing or shortness of breath.) 8.5 g 5   benzonatate (TESSALON) 100 MG capsule Take 1 capsule (100 mg total) by mouth 3 (three) times daily. 20 capsule 0   budesonide-formoterol (SYMBICORT) 160-4.5  MCG/ACT inhaler Inhale 2 puffs into the lungs 2 (two) times daily. 10.2 g 5   celecoxib (CELEBREX) 200 MG capsule Take 1 capsule (200 mg total) by mouth 2 (two) times daily for 7 days. 14 capsule 0   levETIRAcetam (KEPPRA) 1000 MG tablet Take 1 tablet (1,000 mg total) by mouth 2 (two) times daily. 20 tablet 0   midodrine (PROAMATINE) 2.5 MG tablet Take 1 tablet (2.5 mg total) by mouth 2 (two) times daily with a meal. 30 tablet 0   nicotine (NICODERM CQ - DOSED IN MG/24 HOURS) 21 mg/24hr patch Place 1 patch (21 mg total) onto the skin daily. 28 patch 0   oxyCODONE-acetaminophen (PERCOCET) 5-325 MG tablet Take 1 tablet by mouth every 4 (four) hours as needed for severe pain. 20 tablet 0   rosuvastatin (CRESTOR) 10 MG tablet Take 1 tablet (10 mg total) by mouth daily. 90 tablet 3   traZODone (DESYREL) 100 MG tablet TAKE  1 TO 2 TABLETS(100 TO 200 MG) BY MOUTH AT BEDTIME 60 tablet 2   No facility-administered medications prior to visit.   Past Medical History:  Diagnosis Date   Abdominal pain 08/30/2019   Alcohol withdrawal ([place]) 07/29/2013   Allergy    Anxiety    Arthritis    Benzodiazepine abuse ([place]) 08/30/2019   Bipolar disorder ([place])    CAD (coronary artery disease)    Chronic insomnia 06/06/2015   Chronic migraine without aura, with intractable migraine, so stated, with status migrainosus 08/25/2019   Claustrophobia    Cocaine abuse ([place]) 05/22/2015   COPD (chronic obstructive pulmonary disease) ([place])    Delirium tremens ([place]) 07/29/2013   Epilepsy ([place])    last seizure week of 06-25-2020- sev. grand mal seizures per pt    Hemiplegic migraine with status migrainosus 06/06/2015   Migraine    Opioid use disorder 08/30/2019   PONV (postoperative nausea and vomiting)    Stroke [facility])    patient denies- states had a hemiplegic migraine in ~2011   Substance abuse ([place])    Past Surgical History:  Procedure Laterality Date   ANKLE SURGERY     in high school   APPENDECTOMY     COLONOSCOPY     KNEE ARTHROSCOPY Right    NOSE SURGERY     REVERSE SHOULDER ARTHROPLASTY Right 03/27/2022   Procedure: REVERSE SHOULDER ARTHROPLASTY;  Surgeon: Hiram Gash, MD;  Location: [place];  Service: Orthopedics;  Laterality: Right;   SHOULDER ARTHROSCOPY WITH DISTAL CLAVICLE RESECTION Right 12/04/2021   Procedure: SHOULDER ARTHROSCOPY WITH DISTAL CLAVICLE EXCISION;  Surgeon: Hiram Gash, MD;  Location: [place];  Service: Orthopedics;  Laterality: Right;   SHOULDER ARTHROSCOPY WITH SUBACROMIAL DECOMPRESSION, ROTATOR CUFF REPAIR AND BICEP TENDON REPAIR Right 12/04/2021   Procedure: SHOULDER ARTHROSCOPY WITH SUBACROMIAL DECOMPRESSION, ROTATOR CUFF REPAIR AND BICEP TENDON REPAIR;  Surgeon: Hiram Gash, MD;  Location: [place];  Service: Orthopedics;  Laterality:  Right;   Allergies  Allergen Reactions   Sertraline Other (See Comments)    Erectile dysfunction   Codeine Nausea And Vomiting   Depakote [Divalproex Sodium] Other (See Comments)    Made patient shake   Migranal [Dihydroergotamine] Nausea And Vomiting   Topamax [Topiramate] Other (See Comments)    Made patient angry      Objective:    Physical Exam Vitals and nursing note reviewed.  Constitutional:      General: He is not in acute distress.    Appearance:  Normal appearance. He is ill-appearing.  HENT:     Head: Normocephalic.     Right Ear: Decreased hearing noted.     Left Ear: Decreased hearing noted.  Cardiovascular:     Rate and Rhythm: Normal rate and regular rhythm.  Pulmonary:     Effort: Pulmonary effort is normal.     Breath sounds: Normal breath sounds.  Musculoskeletal:     Cervical back: Normal range of motion.     Thoracic back: Swelling and bony tenderness present. Decreased range of motion.     Lumbar back: Bony tenderness present. Decreased range of motion.  Skin:    General: Skin is warm and dry.  Neurological:     Mental Status: He is alert and oriented to person, place, and time.     Sensory: Sensation is intact.     Motor: Weakness present.     Coordination: Coordination abnormal.     Gait: Gait abnormal.  Psychiatric:        Mood and Affect: Mood normal.    BP 112/81 (BP Location: Left Arm, Patient Position: Sitting, Cuff Size: Large)   Pulse 94   Temp 97.7 F (36.5 C) (Temporal)   Ht 6' (1.829 m)   Wt 146 lb 4 oz (66.3 kg)   SpO2 96%   BMI 19.84 kg/m  Wt Readings from Last 3 Encounters:  09/11/22 146 lb 4 oz (66.3 kg)  09/09/22 148 lb 9.4 oz (67.4 kg)  07/02/22 145 lb (65.8 kg)       Jeanie Sewer, NP

## 2022-09-11 NOTE — Assessment & Plan Note (Signed)
   New  caused a fall with new rib fractures  hospital for 2 days, believed r/t bradycardia, possibly other arrythmia, wearing monitor for  2 weeks  has f/u with Cardio end of Nov.

## 2022-09-11 NOTE — Telephone Encounter (Signed)
Caller states: -pt was seen today (10/26) by pcp team. -forgot to tell provider that patient's left ear is clogged and he can't hear out of it. -patient has nebulizer machine at home, no medication, hose or mask. -She thinks he needs breathing treatments but is unsure if it is compatible with heart trouble. -she is available for any questions at her cell phone.   Caller requests: -antibiotics for ear -medication, hose and mask for nebulizer.   Preferred pharmacy: [facility] on [address] in [place]

## 2022-09-11 NOTE — Assessment & Plan Note (Signed)
   New  Syncopal episode w/fall, hospital stay for 2 days, d/c 10/24  wearing heart monitor for 2 weeks  taking Midodrine 2.'5mg'$  bid, tolerating  f/u cardiology appt end of Nov

## 2022-09-11 NOTE — Assessment & Plan Note (Addendum)
   anterior, right side, syncopal episode w/fall  was recovering from bad fall in June w/vertebra fx & posterior rib fx  taking OXY   pt sister present and states he should have gone to rehab, he is unable to anything for himself and she is unable to care for him  requesting he go to rehab or SNF

## 2022-09-12 ENCOUNTER — Telehealth: Payer: Self-pay | Admitting: *Deleted

## 2022-09-12 ENCOUNTER — Telehealth: Payer: Self-pay

## 2022-09-12 NOTE — Chronic Care Management (AMB) (Signed)
  Care Coordination   Note   09/12/2022 Name: Spencer Mendoza MRN: [id number] DOB: [birth year]  Spencer Mendoza is a 61 y.o. year old male who sees Jeanie Sewer, NP for primary care. I reached out to [place] Callas by phone today to offer care coordination services.  Received referral   Spencer Mendoza was given information about Care Coordination services today including:   The Care Coordination services include support from the care team which includes your Nurse Coordinator, Clinical Social Worker, or Pharmacist.  The Care Coordination team is here to help remove barriers to the health concerns and goals most important to you. Care Coordination services are voluntary, and the patient may decline or stop services at any time by request to their care team member.   Care Coordination Consent Status: Patient agreed to services and verbal consent obtained.   Follow up plan:  Telephone appointment with care coordination team member scheduled for:  09/16/2022  Encounter Outcome:  Pt. Scheduled from referral   Julian Hy, [place] Direct Dial: [phone]

## 2022-09-12 NOTE — Telephone Encounter (Signed)
LVM in regards

## 2022-09-12 NOTE — Telephone Encounter (Signed)
I really need to look in his ear to see what is going on, before prescribing an antibiotic as it could just be impacted and need flushing. When did he get the nebulizer machine? I don't this in his chart.  Is he using the Symbicort and Abuterol inhalers? He should be using these - the Symbicort is 2 puffs twice a day, EVERY day and the albuterol is as needed. let me know, thx

## 2022-09-12 NOTE — Telephone Encounter (Signed)
I called pt and pt stated he used ear drops so his ears does not hurt anymore but still having sinus pressure. Pt states he uses the Albuterol inhaler as needed and Symbicort twice a day. Pt does uses MyChart.

## 2022-09-16 ENCOUNTER — Ambulatory Visit: Payer: Self-pay

## 2022-09-16 NOTE — Patient Instructions (Signed)
Visit Information  Thank you for taking time to visit with me today. Please don't hesitate to contact me if I can be of assistance to you.   Following are the goals we discussed today:   Goals Addressed             This Visit's Progress    COMPLETED: Care Coordination Activities       Care Coordination Interventions: Received referral to assist with placement into short-term rehab Determined the patient no longer feels rehab is appropriate as he is doing somewhat better at home Education provided on when rehab is utilized and the difference between inpatient rehab and home health PT Patient endorses he is active with home health PT and feels it is sufficient at this time Discussed the patient continues to cough which wakes him at night. Patient reports mucus is green Determined the patient is also active with home health RN Scheduled patient to speak with RN Care Manager Raina Mina with care coordination team on 11/2 at 2:00 pm Collaboration with patients PCP to advise of patients continued cough as well as patients  plan to remain at home with home health PT at this time         Your next appointment is by telephone on 11/2 at 2:00 pm with RN Care Manager  Please call the care guide team at [phone] if you need to cancel or reschedule your appointment.   If you are experiencing a Mental Health or [place] or need someone to talk to, please call 1-800-273-TALK (toll free, 24 hour hotline)  Patient verbalizes understanding of instructions and care plan provided today and agrees to view in [place]. Active MyChart status and patient understanding of how to access instructions and care plan via MyChart confirmed with patient.     Telephone follow up appointment with care management team member scheduled for:11.2/ Please contact your primary care provider as needed.   Daneen Schick, BSW, CDP Social Worker, Certified Dementia Practitioner [place] Management   Care Coordination [phone]

## 2022-09-16 NOTE — Patient Outreach (Signed)
  Care Coordination   Initial Visit Note   09/16/2022 Name: Spencer Mendoza MRN: [id number] DOB: 1961/07/31  Spencer Mendoza is a 61 y.o. year old male who sees Jeanie Sewer, NP for primary care. I spoke with  [place] Callas by phone today.  What matters to the patients health and wellness today?  I can't stop coughing    Goals Addressed             This Visit's Progress    COMPLETED: Care Coordination Activities       Care Coordination Interventions: Received referral to assist with placement into short-term rehab Determined the patient no longer feels rehab is appropriate as he is doing somewhat better at home Education provided on when rehab is utilized and the difference between inpatient rehab and home health PT Patient endorses he is active with home health PT and feels it is sufficient at this time Discussed the patient continues to cough which wakes him at night. Patient reports mucus is green Determined the patient is also active with home health RN Scheduled patient to speak with RN Care Manager Raina Mina with care coordination team on 11/2 at 2:00 pm Collaboration with patients PCP to advise of patients continued cough as well as patients  plan to remain at home with home health PT at this time         SDOH assessments and interventions completed:  Yes  SDOH Interventions Today    Flowsheet Row Most Recent Value  SDOH Interventions   Housing Interventions Intervention Not Indicated  Transportation Interventions Intervention Not Indicated        Care Coordination Interventions Activated:  Yes  Care Coordination Interventions:  Yes, provided   Follow up plan: Follow up call scheduled for 11/2 at 2:00 pm with [place]    Encounter Outcome:  Pt. Visit Completed   Daneen Schick, [place], CDP Social Worker, Certified Dementia Practitioner [facility] Care Management  Care Coordination [phone]

## 2022-09-18 ENCOUNTER — Telehealth: Payer: Self-pay | Admitting: *Deleted

## 2022-09-18 ENCOUNTER — Ambulatory Visit: Payer: Self-pay | Admitting: *Deleted

## 2022-09-18 NOTE — Patient Outreach (Signed)
  Care Coordination   09/18/2022 Name: Spencer Mendoza MRN: [id number] DOB: January 26, 1961   Care Coordination Outreach Attempts:  A second unsuccessful outreach was attempted today to offer the patient with information about available care coordination services as a benefit of their health plan.     Follow Up Plan:  Additional outreach attempts will be made to offer the patient care coordination information and services.   Encounter Outcome:  No Answer  Care Coordination Interventions Activated:  No   Care Coordination Interventions:  No, not indicated    Raina Mina, RN Care Management Coordinator [place] Office [phone]

## 2022-09-18 NOTE — Patient Outreach (Signed)
  Care Coordination   09/18/2022 Name: HILL MACKIE MRN: [id number] DOB: 1960/12/22   Care Coordination Outreach Attempts:  An unsuccessful telephone outreach was attempted for a scheduled appointment today.  Follow Up Plan:  Additional outreach attempts will be made to offer the patient care coordination information and services.   Encounter Outcome:  No Answer  Care Coordination Interventions Activated:  No   Care Coordination Interventions:  No, not indicated    Raina Mina, RN Care Management Coordinator [place] Office [phone]

## 2022-09-20 DIAGNOSIS — Z743 Need for continuous supervision: Secondary | ICD-10-CM | POA: Diagnosis not present

## 2022-09-20 DIAGNOSIS — S4981XA Other specified injuries of right shoulder and upper arm, initial encounter: Secondary | ICD-10-CM | POA: Diagnosis not present

## 2022-09-21 ENCOUNTER — Other Ambulatory Visit: Payer: Self-pay | Admitting: Family

## 2022-09-21 DIAGNOSIS — F5104 Psychophysiologic insomnia: Secondary | ICD-10-CM

## 2022-09-21 DIAGNOSIS — Z96611 Presence of right artificial shoulder joint: Secondary | ICD-10-CM | POA: Diagnosis not present

## 2022-09-21 DIAGNOSIS — I6523 Occlusion and stenosis of bilateral carotid arteries: Secondary | ICD-10-CM | POA: Diagnosis not present

## 2022-09-21 DIAGNOSIS — R569 Unspecified convulsions: Secondary | ICD-10-CM | POA: Diagnosis not present

## 2022-09-21 DIAGNOSIS — E876 Hypokalemia: Secondary | ICD-10-CM | POA: Diagnosis not present

## 2022-09-21 DIAGNOSIS — E860 Dehydration: Secondary | ICD-10-CM | POA: Diagnosis not present

## 2022-09-21 DIAGNOSIS — I700 Atherosclerosis of aorta: Secondary | ICD-10-CM | POA: Diagnosis not present

## 2022-09-21 DIAGNOSIS — R079 Chest pain, unspecified: Secondary | ICD-10-CM | POA: Diagnosis not present

## 2022-09-21 DIAGNOSIS — Z79899 Other long term (current) drug therapy: Secondary | ICD-10-CM | POA: Diagnosis not present

## 2022-09-21 DIAGNOSIS — R410 Disorientation, unspecified: Secondary | ICD-10-CM | POA: Diagnosis not present

## 2022-09-21 DIAGNOSIS — J439 Emphysema, unspecified: Secondary | ICD-10-CM | POA: Diagnosis not present

## 2022-09-21 DIAGNOSIS — R4182 Altered mental status, unspecified: Secondary | ICD-10-CM | POA: Diagnosis not present

## 2022-09-21 DIAGNOSIS — G40909 Epilepsy, unspecified, not intractable, without status epilepticus: Secondary | ICD-10-CM | POA: Diagnosis not present

## 2022-09-21 DIAGNOSIS — F10188 Alcohol abuse with other alcohol-induced disorder: Secondary | ICD-10-CM | POA: Diagnosis not present

## 2022-09-21 DIAGNOSIS — F1010 Alcohol abuse, uncomplicated: Secondary | ICD-10-CM | POA: Diagnosis not present

## 2022-09-21 DIAGNOSIS — S2249XA Multiple fractures of ribs, unspecified side, initial encounter for closed fracture: Secondary | ICD-10-CM | POA: Diagnosis not present

## 2022-09-22 ENCOUNTER — Telehealth: Payer: Self-pay | Admitting: *Deleted

## 2022-09-22 NOTE — Progress Notes (Signed)
  Care Coordination Note  09/22/2022 Name: Spencer Mendoza MRN: [id number] DOB: 1961-10-22  Spencer Mendoza is a 61 y.o. year old male who is a primary care patient of Hudnell, [name], NP and is actively engaged with the care management team. I reached out to [place] Callas by phone today to assist with re-scheduling an initial visit with the RN Case Manager  Follow up plan: Telephone appointment with care management team member scheduled for: 09/23/2022  Julian Hy, [place] Direct Dial: [phone]

## 2022-09-23 ENCOUNTER — Telehealth: Payer: Self-pay | Admitting: Family

## 2022-09-23 ENCOUNTER — Encounter: Payer: Self-pay | Admitting: *Deleted

## 2022-09-23 ENCOUNTER — Ambulatory Visit: Payer: Self-pay | Admitting: *Deleted

## 2022-09-23 NOTE — Telephone Encounter (Signed)
[name] with [facility]  states Patient is requesting discharge from [facility] and PT until Patient sees PCP.  [name] states they will honor his request.

## 2022-09-23 NOTE — Patient Outreach (Signed)
  Care Coordination   Initial Visit Note   09/23/2022 Name: Spencer Mendoza MRN: [id number] DOB: 1961/05/12  Spencer Mendoza is a 61 y.o. year old male who sees Jeanie Sewer, NP for primary care. I spoke with  [place] Callas by phone today.  What matters to the patients health and wellness today?  Seizures-blackouts-falls awaiting results from his heart monitor.    Goals Addressed               This Visit's Progress     hx seizure leads to blackouts that result in falls (pt-stated)        Care Coordination Interventions: Provided written and verbal education re: potential causes of falls and Fall prevention strategies Reviewed medications and discussed potential side effects of medications such as dizziness and frequent urination Advised patient of importance of notifying provider of falls Assessed patients knowledge of fall risk prevention secondary to previously provided education Screening for signs and symptoms of depression related to chronic disease state  Assessed social determinant of health barriers Educated pt on making his home safety to prevent injuries with any falls from his "black-outs/seizures". Verified pt has a good support system with his two sisters.  Verified pt does not drive and aware when to contact his neurologist with any abnormal symptoms that continue to persist.  Pt reports turning in his recent heart monitor to examine for possible cardiac issues related to his ongoing seizure activity. Pt is awaiting results and aware to contact his provider office for further details on the results. Pt opt to decline any case management services indicating "things have improved".        SDOH assessments and interventions completed:  Yes  SDOH Interventions Today    Flowsheet Row Most Recent Value  SDOH Interventions   Food Insecurity Interventions Intervention Not Indicated  Housing Interventions Intervention Not Indicated  Transportation  Interventions Intervention Not Indicated  Utilities Interventions Intervention Not Indicated        Care Coordination Interventions Activated:  Yes  Care Coordination Interventions:  Yes, provided   Follow up plan: No further intervention required.   Encounter Outcome:  Pt. Visit Completed   Raina Mina, RN Care Management Coordinator [place] Office [phone]

## 2022-09-23 NOTE — Patient Instructions (Signed)
Visit Information  Thank you for taking time to visit with me today. Please don't hesitate to contact me if I can be of assistance to you.   Following are the goals we discussed today:   Goals Addressed               This Visit's Progress     COMPLETED: hx seizure leads to blackouts that result in falls (pt-stated)        Care Coordination Interventions: Provided written and verbal education re: potential causes of falls and Fall prevention strategies Reviewed medications and discussed potential side effects of medications such as dizziness and frequent urination Advised patient of importance of notifying provider of falls Assessed patients knowledge of fall risk prevention secondary to previously provided education Screening for signs and symptoms of depression related to chronic disease state  Assessed social determinant of health barriers Educated pt on making his home safety to prevent injuries with any falls from his "black-outs/seizures". Verified pt has a good support system with his two sisters.  Verified pt does not drive and aware when to contact his neurologist with any abnormal symptoms that continue to persist.  Pt reports turning in his recent heart monitor to examine for possible cardiac issues related to his ongoing seizure activity. Pt is awaiting results and aware to contact his provider office for further details on the results. Pt opt to decline any case management services indicating "things have improved".        Please call the care guide team at [phone] if you need to cancel or reschedule your appointment.   If you are experiencing a Mental Health or [place] or need someone to talk to, please call the Suicide and Crisis Lifeline: 988  Patient verbalizes understanding of instructions and care plan provided today and agrees to view in [place]. Active MyChart status and patient understanding of how to access instructions and care plan via  MyChart confirmed with patient.     No further follow up required: No further needs  Raina Mina, RN Care Management Coordinator [place] Office [phone]

## 2022-09-23 NOTE — Telephone Encounter (Signed)
Please call and ask why he no longer wants to see the PT. Is he doing better?

## 2022-09-24 NOTE — Telephone Encounter (Signed)
Pt states he had a seizure Saturday night and fell, He was Hospitalized in [place] and discharged Sunday. Pt states he fell on his bad shoulder and not able to do PT. PT stated there is nothing they can do as far as his shoulder now. Pt states the only thing Home health does is check his BP and his heart. After he finds out about his heart with cardiologist due to BP keeps dropping and low pulse he will see Ortho. Let pt know to keep [place] updated.

## 2022-09-25 ENCOUNTER — Telehealth: Payer: Self-pay | Admitting: Family

## 2022-09-25 NOTE — Telephone Encounter (Signed)
.  Home Health Certification or Plan of Care Tracking  Is this a Certification or Plan of Care? yes  [facility] Agency: [employer]  Order Number:  [id number]  Has charge sheet been attached? yes  Where has form been placed:  In provider's box  Faxed to:   [phone]

## 2022-09-30 NOTE — Telephone Encounter (Signed)
Forms placed on Hudnell's desk

## 2022-10-13 ENCOUNTER — Encounter: Payer: Self-pay | Admitting: Physician Assistant

## 2022-10-13 NOTE — Progress Notes (Unsigned)
Cardiology Office Note    Date:  10/15/2022   ID:  Spencer Mendoza, DOB 1961/05/13, MRN [id number]  PCP:  Jeanie Sewer, NP  Cardiologist:  Candee Furbish, MD  Electrophysiologist:  None   Chief Complaint: f/u syncope  History of Present Illness:   Spencer Mendoza is a 61 y.o. male with history of with nonobstructive CAD by coronary CT 03/2020, vasovagal syncope, PVD (stenosis of IMA, bilateral iliac arteries, and <50% distal aorta seen by [facility] in 2021), carotid artery disease (1-49% BICA by duplex 08/2022), sinus bradycardia/PSVT/PAT/junctional tach by monitor 2021, alcohol abuse, prior cocaine abuse, epilepsy, migraines, COPD, emphysema/possible pulmonary fibrosis by prior CT who is seen for post-hospital f/u.   [name] was previously evaluated by Dr. Marlou Porch in 2021 for unusual episodes of recurrent LOC. Cardiac testing did not reveal a cause at that time. 2D echo showed EF 50-55%, no RWMA, mild MR. Cor CT showed CAC 330 (94%ile) with moderate plaque in LAD and mild in the RCA, FFR negative. Zio showed NSR, rare junctional tach, occasional SVT/PAT but no pauses or afib. Lowest HR was 40bpm, average 56bpm c/w known sinus bradycardia. He does report he passed out several times while wearing the monitor. Syncope was felt vasovagal/noncardiac in etiology. At last OV 04/2022 he was reporting worsening thigh pain/weakness so LE ABIs were ordered and referred to Dr. Fletcher Anon but patient no-showed He was admitted to the hospital 08/2022 with multiple recurrent episodes of LOC, 4 out of the last 5 nights prior to admission with total of about 10 episodes in 6 months. He reported they primarily occurred when he goes to stand up to go to the bathroom at night. During the episode on Saturday he reports his sister told him he was out for 30-40 minutes. He urinated on himself with that event. During the episode before that he also had soiled himself with a BM. He denies any prodrome of symptoms except  occasionally will feel like his heart is "not beating quite right, slower-like." He had also lost about 40 lb unintentionally in the last year. He reported 2-3 cigarettes a day, 1 beer every other day, and denied illicit drug use aside from marijuana. Workup included CXR with patchy opacity ?pulm contusion. CT chest showed rib fx, aortic + coronary atherosclerosis, emphysematous changes, no PE. CT head nonacute. Labs notable for neg trop x 2, BNP 66, Hgb 14.4->12.1, K 3.9->3.0, Mg 1.8, albumin 2.9, ETOH neg, TSH wnl. Orthostatic VS were negative for drop in BP but did demonstrate rise in HR c/w orthostasis, and clinical history was felt c/w orthostatic hypotension. He had mild sinus bradycardia in 40s-50s. He was started on midodrine. Echo was reassuring, only mild LVH, otherwise normal LV/RV. He was advised no driving for 6 months. OP monitor showed average HR 68bpm, range 43-136, first degree AVB and rare PACs/PVCs. Of note he was also admitted to outside facility 09/21/2022 with delirium felt due to ETOH intoxication and left AMA from that visit.  He is seen for follow-up today with another person named Diane whom he states he's known for a long time. He is no longer taking his midodrine because he did not have any refills. He also reports he previously stopped his ASA and rosuvastatin at time of prior shoulder surgery because he was instructed to. He denies any recurrent LOC though does still periodically report dizziness upon standing. He did not have any resting dizziness. His main complaint is generalized body pain which is chronic for him,  including a component of chest discomfort when this is exacerbated lying in certain positions. He actually feels better when he does exertion, without any angina.    Labwork independently reviewed: OSH 09/2022 K 3.5, Cr 0.71, albumin 3.3, AST/ALT OK, UDS + THC, CBC wnl, troponin wnl, BNP wnl, ETOH elevated, Mg 1.7 08/2022 LDL 63   Cardiology Studies:   Studies  reviewed are outlined and summarized above. Reports included below if pertinent.   Monitor Prelim End 09/30/22 Patch Wear Time:  13 days and 23 hours (2023-10-24T08:42:39-0400 to 2023-11-07T07:34:58-0500)   Patient had a min HR of 43 bpm, max HR of 136 bpm, and avg HR of 68 bpm. Predominant underlying rhythm was Sinus Rhythm. First Degree AV Block was present. Isolated SVEs were rare (<1.0%), SVE Couplets were rare (<1.0%), and no SVE Triplets were present.  Isolated VEs were rare (<1.0%), VE Couplets were rare (<1.0%), and no VE Triplets were present. Ventricular Bigeminy was present.  Carotid 09/08/22  IMPRESSION: 1. Mild (1-49%) stenosis proximal right internal carotid artery secondary to heterogenous atherosclerotic plaque. 2. Mild (1-49%) stenosis proximal left internal carotid artery secondary to heterogenous atherosclerotic plaque. 3. Vertebral arteries are patent with normal antegrade flow.  Echo 09/08/22    1. Left ventricular ejection fraction, by estimation, is 60 to 65%. The  left ventricle has normal function. The left ventricle has no regional  wall motion abnormalities. There is mild left ventricular hypertrophy.  Left ventricular diastolic parameters  were normal.   2. Right ventricular systolic function is normal. The right ventricular  size is normal. There is normal pulmonary artery systolic pressure.   3. The mitral valve is normal in structure. No evidence of mitral valve  regurgitation. No evidence of mitral stenosis.   4. The aortic valve has an indeterminant number of cusps. Aortic valve  regurgitation is not visualized. No aortic stenosis is present.   5. The inferior vena cava is normal in size with greater than 50%  respiratory variability, suggesting right atrial pressure of 3 mmHg.    Cor CT 03/2020  IMPRESSION: 1.  Poor quality study due to motion.   2. Coronary calcium score of 330. This was 94th percentile for age and sex matched control.   2.   Normal coronary origin with right dominance.   3. Moderate plaque in the mid LAD with mild plaque in the RCA. Given the motion artifact this study is not a good candidate for FFRct.   4. Recommend aggressive risk factor modification including high potency statin. If symptoms persist, would have a low threshold to consider cardiac catheterization.   Skeet Latch, MD    Aorta:  Normal size.  No calcifications.  No dissection.   Aortic Valve:  Trileaflet.  No calcifications.   Coronary Arteries:  Normal coronary origin.  Right dominance.   RCA is a large dominant artery that gives rise to PDA and PLA. There is both proximal and mid calcified (mid mixed) plaque that is non obstructive (0-24%).   Left main is a large artery that gives rise to LAD and LCX arteries. There is a small focal calcified plaque, non flow limiting (0-24%).   LAD is a large vessel that has extensive prox to mid mixed calcified plaque that appears at points to be 50-69%, moderate in severity. Will send for CT FFR flow analysis.   LCX is a non-dominant artery that gives rise to one large OM1 branch. There is focal non flow limiting calcified plaque (0-24%). There is at  the proximal takeoff of first large OM soft plaque that appears moderate (50-69%).   Other findings:   Normal pulmonary vein drainage into the left atrium.   Normal left atrial appendage without a thrombus.   Normal size of the pulmonary artery.   IMPRESSION: 1. Coronary calcium score of 329. This was 71 percentile for age and sex matched control.   2. Normal coronary origin with right dominance.   3. Three vessel coronary mixed calcified non calcified plaque with moderate severity in proximal first OM branch and mid LAD. Sending for FFR analysis.   Candee Furbish, MD [facility]    Electronically Signed   By: Candee Furbish MD   On: 03/29/2020 16:44  Narrative & Impression  EXAM: FFRCT ANALYSIS   FINDINGS: FFRct analysis was  performed on the original cardiac CT angiogram dataset. Diagrammatic representation of the FFRct analysis is provided in a separate PDF document in PACS. This dictation was created using the PDF document and an interactive 3D model of the results. 3D model is not available in the EMR/PACS. Normal FFR range is >0.80.   1. Normal FFR through coronary arteries (lowest FFR 0.90 in distal segments).   IMPRESSION: Normal FFR. No evidence of flow limiting CAD. Continue with medical management.   Candee Furbish, MD [facility]   Note: These examples are not recommendations of [employer] and only provided as examples of what other customers are doing.     Electronically Signed   By: Candee Furbish MD   On: 03/30/2020 06:22      IMPRESSION: No acute extra cardiac abnormality.   Peripheral interstitial thickening which likely reflects early fibrosis.   Electronically Signed: By: Rolm Baptise M.D. On: 03/29/2020 15:42      Past Medical History:  Diagnosis Date   Abdominal pain 08/30/2019   Alcohol withdrawal ([place]) 07/29/2013   Allergy    Anxiety    Arthritis    Benzodiazepine abuse ([place]) 08/30/2019   Bipolar disorder ([place])    CAD (coronary artery disease)    Carotid arterial disease (HCC)    Chronic insomnia 06/06/2015   Chronic migraine without aura, with intractable migraine, so stated, with status migrainosus 08/25/2019   Claustrophobia    Cocaine abuse ([place]) 05/22/2015   COPD (chronic obstructive pulmonary disease) ([place])    Delirium tremens ([place]) 07/29/2013   Epilepsy ([place])    last seizure week of 06-25-2020- sev. grand mal seizures per pt    Hemiplegic migraine with status migrainosus 06/06/2015   Migraine    Opioid use disorder 08/30/2019   PAD (peripheral artery disease) (HCC)    PONV (postoperative nausea and vomiting)    Stroke [facility])    patient denies- states had a hemiplegic migraine in ~2011   Substance abuse ([place])     Past Surgical History:  Procedure Laterality  Date   ANKLE SURGERY     in high school   APPENDECTOMY     COLONOSCOPY     KNEE ARTHROSCOPY Right    NOSE SURGERY     REVERSE SHOULDER ARTHROPLASTY Right 03/27/2022   Procedure: REVERSE SHOULDER ARTHROPLASTY;  Surgeon: Hiram Gash, MD;  Location: [place];  Service: Orthopedics;  Laterality: Right;   SHOULDER ARTHROSCOPY WITH DISTAL CLAVICLE RESECTION Right 12/04/2021   Procedure: SHOULDER ARTHROSCOPY WITH DISTAL CLAVICLE EXCISION;  Surgeon: Hiram Gash, MD;  Location: [place];  Service: Orthopedics;  Laterality: Right;   SHOULDER ARTHROSCOPY WITH SUBACROMIAL DECOMPRESSION, ROTATOR CUFF REPAIR AND BICEP TENDON  REPAIR Right 12/04/2021   Procedure: SHOULDER ARTHROSCOPY WITH SUBACROMIAL DECOMPRESSION, ROTATOR CUFF REPAIR AND BICEP TENDON REPAIR;  Surgeon: Hiram Gash, MD;  Location: [place];  Service: Orthopedics;  Laterality: Right;    Current Medications: Current Meds  Medication Sig   acetaminophen (TYLENOL) 500 MG tablet Take 1,000 mg by mouth 2 (two) times daily as needed (pain).   acidophilus (RISAQUAD) CAPS capsule Take 1 capsule by mouth daily.   albuterol (VENTOLIN HFA) 108 (90 Base) MCG/ACT inhaler INHALE 2 PUFFS BY MOUTH INTO THE LUNGS EVERY 4 HOURS AS NEEDED FOR SHORTNESS OF BREATH OR WHEEZING   budesonide-formoterol (SYMBICORT) 160-4.5 MCG/ACT inhaler Inhale 2 puffs into the lungs 2 (two) times daily.   levETIRAcetam (KEPPRA) 1000 MG tablet Take 1 tablet (1,000 mg total) by mouth 2 (two) times daily.   traZODone (DESYREL) 100 MG tablet TAKE 1 TO 2 TABLETS(100 TO 200 MG) BY MOUTH AT BEDTIME      Allergies:   Sertraline, Codeine, Depakote [divalproex sodium], Migranal [dihydroergotamine], and Topamax [topiramate]   Social History   Socioeconomic History   Marital status: Legally Separated    Spouse name: Not on file   Number of children: 0   Years of education: GED   Highest education level: Not on file  Occupational  History   Occupation: disabled  Tobacco Use   Smoking status: Every Day    Packs/day: 0.25    Years: 15.00    Total pack years: 3.75    Types: Cigarettes   Smokeless tobacco: Never   Tobacco comments:    States he quit 02-25-22  Vaping Use   Vaping Use: Never used  Substance and Sexual Activity   Alcohol use: Yes    Alcohol/week: 6.0 standard drinks of alcohol    Types: 6 Cans of beer per week    Comment: one a night   Drug use: Yes    Types: Marijuana    Comment: daily   Sexual activity: Yes    Birth control/protection: Condom  Other Topics Concern   Not on file  Social History Narrative   Pt lives with sister.   Patient drinks 2 cups of caffeine daily.   Patient is right handed.   Social Determinants of Health   Financial Resource Strain: Low Risk  (08/01/2022)   Overall Financial Resource Strain (CARDIA)    Difficulty of Paying Living Expenses: Not hard at all  Food Insecurity: No Food Insecurity (09/23/2022)   Hunger Vital Sign    Worried About Running Out of Food in the Last Year: Never true    Ran Out of Food in the Last Year: Never true  Recent Concern: Food Insecurity - Food Insecurity Present (09/08/2022)   Hunger Vital Sign    Worried About Running Out of Food in the Last Year: Sometimes true    Ran Out of Food in the Last Year: Sometimes true  Transportation Needs: No Transportation Needs (09/23/2022)   PRAPARE - Hydrologist (Medical): No    Lack of Transportation (Non-Medical): No  Physical Activity: Inactive (08/01/2022)   Exercise Vital Sign    Days of Exercise per Week: 0 days    Minutes of Exercise per Session: 0 min  Stress: Stress Concern Present (08/01/2022)   [place]    Feeling of Stress : Rather much  Social Connections: Unknown (08/01/2022)   Social Connection and Isolation Panel [NHANES]    Frequency of  Communication with Friends and Family: More  than three times a week    Frequency of Social Gatherings with Friends and Family: More than three times a week    Attends Religious Services: More than 4 times per year    Active Member of [employer] or Organizations: Not on file    Attends Archivist Meetings: Not on file    Marital Status: Separated     Family History:  The patient's family history includes Cancer in his cousin and father; Colon cancer in his cousin; Colon cancer (age of onset: 22) in his sister; Epilepsy in his mother; Heart disease in his father and mother; Heart failure in his mother; Hypertension in his father; Melanoma in his father; Migraines in his father; Stroke in his mother. There is no history of Colon polyps, Esophageal cancer, Rectal cancer, or Stomach cancer.  ROS:   Please see the history of present illness.  All other systems are reviewed and otherwise negative.    EKG(s)/Additional Labs   EKG:  EKG is not ordered today  Recent Labs: 09/07/2022: B Natriuretic Peptide 66.0 09/08/2022: ALT 11; TSH 3.006 09/09/2022: BUN 5; Creatinine, Ser 0.69; Hemoglobin 13.6; Magnesium 1.7; Platelets 199; Potassium 3.6; Sodium 136  Recent Lipid Panel    Component Value Date/Time   CHOL 143 09/09/2022 0422   CHOL 163 07/13/2020 0921   TRIG 85 09/09/2022 0422   HDL 63 09/09/2022 0422   HDL 101 07/13/2020 0921   CHOLHDL 2.3 09/09/2022 0422   VLDL 17 09/09/2022 0422   LDLCALC 63 09/09/2022 0422   LDLCALC 50 07/13/2020 0921    PHYSICAL EXAM:    VS:  BP 116/70   Pulse 81   Ht 6' (1.829 m)   Wt 150 lb (68 kg)   SpO2 97%   BMI 20.34 kg/m   BMI: Body mass index is 20.34 kg/m.  GEN: Well nourished, well developed male in no acute distress HEENT: normocephalic, atraumatic Neck: no JVD, carotid bruits, or masses Cardiac: RRR; no murmurs, rubs, or gallops, no edema  Respiratory:  clear to auscultation bilaterally, normal work of breathing GI: soft, nontender, nondistended, + BS MS: no deformity or  atrophy Skin: warm and dry, no rash Neuro:  Alert and Oriented x 3, Strength and sensation are intact, follows commands Psych: euthymic mood, full affect  Wt Readings from Last 3 Encounters:  10/15/22 150 lb (68 kg)  09/11/22 146 lb 4 oz (66.3 kg)  09/09/22 148 lb 9.4 oz (67.4 kg)     ASSESSMENT & PLAN:   1. Syncope - this was primarily felt due to orthostasis in the hospital. He was sent home with midodrine rx and states he picked up but stopped taking after he ran out. He did not have any recurrent syncope and monitor was generally stable from 2021. There was one episode of sinus bradycardia during mid AM hours but does not seem that he was specifically symptomatic with this and he reports variable sleep times as he does not sleep well at night. I have recommended we restart midodrine and follow him up in 6 weeks to reassess BP on this medicine.  2. Mild-moderate CAD by CTA 2021 - he reports ASA previously discontinued around the time of his shoulder surgery, but not resumed. Looks like it fell off his list. His last CBC was normal and he has not had any unusual bleeding. Would recommend to resume low dose ASA - also counseled on reduction of harmful substances. He continues to  have some atypical CP periodically but this is in the context of MSK pains, reproducible with position changes, and denies any exertional angina. Would avoid BB in setting of cocaine use. We will resume rosuvastatin at '10mg'$  daily today. Will plan to f/u lipids when he comes back in for follow-up if he is adherent to regimen at that time.  3. PSVT, PAT, junctional tachy by monitor 2021 - arrhythmias were quiescent by repeat monitor, not an active issue at this time. He is not requiring any AVN nodal blocking agents - would avoid due to hx of sinus bradycardia.  4. PAD, carotid artery disease - carotid duplex in 08/2022 showed mild stable plaque. However, he is in need of overdue f/u with [facility] for his PAD. Encouraged he  reached out to them to schedule appointment.  5. Additional medical issues including weight loss, pulmonary fibrosis by CT 2021 not mentioned on 2023 CT - have strongly recommended PCP follow-up.     Disposition: F/u with me in 6 weeks.   Medication Adjustments/Labs and Tests Ordered: Current medicines are reviewed at length with the patient today.  Concerns regarding medicines are outlined above. Medication changes, Labs and Tests ordered today are summarized above and listed in the Patient Instructions accessible in Encounters.   Signed, Charlie Pitter, PA-C  10/15/2022 10:59 AM    [place] Phone: [phone]; Fax: [phone]

## 2022-10-15 ENCOUNTER — Encounter: Payer: Self-pay | Admitting: Physician Assistant

## 2022-10-15 ENCOUNTER — Ambulatory Visit: Payer: Medicare HMO | Attending: Physician Assistant | Admitting: Physician Assistant

## 2022-10-15 VITALS — BP 116/70 | HR 81 | Ht 72.0 in | Wt 150.0 lb

## 2022-10-15 DIAGNOSIS — I4719 Other supraventricular tachycardia: Secondary | ICD-10-CM

## 2022-10-15 DIAGNOSIS — I471 Supraventricular tachycardia, unspecified: Secondary | ICD-10-CM

## 2022-10-15 DIAGNOSIS — I2510 Atherosclerotic heart disease of native coronary artery without angina pectoris: Secondary | ICD-10-CM | POA: Diagnosis not present

## 2022-10-15 DIAGNOSIS — R55 Syncope and collapse: Secondary | ICD-10-CM | POA: Diagnosis not present

## 2022-10-15 DIAGNOSIS — I739 Peripheral vascular disease, unspecified: Secondary | ICD-10-CM | POA: Diagnosis not present

## 2022-10-15 DIAGNOSIS — R001 Bradycardia, unspecified: Secondary | ICD-10-CM | POA: Diagnosis not present

## 2022-10-15 MED ORDER — ASPIRIN 81 MG PO TBEC: 81.0000 mg | DELAYED_RELEASE_TABLET | Freq: Every day | ORAL | 3 refills | Status: AC

## 2022-10-15 MED ORDER — ROSUVASTATIN CALCIUM 10 MG PO TABS: 10.0000 mg | ORAL_TABLET | Freq: Every day | ORAL | 1 refills | Status: DC

## 2022-10-15 MED ORDER — MIDODRINE HCL 2.5 MG PO TABS: 2.5000 mg | ORAL_TABLET | Freq: Two times a day (BID) | ORAL | 1 refills | Status: DC

## 2022-10-15 NOTE — Patient Instructions (Addendum)
  Medication Instructions:  Your physician has recommended you make the following change in your medication:  Start aspirin 81 mg by mouth daily . Start midodrine 2.5 mg by mouth twice daily. Start Rosuvastatin 10 mg by mouth daily .  *If you need a refill on your cardiac medications before your next appointment, please call your pharmacy*   Lab Work: none If you have labs (blood work) drawn today and your tests are completely normal, you will receive your results only by: [place] (if you have MyChart) OR A paper copy in the mail If you have any lab test that is abnormal or we need to change your treatment, we will call you to review the results.   Testing/Procedures: none   Follow-Up: At [facility], you and your health needs are our priority.  As part of our continuing mission to provide you with exceptional heart care, we have created designated Provider Care Teams.  These Care Teams include your primary Cardiologist (physician) and Advanced Practice Providers (APPs -  Physician Assistants and Nurse Practitioners) who all work together to provide you with the care you need, when you need it.  We recommend signing up for the patient portal called "MyChart".  Sign up information is provided on this After Visit Summary.  MyChart is used to connect with patients for Virtual Visits (Telemedicine).  Patients are able to view lab/test results, encounter notes, upcoming appointments, etc.  Non-urgent messages can be sent to your provider as well.   To learn more about what you can do with MyChart, go to [email or web address].    Your next appointment:   12/02/22 at 10:30  The format for your next appointment:   In Person  Provider:   Melina Copa, PA-C         Other Instructions    Important Information About Sugar                   You are overdue for follow-up with the vein and vascular surgeon. Please call  [phone] for an appointment. It  is also important to follow up with your PCP as well.

## 2022-10-30 ENCOUNTER — Encounter: Payer: Self-pay | Admitting: *Deleted

## 2022-11-30 NOTE — Progress Notes (Deleted)
Cardiology Office Note    Date:  11/30/2022   ID:  Spencer Mendoza, DOB May 12, 1961, Spencer Mendoza  PCP:  Jeanie Sewer, NP  Cardiologist:  Candee Furbish, MD  Electrophysiologist:  None   Chief Complaint: ***  History of Present Illness:   Spencer Mendoza is a 62 y.o. male with history of  nonobstructive CAD by coronary CT 03/2020, vasovagal syncope, PVD (stenosis of IMA, bilateral iliac arteries, and <50% distal aorta seen by [facility] in 2021), carotid artery disease (1-49% BICA by duplex 08/2022), sinus bradycardia/PSVT/PAT/junctional tach not requiring specific intervention, alcohol abuse, prior cocaine abuse, epilepsy, migraines, COPD, emphysema/possible pulmonary fibrosis by prior CT who is seen for f/u.   Spencer Mendoza was previously evaluated by Dr. Marlou Porch in 2021 for unusual episodes of recurrent LOC. Cardiac testing did not reveal a cause at that time. 2D echo showed EF 50-55%, no RWMA, mild MR. Cor CT showed CAC 330 (94%ile) with moderate plaque in LAD and mild in the RCA, FFR negative. Zio showed NSR, rare junctional tach, occasional SVT/PAT but no pauses or afib. Lowest HR was 40bpm, average 56bpm c/w known sinus bradycardia. He does report he passed out several times while wearing the monitor. Syncope was felt vasovagal/noncardiac in etiology. At last OV 04/2022 he was reporting worsening thigh pain/weakness so LE ABIs were ordered and referred to Dr. Fletcher Anon but patient no-showed. He was admitted to the hospital 08/2022 with multiple recurrent episodes of LOC, 4 out of 5 nights prior to admission with total of about 10 episodes in 6 months. They primarily occurred upon standing up to go to the bathroom at night. During one episode he was reported by family to be out for 30-40 minutes. He urinated on himself with that event. During the episode before that, he also had soiled himself with a BM. He denied any prodrome of symptoms except occasionally would feel like his heart is "not  beating quite right, slower-like." CT chest showed rib fx, aortic + coronary atherosclerosis, emphysematous changes, no PE. CT head and MR brain nonacute. Labs notable for neg trop x 2, BNP 66, Hgb 14.4->12.1, K 3.9->3.0, Mg 1.8, albumin 2.9, ETOH neg, TSH wnl. Orthostatic VS were negative for drop in BP but did demonstrate rise in HR c/w orthostasis. Carotid duplex showed 1-49% BICA. He had mild sinus bradycardia in 40s-50s. He was started on midodrine. Echo was reassuring, only mild LVH, otherwise normal LV/RV. He was advised no driving for 6 months. F/u monitor showed average HR 68bpm, range 43-136, first degree AVB and rare PACs/PVCs. Of note he was also admitted to outside facility 09/21/2022 with delirium felt due to ETOH intoxication and left AMA from that visit.  He had also lost about 40 lb unintentionally in the last year. He reported 2-3 cigarettes a day, 1 beer every other day, and denied illicit drug use aside from marijuana. At f/u he was doing well though had run out of midodrine and rosuvastatin so stopped taking this. We restarted them with plan for close follow-up. I had also encouraged f/u PCP for his weight loss and prior pulm fibrosis mentioned on 2021 CT.  Restarted midodrine and rosuva  Syncope Mild-moderate CAD PSVT, PAT, junctional tach and sinus bradycardia PAD, carotid artery disease   Labwork independently reviewed: OSH 09/2022 K 3.5, Cr 0.71, albumin 3.3, AST/ALT OK, UDS + THC, CBC wnl, troponin wnl, BNP wnl, ETOH elevated, Mg 1.7 08/2022 LDL 63   Past History     Past Medical History:  Diagnosis Date   Abdominal pain 08/30/2019   Alcohol withdrawal ([place]) 07/29/2013   Allergy    Anxiety    Arthritis    Benzodiazepine abuse ([place]) 08/30/2019   Bipolar disorder ([place])    CAD (coronary artery disease)    Carotid arterial disease (HCC)    Chronic insomnia 06/06/2015   Chronic migraine without aura, with intractable migraine, so stated, with status migrainosus  08/25/2019   Claustrophobia    Cocaine abuse ([place]) 05/22/2015   COPD (chronic obstructive pulmonary disease) ([place])    Delirium tremens ([place]) 07/29/2013   Epilepsy ([place])    last seizure week of 06-25-2020- sev. grand mal seizures per pt    Hemiplegic migraine with status migrainosus 06/06/2015   Migraine    Opioid use disorder 08/30/2019   PAD (peripheral artery disease) (HCC)    PONV (postoperative nausea and vomiting)    Stroke [facility])    patient denies- states had a hemiplegic migraine in ~2011   Substance abuse ([place])     Past Surgical History:  Procedure Laterality Date   ANKLE SURGERY     in high school   APPENDECTOMY     COLONOSCOPY     KNEE ARTHROSCOPY Right    NOSE SURGERY     REVERSE SHOULDER ARTHROPLASTY Right 03/27/2022   Procedure: REVERSE SHOULDER ARTHROPLASTY;  Surgeon: Hiram Gash, MD;  Location: [facility];  Service: Orthopedics;  Laterality: Right;   SHOULDER ARTHROSCOPY WITH DISTAL CLAVICLE RESECTION Right 12/04/2021   Procedure: SHOULDER ARTHROSCOPY WITH DISTAL CLAVICLE EXCISION;  Surgeon: Hiram Gash, MD;  Location: [facility];  Service: Orthopedics;  Laterality: Right;   SHOULDER ARTHROSCOPY WITH SUBACROMIAL DECOMPRESSION, ROTATOR CUFF REPAIR AND BICEP TENDON REPAIR Right 12/04/2021   Procedure: SHOULDER ARTHROSCOPY WITH SUBACROMIAL DECOMPRESSION, ROTATOR CUFF REPAIR AND BICEP TENDON REPAIR;  Surgeon: Hiram Gash, MD;  Location: [facility];  Service: Orthopedics;  Laterality: Right;    Current Medications: No outpatient medications have been marked as taking for the 12/02/22 encounter (Appointment) with Charlie Pitter, PA-C.   ***   Allergies:   Sertraline, Codeine, Depakote [divalproex sodium], Migranal [dihydroergotamine], and Topamax [topiramate]   Social History   Socioeconomic History   Marital status: Legally Separated    Spouse name: Not on file   Number of children: 0   Years of education: GED    Highest education level: Not on file  Occupational History   Occupation: disabled  Tobacco Use   Smoking status: Every Day    Packs/day: 0.25    Years: 15.00    Total pack years: 3.75    Types: Cigarettes   Smokeless tobacco: Never   Tobacco comments:    States he quit 02-25-22  Vaping Use   Vaping Use: Never used  Substance and Sexual Activity   Alcohol use: Yes    Alcohol/week: 6.0 standard drinks of alcohol    Types: 6 Cans of beer per week    Comment: one a night   Drug use: Yes    Types: Marijuana    Comment: daily   Sexual activity: Yes    Birth control/protection: Condom  Other Topics Concern   Not on file  Social History Narrative   Pt lives with sister.   Patient drinks 2 cups of caffeine daily.   Patient is right handed.   Social Determinants of Health   Financial Resource Strain: Low Risk  (08/01/2022)   Overall Financial Resource Strain (CARDIA)  Difficulty of Paying Living Expenses: Not hard at all  Food Insecurity: No Food Insecurity (09/23/2022)   Hunger Vital Sign    Worried About Running Out of Food in the Last Year: Never true    Ran Out of Food in the Last Year: Never true  Recent Concern: Food Insecurity - Food Insecurity Present (09/08/2022)   Hunger Vital Sign    Worried About Running Out of Food in the Last Year: Sometimes true    Ran Out of Food in the Last Year: Sometimes true  Transportation Needs: No Transportation Needs (09/23/2022)   PRAPARE - Hydrologist (Medical): No    Lack of Transportation (Non-Medical): No  Physical Activity: Inactive (08/01/2022)   Exercise Vital Sign    Days of Exercise per Week: 0 days    Minutes of Exercise per Session: 0 min  Stress: Stress Concern Present (08/01/2022)   [place]    Feeling of Stress : Rather much  Social Connections: Unknown (08/01/2022)   Social Connection and Isolation Panel [NHANES]     Frequency of Communication with Friends and Family: More than three times a week    Frequency of Social Gatherings with Friends and Family: More than three times a week    Attends Religious Services: More than 4 times per year    Active Member of [employer] or Organizations: Not on file    Attends Archivist Meetings: Not on file    Marital Status: Separated     Family History:  The patient's ***family history includes Cancer in his cousin and father; Colon cancer in his cousin; Colon cancer (age of onset: 18) in his sister; Epilepsy in his mother; Heart disease in his father and mother; Heart failure in his mother; Hypertension in his father; Melanoma in his father; Migraines in his father; Stroke in his mother. There is no history of Colon polyps, Esophageal cancer, Rectal cancer, or Stomach cancer.  ROS:   Please see the history of present illness. Otherwise, review of systems is positive for ***.  All other systems are reviewed and otherwise negative.    EKG(s)/Additional Testing   EKG:  EKG is ordered today, personally reviewed, demonstrating ***  CV Studies: Cardiac studies reviewed are outlined and summarized above. Otherwise please see EMR for full report.  Recent Labs: 09/07/2022: B Natriuretic Peptide 66.0 09/08/2022: ALT 11; TSH 3.006 09/09/2022: BUN 5; Creatinine, Ser 0.69; Hemoglobin 13.6; Magnesium 1.7; Platelets 199; Potassium 3.6; Sodium 136  Recent Lipid Panel    Component Value Date/Time   CHOL 143 09/09/2022 0422   CHOL 163 07/13/2020 0921   TRIG 85 09/09/2022 0422   HDL 63 09/09/2022 0422   HDL 101 07/13/2020 0921   CHOLHDL 2.3 09/09/2022 0422   VLDL 17 09/09/2022 0422   LDLCALC 63 09/09/2022 0422   LDLCALC 50 07/13/2020 0921    PHYSICAL EXAM:    VS:  There were no vitals taken for this visit.  BMI: There is no height or weight on file to calculate BMI.  GEN: Well nourished, well developed male in no acute distress HEENT: normocephalic,  atraumatic Neck: no JVD, carotid bruits, or masses Cardiac: ***RRR; no murmurs, rubs, or gallops, no edema  Respiratory:  clear to auscultation bilaterally, normal work of breathing GI: soft, nontender, nondistended, + BS MS: no deformity or atrophy Skin: warm and dry, no rash Neuro:  Alert and Oriented x 3, Strength and sensation are intact,  follows commands Psych: euthymic mood, full affect  Wt Readings from Last 3 Encounters:  10/15/22 150 lb (68 kg)  09/11/22 146 lb 4 oz (66.3 kg)  09/09/22 148 lb 9.4 oz (67.4 kg)     ASSESSMENT & PLAN:   ***     Disposition: F/u with ***   Medication Adjustments/Labs and Tests Ordered: Current medicines are reviewed at length with the patient today.  Concerns regarding medicines are outlined above. Medication changes, Labs and Tests ordered today are summarized above and listed in the Patient Instructions accessible in Encounters.   Signed, Charlie Pitter, PA-C  11/30/2022 10:27 AM    [place] Phone: [phone]; Fax: [phone]

## 2022-12-02 ENCOUNTER — Ambulatory Visit: Payer: Medicare HMO | Attending: Physician Assistant | Admitting: Physician Assistant

## 2022-12-02 DIAGNOSIS — I6523 Occlusion and stenosis of bilateral carotid arteries: Secondary | ICD-10-CM

## 2022-12-02 DIAGNOSIS — R55 Syncope and collapse: Secondary | ICD-10-CM

## 2022-12-02 DIAGNOSIS — R001 Bradycardia, unspecified: Secondary | ICD-10-CM

## 2022-12-02 DIAGNOSIS — I2510 Atherosclerotic heart disease of native coronary artery without angina pectoris: Secondary | ICD-10-CM

## 2022-12-02 DIAGNOSIS — I739 Peripheral vascular disease, unspecified: Secondary | ICD-10-CM

## 2022-12-02 DIAGNOSIS — I471 Supraventricular tachycardia, unspecified: Secondary | ICD-10-CM

## 2022-12-11 ENCOUNTER — Ambulatory Visit (INDEPENDENT_AMBULATORY_CARE_PROVIDER_SITE_OTHER): Payer: Medicare HMO | Admitting: Family

## 2022-12-11 ENCOUNTER — Encounter: Payer: Self-pay | Admitting: Family

## 2022-12-11 VITALS — BP 123/84 | HR 74 | Temp 98.2°F | Ht 72.0 in | Wt 154.1 lb

## 2022-12-11 DIAGNOSIS — Z5989 Other problems related to housing and economic circumstances: Secondary | ICD-10-CM | POA: Diagnosis not present

## 2022-12-11 DIAGNOSIS — G8929 Other chronic pain: Secondary | ICD-10-CM

## 2022-12-11 DIAGNOSIS — F5104 Psychophysiologic insomnia: Secondary | ICD-10-CM

## 2022-12-11 DIAGNOSIS — M25511 Pain in right shoulder: Secondary | ICD-10-CM

## 2022-12-11 DIAGNOSIS — F319 Bipolar disorder, unspecified: Secondary | ICD-10-CM | POA: Diagnosis not present

## 2022-12-11 MED ORDER — TRAZODONE HCL 100 MG PO TABS: ORAL_TABLET | ORAL | 2 refills | Status: DC

## 2022-12-11 MED ORDER — DICLOFENAC SODIUM 75 MG PO TBEC: 75.0000 mg | DELAYED_RELEASE_TABLET | Freq: Two times a day (BID) | ORAL | 5 refills | Status: DC

## 2022-12-11 NOTE — Progress Notes (Addendum)
Patient ID: Spencer Mendoza, male    DOB: 14-Mar-1961, 62 y.o.   MRN: [id number]  Chief Complaint  Patient presents with   Pain    HPI: INSOMNIA:  How long: years. Difficulty initiating sleep: yes.  Difficulty maintaining sleep: yes.  OTC meds tried: no. RX meds in past: Remeron, Trazodone, Valium, Seroquel, Phenobarbitol. Sleep hygiene measures: yes. Started any new meds recently: no. Shift worker: no. New stressors: yes, his sister, Freda Munro died recently. Currently taking Trazodone with relief.  Chronic pain f/u:  pt reports he is still having a lot of pain in his right shoulder that he had surgery on last total joint replacement in 03/2022- but had accident last summer in which he reinjured shoulder. He states he is unable to use his right arm much at all. Imaging done in October and negative for fx, and pt has not followed up with Ortho again.  Pt also reports he is having to move out of his sister's home since she died per the "[place] not allowing him to live there? He is looking to rent a home or apartment, but most won't allow his dog, a lab mix, 70lbs, who is friendly, and is requesting a letter stating it is a medical necessity to have his dog live with him. He reports the dog can detect when he is getting ready to have a seizure, also provides emotional support as he suffers from long term depression which has been worsened by the recent death of his sister.  Assessment & Plan:   Problem List Items Addressed This Visit       Other   Chronic insomnia    Chronic, stable pt reports having problem since childhood tried many different medicines, all have interaction with his Keppra, except Trazodone he can take without side effects refilling Trazodone '100mg'$  qhs f/u in 3 mos.       Relevant Medications   traZODone (DESYREL) 100 MG tablet   Chronic right shoulder pain - Primary    total joint replacement in 03/2022 had accident last summer in which he reinjured  shoulder states he  unable to use his right arm pt advised to call his Ortho to f/u, but states he can't afford it sending Diclofenac sodium, '75mg'$  bid to help with pain, advised on use & SE f/u prn      Relevant Medications   traZODone (DESYREL) 100 MG tablet   diclofenac (VOLTAREN) 75 MG EC tablet   Other Visit Diagnoses     Living accommodation issues    - pt needing help finding a rental that will allow his lab mix, 70lb dog. Letter provided today stating medical necessity for the dog, but pt states he has to move out of his sister's home in [place] quickly & afraid he will be homeless. Pt has limited resources.    Relevant Orders   AMB Referral to [facility] Care Management   Subjective:    Outpatient Medications Prior to Visit  Medication Sig Dispense Refill   acetaminophen (TYLENOL) 500 MG tablet Take 1,000 mg by mouth 2 (two) times daily as needed (pain).     acidophilus (RISAQUAD) CAPS capsule Take 1 capsule by mouth daily.     albuterol (VENTOLIN HFA) 108 (90 Base) MCG/ACT inhaler INHALE 2 PUFFS BY MOUTH INTO THE LUNGS EVERY 4 HOURS AS NEEDED FOR SHORTNESS OF BREATH OR WHEEZING 8.5 g 5   aspirin EC 81 MG tablet Take 1 tablet (81 mg total) by mouth daily. Swallow  whole. 90 tablet 3   budesonide-formoterol (SYMBICORT) 160-4.5 MCG/ACT inhaler Inhale 2 puffs into the lungs 2 (two) times daily. 10.2 g 5   levETIRAcetam (KEPPRA) 1000 MG tablet Take 1 tablet (1,000 mg total) by mouth 2 (two) times daily. 20 tablet 0   midodrine (PROAMATINE) 2.5 MG tablet Take 1 tablet (2.5 mg total) by mouth 2 (two) times daily with a meal. 180 tablet 1   rosuvastatin (CRESTOR) 10 MG tablet Take 1 tablet (10 mg total) by mouth daily. 90 tablet 1   traZODone (DESYREL) 100 MG tablet TAKE 1 TO 2 TABLETS(100 TO 200 MG) BY MOUTH AT BEDTIME 60 tablet 2   nicotine (NICODERM CQ - DOSED IN MG/24 HOURS) 21 mg/24hr patch Place 1 patch (21 mg total) onto the skin daily. (Patient not taking: Reported on 12/11/2022) 28 patch  0   benzonatate (TESSALON) 100 MG capsule Take 1 capsule (100 mg total) by mouth 3 (three) times daily. (Patient not taking: Reported on 12/11/2022) 20 capsule 0   oxyCODONE-acetaminophen (PERCOCET) 5-325 MG tablet Take 1 tablet by mouth every 4 (four) hours as needed for severe pain. (Patient not taking: Reported on 12/11/2022) 20 tablet 0   No facility-administered medications prior to visit.   Past Medical History:  Diagnosis Date   Abdominal pain 08/30/2019   Alcohol withdrawal ([place]) 07/29/2013   Allergy    Anxiety    Arthritis    Benzodiazepine abuse ([place]) 08/30/2019   Bipolar disorder ([place])    CAD (coronary artery disease)    Carotid arterial disease (HCC)    Chronic insomnia 06/06/2015   Chronic low back pain 12/28/2013   Chronic migraine without aura, with intractable migraine, so stated, with status migrainosus 08/25/2019   Claustrophobia    Cocaine abuse ([place]) 05/22/2015   COPD (chronic obstructive pulmonary disease) ([place])    Delirium tremens ([place]) 07/29/2013   Epilepsy ([place])    last seizure week of 06-25-2020- sev. grand mal seizures per pt    Hemiplegic migraine with status migrainosus 06/06/2015   Migraine    Opioid use disorder 08/30/2019   PAD (peripheral artery disease) (HCC)    PONV (postoperative nausea and vomiting)    Rib fractures 09/07/2022   Stroke [facility])    patient denies- states had a hemiplegic migraine in ~2011   Substance abuse ([place])    Past Surgical History:  Procedure Laterality Date   ANKLE SURGERY     in high school   APPENDECTOMY     COLONOSCOPY     KNEE ARTHROSCOPY Right    NOSE SURGERY     REVERSE SHOULDER ARTHROPLASTY Right 03/27/2022   Procedure: REVERSE SHOULDER ARTHROPLASTY;  Surgeon: Hiram Gash, MD;  Location: [facility];  Service: Orthopedics;  Laterality: Right;   SHOULDER ARTHROSCOPY WITH DISTAL CLAVICLE RESECTION Right 12/04/2021   Procedure: SHOULDER ARTHROSCOPY WITH DISTAL CLAVICLE EXCISION;  Surgeon: Hiram Gash, MD;  Location: [facility];  Service: Orthopedics;  Laterality: Right;   SHOULDER ARTHROSCOPY WITH SUBACROMIAL DECOMPRESSION, ROTATOR CUFF REPAIR AND BICEP TENDON REPAIR Right 12/04/2021   Procedure: SHOULDER ARTHROSCOPY WITH SUBACROMIAL DECOMPRESSION, ROTATOR CUFF REPAIR AND BICEP TENDON REPAIR;  Surgeon: Hiram Gash, MD;  Location: [place];  Service: Orthopedics;  Laterality: Right;   Allergies  Allergen Reactions   Sertraline Other (See Comments)    Erectile dysfunction   Codeine Nausea And Vomiting   Depakote [Divalproex Sodium] Other (See Comments)    Made patient shake   Migranal [  Dihydroergotamine] Nausea And Vomiting   Topamax [Topiramate] Other (See Comments)    Made patient angry      Objective:    Physical Exam Vitals and nursing note reviewed.  Constitutional:      General: He is not in acute distress.    Appearance: Normal appearance.  HENT:     Head: Normocephalic.  Cardiovascular:     Rate and Rhythm: Normal rate and regular rhythm.  Pulmonary:     Effort: Pulmonary effort is normal.     Breath sounds: Normal breath sounds.  Musculoskeletal:     Right shoulder: Tenderness and bony tenderness present. Decreased range of motion. Decreased strength.     Cervical back: Normal range of motion.  Skin:    General: Skin is warm and dry.  Neurological:     Mental Status: He is alert and oriented to person, place, and time.  Psychiatric:        Mood and Affect: Mood normal.    BP 123/84 (BP Location: Left Arm, Patient Position: Sitting, Cuff Size: Large)   Pulse 74   Temp 98.2 F (36.8 C) (Temporal)   Ht 6' (1.829 m)   Wt 154 lb 2 oz (69.9 kg)   SpO2 96%   BMI 20.90 kg/m  Wt Readings from Last 3 Encounters:  12/11/22 154 lb 2 oz (69.9 kg)  10/15/22 150 lb (68 kg)  09/11/22 146 lb 4 oz (66.3 kg)      Jeanie Sewer, NP

## 2022-12-11 NOTE — Patient Instructions (Addendum)
It was very nice to see you today!    I have sent a medication to your pharmacy to help your shoulder pain. I also refilled your Trazodone.  I have ordered a referral for help with your living situation.   Schedule a 6 month follow up visit.        PLEASE NOTE:  If you had any lab tests please let us know if you have not heard back within a few days. You may see your results on MyChart before we have a chance to review them but we will give you a call once they are reviewed by [place]. If we ordered any referrals today, please let us know if you have not heard from their office within the next week.

## 2022-12-12 ENCOUNTER — Telehealth: Payer: Self-pay | Admitting: *Deleted

## 2022-12-12 ENCOUNTER — Encounter: Payer: Self-pay | Admitting: Family

## 2022-12-12 DIAGNOSIS — G8929 Other chronic pain: Secondary | ICD-10-CM | POA: Insufficient documentation

## 2022-12-12 NOTE — Assessment & Plan Note (Signed)
Chronic, stable pt reports having problem since childhood tried many different medicines, all have interaction with his Keppra, except Trazodone he can take without side effects refilling Trazodone '100mg'$  qhs f/u in 3 mos.

## 2022-12-12 NOTE — Assessment & Plan Note (Signed)
total joint replacement in 03/2022 had accident last summer in which he reinjured shoulder states he  unable to use his right arm pt advised to call his Ortho to f/u, but states he can't afford it sending Diclofenac sodium, '75mg'$  bid to help with pain, advised on use & SE f/u prn

## 2022-12-12 NOTE — Progress Notes (Unsigned)
  Care Coordination  Outreach Note  12/12/2022 Name: Spencer Mendoza MRN: [id number] DOB: 05-17-1961   Care Coordination Outreach Attempts: An unsuccessful telephone outreach was attempted today to offer the patient information about available care coordination services as a benefit of their health plan.   Follow Up Plan:  Additional outreach attempts will be made to offer the patient care coordination information and services.   Encounter Outcome:  No Answer  [place]  Direct Dial: [phone]

## 2022-12-12 NOTE — Telephone Encounter (Signed)
Pt called our office back instead, please call pt back. Thank you.

## 2022-12-15 NOTE — Progress Notes (Signed)
  Care Coordination  Outreach Note  12/15/2022 Name: Spencer Mendoza MRN: [id number] DOB: 07/02/1961   Care Coordination Outreach Attempts: A second unsuccessful outreach was attempted today to offer the patient with information about available care coordination services as a benefit of their health plan.     Follow Up Plan:  Additional outreach attempts will be made to offer the patient care coordination information and services.   Encounter Outcome:  No Answer  [place]  Direct Dial: [phone]

## 2022-12-15 NOTE — Progress Notes (Signed)
  Care Coordination   Note   12/15/2022 Name: Spencer Mendoza MRN: [id number] DOB: 08-Jun-1961  [name] is a 62 y.o. year old male who sees Jeanie Sewer, NP for primary care. I reached out to [place]on Callas by phone today to offer care coordination services.  [name] was given information about Care Coordination services today including:   The Care Coordination services include support from the care team which includes your Nurse Coordinator, Clinical Social Worker, or Pharmacist.  The Care Coordination team is here to help remove barriers to the health concerns and goals most important to you. Care Coordination services are voluntary, and the patient may decline or stop services at any time by request to their care team member.   Care Coordination Consent Status: Patient agreed to services and verbal consent obtained.   Follow up plan:  Telephone appointment with care coordination team member scheduled for:  12/16/22  Encounter Outcome:  Pt. Scheduled  [place]  Direct Dial: [phone]

## 2022-12-16 ENCOUNTER — Ambulatory Visit: Payer: Self-pay | Admitting: Licensed Clinical Social Worker

## 2022-12-19 NOTE — Patient Instructions (Signed)
Visit Information  Thank you for taking time to visit with me today. Please don't hesitate to contact me if I can be of assistance to you.   Following are the goals we discussed today:   Goals Addressed             This Visit's Progress    Obtain Safe and Stable Housing   On track    Care Coordination Interventions: Solution-Focused Strategies employed:  Active listening / Reflection utilized  Emotional Support Provided Verbalization of feelings encouraged  LCSW spoke with Mel[place] and sister, Katharine Look Patient's sister recently passed and he has been assisting with providing care to family with chronic health conditions. Due to services being placed in the home, patient is in need of housing by Feb 29 Family identified housing barriers and has searched for housing in different counties LCSW discussed various resources and agreed to email information, per family request         Our next appointment is by telephone on 12/25/22 at 10 AM  Please call the care guide team at [phone] if you need to cancel or reschedule your appointment.   If you are experiencing a Mental Health or [place] or need someone to talk to, please call the Suicide and Crisis Lifeline: 988 call 911   Patient verbalizes understanding of instructions and care plan provided today and agrees to view in [place]. Active MyChart status and patient understanding of how to access instructions and care plan via MyChart confirmed with patient.     Christa See, [email or web address], [email or web address] Phone [phone] 4:42 AM

## 2022-12-19 NOTE — Patient Outreach (Signed)
  Care Coordination Late Entry   Initial Visit Note   Outreach completed 12/16/22 Name: Spencer Mendoza MRN: [id number] DOB: [birth year]  Spencer Mendoza is a 62 y.o. year old male who sees Jeanie Sewer, NP for primary care. I spoke with  Angus Callas by phone today.  What matters to the patients health and wellness today?  Housing    Goals Addressed             This Visit's Progress    Obtain Safe and Stable Housing   On track    Care Coordination Interventions: Solution-Focused Strategies employed:  Active listening / Reflection utilized  Emotional Support Provided Verbalization of feelings encouraged  LCSW spoke with Ana and sister, Katharine Look Patient's sister recently passed and he has been assisting with providing care to family with chronic health conditions. Due to services being placed in the home, patient is in need of housing by Feb 29 Family identified housing barriers and has searched for housing in different counties LCSW discussed various resources and agreed to email information, per family request         SDOH assessments and interventions completed:  Yes  SDOH Interventions Today    Flowsheet Row Most Recent Value  SDOH Interventions   Housing Interventions Other (Comment)  [[employer]]        Care Coordination Interventions:  Yes, provided   Follow up plan: Follow up call scheduled for 1-2 weeks    Encounter Outcome:  Pt. Visit Completed   Christa See, MSW, [email or web address] Phone [phone] 4:41 AM

## 2022-12-25 ENCOUNTER — Ambulatory Visit: Payer: Self-pay | Admitting: Licensed Clinical Social Worker

## 2022-12-25 NOTE — Patient Outreach (Signed)
  Care Coordination   Follow Up Visit Note   12/25/2022 Name: JOHARI PINNEY MRN: [id number] DOB: 12/05/1960  GEMINI BEAUMIER is a 62 y.o. year old male who sees Jeanie Sewer, NP for primary care. I spoke with  Laurie Callas by phone today.  What matters to the patients health and wellness today?  Housing    Goals Addressed             This Visit's Progress    COMPLETED: Obtain Safe and Stable Housing   On track    Care Coordination Interventions: Solution-Focused Strategies employed:  Active listening / Reflection utilized  Emotional Support Provided Verbalization of feelings encouraged  LCSW spoke with patient and sister, Katharine Look Patient and sister will be able to remain at current residence with family. States it was a misunderstanding that they had to leave by end of the month No additional resource and/or mental health needs identified LCSW encouraged family to contact her should any needs arise           SDOH assessments and interventions completed:  Yes  SDOH Interventions Today    Flowsheet Row Most Recent Value  SDOH Interventions   Food Insecurity Interventions Intervention Not Indicated  Housing Interventions Intervention Not Indicated  Transportation Interventions Intervention Not Indicated        Care Coordination Interventions:  Yes, provided   Follow up plan: No further intervention required.   Encounter Outcome:  Pt. Visit Completed   Christa See, MSW, [email or web address] Phone [phone] 12:21 PM'

## 2022-12-25 NOTE — Patient Instructions (Signed)
Visit Information  Thank you for taking time to visit with me today. Please don't hesitate to contact me if I can be of assistance to you.   Following are the goals we discussed today:   Goals Addressed             This Visit's Progress    COMPLETED: Obtain Safe and Stable Housing   On track    Care Coordination Interventions: Solution-Focused Strategies employed:  Active listening / Reflection utilized  Emotional Support Provided Verbalization of feelings encouraged  LCSW spoke with patient and sister, Spencer Mendoza Patient and sister will be able to remain at current residence with family. States it was a misunderstanding that they had to leave by end of the month No additional resource and/or mental health needs identified LCSW encouraged family to contact her should any needs arise           If you are experiencing a Mental Health or [place] or need someone to talk to, please call the Suicide and Crisis Lifeline: 988 call 911   Patient verbalizes understanding of instructions and care plan provided today and agrees to view in [place]. Active MyChart status and patient understanding of how to access instructions and care plan via MyChart confirmed with patient.     No further follow up required:    Christa See, MSW, [email or web address] Phone [phone] 12:31 PM

## 2023-01-20 ENCOUNTER — Telehealth: Payer: Self-pay | Admitting: Neurology

## 2023-01-20 MED ORDER — DEXAMETHASONE 2 MG PO TABS: ORAL_TABLET | ORAL | 0 refills | Status: DC

## 2023-01-20 NOTE — Telephone Encounter (Signed)
Pt said levETIRAcetam (KEPPRA) 1000 MG tablet is working,having petite seizures a whole a lot for 2 months. Have been getting worse. Would like a call from the nurse.

## 2023-01-20 NOTE — Telephone Encounter (Signed)
Pt said need a medication refill for 3 day step down steroid for onset migraines. But do not know the name of medication.  Could not find medication in chart.

## 2023-01-20 NOTE — Telephone Encounter (Signed)
See other phone note

## 2023-01-20 NOTE — Addendum Note (Signed)
Addended by: Suzzanne Cloud on: 01/20/2023 12:21 PM   Modules accepted: Orders

## 2023-01-20 NOTE — Telephone Encounter (Signed)
Please ask him to schedule revisit appointment.  Reviewing the chart, 09/21/22 admitted for altered mental status, alcohol level 214.  Admitted October 2023 for syncope found to have multiple rib fractures, 40 pound weight loss in the last few months.  Recommended SNF, but he wanted to go home.  He was started on midodrine.  No mention of recent seizure activity.  He was on Keppra 1000 mg twice a day.  MRI brain negative for acute stroke.  He had bradycardia.  Saw cardiology 10/15/22 felt syncope was primarily due to orthostasis.  He had stopped midodrine, it was restarted.  Supposed to follow-up in 6 weeks.  We had previously prescribed Decadron 2 mg, 3-day taper to break cycle of migraine.  When last seen in April 2023 we will plan to restart Botox. If he is in prolonged migraine, we can send again, but will still need to address long term management of migraines.

## 2023-01-21 ENCOUNTER — Telehealth: Payer: Self-pay | Admitting: Neurology

## 2023-01-21 DIAGNOSIS — R569 Unspecified convulsions: Secondary | ICD-10-CM

## 2023-01-21 NOTE — Telephone Encounter (Signed)
That is fine. We will keep the referral and I will see him on the 14

## 2023-01-21 NOTE — Telephone Encounter (Signed)
Referral for neurology fax to [facility] to see Dr,.Zeb Comfort. Phone: [phone], Fax; [phone].

## 2023-01-21 NOTE — Telephone Encounter (Signed)
I called the patient, remains on Keppra 1000 mg twice a day.  Reports frequent many seizures, zap to the brain, then lapse in memory.  Has been consistent with seizures in the past.  Reports generalized seizure several months ago during time of stress and anxiety.  Claims history of seizures since he was a child, his mother had epilepsy.  Ambulatory 24-hour EEG was normal in 2015.  I suggested he remain on Keppra, refrain from driving, I was able to get him an appointment 01/29/23 815 to see Dr. April Manson.  I will go ahead and place a referral to Dr. Hortense Ramal for EMU admission for classification of seizures.  Initially came to see Dr. Jannifer Franklin for seizures versus pseudoseizures.  Has previously been treated with Depakote, Lamictal, Topamax.  He has underlying mood disorder (anxiety, bipolar), history of alcohol abuse.  Orders Placed This Encounter  Procedures   Ambulatory referral to Neurology

## 2023-01-29 ENCOUNTER — Encounter: Payer: Self-pay | Admitting: Neurology

## 2023-01-29 ENCOUNTER — Ambulatory Visit: Payer: Medicare HMO | Admitting: Neurology

## 2023-01-29 VITALS — BP 112/68 | HR 54 | Ht 72.0 in | Wt 159.0 lb

## 2023-01-29 DIAGNOSIS — F39 Unspecified mood [affective] disorder: Secondary | ICD-10-CM | POA: Diagnosis not present

## 2023-01-29 DIAGNOSIS — R569 Unspecified convulsions: Secondary | ICD-10-CM

## 2023-01-29 DIAGNOSIS — G40A09 Absence epileptic syndrome, not intractable, without status epilepticus: Secondary | ICD-10-CM | POA: Diagnosis not present

## 2023-01-29 DIAGNOSIS — G43711 Chronic migraine without aura, intractable, with status migrainosus: Secondary | ICD-10-CM

## 2023-01-29 DIAGNOSIS — F319 Bipolar disorder, unspecified: Secondary | ICD-10-CM | POA: Diagnosis not present

## 2023-01-29 MED ORDER — LEVETIRACETAM 1000 MG PO TABS: 1000.0000 mg | ORAL_TABLET | Freq: Two times a day (BID) | ORAL | 3 refills | Status: DC

## 2023-01-29 MED ORDER — RIZATRIPTAN BENZOATE 10 MG PO TBDP: 10.0000 mg | ORAL_TABLET | ORAL | 11 refills | Status: DC | PRN

## 2023-01-29 MED ORDER — ZONISAMIDE 100 MG PO CAPS: 200.0000 mg | ORAL_CAPSULE | Freq: Every day | ORAL | 6 refills | Status: DC

## 2023-01-29 MED ORDER — ONDANSETRON 4 MG PO TBDP: 4.0000 mg | ORAL_TABLET | Freq: Three times a day (TID) | ORAL | 6 refills | Status: DC | PRN

## 2023-01-29 NOTE — Progress Notes (Signed)
Patient: Spencer Mendoza Date of birth: May 24, 1961  Reason for Visit: follow up for seizure, migraine History from: patient Primary Neurologist: Dr. April Manson  HISTORY OF PRESENT ILLNESS: Today 01/29/23 Spencer Mendoza presents today for follow-up, he is accompanied by sister.  Last visit was a year ago.  Since then he reports that he has been doing okay but still continues to have his petit-mal seizures.  He described the seizures as eye blinking, then eyes will close.  Sometimes he can go into a staring, unresponsive.  At most the time he will feel a shock like sensation run thru his brain then he cannot remember anything else.  He will also have a lot of anxiety after a seizure.  He is on the Keppra 1000 mg twice daily.  He reported he still having these petit-mal seizures daily but the last generalized seizure was 4 months ago. He is pending EMU admission for capture and characterization.   From his chart review, he was admitted in November 2023 for altered mental status, his alcohol level was 214.  Again he was admitted in October, 2023 for syncope and found to have multiple fractures. He reports his last alcohol drink was on February 4 as on February 3 he was arrested with a DUI and since then he has not had any alcohol in his system.  When he comes to the headaches, he still having headaches, due to his insurance issues, he cannot afford Botox.  Currently he is not taking any prescribed medicine for the headaches.     INTERVAL HISTORY 03/04/2022:  Spencer Mendoza here today for follow-up. Admitted to [facility] October 2022 after suicide attempt overdosing on trazodone. Having right shoulder replacement, needs pre-op clearance, had right rotator cuff repair that failed after his dog jumped on him. Pending scheduling. Having migraines few times a week, takes Tylenol, doesn't help much. Has been chronic,. On keppra 1000 mg twice daily, not missing dosing. No seizures that he can recall, in Feb, few days after right  rotator cuff surgery, dog jumped on him, injured his surgical repair, not sleeping, a lot of pain (arm, migraines), blacked out, but claims wasn't seizure, endorsed by significant other.  Last Botox injection was in March 2021, was more than 75% helpful. Would like to restart Botox. He stopped smoking for surgery.   Update 02/25/2021 SS: Spencer Mendoza is a 62 year old male with history of episodic seizures associated with generalized tonic-clonic events, bipolar disorder, and migraine headaches.  Remains on Keppra 1000 mg twice daily, denies any seizure events in the last 6 months.  On average, 2 migraines a month, he keeps a Decadron taper on hand in the event of a prolonged headache, for mild headache takes Tylenol or BCs.  Claims he may only use once Decadron every other month.  Was last sent in January by his PCP.  Botox has been most beneficial, but he has not been able to afford the Botox co-pay.  Claims his mental health is doing well, can no longer get Xanax.  Takes BuSpar.  He works a few days a month, directing traffic on a Architect site.  He is involved in his church.  He presents today for evaluation accompanied by his fiance, [name].  Update 08/27/2020 SS: Spencer Mendoza is a 62 year old male with history of episodic seizures associated with generalized tonic-clonic events, bipolar disorder, and migraine headaches.  He is on trazodone 200 mg at bedtime, Keppra 1000 mg twice a day, and received Botox therapy.  At last  visit, Keppra was increased due to reported seizure 02/15/2020.  No seizure since.  Has had weight loss, has stabilized around 152 lbs, was 155 lbs 6 month ago.  Had colonoscopy, a few precancer polyps removed.  No etiology for weight loss found.  Recently having significant panic attacks, PCP put him on a short course of Xanax PRN, seen new [facility] psychiatry in November.  Not working, on disability.  More headache, could not afford Botox co-pay, last Botox in March 2021, Botox was  really beneficial for him.  Continues to smoke cigarettes, we talked about history of epilepsy versus pseudoseizures, indicates epilepsy since he was born, his mom had epilepsy.  No EtOH in over 1 year.  Here today accompanied by his significant other.  HISTORY 02/23/2020 SS: Spencer Mendoza is a 62 year old male with history of episodic seizures associated with generalized tonic-clonic events, bipolar disorder, and migraine headache.  He is taking trazodone 200 mg at bedtime, Keppra 750 mg twice a day.  He received Botox therapy in November and March for migraines.  Botox has been very helpful for his headaches, did have 1 severe headache following his Botox injection in March, keeps Decadron taper on hand, took this with good benefit once in March.  Overall, headaches doing well.  He may have had a seizure 02/15/2020, he thinks that he did, is unclear of details, worked that day, was stressed out, came home, had preseizure feeling, laid down, unclear what happened next or does not want to elaborate.  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 Yesterday complained of chest pain, pain in his left arm, shortness of breath, vomiting.  Denies any EtOH, drug use.  Is smoking half pack a day.  In less than 2 months, has lost about 15 pounds unintentionally.  He presents today accompanied by his fiance [name].  REVIEW OF SYSTEMS: Out of a complete 14 system review of symptoms, the patient complains only of the following symptoms, and all other reviewed systems are negative.  See HPI  ALLERGIES: Allergies  Allergen Reactions   Sertraline Other (See Comments)    Erectile dysfunction   Codeine Nausea And Vomiting   Depakote [Divalproex Sodium] Other (See Comments)    Made patient shake   Migranal [Dihydroergotamine] Nausea And Vomiting   Topamax [Topiramate] Other (See Comments)    Made patient angry     HOME MEDICATIONS: Outpatient Medications Prior to Visit  Medication Sig Dispense Refill   acetaminophen (TYLENOL) 500 MG tablet Take 1,000 mg by mouth 2 (two) times daily as needed (pain).     acidophilus (RISAQUAD) CAPS capsule Take 1 capsule by mouth daily.     albuterol (VENTOLIN HFA) 108 (90 Base) MCG/ACT inhaler INHALE 2 PUFFS BY MOUTH INTO THE LUNGS EVERY 4 HOURS AS NEEDED FOR SHORTNESS OF BREATH OR WHEEZING 8.5 g 5   aspirin EC 81 MG tablet Take 1 tablet (81 mg total) by mouth daily. Swallow whole. 90 tablet 3   budesonide-formoterol (SYMBICORT) 160-4.5 MCG/ACT inhaler Inhale 2 puffs into the lungs 2 (two) times daily. 10.2 g 5   dexamethasone (DECADRON) 2 MG tablet Start taking 3 tablets, then take 2, then take 1 6 tablet 0   levETIRAcetam (KEPPRA) 1000 MG tablet Take 1 tablet (1,000 mg total) by mouth 2 (two) times daily. 20 tablet 0   rosuvastatin (CRESTOR) 10 MG tablet Take 1 tablet (10 mg total)  by mouth daily. 90 tablet 1   thiamine (VITAMIN B1) 100 MG tablet Take 50 mg by mouth daily.     traZODone (DESYREL) 100 MG tablet TAKE 1 TO 2 TABLETS(100 TO 200 MG) BY MOUTH AT BEDTIME 60 tablet 2   diclofenac (VOLTAREN) 75 MG EC tablet Take 1 tablet (75 mg total) by mouth 2 (two) times daily. (Patient not taking: Reported on 01/29/2023) 60 tablet 5   midodrine (PROAMATINE) 2.5 MG tablet Take 1 tablet (2.5 mg total) by mouth 2 (two) times daily with a meal. (Patient not taking: Reported on 01/29/2023) 180 tablet 1   nicotine (NICODERM CQ - DOSED IN MG/24 HOURS) 21 mg/24hr patch Place 1 patch (21 mg total) onto the skin daily. (Patient not taking: Reported on 12/11/2022) 28 patch 0   No facility-administered medications prior to visit.    PAST MEDICAL HISTORY: Past Medical History:  Diagnosis Date   Abdominal pain 08/30/2019   Alcohol withdrawal ([place]) 07/29/2013   Allergy    Anxiety    Arthritis    Benzodiazepine abuse ([place]) 08/30/2019   Bipolar disorder ([place])    CAD (coronary  artery disease)    Carotid arterial disease ([facility])    Chronic insomnia 06/06/2015   Chronic low back pain 12/28/2013   Chronic migraine without aura, with intractable migraine, so stated, with status migrainosus 08/25/2019   Claustrophobia    Cocaine abuse ([place]) 05/22/2015   COPD (chronic obstructive pulmonary disease) ([place])    Delirium tremens ([place]) 07/29/2013   Epilepsy ([place])    last seizure week of 06-25-2020- sev. grand mal seizures per pt    Hemiplegic migraine with status migrainosus 06/06/2015   Migraine    Opioid use disorder 08/30/2019   PAD (peripheral artery disease) (HCC)    PONV (postoperative nausea and vomiting)    Rib fractures 09/07/2022   Stroke [facility])    patient denies- states had a hemiplegic migraine in ~2011   Substance abuse ([place])     PAST SURGICAL HISTORY: Past Surgical History:  Procedure Laterality Date   ANKLE SURGERY     in high school   APPENDECTOMY     COLONOSCOPY     KNEE ARTHROSCOPY Right    NOSE SURGERY     REVERSE SHOULDER ARTHROPLASTY Right 03/27/2022   Procedure: REVERSE SHOULDER ARTHROPLASTY;  Surgeon: Hiram Gash, MD;  Location: [place];  Service: Orthopedics;  Laterality: Right;   SHOULDER ARTHROSCOPY WITH DISTAL CLAVICLE RESECTION Right 12/04/2021   Procedure: SHOULDER ARTHROSCOPY WITH DISTAL CLAVICLE EXCISION;  Surgeon: Hiram Gash, MD;  Location: [place];  Service: Orthopedics;  Laterality: Right;   SHOULDER ARTHROSCOPY WITH SUBACROMIAL DECOMPRESSION, ROTATOR CUFF REPAIR AND BICEP TENDON REPAIR Right 12/04/2021   Procedure: SHOULDER ARTHROSCOPY WITH SUBACROMIAL DECOMPRESSION, ROTATOR CUFF REPAIR AND BICEP TENDON REPAIR;  Surgeon: Hiram Gash, MD;  Location: [place];  Service: Orthopedics;  Laterality: Right;    FAMILY HISTORY: Family History  Problem Relation Age of Onset   Heart disease Mother    Stroke Mother    Epilepsy Mother    Heart failure Mother    Cancer Father     Heart disease Father    Hypertension Father    Melanoma Father    Migraines Father    Colon cancer Sister 57   Colon cancer Cousin    Cancer Cousin    Colon polyps Neg Hx    Esophageal cancer Neg Hx    Rectal cancer Neg Hx  Stomach cancer Neg Hx     SOCIAL HISTORY: Social History   Socioeconomic History   Marital status: Legally Separated    Spouse name: Not on file   Number of children: 0   Years of education: GED   Highest education level: Not on file  Occupational History   Occupation: disabled  Tobacco Use   Smoking status: Every Day    Packs/day: 0.25    Years: 15.00    Additional pack years: 0.00    Total pack years: 3.75    Types: Cigarettes   Smokeless tobacco: Never   Tobacco comments:    States he quit 02-25-22  Vaping Use   Vaping Use: Never used  Substance and Sexual Activity   Alcohol use: Yes    Alcohol/week: 6.0 standard drinks of alcohol    Types: 6 Cans of beer per week    Comment: one a night   Drug use: Yes    Types: Marijuana    Comment: daily   Sexual activity: Yes    Birth control/protection: Condom  Other Topics Concern   Not on file  Social History Narrative   Pt lives with sister.   Patient drinks 2 cups of caffeine daily.   Patient is right handed.   Social Determinants of Health   Financial Resource Strain: Low Risk  (08/01/2022)   Overall Financial Resource Strain (CARDIA)    Difficulty of Paying Living Expenses: Not hard at all  Food Insecurity: No Food Insecurity (12/25/2022)   Hunger Vital Sign    Worried About Running Out of Food in the Last Year: Never true    Ran Out of Food in the Last Year: Never true  Transportation Needs: No Transportation Needs (12/25/2022)   PRAPARE - Hydrologist (Medical): No    Lack of Transportation (Non-Medical): No  Physical Activity: Inactive (08/01/2022)   Exercise Vital Sign    Days of Exercise per Week: 0 days    Minutes of Exercise per Session: 0 min   Stress: Stress Concern Present (08/01/2022)   [place]    Feeling of Stress : Rather much  Social Connections: Unknown (08/01/2022)   Social Connection and Isolation Panel [NHANES]    Frequency of Communication with Friends and Family: More than three times a week    Frequency of Social Gatherings with Friends and Family: More than three times a week    Attends Religious Services: More than 4 times per year    Active Member of [employer] or Organizations: Not on file    Attends Archivist Meetings: Not on file    Marital Status: Separated  Intimate Partner Violence: Not At Risk (09/08/2022)   Humiliation, Afraid, Rape, and Kick questionnaire    Fear of Current or Ex-Partner: No    Emotionally Abused: No    Physically Abused: No    Sexually Abused: No   PHYSICAL EXAM  Vitals:   01/29/23 0817  BP: 112/68  Pulse: (!) 54  Weight: 159 lb (72.1 kg)  Height: 6' (1.829 m)     Body mass index is 21.56 kg/m.  Generalized: Well developed, in no acute distress  Neurological examination  Mentation: Alert oriented to time, place, history taking. Follows all commands speech and language fluent Cranial nerve II-XII: Pupils were equal round reactive to light. Extraocular movements were full, visual field were full on confrontational test. Facial sensation and strength were normal. Head turning and  shoulder shrug were normal and symmetric. Motor: Good strength all extremities, exception decreased to the right upper extremity, wearing a sling post injury Sensory: Sensory testing is intact to soft touch on all 4 extremities. No evidence of extinction is noted.  Coordination: Cerebellar testing reveals good finger-nose-finger on the left Gait and station: Gait is normal.   Reflexes: Normal 2+ DIAGNOSTIC DATA (LABS, IMAGING, TESTING) - I reviewed patient records, labs, notes, testing and imaging myself where  available.  Lab Results  Component Value Date   WBC 6.7 09/09/2022   HGB 13.6 09/09/2022   HCT 39.1 09/09/2022   MCV 92.0 09/09/2022   PLT 199 09/09/2022      Component Value Date/Time   NA 136 09/09/2022 0422   NA 139 07/13/2020 0921   K 3.6 09/09/2022 0422   CL 102 09/09/2022 0422   CO2 26 09/09/2022 0422   GLUCOSE 88 09/09/2022 0422   BUN 5 (L) 09/09/2022 0422   BUN 11 07/13/2020 0921   CREATININE 0.69 09/09/2022 0422   CREATININE 0.99 03/13/2015 1004   CALCIUM 9.0 09/09/2022 0422   PROT 5.2 (L) 09/08/2022 0420   PROT 7.0 07/13/2020 0921   ALBUMIN 2.9 (L) 09/08/2022 0420   ALBUMIN 5.0 (H) 07/13/2020 0921   AST 14 (L) 09/08/2022 0420   ALT 11 09/08/2022 0420   ALKPHOS 84 09/08/2022 0420   BILITOT 0.5 09/08/2022 0420   BILITOT 0.5 07/13/2020 0921   GFRNONAA >60 09/09/2022 0422   GFRNONAA 85 03/13/2015 1004   GFRAA 91 07/13/2020 0921   GFRAA >89 03/13/2015 1004   Lab Results  Component Value Date   CHOL 143 09/09/2022   HDL 63 09/09/2022   LDLCALC 63 09/09/2022   TRIG 85 09/09/2022   CHOLHDL 2.3 09/09/2022   Lab Results  Component Value Date   HGBA1C 4.8 08/31/2021   No results found for: "VITAMINB12" Lab Results  Component Value Date   TSH 3.006 09/08/2022   ASSESSMENT AND PLAN 62 y.o. year old male  has a past medical history of Abdominal pain (08/30/2019), Alcohol withdrawal ([place]) (07/29/2013), Allergy, Anxiety, Arthritis, Benzodiazepine abuse ([place]) (08/30/2019), Bipolar disorder ([place]), CAD (coronary artery disease), Carotid arterial disease ([place]), Chronic insomnia (06/06/2015), Chronic low back pain (12/28/2013), Chronic migraine without aura, with intractable migraine, so stated, with status migrainosus (08/25/2019), Claustrophobia, Cocaine abuse ([place]) (05/22/2015), COPD (chronic obstructive pulmonary disease) ([place]), Delirium tremens ([place]) (07/29/2013), Epilepsy ([place]), Hemiplegic migraine with status migrainosus (06/06/2015), Migraine, Opioid use disorder  (08/30/2019), PAD (peripheral artery disease) ([place]), PONV (postoperative nausea and vomiting), Rib fractures (09/07/2022), Stroke ([place]), and Substance abuse ([place]). here with:  1.  History of seizures 2.  History of alcohol abuse, in early remission 3.  Mood Disorder  -Still complaints of daily petit-mal seizure -Will continue Keppra 1000 mg twice daily and add Zonisamide 200 mg nightly.  -Follow up in year or sooner if worse   3.  Chronic intractable migraine headache -Unable to afford Botox due to cost  -Zonisamide 200 mg for headache prevention  -Maxalt and Zofran as needed for the headaches  -Previously tried a multitude of medications prior including Lamictal, Depakote, amitriptyline, Topamax, Aimovig, his insurance would not cover Ajovy, he tried [employer], Imitrex, Maxalt, Toradol, diclofenac potassium; some of his migraines have had hemiplegic symptoms    Alric Ran, MD 01/29/2023, 8:22 AM [facility] [address] [place], [place] [address] [phone]

## 2023-01-29 NOTE — Patient Instructions (Addendum)
Continue Keppra 1000 mg twice daily Start Zonisamide 200 mg nightly for seizure and headache prevention Use Maxalt and Zofran as needed for headaches EMU admission (Referral already sent)  Follow-up in 1 year or sooner if worse

## 2023-02-03 ENCOUNTER — Ambulatory Visit: Payer: Medicare Other | Admitting: Neurology

## 2023-02-16 ENCOUNTER — Telehealth: Payer: Self-pay | Admitting: Neurology

## 2023-02-16 NOTE — Telephone Encounter (Signed)
Pt states the 1st week he took the   Disp Refills Start End   zonisamide (ZONEGRAN) 100 MG capsule       As instructed.  Last Wed when he started the 2 a day he has fallen twice getting out of bed.  Pt asking for a call to discuss concerns about taking this medication.

## 2023-02-17 NOTE — Telephone Encounter (Signed)
Called and relayed note to patient. Pt had no questions at this time but was encouraged to call back if questions arise. Pt verbalized understanding.

## 2023-02-17 NOTE — Telephone Encounter (Signed)
Called and spoke to patient, states he feels very dizzy since starting the medication and has had multiple falls, he started the 2/day last week and he feels like he can barely stand up. He denies starting any other medication and cannot find any relief for the dizziness. He says his last dose was yesterday morning and still has yet to feel better. Would like to stop all together, please advise

## 2023-02-17 NOTE — Telephone Encounter (Signed)
Please advise patient to decrease Keppra to 500 mg in the morning and 1000 at night. Have him take the Zonisamide 100 mg twice daily and monitor for dizziness.

## 2023-02-17 NOTE — Telephone Encounter (Signed)
Yes. Let's try decreasing the Keppra to see if the side effects will improve.

## 2023-02-18 ENCOUNTER — Telehealth: Payer: Self-pay | Admitting: Neurology

## 2023-02-18 NOTE — Telephone Encounter (Signed)
Called and relayed the medication regimen again, he states he forgot the changes. Pt had no questions at this time but was encouraged to call back if questions arise. Pt verbalized understanding.

## 2023-02-18 NOTE — Telephone Encounter (Signed)
Pt is asking for a call to discuss how he is to take his new dose of medication

## 2023-03-03 ENCOUNTER — Telehealth: Payer: Self-pay | Admitting: Neurology

## 2023-03-03 NOTE — Telephone Encounter (Signed)
Pt requesting a call from nurse to discuss [employer]. Pt states he was just giving me medication by Dr. Teresa Coombs and it's not working for him and he wouldn't be able to sit through [employer].

## 2023-03-04 NOTE — Telephone Encounter (Signed)
Pt letter was printed and picked up by sister, Rush Farmer (on DPR)

## 2023-03-04 NOTE — Telephone Encounter (Signed)
We can write him an excuse letter due to seizure and chronic migraines.

## 2023-03-04 NOTE — Telephone Encounter (Signed)
Letter was sent via [facility]

## 2023-03-05 ENCOUNTER — Other Ambulatory Visit: Payer: Self-pay | Admitting: Physician Assistant

## 2023-03-05 ENCOUNTER — Ambulatory Visit: Payer: Medicare Other | Admitting: Neurology

## 2023-03-05 NOTE — Telephone Encounter (Signed)
Pt's medication was sent to pt's pharmacy as requested. Confirmation received.  °

## 2023-03-09 ENCOUNTER — Inpatient Hospital Stay (HOSPITAL_COMMUNITY): Admission: RE | Admit: 2023-03-09 | Payer: Medicare HMO | Source: Ambulatory Visit | Admitting: Neurology

## 2023-04-03 ENCOUNTER — Other Ambulatory Visit: Payer: Self-pay | Admitting: Family

## 2023-04-03 DIAGNOSIS — F5104 Psychophysiologic insomnia: Secondary | ICD-10-CM

## 2023-04-14 DIAGNOSIS — R29810 Facial weakness: Secondary | ICD-10-CM | POA: Diagnosis not present

## 2023-04-14 DIAGNOSIS — G40909 Epilepsy, unspecified, not intractable, without status epilepticus: Secondary | ICD-10-CM | POA: Diagnosis not present

## 2023-04-14 DIAGNOSIS — F1010 Alcohol abuse, uncomplicated: Secondary | ICD-10-CM | POA: Diagnosis not present

## 2023-04-14 DIAGNOSIS — Z8669 Personal history of other diseases of the nervous system and sense organs: Secondary | ICD-10-CM | POA: Diagnosis not present

## 2023-04-14 DIAGNOSIS — Z8709 Personal history of other diseases of the respiratory system: Secondary | ICD-10-CM | POA: Diagnosis not present

## 2023-04-14 DIAGNOSIS — G43909 Migraine, unspecified, not intractable, without status migrainosus: Secondary | ICD-10-CM | POA: Diagnosis not present

## 2023-04-14 DIAGNOSIS — Z7951 Long term (current) use of inhaled steroids: Secondary | ICD-10-CM | POA: Diagnosis not present

## 2023-04-14 DIAGNOSIS — I25118 Atherosclerotic heart disease of native coronary artery with other forms of angina pectoris: Secondary | ICD-10-CM | POA: Diagnosis not present

## 2023-04-14 DIAGNOSIS — R4701 Aphasia: Secondary | ICD-10-CM | POA: Diagnosis not present

## 2023-04-14 DIAGNOSIS — I10 Essential (primary) hypertension: Secondary | ICD-10-CM | POA: Diagnosis not present

## 2023-04-14 DIAGNOSIS — G40A09 Absence epileptic syndrome, not intractable, without status epilepticus: Secondary | ICD-10-CM | POA: Diagnosis not present

## 2023-04-14 DIAGNOSIS — R0689 Other abnormalities of breathing: Secondary | ICD-10-CM | POA: Diagnosis not present

## 2023-04-14 DIAGNOSIS — G43419 Hemiplegic migraine, intractable, without status migrainosus: Secondary | ICD-10-CM | POA: Diagnosis not present

## 2023-04-14 DIAGNOSIS — R413 Other amnesia: Secondary | ICD-10-CM | POA: Diagnosis not present

## 2023-04-14 DIAGNOSIS — F329 Major depressive disorder, single episode, unspecified: Secondary | ICD-10-CM | POA: Diagnosis not present

## 2023-04-14 DIAGNOSIS — R4182 Altered mental status, unspecified: Secondary | ICD-10-CM | POA: Diagnosis not present

## 2023-04-14 DIAGNOSIS — J449 Chronic obstructive pulmonary disease, unspecified: Secondary | ICD-10-CM | POA: Diagnosis not present

## 2023-04-14 DIAGNOSIS — Z79899 Other long term (current) drug therapy: Secondary | ICD-10-CM | POA: Diagnosis not present

## 2023-04-14 DIAGNOSIS — W19XXXA Unspecified fall, initial encounter: Secondary | ICD-10-CM | POA: Diagnosis not present

## 2023-04-14 DIAGNOSIS — J439 Emphysema, unspecified: Secondary | ICD-10-CM | POA: Diagnosis not present

## 2023-04-14 DIAGNOSIS — R42 Dizziness and giddiness: Secondary | ICD-10-CM | POA: Diagnosis not present

## 2023-04-14 DIAGNOSIS — R519 Headache, unspecified: Secondary | ICD-10-CM | POA: Diagnosis not present

## 2023-04-14 DIAGNOSIS — R200 Anesthesia of skin: Secondary | ICD-10-CM | POA: Diagnosis not present

## 2023-04-14 DIAGNOSIS — F339 Major depressive disorder, recurrent, unspecified: Secondary | ICD-10-CM | POA: Diagnosis not present

## 2023-04-14 DIAGNOSIS — T50902D Poisoning by unspecified drugs, medicaments and biological substances, intentional self-harm, subsequent encounter: Secondary | ICD-10-CM | POA: Diagnosis not present

## 2023-04-14 DIAGNOSIS — Z8659 Personal history of other mental and behavioral disorders: Secondary | ICD-10-CM | POA: Diagnosis not present

## 2023-04-14 DIAGNOSIS — G43409 Hemiplegic migraine, not intractable, without status migrainosus: Secondary | ICD-10-CM | POA: Diagnosis not present

## 2023-04-14 DIAGNOSIS — F17210 Nicotine dependence, cigarettes, uncomplicated: Secondary | ICD-10-CM | POA: Diagnosis not present

## 2023-04-14 DIAGNOSIS — E440 Moderate protein-calorie malnutrition: Secondary | ICD-10-CM | POA: Diagnosis not present

## 2023-04-14 DIAGNOSIS — E43 Unspecified severe protein-calorie malnutrition: Secondary | ICD-10-CM | POA: Diagnosis not present

## 2023-04-14 DIAGNOSIS — Z681 Body mass index (BMI) 19 or less, adult: Secondary | ICD-10-CM | POA: Diagnosis not present

## 2023-04-14 DIAGNOSIS — G4489 Other headache syndrome: Secondary | ICD-10-CM | POA: Diagnosis not present

## 2023-04-15 DIAGNOSIS — Z8659 Personal history of other mental and behavioral disorders: Secondary | ICD-10-CM | POA: Diagnosis not present

## 2023-04-15 DIAGNOSIS — Z79899 Other long term (current) drug therapy: Secondary | ICD-10-CM | POA: Diagnosis not present

## 2023-04-15 DIAGNOSIS — J449 Chronic obstructive pulmonary disease, unspecified: Secondary | ICD-10-CM | POA: Diagnosis not present

## 2023-04-15 DIAGNOSIS — I10 Essential (primary) hypertension: Secondary | ICD-10-CM | POA: Diagnosis not present

## 2023-04-15 DIAGNOSIS — R413 Other amnesia: Secondary | ICD-10-CM | POA: Diagnosis not present

## 2023-04-15 DIAGNOSIS — G43409 Hemiplegic migraine, not intractable, without status migrainosus: Secondary | ICD-10-CM | POA: Diagnosis not present

## 2023-04-15 DIAGNOSIS — R42 Dizziness and giddiness: Secondary | ICD-10-CM | POA: Diagnosis not present

## 2023-04-15 DIAGNOSIS — R519 Headache, unspecified: Secondary | ICD-10-CM | POA: Diagnosis not present

## 2023-04-16 DIAGNOSIS — G43409 Hemiplegic migraine, not intractable, without status migrainosus: Secondary | ICD-10-CM | POA: Diagnosis not present

## 2023-04-16 DIAGNOSIS — Z7951 Long term (current) use of inhaled steroids: Secondary | ICD-10-CM | POA: Diagnosis not present

## 2023-04-16 DIAGNOSIS — Z8659 Personal history of other mental and behavioral disorders: Secondary | ICD-10-CM | POA: Diagnosis not present

## 2023-04-16 DIAGNOSIS — Z79899 Other long term (current) drug therapy: Secondary | ICD-10-CM | POA: Diagnosis not present

## 2023-04-16 DIAGNOSIS — I10 Essential (primary) hypertension: Secondary | ICD-10-CM | POA: Diagnosis not present

## 2023-04-16 DIAGNOSIS — J449 Chronic obstructive pulmonary disease, unspecified: Secondary | ICD-10-CM | POA: Diagnosis not present

## 2023-04-16 DIAGNOSIS — E440 Moderate protein-calorie malnutrition: Secondary | ICD-10-CM | POA: Diagnosis not present

## 2023-04-17 DIAGNOSIS — J449 Chronic obstructive pulmonary disease, unspecified: Secondary | ICD-10-CM | POA: Diagnosis not present

## 2023-04-17 DIAGNOSIS — Z8659 Personal history of other mental and behavioral disorders: Secondary | ICD-10-CM | POA: Diagnosis not present

## 2023-04-17 DIAGNOSIS — G43409 Hemiplegic migraine, not intractable, without status migrainosus: Secondary | ICD-10-CM | POA: Diagnosis not present

## 2023-04-17 DIAGNOSIS — E43 Unspecified severe protein-calorie malnutrition: Secondary | ICD-10-CM | POA: Diagnosis not present

## 2023-04-17 DIAGNOSIS — Z79899 Other long term (current) drug therapy: Secondary | ICD-10-CM | POA: Diagnosis not present

## 2023-04-17 DIAGNOSIS — Z8669 Personal history of other diseases of the nervous system and sense organs: Secondary | ICD-10-CM | POA: Diagnosis not present

## 2023-04-17 DIAGNOSIS — I10 Essential (primary) hypertension: Secondary | ICD-10-CM | POA: Diagnosis not present

## 2023-04-18 DIAGNOSIS — F1010 Alcohol abuse, uncomplicated: Secondary | ICD-10-CM | POA: Diagnosis not present

## 2023-04-18 DIAGNOSIS — F339 Major depressive disorder, recurrent, unspecified: Secondary | ICD-10-CM | POA: Diagnosis not present

## 2023-04-18 DIAGNOSIS — I25118 Atherosclerotic heart disease of native coronary artery with other forms of angina pectoris: Secondary | ICD-10-CM | POA: Diagnosis not present

## 2023-04-18 DIAGNOSIS — J449 Chronic obstructive pulmonary disease, unspecified: Secondary | ICD-10-CM | POA: Diagnosis not present

## 2023-04-18 DIAGNOSIS — G43909 Migraine, unspecified, not intractable, without status migrainosus: Secondary | ICD-10-CM | POA: Diagnosis not present

## 2023-04-18 DIAGNOSIS — G40909 Epilepsy, unspecified, not intractable, without status epilepticus: Secondary | ICD-10-CM | POA: Diagnosis not present

## 2023-04-18 DIAGNOSIS — T50902D Poisoning by unspecified drugs, medicaments and biological substances, intentional self-harm, subsequent encounter: Secondary | ICD-10-CM | POA: Diagnosis not present

## 2023-04-18 DIAGNOSIS — I10 Essential (primary) hypertension: Secondary | ICD-10-CM | POA: Diagnosis not present

## 2023-04-20 ENCOUNTER — Telehealth: Payer: Self-pay

## 2023-04-20 DIAGNOSIS — J441 Chronic obstructive pulmonary disease with (acute) exacerbation: Secondary | ICD-10-CM

## 2023-04-20 NOTE — Telephone Encounter (Signed)
   Telephone encounter was:  Unsuccessful.  04/20/2023 Name: QUILL RAJARAM MRN: [id number] DOB: Apr 03, 1961  Unsuccessful outbound call made today to assist with:  Transportation Needs   Outreach Attempt:  1st Attempt  Unable to leave message, voicemail not set-up.  Jame Seelig [facility]  [facility] Population Health Community Resource Care Guide   [email or web address]  ?? [phone]   Website: [email or web address]  [email or web address]

## 2023-04-20 NOTE — Transitions of Care (Post Inpatient/ED Visit) (Signed)
04/20/2023  Name: Spencer Mendoza MRN: [id number] DOB: 1961/07/10  Today's TOC FU Call Status: Today's TOC FU Call Status:: Successful TOC FU Call Competed TOC FU Call Complete Date: 04/20/23  Transition Care Management Follow-up Telephone Call Date of Discharge: 04/18/23 Discharge Facility: Other ([facility]) Name of Other ([facility]) Discharge Facility: [facility] Type of Discharge: Inpatient Admission Primary Inpatient Discharge Diagnosis:: "AMS" How have you been since you were released from the hospital?: Better (Pt reports no further migraines/neuro issues since returning home. States he still has some memory issues at [employer] helping him out. He has chronic shoulder pain-followedd by ortho and needs surgery) Any questions or concerns?: No  Items Reviewed: Did you receive and understand the discharge instructions provided?: Yes Medications obtained,verified, and reconciled?: Yes (Medications Reviewed) Any new allergies since your discharge?: No Dietary orders reviewed?: Yes Type of Diet Ordered:: low salt/heart healthy Do you have support at home?: Yes People in Home: alone, sibling(s) Name of Support/Comfort Primary Source: Sandra-sister lives nearby  Medications Reviewed Today: Medications Reviewed Today     Reviewed by Charlyn Minerva, RN (Registered Nurse) on 04/20/23 at 1252  Med List Status: <None>   Medication Order Taking? Sig Documenting Provider Last Dose Status Informant  acetaminophen (TYLENOL) 500 MG tablet [id number] Yes Take 1,000 mg by mouth 2 (two) times daily as needed (pain). [provider] Taking Active Self, Pharmacy Records  acidophilus (RISAQUAD) CAPS capsule [id number] No Take 1 capsule by mouth daily.  Patient not taking: Reported on 04/20/2023   [provider] Not Taking Active Self, Pharmacy Records  albuterol (VENTOLIN HFA) 108 (90 Base) MCG/ACT inhaler [id number] Yes INHALE 2 PUFFS BY MOUTH INTO  THE LUNGS EVERY 4 HOURS AS NEEDED FOR SHORTNESS OF BREATH OR WHEEZING Hudnell, Stephanie, NP Taking Active Self, Pharmacy Records  aspirin EC 81 MG tablet [id number] Yes Take 1 tablet (81 mg total) by mouth daily. Swallow whole. Laurann Montana, PA-C Taking Active   budesonide-formoterol [facility]) 160-4.5 MCG/ACT inhaler [id number] Yes Inhale 2 puffs into the lungs 2 (two) times daily. Dulce Sellar, NP Taking Active Self, Pharmacy Records  dexamethasone (DECADRON) 2 MG tablet [id number] No Start taking 3 tablets, then take 2, then take 1  Patient not taking: Reported on 04/20/2023   Glean Salvo, NP Not Taking Active   diclofenac (VOLTAREN) 75 MG EC tablet [id number] No Take 1 tablet (75 mg total) by mouth 2 (two) times daily.  Patient not taking: Reported on 01/29/2023   Dulce Sellar, NP Not Taking Active   levETIRAcetam (KEPPRA) 1000 MG tablet [id number] Yes Take 1 tablet (1,000 mg total) by mouth 2 (two) times daily.  Patient taking differently: Take 1,000 mg by mouth 2 (two) times daily. Taking 500mg  QAM, 1000mg  QHS   Windell Norfolk, MD Taking Active   midodrine (PROAMATINE) 2.5 MG tablet [id number] No Take 1 tablet (2.5 mg total) by mouth 2 (two) times daily with a meal.  Patient not taking: Reported on 01/29/2023   Laurann Montana, PA-C Not Taking Active   nicotine (NICODERM CQ - DOSED IN MG/24 HOURS) 21 mg/24hr patch [id number] No Place 1 patch (21 mg total) onto the skin daily.  Patient not taking: Reported on 12/11/2022   Rolly Salter, MD Not Taking Active   ondansetron (ZOFRAN-ODT) 4 MG disintegrating tablet [id number] Yes Take 1 tablet (4 mg total) by mouth every 8 (eight) hours as needed for nausea or vomiting. Windell Norfolk, MD Taking Active   rizatriptan (MAXALT-MLT) 10 MG  disintegrating tablet [id number] No Take 1 tablet (10 mg total) by mouth as needed for migraine. May repeat in 2 hours if needed  Patient not taking: Reported on 04/20/2023   Windell Norfolk, MD Not Taking Active    rosuvastatin (CRESTOR) 10 MG tablet [id number] Yes TAKE 1 TABLET(10 MG) BY MOUTH DAILY Dunn, Tacey Ruiz, PA-C Taking Active   thiamine (VITAMIN B1) 100 MG tablet [id number] Yes Take 50 mg by mouth daily. [provider] Taking Active   traZODone (DESYREL) 100 MG tablet [id number] Yes TAKE 1 TO 2 TABLETS(100 TO 200 MG) BY MOUTH AT BEDTIME Hudnell, Judeth Cornfield, NP Taking Active   zonisamide (ZONEGRAN) 100 MG capsule [id number] Yes Take 2 capsules (200 mg total) by mouth daily. Windell Norfolk, MD Taking Active             Home Care and Equipment/Supplies: Were Home Health Services Ordered?: NA Any new equipment or medical supplies ordered?: NA  Functional Questionnaire: Do you need assistance with meal preparation?: No Do you need assistance with eating?: No Do you have difficulty maintaining continence: No Do you need assistance with getting out of bed/getting out of a chair/moving?: No Do you have difficulty managing or taking your medications?: No  Follow up appointments reviewed: PCP Follow-up appointment confirmed?: No (Pt did not want to make MD appt until he discusses with sister to see if she can take him or other transportation arranged) MD Provider Line Number:[phone] Given: No Specialist Hospital Follow-up appointment confirmed?: No Reason Specialist Follow-Up Not Confirmed: Patient has Specialist Provider Number and will Call for Appointment (pt has neuro appt and will call to make appt) Do you need transportation to your follow-up appointment?: Yes Transportation Need Intervention Addressed By:: AMB Referral For SDOH Needs Do you understand care options if your condition(s) worsen?: Yes-patient verbalized understanding  SDOH Interventions Today    Flowsheet Row Most Recent Value  SDOH Interventions   Food Insecurity Interventions Intervention Not Indicated  Transportation Interventions AMB Referral  [pt reports with new dx of epilepsy/seizures-he has ben told  unable to drive for a year-needs transportation resources-pt will call [employer] to review benefis-care guide referral placed as well]      TOC Interventions Today    Flowsheet Row Most Recent Value  TOC Interventions   TOC Interventions Discussed/Reviewed TOC Interventions Discussed      Interventions Today    Flowsheet Row Most Recent Value  Chronic Disease   Chronic disease during today's visit Other  [epilepsy/seizure]  General Interventions   General Interventions Discussed/Reviewed General Interventions Discussed, Doctor Visits, Building control surveyor guide referral for transportation resources]  Doctor Visits Discussed/Reviewed PCP, Doctor Visits Discussed, Specialist  PCP/Specialist Visits Compliance with follow-up visit  Education Interventions   Education Provided Provided Education  Provided Verbal Education On Nutrition, When to see the doctor, Medication  Nutrition Interventions   Nutrition Discussed/Reviewed Nutrition Discussed, Adding fruits and vegetables, Decreasing salt  Pharmacy Interventions   Pharmacy Dicussed/Reviewed Pharmacy Topics Discussed, Medications and their functions  Safety Interventions   Safety Discussed/Reviewed Safety Discussed, Home Safety       Alessandra Grout [facility] Care Management Care Management Community Coordinator Direct Phone: [phone] Toll Free: [phone] Fax: [phone]

## 2023-04-21 ENCOUNTER — Telehealth: Payer: Self-pay

## 2023-04-21 ENCOUNTER — Other Ambulatory Visit: Payer: Self-pay | Admitting: Neurology

## 2023-04-21 DIAGNOSIS — R569 Unspecified convulsions: Secondary | ICD-10-CM

## 2023-04-21 NOTE — Telephone Encounter (Signed)
   Telephone encounter was:  Successful.  04/21/2023 Name: Spencer Mendoza MRN: [id number] DOB: 1961/01/17  Spencer Mendoza is a 62 y.o. year old male who is a primary care patient of Dulce Sellar, NP . The community resource team was consulted for assistance with Transportation Needs   Care guide performed the following interventions: Spoke to patient about [employer] transportation. I offered to send the patient a brochure but he stated he already has the [employer] brochure and is able to call to schedule transportation.   Follow Up Plan:  No further follow up planned at this time. The patient has been provided with needed resources.  Seif Teichert [facility]  [facility] Population Health Community Resource Care Guide   [email or web address]  ?? [phone]   Website: [email or web address]  [email or web address]

## 2023-04-21 NOTE — Telephone Encounter (Signed)
Add him for next available. I will order him a amb eeg.

## 2023-04-21 NOTE — Telephone Encounter (Signed)
This is going to be an ambulatory EEG with [facility]. They will reach out to patient after getting approval from insurance.

## 2023-04-21 NOTE — Telephone Encounter (Signed)
Patient was recently admitted to [facility] hospital for altered mental status - diagnosis at discharge was CVA versus hemiplegic migraine with aphasia versus seizure .  Patient called in and stated he needs a two week follow up from his admission. Based on the admission records, he was advised to follow up with outpatient neurology, no time frame given. Admission records also mention that patient stated he was scheduled for an outpatient EEG and they advised him to keep that appointment. I did not see where any outpatient EEG is scheduled.  Can you please review and advise if this patient needs to be sen urgently or if an EEG needs to be scheduled for the patient?  Thanks!

## 2023-04-22 ENCOUNTER — Telehealth: Payer: Self-pay

## 2023-04-22 NOTE — Telephone Encounter (Signed)
Faxed EEG orders to [facility]

## 2023-04-30 ENCOUNTER — Inpatient Hospital Stay: Payer: Medicare HMO | Admitting: Family

## 2023-05-01 ENCOUNTER — Other Ambulatory Visit: Payer: Self-pay | Admitting: Orthopaedic Surgery

## 2023-05-01 DIAGNOSIS — M25311 Other instability, right shoulder: Secondary | ICD-10-CM

## 2023-05-01 DIAGNOSIS — M19011 Primary osteoarthritis, right shoulder: Secondary | ICD-10-CM | POA: Diagnosis not present

## 2023-05-05 ENCOUNTER — Ambulatory Visit
Admission: RE | Admit: 2023-05-05 | Discharge: 2023-05-05 | Disposition: A | Discharge status: Home / Self Care | Payer: Medicare HMO | Source: Ambulatory Visit | Attending: Orthopaedic Surgery | Admitting: Orthopaedic Surgery

## 2023-05-05 DIAGNOSIS — M25311 Other instability, right shoulder: Secondary | ICD-10-CM

## 2023-05-05 DIAGNOSIS — Z96611 Presence of right artificial shoulder joint: Secondary | ICD-10-CM | POA: Diagnosis not present

## 2023-05-13 DIAGNOSIS — G40909 Epilepsy, unspecified, not intractable, without status epilepticus: Secondary | ICD-10-CM | POA: Diagnosis not present

## 2023-05-13 DIAGNOSIS — R404 Transient alteration of awareness: Secondary | ICD-10-CM | POA: Diagnosis not present

## 2023-05-14 DIAGNOSIS — G40909 Epilepsy, unspecified, not intractable, without status epilepticus: Secondary | ICD-10-CM | POA: Diagnosis not present

## 2023-05-14 DIAGNOSIS — R404 Transient alteration of awareness: Secondary | ICD-10-CM | POA: Diagnosis not present

## 2023-05-15 DIAGNOSIS — G40909 Epilepsy, unspecified, not intractable, without status epilepticus: Secondary | ICD-10-CM | POA: Diagnosis not present

## 2023-05-15 DIAGNOSIS — R404 Transient alteration of awareness: Secondary | ICD-10-CM | POA: Diagnosis not present

## 2023-05-16 DIAGNOSIS — G40909 Epilepsy, unspecified, not intractable, without status epilepticus: Secondary | ICD-10-CM | POA: Diagnosis not present

## 2023-05-16 DIAGNOSIS — R404 Transient alteration of awareness: Secondary | ICD-10-CM | POA: Diagnosis not present

## 2023-05-26 ENCOUNTER — Encounter: Payer: Self-pay | Admitting: Neurology

## 2023-05-26 DIAGNOSIS — R569 Unspecified convulsions: Secondary | ICD-10-CM

## 2023-05-26 NOTE — Procedures (Signed)
Clinical History : This is a 62 y/o M who presents with complaints of petit mal seizures, alcohol abuse and mood disorder. He also has complaints of intractable migraine headaches.  INTERMITTENT MONITORING with VIDEO TECHNICAL SUMMARY: This AVEEG was performed using equipment provided by [employer] utilizing Bluetooth ( [employer] ) amplifiers with continuous EEGT attended video collection using encrypted remote transmission via [employer] secured cellular tower network with data rates for each AVEEG performed. This is a Therapist, music AVEEG, obtained, according to the 10-20 international electrode placement system, reformatted digitally into referential and bipolar montages. Data was acquired with a minimum of 21 bipolar connections and sampled at a minimum rate of 250 cycles per second per channel, maximum rate of 450 cycles per second per channel and two channels for EKG. The entire VEEG study was recorded through cable and or radio telemetry for subsequent analysis. Specified epochs of the AVEEG data were identified at the direction of the subject by the depression of a push button by the patient. Each patients event file included data acquired two minutes prior to the push button activation and continuing until two minutes afterwards. AVEEG files were reviewed on [employer] server, Licensed Software provided by [employer] with a digital high frequency filter set at 70 Hz and a low frequency filter set at 1 Hz with a paper speed of 21mm/s resulting in 10 seconds per digital page. This entire AVEEG was reviewed by the EEG Technologist. Random time samples, random sleep samples, clips, patient initiated push button files with included patient daily diary logs, EEG Technologist pruned data was reviewed and verified for accuracy and validity by the governing reading neurologist in full details. This AEEGV was fully compliant with all requirements for CPT 97500 for setup, patient  education, take down and administered by an EEG technologist.  Long-Term EEG with Video was monitored intermittently by a qualified EEG technologist for the entirety of the recording; quality check-ins were performed at a minimum of every two hours, checking and documenting real-time data and video to assure the integrity and quality of the recording (e.g., camera position, electrode integrity and impedance), and identify the need for maintenance. For intermittent monitoring, an EEG Technologist monitored no more than 12 patients concurrently. Diagnostic video was captured at least 80% of the time during the recording.  PATIENT EVENTS: There were no patient events noted or captured during this recording.  TECHNOLOGIST EVENTS: No clear epileptiform activity was detected by the reviewing neurodiagnostic technologist during the recording for further evaluation.  TIME SAMPLES: 10-minutes of every 2 hours recorded are reviewed as random time samples.  SLEEP SAMPLES: 5-minutes of every 24 hours recorded are reviewed as random sleep samples.  AWAKE: At maximal level of alertness, the posterior dominant background activity was continuous, reactive, low voltage rhythm of 8-8.5 Hz. This was symmetric, well-modulated, and attenuated with eye opening. Diffuse, symmetric, frontocentral beta range activity was present.  SLEEP: N1 Sleep (Stage 1) was observed and characterized by the disappearance of alpha rhythm and the appearance of vertex activity. N2 Sleep (Stage 2) was observed and characterized by vertex waves, K-complexes, and sleep spindles. N3 (Stage 3) sleep was observed and characterized by high amplitude Delta activity of 20%. REM sleep was observed.  EKG: There were no arrhythmias or abnormalities noted during this recording.  Impression: This is a normal 72 hours video ambulatory EEG. There were no seizures, no events and no epileptiform discharges seen during this  recording.    Windell Norfolk,  MD [facility]

## 2023-05-27 ENCOUNTER — Ambulatory Visit (INDEPENDENT_AMBULATORY_CARE_PROVIDER_SITE_OTHER): Payer: Medicare HMO | Admitting: Neurology

## 2023-05-27 ENCOUNTER — Ambulatory Visit (INDEPENDENT_AMBULATORY_CARE_PROVIDER_SITE_OTHER): Payer: Self-pay | Admitting: Neurology

## 2023-05-27 VITALS — BP 143/76 | HR 58 | Ht 72.0 in | Wt 159.0 lb

## 2023-05-27 DIAGNOSIS — Z0289 Encounter for other administrative examinations: Secondary | ICD-10-CM

## 2023-05-27 DIAGNOSIS — M25511 Pain in right shoulder: Secondary | ICD-10-CM | POA: Diagnosis not present

## 2023-05-27 DIAGNOSIS — G8929 Other chronic pain: Secondary | ICD-10-CM

## 2023-05-27 NOTE — Addendum Note (Signed)
Addended by: Levert Feinstein on: 05/27/2023 06:35 PM   Modules accepted: Level of Service

## 2023-05-27 NOTE — Procedures (Signed)
Full Name: Jonathen Rathman Gender: Male MRN #: [id number] Date of Birth: [birth year]    Visit Date: 05/27/2023 10:06 Age: 62 Years Examining Physician: Dr. Levert Feinstein Referring Physician: Dr. Teresa Coombs Height: 6 feet 0 inch History: History of multiple right shoulder surgery now with significant pain, limited range of motion, weakness  Summary of the test:  Nerve conduction study:  Bilateral ulnar sensory responses showed mildly prolonged peak latency with borderline snap amplitude, this could due to his cold limb temperature.  Bilateral median sensory responses showed moderately prolonged peak latency with normal snap amplitude.  Bilateral radial sensory responses were normal  Bilateral ulnar motor responses showed no significant abnormality, slight prolonged distal latency could due to cold limb temperature.  Right median motor responses showed moderately decreased CMAP amplitude, with moderately prolonged distal latency.  Electromyography: Selected needle examination was performed at right upper extremity muscles, right cervical paraspinal muscles; left upper extremity muscles and left cervical paraspinal muscles for comparison  There was mild increased insertional activity, mild chronic neuropathic changes at the right upper trapezius, pectoralis major at surrounding previous right surgical scar.  In specific, there was no active neuropathic changes noted at right deltoid, and right teres minor to suggest the right axillary neuropathy.  Conclusion: This is a mild abnormal study.  There is electrodiagnostic evidence of moderate bilateral carpal tunnel syndromes.  There is no evidence of right axillary neuropathy or right brachial plexopathy.  The mild chronic neuropathic changes of the muscles along the surgical site are expected findings.   ------------------------------- Levert Feinstein, M.D. Ph.D.  [facility] [address], [address] [place], [place]  [address] Tel: [phone] Fax: [phone]  Verbal informed consent was obtained from the patient, patient was informed of potential risk of procedure, including bruising, bleeding, hematoma formation, infection, muscle weakness, muscle pain, numbness, among others.        MNC    Nerve / Sites Muscle Latency Ref. Amplitude Ref. Rel Amp Segments Distance Velocity Ref. Area    ms ms mV mV %  cm m/s m/s mVms  R Median - APB     Wrist APB 5.0 ?4.4 2.5 ?4.0 100 Wrist - APB 7   15.1     Upper arm APB 9.3  2.5  102 Upper arm - Wrist 25 57 ?49 15.7  L Median - APB     Wrist APB 7.0 ?4.4 0.3 ?4.0 100 Wrist - APB 7   1.3     Upper arm APB 10.2  4.9  1585 Upper arm - Wrist 28 88 ?49 28.4  R Ulnar - ADM     Wrist ADM 3.6 ?3.3 5.4 ?6.0 100 Wrist - ADM 7   25.4     B.Elbow ADM 5.4  7.6  142 B.Elbow - Wrist 11 61 ?49 32.7     A.Elbow ADM 11.0  6.0  79 A.Elbow - B.Elbow 21 37 ?49 30.6  L Ulnar - ADM     Wrist ADM 3.5 ?3.3 9.3 ?6.0 100 Wrist - ADM 7   37.6     B.Elbow ADM 5.6  9.3  100 B.Elbow - Wrist 12 56 ?49 36.7     A.Elbow ADM 9.9  8.0  85.9 A.Elbow - B.Elbow 17 40 ?49 33.7             SNC    Nerve / Sites Rec. Site Peak Lat Ref.  Amp Ref. Segments Distance    ms ms V V  cm  R Radial - Anatomical snuff box (Forearm)     Forearm Wrist 3.0 ?2.9 22 ?15 Forearm - Wrist 10  L Radial - Anatomical snuff box (Forearm)     Forearm Wrist 2.8 ?2.9 31 ?15 Forearm - Wrist 10  R Median - Orthodromic (Dig II, Mid palm)     Dig II Wrist 4.2 ?3.4 10 ?10 Dig II - Wrist 13  L Median - Orthodromic (Dig II, Mid palm)     Dig II Wrist 4.0 ?3.4 10 ?10 Dig II - Wrist 13  R Ulnar - Orthodromic, (Dig V, Mid palm)     Dig V Wrist 3.7 ?3.1 5 ?5 Dig V - Wrist 11  L Ulnar - Orthodromic, (Dig V, Mid palm)     Dig V Wrist 3.7 ?3.1 4 ?5 Dig V - Wrist 30                 F  Wave    Nerve F Lat Ref.   ms ms  R Ulnar - ADM 34.1 ?32.0  L Ulnar - ADM 32.7 ?32.0         EMG Summary Table    Spontaneous MUAP  Recruitment  Muscle IA Fib PSW Fasc Other Amp Dur. Poly Pattern  R. Cervical paraspinals Normal None None None _______ Normal Normal Normal Normal  R. Infraspinatus Normal None None None _______ Normal Normal Normal Normal  R. Supraspinatus Normal None None None _______ Normal Normal Normal Normal  R. Deltoid Normal None None None _______ Normal Normal Normal Normal  R. Biceps brachii Normal None None None _______ Normal Normal Normal Normal  R. Triceps brachii Normal None None None _______ Normal Normal Normal Normal  R. Unspecified Muscle Normal None None None _______ Normal Normal Normal Normal  L. Biceps brachii (short head) Normal None None None _______ Normal Normal Normal Normal  L. Brachialis Normal None None None _______ Normal Normal Normal Normal  L. Deltoid Normal None None None _______ Normal Normal Normal Normal  L. Triceps brachii Normal None None None _______ Normal Normal Normal Normal  L. Pectoralis major (Clavicular head) Normal None None None _______ Normal Normal Normal Normal  R. Pectoralis major (Clavicular head) Normal None None None _______ Normal Normal Normal Normal  R. Trapezius Increased None None None _______ Increased Increased 1+ Reduced

## 2023-05-27 NOTE — Progress Notes (Addendum)
ASSESSMENT AND PLAN  Spencer Mendoza is a 62 y.o. male   Right shoulder pain, postsurgical change, limited range of motion,  EMG nerve conduction study today showed no evidence of  right axillary neuropathy or the right brachial plexopathy,  DIAGNOSTIC DATA (LABS, IMAGING, TESTING) - I reviewed patient records, labs, notes, testing and imaging myself where available.   MEDICAL HISTORY:  Spencer Mendoza is a 62 year old male,, seen in request by morphine white male orthopedic specialist Dr.Dax Everardo Pacific for EMG nerve conduction study to rule out right axillary neuropathy and right brachial plexopathy.  I reviewed and summarized the referring note. PMHX. Seizure Bipolar, suicide attempts in the past by overdoes on trazodone in Oct 2022 Alcohol abuse Chronic headche.  Patient was seen by Dr. Teresa Coombs in our clinic for epilepsy, he lives with his sister, long history of alcohol abuse, also have suicidal attempt overdose on trazodone October 2022  Taking Keppra 1000 mg twice a day for generalized tonic-clonic seizure  He underwent surgery for massive right to rotator cuff tear on December 04, 2021, had a seizure in early May 2023, fell forward on his right arm, since then he has worsening pain in the right shoulder, cannot use his shoulder likely he did before the seizure  He had right reverse lateralized total shoulder arthroplasty by Dr.Dax Everardo Pacific on Mar 27, 2022.  But right shoulder surgery failed to improve his pain, today's visit he guard his right shoulder, complains of severe pain, per referring note, he is referred for EMG nerve conduction study to evaluate to right axillary nerve injury versus brachial plexopathy  Personally reviewed MRI of the brain that was normal October 2023  Ultrasound of carotid artery less than 49% stenosis bilateral internal carotid artery, anterograde flow of bilateral vertebral artery.  EMG nerve conduction study today showed no evidence of  right axillary neuropathy or right brachial plexopathy.  PHYSICAL EXAM: 143/76, HR 58  PHYSICAL EXAMNIATION:  Gen: NAD, conversant, well nourised, well groomed                     Cardiovascular: Regular rate rhythm, no peripheral edema, warm, nontender. Eyes: Conjunctivae clear without exudates or hemorrhage Neck: Supple, no carotid bruits. Pulmonary: Clear to auscultation bilaterally   NEUROLOGICAL EXAM:  MENTAL STATUS: Speech/cognition: Awake, alert, oriented to history taking and casual conversation CRANIAL NERVES: CN II: Visual fields are full to confrontation. Pupils are round equal and briskly reactive to light. CN III, IV, VI: extraocular movement are normal. No ptosis. CN V: Facial sensation is intact to light touch CN VII: Face is symmetric with normal eye closure  CN VIII: Hearing is normal to causal conversation. CN IX, X: Phonation is normal. CN XI: Head turning and shoulder shrug are intact  MOTOR: Postsurgical change of right shoulder, limited range of motion, motor examination is limited due to pain, felt to there was no significant weakness below the right elbow  no significant left upper extremity, or bilateral lower extremity muscle weakness,  REFLEXES: Reflexes are 1 and symmetric at the biceps, triceps, knees, and ankles. Plantar responses are flexor.  SENSORY: Intact to light touch, pinprick and vibratory sensation are intact in fingers and toes.  COORDINATION: There is no trunk or limb dysmetria noted.  GAIT/STANCE: Posture is normal. Gait is steady   REVIEW OF SYSTEMS:  Full 14 system review of systems performed and notable only for as above All other review of systems were negative.  ALLERGIES: Allergies  Allergen Reactions   Sertraline Other (See Comments)    Erectile dysfunction   Codeine Nausea And Vomiting   Depakote [Divalproex Sodium] Other (See Comments)    Made patient shake   Migranal [Dihydroergotamine] Nausea And Vomiting    Topamax [Topiramate] Other (See Comments)    Made patient angry    HOME MEDICATIONS: Current Outpatient Medications  Medication Sig Dispense Refill   acetaminophen (TYLENOL) 500 MG tablet Take 1,000 mg by mouth 2 (two) times daily as needed (pain).     acidophilus (RISAQUAD) CAPS capsule Take 1 capsule by mouth daily. (Patient not taking: Reported on 04/20/2023)     albuterol (VENTOLIN HFA) 108 (90 Base) MCG/ACT inhaler INHALE 2 PUFFS BY MOUTH INTO THE LUNGS EVERY 4 HOURS AS NEEDED FOR SHORTNESS OF BREATH OR WHEEZING 8.5 g 5   aspirin EC 81 MG tablet Take 1 tablet (81 mg total) by mouth daily. Swallow whole. 90 tablet 3   budesonide-formoterol (SYMBICORT) 160-4.5 MCG/ACT inhaler Inhale 2 puffs into the lungs 2 (two) times daily. 10.2 g 5   dexamethasone (DECADRON) 2 MG tablet Start taking 3 tablets, then take 2, then take 1 (Patient not taking: Reported on 04/20/2023) 6 tablet 0   diclofenac (VOLTAREN) 75 MG EC tablet Take 1 tablet (75 mg total) by mouth 2 (two) times daily. (Patient not taking: Reported on 01/29/2023) 60 tablet 5   levETIRAcetam (KEPPRA) 1000 MG tablet Take 1 tablet (1,000 mg total) by mouth 2 (two) times daily. (Patient taking differently: Take 1,000 mg by mouth 2 (two) times daily. Taking 500mg  QAM, 1000mg  QHS) 180 tablet 3   midodrine (PROAMATINE) 2.5 MG tablet Take 1 tablet (2.5 mg total) by mouth 2 (two) times daily with a meal. (Patient not taking: Reported on 01/29/2023) 180 tablet 1   nicotine (NICODERM CQ - DOSED IN MG/24 HOURS) 21 mg/24hr patch Place 1 patch (21 mg total) onto the skin daily. (Patient not taking: Reported on 12/11/2022) 28 patch 0   ondansetron (ZOFRAN-ODT) 4 MG disintegrating tablet Take 1 tablet (4 mg total) by mouth every 8 (eight) hours as needed for nausea or vomiting. 20 tablet 6   rizatriptan (MAXALT-MLT) 10 MG disintegrating tablet Take 1 tablet (10 mg total) by mouth as needed for migraine. May repeat in 2 hours if needed (Patient not taking: Reported  on 04/20/2023) 9 tablet 11   rosuvastatin (CRESTOR) 10 MG tablet TAKE 1 TABLET(10 MG) BY MOUTH DAILY 90 tablet 2   thiamine (VITAMIN B1) 100 MG tablet Take 50 mg by mouth daily.     traZODone (DESYREL) 100 MG tablet TAKE 1 TO 2 TABLETS(100 TO 200 MG) BY MOUTH AT BEDTIME 60 tablet 2   zonisamide (ZONEGRAN) 100 MG capsule Take 2 capsules (200 mg total) by mouth daily. 60 capsule 6   No current facility-administered medications for this visit.    PAST MEDICAL HISTORY: Past Medical History:  Diagnosis Date   Abdominal pain 08/30/2019   Alcohol withdrawal (HCC) 07/29/2013   Allergy    Anxiety    Arthritis    Benzodiazepine abuse (HCC) 08/30/2019   Bipolar disorder (HCC)    CAD (coronary artery disease)    Carotid arterial disease (HCC)    Chronic insomnia 06/06/2015   Chronic low back pain 12/28/2013   Chronic migraine without aura, with intractable migraine, so stated, with status migrainosus 08/25/2019   Claustrophobia    Cocaine abuse (HCC) 05/22/2015   COPD (chronic obstructive pulmonary disease) (HCC)    Delirium  tremens (HCC) 07/29/2013   Epilepsy (HCC)    last seizure week of 06-25-2020- sev. grand mal seizures per pt    Hemiplegic migraine with status migrainosus 06/06/2015   Migraine    Opioid use disorder 08/30/2019   PAD (peripheral artery disease) (HCC)    PONV (postoperative nausea and vomiting)    Rib fractures 09/07/2022   Stroke [facility])    patient denies- states had a hemiplegic migraine in ~2011   Substance abuse (HCC)     PAST SURGICAL HISTORY: Past Surgical History:  Procedure Laterality Date   ANKLE SURGERY     in high school   APPENDECTOMY     COLONOSCOPY     KNEE ARTHROSCOPY Right    NOSE SURGERY     REVERSE SHOULDER ARTHROPLASTY Right 03/27/2022   Procedure: REVERSE SHOULDER ARTHROPLASTY;  Surgeon: Bjorn Pippin, MD;  Location: [facility];  Service: Orthopedics;  Laterality: Right;   SHOULDER ARTHROSCOPY WITH DISTAL CLAVICLE RESECTION  Right 12/04/2021   Procedure: SHOULDER ARTHROSCOPY WITH DISTAL CLAVICLE EXCISION;  Surgeon: Bjorn Pippin, MD;  Location: [facility];  Service: Orthopedics;  Laterality: Right;   SHOULDER ARTHROSCOPY WITH SUBACROMIAL DECOMPRESSION, ROTATOR CUFF REPAIR AND BICEP TENDON REPAIR Right 12/04/2021   Procedure: SHOULDER ARTHROSCOPY WITH SUBACROMIAL DECOMPRESSION, ROTATOR CUFF REPAIR AND BICEP TENDON REPAIR;  Surgeon: Bjorn Pippin, MD;  Location: [facility];  Service: Orthopedics;  Laterality: Right;    FAMILY HISTORY: Family History  Problem Relation Age of Onset   Heart disease Mother    Stroke Mother    Epilepsy Mother    Heart failure Mother    Cancer Father    Heart disease Father    Hypertension Father    Melanoma Father    Migraines Father    Colon cancer Sister 55   Colon cancer Cousin    Cancer Cousin    Colon polyps Neg Hx    Esophageal cancer Neg Hx    Rectal cancer Neg Hx    Stomach cancer Neg Hx     SOCIAL HISTORY: Social History   Socioeconomic History   Marital status: Legally Separated    Spouse name: Not on file   Number of children: 0   Years of education: GED   Highest education level: Not on file  Occupational History   Occupation: disabled  Tobacco Use   Smoking status: Every Day    Packs/day: 0.25    Years: 15.00    Additional pack years: 0.00    Total pack years: 3.75    Types: Cigarettes   Smokeless tobacco: Never   Tobacco comments:    States he quit 02-25-22  Vaping Use   Vaping Use: Never used  Substance and Sexual Activity   Alcohol use: Yes    Alcohol/week: 6.0 standard drinks of alcohol    Types: 6 Cans of beer per week    Comment: one a night   Drug use: Yes    Types: Marijuana    Comment: daily   Sexual activity: Yes    Birth control/protection: Condom  Other Topics Concern   Not on file  Social History Narrative   Pt lives with sister.   Patient drinks 2 cups of caffeine daily.   Patient is right  handed.   Social Determinants of Health   Financial Resource Strain: Low Risk  (08/01/2022)   Overall Financial Resource Strain (CARDIA)    Difficulty of Paying Living Expenses: Not hard at all  Food Insecurity: No Food Insecurity (04/20/2023)   Hunger Vital Sign    Worried About Running Out of Food in the Last Year: Never true    Ran Out of Food in the Last Year: Never true  Transportation Needs: No Transportation Needs (04/21/2023)   PRAPARE - Administrator, Civil Service (Medical): No    Lack of Transportation (Non-Medical): No  Physical Activity: Inactive (08/01/2022)   Exercise Vital Sign    Days of Exercise per Week: 0 days    Minutes of Exercise per Session: 0 min  Stress: Stress Concern Present (08/01/2022)   [employer] of Occupational Health - Occupational Stress Questionnaire    Feeling of Stress : Rather much  Social Connections: Unknown (08/01/2022)   Social Connection and Isolation Panel [NHANES]    Frequency of Communication with Friends and Family: More than three times a week    Frequency of Social Gatherings with Friends and Family: More than three times a week    Attends Religious Services: More than 4 times per year    Active Member of [employer] or Organizations: Not on file    Attends Banker Meetings: Not on file    Marital Status: Separated  Intimate Partner Violence: Not At Risk (09/08/2022)   Humiliation, Afraid, Rape, and Kick questionnaire    Fear of Current or Ex-Partner: No    Emotionally Abused: No    Physically Abused: No    Sexually Abused: No      Levert Feinstein, M.D. Ph.D.  [facility] [address], [address] [place], [place] [address] Ph: [phone] Fax: [phone]  CC:  Dulce Sellar, NP [address] [place],  [place] [address]  Dulce Sellar, NP

## 2023-05-28 ENCOUNTER — Telehealth: Payer: Self-pay | Admitting: Neurology

## 2023-05-28 NOTE — Telephone Encounter (Signed)
Cohere Berkley Harvey: [id number] exp. 05/28/23-07/27/23 sent to [facility] [phone]

## 2023-06-02 DIAGNOSIS — M19011 Primary osteoarthritis, right shoulder: Secondary | ICD-10-CM | POA: Diagnosis not present

## 2023-06-03 ENCOUNTER — Other Ambulatory Visit: Payer: Self-pay | Admitting: Family

## 2023-06-03 DIAGNOSIS — J4489 Other specified chronic obstructive pulmonary disease: Secondary | ICD-10-CM

## 2023-06-11 ENCOUNTER — Ambulatory Visit: Payer: Medicare HMO | Admitting: Family

## 2023-06-11 NOTE — Progress Notes (Deleted)
Patient ID: Spencer Mendoza, male    DOB: 08/29/1961, 62 y.o.   MRN: [id number]  No chief complaint on file.   HPI: INSOMNIA:  How long: years. Difficulty initiating sleep: yes.  Difficulty maintaining sleep: yes.  OTC meds tried: no. RX meds in past: Remeron, Trazodone, Valium, Seroquel, Phenobarbitol. Sleep hygiene measures: yes. Started any new meds recently: no. Shift worker: no. New stressors: yes, his sister, Velna Hatchet died recently. Currently taking Trazodone with relief.  Assessment & Plan:  Chronic insomnia  COPD with chronic bronchitis    Subjective:    Outpatient Medications Prior to Visit  Medication Sig Dispense Refill   acetaminophen (TYLENOL) 500 MG tablet Take 1,000 mg by mouth 2 (two) times daily as needed (pain).     acidophilus (RISAQUAD) CAPS capsule Take 1 capsule by mouth daily. (Patient not taking: Reported on 04/20/2023)     albuterol (VENTOLIN HFA) 108 (90 Base) MCG/ACT inhaler INHALE 2 PUFFS BY MOUTH EVERY 4 HOURS AS NEEDED FOR SHORTNESS OF BREATH OR WHEEZING 8.5 g 5   aspirin EC 81 MG tablet Take 1 tablet (81 mg total) by mouth daily. Swallow whole. 90 tablet 3   levETIRAcetam (KEPPRA) 1000 MG tablet Take 1 tablet (1,000 mg total) by mouth 2 (two) times daily. (Patient taking differently: Take 1,000 mg by mouth 2 (two) times daily. Taking 500mg  QAM, 1000mg  QHS) 180 tablet 3   midodrine (PROAMATINE) 2.5 MG tablet Take 1 tablet (2.5 mg total) by mouth 2 (two) times daily with a meal. (Patient not taking: Reported on 01/29/2023) 180 tablet 1   nicotine (NICODERM CQ - DOSED IN MG/24 HOURS) 21 mg/24hr patch Place 1 patch (21 mg total) onto the skin daily. (Patient not taking: Reported on 12/11/2022) 28 patch 0   ondansetron (ZOFRAN-ODT) 4 MG disintegrating tablet Take 1 tablet (4 mg total) by mouth every 8 (eight) hours as needed for nausea or vomiting. 20 tablet 6   rizatriptan (MAXALT-MLT) 10 MG disintegrating tablet Take 1 tablet (10 mg total) by mouth as needed for  migraine. May repeat in 2 hours if needed (Patient not taking: Reported on 04/20/2023) 9 tablet 11   rosuvastatin (CRESTOR) 10 MG tablet TAKE 1 TABLET(10 MG) BY MOUTH DAILY 90 tablet 2   SYMBICORT 160-4.5 MCG/ACT inhaler INHALE 2 PUFFS INTO THE LUNGS TWICE DAILY 10.2 g 5   thiamine (VITAMIN B1) 100 MG tablet Take 50 mg by mouth daily.     traZODone (DESYREL) 100 MG tablet TAKE 1 TO 2 TABLETS(100 TO 200 MG) BY MOUTH AT BEDTIME 60 tablet 2   zonisamide (ZONEGRAN) 100 MG capsule Take 2 capsules (200 mg total) by mouth daily. 60 capsule 6   dexamethasone (DECADRON) 2 MG tablet Start taking 3 tablets, then take 2, then take 1 (Patient not taking: Reported on 04/20/2023) 6 tablet 0   diclofenac (VOLTAREN) 75 MG EC tablet Take 1 tablet (75 mg total) by mouth 2 (two) times daily. (Patient not taking: Reported on 01/29/2023) 60 tablet 5   No facility-administered medications prior to visit.   Past Medical History:  Diagnosis Date   Abdominal pain 08/30/2019   Alcohol withdrawal (HCC) 07/29/2013   Allergy    Anxiety    Arthritis    Benzodiazepine abuse (HCC) 08/30/2019   Bipolar disorder (HCC)    CAD (coronary artery disease)    Carotid arterial disease (HCC)    Chronic insomnia 06/06/2015   Chronic low back pain 12/28/2013   Chronic migraine without aura, with intractable  migraine, so stated, with status migrainosus 08/25/2019   Claustrophobia    Cocaine abuse (HCC) 05/22/2015   COPD (chronic obstructive pulmonary disease) (HCC)    Delirium tremens (HCC) 07/29/2013   Epilepsy (HCC)    last seizure week of 06-25-2020- sev. grand mal seizures per pt    Hemiplegic migraine with status migrainosus 06/06/2015   Migraine    Opioid use disorder 08/30/2019   PAD (peripheral artery disease) (HCC)    PONV (postoperative nausea and vomiting)    Rib fractures 09/07/2022   Stroke [facility])    patient denies- states had a hemiplegic migraine in ~2011   Substance abuse (HCC)    Past Surgical History:   Procedure Laterality Date   ANKLE SURGERY     in high school   APPENDECTOMY     COLONOSCOPY     KNEE ARTHROSCOPY Right    NOSE SURGERY     REVERSE SHOULDER ARTHROPLASTY Right 03/27/2022   Procedure: REVERSE SHOULDER ARTHROPLASTY;  Surgeon: Bjorn Pippin, MD;  Location: [facility];  Service: Orthopedics;  Laterality: Right;   SHOULDER ARTHROSCOPY WITH DISTAL CLAVICLE RESECTION Right 12/04/2021   Procedure: SHOULDER ARTHROSCOPY WITH DISTAL CLAVICLE EXCISION;  Surgeon: Bjorn Pippin, MD;  Location: [facility];  Service: Orthopedics;  Laterality: Right;   SHOULDER ARTHROSCOPY WITH SUBACROMIAL DECOMPRESSION, ROTATOR CUFF REPAIR AND BICEP TENDON REPAIR Right 12/04/2021   Procedure: SHOULDER ARTHROSCOPY WITH SUBACROMIAL DECOMPRESSION, ROTATOR CUFF REPAIR AND BICEP TENDON REPAIR;  Surgeon: Bjorn Pippin, MD;  Location: [facility];  Service: Orthopedics;  Laterality: Right;   Allergies  Allergen Reactions   Sertraline Other (See Comments)    Erectile dysfunction   Codeine Nausea And Vomiting   Depakote [Divalproex Sodium] Other (See Comments)    Made patient shake   Migranal [Dihydroergotamine] Nausea And Vomiting   Topamax [Topiramate] Other (See Comments)    Made patient angry      Objective:    Physical Exam Vitals and nursing note reviewed.  Constitutional:      General: He is not in acute distress.    Appearance: Normal appearance.  HENT:     Head: Normocephalic.  Cardiovascular:     Rate and Rhythm: Normal rate and regular rhythm.  Pulmonary:     Effort: Pulmonary effort is normal.     Breath sounds: Normal breath sounds.  Musculoskeletal:        General: Normal range of motion.     Cervical back: Normal range of motion.  Skin:    General: Skin is warm and dry.  Neurological:     Mental Status: He is alert and oriented to person, place, and time.  Psychiatric:        Mood and Affect: Mood normal.    There were no vitals taken  for this visit. Wt Readings from Last 3 Encounters:  05/27/23 159 lb (72.1 kg)  01/29/23 159 lb (72.1 kg)  12/11/22 154 lb 2 oz (69.9 kg)       Dulce Sellar, NP

## 2023-07-02 ENCOUNTER — Other Ambulatory Visit: Payer: Self-pay | Admitting: Family

## 2023-07-02 ENCOUNTER — Encounter (INDEPENDENT_AMBULATORY_CARE_PROVIDER_SITE_OTHER): Payer: Self-pay

## 2023-07-02 DIAGNOSIS — F5104 Psychophysiologic insomnia: Secondary | ICD-10-CM

## 2023-08-04 ENCOUNTER — Encounter: Payer: Self-pay | Admitting: Gastroenterology

## 2023-09-07 ENCOUNTER — Other Ambulatory Visit: Payer: Self-pay | Admitting: Neurology

## 2023-09-30 ENCOUNTER — Other Ambulatory Visit: Payer: Self-pay | Admitting: Family

## 2023-09-30 ENCOUNTER — Ambulatory Visit: Payer: Medicare HMO

## 2023-09-30 VITALS — Wt 159.0 lb

## 2023-09-30 DIAGNOSIS — F5104 Psychophysiologic insomnia: Secondary | ICD-10-CM

## 2023-09-30 DIAGNOSIS — Z0000 Encounter for general adult medical examination without abnormal findings: Secondary | ICD-10-CM

## 2023-09-30 NOTE — Patient Instructions (Signed)
[name] , Thank you for taking time to come for your Medicare Wellness Visit. I appreciate your ongoing commitment to your health goals. Please review the following plan we discussed and let me know if I can assist you in the future.   Referrals/Orders/Follow-Ups/Clinician Recommendations: If you wish to quit smoking, help is available. For free tobacco cessation program offerings call the [facility] at (906) 141-5920 or Live Well Line at 778-687-1971. You may also visit [email or web address] or email livelifewell@[place] .com for more information on other programs.   You may also call 1-800-QUIT-NOW ((971) 084-0519) or visit www.[place].ch or www.[facility].org for additional resources on smoking cessation.   Aim for 30 minutes of exercise or brisk walking, 6-8 glasses of water, and 5 servings of fruits and vegetables each day.   This is a list of the screening recommended for you and due dates:  Health Maintenance  Topic Date Due   DTaP/Tdap/Td vaccine (1 - Tdap) Never done   Zoster (Shingles) Vaccine (1 of 2) Never done   Flu Shot  06/18/2023   Colon Cancer Screening  07/28/2023   Medicare Annual Wellness Visit  08/02/2023   Hepatitis C Screening  Completed   HIV Screening  Completed   HPV Vaccine  Aged Out   COVID-19 Vaccine  Discontinued    Advanced directives: (Provided) Advance directive discussed with you today. I have provided a copy for you to complete at home and have notarized. Once this is complete, please bring a copy in to our office so we can scan it into your chart.   Next Medicare Annual Wellness Visit scheduled for next year: Yes

## 2023-09-30 NOTE — Progress Notes (Signed)
Subjective:   Spencer Mendoza is a 62 y.o. male who presents for Medicare Annual/Subsequent preventive examination.  Visit Complete: Virtual I connected with  [name] on 09/30/23 by a audio enabled telemedicine application and verified that I am speaking with the correct person using two identifiers.  Patient Location: Home  Provider Location: Home Office  I discussed the limitations of evaluation and management by telemedicine. The patient expressed understanding and agreed to proceed.  Vital Signs: Because this visit was a virtual/telehealth visit, some criteria may be missing or patient reported. Any vitals not documented were not able to be obtained and vitals that have been documented are patient reported.   Cardiac Risk Factors include: advanced age (>91men, >95 women);hypertension;male gender     Objective:    Today's Vitals   09/30/23 1020  Weight: 159 lb (72.1 kg)   Body mass index is 21.56 kg/m.     09/30/2023   10:26 AM 09/08/2022    1:00 AM 09/07/2022   12:29 PM 08/01/2022    3:21 PM 07/02/2022   11:29 AM 06/26/2022   12:44 PM 05/01/2022    9:11 AM  Advanced Directives  Does Patient Have a Medical Advance Directive? Yes No No Yes No No No  Type of Estate agent of [place];Living will   Healthcare Power of [place];Living will     Does patient want to make changes to medical advance directive? No - Patient declined        Copy of Healthcare Power of Attorney in Chart? Yes - validated most recent copy scanned in chart (See row information)   No - copy requested     Would patient like information on creating a medical advance directive?  No - Patient declined No - Patient declined   No - Patient declined No - Patient declined    Current Medications (verified) Outpatient Encounter Medications as of 09/30/2023  Medication Sig   acetaminophen (TYLENOL) 500 MG tablet Take 1,000 mg by mouth 2 (two) times daily as needed (pain).    albuterol (VENTOLIN HFA) 108 (90 Base) MCG/ACT inhaler INHALE 2 PUFFS BY MOUTH EVERY 4 HOURS AS NEEDED FOR SHORTNESS OF BREATH OR WHEEZING   aspirin EC 81 MG tablet Take 1 tablet (81 mg total) by mouth daily. Swallow whole.   levETIRAcetam (KEPPRA) 1000 MG tablet Take 1 tablet (1,000 mg total) by mouth 2 (two) times daily. (Patient taking differently: Take 1,000 mg by mouth 2 (two) times daily. Taking 500mg  QAM, 1000mg  QHS)   ondansetron (ZOFRAN-ODT) 4 MG disintegrating tablet Take 1 tablet (4 mg total) by mouth every 8 (eight) hours as needed for nausea or vomiting.   Probiotic Product (PROBIOTIC BLEND PO) Take by mouth.   rosuvastatin (CRESTOR) 10 MG tablet TAKE 1 TABLET(10 MG) BY MOUTH DAILY   SYMBICORT 160-4.5 MCG/ACT inhaler INHALE 2 PUFFS INTO THE LUNGS TWICE DAILY   thiamine (VITAMIN B1) 100 MG tablet Take 50 mg by mouth daily.   traZODone (DESYREL) 100 MG tablet TAKE 1 TO 2 TABLETS(100 TO 200 MG) BY MOUTH AT BEDTIME   zonisamide (ZONEGRAN) 100 MG capsule TAKE 2 CAPSULES(200 MG) BY MOUTH DAILY   midodrine (PROAMATINE) 2.5 MG tablet Take 1 tablet (2.5 mg total) by mouth 2 (two) times daily with a meal. (Patient not taking: Reported on 01/29/2023)   [DISCONTINUED] acidophilus (RISAQUAD) CAPS capsule Take 1 capsule by mouth daily. (Patient not taking: Reported on 04/20/2023)   [DISCONTINUED] nicotine (NICODERM CQ - DOSED IN MG/24  HOURS) 21 mg/24hr patch Place 1 patch (21 mg total) onto the skin daily. (Patient not taking: Reported on 12/11/2022)   [DISCONTINUED] rizatriptan (MAXALT-MLT) 10 MG disintegrating tablet Take 1 tablet (10 mg total) by mouth as needed for migraine. May repeat in 2 hours if needed   No facility-administered encounter medications on file as of 09/30/2023.    Allergies (verified) Sertraline, Codeine, Depakote [divalproex sodium], Migranal [dihydroergotamine], and Topamax [topiramate]   History: Past Medical History:  Diagnosis Date   Abdominal pain 08/30/2019   Alcohol  withdrawal (HCC) 07/29/2013   Allergy    Anxiety    Arthritis    Benzodiazepine abuse (HCC) 08/30/2019   Bipolar disorder (HCC)    CAD (coronary artery disease)    Carotid arterial disease (HCC)    Chronic insomnia 06/06/2015   Chronic low back pain 12/28/2013   Chronic migraine without aura, with intractable migraine, so stated, with status migrainosus 08/25/2019   Claustrophobia    Cocaine abuse (HCC) 05/22/2015   COPD (chronic obstructive pulmonary disease) (HCC)    Delirium tremens (HCC) 07/29/2013   Epilepsy (HCC)    last seizure week of 06-25-2020- sev. grand mal seizures per pt    Hemiplegic migraine with status migrainosus 06/06/2015   Migraine    Opioid use disorder 08/30/2019   PAD (peripheral artery disease) (HCC)    PONV (postoperative nausea and vomiting)    Rib fractures 09/07/2022   Stroke [facility])    patient denies- states had a hemiplegic migraine in ~2011   Substance abuse (HCC)    Past Surgical History:  Procedure Laterality Date   ANKLE SURGERY     in high school   APPENDECTOMY     COLONOSCOPY     KNEE ARTHROSCOPY Right    NOSE SURGERY     REVERSE SHOULDER ARTHROPLASTY Right 03/27/2022   Procedure: REVERSE SHOULDER ARTHROPLASTY;  Surgeon: Bjorn Pippin, MD;  Location: [facility];  Service: Orthopedics;  Laterality: Right;   SHOULDER ARTHROSCOPY WITH DISTAL CLAVICLE RESECTION Right 12/04/2021   Procedure: SHOULDER ARTHROSCOPY WITH DISTAL CLAVICLE EXCISION;  Surgeon: Bjorn Pippin, MD;  Location: [facility];  Service: Orthopedics;  Laterality: Right;   SHOULDER ARTHROSCOPY WITH SUBACROMIAL DECOMPRESSION, ROTATOR CUFF REPAIR AND BICEP TENDON REPAIR Right 12/04/2021   Procedure: SHOULDER ARTHROSCOPY WITH SUBACROMIAL DECOMPRESSION, ROTATOR CUFF REPAIR AND BICEP TENDON REPAIR;  Surgeon: Bjorn Pippin, MD;  Location: [facility];  Service: Orthopedics;  Laterality: Right;   Family History  Problem Relation Age of Onset    Heart disease Mother    Stroke Mother    Epilepsy Mother    Heart failure Mother    Cancer Father    Heart disease Father    Hypertension Father    Melanoma Father    Migraines Father    Colon cancer Sister 89   Colon cancer Cousin    Cancer Cousin    Colon polyps Neg Hx    Esophageal cancer Neg Hx    Rectal cancer Neg Hx    Stomach cancer Neg Hx    Social History   Socioeconomic History   Marital status: Legally Separated    Spouse name: Not on file   Number of children: 0   Years of education: GED   Highest education level: Not on file  Occupational History   Occupation: disabled  Tobacco Use   Smoking status: Every Day    Current packs/day: 0.25    Average packs/day: 0.3 packs/day for  15.0 years (3.8 ttl pk-yrs)    Types: Cigarettes   Smokeless tobacco: Never   Tobacco comments:    States he quit 02-25-22  Vaping Use   Vaping status: Never Used  Substance and Sexual Activity   Alcohol use: Yes    Alcohol/week: 6.0 standard drinks of alcohol    Types: 6 Cans of beer per week    Comment: one a night   Drug use: Yes    Types: Marijuana    Comment: daily   Sexual activity: Yes    Birth control/protection: Condom  Other Topics Concern   Not on file  Social History Narrative   Pt lives with sister.   Patient drinks 2 cups of caffeine daily.   Patient is right handed.   Social Determinants of Health   Financial Resource Strain: Low Risk  (09/30/2023)   Overall Financial Resource Strain (CARDIA)    Difficulty of Paying Living Expenses: Not hard at all  Food Insecurity: No Food Insecurity (09/30/2023)   Hunger Vital Sign    Worried About Running Out of Food in the Last Year: Never true    Ran Out of Food in the Last Year: Never true  Transportation Needs: Unmet Transportation Needs (09/30/2023)   PRAPARE - Administrator, Civil Service (Medical): Yes    Lack of Transportation (Non-Medical): Yes  Physical Activity: Sufficiently Active (09/30/2023)    Exercise Vital Sign    Days of Exercise per Week: 5 days    Minutes of Exercise per Session: 30 min  Stress: No Stress Concern Present (09/30/2023)   [employer] of Occupational Health - Occupational Stress Questionnaire    Feeling of Stress : Not at all  Social Connections: Moderately Isolated (09/30/2023)   Social Connection and Isolation Panel [NHANES]    Frequency of Communication with Friends and Family: More than three times a week    Frequency of Social Gatherings with Friends and Family: More than three times a week    Attends Religious Services: 1 to 4 times per year    Active Member of [employer] or Organizations: No    Attends Banker Meetings: Never    Marital Status: Separated    Tobacco Counseling Ready to quit: Not Answered Counseling given: Not Answered Tobacco comments: States he quit 02-25-22   Clinical Intake:  Pre-visit preparation completed: Yes  Pain : No/denies pain     BMI - recorded: 21.56 Nutritional Status: BMI of 19-24  Normal Nutritional Risks: None Diabetes: No  How often do you need to have someone help you when you read instructions, pamphlets, or other written materials from your doctor or pharmacy?: 1 - Never  Interpreter Needed?: No  Information entered by :: Lanier Ensign, LPN   Activities of Daily Living    09/30/2023   10:21 AM  In your present state of health, do you have any difficulty performing the following activities:  Hearing? 0  Vision? 0  Difficulty concentrating or making decisions? 0  Walking or climbing stairs? 0  Dressing or bathing? 0  Doing errands, shopping? 0  Preparing Food and eating ? N  Using the Toilet? N  In the past six months, have you accidently leaked urine? N  Do you have problems with loss of bowel control? N  Managing your Medications? N  Managing your Finances? N  Housekeeping or managing your Housekeeping? N    Patient Care Team: Dulce Sellar, NP as PCP - General  (Family Medicine) Donato Schultz  C, MD as PCP - Cardiology (Cardiology) Bjorn Pippin, MD as Consulting Physician (Orthopedic Surgery)  Indicate any recent Medical Services you may have received from other than [facility] providers in the past year (date may be approximate).     Assessment:   This is a routine wellness examination for [place].  Hearing/Vision screen Hearing Screening - Comments:: Pt denies any hearing issues  Vision Screening - Comments:: Encouraged to follow up with provider    Goals Addressed             This Visit's Progress    Patient Stated       Cut back on smoking       Depression Screen    09/30/2023   10:27 AM 12/11/2022    2:14 PM 09/23/2022    1:11 PM 09/11/2022   11:04 AM 08/01/2022    3:18 PM 02/26/2022   10:44 AM 03/19/2021    1:45 PM  PHQ 2/9 Scores  PHQ - 2 Score 0 0 0 0 0 0 0  PHQ- 9 Score  0  0  0 0    Fall Risk    09/30/2023   10:32 AM 08/01/2022    3:22 PM 02/03/2022    9:01 AM 03/19/2021    1:45 PM 09/25/2020    9:37 AM  Fall Risk   Falls in the past year? 0 1 0 0 0  Number falls in past yr: 0 1 0 0 0  Injury with Fall? 0 1 0 0 0  Comment  fell off broken rail     Risk for fall due to : No Fall Risks Impaired balance/gait;Impaired mobility;Impaired vision No Fall Risks    Risk for fall due to: Comment  realted to fall     Follow up Falls prevention discussed Falls prevention discussed       MEDICARE RISK AT HOME: Medicare Risk at Home Any stairs in or around the home?: Yes If so, are there any without handrails?: No Home free of loose throw rugs in walkways, pet beds, electrical cords, etc?: Yes Adequate lighting in your home to reduce risk of falls?: Yes Life alert?: No Use of a cane, walker or w/c?: No Grab bars in the bathroom?: Yes Shower chair or bench in shower?: Yes Elevated toilet seat or a handicapped toilet?: No  TIMED UP AND GO:  Was the test performed?  No    Cognitive Function:    09/30/2023   10:33 AM  MMSE -  Mini Mental State Exam  Not completed: Refused        08/01/2022    3:27 PM  6CIT Screen  What Year? 0 points  What month? 0 points  What time? 0 points  Count back from 20 0 points  Months in reverse 0 points  Repeat phrase 0 points  Total Score 0 points    Immunizations Immunization History  Administered Date(s) Administered   Influenza,inj,Quad PF,6+ Mos 07/30/2013   Moderna Sars-Covid-2 Vaccination 09/28/2020   Pneumococcal Polysaccharide-23 04/22/2019    TDAP status: Due, Education has been provided regarding the importance of this vaccine. Advised may receive this vaccine at local pharmacy or Health Dept. Aware to provide a copy of the vaccination record if obtained from local pharmacy or Health Dept. Verbalized acceptance and understanding.  Flu Vaccine status: Due, Education has been provided regarding the importance of this vaccine. Advised may receive this vaccine at local pharmacy or Health Dept. Aware to provide a copy of the vaccination  record if obtained from local pharmacy or Health Dept. Verbalized acceptance and understanding.    Covid-19 vaccine status: Information provided on how to obtain vaccines.   Qualifies for Shingles Vaccine? Yes   Zostavax completed No   Shingrix Completed?: No.    Education has been provided regarding the importance of this vaccine. Patient has been advised to call insurance company to determine out of pocket expense if they have not yet received this vaccine. Advised may also receive vaccine at local pharmacy or Health Dept. Verbalized acceptance and understanding.  Screening Tests Health Maintenance  Topic Date Due   DTaP/Tdap/Td (1 - Tdap) Never done   Zoster Vaccines- Shingrix (1 of 2) Never done   Colonoscopy  07/28/2023   INFLUENZA VACCINE  02/15/2024 (Originally 06/18/2023)   Medicare Annual Wellness (AWV)  09/29/2024   Hepatitis C Screening  Completed   HIV Screening  Completed   HPV VACCINES  Aged Out   COVID-19  Vaccine  Discontinued    Health Maintenance  Health Maintenance Due  Topic Date Due   DTaP/Tdap/Td (1 - Tdap) Never done   Zoster Vaccines- Shingrix (1 of 2) Never done   Colonoscopy  07/28/2023    Pt postponed colonoscopy until better transportation   Lung Cancer Screening: (Low Dose CT Chest recommended if Age 53-80 years, 20 pack-year currently smoking OR have quit w/in 15years.) does qualify.   Lung Cancer Screening Referral: not at this time   Additional Screening:  Hepatitis C Screening: Completed 08/30/19  Vision Screening: Recommended annual ophthalmology exams for early detection of glaucoma and other disorders of the eye. Is the patient up to date with their annual eye exam?  No  Who is the provider or what is the name of the office in which the patient attends annual eye exams? Encouraged to follow up If pt is not established with a provider, would they like to be referred to a provider to establish care? No .   Dental Screening: Recommended annual dental exams for proper oral hygiene  Community Resource Referral / Chronic Care Management: CRR required this visit?  No   CCM required this visit?  No     Plan:     I have personally reviewed and noted the following in the patient's chart:   Medical and social history Use of alcohol, tobacco or illicit drugs  Current medications and supplements including opioid prescriptions. Patient is not currently taking opioid prescriptions. Functional ability and status Nutritional status Physical activity Advanced directives List of other physicians Hospitalizations, surgeries, and ER visits in previous 12 months Vitals Screenings to include cognitive, depression, and falls Referrals and appointments  In addition, I have reviewed and discussed with patient certain preventive protocols, quality metrics, and best practice recommendations. A written personalized care plan for preventive services as well as general  preventive health recommendations were provided to patient.     Marzella Schlein, LPN   [id number]   After Visit Summary: (Mail) Due to this being a telephonic visit, the after visit summary with patients personalized plan was offered to patient via mail   Nurse Notes: none

## 2023-10-13 ENCOUNTER — Other Ambulatory Visit: Payer: Self-pay | Admitting: Family

## 2023-10-13 DIAGNOSIS — F5104 Psychophysiologic insomnia: Secondary | ICD-10-CM

## 2023-10-20 ENCOUNTER — Encounter: Payer: Self-pay | Admitting: Family

## 2023-10-20 ENCOUNTER — Telehealth (INDEPENDENT_AMBULATORY_CARE_PROVIDER_SITE_OTHER): Payer: Medicare HMO | Admitting: Family

## 2023-10-20 VITALS — Ht 72.0 in

## 2023-10-20 DIAGNOSIS — Z8673 Personal history of transient ischemic attack (TIA), and cerebral infarction without residual deficits: Secondary | ICD-10-CM | POA: Diagnosis not present

## 2023-10-20 DIAGNOSIS — I700 Atherosclerosis of aorta: Secondary | ICD-10-CM | POA: Diagnosis not present

## 2023-10-20 DIAGNOSIS — F5104 Psychophysiologic insomnia: Secondary | ICD-10-CM | POA: Diagnosis not present

## 2023-10-20 DIAGNOSIS — J4489 Other specified chronic obstructive pulmonary disease: Secondary | ICD-10-CM | POA: Diagnosis not present

## 2023-10-20 DIAGNOSIS — R569 Unspecified convulsions: Secondary | ICD-10-CM

## 2023-10-20 MED ORDER — SYMBICORT 160-4.5 MCG/ACT IN AERO
2.0000 | INHALATION_SPRAY | Freq: Two times a day (BID) | RESPIRATORY_TRACT | Status: DC
Start: 2023-10-20 — End: 2024-06-06

## 2023-10-20 MED ORDER — TRAZODONE HCL 100 MG PO TABS
ORAL_TABLET | ORAL | 3 refills | Status: DC
Start: 2023-10-20 — End: 2024-02-29

## 2023-10-20 NOTE — Assessment & Plan Note (Addendum)
chronic, stable Continue aspirin and statin therapy  pt not followed by Cardiology will notify office with Crestor needs refill last lipids 03/2023 f/u 4mos fasting labs

## 2023-10-20 NOTE — Assessment & Plan Note (Signed)
chronic, stable Continue aspirin and statin therapy  pt not followed by Cardiology will notify office with Crestor needs refill last lipids 03/2023 f/u 4mos fasting labs

## 2023-10-20 NOTE — Progress Notes (Signed)
MyChart Video Visit    Virtual Visit via Video Note   This format is felt to be most appropriate for this patient at this time. Physical exam was limited by quality of the video and audio technology used for the visit. CMA was able to get the patient set up on a video visit.  Patient location: Home. Patient and provider in visit Provider location: Office  I discussed the limitations of evaluation and management by telemedicine and the availability of in person appointments. The patient expressed understanding and agreed to proceed.  Visit Date: 10/20/2023  Today's healthcare provider: Dulce Sellar, NP     Subjective:   Patient ID: Spencer Mendoza, male    DOB: 1961-05-31, 62 y.o.   MRN: [id number]  Chief Complaint  Patient presents with   Insomnia    Medication refill   HPI CVD:  pt w/hx of LAD blockage, last EF 60%, first degree HB, abdominal aortic atherosclerosis, hx of CVA, hx of bradycardia w/syncope, currently on Crestor and baby ASA, pt denies any recent sx.  COPD:  with bronchitis; chronic hx due to smoking, pt still smoking, using Symbicort bid and Albuterol prn, controlling sx.  Insomnia:  How long: years. Difficulty initiating sleep: yes.  Difficulty maintaining sleep: yes.  OTC meds tried: no. RX meds in past: Remeron, Trazodone, Valium, Seroquel, Phenobarbitol. Sleep hygiene measures: yes. Started any new meds recently: no. Shift worker: no. New stressors: yes, financial, living situation. Currently taking Trazodone with relief.  Assessment & Plan:  Chronic insomnia Assessment & Plan: Chronic, stable pt reports having problem since childhood tried many different medicines, all have interaction with his Keppra, except Trazodone he can take without side effects refilling Trazodone 200mg  qhs f/u in 4 mos.   Orders: -     traZODone HCl; TAKE 1 TO 2 TABLETS(100 TO 200 MG) BY MOUTH AT BEDTIME  Dispense: 60 tablet; Refill: 3  Seizures (HCC) Assessment  & Plan: chronic, stable followed by Neurology Keppra and Zonegran no recent seizures will continue to monitor   COPD with chronic bronchitis Assessment & Plan: chronic, stable Continue Symbicort bid As needed albuterol Not currently in exacerbation Continue to monitor  F/U 6 mos  Orders: -     Symbicort; Inhale 2 puffs into the lungs 2 (two) times daily.  History of stroke Assessment & Plan: chronic, stable Continue aspirin and statin therapy  pt not followed by Cardiology will notify office with Crestor needs refill last lipids 03/2023 f/u 4mos fasting labs   Abdominal aortic atherosclerosis (HCC) with 50% occlusion distal aorta Assessment & Plan: chronic, stable Continue aspirin and statin therapy  pt not followed by Cardiology will notify office with Crestor needs refill last lipids 03/2023 f/u 4mos fasting labs    Past Medical History:  Diagnosis Date   Abdominal pain 08/30/2019   Alcohol withdrawal (HCC) 07/29/2013   Allergy    Anxiety    Arthritis    Benzodiazepine abuse (HCC) 08/30/2019   Bipolar disorder (HCC)    CAD (coronary artery disease)    Carotid arterial disease (HCC)    Chronic insomnia 06/06/2015   Chronic low back pain 12/28/2013   Chronic migraine without aura, with intractable migraine, so stated, with status migrainosus 08/25/2019   Claustrophobia    Cocaine abuse (HCC) 05/22/2015   COPD (chronic obstructive pulmonary disease) (HCC)    Delirium tremens (HCC) 07/29/2013   Epilepsy (HCC)    last seizure week of 06-25-2020- sev. grand mal seizures per pt  Exanthem due to herpes zoster 04/01/2021   Facial droop 09/07/2022   Hemiplegic migraine with status migrainosus 06/06/2015   Hypertension 12/04/2013   Migraine    Multiple rib fractures 09/08/2022   Opioid use disorder 08/30/2019   PAD (peripheral artery disease) (HCC)    PONV (postoperative nausea and vomiting)    Prolonged Q-T interval on ECG    Rib fractures 09/07/2022    Stroke [facility])    patient denies- states had a hemiplegic migraine in ~2011   Substance abuse (HCC)    Syncope 09/07/2022    Past Surgical History:  Procedure Laterality Date   ANKLE SURGERY     in high school   APPENDECTOMY     COLONOSCOPY     KNEE ARTHROSCOPY Right    NOSE SURGERY     REVERSE SHOULDER ARTHROPLASTY Right 03/27/2022   Procedure: REVERSE SHOULDER ARTHROPLASTY;  Surgeon: Bjorn Pippin, MD;  Location: [facility];  Service: Orthopedics;  Laterality: Right;   SHOULDER ARTHROSCOPY WITH DISTAL CLAVICLE RESECTION Right 12/04/2021   Procedure: SHOULDER ARTHROSCOPY WITH DISTAL CLAVICLE EXCISION;  Surgeon: Bjorn Pippin, MD;  Location: [facility];  Service: Orthopedics;  Laterality: Right;   SHOULDER ARTHROSCOPY WITH SUBACROMIAL DECOMPRESSION, ROTATOR CUFF REPAIR AND BICEP TENDON REPAIR Right 12/04/2021   Procedure: SHOULDER ARTHROSCOPY WITH SUBACROMIAL DECOMPRESSION, ROTATOR CUFF REPAIR AND BICEP TENDON REPAIR;  Surgeon: Bjorn Pippin, MD;  Location: [facility];  Service: Orthopedics;  Laterality: Right;    Outpatient Medications Prior to Visit  Medication Sig Dispense Refill   acetaminophen (TYLENOL) 500 MG tablet Take 1,000 mg by mouth 2 (two) times daily as needed (pain).     albuterol (VENTOLIN HFA) 108 (90 Base) MCG/ACT inhaler INHALE 2 PUFFS BY MOUTH EVERY 4 HOURS AS NEEDED FOR SHORTNESS OF BREATH OR WHEEZING 8.5 g 5   aspirin EC 81 MG tablet Take 1 tablet (81 mg total) by mouth daily. Swallow whole. 90 tablet 3   levETIRAcetam (KEPPRA) 1000 MG tablet Take 1 tablet (1,000 mg total) by mouth 2 (two) times daily. (Patient taking differently: Take 1,000 mg by mouth 2 (two) times daily. Taking 500mg  QAM, 1000mg  QHS) 180 tablet 3   Probiotic Product (PROBIOTIC BLEND PO) Take by mouth.     rosuvastatin (CRESTOR) 10 MG tablet TAKE 1 TABLET(10 MG) BY MOUTH DAILY 90 tablet 2   zonisamide (ZONEGRAN) 100 MG capsule TAKE 2 CAPSULES(200 MG) BY  MOUTH DAILY 60 capsule 6   midodrine (PROAMATINE) 2.5 MG tablet Take 1 tablet (2.5 mg total) by mouth 2 (two) times daily with a meal. 180 tablet 1   ondansetron (ZOFRAN-ODT) 4 MG disintegrating tablet Take 1 tablet (4 mg total) by mouth every 8 (eight) hours as needed for nausea or vomiting. 20 tablet 6   SYMBICORT 160-4.5 MCG/ACT inhaler INHALE 2 PUFFS INTO THE LUNGS TWICE DAILY 10.2 g 5   traZODone (DESYREL) 100 MG tablet TAKE 1 TO 2 TABLETS(100 TO 200 MG) BY MOUTH AT BEDTIME 60 tablet 2   thiamine (VITAMIN B1) 100 MG tablet Take 50 mg by mouth daily. (Patient not taking: Reported on 10/20/2023)     No facility-administered medications prior to visit.    Allergies  Allergen Reactions   Sertraline Other (See Comments)    Erectile dysfunction   Codeine Nausea And Vomiting   Depakote [Divalproex Sodium] Other (See Comments)    Made patient shake   Migranal [Dihydroergotamine] Nausea And Vomiting   Topamax [Topiramate] Other (See Comments)  Made patient angry      Objective:   Physical Exam Vitals and nursing note reviewed.  Constitutional:      General: Pt is not in acute distress.    Appearance: Normal appearance.  HENT:     Head: Normocephalic.  Pulmonary:     Effort: No respiratory distress.  Musculoskeletal:     Cervical back: Normal range of motion.  Skin:    General: Skin is dry.     Coloration: Skin is not pale.  Neurological:     Mental Status: Pt is alert and oriented to person, place, and time.  Psychiatric:        Mood and Affect: Mood normal.   Ht 6' (1.829 m)   BMI 21.56 kg/m   Wt Readings from Last 3 Encounters:  09/30/23 159 lb (72.1 kg)  05/27/23 159 lb (72.1 kg)  01/29/23 159 lb (72.1 kg)      I discussed the assessment and treatment plan with the patient. The patient was provided an opportunity to ask questions and all were answered. The patient agreed with the plan and demonstrated an understanding of the instructions.   The patient was  advised to call back or seek an in-person evaluation if the symptoms worsen or if the condition fails to improve as anticipated.  Dulce Sellar, NP [facility] [phone] (phone) [phone] (fax)  [facility]

## 2023-10-20 NOTE — Assessment & Plan Note (Signed)
chronic, stable Continue Symbicort bid As needed albuterol Not currently in exacerbation Continue to monitor  F/U 6 mos

## 2023-10-20 NOTE — Assessment & Plan Note (Signed)
Chronic, stable pt reports having problem since childhood tried many different medicines, all have interaction with his Keppra, except Trazodone he can take without side effects refilling Trazodone 200mg  qhs f/u in 4 mos.

## 2023-10-20 NOTE — Assessment & Plan Note (Signed)
chronic, stable followed by Neurology Keppra and Zonegran no recent seizures will continue to monitor

## 2023-11-10 DIAGNOSIS — Z7982 Long term (current) use of aspirin: Secondary | ICD-10-CM | POA: Diagnosis not present

## 2023-11-10 DIAGNOSIS — I639 Cerebral infarction, unspecified: Secondary | ICD-10-CM | POA: Diagnosis not present

## 2023-11-10 DIAGNOSIS — F17210 Nicotine dependence, cigarettes, uncomplicated: Secondary | ICD-10-CM | POA: Diagnosis not present

## 2023-11-10 DIAGNOSIS — I700 Atherosclerosis of aorta: Secondary | ICD-10-CM | POA: Diagnosis not present

## 2023-11-10 DIAGNOSIS — R569 Unspecified convulsions: Secondary | ICD-10-CM | POA: Diagnosis not present

## 2023-11-10 DIAGNOSIS — Z136 Encounter for screening for cardiovascular disorders: Secondary | ICD-10-CM | POA: Diagnosis not present

## 2023-11-10 DIAGNOSIS — I2510 Atherosclerotic heart disease of native coronary artery without angina pectoris: Secondary | ICD-10-CM | POA: Diagnosis not present

## 2023-11-10 DIAGNOSIS — I959 Hypotension, unspecified: Secondary | ICD-10-CM | POA: Diagnosis not present

## 2023-11-10 DIAGNOSIS — F1010 Alcohol abuse, uncomplicated: Secondary | ICD-10-CM | POA: Diagnosis not present

## 2023-11-10 DIAGNOSIS — F1290 Cannabis use, unspecified, uncomplicated: Secondary | ICD-10-CM | POA: Diagnosis not present

## 2023-11-10 DIAGNOSIS — G43409 Hemiplegic migraine, not intractable, without status migrainosus: Secondary | ICD-10-CM | POA: Diagnosis not present

## 2023-11-10 DIAGNOSIS — I10 Essential (primary) hypertension: Secondary | ICD-10-CM | POA: Diagnosis not present

## 2023-11-10 DIAGNOSIS — R457 State of emotional shock and stress, unspecified: Secondary | ICD-10-CM | POA: Diagnosis not present

## 2023-11-10 DIAGNOSIS — R519 Headache, unspecified: Secondary | ICD-10-CM | POA: Diagnosis not present

## 2023-11-10 DIAGNOSIS — G43709 Chronic migraine without aura, not intractable, without status migrainosus: Secondary | ICD-10-CM | POA: Diagnosis not present

## 2023-11-10 DIAGNOSIS — G4489 Other headache syndrome: Secondary | ICD-10-CM | POA: Diagnosis not present

## 2023-11-10 DIAGNOSIS — G40909 Epilepsy, unspecified, not intractable, without status epilepticus: Secondary | ICD-10-CM | POA: Diagnosis not present

## 2023-11-10 DIAGNOSIS — Z96611 Presence of right artificial shoulder joint: Secondary | ICD-10-CM | POA: Diagnosis not present

## 2023-11-10 DIAGNOSIS — M25511 Pain in right shoulder: Secondary | ICD-10-CM | POA: Diagnosis not present

## 2023-11-10 DIAGNOSIS — G43909 Migraine, unspecified, not intractable, without status migrainosus: Secondary | ICD-10-CM | POA: Diagnosis not present

## 2023-11-10 DIAGNOSIS — Z79899 Other long term (current) drug therapy: Secondary | ICD-10-CM | POA: Diagnosis not present

## 2023-11-10 DIAGNOSIS — I672 Cerebral atherosclerosis: Secondary | ICD-10-CM | POA: Diagnosis not present

## 2023-11-10 DIAGNOSIS — M19011 Primary osteoarthritis, right shoulder: Secondary | ICD-10-CM | POA: Diagnosis not present

## 2023-11-10 DIAGNOSIS — I6523 Occlusion and stenosis of bilateral carotid arteries: Secondary | ICD-10-CM | POA: Diagnosis not present

## 2023-11-10 DIAGNOSIS — I25118 Atherosclerotic heart disease of native coronary artery with other forms of angina pectoris: Secondary | ICD-10-CM | POA: Diagnosis not present

## 2023-11-10 DIAGNOSIS — R4701 Aphasia: Secondary | ICD-10-CM | POA: Diagnosis not present

## 2023-11-10 DIAGNOSIS — R4182 Altered mental status, unspecified: Secondary | ICD-10-CM | POA: Diagnosis not present

## 2023-11-10 DIAGNOSIS — G8929 Other chronic pain: Secondary | ICD-10-CM | POA: Diagnosis not present

## 2023-11-10 DIAGNOSIS — I440 Atrioventricular block, first degree: Secondary | ICD-10-CM | POA: Diagnosis not present

## 2023-11-13 ENCOUNTER — Telehealth: Payer: Self-pay

## 2023-11-13 NOTE — Telephone Encounter (Signed)
Copied from CRM [id number]. Topic: General - Other >> Nov 13, 2023  4:04 PM Orinda Kenner C wrote: Reason for CRM: FYI: Spencer Mendoza [id number] from [facility] states pt was just released from [facility] and he has home health order for nursing, OT and PT.  She will fax over the orders to have provider sign.    I will place on providers desk once received.

## 2023-11-20 ENCOUNTER — Ambulatory Visit (INDEPENDENT_AMBULATORY_CARE_PROVIDER_SITE_OTHER): Payer: 59 | Admitting: Family

## 2023-11-20 ENCOUNTER — Encounter: Payer: Self-pay | Admitting: Family

## 2023-11-20 VITALS — BP 121/83 | HR 61 | Temp 98.0°F | Ht 72.0 in | Wt 146.0 lb

## 2023-11-20 DIAGNOSIS — F411 Generalized anxiety disorder: Secondary | ICD-10-CM | POA: Diagnosis not present

## 2023-11-20 DIAGNOSIS — G43401 Hemiplegic migraine, not intractable, with status migrainosus: Secondary | ICD-10-CM

## 2023-11-20 NOTE — Progress Notes (Signed)
 Patient ID: Spencer Mendoza, male    DOB: [birth year], 63 y.o.   MRN: [id number]  Chief Complaint  Patient presents with   Hospitalization Follow-up    Pt here for hosp f/u - pt c/o of weakness on right side  that is constant   Anxiety    Pt c/o of anxiety    Hypertension   COPD       Discussed the use of AI scribe software for clinical note transcription with the patient, who gave verbal consent to proceed.  History of Present Illness   The patient present with his sister, and with a history of seizures and depression, presents with ongoing head pain and anxiety after discharge from a hospital stay 12/24-12/27. He was hospitalized due to a suspected migraine hemiplegic, which causes imbalance, slurred speech and pain. A new CVA was ruled out via CT scan. The patient reports that the head pain is severe and constant, sometimes worsening with movement. The patient's anxiety is very high due to having these symptoms. He was given Ativan  prior to needing an MRI d/t claustrophobia, but he still was unable to undergo the MRI. His anxiety/depression has been resistant to various medications in the past, and he is not currently seeing a therapist. He has a past history of drug abuse and overdose. The patient is currently staying in [place]  helping to care for his brother-in-law, but [facility] ordered by the hospital for ST & PT is unable to see him in [place] as the orders came from [facility] providers. He is trying to make arrangements to move back to  temporarily.     Assessment & Plan:     Migraine Hemiplegic - Recent hospitalization for severe headache and imbalance, thought to be CVA at first, but not completely r/o d/t pt inability to tolerate MRI. Pt is established with Neurology and he is on a call back list for earliest appt available, his scheduled appt is not till March. Currently on Keppra  and Zonegran  for seizures. -Continue Keppra  & Zonegran  as prescribed. -Advised patient to call neurology office  once or twice a week to check for earlier appointment availability.  Anxiety/Depression - History of depression with poor response to previous medications. Recent increase in anxiety and distress over current health status. Pt refusing any anxiety medication other than a Benzo. Believe pt could benefit from a benzodiazepine for sx, also considering difficulty w/dx of seizures and meds r/t causing interactions. But, advised pt and sister I am not comfortable prescribing a sedative d/t hx of addiction and overdose. I would consider only if a family member would be willing to take control of medication and administer to pt.  -Consider prescribing Lorazepam  for anxiety, if family member willing to sign contract and administer medication.   Home Health - Order for home health in place, but patient currently residing in [place]  and they can only accept orders from providers working in [place] . -Coordinate with patient to establish home health services once patient returns to [place] .     Subjective:    Outpatient Medications Prior to Visit  Medication Sig Dispense Refill   acetaminophen  (TYLENOL ) 500 MG tablet Take 1,000 mg by mouth 2 (two) times daily as needed (pain).     albuterol  (VENTOLIN  HFA) 108 (90 Base) MCG/ACT inhaler INHALE 2 PUFFS BY MOUTH EVERY 4 HOURS AS NEEDED FOR SHORTNESS OF BREATH OR WHEEZING 8.5 g 5   aspirin  EC 81 MG tablet Take 1 tablet (81 mg total) by mouth daily. Swallow  whole. 90 tablet 3   levETIRAcetam  (KEPPRA ) 1000 MG tablet Take 1 tablet (1,000 mg total) by mouth 2 (two) times daily. 180 tablet 3   Probiotic Product (PROBIOTIC BLEND PO) Take by mouth.     SYMBICORT  160-4.5 MCG/ACT inhaler Inhale 2 puffs into the lungs 2 (two) times daily.     traZODone  (DESYREL ) 100 MG tablet TAKE 1 TO 2 TABLETS(100 TO 200 MG) BY MOUTH AT BEDTIME 60 tablet 3   zonisamide  (ZONEGRAN ) 100 MG capsule TAKE 2 CAPSULES(200 MG) BY MOUTH DAILY 60 capsule 6   rosuvastatin  (CRESTOR ) 10 MG  tablet TAKE 1 TABLET(10 MG) BY MOUTH DAILY (Patient not taking: Reported on 11/20/2023) 90 tablet 2   No facility-administered medications prior to visit.   Past Medical History:  Diagnosis Date   Abdominal pain 08/30/2019   Alcohol  withdrawal (HCC) 07/29/2013   Allergy    Anxiety    Arthritis    Benzodiazepine abuse (HCC) 08/30/2019   Bipolar disorder (HCC)    CAD (coronary artery disease)    Carotid arterial disease (HCC)    Chronic insomnia 06/06/2015   Chronic low back pain 12/28/2013   Chronic migraine without aura, with intractable migraine, so stated, with status migrainosus 08/25/2019   Claustrophobia    Cocaine abuse (HCC) 05/22/2015   COPD (chronic obstructive pulmonary disease) (HCC)    Delirium tremens (HCC) 07/29/2013   Epilepsy (HCC)    last seizure week of 06-25-2020- sev. grand mal seizures per pt    Exanthem due to herpes zoster 04/01/2021   Facial droop 09/07/2022   Fall at home, initial encounter 09/07/2022   Hemiplegic migraine with status migrainosus 06/06/2015   Hypertension 12/04/2013   Migraine    Multiple rib fractures 09/08/2022   Opioid use disorder 08/30/2019   Osteoarthritis of left wrist 01/04/2020   PAD (peripheral artery disease) (HCC)    PONV (postoperative nausea and vomiting)    Prolonged Q-T interval on ECG    Rib fractures 09/07/2022   Sinus bradycardia 09/07/2022   Stroke [facility])    patient denies- states had a hemiplegic migraine in ~2011   Substance abuse (HCC)    Syncope 09/07/2022   Past Surgical History:  Procedure Laterality Date   ANKLE SURGERY     in high school   APPENDECTOMY     COLONOSCOPY     KNEE ARTHROSCOPY Right    NOSE SURGERY     REVERSE SHOULDER ARTHROPLASTY Right 03/27/2022   Procedure: REVERSE SHOULDER ARTHROPLASTY;  Surgeon: Cristy Bonner DASEN, MD;  Location: [facility];  Service: Orthopedics;  Laterality: Right;   SHOULDER ARTHROSCOPY WITH DISTAL CLAVICLE RESECTION Right 12/04/2021   Procedure:  SHOULDER ARTHROSCOPY WITH DISTAL CLAVICLE EXCISION;  Surgeon: Cristy Bonner DASEN, MD;  Location: [facility];  Service: Orthopedics;  Laterality: Right;   SHOULDER ARTHROSCOPY WITH SUBACROMIAL DECOMPRESSION, ROTATOR CUFF REPAIR AND BICEP TENDON REPAIR Right 12/04/2021   Procedure: SHOULDER ARTHROSCOPY WITH SUBACROMIAL DECOMPRESSION, ROTATOR CUFF REPAIR AND BICEP TENDON REPAIR;  Surgeon: Cristy Bonner DASEN, MD;  Location: [facility];  Service: Orthopedics;  Laterality: Right;   Allergies  Allergen Reactions   Sertraline  Other (See Comments)    Erectile dysfunction   Codeine Nausea And Vomiting   Depakote  [Divalproex  Sodium] Other (See Comments)    Made patient shake   Migranal  [Dihydroergotamine ] Nausea And Vomiting   Topamax  [Topiramate ] Other (See Comments)    Made patient angry      Objective:    Physical Exam Vitals  and nursing note reviewed.  Constitutional:      General: He is not in acute distress.    Appearance: Normal appearance.  HENT:     Head: Normocephalic.  Cardiovascular:     Rate and Rhythm: Normal rate and regular rhythm.  Pulmonary:     Effort: Pulmonary effort is normal.     Breath sounds: Normal breath sounds.  Musculoskeletal:        General: Normal range of motion.     Cervical back: Normal range of motion.  Skin:    General: Skin is warm and dry.  Neurological:     Mental Status: He is alert and oriented to person, place, and time.  Psychiatric:        Mood and Affect: Mood normal.   BP 121/83   Pulse 61   Temp 98 F (36.7 C)   Ht 6' (1.829 m)   Wt 146 lb (66.2 kg)   SpO2 99%   BMI 19.80 kg/m  Wt Readings from Last 3 Encounters:  11/20/23 146 lb (66.2 kg)  09/30/23 159 lb (72.1 kg)  05/27/23 159 lb (72.1 kg)      Lucius Krabbe, NP

## 2023-12-01 DIAGNOSIS — F411 Generalized anxiety disorder: Secondary | ICD-10-CM | POA: Insufficient documentation

## 2023-12-01 NOTE — Assessment & Plan Note (Signed)
 History of depression with poor response to previous medications. Recent increase in anxiety and distress over current health status. Pt refusing any anxiety medication other than a Benzo. Believe pt could benefit from a benzodiazepine for sx, also considering difficulty w/dx of seizures and meds r/t causing interactions. But, advised pt and sister I am not comfortable prescribing a sedative d/t hx of addiction and overdose. I would consider only if a family member would be willing to take control of medication and administer to pt.  -Consider prescribing Lorazepam  for anxiety, if family member willing to sign contract and administer medication.

## 2023-12-01 NOTE — Assessment & Plan Note (Signed)
 Recent hospitalization for severe headache and imbalance, thought to be CVA at first, but not completely r/o d/t pt inability to tolerate MRI. Pt is established with Neurology and he is on a call back list for earliest appt available, his scheduled appt is not till March. Currently on Keppra  and Zonegran  for seizures. -Continue Keppra  & Zonegran  as prescribed. -Advised patient to call neurology office once or twice a week to check for earlier appointment availability.

## 2023-12-28 ENCOUNTER — Telehealth: Payer: Self-pay | Admitting: Cardiology

## 2023-12-28 NOTE — Telephone Encounter (Signed)
 STAT call transferred to triage.  Patient states he had a stroke on 11/10/23, and since this time has continued to have intermittent chest pain with SOB, ongoing right-sided weakness. He also reports feeling dizzy.  Advised patient to go to ED for evaluation, patient states he wants to be seen by one of our providers and does not want to go to ED again. Offered appt with DOD at [facility] office tomorrow 2/11 at 3:00pm. Patient states he will need to call his sister to see if she can take him to appt, he states he will call back if he is unable to make it.  Encouraged patient to go to ED, patient again declined. Appt scheduled with DOD Dr. Katheryne Pane at [facility] office for evaluation tomorrow 12/29/23 at 3:00pm.

## 2023-12-28 NOTE — Telephone Encounter (Signed)
 Pt c/o of Chest Pain: STAT if CP now or developed within 24 hours  1. Are you having CP right now? Yes   2. Are you experiencing any other symptoms (ex. SOB, nausea, vomiting, sweating)? SOB, Right arm numbness and lightheaded.   3. How long have you been experiencing CP? Since 12/24   4. Is your CP continuous or coming and going? Continuous   5. Have you taken Nitroglycerin ? No  ?

## 2023-12-29 ENCOUNTER — Ambulatory Visit: Payer: 59 | Admitting: Cardiovascular Disease

## 2023-12-29 NOTE — Telephone Encounter (Signed)
Patient called unable to make appt today due to weather. States his sister is unable to drive in this weather. Rescheduled to tomorrow, but believes this time frame will not work either. States a Thursday 02/13 appt would work better for his sister to bring him. Requesting call back to see about moving appt to this time frame.  Please advise.

## 2023-12-29 NOTE — Progress Notes (Deleted)
 Cardiology Clinic Note   Patient Name: Spencer Mendoza Date of Encounter: 12/29/2023  Primary Care Provider:  Dulce Sellar, NP Primary Cardiologist:  Donato Schultz, MD  Patient Profile    Spencer Mendoza 63 year old male presents to the clinic today for evaluation of his intermittent chest pain, shortness of breath, and ongoing right sided weakness.  He had CVA 11/10/2023.  Past Medical History    Past Medical History:  Diagnosis Date   Abdominal pain 08/30/2019   Alcohol withdrawal (HCC) 07/29/2013   Allergy    Anxiety    Arthritis    Benzodiazepine abuse (HCC) 08/30/2019   Bipolar disorder (HCC)    CAD (coronary artery disease)    Carotid arterial disease (HCC)    Chronic insomnia 06/06/2015   Chronic low back pain 12/28/2013   Chronic migraine without aura, with intractable migraine, so stated, with status migrainosus 08/25/2019   Claustrophobia    Cocaine abuse (HCC) 05/22/2015   COPD (chronic obstructive pulmonary disease) (HCC)    Delirium tremens (HCC) 07/29/2013   Epilepsy (HCC)    last seizure week of 06-25-2020- sev. grand mal seizures per pt    Exanthem due to herpes zoster 04/01/2021   Facial droop 09/07/2022   Fall at home, initial encounter 09/07/2022   Hemiplegic migraine with status migrainosus 06/06/2015   Hypertension 12/04/2013   Migraine    Multiple rib fractures 09/08/2022   Opioid use disorder 08/30/2019   Osteoarthritis of left wrist 01/04/2020   PAD (peripheral artery disease) (HCC)    PONV (postoperative nausea and vomiting)    Prolonged Q-T interval on ECG    Rib fractures 09/07/2022   Sinus bradycardia 09/07/2022   Stroke [facility])    patient denies- states had a hemiplegic migraine in ~2011   Substance abuse (HCC)    Syncope 09/07/2022   Past Surgical History:  Procedure Laterality Date   ANKLE SURGERY     in high school   APPENDECTOMY     COLONOSCOPY     KNEE ARTHROSCOPY Right    NOSE SURGERY     REVERSE SHOULDER  ARTHROPLASTY Right 03/27/2022   Procedure: REVERSE SHOULDER ARTHROPLASTY;  Surgeon: Bjorn Pippin, MD;  Location: [facility];  Service: Orthopedics;  Laterality: Right;   SHOULDER ARTHROSCOPY WITH DISTAL CLAVICLE RESECTION Right 12/04/2021   Procedure: SHOULDER ARTHROSCOPY WITH DISTAL CLAVICLE EXCISION;  Surgeon: Bjorn Pippin, MD;  Location: [facility];  Service: Orthopedics;  Laterality: Right;   SHOULDER ARTHROSCOPY WITH SUBACROMIAL DECOMPRESSION, ROTATOR CUFF REPAIR AND BICEP TENDON REPAIR Right 12/04/2021   Procedure: SHOULDER ARTHROSCOPY WITH SUBACROMIAL DECOMPRESSION, ROTATOR CUFF REPAIR AND BICEP TENDON REPAIR;  Surgeon: Bjorn Pippin, MD;  Location: [facility];  Service: Orthopedics;  Laterality: Right;    Allergies  Allergies  Allergen Reactions   Sertraline Other (See Comments)    Erectile dysfunction   Codeine Nausea And Vomiting   Depakote [Divalproex Sodium] Other (See Comments)    Made patient shake   Migranal [Dihydroergotamine] Nausea And Vomiting   Topamax [Topiramate] Other (See Comments)    Made patient angry    History of Present Illness    Spencer Mendoza has a PMH of nonobstructive CAD (coronary CT 5/21), vasovagal syncope, PVD (stenosis of IMA, bilateral iliac arteries and less than 50% distal aorta seen by [facility] 2021), carotid artery disease (1-49% BICA by duplex 10/23, sinus bradycardia, PSVT, PAT, junctional tachycardia by monitor 2021, alcohol abuse, prior cocaine abuse, epilepsy, migraines,  COPD, emphysema/possible pulmonary fibrosis by prior CT.  He was seen and evaluated by Dr. Anne Fu in 2021 for unusual episodes of recurrent LOC.  Echocardiogram showed an EF of 50-55%, no regional wall motion abnormalities, mild MR.  Coronary CT showed coronary calcium score of 330 with moderate plaque in his LAD and mild and RCA, FFR negative, cardiac event monitor showed normal sinus rhythm, rare junctional tachycardia, occasional  SVT/PAT and no pauses or atrial fibrillation.  His syncope was felt to be related to vasovagal/noncardiac in etiology.  He was admitted to the hospital 10/23 with multiple recurrent episodes of LOC, 4 out of 5 nights prior to admission.  He reported 10 episodes in the last 6 months.  They were noted primarily with getting up to go to the bathroom at night.  His sister reported that he was unconscious for 30-40 minutes.  He urinated on himself during the event.  Also during the episode before he soiled himself with BM.  He denied any prodrome of symptoms except for occasional episodes feeling like his heart was not beating quite right and slower.  He had lost around 40 pounds unintentionally in the prior year.  He reported smoking 2 to 3 cigarettes/day and drinking 1 beer every other day.  He denied illicit drug use aside from marijuana.  His chest x-ray showed patchy opacity.  CT chest showed rib fracture, aorta and coronary atherosclerosis, emphysematous changes and no PE.  Head CT showed no acute changes.  Orthostatic vitals were negative he was advised to not drive for 6 months.  He was admitted 09/21/2022 with delirium due to EtOH intoxication and left AMA.  He was seen in follow-up by Lucile Crater, PA-C on 10/15/2022.  During that time he presented with another person named Diane who he had known for quite some time.  He was not taking midodrine due to not having refills.  He reports ported that he previously stopped ASA and rosuvastatin prior to a shoulder surgery because he was instructed to do so.  He denied recurrent LOC changes however he still periodically noted dizziness upon standing.  He did not have resting dizziness.  His main complaint was body pain which was chronic for him.  He reported a component of chest discomfort which was exacerbated with laying in certain positions.  His discomfort felt better with exertion.  He did not have angina.  He was seen in follow-up by his PCP on 11/20/2023.  He  reported severe headaches and imbalance.  These were first felt to be related to CVA but MRI was ordered to completely rule out.  He was unable to tolerate MRI due to claustrophobia.  He was placed on Keppra and Zonegran.  He was advised to follow-up with neurology.  Patient's appointment was not till March.  He was encouraged to contact the office weekly to set up sooner appointment.  He contacted the nurse triage line on 12/29/2023.  He reported that he had CVA on 11/10/2023.  Since that time he noted intermittent chest discomfort with shortness of breath, ongoing right sided weakness, and reported feeling dizzy.  He presents to the clinic today for evaluation and states***.  *** denies chest pain, shortness of breath, lower extremity edema, fatigue, palpitations, melena, hematuria, hemoptysis, diaphoresis, weakness, presyncope, syncope, orthopnea, and PND.  Chest discomfort-symptoms present at rest.  Denies exertional chest discomfort.  Previous coronary CT showed moderate plaque in LAD and mild plaque in his RCA, FFR negative.  Chest discomfort does not appear  to be related to cardiac issues. No plans for ischemic evaluation at this time  Shortness of breath-noted with episodes of chest discomfort.  Denies palpitations.  Previous cardiac event monitor showed normal sinus rhythm, rare junctional tachycardia, and occasional SVT and PAT with no pauses or atrial fibrillation.  Patient sedentary since CVA.  Suspect shortness of breath is multifactorial related to episodes of palpitations versus deconditioning related to inactivity. Order echocardiogram Order 7-day cardiac event monitor  Essential hypertension-BP today***. Maintain blood pressure log Low-sodium diet  Hyperlipidemia-LDL***. High-fiber diet Continue rosuvastatin  Dizziness- improving since 11/10/2023 hospital admission.  Blood pressure today***.  Do not suspect cardiac involvement. Continue Keppra and Zonegran Maintain p.o.  hydration Change positions slowly  CVA, hemiplegic migraine-felt to be more likely due to hemiplegic migraine.  CVA did not present on head CT.  Notes headaches about 2-3 times per week.  Speech today***. Following with neurology  Disposition: Follow-up with Dr. Anne Fu or APP after diagnostic testing  Home Medications    Prior to Admission medications   Medication Sig Start Date End Date Taking? Authorizing Provider  acetaminophen (TYLENOL) 500 MG tablet Take 1,000 mg by mouth 2 (two) times daily as needed (pain).    [provider]  albuterol (VENTOLIN HFA) 108 (90 Base) MCG/ACT inhaler INHALE 2 PUFFS BY MOUTH EVERY 4 HOURS AS NEEDED FOR SHORTNESS OF BREATH OR WHEEZING 06/03/23   Dulce Sellar, NP  aspirin EC 81 MG tablet Take 1 tablet (81 mg total) by mouth daily. Swallow whole. 10/15/22   Dunn, Tacey Ruiz, PA-C  levETIRAcetam (KEPPRA) 1000 MG tablet Take 1 tablet (1,000 mg total) by mouth 2 (two) times daily. 01/29/23 01/24/24  Windell Norfolk, MD  Probiotic Product (PROBIOTIC BLEND PO) Take by mouth.    [provider]  rosuvastatin (CRESTOR) 10 MG tablet TAKE 1 TABLET(10 MG) BY MOUTH DAILY 03/05/23   Dunn, Tacey Ruiz, PA-C  SYMBICORT 160-4.5 MCG/ACT inhaler Inhale 2 puffs into the lungs 2 (two) times daily. 10/20/23   Dulce Sellar, NP  traZODone (DESYREL) 100 MG tablet TAKE 1 TO 2 TABLETS(100 TO 200 MG) BY MOUTH AT BEDTIME 10/20/23   Dulce Sellar, NP  zonisamide (ZONEGRAN) 100 MG capsule TAKE 2 CAPSULES(200 MG) BY MOUTH DAILY 09/07/23   Windell Norfolk, MD    Family History    Family History  Problem Relation Age of Onset   Heart disease Mother    Stroke Mother    Epilepsy Mother    Heart failure Mother    Cancer Father    Heart disease Father    Hypertension Father    Melanoma Father    Migraines Father    Colon cancer Sister 20   Colon cancer Cousin    Cancer Cousin    Colon polyps Neg Hx    Esophageal cancer Neg Hx    Rectal cancer Neg Hx     Stomach cancer Neg Hx    He indicated that his mother is deceased. He indicated that his father is deceased. He indicated that all of his three sisters are alive. He indicated that his brother is alive. He indicated that the status of his paternal uncle is unknown and reported the following: brain tumor. He indicated that only one of his two cousins is alive. He indicated that the status of his neg hx is unknown.  Social History    Social History   Socioeconomic History   Marital status: Legally Separated    Spouse name: Not on file  Number of children: 0   Years of education: GED   Highest education level: Not on file  Occupational History   Occupation: disabled  Tobacco Use   Smoking status: Every Day    Current packs/day: 0.25    Average packs/day: 0.3 packs/day for 15.0 years (3.8 ttl pk-yrs)    Types: Cigarettes   Smokeless tobacco: Never   Tobacco comments:    States he quit 02-25-22  Vaping Use   Vaping status: Never Used  Substance and Sexual Activity   Alcohol use: Yes    Alcohol/week: 6.0 standard drinks of alcohol    Types: 6 Cans of beer per week    Comment: one a night   Drug use: Yes    Types: Marijuana    Comment: daily   Sexual activity: Yes    Birth control/protection: Condom  Other Topics Concern   Not on file  Social History Narrative   Pt lives with sister.   Patient drinks 2 cups of caffeine daily.   Patient is right handed.   Social Drivers of Corporate investment banker Strain: Low Risk  (11/12/2023)   Received from [facility]   Overall Financial Resource Strain (CARDIA)    Difficulty of Paying Living Expenses: Not hard at all  Food Insecurity: No Food Insecurity (11/12/2023)   [facility]   Hunger Vital Sign    Worried About Running Out of Food in the Last Year: Never true    Ran Out of Food in the Last Year: Never true  Transportation Needs: No Transportation Needs (11/12/2023)   Received from [facility] - Transportation    Lack of Transportation (Medical): No    Lack of Transportation (Non-Medical): No  Recent Concern: Transportation Needs - Unmet Transportation Needs (09/30/2023)   PRAPARE - Transportation    Lack of Transportation (Medical): Yes    Lack of Transportation (Non-Medical): Yes  Physical Activity: Sufficiently Active (09/30/2023)   Exercise Vital Sign    Days of Exercise per Week: 5 days    Minutes of Exercise per Session: 30 min  Stress: No Stress Concern Present (09/30/2023)   [employer] of Occupational Health - Occupational Stress Questionnaire    Feeling of Stress : Not at all  Social Connections: Moderately Isolated (09/30/2023)   Social Connection and Isolation Panel [NHANES]    Frequency of Communication with Friends and Family: More than three times a week    Frequency of Social Gatherings with Friends and Family: More than three times a week    Attends Religious Services: 1 to 4 times per year    Active Member of [employer] or Organizations: No    Attends Banker Meetings: Never    Marital Status: Separated  Intimate Partner Violence: Not At Risk (11/12/2023)   Received from [facility]   Humiliation, Afraid, Rape, and Kick questionnaire    Fear of Current or Ex-Partner: No    Emotionally Abused: No    Physically Abused: No    Sexually Abused: No     Review of Systems    General:  No chills, fever, night sweats or weight changes.  Cardiovascular:  No chest pain, dyspnea on exertion, edema, orthopnea, palpitations, paroxysmal nocturnal dyspnea. Dermatological: No rash, lesions/masses Respiratory: No cough, dyspnea Urologic: No hematuria, dysuria Abdominal:   No nausea, vomiting, diarrhea, bright red blood per rectum, melena, or hematemesis Neurologic:  No visual changes, wkns, changes in mental status. All  other systems reviewed and are otherwise negative except as noted above.  Physical Exam    VS:  There were no vitals  taken for this visit. , BMI There is no height or weight on file to calculate BMI. GEN: Well nourished, well developed, in no acute distress. HEENT: normal. Neck: Supple, no JVD, carotid bruits, or masses. Cardiac: RRR, no murmurs, rubs, or gallops. No clubbing, cyanosis, edema.  Radials/DP/PT 2+ and equal bilaterally.  Respiratory:  Respirations regular and unlabored, clear to auscultation bilaterally. GI: Soft, nontender, nondistended, BS + x 4. MS: no deformity or atrophy. Skin: warm and dry, no rash. Neuro:  Strength and sensation are intact. Psych: Normal affect.  Accessory Clinical Findings    Recent Labs: No results found for requested labs within last 365 days.   Recent Lipid Panel    Component Value Date/Time   CHOL 143 09/09/2022 0422   CHOL 163 07/13/2020 0921   TRIG 85 09/09/2022 0422   HDL 63 09/09/2022 0422   HDL 101 07/13/2020 0921   CHOLHDL 2.3 09/09/2022 0422   VLDL 17 09/09/2022 0422   LDLCALC 63 09/09/2022 0422   LDLCALC 50 07/13/2020 0921    No BP recorded.  {Refresh Note OR Click here to enter BP  :1}***    ECG personally reviewed by me today- ***     Echocardiogram 09/08/2022 IMPRESSIONS     1. Left ventricular ejection fraction, by estimation, is 60 to 65%. The  left ventricle has normal function. The left ventricle has no regional  wall motion abnormalities. There is mild left ventricular hypertrophy.  Left ventricular diastolic parameters  were normal.   2. Right ventricular systolic function is normal. The right ventricular  size is normal. There is normal pulmonary artery systolic pressure.   3. The mitral valve is normal in structure. No evidence of mitral valve  regurgitation. No evidence of mitral stenosis.   4. The aortic valve has an indeterminant number of cusps. Aortic valve  regurgitation is not visualized. No aortic stenosis is present.   5. The inferior vena cava is normal in size with greater than 50%  respiratory variability,  suggesting right atrial pressure of 3 mmHg.   FINDINGS   Left Ventricle: Left ventricular ejection fraction, by estimation, is 60  to 65%. The left ventricle has normal function. The left ventricle has no  regional wall motion abnormalities. The left ventricular internal cavity  size was normal in size. There is   mild left ventricular hypertrophy. Left ventricular diastolic parameters  were normal.   Right Ventricle: The right ventricular size is normal. Right vetricular  wall thickness was not well visualized. Right ventricular systolic  function is normal. There is normal pulmonary artery systolic pressure.  The tricuspid regurgitant velocity is 2.14  m/s, and with an assumed right atrial pressure of 3 mmHg, the estimated  right ventricular systolic pressure is 21.3 mmHg.   Left Atrium: Left atrial size was normal in size.   Right Atrium: Right atrial size was normal in size.   Pericardium: There is no evidence of pericardial effusion.   Mitral Valve: The mitral valve is normal in structure. No evidence of  mitral valve regurgitation. No evidence of mitral valve stenosis. MV peak  gradient, 2.5 mmHg. The mean mitral valve gradient is 1.0 mmHg.   Tricuspid Valve: The tricuspid valve is normal in structure. Tricuspid  valve regurgitation is not demonstrated. No evidence of tricuspid  stenosis.   Aortic Valve: The aortic valve has  an indeterminant number of cusps.  Aortic valve regurgitation is not visualized. No aortic stenosis is  present. Aortic valve mean gradient measures 2.0 mmHg. Aortic valve peak  gradient measures 6.0 mmHg. Aortic valve  area, by VTI measures 2.87 cm.   Pulmonic Valve: The pulmonic valve was not well visualized. Pulmonic valve  regurgitation is not visualized. No evidence of pulmonic stenosis.   Aorta: The aortic root is normal in size and structure.   Venous: The inferior vena cava is normal in size with greater than 50%  respiratory variability,  suggesting right atrial pressure of 3 mmHg.   IAS/Shunts: No atrial level shunt detected by color flow Doppler.       Assessment & Plan   1.  ***   Thomasene Ripple. Kendyll Huettner NP-C     12/29/2023, 10:37 AM [facility] [address] Office [phone] Fax [phone]    I spent***minutes examining this patient, reviewing medications, and using patient centered shared decision making involving their cardiac care.   I spent  20 minutes reviewing past medical history,  medications, and prior cardiac tests.

## 2023-12-30 ENCOUNTER — Ambulatory Visit: Payer: 59 | Admitting: General Practice

## 2024-01-03 NOTE — Progress Notes (Deleted)
 Cardiology Office Note:    Date:  01/03/2024   ID:  ZIGMUND LINSE, DOB 1960/11/21, MRN [id number]  PCP:  Dulce Sellar, NP   [facility] Providers Cardiologist:  Donato Schultz, MD { Click to update primary MD,subspecialty MD or APP then REFRESH:1}    Referring MD: Dulce Sellar, NP   No chief complaint on file. ***  History of Present Illness:    Spencer Mendoza is a 63 y.o. male with a hx of nonobstructive CAD (coronary CT 5/21), vasovagal syncope, PVD (stenosis of IMA, bilateral iliac arteries and less than 50% distal aorta seen by [facility] 2021), carotid artery disease (1-49% BICA by duplex 10/23), sinus bradycardia, PSVT, PAT, junctional tachycardia by monitor 2021, alcohol abuse, prior cocaine abuse, epilepsy, migraines, COPD, emphysema/possible pulmonary fibrosis by prior CT.  He was seen and evaluated by Dr. Anne Fu in 2021 for unusual episodes of recurrent LOC.  Echocardiogram showed an EF of 50-55%, no regional wall motion abnormalities, mild MR.  Coronary CT showed coronary calcium score of 330 with moderate plaque in his LAD and mild and RCA, FFR negative, cardiac event monitor showed normal sinus rhythm, rare junctional tachycardia, occasional SVT/PAT and no pauses or atrial fibrillation.  His syncope was felt to be related to vasovagal/noncardiac in etiology.  He was admitted to the hospital 10/23 with multiple recurrent episodes of LOC, 4 out of 5 nights prior to admission.  He reported 10 episodes in the last 6 months.  They were noted primarily with getting up to go to the bathroom at night.  His sister reported that he was unconscious for 30-40 minutes with urinary incontinence. He has also had stool incontinence with prior syncopal episodes.  He denied any prodrome of symptoms except for occasional episodes feeling like his heart was not beating quite right and slower.  He had lost around 40 pounds unintentionally in the prior year.  He reported smoking 2 to  3 cigarettes/day and drinking 1 beer every other day.  He denied illicit drug use aside from marijuana.  His chest x-ray showed patchy opacity.  CT chest showed rib fracture, aorta and coronary atherosclerosis, emphysematous changes and no PE.  Head CT showed no acute changes.  Orthostatic vitals were negative he was advised to not drive for 6 months.  He was admitted 09/21/2022 with delirium due to EtOH intoxication and left AMA.  He was seen in follow-up by Ronie Spies, PA-C on 10/15/2022.  During that time he presented with another person named Diane who he had known for quite some time.  He was not taking midodrine due to not having refills.  He reports that he previously stopped ASA and rosuvastatin prior to a shoulder surgery because he was instructed to do so.  He denied recurrent LOC changes however he still periodically noted dizziness upon standing.  He did not have resting dizziness.  His main complaint was body pain which was chronic for him.  He reported a component of chest discomfort which was exacerbated with laying in certain positions.  His discomfort felt better with exertion.  He did not have angina.  He was seen in follow-up by his PCP on 11/20/2023.  He reported severe headaches and imbalance.  These were first felt to be related to CVA during a hospitalization 11/10/23. He presented 11/10/23 wit symptoms concerning for CVA, but symptoms started 2 weeks prior. MRI was ordered but he was unable to tolerate MRI due to claustrophobia.  He was placed on Keppra and  Zonegran.  He was advised to follow-up with neurology, has not yet seen. Per notes, differential was CVA vs hemiplegic migraine; felt less likely CVA as CT head was negative.   He contacted the nurse triage line on 12/29/2023.  He reported that he had CVA on 11/10/2023.  Since that time he noted intermittent chest discomfort with shortness of breath, ongoing right sided weakness, and reported feeling dizzy.    He presents to the  clinic today for evaluation and states***.  *** denies chest pain, shortness of breath, lower extremity edema, fatigue, palpitations, melena, hematuria, hemoptysis, diaphoresis, weakness, presyncope, syncope, orthopnea, and PND.  Chest discomfort-symptoms present at rest.  Denies exertional chest discomfort.  Previous coronary CT showed moderate plaque in LAD and mild plaque in his RCA, FFR negative.  Chest discomfort does not appear to be related to cardiac issues. No plans for ischemic evaluation at this time  Shortness of breath-noted with episodes of chest discomfort.  Denies palpitations.  Previous cardiac event monitor showed normal sinus rhythm, rare junctional tachycardia, and occasional SVT and PAT with no pauses or atrial fibrillation.  Patient sedentary since CVA.  Suspect shortness of breath is multifactorial related to episodes of palpitations versus deconditioning related to inactivity. Order echocardiogram Order 7-day cardiac event monitor  Essential hypertension-BP today***. Maintain blood pressure log Low-sodium diet  Hyperlipidemia-LDL***. High-fiber diet Continue rosuvastatin  Dizziness- improving since 11/10/2023 hospital admission.  Blood pressure today***.  Do not suspect cardiac involvement. Continue Keppra and Zonegran Maintain p.o. hydration Change positions slowly  CVA, hemiplegic migraine-felt to be more likely due to hemiplegic migraine.  CVA did not present on head CT.  Notes headaches about 2-3 times per week.  Speech today***. Following with neurology  Disposition: Follow-up with Dr. Anne Fu or APP after diagnostic testing    Past Medical History:  Diagnosis Date   Abdominal pain 08/30/2019   Alcohol withdrawal (HCC) 07/29/2013   Allergy    Anxiety    Arthritis    Benzodiazepine abuse (HCC) 08/30/2019   Bipolar disorder (HCC)    CAD (coronary artery disease)    Carotid arterial disease (HCC)    Chronic insomnia 06/06/2015   Chronic low back  pain 12/28/2013   Chronic migraine without aura, with intractable migraine, so stated, with status migrainosus 08/25/2019   Claustrophobia    Cocaine abuse (HCC) 05/22/2015   COPD (chronic obstructive pulmonary disease) (HCC)    Delirium tremens (HCC) 07/29/2013   Epilepsy (HCC)    last seizure week of 06-25-2020- sev. grand mal seizures per pt    Exanthem due to herpes zoster 04/01/2021   Facial droop 09/07/2022   Fall at home, initial encounter 09/07/2022   Hemiplegic migraine with status migrainosus 06/06/2015   Hypertension 12/04/2013   Migraine    Multiple rib fractures 09/08/2022   Opioid use disorder 08/30/2019   Osteoarthritis of left wrist 01/04/2020   PAD (peripheral artery disease) (HCC)    PONV (postoperative nausea and vomiting)    Prolonged Q-T interval on ECG    Rib fractures 09/07/2022   Sinus bradycardia 09/07/2022   Stroke [facility])    patient denies- states had a hemiplegic migraine in ~2011   Substance abuse (HCC)    Syncope 09/07/2022    Past Surgical History:  Procedure Laterality Date   ANKLE SURGERY     in high school   APPENDECTOMY     COLONOSCOPY     KNEE ARTHROSCOPY Right    NOSE SURGERY     REVERSE SHOULDER  ARTHROPLASTY Right 03/27/2022   Procedure: REVERSE SHOULDER ARTHROPLASTY;  Surgeon: Bjorn Pippin, MD;  Location: [facility];  Service: Orthopedics;  Laterality: Right;   SHOULDER ARTHROSCOPY WITH DISTAL CLAVICLE RESECTION Right 12/04/2021   Procedure: SHOULDER ARTHROSCOPY WITH DISTAL CLAVICLE EXCISION;  Surgeon: Bjorn Pippin, MD;  Location: [facility];  Service: Orthopedics;  Laterality: Right;   SHOULDER ARTHROSCOPY WITH SUBACROMIAL DECOMPRESSION, ROTATOR CUFF REPAIR AND BICEP TENDON REPAIR Right 12/04/2021   Procedure: SHOULDER ARTHROSCOPY WITH SUBACROMIAL DECOMPRESSION, ROTATOR CUFF REPAIR AND BICEP TENDON REPAIR;  Surgeon: Bjorn Pippin, MD;  Location: [facility];  Service: Orthopedics;  Laterality:  Right;    Current Medications: No outpatient medications have been marked as taking for the 01/07/24 encounter (Appointment) with Marcelino Duster, PA.     Allergies:   Sertraline, Codeine, Depakote [divalproex sodium], Migranal [dihydroergotamine], and Topamax [topiramate]   Social History   Socioeconomic History   Marital status: Legally Separated    Spouse name: Not on file   Number of children: 0   Years of education: GED   Highest education level: Not on file  Occupational History   Occupation: disabled  Tobacco Use   Smoking status: Every Day    Current packs/day: 0.25    Average packs/day: 0.3 packs/day for 15.0 years (3.8 ttl pk-yrs)    Types: Cigarettes   Smokeless tobacco: Never   Tobacco comments:    States he quit 02-25-22  Vaping Use   Vaping status: Never Used  Substance and Sexual Activity   Alcohol use: Yes    Alcohol/week: 6.0 standard drinks of alcohol    Types: 6 Cans of beer per week    Comment: one a night   Drug use: Yes    Types: Marijuana    Comment: daily   Sexual activity: Yes    Birth control/protection: Condom  Other Topics Concern   Not on file  Social History Narrative   Pt lives with sister.   Patient drinks 2 cups of caffeine daily.   Patient is right handed.   Social Drivers of Corporate investment banker Strain: Low Risk  (11/12/2023)   [facility]   Overall Financial Resource Strain (CARDIA)    Difficulty of Paying Living Expenses: Not hard at all  Food Insecurity: No Food Insecurity (11/12/2023)   Received from [facility]   Hunger Vital Sign    Worried About Running Out of Food in the Last Year: Never true    Ran Out of Food in the Last Year: Never true  Transportation Needs: No Transportation Needs (11/12/2023)   [facility] from [facility] - Transportation    Lack of Transportation (Medical): No    Lack of Transportation (Non-Medical): No  Recent Concern: Transportation Needs -  Unmet Transportation Needs (09/30/2023)   PRAPARE - Transportation    Lack of Transportation (Medical): Yes    Lack of Transportation (Non-Medical): Yes  Physical Activity: Sufficiently Active (09/30/2023)   Exercise Vital Sign    Days of Exercise per Week: 5 days    Minutes of Exercise per Session: 30 min  Stress: No Stress Concern Present (09/30/2023)   [employer] of Occupational Health - Occupational Stress Questionnaire    Feeling of Stress : Not at all  Social Connections: Moderately Isolated (09/30/2023)   Social Connection and Isolation Panel [NHANES]    Frequency of Communication with Friends and Family: More than three times a  week    Frequency of Social Gatherings with Friends and Family: More than three times a week    Attends Religious Services: 1 to 4 times per year    Active Member of [employer] or Organizations: No    Attends Engineer, structural: Never    Marital Status: Separated     Family History: The patient's ***family history includes Cancer in his cousin and father; Colon cancer in his cousin; Colon cancer (age of onset: 76) in his sister; Epilepsy in his mother; Heart disease in his father and mother; Heart failure in his mother; Hypertension in his father; Melanoma in his father; Migraines in his father; Stroke in his mother. There is no history of Colon polyps, Esophageal cancer, Rectal cancer, or Stomach cancer.  ROS:   Please see the history of present illness.    *** All other systems reviewed and are negative.  EKGs/Labs/Other Studies Reviewed:    The following studies were reviewed today: ***      Recent Labs: No results found for requested labs within last 365 days.  Recent Lipid Panel    Component Value Date/Time   CHOL 143 09/09/2022 0422   CHOL 163 07/13/2020 0921   TRIG 85 09/09/2022 0422   HDL 63 09/09/2022 0422   HDL 101 07/13/2020 0921   CHOLHDL 2.3 09/09/2022 0422   VLDL 17 09/09/2022 0422   LDLCALC 63 09/09/2022 0422    LDLCALC 50 07/13/2020 0921     Risk Assessment/Calculations:   {Does this patient have ATRIAL FIBRILLATION?:[phone]}  No BP recorded.  {Refresh Note OR Click here to enter BP  :1}***         Physical Exam:    VS:  There were no vitals taken for this visit.    Wt Readings from Last 3 Encounters:  11/20/23 146 lb (66.2 kg)  09/30/23 159 lb (72.1 kg)  05/27/23 159 lb (72.1 kg)     GEN: *** Well nourished, well developed in no acute distress HEENT: Normal NECK: No JVD; No carotid bruits LYMPHATICS: No lymphadenopathy CARDIAC: ***RRR, no murmurs, rubs, gallops RESPIRATORY:  Clear to auscultation without rales, wheezing or rhonchi  ABDOMEN: Soft, non-tender, non-distended MUSCULOSKELETAL:  No edema; No deformity  SKIN: Warm and dry NEUROLOGIC:  Alert and oriented x 3 PSYCHIATRIC:  Normal affect   ASSESSMENT:    No diagnosis found. PLAN:    In order of problems listed above:  ***      {Are you ordering a CV Procedure (e.g. stress test, cath, DCCV, TEE, etc)?   Press F2        :[id number]}    Medication Adjustments/Labs and Tests Ordered: Current medicines are reviewed at length with the patient today.  Concerns regarding medicines are outlined above.  No orders of the defined types were placed in this encounter.  No orders of the defined types were placed in this encounter.   There are no Patient Instructions on file for this visit.   Signed, Marcelino Duster, [place]  01/03/2024 5:39 PM    [facility]

## 2024-01-07 ENCOUNTER — Ambulatory Visit: Payer: 59 | Admitting: Physician Assistant

## 2024-01-20 NOTE — Progress Notes (Signed)
 Cardiology Office Note:    Date:  01/22/2024   ID:  Spencer Mendoza, DOB [birth year], MRN [id number]  PCP:  Dulce Sellar, NP   [facility] Providers Cardiologist:  Donato Schultz, MD Cardiology APP:  Marcelino Duster, PA     Referring MD: Dulce Sellar, NP   Chief Complaint  Patient presents with   Follow-up    Chest pain    History of Present Illness:    Spencer Mendoza is a 63 y.o. male with a hx of nonobstructive CAD (coronary CT 5/21), vasovagal syncope, PVD (stenosis of IMA, bilateral iliac arteries and less than 50% distal aorta seen by [facility] 2021), carotid artery disease (1-49% BICA by duplex 10/23), sinus bradycardia, PSVT, PAT, junctional tachycardia by monitor 2021, alcohol abuse, prior cocaine abuse, epilepsy, migraines, COPD, emphysema/possible pulmonary fibrosis by prior CT.  He was seen and evaluated by Dr. Anne Fu in 2021 for unusual episodes of recurrent LOC.  Echocardiogram showed an EF of 50-55%, no regional wall motion abnormalities, mild MR.  Coronary CT showed coronary calcium score of 330 with moderate plaque in his LAD and mild and RCA, FFR negative, cardiac event monitor showed normal sinus rhythm, rare junctional tachycardia, occasional SVT/PAT and no pauses or atrial fibrillation.  His syncope was felt to be related to vasovagal/noncardiac in etiology.  He was admitted to the hospital 10/23 with multiple recurrent episodes of LOC, 4 out of 5 nights prior to admission.  He reported 10 episodes in the last 6 months.  They were noted primarily with getting up to go to the bathroom at night.  His sister reported that he was unconscious for 30-40 minutes with urinary incontinence. He has also had stool incontinence with prior syncopal episodes.  He denied any prodrome of symptoms except for occasional episodes feeling like his heart was not beating quite right and slower.  He had lost around 40 pounds unintentionally in the prior year.  He reported  smoking 2 to 3 cigarettes/day and drinking 1 beer every other day.  He denied illicit drug use aside from marijuana.  His chest x-ray showed patchy opacity.  CT chest showed rib fracture, aorta and coronary atherosclerosis, emphysematous changes and no PE.  Head CT showed no acute changes.  Orthostatic vitals were negative he was advised to not drive for 6 months.  He was admitted 09/21/2022 with delirium due to EtOH intoxication and left AMA.  He was seen in follow-up by Ronie Spies, PA-C on 10/15/2022.  During that time he presented with another person named Diane who he had known for quite some time.  He was not taking midodrine due to not having refills.  He reports that he previously stopped ASA and rosuvastatin prior to a shoulder surgery because he was instructed to do so.  He denied recurrent LOC changes however he still periodically noted dizziness upon standing.  He did not have resting dizziness.  His main complaint was body pain which was chronic for him.  He reported a component of chest discomfort which was exacerbated with laying in certain positions.  His discomfort felt better with exertion.  He did not have angina.  He was seen in follow-up by his PCP on 11/20/2023.  He reported severe headaches and imbalance.  These were first felt to be related to CVA during a hospitalization 11/10/23. He presented 11/10/23 wit symptoms concerning for CVA, but symptoms started 2 weeks prior. MRI was ordered but he was unable to tolerate MRI due to claustrophobia.  He was placed on Keppra and Zonegran.  He was advised to follow-up with neurology, has not yet seen. Per notes, differential was CVA vs hemiplegic migraine; felt less likely CVA as CT head was negative.   He contacted the nurse triage line on 12/29/2023.  He reported that he had CVA on 11/10/2023.  Since that time he noted intermittent chest discomfort with shortness of breath, ongoing right sided weakness, and reported feeling dizzy.  He presents  to the clinic today for evaluation and states he is having chest pain and dizziness when getting up to urinate during the night.  He reports exertional chest pain and when laying down at night, accompanied by nausea, lightheadedness, diaphoresis, and heart racing. He has chest pain every day. CP lasts 5-7 minutes to 30 minutes.  He states the chest pain is worse with activity and relieved with rest.  He is concerned because he intensity of his chest pain has increased over the past 3 months.  He smokes 1 pack of cigarettes per week.  He reports last drink of alcohol was 3-4 weeks ago. No elicit drugs.  Has not used cocaine in 2 years.  Sister joins him at the appt. She has purchased house for him and his other sister to live in.    Past Medical History:  Diagnosis Date   Abdominal pain 08/30/2019   Alcohol withdrawal (HCC) 07/29/2013   Allergy    Anxiety    Arthritis    Benzodiazepine abuse (HCC) 08/30/2019   Bipolar disorder (HCC)    CAD (coronary artery disease)    Carotid arterial disease (HCC)    Chronic insomnia 06/06/2015   Chronic low back pain 12/28/2013   Chronic migraine without aura, with intractable migraine, so stated, with status migrainosus 08/25/2019   Claustrophobia    Cocaine abuse (HCC) 05/22/2015   COPD (chronic obstructive pulmonary disease) (HCC)    Delirium tremens (HCC) 07/29/2013   Epilepsy (HCC)    last seizure week of 06-25-2020- sev. grand mal seizures per pt    Exanthem due to herpes zoster 04/01/2021   Facial droop 09/07/2022   Fall at home, initial encounter 09/07/2022   Hemiplegic migraine with status migrainosus 06/06/2015   Hypertension 12/04/2013   Migraine    Multiple rib fractures 09/08/2022   Opioid use disorder 08/30/2019   Osteoarthritis of left wrist 01/04/2020   PAD (peripheral artery disease) (HCC)    PONV (postoperative nausea and vomiting)    Prolonged Q-T interval on ECG    Rib fractures 09/07/2022   Sinus bradycardia 09/07/2022    Stroke [facility])    patient denies- states had a hemiplegic migraine in ~2011   Substance abuse (HCC)    Syncope 09/07/2022    Past Surgical History:  Procedure Laterality Date   ANKLE SURGERY     in high school   APPENDECTOMY     COLONOSCOPY     KNEE ARTHROSCOPY Right    NOSE SURGERY     REVERSE SHOULDER ARTHROPLASTY Right 03/27/2022   Procedure: REVERSE SHOULDER ARTHROPLASTY;  Surgeon: Bjorn Pippin, MD;  Location: [facility];  Service: Orthopedics;  Laterality: Right;   SHOULDER ARTHROSCOPY WITH DISTAL CLAVICLE RESECTION Right 12/04/2021   Procedure: SHOULDER ARTHROSCOPY WITH DISTAL CLAVICLE EXCISION;  Surgeon: Bjorn Pippin, MD;  Location: [facility];  Service: Orthopedics;  Laterality: Right;   SHOULDER ARTHROSCOPY WITH SUBACROMIAL DECOMPRESSION, ROTATOR CUFF REPAIR AND BICEP TENDON REPAIR Right 12/04/2021   Procedure: SHOULDER ARTHROSCOPY WITH SUBACROMIAL DECOMPRESSION,  ROTATOR CUFF REPAIR AND BICEP TENDON REPAIR;  Surgeon: Bjorn Pippin, MD;  Location: [facility];  Service: Orthopedics;  Laterality: Right;    Current Medications: Current Meds  Medication Sig   acetaminophen (TYLENOL) 500 MG tablet Take 1,000 mg by mouth 2 (two) times daily as needed (pain).   albuterol (VENTOLIN HFA) 108 (90 Base) MCG/ACT inhaler INHALE 2 PUFFS BY MOUTH EVERY 4 HOURS AS NEEDED FOR SHORTNESS OF BREATH OR WHEEZING   aspirin EC 81 MG tablet Take 1 tablet (81 mg total) by mouth daily. Swallow whole.   levETIRAcetam (KEPPRA) 1000 MG tablet Take 1 tablet (1,000 mg total) by mouth 2 (two) times daily.   nitroGLYCERIN (NITROSTAT) 0.4 MG SL tablet Place 1 tablet (0.4 mg total) under the tongue every 5 (five) minutes as needed for chest pain.   Probiotic Product (PROBIOTIC BLEND PO) Take by mouth.   rosuvastatin (CRESTOR) 20 MG tablet Take 1 tablet (20 mg total) by mouth daily.   SYMBICORT 160-4.5 MCG/ACT inhaler Inhale 2 puffs into the lungs 2 (two) times daily.    traZODone (DESYREL) 100 MG tablet TAKE 1 TO 2 TABLETS(100 TO 200 MG) BY MOUTH AT BEDTIME   zonisamide (ZONEGRAN) 100 MG capsule TAKE 2 CAPSULES(200 MG) BY MOUTH DAILY (Patient taking differently: Take 100 mg by mouth.)   [DISCONTINUED] rosuvastatin (CRESTOR) 10 MG tablet TAKE 1 TABLET(10 MG) BY MOUTH DAILY     Allergies:   Sertraline, Codeine, Depakote [divalproex sodium], Migranal [dihydroergotamine], and Topamax [topiramate]   Social History   Socioeconomic History   Marital status: Legally Separated    Spouse name: Not on file   Number of children: 0   Years of education: GED   Highest education level: Not on file  Occupational History   Occupation: disabled  Tobacco Use   Smoking status: Every Day    Current packs/day: 0.25    Average packs/day: 0.3 packs/day for 15.0 years (3.8 ttl pk-yrs)    Types: Cigarettes   Smokeless tobacco: Never   Tobacco comments:    States he quit 02-25-22  Vaping Use   Vaping status: Never Used  Substance and Sexual Activity   Alcohol use: Yes    Alcohol/week: 6.0 standard drinks of alcohol    Types: 6 Cans of beer per week    Comment: one a night   Drug use: Yes    Types: Marijuana    Comment: daily   Sexual activity: Yes    Birth control/protection: Condom  Other Topics Concern   Not on file  Social History Narrative   Pt lives with sister.   Patient drinks 2 cups of caffeine daily.   Patient is right handed.   Social Drivers of Corporate investment banker Strain: Low Risk  (11/12/2023)   Received from [facility]   Overall Financial Resource Strain (CARDIA)    Difficulty of Paying Living Expenses: Not hard at all  Food Insecurity: No Food Insecurity (11/12/2023)   Received from [facility]   Hunger Vital Sign    Worried About Running Out of Food in the Last Year: Never true    Ran Out of Food in the Last Year: Never true  Transportation Needs: No Transportation Needs (11/12/2023)   Received from [facility] - Transportation    Lack of Transportation (Medical): No    Lack of Transportation (Non-Medical): No  Recent Concern: Transportation Needs - Unmet Transportation Needs (09/30/2023)   PRAPARE -  Administrator, Civil Service (Medical): Yes    Lack of Transportation (Non-Medical): Yes  Physical Activity: Sufficiently Active (09/30/2023)   Exercise Vital Sign    Days of Exercise per Week: 5 days    Minutes of Exercise per Session: 30 min  Stress: No Stress Concern Present (09/30/2023)   [employer] of Occupational Health - Occupational Stress Questionnaire    Feeling of Stress : Not at all  Social Connections: Moderately Isolated (09/30/2023)   Social Connection and Isolation Panel [NHANES]    Frequency of Communication with Friends and Family: More than three times a week    Frequency of Social Gatherings with Friends and Family: More than three times a week    Attends Religious Services: 1 to 4 times per year    Active Member of [employer] or Organizations: No    Attends Banker Meetings: Never    Marital Status: Separated     Family History: The patient's family history includes Cancer in his cousin and father; Colon cancer in his cousin; Colon cancer (age of onset: 25) in his sister; Epilepsy in his mother; Heart disease in his father and mother; Heart failure in his mother; Hypertension in his father; Melanoma in his father; Migraines in his father; Stroke in his mother. There is no history of Colon polyps, Esophageal cancer, Rectal cancer, or Stomach cancer.  ROS:   Please see the history of present illness.     All other systems reviewed and are negative.  EKGs/Labs/Other Studies Reviewed:    The following studies were reviewed today:  CT coronary 2021 IMPRESSION: 1.  Poor quality study due to motion.   2. Coronary calcium score of 330. This was 94th percentile for age and sex matched control.   2.  Normal coronary origin with right  dominance.   3. Moderate plaque in the mid LAD with mild plaque in the RCA. Given the motion artifact this study is not a good candidate for FFRct.   4. Recommend aggressive risk factor modification including high potency statin. If symptoms persist, would have a low threshold to consider cardiac catheterization.   EKG Interpretation Date/Time:  Friday January 22 2024 08:02:21 EST Ventricular Rate:  58 PR Interval:  194 QRS Duration:  110 QT Interval:  410 QTC Calculation: 402 R Axis:   112  Text Interpretation: Sinus bradycardia Right axis deviation Incomplete right bundle branch block Septal infarct (cited on or before 26-Jun-2022) When compared with ECG of 07-Sep-2022 15:35, Incomplete right bundle branch block is now Present Confirmed by Micah Flesher ([id number]) on 01/22/2024 8:07:43 AM    Recent Labs: No results found for requested labs within last 365 days.  Recent Lipid Panel    Component Value Date/Time   CHOL 143 09/09/2022 0422   CHOL 163 07/13/2020 0921   TRIG 85 09/09/2022 0422   HDL 63 09/09/2022 0422   HDL 101 07/13/2020 0921   CHOLHDL 2.3 09/09/2022 0422   VLDL 17 09/09/2022 0422   LDLCALC 63 09/09/2022 0422   LDLCALC 50 07/13/2020 0921     Risk Assessment/Calculations:                Physical Exam:    VS:  BP 124/72   Pulse (!) 58   Ht 6' (1.829 m)   Wt 145 lb 12.8 oz (66.1 kg)   SpO2 94%   BMI 19.77 kg/m     Wt Readings from Last 3 Encounters:  01/22/24 145 lb 12.8 oz (  66.1 kg)  11/20/23 146 lb (66.2 kg)  09/30/23 159 lb (72.1 kg)     GEN:  Well nourished, well developed in no acute distress HEENT: Normal NECK: No JVD; No carotid bruits LYMPHATICS: No lymphadenopathy CARDIAC: RRR, no murmurs, rubs, gallops RESPIRATORY:  Clear to auscultation without rales, wheezing or rhonchi  ABDOMEN: Soft, non-tender, non-distended MUSCULOSKELETAL:  No edema; No deformity  SKIN: Warm and dry NEUROLOGIC:  Alert and oriented x 3 PSYCHIATRIC:  Normal  affect   ASSESSMENT:    1. Syncope and collapse   2. PSVT (paroxysmal supraventricular tachycardia) (HCC)   3. Chest pain, unspecified type   4. Coronary artery disease of native artery of native heart with stable angina pectoris (HCC)   5. Hyperlipidemia LDL goal <70   6. Vasovagal syncope   7. History of stroke    PLAN:    In order of problems listed above:  Chest pain - He underwent CT coronary in 2021 that was FFR negative - CT imaging does show PAD - Given his progression of symptoms we will first obtain an echocardiogram - If that echo is largely unremarkable, we will repeat a CT coronary.  If the echocardiogram is abnormal, we will proceed with definitive angiography -Will keep close follow-up - I changed his chewable aspirin to EC, and added nitro glycerin SL -- increase crestor to 20 mg -Given his blood pressure and ongoing orthostatic dizziness, I will not add amlodipine or Imdur at this time - I did give him strict ER precautions should the chest pain progress or he takes nitroglycerin  Per CT coronary read, he has nonobstructive disease but image quality was poor due to artifact. I discussed repeating a CT coronary vs heart catheterization, which gave him pause. I discussed obtaining an echocardiogram first. Will then likely need to consider heart catheterization, but will discuss with the patient.  Will also need to consider recent admission for stroke, although it sounds like migraine was favored by neurology.   History of polysubstance abuse - History of alcohol and cocaine use - Has not used cocaine in greater than 2 years - Has not had alcohol in 3 weeks   Hyperlipidemia with LDL goal < 70 Need updated lipid panel - will obtain with labs prior to CT coronary / heart cath - increase crestor to 20 mg   Syncope - may need to consider a heart monitor - sounds like orthostatic dizziness - no recent events   CVA, hemiplegic migraine - ?10/2023 - felt to  be more likely due to hemiplegic migraine.  CVA did not present on head CT.  Notes headaches about 2-3 times per week Following with neurology   Follow up soon after echo.             Medication Adjustments/Labs and Tests Ordered: Current medicines are reviewed at length with the patient today.  Concerns regarding medicines are outlined above.  Orders Placed This Encounter  Procedures   EKG 12-Lead   ECHOCARDIOGRAM COMPLETE   Meds ordered this encounter  Medications   rosuvastatin (CRESTOR) 20 MG tablet    Sig: Take 1 tablet (20 mg total) by mouth daily.    Dispense:  90 tablet    Refill:  3   nitroGLYCERIN (NITROSTAT) 0.4 MG SL tablet    Sig: Place 1 tablet (0.4 mg total) under the tongue every 5 (five) minutes as needed for chest pain.    Dispense:  25 tablet    Refill:  3  Patient Instructions  Medication Instructions:  Increase Crestor 20 mg daily Nitroglycerin .4 mg sl- take as needed for chest pain.     *If you need a refill on your cardiac medications before your next appointment, please call your pharmacy*   Lab Work: NONE ordered at this time of appointment   Testing/Procedures: Your physician has requested that you have an echocardiogram. Echocardiography is a painless test that uses sound waves to create images of your heart. It provides your doctor with information about the size and shape of your heart and how well your heart's chambers and valves are working. This procedure takes approximately one hour. There are no restrictions for this procedure. Please do NOT wear cologne, perfume, aftershave, or lotions (deodorant is allowed). Please arrive 15 minutes prior to your appointment time.  Please note: We ask at that you not bring children with you during ultrasound (echo/ vascular) testing. Due to room size and safety concerns, children are not allowed in the ultrasound rooms during exams. Our front office staff cannot provide observation of children in  our lobby area while testing is being conducted. An adult accompanying a patient to their appointment will only be allowed in the ultrasound room at the discretion of the ultrasound technician under special circumstances. We apologize for any inconvenience.    Follow-Up: At [facility], you and your health needs are our priority.  As part of our continuing mission to provide you with exceptional heart care, we have created designated Provider Care Teams.  These Care Teams include your primary Cardiologist (physician) and Advanced Practice Providers (APPs -  Physician Assistants and Nurse Practitioners) who all work together to provide you with the care you need, when you need it.  We recommend signing up for the patient portal called "MyChart".  Sign up information is provided on this After Visit Summary.  MyChart is used to connect with patients for Virtual Visits (Telemedicine).  Patients are able to view lab/test results, encounter notes, upcoming appointments, etc.  Non-urgent messages can be sent to your provider as well.   To learn more about what you can do with MyChart, go to [email or web address].    Your next appointment:    2-3 weeks post echo   Provider:   Micah Flesher, PA-C               Signed, Roe Rutherford Emmary Culbreath, [place]  01/22/2024 9:45 AM    [facility]

## 2024-01-22 ENCOUNTER — Encounter: Payer: Self-pay | Admitting: Physician Assistant

## 2024-01-22 ENCOUNTER — Ambulatory Visit: Attending: Physician Assistant | Admitting: Physician Assistant

## 2024-01-22 VITALS — BP 124/72 | HR 58 | Ht 72.0 in | Wt 145.8 lb

## 2024-01-22 DIAGNOSIS — R079 Chest pain, unspecified: Secondary | ICD-10-CM | POA: Diagnosis not present

## 2024-01-22 DIAGNOSIS — Z8673 Personal history of transient ischemic attack (TIA), and cerebral infarction without residual deficits: Secondary | ICD-10-CM

## 2024-01-22 DIAGNOSIS — R55 Syncope and collapse: Secondary | ICD-10-CM

## 2024-01-22 DIAGNOSIS — E785 Hyperlipidemia, unspecified: Secondary | ICD-10-CM

## 2024-01-22 DIAGNOSIS — I25118 Atherosclerotic heart disease of native coronary artery with other forms of angina pectoris: Secondary | ICD-10-CM

## 2024-01-22 DIAGNOSIS — I471 Supraventricular tachycardia, unspecified: Secondary | ICD-10-CM

## 2024-01-22 MED ORDER — NITROGLYCERIN 0.4 MG SL SUBL: 0.4000 mg | SUBLINGUAL_TABLET | SUBLINGUAL | 3 refills | Status: AC | PRN

## 2024-01-22 MED ORDER — ROSUVASTATIN CALCIUM 20 MG PO TABS: 20.0000 mg | ORAL_TABLET | Freq: Every day | ORAL | 3 refills | Status: DC

## 2024-01-22 NOTE — Patient Instructions (Signed)
 Medication Instructions:  Increase Crestor 20 mg daily Nitroglycerin .4 mg sl- take as needed for chest pain.     *If you need a refill on your cardiac medications before your next appointment, please call your pharmacy*   Lab Work: NONE ordered at this time of appointment   Testing/Procedures: Your physician has requested that you have an echocardiogram. Echocardiography is a painless test that uses sound waves to create images of your heart. It provides your doctor with information about the size and shape of your heart and how well your heart's chambers and valves are working. This procedure takes approximately one hour. There are no restrictions for this procedure. Please do NOT wear cologne, perfume, aftershave, or lotions (deodorant is allowed). Please arrive 15 minutes prior to your appointment time.  Please note: We ask at that you not bring children with you during ultrasound (echo/ vascular) testing. Due to room size and safety concerns, children are not allowed in the ultrasound rooms during exams. Our front office staff cannot provide observation of children in our lobby area while testing is being conducted. An adult accompanying a patient to their appointment will only be allowed in the ultrasound room at the discretion of the ultrasound technician under special circumstances. We apologize for any inconvenience.    Follow-Up: At [facility], you and your health needs are our priority.  As part of our continuing mission to provide you with exceptional heart care, we have created designated Provider Care Teams.  These Care Teams include your primary Cardiologist (physician) and Advanced Practice Providers (APPs -  Physician Assistants and Nurse Practitioners) who all work together to provide you with the care you need, when you need it.  We recommend signing up for the patient portal called "MyChart".  Sign up information is provided on this After Visit Summary.  MyChart  is used to connect with patients for Virtual Visits (Telemedicine).  Patients are able to view lab/test results, encounter notes, upcoming appointments, etc.  Non-urgent messages can be sent to your provider as well.   To learn more about what you can do with MyChart, go to [email or web address].    Your next appointment:    2-3 weeks post echo   Provider:   Micah Flesher, PA-C

## 2024-01-25 ENCOUNTER — Ambulatory Visit: Attending: Physician Assistant

## 2024-01-25 DIAGNOSIS — R079 Chest pain, unspecified: Secondary | ICD-10-CM

## 2024-01-25 LAB — ECHOCARDIOGRAM COMPLETE
AV Mean grad: 3 mmHg
AV Peak grad: 6.1 mmHg
Ao pk vel: 1.23 m/s
Area-P 1/2: 3.31 cm2
S' Lateral: 3.3 cm

## 2024-01-26 ENCOUNTER — Telehealth: Admitting: Family

## 2024-01-26 ENCOUNTER — Encounter: Payer: Self-pay | Admitting: Family

## 2024-01-26 DIAGNOSIS — F411 Generalized anxiety disorder: Secondary | ICD-10-CM | POA: Diagnosis not present

## 2024-01-26 DIAGNOSIS — F410 Panic disorder [episodic paroxysmal anxiety] without agoraphobia: Secondary | ICD-10-CM | POA: Diagnosis not present

## 2024-01-26 MED ORDER — CLONAZEPAM 0.5 MG PO TABS: 0.5000 mg | ORAL_TABLET | Freq: Every day | ORAL | Status: DC | PRN

## 2024-01-26 NOTE — Assessment & Plan Note (Signed)
 History of depression with poor response to previous medications. Recent increase in anxiety and distress over current health status. Pt refusing any anxiety medication other than a Benzo. Believe pt could benefit from a benzodiazepine for sx, also considering med interactions w/his seizure medications. Pt reports he is living with his sister now here in [place] and she is willing to take responsibility for giving him Klonopin 0.5mg  prn for panic attacks as prescribed, d/t pt hx of Benzo abuse.   -Pt sister to sign agreement for responsibility for administering Klonopin to pt  -Once agreement signed, will send RX -F/U in one month

## 2024-01-26 NOTE — Progress Notes (Signed)
 MyChart Video Visit    Virtual Visit via Video Note   This format is felt to be most appropriate for this patient at this time. Physical exam was limited by quality of the video and audio technology used for the visit. CMA was able to get the patient set up on a video visit.  Patient location: Home. Patient and provider in visit Provider location: Office  I discussed the limitations of evaluation and management by telemedicine and the availability of in person appointments. The patient expressed understanding and agreed to proceed.  Visit Date: 01/26/2024  Today's healthcare provider: Dulce Sellar, NP     Subjective:   Patient ID: Spencer Mendoza, male    DOB: 11-26-1960, 63 y.o.   MRN: [id number]  Chief Complaint  Patient presents with   Generalized Anxiety Disorder   HPI: Anxiety/Depression: Patient complains of anxiety disorder and panic attacks.  He has the following symptoms: feelings of losing control, insomnia, irritable, palpitations, psychomotor agitation, racing thoughts, sweating. Onset of symptoms was approximately  years ago, He denies current suicidal and homicidal ideation. Risk factors: negative life event medical issues, hx of abuse and previous episode of depression. Previous treatment includes anticonvulsants  , benzodiazepines  , neuroleptics  , Selective Seratonin Reuptake Inhibitor  , and Wellbutrin and individual therapy.  He complains of the following side effects from the treatment:  ineffective other than Benzodiazepines .    11/20/2023    1:19 PM  GAD 7 : Generalized Anxiety Score  Nervous, Anxious, on Edge 3  Control/stop worrying 3  Worry too much - different things 3  Trouble relaxing 3  Restless 3  Easily annoyed or irritable 3  Afraid - awful might happen 3  Total GAD 7 Score 21  Anxiety Difficulty Somewhat difficult    Assessment & Plan:  Generalized anxiety disorder with panic attacks Assessment & Plan: History of depression  with poor response to previous medications. Recent increase in anxiety and distress over current health status. Pt refusing any anxiety medication other than a Benzo. Believe pt could benefit from a benzodiazepine for sx, also considering med interactions w/his seizure medications. Pt reports he is living with his sister now here in [place] and she is willing to take responsibility for giving him Klonopin 0.5mg  prn for panic attacks as prescribed, d/t pt hx of Benzo abuse.   -Pt sister to sign agreement for responsibility for administering Klonopin to pt  -Once agreement signed, will send RX -F/U in one month    Past Medical History:  Diagnosis Date   Abdominal pain 08/30/2019   Alcohol withdrawal (HCC) 07/29/2013   Allergy    Anxiety    Arthritis    Benzodiazepine abuse (HCC) 08/30/2019   Bipolar disorder (HCC)    CAD (coronary artery disease)    Carotid arterial disease (HCC)    Chronic insomnia 06/06/2015   Chronic low back pain 12/28/2013   Chronic migraine without aura, with intractable migraine, so stated, with status migrainosus 08/25/2019   Claustrophobia    Cocaine abuse (HCC) 05/22/2015   COPD (chronic obstructive pulmonary disease) (HCC)    Delirium tremens (HCC) 07/29/2013   Epilepsy (HCC)    last seizure week of 06-25-2020- sev. grand mal seizures per pt    Exanthem due to herpes zoster 04/01/2021   Facial droop 09/07/2022   Fall at home, initial encounter 09/07/2022   Hemiplegic migraine with status migrainosus 06/06/2015   Hypertension 12/04/2013   Migraine    Multiple rib  fractures 09/08/2022   Opioid use disorder 08/30/2019   Osteoarthritis of left wrist 01/04/2020   PAD (peripheral artery disease) (HCC)    PONV (postoperative nausea and vomiting)    Prolonged Q-T interval on ECG    Rib fractures 09/07/2022   Sinus bradycardia 09/07/2022   Stroke [facility])    patient denies- states had a hemiplegic migraine in ~2011   Substance abuse (HCC)    Syncope 09/07/2022     Past Surgical History:  Procedure Laterality Date   ANKLE SURGERY     in high school   APPENDECTOMY     COLONOSCOPY     KNEE ARTHROSCOPY Right    NOSE SURGERY     REVERSE SHOULDER ARTHROPLASTY Right 03/27/2022   Procedure: REVERSE SHOULDER ARTHROPLASTY;  Surgeon: Bjorn Pippin, MD;  Location: [facility];  Service: Orthopedics;  Laterality: Right;   SHOULDER ARTHROSCOPY WITH DISTAL CLAVICLE RESECTION Right 12/04/2021   Procedure: SHOULDER ARTHROSCOPY WITH DISTAL CLAVICLE EXCISION;  Surgeon: Bjorn Pippin, MD;  Location: [facility];  Service: Orthopedics;  Laterality: Right;   SHOULDER ARTHROSCOPY WITH SUBACROMIAL DECOMPRESSION, ROTATOR CUFF REPAIR AND BICEP TENDON REPAIR Right 12/04/2021   Procedure: SHOULDER ARTHROSCOPY WITH SUBACROMIAL DECOMPRESSION, ROTATOR CUFF REPAIR AND BICEP TENDON REPAIR;  Surgeon: Bjorn Pippin, MD;  Location: [facility];  Service: Orthopedics;  Laterality: Right;    Outpatient Medications Prior to Visit  Medication Sig Dispense Refill   acetaminophen (TYLENOL) 500 MG tablet Take 1,000 mg by mouth 2 (two) times daily as needed (pain).     albuterol (VENTOLIN HFA) 108 (90 Base) MCG/ACT inhaler INHALE 2 PUFFS BY MOUTH EVERY 4 HOURS AS NEEDED FOR SHORTNESS OF BREATH OR WHEEZING 8.5 g 5   aspirin EC 81 MG tablet Take 1 tablet (81 mg total) by mouth daily. Swallow whole. 90 tablet 3   nitroGLYCERIN (NITROSTAT) 0.4 MG SL tablet Place 1 tablet (0.4 mg total) under the tongue every 5 (five) minutes as needed for chest pain. 25 tablet 3   Probiotic Product (PROBIOTIC BLEND PO) Take by mouth.     rosuvastatin (CRESTOR) 20 MG tablet Take 1 tablet (20 mg total) by mouth daily. 90 tablet 3   SYMBICORT 160-4.5 MCG/ACT inhaler Inhale 2 puffs into the lungs 2 (two) times daily.     traZODone (DESYREL) 100 MG tablet TAKE 1 TO 2 TABLETS(100 TO 200 MG) BY MOUTH AT BEDTIME 60 tablet 3   zonisamide (ZONEGRAN) 100 MG capsule TAKE 2  CAPSULES(200 MG) BY MOUTH DAILY (Patient taking differently: Take 100 mg by mouth.) 60 capsule 6   levETIRAcetam (KEPPRA) 1000 MG tablet Take 1 tablet (1,000 mg total) by mouth 2 (two) times daily. 180 tablet 3   No facility-administered medications prior to visit.    Allergies  Allergen Reactions   Sertraline Other (See Comments)    Erectile dysfunction   Codeine Nausea And Vomiting   Depakote [Divalproex Sodium] Other (See Comments)    Made patient shake   Migranal [Dihydroergotamine] Nausea And Vomiting   Topamax [Topiramate] Other (See Comments)    Made patient angry       Objective:   Physical Exam Vitals and nursing note reviewed.  Constitutional:      General: Pt is not in acute distress.    Appearance: Normal appearance.  HENT:     Head: Normocephalic.  Pulmonary:     Effort: No respiratory distress.  Musculoskeletal:     Cervical back: Normal range of motion.  Skin:    General: Skin is dry.     Coloration: Skin is not pale.  Neurological:     Mental Status: Pt is alert and oriented to person, place, and time.  Psychiatric:        Mood and Affect: Mood normal.   There were no vitals taken for this visit.  Wt Readings from Last 3 Encounters:  01/22/24 145 lb 12.8 oz (66.1 kg)  11/20/23 146 lb (66.2 kg)  09/30/23 159 lb (72.1 kg)      I discussed the assessment and treatment plan with the patient. The patient was provided an opportunity to ask questions and all were answered. The patient agreed with the plan and demonstrated an understanding of the instructions.   The patient was advised to call back or seek an in-person evaluation if the symptoms worsen or if the condition fails to improve as anticipated.  Dulce Sellar, NP [facility] [phone] (phone) [phone] (fax)  [facility]

## 2024-01-28 ENCOUNTER — Telehealth: Payer: Self-pay | Admitting: Neurology

## 2024-01-28 ENCOUNTER — Encounter: Payer: Self-pay | Admitting: Neurology

## 2024-01-28 ENCOUNTER — Ambulatory Visit: Payer: Medicare HMO | Admitting: Neurology

## 2024-01-28 VITALS — BP 130/70 | Ht 72.0 in | Wt 144.0 lb

## 2024-01-28 DIAGNOSIS — R569 Unspecified convulsions: Secondary | ICD-10-CM

## 2024-01-28 DIAGNOSIS — M25511 Pain in right shoulder: Secondary | ICD-10-CM

## 2024-01-28 DIAGNOSIS — G8929 Other chronic pain: Secondary | ICD-10-CM

## 2024-01-28 DIAGNOSIS — I639 Cerebral infarction, unspecified: Secondary | ICD-10-CM

## 2024-01-28 DIAGNOSIS — G43711 Chronic migraine without aura, intractable, with status migrainosus: Secondary | ICD-10-CM | POA: Diagnosis not present

## 2024-01-28 MED ORDER — ZONISAMIDE 100 MG PO CAPS: 200.0000 mg | ORAL_CAPSULE | Freq: Every evening | ORAL | 3 refills | Status: DC

## 2024-01-28 MED ORDER — LEVETIRACETAM 1000 MG PO TABS: 1000.0000 mg | ORAL_TABLET | Freq: Two times a day (BID) | ORAL | 3 refills | Status: AC

## 2024-01-28 NOTE — Telephone Encounter (Signed)
 Pt's insurance is only accepted at [facility]. They are currently at capacity with speech therapy and cannot accept. Can speech be removed so we can get him started on PT/OT?

## 2024-01-28 NOTE — Addendum Note (Signed)
 Addended byWindell Norfolk on: 01/28/2024 02:56 PM   Modules accepted: Orders

## 2024-01-28 NOTE — Telephone Encounter (Signed)
 [facility] will reach out to pt to set up.

## 2024-01-28 NOTE — Progress Notes (Signed)
 Patient: Spencer Mendoza Date of birth: 11/07/1961  Reason for Visit: follow up for seizure, migraine History from: patient Primary Neurologist: Dr. Teresa Coombs  HISTORY OF PRESENT ILLNESS: Today 01/28/24 Patient presents today for follow-up, he is accompanied by sister.  Last visit was a year ago.  Since then he has completed EEG which was negative, no epileptiform discharges, no seizures, and no events.  He tells me that he believes that he is still having seizures during the sleep because he will wake up feeling tired. He was admitted to in November 10 2023 for right-sided weakness and speech difficulty.  Patient tells me the symptoms started a week prior but since he did not get better he presented to the ED.  In the ED he did have a head CT which was negative for any acute abnormality but he could not obtain the MRI brain.  He was discharged home on  Aspirin and Statin.  At that time he was living in [place].  For the past 2 weeks he has returned to [facility], has not done any type of therapy.  He is still complaint of right-sided weakness.   INTERVAL HISTORY 01/29/2023 Spencer Mendoza presents today for follow-up, he is accompanied by sister.  Last visit was a year ago.  Since then he reports that he has been doing okay but still continues to have his petit-mal seizures.  He described the seizures as eye blinking, then eyes will close.  Sometimes he can go into a staring, unresponsive.  At most the time he will feel a shock like sensation run thru his brain then he cannot remember anything else.  He will also have a lot of anxiety after a seizure.  He is on the Keppra 1000 mg twice daily.  He reported he still having these petit-mal seizures daily but the last generalized seizure was 4 months ago. He is pending EMU admission for capture and characterization.   From his chart review, he was admitted in November 2023 for altered mental status, his alcohol level was 214.  Again he was admitted in  October, 2023 for syncope and found to have multiple fractures. He reports his last alcohol drink was on February 4 as on February 3 he was arrested with a DUI and since then he has not had any alcohol in his system.  When he comes to the headaches, he still having headaches, due to his insurance issues, he cannot afford Botox.  Currently he is not taking any prescribed medicine for the headaches.     INTERVAL HISTORY 03/04/2022:  Spencer Mendoza here today for follow-up. Admitted to [facility] October 2022 after suicide attempt overdosing on trazodone. Having right shoulder replacement, needs pre-op clearance, had right rotator cuff repair that failed after his dog jumped on him. Pending scheduling. Having migraines few times a week, takes Tylenol, doesn't help much. Has been chronic,. On keppra 1000 mg twice daily, not missing dosing. No seizures that he can recall, in Feb, few days after right rotator cuff surgery, dog jumped on him, injured his surgical repair, not sleeping, a lot of pain (arm, migraines), blacked out, but claims wasn't seizure, endorsed by significant other.  Last Botox injection was in March 2021, was more than 75% helpful. Would like to restart Botox. He stopped smoking for surgery.   Update 02/25/2021 SS: Spencer Mendoza is a 63 year old male with history of episodic seizures associated with generalized tonic-clonic events, bipolar disorder, and migraine headaches.  Remains on Keppra 1000 mg twice  daily, denies any seizure events in the last 6 months.  On average, 2 migraines a month, he keeps a Decadron taper on hand in the event of a prolonged headache, for mild headache takes Tylenol or BCs.  Claims he may only use once Decadron every other month.  Was last sent in January by his PCP.  Botox has been most beneficial, but he has not been able to afford the Botox co-pay.  Claims his mental health is doing well, can no longer get Xanax.  Takes BuSpar.  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  He is involved in his church.  He presents today for evaluation accompanied by his fiance, Lafonda Mosses.  Update 08/27/2020 SS: Spencer Mendoza is a 63 year old male with history of episodic seizures associated with generalized tonic-clonic events, bipolar disorder, and migraine headaches.  He is on trazodone 200 mg at bedtime, Keppra 1000 mg twice a day, and received Botox therapy.  At last visit, Keppra was increased due to reported seizure 02/15/2020.  No seizure since.  Has had weight loss, has stabilized around 152 lbs, was 155 lbs 6 month ago.  Had colonoscopy, a few precancer polyps removed.  No etiology for weight loss found.  Recently having significant panic attacks, PCP put him on a short course of Xanax PRN, seen new [facility] psychiatry in November.  Not working, on disability.  More headache, could not afford Botox co-pay, last Botox in March 2021, Botox was really beneficial for him.  Continues to smoke cigarettes, we talked about history of epilepsy versus pseudoseizures, indicates epilepsy since he was born, his mom had epilepsy.  No EtOH in over 1 year.  Here today accompanied by his significant other.  HISTORY 02/23/2020 SS: Spencer Mendoza is a 63 year old male with history of episodic seizures associated with generalized tonic-clonic events, bipolar disorder, and migraine headache.  He is taking trazodone 200 mg at bedtime, Keppra 750 mg twice a day.  He received Botox therapy in November and March for migraines.  Botox has been very helpful for his headaches, did have 1 severe headache following his Botox injection in March, keeps Decadron taper on hand, took this with good benefit once in March.  Overall, headaches doing well.  He may have had a seizure 02/15/2020, he thinks that he did, is unclear of details, worked that day, was stressed out, came home, had preseizure feeling, laid down, unclear what happened next or does not want to elaborate.  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 Yesterday complained of chest pain, pain in his left arm, shortness of breath, vomiting.  Denies any EtOH, drug use.  Is smoking half pack a day.  In less than 2 months, has lost about 15 pounds unintentionally.  He presents today accompanied by his fiance Lafonda Mosses.  REVIEW OF SYSTEMS: Out of a complete 14 system review of symptoms, the patient complains only of the following symptoms, and all other reviewed systems are negative.  See HPI  ALLERGIES: Allergies  Allergen Reactions   Sertraline Other (See Comments)    Erectile dysfunction   Codeine Nausea And Vomiting   Depakote [Divalproex Sodium] Other (See Comments)    Made patient shake   Migranal [Dihydroergotamine] Nausea And Vomiting   Topamax [Topiramate] Other (See Comments)    Made patient angry    HOME MEDICATIONS: Outpatient Medications Prior  to Visit  Medication Sig Dispense Refill   acetaminophen (TYLENOL) 500 MG tablet Take 1,000 mg by mouth 2 (two) times daily as needed (pain).     albuterol (VENTOLIN HFA) 108 (90 Base) MCG/ACT inhaler INHALE 2 PUFFS BY MOUTH EVERY 4 HOURS AS NEEDED FOR SHORTNESS OF BREATH OR WHEEZING 8.5 g 5   aspirin EC 81 MG tablet Take 1 tablet (81 mg total) by mouth daily. Swallow whole. 90 tablet 3   clonazePAM (KLONOPIN) 0.5 MG tablet Take 1 tablet (0.5 mg total) by mouth daily as needed for anxiety (For panic attacks.).     nitroGLYCERIN (NITROSTAT) 0.4 MG SL tablet Place 1 tablet (0.4 mg total) under the tongue every 5 (five) minutes as needed for chest pain. 25 tablet 3   Probiotic Product (PROBIOTIC BLEND PO) Take by mouth.     rosuvastatin (CRESTOR) 20 MG tablet Take 1 tablet (20 mg total) by mouth daily. 90 tablet 3   SYMBICORT 160-4.5 MCG/ACT inhaler Inhale 2 puffs into the lungs 2 (two) times daily.     traZODone (DESYREL) 100 MG tablet TAKE 1 TO 2 TABLETS(100 TO 200 MG) BY  MOUTH AT BEDTIME 60 tablet 3   zonisamide (ZONEGRAN) 100 MG capsule TAKE 2 CAPSULES(200 MG) BY MOUTH DAILY (Patient taking differently: Take 100 mg by mouth.) 60 capsule 6   levETIRAcetam (KEPPRA) 1000 MG tablet Take 1 tablet (1,000 mg total) by mouth 2 (two) times daily. 180 tablet 3   No facility-administered medications prior to visit.    PAST MEDICAL HISTORY: Past Medical History:  Diagnosis Date   Abdominal pain 08/30/2019   Alcohol withdrawal (HCC) 07/29/2013   Allergy    Anxiety    Arthritis    Benzodiazepine abuse (HCC) 08/30/2019   Bipolar disorder (HCC)    CAD (coronary artery disease)    Carotid arterial disease (HCC)    Chronic insomnia 06/06/2015   Chronic low back pain 12/28/2013   Chronic migraine without aura, with intractable migraine, so stated, with status migrainosus 08/25/2019   Claustrophobia    Cocaine abuse (HCC) 05/22/2015   COPD (chronic obstructive pulmonary disease) (HCC)    Delirium tremens (HCC) 07/29/2013   Epilepsy (HCC)    last seizure week of 06-25-2020- sev. grand mal seizures per pt    Exanthem due to herpes zoster 04/01/2021   Facial droop 09/07/2022   Fall at home, initial encounter 09/07/2022   Hemiplegic migraine with status migrainosus 06/06/2015   Hypertension 12/04/2013   Migraine    Multiple rib fractures 09/08/2022   Opioid use disorder 08/30/2019   Osteoarthritis of left wrist 01/04/2020   PAD (peripheral artery disease) (HCC)    PONV (postoperative nausea and vomiting)    Prolonged Q-T interval on ECG    Rib fractures 09/07/2022   Sinus bradycardia 09/07/2022   Stroke [facility])    patient denies- states had a hemiplegic migraine in ~2011   Substance abuse (HCC)    Syncope 09/07/2022    PAST SURGICAL HISTORY: Past Surgical History:  Procedure Laterality Date   ANKLE SURGERY     in high school   APPENDECTOMY     COLONOSCOPY     KNEE ARTHROSCOPY Right    NOSE SURGERY     REVERSE SHOULDER ARTHROPLASTY Right 03/27/2022    Procedure: REVERSE SHOULDER ARTHROPLASTY;  Surgeon: Bjorn Pippin, MD;  Location: [facility];  Service: Orthopedics;  Laterality: Right;   SHOULDER ARTHROSCOPY WITH DISTAL CLAVICLE RESECTION Right 12/04/2021   Procedure: SHOULDER ARTHROSCOPY  WITH DISTAL CLAVICLE EXCISION;  Surgeon: Bjorn Pippin, MD;  Location: [facility];  Service: Orthopedics;  Laterality: Right;   SHOULDER ARTHROSCOPY WITH SUBACROMIAL DECOMPRESSION, ROTATOR CUFF REPAIR AND BICEP TENDON REPAIR Right 12/04/2021   Procedure: SHOULDER ARTHROSCOPY WITH SUBACROMIAL DECOMPRESSION, ROTATOR CUFF REPAIR AND BICEP TENDON REPAIR;  Surgeon: Bjorn Pippin, MD;  Location:  SURGERY CENTER;  Service: Orthopedics;  Laterality: Right;    FAMILY HISTORY: Family History  Problem Relation Age of Onset   Heart disease Mother    Stroke Mother    Epilepsy Mother    Heart failure Mother    Cancer Father    Heart disease Father    Hypertension Father    Melanoma Father    Migraines Father    Colon cancer Sister 59   Colon cancer Cousin    Cancer Cousin    Colon polyps Neg Hx    Esophageal cancer Neg Hx    Rectal cancer Neg Hx    Stomach cancer Neg Hx     SOCIAL HISTORY: Social History   Socioeconomic History   Marital status: Legally Separated    Spouse name: Not on file   Number of children: 0   Years of education: GED   Highest education level: Not on file  Occupational History   Occupation: disabled  Tobacco Use   Smoking status: Every Day    Current packs/day: 0.25    Average packs/day: 0.3 packs/day for 15.0 years (3.8 ttl pk-yrs)    Types: Cigarettes   Smokeless tobacco: Never   Tobacco comments:    States he quit 02-25-22  Vaping Use   Vaping status: Never Used  Substance and Sexual Activity   Alcohol use: Yes    Alcohol/week: 6.0 standard drinks of alcohol    Types: 6 Cans of beer per week    Comment: one a night   Drug use: Yes    Types: Marijuana    Comment: daily    Sexual activity: Yes    Birth control/protection: Condom  Other Topics Concern   Not on file  Social History Narrative   Pt lives with sister.   Patient drinks 2 cups of caffeine daily.   Patient is right handed.   Social Drivers of Corporate investment banker Strain: Low Risk  (11/12/2023)   Received from [facility]   Overall Financial Resource Strain (CARDIA)    Difficulty of Paying Living Expenses: Not hard at all  Food Insecurity: No Food Insecurity (11/12/2023)   Received from [facility]   Hunger Vital Sign    Worried About Running Out of Food in the Last Year: Never true    Ran Out of Food in the Last Year: Never true  Transportation Needs: No Transportation Needs (11/12/2023)   Received from [facility] - Transportation    Lack of Transportation (Medical): No    Lack of Transportation (Non-Medical): No  Recent Concern: Transportation Needs - Unmet Transportation Needs (09/30/2023)   PRAPARE - Transportation    Lack of Transportation (Medical): Yes    Lack of Transportation (Non-Medical): Yes  Physical Activity: Sufficiently Active (09/30/2023)   Exercise Vital Sign    Days of Exercise per Week: 5 days    Minutes of Exercise per Session: 30 min  Stress: No Stress Concern Present (09/30/2023)   [employer] of Occupational Health - Occupational Stress Questionnaire    Feeling of Stress : Not at all  Social Connections:  Moderately Isolated (09/30/2023)   Social Connection and Isolation Panel [NHANES]    Frequency of Communication with Friends and Family: More than three times a week    Frequency of Social Gatherings with Friends and Family: More than three times a week    Attends Religious Services: 1 to 4 times per year    Active Member of [employer] or Organizations: No    Attends Banker Meetings: Never    Marital Status: Separated  Intimate Partner Violence: Not At Risk (11/12/2023)   Received from [facility]    Humiliation, Afraid, Rape, and Kick questionnaire    Fear of Current or Ex-Partner: No    Emotionally Abused: No    Physically Abused: No    Sexually Abused: No   PHYSICAL EXAM  Vitals:   01/28/24 0902  BP: 130/70  Weight: 144 lb (65.3 kg)  Height: 6' (1.829 m)     Body mass index is 19.53 kg/m.  Generalized: Well developed, in no acute distress  Neurological examination  Mentation: Alert oriented to time, place, history taking. Follows all commands speech and language fluent is moderately dysarthric with infrequent pauses, some stuttering at time Cranial nerve II-XII: Pupils were equal round reactive to light. Extraocular movements were full, visual field were full on confrontational test. Facial sensation and strength were normal. Head turning and shoulder shrug were normal and symmetric. Motor: Good strength all extremities, exception decreased to the right upper extremity, wearing a sling post injury.  RUE is limited due to pain. RLE with poor effort  Sensory: Sensory testing is intact to soft touch on all 4 extremities. No evidence of extinction is noted.  Coordination: Cerebellar testing reveals good finger-nose-finger on the left Gait and station: flexing the right arm at the elbow, high circumduction with the right leg, sometimes walking on tip of toes. Reflexes: Normal 2+ DIAGNOSTIC DATA (LABS, IMAGING, TESTING) - I reviewed patient records, labs, notes, testing and imaging myself where available.  Lab Results  Component Value Date   WBC 6.7 09/09/2022   HGB 13.6 09/09/2022   HCT 39.1 09/09/2022   MCV 92.0 09/09/2022   PLT 199 09/09/2022      Component Value Date/Time   NA 136 09/09/2022 0422   NA 139 07/13/2020 0921   K 3.6 09/09/2022 0422   CL 102 09/09/2022 0422   CO2 26 09/09/2022 0422   GLUCOSE 88 09/09/2022 0422   BUN 5 (L) 09/09/2022 0422   BUN 11 07/13/2020 0921   CREATININE 0.69 09/09/2022 0422   CREATININE 0.99 03/13/2015 1004   CALCIUM 9.0  09/09/2022 0422   PROT 5.2 (L) 09/08/2022 0420   PROT 7.0 07/13/2020 0921   ALBUMIN 2.9 (L) 09/08/2022 0420   ALBUMIN 5.0 (H) 07/13/2020 0921   AST 14 (L) 09/08/2022 0420   ALT 11 09/08/2022 0420   ALKPHOS 84 09/08/2022 0420   BILITOT 0.5 09/08/2022 0420   BILITOT 0.5 07/13/2020 0921   GFRNONAA >60 09/09/2022 0422   GFRNONAA 85 03/13/2015 1004   GFRAA 91 07/13/2020 0921   GFRAA >89 03/13/2015 1004   Lab Results  Component Value Date   CHOL 143 09/09/2022   HDL 63 09/09/2022   LDLCALC 63 09/09/2022   TRIG 85 09/09/2022   CHOLHDL 2.3 09/09/2022   Lab Results  Component Value Date   HGBA1C 4.8 08/31/2021   No results found for: "VITAMINB12" Lab Results  Component Value Date   TSH 3.006 09/08/2022   ASSESSMENT AND PLAN 63 y.o.  year old male  has a past medical history of Abdominal pain (08/30/2019), Alcohol withdrawal (HCC) (07/29/2013), Allergy, Anxiety, Arthritis, Benzodiazepine abuse (HCC) (08/30/2019), Bipolar disorder (HCC), CAD (coronary artery disease), Carotid arterial disease (HCC), Chronic insomnia (06/06/2015), Chronic low back pain (12/28/2013), Chronic migraine without aura, with intractable migraine, so stated, with status migrainosus (08/25/2019), Claustrophobia, Cocaine abuse (HCC) (05/22/2015), COPD (chronic obstructive pulmonary disease) (HCC), Delirium tremens (HCC) (07/29/2013), Epilepsy (HCC), Exanthem due to herpes zoster (04/01/2021), Facial droop (09/07/2022), Fall at home, initial encounter (09/07/2022), Hemiplegic migraine with status migrainosus (06/06/2015), Hypertension (12/04/2013), Migraine, Multiple rib fractures (09/08/2022), Opioid use disorder (08/30/2019), Osteoarthritis of left wrist (01/04/2020), PAD (peripheral artery disease) (HCC), PONV (postoperative nausea and vomiting), Prolonged Q-T interval on ECG, Rib fractures (09/07/2022), Sinus bradycardia (09/07/2022), Stroke [facility]), Substance abuse (HCC), and Syncope (09/07/2022). here with:  1.   History of seizures 2.  History of alcohol abuse, in early remission 3.  Right upper and lower extremities weakness 4.  Mood Disorder  -Still complaints of daily petit-mal seizure -Will continue Keppra 1000 mg twice daily and Zonisamide 200 mg nightly.  -Will obtain MRI Brain to rule out stroke  -Will refer patient to home PT/OT/Speech -Follow up in year or sooner if worse   5.  Chronic intractable migraine headache -Unable to afford Botox due to cost  -Zonisamide 200 mg for headache prevention  -Maxalt and Zofran as needed for the headaches   -Previously tried a multitude of medications prior including Lamictal, Depakote, amitriptyline, Topamax, Aimovig, his insurance would not cover Ajovy, he tried [employer], Imitrex, Maxalt, Toradol, diclofenac potassium; some of his migraines have had hemiplegic symptoms    I have spent a total of 45 minutes dedicated to this patient today, preparing to see patient, performing a medically appropriate examination and evaluation, ordering tests and/or medications and procedures, and counseling and educating the patient/family/caregiver; independently interpreting result and communicating results to the family/patient/caregiver; and documenting clinical information in the electronic medical record.   Windell Norfolk, MD 01/28/2024, 9:11 AM [facility] [address], [address] [place], [place] [address] [phone]

## 2024-01-28 NOTE — Telephone Encounter (Signed)
 Will do!

## 2024-01-29 ENCOUNTER — Other Ambulatory Visit: Payer: Self-pay | Admitting: Family

## 2024-01-29 ENCOUNTER — Ambulatory Visit: Payer: 59 | Admitting: Emergency Medicine

## 2024-01-29 DIAGNOSIS — F410 Panic disorder [episodic paroxysmal anxiety] without agoraphobia: Secondary | ICD-10-CM

## 2024-01-29 NOTE — Telephone Encounter (Signed)
 Copied from CRM [id number]. Topic: Clinical - Prescription Issue >> Jan 29, 2024  2:37 PM Theodis Sato wrote: Reason for CRM: Patient called into request Spencer Mendoza to send in his clonazePAM (KLONOPIN) 0.5 MG tablet to his [facility] pharmacy as his sister faxed over the Non-Opiod Controlled substance agreement paperwork.

## 2024-01-30 MED ORDER — CLONAZEPAM 0.5 MG PO TABS: 0.5000 mg | ORAL_TABLET | Freq: Every day | ORAL | 2 refills | Status: DC | PRN

## 2024-01-30 MED ORDER — CLONAZEPAM 0.5 MG PO TABS
0.5000 mg | ORAL_TABLET | Freq: Every day | ORAL | Status: DC | PRN
Start: 2024-01-30 — End: 2024-01-30

## 2024-01-30 NOTE — Telephone Encounter (Signed)
 RX sent, but his sister has to pick up the RX as she is the only one allowed to handle the bottle.

## 2024-01-31 NOTE — H&P (View-Only) (Signed)
 Cardiology Office Note:  .   Date:  02/03/2024  ID:  Spencer Mendoza, DOB [birth year], MRN [id number] PCP: Dulce Sellar, NP  [facility] Providers Cardiologist:  Donato Schultz, MD Cardiology APP:  Marcelino Duster, PA    History of Present Illness: Spencer Mendoza Kitchen   Spencer Mendoza is a 63 y.o. male with a past medical history of nonobstructive CAD on coronary CTA 2021, vasovagal syncope, PAD, carotid artery disease, sinus bradycardia, PSVT, PAT, junctional tachycardia, alcohol abuse, prior cocaine abuse, epilepsy, migraines, COPD, possible pulmonary fibrosis.  Patient is followed by Dr. Anne Fu, presents today for follow-up after echocardiogram.  Patient was seen by Dr. Anne Fu in 2021 for evaluation of syncope.  Echocardiogram 03/22/2020 showed EF 50-55%, no regional wall motion abnormalities, normal RV function, mild MR.  Underwent coronary CTA on 03/29/2020 that showed a coronary calcium score of 330 (94th percentile), moderate plaque in the mid LAD and mild plaque in RCA.  Study was sent for [facility] which showed no evidence of flow-limiting CAD.  A cardiac monitor in 04/2020 showed sinus rhythm, rare junctional tachycardia, occasional SVT, PAT.  Overall, it was felt that his syncope was possibly vasovagal.  Overall, not felt to be cardiac in etiology.  Patient mated to the hospital in 08/2022 with multiple recurrent episodes of syncope.  Reported 10 episodes of syncope in the last 6 months.  Episodes primarily occurred when getting up to go to the bathroom at night.  Echocardiogram 09/08/2022 showed EF 60-65%, no wall motion abnormalities, mild LVH, normal RV function, normal PA systolic pressure.  Carotid ultrasounds 09/08/2022 showed 1-49% stenosis in bilateral ICAs.  A cardiac monitor in 09/2022 showed rare PACs, rare PVCs.  Patient was later admitted in 09/2022 with delirium due to alcohol intoxication.  He ultimately left AMA.  He was seen in follow-up on 10/15/2022.  At that time, patient  reported that he had not been taking midodrine due to not having refills.  He denied recurrent loss of consciousness, continue to have dizziness upon standing.  Patient admitted in 10/2023 with symptoms concerning for CVA.  However, symptoms had started 2 weeks prior.  MRI was ordered but patient was unable to tolerate MRI due to claustrophobia.  He was started on Keppra and Zonegran.  Per notes, differential was CVA versus hemiplegic migraine.  Overall, felt less likely CVA as CT head was negative.  Patient was seen by cardiology on 01/22/2024 in the clinic.  He reported having chest pain and dizziness when getting up to urinate during the night.  Chest pain occurred every day, lasts between 5 to 30 minutes.  Chest pain was worse with activity, relieved with rest.  Planned to obtain echocardiogram.  If echocardiogram was largely unremarkable, plan to repeat CT coronary.  If echocardiogram is abnormal, recommended proceeding with definitive angiography.  He underwent echocardiogram on 01/25/2024 that showed EF 55-60%, no regional wall motion abnormalities, normal RV function, mild MR.  The patient, with a history of substance abuse and cardiovascular disease, presents with chest pain and numbness in the legs. The chest pain is described as a feeling of tightness and pressure, which is exacerbated by physical activity. Notes that every time he tries to walk, he develops chest tightness, shortness of breath. Symptoms improve when he rests for a few minutes. Over time, his chest tightness and shortness of breath have been worsening and they have been especially bad for the past few weeks. These symptoms cause him a lot of distress. The patient also  reports experiencing panic attacks, which are associated with an increased heart rate and chest pain. Patient reports having some lightheadedness when he gets short of breath. He has inhalers to help with his shortness of breath, but he continues to get short of breath  when he exerts himself.   The patient's symptoms have been causing significant distress and anxiety. He has also had some intermittent R leg numbness which occurs after he has been sitting/resting for a bit. The patient reports feeling weaker and having less energy than usual, which has been impacting his quality of life. He admits that he has been smoking cigarettes again, but he denies recent alcohol or cocaine use.   ROS: Per HPI   Studies Reviewed: .   Cardiac Studies & Procedures   ______________________________________________________________________________________________     ECHOCARDIOGRAM  ECHOCARDIOGRAM COMPLETE 01/25/2024  Narrative ECHOCARDIOGRAM REPORT    Patient Name:   Spencer Mendoza Date of Exam: 01/25/2024 Medical Rec #:  [id number]         Height:       72.0 in Accession #:    [id number]        Weight:       145.8 lb Date of Birth:  11-16-1961         BSA:          1.863 m Patient Age:    62 years          BP:           124/72 mmHg Patient Gender: M                 HR:           60 bpm. Exam Location:  [place]  Procedure: 2D Echo, 3D Echo, Cardiac Doppler, Color Doppler and Strain Analysis (Both Spectral and Color Flow Doppler were utilized during procedure).  STAT ECHO  Indications:    R07.9* Chest pain, unspecified  History:        Patient has prior history of Echocardiogram examinations, most recent 09/08/2022. CAD, Abnormal ECG, Stroke, COPD, PAD and Carotid Disease, Arrythmias:Bradycardia, Tachycardia and RBBB, Signs/Symptoms:Syncope, Chest Pain and Shortness of Breath; Risk Factors:Current Smoker.  Sonographer:    Ilda Mori MHA, BS, RDCS Referring Phys: [id number] ANGELA NICOLE DUKE  IMPRESSIONS   1. Left ventricular ejection fraction, by estimation, is 55 to 60%. Left ventricular ejection fraction by 3D volume is 66 %. The left ventricle has normal function. The left ventricle has no regional wall motion abnormalities. Left ventricular  diastolic parameters were normal. The average left ventricular global longitudinal strain is -18.2 %. The global longitudinal strain is normal. 2. Right ventricular systolic function is normal. The right ventricular size is normal. Tricuspid regurgitation signal is inadequate for assessing PA pressure. 3. The mitral valve is normal in structure. Mild mitral valve regurgitation. No evidence of mitral stenosis. 4. The aortic valve is tricuspid. Aortic valve regurgitation is mild. No aortic stenosis is present. 5. The inferior vena cava is normal in size with greater than 50% respiratory variability, suggesting right atrial pressure of 3 mmHg.  FINDINGS Left Ventricle: Left ventricular ejection fraction, by estimation, is 55 to 60%. Left ventricular ejection fraction by 3D volume is 66 %. The left ventricle has normal function. The left ventricle has no regional wall motion abnormalities. The average left ventricular global longitudinal strain is -18.2 %. Strain was performed and the global longitudinal strain is normal. The left ventricular internal cavity size was normal in size. There is  no left ventricular hypertrophy. Left ventricular diastolic parameters were normal.  Right Ventricle: The right ventricular size is normal. No increase in right ventricular wall thickness. Right ventricular systolic function is normal. Tricuspid regurgitation signal is inadequate for assessing PA pressure.  Left Atrium: Left atrial size was normal in size.  Right Atrium: Right atrial size was normal in size.  Pericardium: There is no evidence of pericardial effusion.  Mitral Valve: The mitral valve is normal in structure. Mild mitral valve regurgitation. No evidence of mitral valve stenosis.  Tricuspid Valve: The tricuspid valve is normal in structure. Tricuspid valve regurgitation is mild . No evidence of tricuspid stenosis.  Aortic Valve: The aortic valve is tricuspid. Aortic valve regurgitation is mild. No  aortic stenosis is present. Aortic valve mean gradient measures 3.0 mmHg. Aortic valve peak gradient measures 6.1 mmHg.  Pulmonic Valve: The pulmonic valve was normal in structure. Pulmonic valve regurgitation is not visualized. No evidence of pulmonic stenosis.  Aorta: The aortic root is normal in size and structure.  Venous: The inferior vena cava is normal in size with greater than 50% respiratory variability, suggesting right atrial pressure of 3 mmHg.  IAS/Shunts: No atrial level shunt detected by color flow Doppler.  Additional Comments: 3D was performed not requiring image post processing on an independent workstation and was normal.   LEFT VENTRICLE PLAX 2D LVIDd:         4.70 cm         Diastology LVIDs:         3.30 cm         LV e' medial:    8.92 cm/s LV PW:         0.80 cm         LV E/e' medial:  8.5 LV IVS:        0.80 cm         LV e' lateral:   12.30 cm/s LV E/e' lateral: 6.2  2D Longitudinal Strain 2D Strain GLS   -18.2 % Avg:  3D Volume EF LV 3D EF:    Left ventricul ar ejection fraction by 3D volume is 66 %.  3D Volume EF: 3D EF:        66 % LV EDV:       147 ml LV ESV:       51 ml LV SV:        97 ml  RIGHT VENTRICLE             IVC RV Basal diam:  2.50 cm     IVC diam: 2.80 cm RV Mid diam:    2.40 cm RV S prime:     13.80 cm/s TAPSE (M-mode): 1.7 cm  LEFT ATRIUM             Index        RIGHT ATRIUM           Index LA diam:        3.20 cm 1.72 cm/m   RA Area:     13.70 cm LA Vol (A2C):   69.5 ml 37.31 ml/m  RA Volume:   29.40 ml  15.78 ml/m LA Vol (A4C):   36.4 ml 19.54 ml/m LA Biplane Vol: 56.0 ml 30.06 ml/m AORTIC VALVE AV Vmax:           123.00 cm/s AV Vmean:          79.900 cm/s AV VTI:  0.268 m AV Peak Grad:      6.1 mmHg AV Mean Grad:      3.0 mmHg LVOT Vmax:         103.00 cm/s LVOT Vmean:        66.600 cm/s LVOT VTI:          0.226 m LVOT/AV VTI ratio: 0.84  AORTA Ao Root diam: 3.50 cm Ao Asc diam:  3.50  cm  MITRAL VALVE MV Area (PHT): 3.31 cm    SHUNTS MV Decel Time: 229 msec    Systemic VTI: 0.23 m MV E velocity: 76.00 cm/s MV A velocity: 65.50 cm/s MV E/A ratio:  1.16  Julien Nordmann MD Electronically signed by Julien Nordmann MD Signature Date/Time: 01/25/2024/12:43:56 PM    Final    MONITORS  LONG TERM MONITOR-LIVE TELEMETRY (3-14 DAYS) 10/02/2022  Narrative   14 day monitor   Rare supraventricular ectopy in the form of isolated PACs, couplets   Rare ventricular ectopy in the form of isolated PVCs, couplets   No symptoms reported   Patch Wear Time:  13 days and 23 hours (2023-10-24T08:42:39-0400 to 2023-11-07T07:34:58-0500)  Patient had a min HR of 43 bpm, max HR of 136 bpm, and avg HR of 68 bpm. Predominant underlying rhythm was Sinus Rhythm. First Degree AV Block was present. Isolated SVEs were rare (<1.0%), SVE Couplets were rare (<1.0%), and no SVE Triplets were present. Isolated VEs were rare (<1.0%), VE Couplets were rare (<1.0%), and no VE Triplets were present. Ventricular Bigeminy was present.   CT SCANS  CT CORONARY FRACTIONAL FLOW RESERVE DATA PREP 03/29/2020  Narrative EXAM: FFRCT ANALYSIS  FINDINGS: FFRct analysis was performed on the original cardiac CT angiogram dataset. Diagrammatic representation of the FFRct analysis is provided in a separate PDF document in PACS. This dictation was created using the PDF document and an interactive 3D model of the results. 3D model is not available in the EMR/PACS. Normal FFR range is >0.80.  1. Normal FFR through coronary arteries (lowest FFR 0.90 in distal segments).  IMPRESSION: Normal FFR. No evidence of flow limiting CAD. Continue with medical management.  Donato Schultz, MD [facility]  Note: These examples are not recommendations of [employer] and only provided as examples of what other customers are doing.   Electronically Signed By: Donato Schultz MD On: 03/30/2020 06:22   CT CORONARY MORPH W/CTA  COR W/SCORE 03/29/2020  Addendum 03/29/2020  7:21 PM ADDENDUM REPORT: 03/29/2020 19:19  CLINICAL DATA:  31M with chest pain  EXAM: Cardiac/Coronary  CT  TECHNIQUE: The patient was scanned on a [employer].  FINDINGS: A 120 kV prospective scan was triggered in the descending thoracic aorta at 111 HU's. Axial non-contrast 3 mm slices were carried out through the heart. The data set was analyzed on a dedicated work station and scored using the Agatson method. Gantry rotation speed was 250 msecs and collimation was .6 mm. No beta blockade and 0.8 mg of sl NTG was given. The 3D data set was reconstructed in 5% intervals of the 67-82 % of the R-R cycle. Diastolic phases were analyzed on a dedicated work station using MPR, MIP and VRT modes. The patient received 80 cc of contrast.  Aorta: Normal size. Ascending aorta 3.5 cm. No calcifications. No dissection.  Aortic Valve:  Trileaflet.  No calcifications.  Coronary Arteries:  Normal coronary origin.  Right dominance.  RCA is a large dominant artery that gives rise to PDA and PLVB. There is no mild (25-49%)  soft attenuation plaque proximally, minimal (<25%) mixed plaque in the mid and distal RCA.  Left main is a large artery that gives rise to LAD and LCX arteries.  LAD is a large vessel that has minimal (<25%) calcified plaque proximally. There is moderate (50-69%) mixed plaque in the mid LAD. There is a small, branching D1 and normal D2 without disease.  LCX is a non-dominant artery that gives rise to one large OM1 branch. There is minimum calcified plaque in the proximal LCX.  Other findings:  Normal pulmonary vein drainage into the left atrium.  Normal let atrial appendage without a thrombus.  Normal size of the pulmonary artery.  IMPRESSION: 1.  Poor quality study due to motion.  2. Coronary calcium score of 330. This was 94th percentile for age and sex matched control.  2.  Normal coronary origin with  right dominance.  3. Moderate plaque in the mid LAD with mild plaque in the RCA. Given the motion artifact this study is not a good candidate for FFRct.  4. Recommend aggressive risk factor modification including high potency statin. If symptoms persist, would have a low threshold to consider cardiac catheterization.  Chilton Si, MD   Electronically Signed By: Chilton Si On: 03/29/2020 19:19  Addendum 03/29/2020  4:46 PM ADDENDUM REPORT: 03/29/2020 16:44  CLINICAL DATA:  63 year old with chest pain, tobacco use.  EXAM: Cardiac/Coronary  CTA  TECHNIQUE: The patient was scanned on a [employer].  FINDINGS: A 90 kV prospective scan was triggered in the descending thoracic aorta at 111 HU's. Axial non-contrast 3 mm slices were carried out through the heart. The data set was analyzed on a dedicated work station and scored using the Agatson method. Gantry rotation speed was 250 msecs and collimation was .6 mm. Beta blockade and 0.8 mg of sl NTG was given. The 3D data set was reconstructed in 5% intervals of the 67-82 % of the R-R cycle. Diastolic phases were analyzed on a dedicated work station using MPR, MIP and VRT modes. The patient received 80 cc of contrast.  Aorta:  Normal size.  No calcifications.  No dissection.  Aortic Valve:  Trileaflet.  No calcifications.  Coronary Arteries:  Normal coronary origin.  Right dominance.  RCA is a large dominant artery that gives rise to PDA and PLA. There is both proximal and mid calcified (mid mixed) plaque that is non obstructive (0-24%).  Left main is a large artery that gives rise to LAD and LCX arteries. There is a small focal calcified plaque, non flow limiting (0-24%).  LAD is a large vessel that has extensive prox to mid mixed calcified plaque that appears at points to be 50-69%, moderate in severity. Will send for CT FFR flow analysis.  LCX is a non-dominant artery that gives rise to one large  OM1 branch. There is focal non flow limiting calcified plaque (0-24%). There is at the proximal takeoff of first large OM soft plaque that appears moderate (50-69%).  Other findings:  Normal pulmonary vein drainage into the left atrium.  Normal left atrial appendage without a thrombus.  Normal size of the pulmonary artery.  IMPRESSION: 1. Coronary calcium score of 329. This was 52 percentile for age and sex matched control.  2. Normal coronary origin with right dominance.  3. Three vessel coronary mixed calcified non calcified plaque with moderate severity in proximal first OM branch and mid LAD. Sending for FFR analysis.  Donato Schultz, MD [facility]   Electronically Signed  By: Donato Schultz MD On: 03/29/2020 16:44  Narrative EXAM: OVER-READ INTERPRETATION  CT CHEST  The following report is an over-read performed by radiologist Dr. Charlett Nose of [facility] on 03/29/2020. This over-read does not include interpretation of cardiac or coronary anatomy or pathology. The coronary CTA interpretation by the cardiologist is attached.  COMPARISON:  None.  FINDINGS: Vascular: Heart is normal size.  Aorta normal caliber.  Mediastinum/Nodes: No adenopathy in the lower mediastinum or hila.  Lungs/Pleura: Dependent ground-glass opacities in the lungs, right greater than left could reflect atelectasis or scarring. There is peripheral interstitial thickening, likely early fibrosis. No confluent opacities or effusions.  Upper Abdomen: Imaging into the upper abdomen shows no acute findings.  Musculoskeletal: Chest wall soft tissues are unremarkable. No acute bony abnormality.  IMPRESSION: No acute extra cardiac abnormality.  Peripheral interstitial thickening which likely reflects early fibrosis.  Electronically Signed: By: Charlett Nose M.D. On: 03/29/2020 15:42      ______________________________________________________________________________________________      Risk Assessment/Calculations:             Physical Exam:   VS:  BP 116/70   Pulse (!) 58   Ht 6' (1.829 m)   Wt 147 lb 6.4 oz (66.9 kg)   SpO2 96%   BMI 19.99 kg/m    Wt Readings from Last 3 Encounters:  02/03/24 147 lb 6.4 oz (66.9 kg)  01/28/24 144 lb (65.3 kg)  01/22/24 145 lb 12.8 oz (66.1 kg)    GEN: Well nourished, well developed in no acute distress. Sitting comfortably on the exam table  NECK: No JVD; No carotid bruits CARDIAC:  RRR, no murmurs, rubs, gallops. Radial pulses 2+ bilaterally  RESPIRATORY:  Clear to auscultation without rales, wheezing or rhonchi. Normal WOB on room air  ABDOMEN: Soft, non-tender, non-distended EXTREMITIES:  No edema in BLE. No deformity   ASSESSMENT AND PLAN: .    Chest Pain  Nonobstructive CAD  - Previously underwent coronary CTA in 03/2020 that showed a coronary calcium score of 330 (94th percentile), moderate plaque in the mid LAD and mild plaque in RCA. Study was poor due to motion  - Patient seen 3/7 and reported having episodes of chest pain that was worse with exertion, relieved with rest. Intensity had increased over the past 3 months. Coronary CT vs cath were discussed at that appointment, but patient was hesitant  - Underwent echocardiogram 01/25/24 that showed EF 55-60%, no wall motion abnormalities, normal RV function  - EKG today shows sinus bradycardia with HR 55 BPM, incomplete RBBB  - Patient continues to have exertional chest pain, shortness of breath. Symptoms improve with rest. His symptoms have continued to progress and are causing him quite a bit of stress. Also feels like he has low energy tolerance  - His previous coronary CTA in 2021 showed a coronary calcium score of 330 (94th percentile). Study was poor due to motion  - With his progressive symptoms, high coronary calcium score, risk factors (HLD, tobacco use,  history of CVA, history of PAD), recommended cardiac catheterization. Discussed with Dr. Anne Fu who is in agreement. Patient is also in agreement  - Scheduled for LHC next week. Ordered BMP, CBC prior to cath  - Continue ASA 81 mg daily, crestor 20 mg daily  - Not on BB, amlodipine, imdur due to suspected orthostatic hypotension/syncope   Recurrent Syncope  - In the past, thought to be possibly vasovagal or orthostatic. 2 cardiac monitors (2021, 2023) were without significant  arrhythmia  - Patient denies recent syncope. Continues to have occasional dizziness, but it is overall better than it was in the past   History of polysubstance abuse - Patient has history of alcohol and cocaine use - Has not used cocaine in >2 years. Reports that he has not had any alcohol in "a long time" - He does admit to cigarette use. We discussed importance of tobacco cessation   History of CVA vs hemiplegic migraine  - 10/2023- seen by neurology, felt to be more likely hemiplegic migraine as head CT was negative. Patient did not tolerate MRI to confirm  - Followed by neurology   Carotid Artery Disease  - Carotid ultrasounds from 08/2022 showed 1-49% stenosis in bilateral ICAs  - Continue ASA, crestor  - Ordered ultrasounds for monitoring   HLD  - Lipid panel from 08/2022 showed LDL 63 - Continue crestor 20 mg daily      Informed Consent   Shared Decision Making/Informed Consent{  The risks [stroke (1 in 1000), death (1 in 1000), kidney failure [usually temporary] (1 in 500), bleeding (1 in 200), allergic reaction [possibly serious] (1 in 200)], benefits (diagnostic support and management of coronary artery disease) and alternatives of a cardiac catheterization were discussed in detail with Spencer Mendoza and he is willing to proceed.     Dispo: Follow up 2-3 weeks after cath   Signed, Jonita Albee, PA-C

## 2024-01-31 NOTE — Progress Notes (Unsigned)
 Cardiology Office Note:  .   Date:  02/03/2024  ID:  Toma Copier, DOB [birth year], MRN [id number] PCP: Dulce Sellar, NP  [facility] Providers Cardiologist:  Donato Schultz, MD Cardiology APP:  Marcelino Duster, PA    History of Present Illness: Spencer Mendoza   Spencer Mendoza is a 63 y.o. male with a past medical history of nonobstructive CAD on coronary CTA 2021, vasovagal syncope, PAD, carotid artery disease, sinus bradycardia, PSVT, PAT, junctional tachycardia, alcohol abuse, prior cocaine abuse, epilepsy, migraines, COPD, possible pulmonary fibrosis.  Patient is followed by Dr. Anne Fu, presents today for follow-up after echocardiogram.  Patient was seen by Dr. Anne Fu in 2021 for evaluation of syncope.  Echocardiogram 03/22/2020 showed EF 50-55%, no regional wall motion abnormalities, normal RV function, mild MR.  Underwent coronary CTA on 03/29/2020 that showed a coronary calcium score of 330 (94th percentile), moderate plaque in the mid LAD and mild plaque in RCA.  Study was sent for [facility] which showed no evidence of flow-limiting CAD.  A cardiac monitor in 04/2020 showed sinus rhythm, rare junctional tachycardia, occasional SVT, PAT.  Overall, it was felt that his syncope was possibly vasovagal.  Overall, not felt to be cardiac in etiology.  Patient mated to the hospital in 08/2022 with multiple recurrent episodes of syncope.  Reported 10 episodes of syncope in the last 6 months.  Episodes primarily occurred when getting up to go to the bathroom at night.  Echocardiogram 09/08/2022 showed EF 60-65%, no wall motion abnormalities, mild LVH, normal RV function, normal PA systolic pressure.  Carotid ultrasounds 09/08/2022 showed 1-49% stenosis in bilateral ICAs.  A cardiac monitor in 09/2022 showed rare PACs, rare PVCs.  Patient was later admitted in 09/2022 with delirium due to alcohol intoxication.  He ultimately left AMA.  He was seen in follow-up on 10/15/2022.  At that time, patient  reported that he had not been taking midodrine due to not having refills.  He denied recurrent loss of consciousness, continue to have dizziness upon standing.  Patient admitted in 10/2023 with symptoms concerning for CVA.  However, symptoms had started 2 weeks prior.  MRI was ordered but patient was unable to tolerate MRI due to claustrophobia.  He was started on Keppra and Zonegran.  Per notes, differential was CVA versus hemiplegic migraine.  Overall, felt less likely CVA as CT head was negative.  Patient was seen by cardiology on 01/22/2024 in the clinic.  He reported having chest pain and dizziness when getting up to urinate during the night.  Chest pain occurred every day, lasts between 5 to 30 minutes.  Chest pain was worse with activity, relieved with rest.  Planned to obtain echocardiogram.  If echocardiogram was largely unremarkable, plan to repeat CT coronary.  If echocardiogram is abnormal, recommended proceeding with definitive angiography.  He underwent echocardiogram on 01/25/2024 that showed EF 55-60%, no regional wall motion abnormalities, normal RV function, mild MR.  The patient, with a history of substance abuse and cardiovascular disease, presents with chest pain and numbness in the legs. The chest pain is described as a feeling of tightness and pressure, which is exacerbated by physical activity. Notes that every time he tries to walk, he develops chest tightness, shortness of breath. Symptoms improve when he rests for a few minutes. Over time, his chest tightness and shortness of breath have been worsening and they have been especially bad for the past few weeks. These symptoms cause him a lot of distress. The patient also  reports experiencing panic attacks, which are associated with an increased heart rate and chest pain. Patient reports having some lightheadedness when he gets short of breath. He has inhalers to help with his shortness of breath, but he continues to get short of breath  when he exerts himself.   The patient's symptoms have been causing significant distress and anxiety. He has also had some intermittent R leg numbness which occurs after he has been sitting/resting for a bit. The patient reports feeling weaker and having less energy than usual, which has been impacting his quality of life. He admits that he has been smoking cigarettes again, but he denies recent alcohol or cocaine use.   ROS: Per HPI   Studies Reviewed: .   Cardiac Studies & Procedures   ______________________________________________________________________________________________     ECHOCARDIOGRAM  ECHOCARDIOGRAM COMPLETE 01/25/2024  Narrative ECHOCARDIOGRAM REPORT    Patient Name:   Spencer Mendoza Date of Exam: 01/25/2024 Medical Rec #:  [id number]         Height:       72.0 in Accession #:    [id number]        Weight:       145.8 lb Date of Birth:  11-16-1961         BSA:          1.863 m Patient Age:    63 years          BP:           124/72 mmHg Patient Gender: M                 HR:           60 bpm. Exam Location:  [place]  Procedure: 2D Echo, 3D Echo, Cardiac Doppler, Color Doppler and Strain Analysis (Both Spectral and Color Flow Doppler were utilized during procedure).  STAT ECHO  Indications:    R07.9* Chest pain, unspecified  History:        Patient has prior history of Echocardiogram examinations, most recent 09/08/2022. CAD, Abnormal ECG, Stroke, COPD, PAD and Carotid Disease, Arrythmias:Bradycardia, Tachycardia and RBBB, Signs/Symptoms:Syncope, Chest Pain and Shortness of Breath; Risk Factors:Current Smoker.  Sonographer:    Ilda Mori MHA, BS, RDCS Referring Phys: [id number] ANGELA NICOLE DUKE  IMPRESSIONS   1. Left ventricular ejection fraction, by estimation, is 55 to 60%. Left ventricular ejection fraction by 3D volume is 66 %. The left ventricle has normal function. The left ventricle has no regional wall motion abnormalities. Left ventricular  diastolic parameters were normal. The average left ventricular global longitudinal strain is -18.2 %. The global longitudinal strain is normal. 2. Right ventricular systolic function is normal. The right ventricular size is normal. Tricuspid regurgitation signal is inadequate for assessing PA pressure. 3. The mitral valve is normal in structure. Mild mitral valve regurgitation. No evidence of mitral stenosis. 4. The aortic valve is tricuspid. Aortic valve regurgitation is mild. No aortic stenosis is present. 5. The inferior vena cava is normal in size with greater than 50% respiratory variability, suggesting right atrial pressure of 3 mmHg.  FINDINGS Left Ventricle: Left ventricular ejection fraction, by estimation, is 55 to 60%. Left ventricular ejection fraction by 3D volume is 66 %. The left ventricle has normal function. The left ventricle has no regional wall motion abnormalities. The average left ventricular global longitudinal strain is -18.2 %. Strain was performed and the global longitudinal strain is normal. The left ventricular internal cavity size was normal in size. There is  no left ventricular hypertrophy. Left ventricular diastolic parameters were normal.  Right Ventricle: The right ventricular size is normal. No increase in right ventricular wall thickness. Right ventricular systolic function is normal. Tricuspid regurgitation signal is inadequate for assessing PA pressure.  Left Atrium: Left atrial size was normal in size.  Right Atrium: Right atrial size was normal in size.  Pericardium: There is no evidence of pericardial effusion.  Mitral Valve: The mitral valve is normal in structure. Mild mitral valve regurgitation. No evidence of mitral valve stenosis.  Tricuspid Valve: The tricuspid valve is normal in structure. Tricuspid valve regurgitation is mild . No evidence of tricuspid stenosis.  Aortic Valve: The aortic valve is tricuspid. Aortic valve regurgitation is mild. No  aortic stenosis is present. Aortic valve mean gradient measures 3.0 mmHg. Aortic valve peak gradient measures 6.1 mmHg.  Pulmonic Valve: The pulmonic valve was normal in structure. Pulmonic valve regurgitation is not visualized. No evidence of pulmonic stenosis.  Aorta: The aortic root is normal in size and structure.  Venous: The inferior vena cava is normal in size with greater than 50% respiratory variability, suggesting right atrial pressure of 3 mmHg.  IAS/Shunts: No atrial level shunt detected by color flow Doppler.  Additional Comments: 3D was performed not requiring image post processing on an independent workstation and was normal.   LEFT VENTRICLE PLAX 2D LVIDd:         4.70 cm         Diastology LVIDs:         3.30 cm         LV e' medial:    8.92 cm/s LV PW:         0.80 cm         LV E/e' medial:  8.5 LV IVS:        0.80 cm         LV e' lateral:   12.30 cm/s LV E/e' lateral: 6.2  2D Longitudinal Strain 2D Strain GLS   -18.2 % Avg:  3D Volume EF LV 3D EF:    Left ventricul ar ejection fraction by 3D volume is 66 %.  3D Volume EF: 3D EF:        66 % LV EDV:       147 ml LV ESV:       51 ml LV SV:        97 ml  RIGHT VENTRICLE             IVC RV Basal diam:  2.50 cm     IVC diam: 2.80 cm RV Mid diam:    2.40 cm RV S prime:     13.80 cm/s TAPSE (M-mode): 1.7 cm  LEFT ATRIUM             Index        RIGHT ATRIUM           Index LA diam:        3.20 cm 1.72 cm/m   RA Area:     13.70 cm LA Vol (A2C):   69.5 ml 37.31 ml/m  RA Volume:   29.40 ml  15.78 ml/m LA Vol (A4C):   36.4 ml 19.54 ml/m LA Biplane Vol: 56.0 ml 30.06 ml/m AORTIC VALVE AV Vmax:           123.00 cm/s AV Vmean:          79.900 cm/s AV VTI:  0.268 m AV Peak Grad:      6.1 mmHg AV Mean Grad:      3.0 mmHg LVOT Vmax:         103.00 cm/s LVOT Vmean:        66.600 cm/s LVOT VTI:          0.226 m LVOT/AV VTI ratio: 0.84  AORTA Ao Root diam: 3.50 cm Ao Asc diam:  3.50  cm  MITRAL VALVE MV Area (PHT): 3.31 cm    SHUNTS MV Decel Time: 229 msec    Systemic VTI: 0.23 m MV E velocity: 76.00 cm/s MV A velocity: 65.50 cm/s MV E/A ratio:  1.16  Julien Nordmann MD Electronically signed by Julien Nordmann MD Signature Date/Time: 01/25/2024/12:43:56 PM    Final    MONITORS  LONG TERM MONITOR-LIVE TELEMETRY (3-14 DAYS) 10/02/2022  Narrative   14 day monitor   Rare supraventricular ectopy in the form of isolated PACs, couplets   Rare ventricular ectopy in the form of isolated PVCs, couplets   No symptoms reported   Patch Wear Time:  13 days and 23 hours (2023-10-24T08:42:39-0400 to 2023-11-07T07:34:58-0500)  Patient had a min HR of 43 bpm, max HR of 136 bpm, and avg HR of 68 bpm. Predominant underlying rhythm was Sinus Rhythm. First Degree AV Block was present. Isolated SVEs were rare (<1.0%), SVE Couplets were rare (<1.0%), and no SVE Triplets were present. Isolated VEs were rare (<1.0%), VE Couplets were rare (<1.0%), and no VE Triplets were present. Ventricular Bigeminy was present.   CT SCANS  CT CORONARY FRACTIONAL FLOW RESERVE DATA PREP 03/29/2020  Narrative EXAM: FFRCT ANALYSIS  FINDINGS: FFRct analysis was performed on the original cardiac CT angiogram dataset. Diagrammatic representation of the FFRct analysis is provided in a separate PDF document in PACS. This dictation was created using the PDF document and an interactive 3D model of the results. 3D model is not available in the EMR/PACS. Normal FFR range is >0.80.  1. Normal FFR through coronary arteries (lowest FFR 0.90 in distal segments).  IMPRESSION: Normal FFR. No evidence of flow limiting CAD. Continue with medical management.  Donato Schultz, MD [facility]  Note: These examples are not recommendations of [employer] and only provided as examples of what other customers are doing.   Electronically Signed By: Donato Schultz MD On: 03/30/2020 06:22   CT CORONARY MORPH W/CTA  COR W/SCORE 03/29/2020  Addendum 03/29/2020  7:21 PM ADDENDUM REPORT: 03/29/2020 19:19  CLINICAL DATA:  31M with chest pain  EXAM: Cardiac/Coronary  CT  TECHNIQUE: The patient was scanned on a [employer].  FINDINGS: A 120 kV prospective scan was triggered in the descending thoracic aorta at 111 HU's. Axial non-contrast 3 mm slices were carried out through the heart. The data set was analyzed on a dedicated work station and scored using the Agatson method. Gantry rotation speed was 250 msecs and collimation was .6 mm. No beta blockade and 0.8 mg of sl NTG was given. The 3D data set was reconstructed in 5% intervals of the 67-82 % of the R-R cycle. Diastolic phases were analyzed on a dedicated work station using MPR, MIP and VRT modes. The patient received 80 cc of contrast.  Aorta: Normal size. Ascending aorta 3.5 cm. No calcifications. No dissection.  Aortic Valve:  Trileaflet.  No calcifications.  Coronary Arteries:  Normal coronary origin.  Right dominance.  RCA is a large dominant artery that gives rise to PDA and PLVB. There is no mild (25-49%)  soft attenuation plaque proximally, minimal (<25%) mixed plaque in the mid and distal RCA.  Left main is a large artery that gives rise to LAD and LCX arteries.  LAD is a large vessel that has minimal (<25%) calcified plaque proximally. There is moderate (50-69%) mixed plaque in the mid LAD. There is a small, branching D1 and normal D2 without disease.  LCX is a non-dominant artery that gives rise to one large OM1 branch. There is minimum calcified plaque in the proximal LCX.  Other findings:  Normal pulmonary vein drainage into the left atrium.  Normal let atrial appendage without a thrombus.  Normal size of the pulmonary artery.  IMPRESSION: 1.  Poor quality study due to motion.  2. Coronary calcium score of 330. This was 94th percentile for age and sex matched control.  2.  Normal coronary origin with  right dominance.  3. Moderate plaque in the mid LAD with mild plaque in the RCA. Given the motion artifact this study is not a good candidate for FFRct.  4. Recommend aggressive risk factor modification including high potency statin. If symptoms persist, would have a low threshold to consider cardiac catheterization.  Chilton Si, MD   Electronically Signed By: Chilton Si On: 03/29/2020 19:19  Addendum 03/29/2020  4:46 PM ADDENDUM REPORT: 03/29/2020 16:44  CLINICAL DATA:  63 year old with chest pain, tobacco use.  EXAM: Cardiac/Coronary  CTA  TECHNIQUE: The patient was scanned on a [employer].  FINDINGS: A 90 kV prospective scan was triggered in the descending thoracic aorta at 111 HU's. Axial non-contrast 3 mm slices were carried out through the heart. The data set was analyzed on a dedicated work station and scored using the Agatson method. Gantry rotation speed was 250 msecs and collimation was .6 mm. Beta blockade and 0.8 mg of sl NTG was given. The 3D data set was reconstructed in 5% intervals of the 67-82 % of the R-R cycle. Diastolic phases were analyzed on a dedicated work station using MPR, MIP and VRT modes. The patient received 80 cc of contrast.  Aorta:  Normal size.  No calcifications.  No dissection.  Aortic Valve:  Trileaflet.  No calcifications.  Coronary Arteries:  Normal coronary origin.  Right dominance.  RCA is a large dominant artery that gives rise to PDA and PLA. There is both proximal and mid calcified (mid mixed) plaque that is non obstructive (0-24%).  Left main is a large artery that gives rise to LAD and LCX arteries. There is a small focal calcified plaque, non flow limiting (0-24%).  LAD is a large vessel that has extensive prox to mid mixed calcified plaque that appears at points to be 50-69%, moderate in severity. Will send for CT FFR flow analysis.  LCX is a non-dominant artery that gives rise to one large  OM1 branch. There is focal non flow limiting calcified plaque (0-24%). There is at the proximal takeoff of first large OM soft plaque that appears moderate (50-69%).  Other findings:  Normal pulmonary vein drainage into the left atrium.  Normal left atrial appendage without a thrombus.  Normal size of the pulmonary artery.  IMPRESSION: 1. Coronary calcium score of 329. This was 52 percentile for age and sex matched control.  2. Normal coronary origin with right dominance.  3. Three vessel coronary mixed calcified non calcified plaque with moderate severity in proximal first OM branch and mid LAD. Sending for FFR analysis.  Donato Schultz, MD [facility]   Electronically Signed  By: Donato Schultz MD On: 03/29/2020 16:44  Narrative EXAM: OVER-READ INTERPRETATION  CT CHEST  The following report is an over-read performed by radiologist Dr. Charlett Nose of [facility] on 03/29/2020. This over-read does not include interpretation of cardiac or coronary anatomy or pathology. The coronary CTA interpretation by the cardiologist is attached.  COMPARISON:  None.  FINDINGS: Vascular: Heart is normal size.  Aorta normal caliber.  Mediastinum/Nodes: No adenopathy in the lower mediastinum or hila.  Lungs/Pleura: Dependent ground-glass opacities in the lungs, right greater than left could reflect atelectasis or scarring. There is peripheral interstitial thickening, likely early fibrosis. No confluent opacities or effusions.  Upper Abdomen: Imaging into the upper abdomen shows no acute findings.  Musculoskeletal: Chest wall soft tissues are unremarkable. No acute bony abnormality.  IMPRESSION: No acute extra cardiac abnormality.  Peripheral interstitial thickening which likely reflects early fibrosis.  Electronically Signed: By: Charlett Nose M.D. On: 03/29/2020 15:42      ______________________________________________________________________________________________      Risk Assessment/Calculations:             Physical Exam:   VS:  BP 116/70   Pulse (!) 58   Ht 6' (1.829 m)   Wt 147 lb 6.4 oz (66.9 kg)   SpO2 96%   BMI 19.99 kg/m    Wt Readings from Last 3 Encounters:  02/03/24 147 lb 6.4 oz (66.9 kg)  01/28/24 144 lb (65.3 kg)  01/22/24 145 lb 12.8 oz (66.1 kg)    GEN: Well nourished, well developed in no acute distress. Sitting comfortably on the exam table  NECK: No JVD; No carotid bruits CARDIAC:  RRR, no murmurs, rubs, gallops. Radial pulses 2+ bilaterally  RESPIRATORY:  Clear to auscultation without rales, wheezing or rhonchi. Normal WOB on room air  ABDOMEN: Soft, non-tender, non-distended EXTREMITIES:  No edema in BLE. No deformity   ASSESSMENT AND PLAN: .    Chest Pain  Nonobstructive CAD  - Previously underwent coronary CTA in 03/2020 that showed a coronary calcium score of 330 (94th percentile), moderate plaque in the mid LAD and mild plaque in RCA. Study was poor due to motion  - Patient seen 3/7 and reported having episodes of chest pain that was worse with exertion, relieved with rest. Intensity had increased over the past 3 months. Coronary CT vs cath were discussed at that appointment, but patient was hesitant  - Underwent echocardiogram 01/25/24 that showed EF 55-60%, no wall motion abnormalities, normal RV function  - EKG today shows sinus bradycardia with HR 55 BPM, incomplete RBBB  - Patient continues to have exertional chest pain, shortness of breath. Symptoms improve with rest. His symptoms have continued to progress and are causing him quite a bit of stress. Also feels like he has low energy tolerance  - His previous coronary CTA in 2021 showed a coronary calcium score of 330 (94th percentile). Study was poor due to motion  - With his progressive symptoms, high coronary calcium score, risk factors (HLD, tobacco use,  history of CVA, history of PAD), recommended cardiac catheterization. Discussed with Dr. Anne Fu who is in agreement. Patient is also in agreement  - Scheduled for LHC next week. Ordered BMP, CBC prior to cath  - Continue ASA 81 mg daily, crestor 20 mg daily  - Not on BB, amlodipine, imdur due to suspected orthostatic hypotension/syncope   Recurrent Syncope  - In the past, thought to be possibly vasovagal or orthostatic. 2 cardiac monitors (2021, 2023) were without significant  arrhythmia  - Patient denies recent syncope. Continues to have occasional dizziness, but it is overall better than it was in the past   History of polysubstance abuse - Patient has history of alcohol and cocaine use - Has not used cocaine in >2 years. Reports that he has not had any alcohol in "a long time" - He does admit to cigarette use. We discussed importance of tobacco cessation   History of CVA vs hemiplegic migraine  - 10/2023- seen by neurology, felt to be more likely hemiplegic migraine as head CT was negative. Patient did not tolerate MRI to confirm  - Followed by neurology   Carotid Artery Disease  - Carotid ultrasounds from 08/2022 showed 1-49% stenosis in bilateral ICAs  - Continue ASA, crestor  - Ordered ultrasounds for monitoring   HLD  - Lipid panel from 08/2022 showed LDL 63 - Continue crestor 20 mg daily      Informed Consent   Shared Decision Making/Informed Consent{  The risks [stroke (1 in 1000), death (1 in 1000), kidney failure [usually temporary] (1 in 500), bleeding (1 in 200), allergic reaction [possibly serious] (1 in 200)], benefits (diagnostic support and management of coronary artery disease) and alternatives of a cardiac catheterization were discussed in detail with Mr. Selbe and he is willing to proceed.     Dispo: Follow up 2-3 weeks after cath   Signed, Jonita Albee, PA-C

## 2024-02-02 DIAGNOSIS — R4701 Aphasia: Secondary | ICD-10-CM | POA: Diagnosis not present

## 2024-02-02 DIAGNOSIS — R471 Dysarthria and anarthria: Secondary | ICD-10-CM | POA: Diagnosis not present

## 2024-02-03 ENCOUNTER — Telehealth: Payer: Self-pay | Admitting: Neurology

## 2024-02-03 ENCOUNTER — Ambulatory Visit: Attending: Cardiology | Admitting: Cardiology

## 2024-02-03 ENCOUNTER — Encounter: Payer: Self-pay | Admitting: Cardiology

## 2024-02-03 VITALS — BP 116/70 | HR 58 | Ht 72.0 in | Wt 147.4 lb

## 2024-02-03 DIAGNOSIS — R55 Syncope and collapse: Secondary | ICD-10-CM | POA: Diagnosis not present

## 2024-02-03 DIAGNOSIS — E785 Hyperlipidemia, unspecified: Secondary | ICD-10-CM | POA: Diagnosis not present

## 2024-02-03 DIAGNOSIS — I25118 Atherosclerotic heart disease of native coronary artery with other forms of angina pectoris: Secondary | ICD-10-CM

## 2024-02-03 DIAGNOSIS — R079 Chest pain, unspecified: Secondary | ICD-10-CM | POA: Diagnosis not present

## 2024-02-03 DIAGNOSIS — I2510 Atherosclerotic heart disease of native coronary artery without angina pectoris: Secondary | ICD-10-CM

## 2024-02-03 DIAGNOSIS — I739 Peripheral vascular disease, unspecified: Secondary | ICD-10-CM

## 2024-02-03 DIAGNOSIS — Z720 Tobacco use: Secondary | ICD-10-CM

## 2024-02-03 NOTE — Patient Instructions (Addendum)
 Medication Instructions:  Your physician recommends that you continue on your current medications as directed. Please refer to the Current Medication list given to you today. *If you need a refill on your cardiac medications before your next appointment, please call your pharmacy*  Lab Work: Your physician recommends that you have labs drawn today: BMET & CBC  If you have labs (blood work) drawn today and your tests are completely normal, you will receive your results only by: MyChart Message (if you have MyChart) OR A paper copy in the mail If you have any lab test that is abnormal or we need to change your treatment, we will call you to review the results.  Testing/Procedures: Your physician has requested that you have a carotid duplex. This test is an ultrasound of the carotid arteries in your neck. It looks at blood flow through these arteries that supply the brain with blood. Allow one hour for this exam. There are no restrictions or special instructions. This will take place at [address] [facility], [address].  Please note: We ask at that you not bring children with you during ultrasound (echo/ vascular) testing. Due to room size and safety concerns, children are not allowed in the ultrasound rooms during exams. Our front office staff cannot provide observation of children in our lobby area while testing is being conducted. An adult accompanying a patient to their appointment will only be allowed in the ultrasound room at the discretion of the ultrasound technician under special circumstances. We apologize for any inconvenience.  Follow-Up: At [facility], you and your health needs are our priority.  As part of our continuing mission to provide you with exceptional heart care, we have created designated Provider Care Teams.  These Care Teams include your primary Cardiologist (physician) and Advanced Practice Providers (APPs -  Physician Assistants and Nurse Practitioners) who all work  together to provide you with the care you need, when you need it.  We recommend signing up for the patient portal called "MyChart".  Sign up information is provided on this After Visit Summary.  MyChart is used to connect with patients for Virtual Visits (Telemedicine).  Patients are able to view lab/test results, encounter notes, upcoming appointments, etc.  Non-urgent messages can be sent to your provider as well.   To learn more about what you can do with MyChart, go to [email or web address].    Your next appointment:   Already scheduled  Other Instructions         Cardiac/Peripheral Catheterization   You are scheduled for a Cardiac Catheterization on Wednesday, March 26 with Dr. Peter [place].  1. Please arrive at the [facility] (Main Entrance A) at [facility]: [address] [place], [place] [address] at 9:00 AM (This time is 2 hour(s) before your procedure to ensure your preparation).   Free valet parking service is available. You will check in at ADMITTING. The support person will be asked to wait in the waiting room.  It is OK to have someone drop you off and come back when you are ready to be discharged.        Special note: Every effort is made to have your procedure done on time. Please understand that emergencies sometimes delay scheduled procedures.  2. Diet: Do not eat solid foods after midnight.  You may have clear liquids until 5 AM the day of the procedure.  3. Labs: You will need to have blood drawn today (3/19).  4. Medication instructions in  preparation for your procedure:   On the morning of your procedure, take Aspirin 81 mg and any morning medicines NOT listed above.  You may use sips of water.  5. Plan to go home the same day, you will only stay overnight if medically necessary. 6. You MUST have a responsible adult to drive you home. 7. An adult MUST be with you the first 24 hours after you arrive home. 8. Bring a current list of your  medications, and the last time and date medication taken. 9. Bring ID and current insurance cards. 10.Please wear clothes that are easy to get on and off and wear slip-on shoes.  Thank you for allowing [place] to care for you!   -- [facility]

## 2024-02-03 NOTE — Telephone Encounter (Signed)
 Vinnie Langton PT with [facility] has called for verbal orders for Home health PT 2 week 4, 1 week 5 verbal orders to add Speech therapy and to discharge OT order, her # with secure vm is [phone]

## 2024-02-03 NOTE — Telephone Encounter (Signed)
 Verbal orders given to Spencer Mendoza and she voiced gratitude and understanding

## 2024-02-03 NOTE — Telephone Encounter (Signed)
 MR brain wo contrast sent to GI for scheduling. [employer] MCR does not require PA.

## 2024-02-04 ENCOUNTER — Telehealth: Payer: Self-pay

## 2024-02-04 LAB — CBC WITH DIFFERENTIAL/PLATELET
Basophils Absolute: 0.1 10*3/uL (ref 0.0–0.2)
Basos: 1 %
EOS (ABSOLUTE): 0.3 10*3/uL (ref 0.0–0.4)
Eos: 5 %
Hematocrit: 35.5 % — ABNORMAL LOW (ref 37.5–51.0)
Hemoglobin: 11.3 g/dL — ABNORMAL LOW (ref 13.0–17.7)
Immature Grans (Abs): 0 10*3/uL (ref 0.0–0.1)
Immature Granulocytes: 0 %
Lymphocytes Absolute: 1.8 10*3/uL (ref 0.7–3.1)
Lymphs: 31 %
MCH: 25.4 pg — ABNORMAL LOW (ref 26.6–33.0)
MCHC: 31.8 g/dL (ref 31.5–35.7)
MCV: 80 fL (ref 79–97)
Monocytes Absolute: 0.6 10*3/uL (ref 0.1–0.9)
Monocytes: 11 %
Neutrophils Absolute: 3 10*3/uL (ref 1.4–7.0)
Neutrophils: 52 %
Platelets: 258 10*3/uL (ref 150–450)
RBC: 4.45 x10E6/uL (ref 4.14–5.80)
RDW: 17.6 % — ABNORMAL HIGH (ref 11.6–15.4)
WBC: 5.7 10*3/uL (ref 3.4–10.8)

## 2024-02-04 LAB — BASIC METABOLIC PANEL
BUN/Creatinine Ratio: 9 — ABNORMAL LOW (ref 10–24)
BUN: 9 mg/dL (ref 8–27)
CO2: 22 mmol/L (ref 20–29)
Calcium: 9.6 mg/dL (ref 8.6–10.2)
Chloride: 101 mmol/L (ref 96–106)
Creatinine, Ser: 1 mg/dL (ref 0.76–1.27)
Glucose: 79 mg/dL (ref 70–99)
Potassium: 3.8 mmol/L (ref 3.5–5.2)
Sodium: 137 mmol/L (ref 134–144)
eGFR: 84 mL/min/{1.73_m2} (ref >59)

## 2024-02-04 NOTE — Telephone Encounter (Signed)
-----   Message from Jonita Albee sent at 02/04/2024  7:04 AM EDT ----- Please tell patient that his labwork from yesterday showed normal kidney function, normal electrolytes. Hemoglobin is a bit low, but appears to be stable. OK to proceed with cardiac catheterization   Thanks KJ

## 2024-02-04 NOTE — Telephone Encounter (Signed)
 Called patient advised of below they verbalized understanding.

## 2024-02-05 ENCOUNTER — Telehealth: Payer: Self-pay | Admitting: Family

## 2024-02-05 DIAGNOSIS — R4701 Aphasia: Secondary | ICD-10-CM | POA: Diagnosis not present

## 2024-02-05 DIAGNOSIS — R471 Dysarthria and anarthria: Secondary | ICD-10-CM | POA: Diagnosis not present

## 2024-02-05 NOTE — Telephone Encounter (Signed)
 I called and spoke with speech therapist and she will relay the message over the [facility] with PT, Neurology sent this referral over to home health, So I advised them to contact them for pt concerns per Dulce Sellar, NP.

## 2024-02-05 NOTE — Telephone Encounter (Signed)
 Copied from CRM [id number]. Topic: General - Other >> Feb 05, 2024 11:35 AM Deaijah H wrote: Reason for CRM: Almira PT [facility] checking vitals and patient currently has pain level 7 out of 10 (right shoulder/ whole right arm/leg and head)Tylenol not working. Seen by cardiologist on Wednesday , procedure next Wednesday and was advised no changes to medication until the after procedure. Please call [phone]

## 2024-02-08 ENCOUNTER — Telehealth: Payer: Self-pay | Admitting: Neurology

## 2024-02-08 DIAGNOSIS — R471 Dysarthria and anarthria: Secondary | ICD-10-CM | POA: Diagnosis not present

## 2024-02-08 DIAGNOSIS — R4701 Aphasia: Secondary | ICD-10-CM | POA: Diagnosis not present

## 2024-02-08 NOTE — Telephone Encounter (Signed)
 Requesting verbal order for speech therapy. Asking if can respond today, would like to see patient today by 3:00 pm Frequency: 1x wk for 8 wks

## 2024-02-08 NOTE — Telephone Encounter (Signed)
 Lvm 1st attempt by hf 02/08/24

## 2024-02-08 NOTE — Telephone Encounter (Signed)
 Erie Noe w/[facility] called back and I gave verbal orders for migraine

## 2024-02-09 ENCOUNTER — Telehealth: Payer: Self-pay | Admitting: *Deleted

## 2024-02-09 ENCOUNTER — Telehealth: Payer: Self-pay

## 2024-02-09 DIAGNOSIS — R471 Dysarthria and anarthria: Secondary | ICD-10-CM | POA: Diagnosis not present

## 2024-02-09 DIAGNOSIS — R4701 Aphasia: Secondary | ICD-10-CM | POA: Diagnosis not present

## 2024-02-09 NOTE — Telephone Encounter (Signed)
 Cardiac Catheterization scheduled at [facility] for: Wednesday February 10, 2024 11:30 AM Arrival time [facility] Main Entrance A at: 9:30 AM  Nothing to eat after midnight prior to procedure, clear liquids until 5 AM day of procedure.  Medication instructions: -Usual morning medications can be taken with sips of water including aspirin 81 mg.  Plan to go home the same day, you will only stay overnight if medically necessary.  You must have responsible adult to drive you home.  Someone must be with you the first 24 hours after you arrive home.  Reviewed procedure instructions with patient.

## 2024-02-09 NOTE — Telephone Encounter (Signed)
 Copied from CRM [id number]. Topic: Clinical - Home Health Verbal Orders >> Feb 09, 2024 12:48 PM [place] A wrote: Caller/Agency: [employer] [facility] Number: [phone] Service Requested: Physical Therapy Frequency: N/A Any new concerns about the patient? Yes Patient has Migraine today when he stands up it becomes dizzy. Patient did not have service today  Please advise

## 2024-02-10 ENCOUNTER — Ambulatory Visit (HOSPITAL_COMMUNITY)
Admission: RE | Admit: 2024-02-10 | Discharge: 2024-02-10 | Disposition: A | Discharge status: Home / Self Care | Attending: Cardiology | Admitting: Cardiology

## 2024-02-10 ENCOUNTER — Encounter (HOSPITAL_COMMUNITY)
Admission: RE | Disposition: A | Discharge status: Home / Self Care | Payer: Self-pay | Source: Home / Self Care | Attending: Cardiology

## 2024-02-10 ENCOUNTER — Encounter (HOSPITAL_COMMUNITY): Payer: Self-pay | Admitting: Cardiology

## 2024-02-10 ENCOUNTER — Telehealth: Payer: Self-pay | Admitting: Neurology

## 2024-02-10 ENCOUNTER — Ambulatory Visit: Admitting: Physician Assistant

## 2024-02-10 DIAGNOSIS — I4510 Unspecified right bundle-branch block: Secondary | ICD-10-CM | POA: Diagnosis not present

## 2024-02-10 DIAGNOSIS — F1911 Other psychoactive substance abuse, in remission: Secondary | ICD-10-CM | POA: Insufficient documentation

## 2024-02-10 DIAGNOSIS — I2510 Atherosclerotic heart disease of native coronary artery without angina pectoris: Secondary | ICD-10-CM | POA: Diagnosis not present

## 2024-02-10 DIAGNOSIS — E785 Hyperlipidemia, unspecified: Secondary | ICD-10-CM | POA: Insufficient documentation

## 2024-02-10 DIAGNOSIS — F17210 Nicotine dependence, cigarettes, uncomplicated: Secondary | ICD-10-CM | POA: Diagnosis not present

## 2024-02-10 DIAGNOSIS — I6523 Occlusion and stenosis of bilateral carotid arteries: Secondary | ICD-10-CM | POA: Insufficient documentation

## 2024-02-10 DIAGNOSIS — R079 Chest pain, unspecified: Secondary | ICD-10-CM

## 2024-02-10 DIAGNOSIS — Z79899 Other long term (current) drug therapy: Secondary | ICD-10-CM | POA: Insufficient documentation

## 2024-02-10 DIAGNOSIS — Z7982 Long term (current) use of aspirin: Secondary | ICD-10-CM | POA: Insufficient documentation

## 2024-02-10 HISTORY — PX: LEFT HEART CATH AND CORONARY ANGIOGRAPHY: CATH118249

## 2024-02-10 SURGERY — LEFT HEART CATH AND CORONARY ANGIOGRAPHY
Anesthesia: LOCAL

## 2024-02-10 MED ORDER — SODIUM CHLORIDE 0.9 % WEIGHT BASED INFUSION: 3.0000 mL/kg/h | INTRAVENOUS | Status: AC

## 2024-02-10 MED ORDER — HEPARIN (PORCINE) IN NACL 1000-0.9 UT/500ML-% IV SOLN
INTRAVENOUS | Status: DC | PRN
  Administered 2024-02-10 (×2): 500 mL

## 2024-02-10 MED ORDER — ALBUTEROL SULFATE (2.5 MG/3ML) 0.083% IN NEBU
INHALATION_SOLUTION | RESPIRATORY_TRACT | Status: AC
  Filled 2024-02-10: qty 3

## 2024-02-10 MED ORDER — FENTANYL CITRATE (PF) 100 MCG/2ML IJ SOLN
INTRAMUSCULAR | Status: DC | PRN
  Administered 2024-02-10: 25 ug via INTRAVENOUS

## 2024-02-10 MED ORDER — CLONAZEPAM 0.5 MG PO TBDP
0.5000 mg | ORAL_TABLET | Freq: Once | ORAL | Status: AC
  Administered 2024-02-10: 0.5 mg via ORAL
  Filled 2024-02-10: qty 1

## 2024-02-10 MED ORDER — SODIUM CHLORIDE 0.9 % WEIGHT BASED INFUSION: 1.0000 mL/kg/h | INTRAVENOUS | Status: DC

## 2024-02-10 MED ORDER — FENTANYL CITRATE (PF) 100 MCG/2ML IJ SOLN
INTRAMUSCULAR | Status: AC
  Filled 2024-02-10: qty 2

## 2024-02-10 MED ORDER — MIDAZOLAM HCL 2 MG/2ML IJ SOLN
INTRAMUSCULAR | Status: DC | PRN
  Administered 2024-02-10: 1 mg via INTRAVENOUS

## 2024-02-10 MED ORDER — HEPARIN SODIUM (PORCINE) 1000 UNIT/ML IJ SOLN
INTRAMUSCULAR | Status: DC | PRN
  Administered 2024-02-10: 3500 [IU] via INTRAVENOUS

## 2024-02-10 MED ORDER — VERAPAMIL HCL 2.5 MG/ML IV SOLN
INTRAVENOUS | Status: AC
  Filled 2024-02-10: qty 2

## 2024-02-10 MED ORDER — ASPIRIN 81 MG PO CHEW
81.0000 mg | CHEWABLE_TABLET | ORAL | Status: AC
Start: 2024-02-11 — End: 2024-02-10
  Administered 2024-02-10: 81 mg via ORAL
  Filled 2024-02-10: qty 1

## 2024-02-10 MED ORDER — HEPARIN SODIUM (PORCINE) 1000 UNIT/ML IJ SOLN
INTRAMUSCULAR | Status: AC
Start: 2024-02-10 — End: ?
  Filled 2024-02-10: qty 10

## 2024-02-10 MED ORDER — MIDAZOLAM HCL 2 MG/2ML IJ SOLN
INTRAMUSCULAR | Status: AC
  Filled 2024-02-10: qty 2

## 2024-02-10 MED ORDER — ALBUTEROL SULFATE (2.5 MG/3ML) 0.083% IN NEBU
2.5000 mg | INHALATION_SOLUTION | Freq: Once | RESPIRATORY_TRACT | Status: AC
  Administered 2024-02-10: 2.5 mg via RESPIRATORY_TRACT

## 2024-02-10 MED ORDER — LIDOCAINE HCL (PF) 1 % IJ SOLN
INTRAMUSCULAR | Status: AC
  Filled 2024-02-10: qty 30

## 2024-02-10 MED ORDER — LIDOCAINE HCL (PF) 1 % IJ SOLN
INTRAMUSCULAR | Status: DC | PRN
  Administered 2024-02-10: 2 mL

## 2024-02-10 MED ORDER — IOHEXOL 350 MG/ML SOLN
INTRAVENOUS | Status: DC | PRN
  Administered 2024-02-10: 30 mL

## 2024-02-10 MED ORDER — VERAPAMIL HCL 2.5 MG/ML IV SOLN
INTRAVENOUS | Status: DC | PRN
  Administered 2024-02-10: 10 mL via INTRA_ARTERIAL

## 2024-02-10 SURGICAL SUPPLY — 9 items
CATH INFINITI 5 FR JL3.5 (CATHETERS) IMPLANT
CATH INFINITI JR4 5F (CATHETERS) IMPLANT
DEVICE RAD COMP TR BAND LRG (VASCULAR PRODUCTS) IMPLANT
GLIDESHEATH SLEND SS 6F .021 (SHEATH) IMPLANT
GUIDEWIRE INQWIRE 1.5J.035X260 (WIRE) IMPLANT
INQWIRE 1.5J .035X260CM (WIRE) ×1 IMPLANT
PACK CARDIAC CATHETERIZATION (CUSTOM PROCEDURE TRAY) ×1 IMPLANT
SET ATX-X65L (MISCELLANEOUS) IMPLANT
SHEATH PROBE COVER 6X72 (BAG) IMPLANT

## 2024-02-10 NOTE — Telephone Encounter (Signed)
 Spencer Mendoza from [facility] called needing VO to be paused this week due to pt getting a heart Cath put in and not able to do physical activity.

## 2024-02-10 NOTE — Progress Notes (Signed)
 TR band removed at 1430, gauze dressing applied. Right radial level 0, clean, dry, and intact. Patient walked to the bathroom without difficulties

## 2024-02-10 NOTE — Interval H&P Note (Signed)
 History and Physical Interval Note:  02/10/2024 12:01 PM  Spencer Mendoza  has presented today for surgery, with the diagnosis of angina.  The various methods of treatment have been discussed with the patient and family. After consideration of risks, benefits and other options for treatment, the patient has consented to  Procedure(s): LEFT HEART CATH AND CORONARY ANGIOGRAPHY (N/A) as a surgical intervention.  The patient's history has been reviewed, patient examined, no change in status, stable for surgery.  I have reviewed the patient's chart and labs.  Questions were answered to the patient's satisfaction.   Cath Lab Visit (complete for each Cath Lab visit)  Clinical Evaluation Leading to the Procedure:   ACS: No.  Non-ACS:    Anginal Classification: CCS III  Anti-ischemic medical therapy: No Therapy  Non-Invasive Test Results: No non-invasive testing performed  Prior CABG: No previous CABG        Theron Arista [facility] 02/10/2024 12:01 PM

## 2024-02-10 NOTE — Telephone Encounter (Signed)
 Called back and gave verbal order to restart PT when applicable for patient

## 2024-02-10 NOTE — Discharge Instructions (Signed)
 Radial Site Care The following information offers guidance on how to care for yourself after your procedure. Your health care provider may also give you more specific instructions. If you have problems or questions, contact your health care provider. What can I expect after the procedure? After the procedure, it is common to have bruising and tenderness in the incision area. Follow these instructions at home: Incision site care  Follow instructions from your health care provider about how to take care of your incision site. Make sure you: Wash your hands with soap and water for at least 20 seconds before and after you change your bandage (dressing). If soap and water are not available, use hand sanitizer. Remove your dressing in 24 hours. Leave stitches (sutures), skin glue, or adhesive strips in place. These skin closures may need to stay in place for 2 weeks or longer. If adhesive strip edges start to loosen and curl up, you may trim the loose edges. Do not remove adhesive strips completely unless your health care provider tells you to do that. Do not take baths, swim, or use a hot tub for at least 1 week. You may shower 24 hours after the procedure or as told by your health care provider. Remove the dressing and gently wash the incision area with plain soap and water. Pat the area dry with a clean towel. Do not rub the site. That could cause bleeding. Do not apply powder or lotion to the site. Check your incision site every day for signs of infection. Check for: Redness, swelling, or pain. Fluid or blood. Warmth. Pus or a bad smell. Activity For 24 hours after the procedure, or as directed by your health care provider: Do not flex or bend the affected arm. Do not push or pull heavy objects with the affected arm. Do not operate machinery or power tools. Do not drive. You should not drive yourself home from the hospital or clinic if you go home during that time period. You may drive 24  hours after the procedure unless your health care provider tells you not to. Do not lift anything that is heavier than 10 lb (4.5 kg), or the limit that you are told, until your health care provider says that it is safe. Return to your normal activities as told by your health care provider. Ask your health care provider what activities are safe for you and when you can return to work. If you were given a sedative during the procedure, it can affect you for several hours. Do not drive or operate machinery until your health care provider says that it is safe. General instructions Take over-the-counter and prescription medicines only as told by your health care provider. If you will be going home right after the procedure, plan to have a responsible adult care for you for the time you are told. This is important. Keep all follow-up visits. This is important. Contact a health care provider if: You have a fever or chills. You have any of these signs of infection at your incision site: Redness, swelling, or pain. Fluid or blood. Warmth. Pus or a bad smell. Get help right away if: The incision area swells very fast. The incision area is bleeding, and the bleeding does not stop when you hold steady pressure on the area. Your arm or hand becomes pale, cool, tingly, or numb. These symptoms may represent a serious problem that is an emergency. Do not wait to see if the symptoms will go away. Get medical  help right away. Call your local emergency services (911 in the [place] Do not drive yourself to the hospital. Summary After the procedure, it is common to have bruising and tenderness at the incision site. Follow instructions from your health care provider about how to take care of your radial site incision. Check the incision every day for signs of infection. Do not lift anything that is heavier than 10 lb (4.5 kg), or the limit that you are told, until your health care provider says that it is  safe. Get help right away if the incision area swells very fast, you have bleeding at the incision site that will not stop, or your arm or hand becomes pale, cool, or numb. This information is not intended to replace advice given to you by your health care provider. Make sure you discuss any questions you have with your health care provider. Document Revised: 12/23/2020 Document Reviewed: 12/23/2020 Elsevier Patient Education  2024 [employer].

## 2024-02-10 NOTE — Telephone Encounter (Signed)
 Noted.

## 2024-02-11 ENCOUNTER — Emergency Department (HOSPITAL_COMMUNITY)
Admission: EM | Admit: 2024-02-11 | Discharge: 2024-02-11 | Disposition: A | Discharge status: Home / Self Care | Attending: Emergency Medicine | Admitting: Emergency Medicine

## 2024-02-11 ENCOUNTER — Emergency Department (HOSPITAL_COMMUNITY)

## 2024-02-11 ENCOUNTER — Other Ambulatory Visit: Payer: Self-pay

## 2024-02-11 DIAGNOSIS — Z79899 Other long term (current) drug therapy: Secondary | ICD-10-CM | POA: Insufficient documentation

## 2024-02-11 DIAGNOSIS — I10 Essential (primary) hypertension: Secondary | ICD-10-CM | POA: Insufficient documentation

## 2024-02-11 DIAGNOSIS — J449 Chronic obstructive pulmonary disease, unspecified: Secondary | ICD-10-CM | POA: Diagnosis not present

## 2024-02-11 DIAGNOSIS — Z96611 Presence of right artificial shoulder joint: Secondary | ICD-10-CM | POA: Diagnosis not present

## 2024-02-11 DIAGNOSIS — R110 Nausea: Secondary | ICD-10-CM | POA: Diagnosis not present

## 2024-02-11 DIAGNOSIS — R0689 Other abnormalities of breathing: Secondary | ICD-10-CM | POA: Diagnosis not present

## 2024-02-11 DIAGNOSIS — R079 Chest pain, unspecified: Secondary | ICD-10-CM | POA: Diagnosis not present

## 2024-02-11 DIAGNOSIS — R059 Cough, unspecified: Secondary | ICD-10-CM | POA: Diagnosis not present

## 2024-02-11 DIAGNOSIS — I2510 Atherosclerotic heart disease of native coronary artery without angina pectoris: Secondary | ICD-10-CM | POA: Diagnosis not present

## 2024-02-11 DIAGNOSIS — R112 Nausea with vomiting, unspecified: Secondary | ICD-10-CM | POA: Insufficient documentation

## 2024-02-11 DIAGNOSIS — R071 Chest pain on breathing: Secondary | ICD-10-CM | POA: Diagnosis not present

## 2024-02-11 DIAGNOSIS — R509 Fever, unspecified: Secondary | ICD-10-CM | POA: Insufficient documentation

## 2024-02-11 DIAGNOSIS — R001 Bradycardia, unspecified: Secondary | ICD-10-CM | POA: Diagnosis not present

## 2024-02-11 DIAGNOSIS — R1084 Generalized abdominal pain: Secondary | ICD-10-CM | POA: Diagnosis not present

## 2024-02-11 DIAGNOSIS — R519 Headache, unspecified: Secondary | ICD-10-CM | POA: Diagnosis not present

## 2024-02-11 DIAGNOSIS — J439 Emphysema, unspecified: Secondary | ICD-10-CM | POA: Diagnosis not present

## 2024-02-11 LAB — CBC
HCT: 36.8 % — ABNORMAL LOW (ref 39.0–52.0)
Hemoglobin: 11.9 g/dL — ABNORMAL LOW (ref 13.0–17.0)
MCH: 24.8 pg — ABNORMAL LOW (ref 26.0–34.0)
MCHC: 32.3 g/dL (ref 30.0–36.0)
MCV: 76.8 fL — ABNORMAL LOW (ref 80.0–100.0)
Platelets: 345 10*3/uL (ref 150–400)
RBC: 4.79 MIL/uL (ref 4.22–5.81)
RDW: 18.6 % — ABNORMAL HIGH (ref 11.5–15.5)
WBC: 11.1 10*3/uL — ABNORMAL HIGH (ref 4.0–10.5)
nRBC: 0 % (ref 0.0–0.2)

## 2024-02-11 LAB — RESP PANEL BY RT-PCR (RSV, FLU A&B, COVID)  RVPGX2
Influenza A by PCR: NEGATIVE
Influenza B by PCR: NEGATIVE
Resp Syncytial Virus by PCR: NEGATIVE
SARS Coronavirus 2 by RT PCR: NEGATIVE

## 2024-02-11 LAB — TROPONIN I (HIGH SENSITIVITY)
Troponin I (High Sensitivity): 15 ng/L (ref <18)
Troponin I (High Sensitivity): 14 ng/L (ref <18)

## 2024-02-11 LAB — LIPASE, BLOOD: Lipase: 29 U/L (ref 11–51)

## 2024-02-11 LAB — COMPREHENSIVE METABOLIC PANEL WITH GFR
ALT: 34 U/L (ref 0–44)
AST: 44 U/L — ABNORMAL HIGH (ref 15–41)
Albumin: 4.8 g/dL (ref 3.5–5.0)
Alkaline Phosphatase: 64 U/L (ref 38–126)
Anion gap: 19 — ABNORMAL HIGH (ref 5–15)
BUN: 24 mg/dL — ABNORMAL HIGH (ref 8–23)
CO2: 19 mmol/L — ABNORMAL LOW (ref 22–32)
Calcium: 10.3 mg/dL (ref 8.9–10.3)
Chloride: 98 mmol/L (ref 98–111)
Creatinine, Ser: 1.4 mg/dL — ABNORMAL HIGH (ref 0.61–1.24)
GFR, Estimated: 56 mL/min — ABNORMAL LOW (ref >60)
Glucose, Bld: 127 mg/dL — ABNORMAL HIGH (ref 70–99)
Potassium: 3.5 mmol/L (ref 3.5–5.1)
Sodium: 136 mmol/L (ref 135–145)
Total Bilirubin: 1.1 mg/dL (ref 0.0–1.2)
Total Protein: 7.7 g/dL (ref 6.5–8.1)

## 2024-02-11 LAB — ETHANOL: Alcohol, Ethyl (B): <10 mg/dL (ref <10)

## 2024-02-11 MED ORDER — MORPHINE SULFATE (PF) 4 MG/ML IV SOLN
4.0000 mg | Freq: Once | INTRAVENOUS | Status: AC
  Administered 2024-02-11: 4 mg via INTRAVENOUS
  Filled 2024-02-11: qty 1

## 2024-02-11 MED ORDER — LEVETIRACETAM 500 MG PO TABS
1000.0000 mg | ORAL_TABLET | Freq: Once | ORAL | Status: AC
  Administered 2024-02-11: 1000 mg via ORAL
  Filled 2024-02-11: qty 2

## 2024-02-11 MED ORDER — ONDANSETRON 4 MG PO TBDP: 4.0000 mg | ORAL_TABLET | Freq: Three times a day (TID) | ORAL | 0 refills | Status: AC | PRN

## 2024-02-11 MED ORDER — LORAZEPAM 1 MG PO TABS
1.0000 mg | ORAL_TABLET | Freq: Once | ORAL | Status: AC
  Administered 2024-02-11: 1 mg via ORAL
  Filled 2024-02-11: qty 1

## 2024-02-11 MED ORDER — SODIUM CHLORIDE 0.9 % IV BOLUS
1000.0000 mL | Freq: Once | INTRAVENOUS | Status: AC
  Administered 2024-02-11: 1000 mL via INTRAVENOUS

## 2024-02-11 MED ORDER — ALUM & MAG HYDROXIDE-SIMETH 200-200-20 MG/5ML PO SUSP
30.0000 mL | Freq: Once | ORAL | Status: AC
  Administered 2024-02-11: 30 mL via ORAL
  Filled 2024-02-11: qty 30

## 2024-02-11 MED ORDER — ONDANSETRON HCL 4 MG/2ML IJ SOLN
4.0000 mg | Freq: Once | INTRAMUSCULAR | Status: AC
  Administered 2024-02-11: 4 mg via INTRAVENOUS
  Filled 2024-02-11: qty 2

## 2024-02-11 NOTE — ED Provider Notes (Signed)
 Emergency Department Provider Note   I have reviewed the triage vital signs and the nursing notes.   HISTORY  Chief Complaint Vomiting   HPI Spencer Mendoza is a 63 y.o. male past history of substance use, alcohol use, nonobstructive CAD, COPD presents to the emergency department with nausea and vomiting along with chest and abdominal pain.  Of note, patient had an outpatient left heart cath yesterday showing minimal nonobstructive coronary disease, not requiring any stenting or other intervention.  States he went home was feeling okay when symptoms suddenly began.  He describes pain throughout his chest and belly with multiple episodes of dry heaving. Also notes some low-grade fever and cough.   Past Medical History:  Diagnosis Date   Abdominal pain 08/30/2019   Alcohol withdrawal (HCC) 07/29/2013   Allergy    Anxiety    Arthritis    Benzodiazepine abuse (HCC) 08/30/2019   Bipolar disorder (HCC)    CAD (coronary artery disease)    Carotid arterial disease (HCC)    Chronic insomnia 06/06/2015   Chronic low back pain 12/28/2013   Chronic migraine without aura, with intractable migraine, so stated, with status migrainosus 08/25/2019   Claustrophobia    Cocaine abuse (HCC) 05/22/2015   COPD (chronic obstructive pulmonary disease) (HCC)    Delirium tremens (HCC) 07/29/2013   Epilepsy (HCC)    last seizure week of 06-25-2020- sev. grand mal seizures per pt    Exanthem due to herpes zoster 04/01/2021   Facial droop 09/07/2022   Fall at home, initial encounter 09/07/2022   Hemiplegic migraine with status migrainosus 06/06/2015   Hypertension 12/04/2013   Migraine    Multiple rib fractures 09/08/2022   Opioid use disorder 08/30/2019   Osteoarthritis of left wrist 01/04/2020   PAD (peripheral artery disease) (HCC)    PONV (postoperative nausea and vomiting)    Prolonged Q-T interval on ECG    Rib fractures 09/07/2022   Sinus bradycardia 09/07/2022   Stroke [facility])     patient denies- states had a hemiplegic migraine in ~2011   Substance abuse (HCC)    Syncope 09/07/2022    Review of Systems  Constitutional: No fever/chills Cardiovascular: Positive chest pain. Respiratory: Denies shortness of breath. Gastrointestinal: Positive abdominal pain. Positive nausea and vomiting.  No diarrhea.  Skin: Negative for rash. Neurological: Positive HA.  ____________________________________________   PHYSICAL EXAM:  VITAL SIGNS: ED Triage Vitals [02/11/24 1944]  Encounter Vitals Group     BP (!) 113/90     Pulse Rate 85     Resp 18     Temp 99.1 F (37.3 C)     Temp Source Oral     SpO2 100 %   Constitutional: Alert and oriented. Patient with active dry-heaving.  Eyes: Conjunctivae are normal.  Head: Atraumatic. Nose: No congestion/rhinnorhea. Mouth/Throat: Mucous membranes are moist.  Neck: No stridor.   Cardiovascular: Normal rate, regular rhythm. Good peripheral circulation. Grossly normal heart sounds. Wrist cath puncture site is well-appearing.  Respiratory: Normal respiratory effort.  No retractions. Lungs CTAB. Gastrointestinal: Soft and nontender. No distention.  Musculoskeletal: No gross deformities of extremities. Neurologic:  Normal speech and language.  Skin:  Skin is warm, dry and intact. No rash noted.  ____________________________________________   LABS (all labs ordered are listed, but only abnormal results are displayed)  Labs Reviewed  COMPREHENSIVE METABOLIC PANEL WITH GFR - Abnormal; Notable for the following components:      Result Value   CO2 19 (*)  Glucose, Bld 127 (*)    BUN 24 (*)    Creatinine, Ser 1.40 (*)    AST 44 (*)    GFR, Estimated 56 (*)    Anion gap 19 (*)    All other components within normal limits  CBC - Abnormal; Notable for the following components:   WBC 11.1 (*)    Hemoglobin 11.9 (*)    HCT 36.8 (*)    MCV 76.8 (*)    MCH 24.8 (*)    RDW 18.6 (*)    All other components within normal  limits  RESP PANEL BY RT-PCR (RSV, FLU A&B, COVID)  RVPGX2  LIPASE, BLOOD  ETHANOL  TROPONIN I (HIGH SENSITIVITY)  TROPONIN I (HIGH SENSITIVITY)   ____________________________________________  EKG   EKG Interpretation Date/Time:  Thursday February 11 2024 19:47:20 EDT Ventricular Rate:  72 PR Interval:  200 QRS Duration:  112 QT Interval:  392 QTC Calculation: 429 R Axis:   113  Text Interpretation: Sinus rhythm with marked sinus arrhythmia Right axis deviation Cannot rule out Anterior infarct , age undetermined Abnormal ECG When compared with ECG of 03-Feb-2024 15:12, PREVIOUS ECG IS PRESENT Confirmed by Alona Bene [phone]) on 02/11/2024 8:19:08 PM        ____________________________________________   PROCEDURES  Procedure(s) performed:   Procedures  None  ____________________________________________   INITIAL IMPRESSION / ASSESSMENT AND PLAN / ED COURSE  Pertinent labs & imaging results that were available during my care of the patient were reviewed by me and considered in my medical decision making (see chart for details).   This patient is Presenting for Evaluation of abdominal pain, which does require a range of treatment options, and is a complaint that involves a high risk of morbidity and mortality.  The Differential Diagnoses includes but is not exclusive to acute cholecystitis, intrathoracic causes for epigastric abdominal pain, gastritis, duodenitis, pancreatitis, small bowel or large bowel obstruction, abdominal aortic aneurysm, hernia, gastritis, etc.   Critical Interventions-    Medications  ondansetron (ZOFRAN) injection 4 mg (4 mg Intravenous Given 02/11/24 2023)  morphine (PF) 4 MG/ML injection 4 mg (4 mg Intravenous Given 02/11/24 2027)  sodium chloride 0.9 % bolus 1,000 mL (0 mLs Intravenous Stopped 02/11/24 2325)  levETIRAcetam (KEPPRA) tablet 1,000 mg (1,000 mg Oral Given 02/11/24 2213)  LORazepam (ATIVAN) tablet 1 mg (1 mg Oral Given 02/11/24 2213)   alum & mag hydroxide-simeth (MAALOX/MYLANTA) 200-200-20 MG/5ML suspension 30 mL (30 mLs Oral Given 02/11/24 2212)    Reassessment after intervention: pain improved. Patient tolerating PO.   I decided to review pertinent External Data, and in summary patient with non-occlusive CAD on cath yesterday.   Clinical Laboratory Tests Ordered, included creatinine mildly elevated at 1.4.  Slight gap acidosis likely in the setting of dehydration.  Minimal leukocytosis.  Alcohol, lipase, respiratory panel, troponin negative.   Radiologic Tests Ordered, included CXR. I independently interpreted the images and agree with radiology interpretation.   Cardiac Monitor Tracing which shows NSR.    Social Determinants of Health Risk patient is a smoker.   Medical Decision Making: Summary:  Patient presents emergency department with dry heaving and pain.  No focal tenderness on abdominal exam.  Cath yesterday shows nonocclusive CAD and required no intervention.  Doubt atypical ACS but will obtain screening troponins and EKG in the setting.  Plan to treat symptoms and reassess.  Reevaluation with update and discussion with patient.  He is feeling better after treatment.  Abdominal exam remains benign.  Plan for p.o. home medications per request and will follow second troponin.  Plan to discharge if negative.   Considered admission but ED workup is reassuring.   Patient's presentation is most consistent with acute presentation with potential threat to life or bodily function.   Disposition: discharge  ____________________________________________  FINAL CLINICAL IMPRESSION(S) / ED DIAGNOSES  Final diagnoses:  Generalized abdominal pain  Nausea and vomiting, unspecified vomiting type   Note:  This document was prepared using Dragon voice recognition software and may include unintentional dictation errors.  Alona Bene, MD, [facility]    Karizma Cheek, Arlyss Repress, MD 02/15/24 [phone]

## 2024-02-11 NOTE — Discharge Instructions (Signed)

## 2024-02-11 NOTE — ED Notes (Signed)
 Provider aware

## 2024-02-11 NOTE — ED Notes (Signed)
 Pt provided with taxi voucher upon discharge.

## 2024-02-11 NOTE — ED Triage Notes (Signed)
 Pt had outpt heart catheter yesterday and after discharged home (approx 1500) began having headache, cough, low grade fever and vomiting. Pt denies specific chest pain but reports generalized body aches.

## 2024-02-11 NOTE — ED Notes (Signed)
Pt given water and encouraged to drink 

## 2024-02-17 ENCOUNTER — Telehealth: Payer: Self-pay

## 2024-02-17 NOTE — Telephone Encounter (Signed)
 [facility] called and stated that the pt wanted to stop home health.

## 2024-02-24 NOTE — Telephone Encounter (Signed)
 Anna,RN with [facility] has called to report they are discharging pt per his request.  He has refused all services, if there are questions Tobi Bastos, RN can be reached at [phone]

## 2024-02-26 NOTE — Progress Notes (Signed)
 Cardiology Office Note:  .   Date:  03/10/2024  ID:  Spencer Mendoza, DOB [birth year], MRN [id number] PCP: Spencer Gore, NP  [facility] Providers Cardiologist:  Spencer Gathers, MD Cardiology APP:  Spencer Pilot, PA   History of Present Illness: Spencer Mendoza   Spencer Mendoza is a 63 y.o. male with a past medical history of nonobstructive CAD on coronary CTA 2021, vasovagal syncope, PAD, carotid artery disease, sinus bradycardia, PSVT, PAT, junctional tachycardia, alcohol  abuse, prior cocaine abuse, epilepsy, migraines, COPD, possible pulmonary fibrosis.  Patient is followed by Dr. Renna Mendoza, presents today for follow-up after echocardiogram.   Patient was seen by Dr. Renna Mendoza in 2021 for evaluation of syncope.  Echocardiogram 03/22/2020 showed EF 50-55%, no regional wall motion abnormalities, normal RV function, mild MR.  Underwent coronary CTA on 03/29/2020 that showed a coronary calcium  score of 330 (94th percentile), moderate plaque in the mid LAD and mild plaque in RCA.  Study was sent for [facility] which showed no evidence of flow-limiting CAD.  A cardiac monitor in 04/2020 showed sinus rhythm, rare junctional tachycardia, occasional SVT, PAT.  Overall, it was felt that his syncope was possibly vasovagal.  Overall, not felt to be cardiac in etiology.   Patient admitted to the hospital in 08/2022 with multiple recurrent episodes of syncope.  Reported 10 episodes of syncope in the last 6 months.  Episodes primarily occurred when getting up to go to the bathroom at night.  Echocardiogram 09/08/2022 showed EF 60-65%, no wall motion abnormalities, mild LVH, normal RV function, normal PA systolic pressure.  Carotid ultrasounds 09/08/2022 showed 1-49% stenosis in bilateral ICAs.  A cardiac monitor in 09/2022 showed rare PACs, rare PVCs.   Patient was later admitted in 09/2022 with delirium due to alcohol  intoxication.  He ultimately left AMA.  He was seen in follow-up on 10/15/2022.  At that time, patient  reported that he had not been taking midodrine  due to not having refills.  He denied recurrent loss of consciousness, continue to have dizziness upon standing.   Patient admitted in 10/2023 with symptoms concerning for CVA.  However, symptoms had started 2 weeks prior.  MRI was ordered but patient was unable to tolerate MRI due to claustrophobia.  He was started on Keppra  and Zonegran .  Per notes, differential was CVA versus hemiplegic migraine.  Overall, felt less likely CVA as CT head was negative.   Patient was seen by cardiology on 01/22/2024 in the clinic.  He reported having chest pain and dizziness when getting up to urinate during the night.  Chest pain occurred every day, lasts between 5 to 30 minutes.  Chest pain was worse with activity, relieved with rest.  Planned to obtain echocardiogram.  If echocardiogram was largely unremarkable, plan to repeat CT coronary.  If echocardiogram is abnormal, recommended proceeding with definitive angiography.  He underwent echocardiogram on 01/25/2024 that showed EF 55-60%, no regional wall motion abnormalities, normal RV function, mild MR.  Patient was set up for a cardiac catheterization that showed minimal nonobstructive CAD with normal LV function and normal LVEDP   Today, patient presents for a follow up appointment following recent cardiac catheterization. We thoroughly reviewed the results of his echocardiogram and cardiac catheterization. Patient reports that he has continued to have episodes of chest pain and shortness of breath, particularly during physical exertion such as yard work. The patient reports that the pain is sharp, located in the chest, and subsides upon rest. The patient also experiences numbness, tingling, and  pain in the right leg, which worsens with prolonged walking. The patient is a former heavy smoker, now reduced to a pack a week. The patient is currently on inhalers for COPD, which help with breathing but do not alleviate the chest  pain. He denies syncope, near syncope. Denies palpitations or lower extremity swelling   ROS: Per HPI   Studies Reviewed: .   Cardiac Studies & Procedures   ______________________________________________________________________________________________ CARDIAC CATHETERIZATION  CARDIAC CATHETERIZATION 02/10/2024  Conclusion Minimal nonobstructive CAD Normal LV function Normal LVEDP  Plan: consider other causes of chest pain.  Findings Coronary Findings Diagnostic  Dominance: Right  Left Main Vessel was injected. Vessel is normal in caliber. Vessel is angiographically normal.  Left Anterior Descending Mid LAD lesion is 20% stenosed.  Left Circumflex Vessel was injected. Vessel is normal in caliber. Vessel is angiographically normal.  Right Coronary Artery Prox RCA lesion is 20% stenosed.  Intervention  No interventions have been documented.     ECHOCARDIOGRAM  ECHOCARDIOGRAM COMPLETE 01/25/2024  Narrative ECHOCARDIOGRAM REPORT    Patient Name:   Spencer Mendoza Date of Exam: 01/25/2024 Medical Rec #:  [id number]         Height:       72.0 in Accession #:    [id number]        Weight:       145.8 lb Date of Birth:  [birth year]         BSA:          1.863 m Patient Age:    62 years          BP:           124/72 mmHg Patient Gender: M                 HR:           60 bpm. Exam Location:  [facility]  Procedure: 2D Echo, 3D Echo, Cardiac Doppler, Color Doppler and Strain Analysis (Both Spectral and Color Flow Doppler were utilized during procedure).  STAT ECHO  Indications:    R07.9* Chest pain, unspecified  History:        Patient has prior history of Echocardiogram examinations, most recent 09/08/2022. CAD, Abnormal ECG, Stroke, COPD, PAD and Carotid Disease, Arrythmias:Bradycardia, Tachycardia and RBBB, Signs/Symptoms:Syncope, Chest Pain and Shortness of Breath; Risk Factors:Current Smoker.  Sonographer:    Spencer Mendoza MHA, BS, RDCS Referring Phys:  [id number] Spencer Mendoza  IMPRESSIONS   1. Left ventricular ejection fraction, by estimation, is 55 to 60%. Left ventricular ejection fraction by 3D volume is 66 %. The left ventricle has normal function. The left ventricle has no regional wall motion abnormalities. Left ventricular diastolic parameters were normal. The average left ventricular global longitudinal strain is -18.2 %. The global longitudinal strain is normal. 2. Right ventricular systolic function is normal. The right ventricular size is normal. Tricuspid regurgitation signal is inadequate for assessing PA pressure. 3. The mitral valve is normal in structure. Mild mitral valve regurgitation. No evidence of mitral stenosis. 4. The aortic valve is tricuspid. Aortic valve regurgitation is mild. No aortic stenosis is present. 5. The inferior vena cava is normal in size with greater than 50% respiratory variability, suggesting right atrial pressure of 3 mmHg.  FINDINGS Left Ventricle: Left ventricular ejection fraction, by estimation, is 55 to 60%. Left ventricular ejection fraction by 3D volume is 66 %. The left ventricle has normal function. The left ventricle has no regional wall motion abnormalities. The average left  ventricular global longitudinal strain is -18.2 %. Strain was performed and the global longitudinal strain is normal. The left ventricular internal cavity size was normal in size. There is no left ventricular hypertrophy. Left ventricular diastolic parameters were normal.  Right Ventricle: The right ventricular size is normal. No increase in right ventricular wall thickness. Right ventricular systolic function is normal. Tricuspid regurgitation signal is inadequate for assessing PA pressure.  Left Atrium: Left atrial size was normal in size.  Right Atrium: Right atrial size was normal in size.  Pericardium: There is no evidence of pericardial effusion.  Mitral Valve: The mitral valve is normal in structure. Mild  mitral valve regurgitation. No evidence of mitral valve stenosis.  Tricuspid Valve: The tricuspid valve is normal in structure. Tricuspid valve regurgitation is mild . No evidence of tricuspid stenosis.  Aortic Valve: The aortic valve is tricuspid. Aortic valve regurgitation is mild. No aortic stenosis is present. Aortic valve mean gradient measures 3.0 mmHg. Aortic valve peak gradient measures 6.1 mmHg.  Pulmonic Valve: The pulmonic valve was normal in structure. Pulmonic valve regurgitation is not visualized. No evidence of pulmonic stenosis.  Aorta: The aortic root is normal in size and structure.  Venous: The inferior vena cava is normal in size with greater than 50% respiratory variability, suggesting right atrial pressure of 3 mmHg.  IAS/Shunts: No atrial level shunt detected by color flow Doppler.  Additional Comments: 3D was performed not requiring image post processing on an independent workstation and was normal.   LEFT VENTRICLE PLAX 2D LVIDd:         4.70 cm         Diastology LVIDs:         3.30 cm         LV e' medial:    8.92 cm/s LV PW:         0.80 cm         LV E/e' medial:  8.5 LV IVS:        0.80 cm         LV e' lateral:   12.30 cm/s LV E/e' lateral: 6.2  2D Longitudinal Strain 2D Strain GLS   -18.2 % Avg:  3D Volume EF LV 3D EF:    Left ventricul ar ejection fraction by 3D volume is 66 %.  3D Volume EF: 3D EF:        66 % LV EDV:       147 ml LV ESV:       51 ml LV SV:        97 ml  RIGHT VENTRICLE             IVC RV Basal diam:  2.50 cm     IVC diam: 2.80 cm RV Mid diam:    2.40 cm RV S prime:     13.80 cm/s TAPSE (M-mode): 1.7 cm  LEFT ATRIUM             Index        RIGHT ATRIUM           Index LA diam:        3.20 cm 1.72 cm/m   RA Area:     13.70 cm LA Vol (A2C):   69.5 ml 37.31 ml/m  RA Volume:   29.40 ml  15.78 ml/m LA Vol (A4C):   36.4 ml 19.54 ml/m LA Biplane Vol: 56.0 ml 30.06 ml/m AORTIC VALVE AV Vmax:  123.00  cm/s AV Vmean:          79.900 cm/s AV VTI:            0.268 m AV Peak Grad:      6.1 mmHg AV Mean Grad:      3.0 mmHg LVOT Vmax:         103.00 cm/s LVOT Vmean:        66.600 cm/s LVOT VTI:          0.226 m LVOT/AV VTI ratio: 0.84  AORTA Ao Root diam: 3.50 cm Ao Asc diam:  3.50 cm  MITRAL VALVE MV Area (PHT): 3.31 cm    SHUNTS MV Decel Time: 229 msec    Systemic VTI: 0.23 m MV E velocity: 76.00 cm/s MV A velocity: 65.50 cm/s MV E/A ratio:  1.16  Belva Boyden MD Electronically signed by Belva Boyden MD Signature Date/Time: 01/25/2024/12:43:56 PM    Final    MONITORS  LONG TERM MONITOR-LIVE TELEMETRY (3-14 DAYS) 10/02/2022  Narrative   14 day monitor   Rare supraventricular ectopy in the form of isolated PACs, couplets   Rare ventricular ectopy in the form of isolated PVCs, couplets   No symptoms reported   Patch Wear Time:  13 days and 23 hours (2023-10-24T08:42:39-0400 to 2023-11-07T07:34:58-0500)  Patient had a min HR of 43 bpm, max HR of 136 bpm, and avg HR of 68 bpm. Predominant underlying rhythm was Sinus Rhythm. First Degree AV Block was present. Isolated SVEs were rare (<1.0%), SVE Couplets were rare (<1.0%), and no SVE Triplets were present. Isolated VEs were rare (<1.0%), VE Couplets were rare (<1.0%), and no VE Triplets were present. Ventricular Bigeminy was present.   CT SCANS  CT CORONARY FRACTIONAL FLOW RESERVE DATA PREP 03/29/2020  Narrative EXAM: FFRCT ANALYSIS  FINDINGS: FFRct analysis was performed on the original cardiac CT angiogram dataset. Diagrammatic representation of the FFRct analysis is provided in a separate PDF document in PACS. This dictation was created using the PDF document and an interactive 3D model of the results. 3D model is not available in the EMR/PACS. Normal FFR range is >0.80.  1. Normal FFR through coronary arteries (lowest FFR 0.90 in distal segments).  IMPRESSION: Normal FFR. No evidence of flow limiting  CAD. Continue with medical management.  Spencer Gathers, MD [facility]  Note: These examples are not recommendations of [employer] and only provided as examples of what other customers are doing.   Electronically Signed By: Spencer Gathers MD On: 03/30/2020 06:22   CT CORONARY MORPH W/CTA COR W/SCORE 03/29/2020  Addendum 03/29/2020  7:21 PM ADDENDUM REPORT: 03/29/2020 19:19  CLINICAL DATA:  34M with chest pain  EXAM: Cardiac/Coronary  CT  TECHNIQUE: The patient was scanned on a [employer].  FINDINGS: A 120 kV prospective scan was triggered in the descending thoracic aorta at 111 HU's. Axial non-contrast 3 mm slices were carried out through the heart. The data set was analyzed on a dedicated work station and scored using the Agatson method. Gantry rotation speed was 250 msecs and collimation was .6 mm. No beta blockade and 0.8 mg of sl NTG was given. The 3D data set was reconstructed in 5% intervals of the 67-82 % of the R-R cycle. Diastolic phases were analyzed on a dedicated work station using MPR, MIP and VRT modes. The patient received 80 cc of contrast.  Aorta: Normal size. Ascending aorta 3.5 cm. No calcifications. No dissection.  Aortic Valve:  Trileaflet.  No calcifications.  Coronary Arteries:  Normal coronary origin.  Right dominance.  RCA is a large dominant artery that gives rise to PDA and PLVB. There is no mild (25-49%) soft attenuation plaque proximally, minimal (<25%) mixed plaque in the mid and distal RCA.  Left main is a large artery that gives rise to LAD and LCX arteries.  LAD is a large vessel that has minimal (<25%) calcified plaque proximally. There is moderate (50-69%) mixed plaque in the mid LAD. There is a small, branching D1 and normal D2 without disease.  LCX is a non-dominant artery that gives rise to one large OM1 branch. There is minimum calcified plaque in the proximal LCX.  Other findings:  Normal pulmonary vein drainage into the  left atrium.  Normal let atrial appendage without a thrombus.  Normal size of the pulmonary artery.  IMPRESSION: 1.  Poor quality study due to motion.  2. Coronary calcium  score of 330. This was 94th percentile for age and sex matched control.  2.  Normal coronary origin with right dominance.  3. Moderate plaque in the mid LAD with mild plaque in the RCA. Given the motion artifact this study is not a good candidate for FFRct.  4. Recommend aggressive risk factor modification including high potency statin. If symptoms persist, would have a low threshold to consider cardiac catheterization.  Maudine Sos, MD   Electronically Signed By: Maudine Sos On: 03/29/2020 19:19  Addendum 03/29/2020  4:46 PM ADDENDUM REPORT: 03/29/2020 16:44  CLINICAL DATA:  63 year old with chest pain, tobacco use.  EXAM: Cardiac/Coronary  CTA  TECHNIQUE: The patient was scanned on a [employer].  FINDINGS: A 90 kV prospective scan was triggered in the descending thoracic aorta at 111 HU's. Axial non-contrast 3 mm slices were carried out through the heart. The data set was analyzed on a dedicated work station and scored using the Agatson method. Gantry rotation speed was 250 msecs and collimation was .6 mm. Beta blockade and 0.8 mg of sl NTG was given. The 3D data set was reconstructed in 5% intervals of the 67-82 % of the R-R cycle. Diastolic phases were analyzed on a dedicated work station using MPR, MIP and VRT modes. The patient received 80 cc of contrast.  Aorta:  Normal size.  No calcifications.  No dissection.  Aortic Valve:  Trileaflet.  No calcifications.  Coronary Arteries:  Normal coronary origin.  Right dominance.  RCA is a large dominant artery that gives rise to PDA and PLA. There is both proximal and mid calcified (mid mixed) plaque that is non obstructive (0-24%).  Left main is a large artery that gives rise to LAD and LCX arteries. There is a small  focal calcified plaque, non flow limiting (0-24%).  LAD is a large vessel that has extensive prox to mid mixed calcified plaque that appears at points to be 50-69%, moderate in severity. Will send for CT FFR flow analysis.  LCX is a non-dominant artery that gives rise to one large OM1 branch. There is focal non flow limiting calcified plaque (0-24%). There is at the proximal takeoff of first large OM soft plaque that appears moderate (50-69%).  Other findings:  Normal pulmonary vein drainage into the left atrium.  Normal left atrial appendage without a thrombus.  Normal size of the pulmonary artery.  IMPRESSION: 1. Coronary calcium  score of 329. This was 45 percentile for age and sex matched control.  2. Normal coronary origin with right dominance.  3. Three vessel coronary mixed  calcified non calcified plaque with moderate severity in proximal first OM branch and mid LAD. Sending for FFR analysis.  Spencer Gathers, MD [facility]   Electronically Signed By: Spencer Gathers MD On: 03/29/2020 16:44  Narrative EXAM: OVER-READ INTERPRETATION  CT CHEST  The following report is an over-read performed by radiologist Dr. Janeece Mechanic of [facility] on 03/29/2020. This over-read does not include interpretation of cardiac or coronary anatomy or pathology. The coronary CTA interpretation by the cardiologist is attached.  COMPARISON:  None.  FINDINGS: Vascular: Heart is normal size.  Aorta normal caliber.  Mediastinum/Nodes: No adenopathy in the lower mediastinum or hila.  Lungs/Pleura: Dependent ground-glass opacities in the lungs, right greater than left could reflect atelectasis or scarring. There is peripheral interstitial thickening, likely early fibrosis. No confluent opacities or effusions.  Upper Abdomen: Imaging into the upper abdomen shows no acute findings.  Musculoskeletal: Chest wall soft tissues are unremarkable. No acute bony  abnormality.  IMPRESSION: No acute extra cardiac abnormality.  Peripheral interstitial thickening which likely reflects early fibrosis.  Electronically Signed: By: Janeece Mechanic M.D. On: 03/29/2020 15:42     ______________________________________________________________________________________________      Risk Assessment/Calculations:             Physical Exam:   VS:  BP 116/76 (BP Location: Left Arm, Patient Position: Sitting)   Pulse 76   Ht 6' (1.829 m)   Wt 140 lb 6.4 oz (63.7 kg)   SpO2 98%   BMI 19.04 kg/m    Wt Readings from Last 3 Encounters:  03/10/24 140 lb 6.4 oz (63.7 kg)  02/10/24 147 lb (66.7 kg)  02/03/24 147 lb 6.4 oz (66.9 kg)    GEN: Thin elderly male, well developed in no acute distress. Sitting comfortably in the chair  NECK: No JVD; No carotid bruits CARDIAC:  RRR, no murmurs, rubs, gallops. Radial pulses 2+ bilaterally  RESPIRATORY:  Clear to auscultation without rales, wheezing or rhonchi. Normal WOB on room air  ABDOMEN: Soft, non-tender, non-distended EXTREMITIES:  No edema in BLE; No deformity   ASSESSMENT AND PLAN: .    Chest Pain  Nonobstructive CAD  - Patient seen 3/7 and reported having episodes of chest pain that was worse with exertion, relieved with rest. Intensity had increased over the past 3 months. Underwent echocardiogram 01/25/24 that showed EF 55-60%, no wall motion abnormalities, normal RV function. Symptoms continued and he was set up or cardiac catheterization  - Cardiac catheterization 3/26 showed minimal, nonobstructive CAD. Normal LVEDP, normal LV function  - Patient continues to have shortness of breath on exertion. When he gets short of breath, he develops sharp, left sided chest pain. Pain is not reproducible on palpation. Not positional or pleuritic. BP is well controlled.  - At this point, his cardiac workup has been reassuring. I encouraged patient to follow up with his PCP to discuss noncardiac causes of chest pain.  As his chest pain only occurs when he is SOB, I question if it could be related to his COPD history. May ultimately need Pulm referral, but will defer to PCP  - Discussed importance of tobacco cessation  - Continue ASA 81 mg daily, crestor  20 mg daily  - Not on BB, amlodipine, imdur due to suspected orthostatic hypotension/syncope   Claudication  - Patient reports that he developed right leg pain on exertion. Symptoms are relieved by rest. Previously followed by [facility] for PAD  - Ordered lower extremity ABIs and ultrasounds    Recurrent  Syncope  - In the past, thought to be possibly vasovagal or orthostatic. 2 cardiac monitors (2021, 2023) were without significant arrhythmia  - Patient denies recent syncope. Has not had a syncopal episode since last October. Continues to have occasional dizziness, but it is overall better than it was in the past  - Encouraged patient to stay adequately hydrated    History of polysubstance abuse - Patient has history of alcohol  and cocaine use - Has not used cocaine in >2 years. Reports that he has not had any alcohol  in "a long time" - He does admit to cigarette use. Has cut back to 1 pack per week. We discussed importance of tobacco cessation    History of CVA vs hemiplegic migraine  - 10/2023- seen by neurology, felt to be more likely hemiplegic migraine as head CT was negative. Patient did not tolerate MRI to confirm  - Followed by neurology    Carotid Artery Disease  - Carotid ultrasounds from 08/2022 showed 1-49% stenosis in bilateral ICAs  - Continue ASA, crestor   - Ordered ultrasounds for monitoring - these are scheduled for later today    HLD  - Lipid panel from 08/2022 showed LDL 63 - Continue crestor  20 mg daily   Dispo: Follow up in 4 months with Dr. Renna Mendoza   Signed, Debria Fang, PA-C

## 2024-02-29 ENCOUNTER — Other Ambulatory Visit: Payer: Self-pay | Admitting: Family

## 2024-02-29 DIAGNOSIS — F5104 Psychophysiologic insomnia: Secondary | ICD-10-CM

## 2024-03-10 ENCOUNTER — Ambulatory Visit
Admission: RE | Admit: 2024-03-10 | Discharge: 2024-03-10 | Disposition: A | Discharge status: Home / Self Care | Source: Ambulatory Visit | Attending: Cardiology | Admitting: Cardiology

## 2024-03-10 ENCOUNTER — Ambulatory Visit (INDEPENDENT_AMBULATORY_CARE_PROVIDER_SITE_OTHER): Admitting: Cardiology

## 2024-03-10 ENCOUNTER — Encounter: Payer: Self-pay | Admitting: Cardiology

## 2024-03-10 VITALS — BP 116/76 | HR 76 | Ht 72.0 in | Wt 140.4 lb

## 2024-03-10 DIAGNOSIS — Z79899 Other long term (current) drug therapy: Secondary | ICD-10-CM | POA: Diagnosis not present

## 2024-03-10 DIAGNOSIS — I2510 Atherosclerotic heart disease of native coronary artery without angina pectoris: Secondary | ICD-10-CM | POA: Diagnosis not present

## 2024-03-10 DIAGNOSIS — Z8673 Personal history of transient ischemic attack (TIA), and cerebral infarction without residual deficits: Secondary | ICD-10-CM

## 2024-03-10 DIAGNOSIS — Z720 Tobacco use: Secondary | ICD-10-CM

## 2024-03-10 DIAGNOSIS — R0789 Other chest pain: Secondary | ICD-10-CM | POA: Diagnosis not present

## 2024-03-10 DIAGNOSIS — R55 Syncope and collapse: Secondary | ICD-10-CM | POA: Insufficient documentation

## 2024-03-10 DIAGNOSIS — I25118 Atherosclerotic heart disease of native coronary artery with other forms of angina pectoris: Secondary | ICD-10-CM | POA: Diagnosis not present

## 2024-03-10 DIAGNOSIS — I739 Peripheral vascular disease, unspecified: Secondary | ICD-10-CM | POA: Insufficient documentation

## 2024-03-10 LAB — BASIC METABOLIC PANEL WITH GFR
BUN/Creatinine Ratio: 8 — ABNORMAL LOW (ref 10–24)
BUN: 8 mg/dL (ref 8–27)
CO2: 22 mmol/L (ref 20–29)
Calcium: 10.1 mg/dL (ref 8.6–10.2)
Chloride: 102 mmol/L (ref 96–106)
Creatinine, Ser: 1.06 mg/dL (ref 0.76–1.27)
Glucose: 105 mg/dL — ABNORMAL HIGH (ref 70–99)
Potassium: 4.2 mmol/L (ref 3.5–5.2)
Sodium: 139 mmol/L (ref 134–144)
eGFR: 78 mL/min/{1.73_m2} (ref >59)

## 2024-03-10 NOTE — Patient Instructions (Signed)
 Medication Instructions:  NO CHANGES  *If you need a refill on your cardiac medications before your next appointment, please call your pharmacy*  Lab Work: BMET today   If you have labs (blood work) drawn today and your tests are completely normal, you will receive your results only by: MyChart Message (if you have MyChart) OR A paper copy in the mail If you have any lab test that is abnormal or we need to change your treatment, we will call you to review the results.  Testing/Procedures: Carotid Doppler TODAY   Leg ultrasound test (LEA and ABI) to look at blood flow -- to be scheduled.  Follow-Up: At [facility], you and your health needs are our priority.  As part of our continuing mission to provide you with exceptional heart care, our providers are all part of one team.  This team includes your primary Cardiologist (physician) and Advanced Practice Providers or APPs (Physician Assistants and Nurse Practitioners) who all work together to provide you with the care you need, when you need it.  Your next appointment:   4 months   Provider:    Dr. Dorothye Gathers  We recommend signing up for the patient portal called "MyChart".  Sign up information is provided on this After Visit Summary.  MyChart is used to connect with patients for Virtual Visits (Telemedicine).  Patients are able to view lab/test results, encounter notes, upcoming appointments, etc.  Non-urgent messages can be sent to your provider as well.   To learn more about what you can do with MyChart, go to [email or web address].   Other Instructions       1st Floor: - Lobby - Registration  - Pharmacy  - Lab - Cafe  2nd Floor: - PV Lab - Diagnostic Testing (echo, CT, nuclear med)  3rd Floor: - Vacant  4th Floor: - TCTS (cardiothoracic surgery) - AFib Clinic - Structural Heart Clinic - Vascular Surgery  - Vascular Ultrasound  5th Floor: - [facility] Cardiology (general and EP) - Clinical  Pharmacy for coumadin, hypertension, lipid, weight-loss medications, and med management appointments    Valet parking services will be available as well.

## 2024-03-14 ENCOUNTER — Telehealth: Payer: Self-pay

## 2024-03-14 NOTE — Telephone Encounter (Signed)
-----   Message from Debria Fang sent at 03/11/2024  8:27 AM EDT ----- Please tell patient that his lab work showed that his kidney function has returned to normal and creatinine is back down to 1.06. Electrolytes are all within normal limits. No changes to treatment plan at this time

## 2024-03-14 NOTE — Telephone Encounter (Signed)
 No answer no voice mail

## 2024-03-14 NOTE — Telephone Encounter (Signed)
-----   Message from Spencer Mendoza sent at 03/11/2024  8:26 AM EDT ----- Please tell patient that his carotid ultrasounds showed 1-39% stenosis in bilateral internal carotid arteries. This is stable compared to study from 11/2021. We will continue to follow this moving forward. OK to continue current medications and follow up as arranged

## 2024-03-15 NOTE — Telephone Encounter (Signed)
 Called patient advised of below they verbalized understanding Patient has no questions at this time.

## 2024-04-06 ENCOUNTER — Ambulatory Visit (HOSPITAL_COMMUNITY): Admission: RE | Admit: 2024-04-06 | Source: Ambulatory Visit

## 2024-04-06 ENCOUNTER — Ambulatory Visit (HOSPITAL_COMMUNITY)

## 2024-04-21 ENCOUNTER — Ambulatory Visit (HOSPITAL_COMMUNITY)
Admission: RE | Admit: 2024-04-21 | Discharge: 2024-04-21 | Disposition: A | Discharge status: Home / Self Care | Source: Ambulatory Visit | Attending: Vascular Surgery | Admitting: Vascular Surgery

## 2024-04-21 DIAGNOSIS — I739 Peripheral vascular disease, unspecified: Secondary | ICD-10-CM

## 2024-04-21 LAB — VAS US ABI WITH/WO TBI
Left ABI: 1.13
Right ABI: 1.12

## 2024-04-22 ENCOUNTER — Ambulatory Visit: Payer: Self-pay | Admitting: Cardiology

## 2024-04-26 NOTE — Telephone Encounter (Signed)
Phone unavailable.

## 2024-04-26 NOTE — Telephone Encounter (Signed)
-----   Message from Debria Fang sent at 04/22/2024  1:56 PM EDT ----- Please tell patient that their lower extremity ABIS were normal in both legs- this tells us  that he has normal blood flow in both of his legs. Lower extremity ultrasounds also showed normal blood flow. This is good news! No changes to current treatment plan

## 2024-04-27 NOTE — Telephone Encounter (Signed)
 Unable to leave message

## 2024-05-02 ENCOUNTER — Other Ambulatory Visit: Payer: Self-pay | Admitting: Family

## 2024-05-02 DIAGNOSIS — F410 Panic disorder [episodic paroxysmal anxiety] without agoraphobia: Secondary | ICD-10-CM

## 2024-05-02 NOTE — Telephone Encounter (Signed)
 Medication: Clonazepam  Directions: Take 1 tablet (0.5 mg total) by mouth daily as needed for anxiety (For panic attacks.).  Last given: 01/30/2024 Number refills: 2 Last o/v: 12/17/2023 Follow up:  Labs:  02/11/2024   Please review and refill if necessary.

## 2024-05-03 NOTE — Telephone Encounter (Signed)
-----   Message from Debria Fang sent at 04/22/2024  1:56 PM EDT ----- Please tell patient that their lower extremity ABIS were normal in both legs- this tells us  that he has normal blood flow in both of his legs. Lower extremity ultrasounds also showed normal blood flow. This is good news! No changes to current treatment plan

## 2024-05-03 NOTE — Telephone Encounter (Signed)
 controlled substance, needs OV - last visit virtual so needs to be in-office, please schedule, thx.

## 2024-05-03 NOTE — Telephone Encounter (Signed)
 Called patient advised of below they verbalized understanding.

## 2024-05-04 NOTE — Telephone Encounter (Signed)
 Called patient, phone rang then reached a message stating call can not be completed at this time.

## 2024-05-06 ENCOUNTER — Telehealth: Payer: Self-pay | Admitting: *Deleted

## 2024-05-06 DIAGNOSIS — F410 Panic disorder [episodic paroxysmal anxiety] without agoraphobia: Secondary | ICD-10-CM

## 2024-05-06 NOTE — Telephone Encounter (Signed)
 See other message

## 2024-05-06 NOTE — Telephone Encounter (Signed)
 Spencer Mendoza, pt requesting refill for Clonazepam  0.5 mg tablet. Does pt need an appt?

## 2024-05-06 NOTE — Telephone Encounter (Signed)
 Copied from CRM [id number]. Topic: Clinical - Medication Question >> May 05, 2024  4:51 PM Tiffany S wrote: Reason for CRM: clonazePAM  (KLONOPIN ) 0.5 MG tablet [Pharmacy Med Name: CLONAZEPAM  0.5MG  TABLETS] [[id number]]  Patient requeating call back because he said no one notified him that he would an appt first before getting prescription above please follow up

## 2024-05-06 NOTE — Telephone Encounter (Signed)
 Copied from CRM [id number]. Topic: Clinical - Medication Question >> May 04, 2024  9:33 AM Dewanda Foots wrote: Reason for CRM: Pt calling in to check on the status of his clonazePAM  (KLONOPIN ) 0.5 MG tablet. States the pharmacy told him it was denied. Upon looking, it just looks like it is waiting for the physician to sign off on it, not that it was denied. I let him know it can take up to 3 business days for the refill to be completed. >> May 05, 2024  1:01 PM Martinique E wrote: Patient called in regarding the status of this medication. Could see that PCP put in a reorder on 6/16, and the pharmacy told the patient today that they do not have this medication for the patient, which today would be the 3 day mark.

## 2024-05-09 ENCOUNTER — Encounter: Payer: Self-pay | Admitting: Family

## 2024-05-09 ENCOUNTER — Ambulatory Visit (INDEPENDENT_AMBULATORY_CARE_PROVIDER_SITE_OTHER): Admitting: Family

## 2024-05-09 VITALS — BP 128/69 | HR 76 | Temp 98.1°F | Ht 72.0 in | Wt 133.0 lb

## 2024-05-09 DIAGNOSIS — F410 Panic disorder [episodic paroxysmal anxiety] without agoraphobia: Secondary | ICD-10-CM

## 2024-05-09 DIAGNOSIS — Z1211 Encounter for screening for malignant neoplasm of colon: Secondary | ICD-10-CM | POA: Diagnosis not present

## 2024-05-09 DIAGNOSIS — F411 Generalized anxiety disorder: Secondary | ICD-10-CM | POA: Diagnosis not present

## 2024-05-09 DIAGNOSIS — R634 Abnormal weight loss: Secondary | ICD-10-CM

## 2024-05-09 MED ORDER — CLONAZEPAM 0.5 MG PO TABS
0.5000 mg | ORAL_TABLET | Freq: Every day | ORAL | 2 refills | Status: DC | PRN
Start: 2024-05-09 — End: 2024-08-03

## 2024-05-09 NOTE — Progress Notes (Signed)
 Patient ID: Spencer Mendoza, male    DOB: 01-23-1961, 63 y.o.   MRN: [id number]  Chief Complaint  Patient presents with   Weight Loss    Want to discuss some kind of supplement to help gain weight  Discussed the use of AI scribe software for clinical note transcription with the patient, who gave verbal consent to proceed.  History of Present Illness Spencer Mendoza is a 63 year old male with a seizure disorder who presents for a follow-up and medication refill.  He is experiencing issues with his Klonopin  refill, as the pharmacy denied his request despite prior approval. He is due for a refill as of two days ago. He has lost ten pounds over the last two weeks despite consuming high-calorie foods and drinks, which he attributes to difficulties with chewing due to dental issues. He experienced a recent fall in his bedroom, resulting in pain on his right side, particularly his hip, making it difficult to walk. This has limited his activity, including walking his dog and doing yard work. His current medications include Keppra  and zonisamide  for seizure control, and rosuvastatin  for cholesterol management. He is due for a colonoscopy, with the last one performed in 2021.  Assessment & Plan Weight Loss Weight loss likely due to dental issues affecting chewing. Scheduled for new dentures. Discussed potential nutritionist referral. - Provide printed materials on high-calorie foods. - Offer referral to a nutritionist, possibly via telehealth. - Instruct to report any issues with weight gain or medication.  Colonoscopy Follow-up Due for colonoscopy follow-up. Last one in 2021 with several large polyps removed, advised to return in 3 yrs. Referral to Dr. Teressa for follow-up. - Send referral for colonoscopy follow-up with Dr. Teressa.  Anxiety -  Taking Klonopin  0.5mg  qd prn. Living with his sister and she is willing to take responsibility for giving him Klonopin  0.5mg  prn for panic attacks as  prescribed, d/t pt hx of Benzo abuse.   - Refill sent - F/U in 3 month, virtual ok   Subjective:    Outpatient Medications Prior to Visit  Medication Sig Dispense Refill   acetaminophen  (TYLENOL ) 500 MG tablet Take 1,000 mg by mouth 2 (two) times daily as needed (pain).     albuterol  (VENTOLIN  HFA) 108 (90 Base) MCG/ACT inhaler INHALE 2 PUFFS BY MOUTH EVERY 4 HOURS AS NEEDED FOR SHORTNESS OF BREATH OR WHEEZING 8.5 g 5   aspirin  EC 81 MG tablet Take 1 tablet (81 mg total) by mouth daily. Swallow whole. 90 tablet 3   clonazePAM  (KLONOPIN ) 0.5 MG tablet Take 1 tablet (0.5 mg total) by mouth daily as needed for anxiety (For panic attacks.). 30 tablet 2   levETIRAcetam  (KEPPRA ) 1000 MG tablet Take 1 tablet (1,000 mg total) by mouth 2 (two) times daily. 180 tablet 3   nitroGLYCERIN  (NITROSTAT ) 0.4 MG SL tablet Place 1 tablet (0.4 mg total) under the tongue every 5 (five) minutes as needed for chest pain. 25 tablet 3   Omega-3 Fatty Acids (OMEGA 3 PO) Take 1 capsule by mouth 2 (two) times daily.     ondansetron  (ZOFRAN -ODT) 4 MG disintegrating tablet Take 1 tablet (4 mg total) by mouth every 8 (eight) hours as needed. 20 tablet 0   Probiotic Product (PROBIOTIC BLEND PO) Take 1 capsule by mouth daily.     rosuvastatin  (CRESTOR ) 20 MG tablet Take 1 tablet (20 mg total) by mouth daily. 90 tablet 3   SYMBICORT  160-4.5 MCG/ACT inhaler Inhale 2 puffs into the  lungs 2 (two) times daily.     traZODone  (DESYREL ) 100 MG tablet TAKE 1 TO 2 TABLETS(100 TO 200 MG) BY MOUTH AT BEDTIME 60 tablet 3   zonisamide  (ZONEGRAN ) 100 MG capsule Take 2 capsules (200 mg total) by mouth at bedtime. TAKE 2 CAPSULES(200 MG) BY MOUTH DAILY (Patient taking differently: Take 100 mg by mouth 2 (two) times daily.) 180 capsule 3   No facility-administered medications prior to visit.   Past Medical History:  Diagnosis Date   Abdominal pain 08/30/2019   Alcohol  withdrawal (HCC) 07/29/2013   Allergy    Anxiety    Arthritis     Benzodiazepine abuse (HCC) 08/30/2019   Bipolar disorder (HCC)    CAD (coronary artery disease)    Carotid arterial disease (HCC)    Chronic insomnia 06/06/2015   Chronic low back pain 12/28/2013   Chronic migraine without aura, with intractable migraine, so stated, with status migrainosus 08/25/2019   Claustrophobia    Cocaine abuse (HCC) 05/22/2015   COPD (chronic obstructive pulmonary disease) (HCC)    Delirium tremens (HCC) 07/29/2013   Epilepsy (HCC)    last seizure week of 06-25-2020- sev. grand mal seizures per pt    Exanthem due to herpes zoster 04/01/2021   Facial droop 09/07/2022   Fall at home, initial encounter 09/07/2022   Hemiplegic migraine with status migrainosus 06/06/2015   Hypertension 12/04/2013   Migraine    Multiple rib fractures 09/08/2022   Opioid use disorder 08/30/2019   Osteoarthritis of left wrist 01/04/2020   PAD (peripheral artery disease) (HCC)    PONV (postoperative nausea and vomiting)    Prolonged Q-T interval on ECG    Rib fractures 09/07/2022   Sinus bradycardia 09/07/2022   Stroke [facility])    patient denies- states had a hemiplegic migraine in ~2011   Substance abuse (HCC)    Syncope 09/07/2022   Past Surgical History:  Procedure Laterality Date   ANKLE SURGERY     in high school   APPENDECTOMY     COLONOSCOPY     KNEE ARTHROSCOPY Right    LEFT HEART CATH AND CORONARY ANGIOGRAPHY N/A 02/10/2024   Procedure: LEFT HEART CATH AND CORONARY ANGIOGRAPHY;  Surgeon: Swazil[place], Peter M, MD;  Location: [facility] INVASIVE CV LAB;  Service: Cardiovascular;  Laterality: N/A;   NOSE SURGERY     REVERSE SHOULDER ARTHROPLASTY Right 03/27/2022   Procedure: REVERSE SHOULDER ARTHROPLASTY;  Surgeon: Cristy Bonner DASEN, MD;  Location: [facility];  Service: Orthopedics;  Laterality: Right;   SHOULDER ARTHROSCOPY WITH DISTAL CLAVICLE RESECTION Right 12/04/2021   Procedure: SHOULDER ARTHROSCOPY WITH DISTAL CLAVICLE EXCISION;  Surgeon: Cristy Bonner DASEN, MD;  Location:  [facility];  Service: Orthopedics;  Laterality: Right;   SHOULDER ARTHROSCOPY WITH SUBACROMIAL DECOMPRESSION, ROTATOR CUFF REPAIR AND BICEP TENDON REPAIR Right 12/04/2021   Procedure: SHOULDER ARTHROSCOPY WITH SUBACROMIAL DECOMPRESSION, ROTATOR CUFF REPAIR AND BICEP TENDON REPAIR;  Surgeon: Cristy Bonner DASEN, MD;  Location: [facility];  Service: Orthopedics;  Laterality: Right;   Allergies  Allergen Reactions   Sertraline  Other (See Comments)    Erectile dysfunction   Codeine Nausea And Vomiting   Depakote  [Divalproex  Sodium] Other (See Comments)    Made patient shake   Migranal  [Dihydroergotamine ] Nausea And Vomiting   Topamax  [Topiramate ] Other (See Comments)    Made patient angry      Objective:    Physical Exam Vitals and nursing note reviewed.  Constitutional:      General: He  is not in acute distress.    Appearance: Normal appearance. He is underweight.  HENT:     Head: Normocephalic.   Cardiovascular:     Rate and Rhythm: Normal rate and regular rhythm.  Pulmonary:     Effort: Pulmonary effort is normal.     Breath sounds: Normal breath sounds.   Musculoskeletal:        General: Normal range of motion.     Cervical back: Normal range of motion.   Skin:    General: Skin is warm and dry.   Neurological:     Mental Status: He is alert and oriented to person, place, and time.   Psychiatric:        Mood and Affect: Mood normal.    BP 128/69   Pulse 76   Temp 98.1 F (36.7 C)   Ht 6' (1.829 m)   Wt 133 lb (60.3 kg)   SpO2 98%   BMI 18.04 kg/m  Wt Readings from Last 3 Encounters:  05/09/24 133 lb (60.3 kg)  03/10/24 140 lb 6.4 oz (63.7 kg)  02/10/24 147 lb (66.7 kg)      Lucius Krabbe, NP

## 2024-05-09 NOTE — Assessment & Plan Note (Signed)
 Taking Klonopin  0.5mg  qd prn. Living with his sister and she is willing to take responsibility for giving him Klonopin  0.5mg  prn for panic attacks as prescribed, d/t pt hx of Benzo abuse.   - Refill sent - F/U in 3 month, virtual ok

## 2024-06-06 ENCOUNTER — Other Ambulatory Visit: Payer: Self-pay | Admitting: Family

## 2024-06-06 DIAGNOSIS — J4489 Other specified chronic obstructive pulmonary disease: Secondary | ICD-10-CM

## 2024-07-06 ENCOUNTER — Other Ambulatory Visit: Payer: Self-pay | Admitting: Family

## 2024-07-06 DIAGNOSIS — F5104 Psychophysiologic insomnia: Secondary | ICD-10-CM

## 2024-07-11 ENCOUNTER — Ambulatory Visit: Attending: Cardiology | Admitting: Cardiology

## 2024-08-03 ENCOUNTER — Telehealth (INDEPENDENT_AMBULATORY_CARE_PROVIDER_SITE_OTHER): Admitting: Family

## 2024-08-03 ENCOUNTER — Encounter: Payer: Self-pay | Admitting: Family

## 2024-08-03 DIAGNOSIS — I25118 Atherosclerotic heart disease of native coronary artery with other forms of angina pectoris: Secondary | ICD-10-CM | POA: Diagnosis not present

## 2024-08-03 DIAGNOSIS — G4700 Insomnia, unspecified: Secondary | ICD-10-CM | POA: Diagnosis not present

## 2024-08-03 DIAGNOSIS — I700 Atherosclerosis of aorta: Secondary | ICD-10-CM | POA: Diagnosis not present

## 2024-08-03 DIAGNOSIS — F410 Panic disorder [episodic paroxysmal anxiety] without agoraphobia: Secondary | ICD-10-CM

## 2024-08-03 DIAGNOSIS — F332 Major depressive disorder, recurrent severe without psychotic features: Secondary | ICD-10-CM

## 2024-08-03 DIAGNOSIS — Z720 Tobacco use: Secondary | ICD-10-CM

## 2024-08-03 DIAGNOSIS — F5104 Psychophysiologic insomnia: Secondary | ICD-10-CM

## 2024-08-03 DIAGNOSIS — R569 Unspecified convulsions: Secondary | ICD-10-CM | POA: Diagnosis not present

## 2024-08-03 DIAGNOSIS — F411 Generalized anxiety disorder: Secondary | ICD-10-CM | POA: Diagnosis not present

## 2024-08-03 DIAGNOSIS — J4489 Other specified chronic obstructive pulmonary disease: Secondary | ICD-10-CM | POA: Diagnosis not present

## 2024-08-03 MED ORDER — TRAZODONE HCL 100 MG PO TABS: ORAL_TABLET | ORAL | 3 refills | Status: DC

## 2024-08-03 MED ORDER — CLONAZEPAM 0.5 MG PO TABS
0.5000 mg | ORAL_TABLET | Freq: Every day | ORAL | 2 refills | Status: AC | PRN
Start: 2024-08-03 — End: ?

## 2024-08-03 NOTE — Progress Notes (Addendum)
 MyChart Video Visit    Virtual Visit via Video Note   This format is felt to be most appropriate for this patient at this time. Physical exam was limited by quality of the video and audio technology used for the visit. CMA was able to get the patient set up on a video visit.  Patient location: Home. Patient and provider in visit Provider location: Office  I discussed the limitations of evaluation and management by telemedicine and the availability of in person appointments. The patient expressed understanding and agreed to proceed.  Visit Date: 08/03/2024  Today's healthcare provider: Lucius Krabbe, NP     Subjective:   Patient ID: Spencer Mendoza, male    DOB: 1961-08-31, 63 y.o.   MRN: [id number]  Chief Complaint  Patient presents with   Generalized anxiety disorder with panic attacks  Discussed the use of AI scribe software for clinical note transcription with the patient, who gave verbal consent to proceed.  History of Present Illness   Spencer Mendoza is a 63 year old male who presents with insomnia and panic attacks.  Insomnia - Difficulty initiating and maintaining sleep - Takes two trazodone  tablets nightly, which aids sleep  Panic attacks - Panic attacks occur on several nights, increasing in frequency - Episodes require walking around and going outside to calm down - Clonazepam  is less effective than initially, causing concern about reduced efficacy  Medication adherence and history - Takes rosuvastatin  for cholesterol management and is compliant with medication regimen - Takes Keppra  and zonisamide  for seizure management - History of drug use   Assessment and Plan    Abdominal aortic artherosclerosis - 50% occlusion Last imaging in 2023, but does not appear to have any aneurysm. Followed by cardiology.  Seizures Seizure management is ongoing with Keppra  and zonegran  as prescribed by neurology. - Continue Keppra  and zonegran  as prescribed by  neurology.  - Continue to follow with Neurology.  Generalized anxiety disorder Increased frequency of panic attacks at night despite current clonazepam  0.5mg  qd  regimen. Clonazepam  dose cannot be increased due to addiction hx and abuse risk, nor recommended d/t risk of dementia. Alternative medications and therapy options discussed as potential adjuncts to current treatment. - Continue clonazepam  at current dose. - Discussed alternative medications for anxiety management. - Encourage therapy and counseling. - F/U in 3 mos in office.  Insomnia Insomnia managed with trazodone  200mg  qhs, currently taking maximum dose. - Continue trazodone  at current dose.  - Continue to advise on not taking med nightly due to tolerance. - F/U in 3 mos   Past Medical History:  Diagnosis Date   Abdominal pain 08/30/2019   Alcohol  withdrawal (HCC) 07/29/2013   Allergy    Anxiety    Arthritis    Benzodiazepine abuse (HCC) 08/30/2019   Bipolar disorder (HCC)    CAD (coronary artery disease)    Carotid arterial disease (HCC)    Chronic insomnia 06/06/2015   Chronic low back pain 12/28/2013   Chronic migraine without aura, with intractable migraine, so stated, with status migrainosus 08/25/2019   Claustrophobia    Cocaine abuse (HCC) 05/22/2015   COPD (chronic obstructive pulmonary disease) (HCC)    Delirium tremens (HCC) 07/29/2013   Epilepsy (HCC)    last seizure week of 06-25-2020- sev. grand mal seizures per pt    Exanthem due to herpes zoster 04/01/2021   Facial droop 09/07/2022   Fall at home, initial encounter 09/07/2022   Hemiplegic migraine with status migrainosus 06/06/2015  Hypertension 12/04/2013   Migraine    Multiple rib fractures 09/08/2022   Opioid use disorder 08/30/2019   Osteoarthritis of left wrist 01/04/2020   PAD (peripheral artery disease) (HCC)    PONV (postoperative nausea and vomiting)    Prolonged Q-T interval on ECG    Rib fractures 09/07/2022   Sinus bradycardia  09/07/2022   Stroke [facility])    patient denies- states had a hemiplegic migraine in ~2011   Substance abuse (HCC)    Syncope 09/07/2022    Past Surgical History:  Procedure Laterality Date   ANKLE SURGERY     in high school   APPENDECTOMY     COLONOSCOPY     KNEE ARTHROSCOPY Right    LEFT HEART CATH AND CORONARY ANGIOGRAPHY N/A 02/10/2024   Procedure: LEFT HEART CATH AND CORONARY ANGIOGRAPHY;  Surgeon: Jordan, Peter M, MD;  Location: [facility] INVASIVE CV LAB;  Service: Cardiovascular;  Laterality: N/A;   NOSE SURGERY     REVERSE SHOULDER ARTHROPLASTY Right 03/27/2022   Procedure: REVERSE SHOULDER ARTHROPLASTY;  Surgeon: Cristy Bonner DASEN, MD;  Location: [facility];  Service: Orthopedics;  Laterality: Right;   SHOULDER ARTHROSCOPY WITH DISTAL CLAVICLE RESECTION Right 12/04/2021   Procedure: SHOULDER ARTHROSCOPY WITH DISTAL CLAVICLE EXCISION;  Surgeon: Cristy Bonner DASEN, MD;  Location: [facility];  Service: Orthopedics;  Laterality: Right;   SHOULDER ARTHROSCOPY WITH SUBACROMIAL DECOMPRESSION, ROTATOR CUFF REPAIR AND BICEP TENDON REPAIR Right 12/04/2021   Procedure: SHOULDER ARTHROSCOPY WITH SUBACROMIAL DECOMPRESSION, ROTATOR CUFF REPAIR AND BICEP TENDON REPAIR;  Surgeon: Cristy Bonner DASEN, MD;  Location: [facility];  Service: Orthopedics;  Laterality: Right;    Outpatient Medications Prior to Visit  Medication Sig Dispense Refill   acetaminophen  (TYLENOL ) 500 MG tablet Take 1,000 mg by mouth 2 (two) times daily as needed (pain).     albuterol  (VENTOLIN  HFA) 108 (90 Base) MCG/ACT inhaler INHALE 2 PUFFS BY MOUTH EVERY 4 HOURS AS NEEDED FOR SHORTNESS OF BREATH OR WHEEZING 8.5 g 5   aspirin  EC 81 MG tablet Take 1 tablet (81 mg total) by mouth daily. Swallow whole. 90 tablet 3   levETIRAcetam  (KEPPRA ) 1000 MG tablet Take 1 tablet (1,000 mg total) by mouth 2 (two) times daily. 180 tablet 3   nitroGLYCERIN  (NITROSTAT ) 0.4 MG SL tablet Place 1 tablet (0.4 mg total) under the  tongue every 5 (five) minutes as needed for chest pain. 25 tablet 3   Omega-3 Fatty Acids (OMEGA 3 PO) Take 1 capsule by mouth 2 (two) times daily.     ondansetron  (ZOFRAN -ODT) 4 MG disintegrating tablet Take 1 tablet (4 mg total) by mouth every 8 (eight) hours as needed. 20 tablet 0   Probiotic Product (PROBIOTIC BLEND PO) Take 1 capsule by mouth daily.     rosuvastatin  (CRESTOR ) 20 MG tablet Take 1 tablet (20 mg total) by mouth daily. 90 tablet 3   SYMBICORT  160-4.5 MCG/ACT inhaler INHALE 2 PUFFS INTO THE LUNGS TWICE DAILY 10.2 g 0   traZODone  (DESYREL ) 100 MG tablet TAKE 1 TO 2 TABLETS(100 TO 200 MG) BY MOUTH AT BEDTIME 60 tablet 3   zonisamide  (ZONEGRAN ) 100 MG capsule Take 2 capsules (200 mg total) by mouth at bedtime. TAKE 2 CAPSULES(200 MG) BY MOUTH DAILY (Patient taking differently: Take 100 mg by mouth 2 (two) times daily.) 180 capsule 3   clonazePAM  (KLONOPIN ) 0.5 MG tablet Take 1 tablet (0.5 mg total) by mouth daily as needed for anxiety (For panic attacks.). 30 tablet  2   No facility-administered medications prior to visit.    Allergies  Allergen Reactions   Sertraline  Other (See Comments)    Erectile dysfunction   Codeine Nausea And Vomiting   Depakote  [Divalproex  Sodium] Other (See Comments)    Made patient shake   Migranal  [Dihydroergotamine ] Nausea And Vomiting   Topamax  [Topiramate ] Other (See Comments)    Made patient angry       Objective:   Physical Exam Vitals and nursing note reviewed.  Constitutional:      General: Pt is not in acute distress.    Appearance: Normal appearance.  HENT:     Head: Normocephalic.  Pulmonary:     Effort: No respiratory distress.  Musculoskeletal:     Cervical back: Normal range of motion.  Skin:    General: Skin is dry.     Coloration: Skin is not pale.  Neurological:     Mental Status: Pt is alert and oriented to person, place, and time.  Psychiatric:        Mood and Affect: Mood normal.   There were no vitals taken  for this visit.  Wt Readings from Last 3 Encounters:  05/09/24 133 lb (60.3 kg)  03/10/24 140 lb 6.4 oz (63.7 kg)  02/10/24 147 lb (66.7 kg)      I discussed the assessment and treatment plan with the patient. The patient was provided an opportunity to ask questions and all were answered. The patient agreed with the plan and demonstrated an understanding of the instructions.   The patient was advised to call back or seek an in-person evaluation if the symptoms worsen or if the condition fails to improve as anticipated.  Lucius Krabbe, NP [facility] [phone] (phone) [phone] (fax)  [facility]

## 2024-08-03 NOTE — Assessment & Plan Note (Signed)
 Taking Klonopin  0.5mg  qd prn. Living with his sister and she is willing to take responsibility for giving him Klonopin  0.5mg  prn for panic attacks as prescribed, d/t pt hx of Benzo abuse.   - Refill sent - F/U in 3 month, in office

## 2024-08-09 ENCOUNTER — Ambulatory Visit: Admitting: Family

## 2024-10-20 ENCOUNTER — Telehealth: Payer: Self-pay | Admitting: Family

## 2024-10-20 NOTE — Telephone Encounter (Signed)
 Unable to reach Patient by phone. Sent MyChart message to inform Patient of appointment change on 10/26/24 to IN Office per AVS.

## 2024-10-26 ENCOUNTER — Ambulatory Visit: Admitting: Family

## 2024-10-27 ENCOUNTER — Encounter: Payer: Self-pay | Admitting: Family

## 2024-10-27 ENCOUNTER — Ambulatory Visit: Admitting: Family

## 2024-10-27 VITALS — BP 110/62 | HR 50 | Temp 97.5°F | Ht 72.0 in | Wt 145.2 lb

## 2024-10-27 DIAGNOSIS — Z1211 Encounter for screening for malignant neoplasm of colon: Secondary | ICD-10-CM

## 2024-10-27 DIAGNOSIS — F332 Major depressive disorder, recurrent severe without psychotic features: Secondary | ICD-10-CM

## 2024-10-27 DIAGNOSIS — I25118 Atherosclerotic heart disease of native coronary artery with other forms of angina pectoris: Secondary | ICD-10-CM

## 2024-10-27 DIAGNOSIS — F410 Panic disorder [episodic paroxysmal anxiety] without agoraphobia: Secondary | ICD-10-CM

## 2024-10-27 DIAGNOSIS — F411 Generalized anxiety disorder: Secondary | ICD-10-CM

## 2024-10-27 DIAGNOSIS — I700 Atherosclerosis of aorta: Secondary | ICD-10-CM

## 2024-10-27 DIAGNOSIS — Z79899 Other long term (current) drug therapy: Secondary | ICD-10-CM

## 2024-10-27 DIAGNOSIS — J4489 Other specified chronic obstructive pulmonary disease: Secondary | ICD-10-CM

## 2024-10-27 DIAGNOSIS — Z8673 Personal history of transient ischemic attack (TIA), and cerebral infarction without residual deficits: Secondary | ICD-10-CM

## 2024-10-27 LAB — COMPREHENSIVE METABOLIC PANEL WITH GFR
ALT: 13 U/L (ref 0–53)
AST: 18 U/L (ref 0–37)
Albumin: 4.3 g/dL (ref 3.5–5.2)
Alkaline Phosphatase: 50 U/L (ref 39–117)
BUN: 8 mg/dL (ref 6–23)
CO2: 32 meq/L (ref 19–32)
Calcium: 9.4 mg/dL (ref 8.4–10.5)
Chloride: 100 meq/L (ref 96–112)
Creatinine, Ser: 1 mg/dL (ref 0.40–1.50)
GFR: 78.91 mL/min (ref >60.00)
Glucose, Bld: 97 mg/dL (ref 70–99)
Potassium: 4.1 meq/L (ref 3.5–5.1)
Sodium: 137 meq/L (ref 135–145)
Total Bilirubin: 0.3 mg/dL (ref 0.2–1.2)
Total Protein: 6.5 g/dL (ref 6.0–8.3)

## 2024-10-27 LAB — LIPID PANEL
Cholesterol: 180 mg/dL (ref 0–200)
HDL: 98 mg/dL (ref >39.00)
LDL Cholesterol: 72 mg/dL (ref 0–99)
NonHDL: 81.96
Total CHOL/HDL Ratio: 2
Triglycerides: 51 mg/dL (ref 0.0–149.0)
VLDL: 10.2 mg/dL (ref 0.0–40.0)

## 2024-10-27 MED ORDER — ROSUVASTATIN CALCIUM 5 MG PO TABS: 5.0000 mg | ORAL_TABLET | Freq: Every day | ORAL | 3 refills | Status: AC

## 2024-10-27 MED ORDER — SYMBICORT 160-4.5 MCG/ACT IN AERO: 2.0000 | INHALATION_SPRAY | Freq: Two times a day (BID) | RESPIRATORY_TRACT | 5 refills | Status: AC

## 2024-10-27 MED ORDER — CLONAZEPAM 0.5 MG PO TABS: 0.5000 mg | ORAL_TABLET | Freq: Every day | ORAL | 2 refills | Status: AC | PRN

## 2024-10-27 NOTE — Progress Notes (Signed)
 Patient ID: Spencer Mendoza, male    DOB: 1961-06-27, 63 y.o.   MRN: [id number]  Chief Complaint  Patient presents with   Generalized anxiety disorder with panic attacks    Follow up  Discussed the use of AI scribe software for clinical note transcription with the patient, who gave verbal consent to proceed.  History of Present Illness Spencer Mendoza is a 63 year old male with aortic atherosclerosis and coronary artery disease who presents for medication refills and follow-up. He is accompanied by his sister.  He has not had recent labs done through this clinic. A home blood test by [employer] in the summer reportedly showed good cholesterol, but he does not have the results.  He stopped rosuvastatin  and zonisamide  because of nausea and headaches occurring within 20 minutes of his morning medications. Since stopping them he has had no morning headaches or nausea and has gained weight. He believes zonisamide  caused most of the symptoms. He continues Keppra  for seizures and has had no seizures since stopping zonisamide . He has aortic atherosclerosis, coronary artery disease, and prior stroke. Rosuvastatin  had been increased from 10 mg to 20 mg before he stopped it. He undergoes colonoscopy every three years for prior findings, but is currently overdue for screening. He has not seen neurology recently and is running low on Keppra . He is also running low on clonazepam  and Symbicort . He notes weight gain since the summer, which he links to improved appetite and energy after stopping zonisamide .  Assessment & Plan Coronary artery disease with stable angina High risk for cardiovascular events due to aortic atherosclerosis and history of stroke. Previous intolerance to rosuvastatin  likely related to zonisamide . - Finish current Crestor  (Rosuvastatin ) 20mg  dose by taking 1/2 pill every other day. - Reduced dose of rosuvastatin  to 5 mg daily once above bottle completed. If symptoms recur,  take 5 mg every other day. - Ordered cholesterol, liver, and kidney function tests.  Abdominal aortic atherosclerosis Presence of plaque in the aorta. High risk for cardiovascular events due to coronary artery disease and history of stroke. - Recheck lipid panel today - Resume taking Crestor  daily. - Continue monitoring with cardiology.  History of stroke Contributes to high risk for recurrent cardiovascular events. - Recheck lipid panel today - Resume taking Crestor  today. - Continue monitoring with cardiology.  Anxiety Taking Clonazepam  0.5mg  qd, tolerating, sister administering. - Sending refill for Clonazepam  0.5mg  qd prn - F/U in 3-4 mos when refill needed, ok if virtual  Chronic obstructive pulmonary disease with chronic bronchitis Requires ongoing management with inhalers. - Refilled Symbicort  inhaler. - Ensured albuterol  rescue inhaler is available.  Seizure disorder Managed with Keppra . No recent seizures reported. Previous use of zonisamide  discontinued due to side effects. - Contact neurology for Keppra  refill and follow-up. - Ensure neurology is aware of discontinuation of zonisamide .  General Health Maintenance Routine health maintenance discussed, including colonoscopy screening. - Sent referral for colonoscopy. - Ordered routine labs including cholesterol, liver, and kidney function tests.   Subjective:    Outpatient Medications Prior to Visit  Medication Sig Dispense Refill   acetaminophen  (TYLENOL ) 500 MG tablet Take 1,000 mg by mouth 2 (two) times daily as needed (pain).     albuterol  (VENTOLIN  HFA) 108 (90 Base) MCG/ACT inhaler INHALE 2 PUFFS BY MOUTH EVERY 4 HOURS AS NEEDED FOR SHORTNESS OF BREATH OR WHEEZING 8.5 g 5   aspirin  EC 81 MG tablet Take 1 tablet (81 mg total) by mouth daily. Swallow whole. 90  tablet 3   clonazePAM  (KLONOPIN ) 0.5 MG tablet Take 1 tablet (0.5 mg total) by mouth daily as needed for anxiety (For panic attacks.). 30 tablet 2    levETIRAcetam  (KEPPRA ) 1000 MG tablet Take 1 tablet (1,000 mg total) by mouth 2 (two) times daily. 180 tablet 3   nitroGLYCERIN  (NITROSTAT ) 0.4 MG SL tablet Place 1 tablet (0.4 mg total) under the tongue every 5 (five) minutes as needed for chest pain. 25 tablet 3   ondansetron  (ZOFRAN -ODT) 4 MG disintegrating tablet Take 1 tablet (4 mg total) by mouth every 8 (eight) hours as needed. 20 tablet 0   Probiotic Product (PROBIOTIC BLEND PO) Take 1 capsule by mouth daily.     SYMBICORT  160-4.5 MCG/ACT inhaler INHALE 2 PUFFS INTO THE LUNGS TWICE DAILY 10.2 g 0   traZODone  (DESYREL ) 100 MG tablet TAKE 1 TO 2 TABLETS(100 TO 200 MG) BY MOUTH AT BEDTIME 60 tablet 3   Omega-3 Fatty Acids (OMEGA 3 PO) Take 1 capsule by mouth 2 (two) times daily.     rosuvastatin  (CRESTOR ) 20 MG tablet Take 1 tablet (20 mg total) by mouth daily. (Patient not taking: Reported on 10/27/2024) 90 tablet 3   zonisamide  (ZONEGRAN ) 100 MG capsule Take 2 capsules (200 mg total) by mouth at bedtime. TAKE 2 CAPSULES(200 MG) BY MOUTH DAILY (Patient not taking: Reported on 10/27/2024) 180 capsule 3   No facility-administered medications prior to visit.   Past Medical History:  Diagnosis Date   Abdominal pain 08/30/2019   Alcohol  withdrawal (HCC) 07/29/2013   Allergy    Anxiety    Arthritis    Benzodiazepine abuse (HCC) 08/30/2019   Bipolar disorder (HCC)    CAD (coronary artery disease)    Carotid arterial disease    Chronic insomnia 06/06/2015   Chronic low back pain 12/28/2013   Chronic migraine without aura, with intractable migraine, so stated, with status migrainosus 08/25/2019   Claustrophobia    Cocaine abuse (HCC) 05/22/2015   COPD (chronic obstructive pulmonary disease) (HCC)    Delirium tremens (HCC) 07/29/2013   Epilepsy (HCC)    last seizure week of 06-25-2020- sev. grand mal seizures per pt    Exanthem due to herpes zoster 04/01/2021   Facial droop 09/07/2022   Fall at home, initial encounter 09/07/2022    Hemiplegic migraine with status migrainosus 06/06/2015   Hypertension 12/04/2013   Migraine    Multiple rib fractures 09/08/2022   Opioid use disorder 08/30/2019   Osteoarthritis of left wrist 01/04/2020   PAD (peripheral artery disease)    PONV (postoperative nausea and vomiting)    Prolonged Q-T interval on ECG    Rib fractures 09/07/2022   Sinus bradycardia 09/07/2022   Stroke [facility])    patient denies- states had a hemiplegic migraine in ~2011   Substance abuse (HCC)    Syncope 09/07/2022   Past Surgical History:  Procedure Laterality Date   ANKLE SURGERY     in high school   APPENDECTOMY     COLONOSCOPY     KNEE ARTHROSCOPY Right    LEFT HEART CATH AND CORONARY ANGIOGRAPHY N/A 02/10/2024   Procedure: LEFT HEART CATH AND CORONARY ANGIOGRAPHY;  Surgeon: Jordan, Peter M, MD;  Location: [facility] INVASIVE CV LAB;  Service: Cardiovascular;  Laterality: N/A;   NOSE SURGERY     REVERSE SHOULDER ARTHROPLASTY Right 03/27/2022   Procedure: REVERSE SHOULDER ARTHROPLASTY;  Surgeon: Cristy Bonner DASEN, MD;  Location: [facility];  Service: Orthopedics;  Laterality: Right;   SHOULDER  ARTHROSCOPY WITH DISTAL CLAVICLE RESECTION Right 12/04/2021   Procedure: SHOULDER ARTHROSCOPY WITH DISTAL CLAVICLE EXCISION;  Surgeon: Cristy Bonner DASEN, MD;  Location: [facility];  Service: Orthopedics;  Laterality: Right;   SHOULDER ARTHROSCOPY WITH SUBACROMIAL DECOMPRESSION, ROTATOR CUFF REPAIR AND BICEP TENDON REPAIR Right 12/04/2021   Procedure: SHOULDER ARTHROSCOPY WITH SUBACROMIAL DECOMPRESSION, ROTATOR CUFF REPAIR AND BICEP TENDON REPAIR;  Surgeon: Cristy Bonner DASEN, MD;  Location: [facility];  Service: Orthopedics;  Laterality: Right;   Allergies[1]    Objective:    Physical Exam Vitals and nursing note reviewed.  Constitutional:      General: He is not in acute distress.    Appearance: Normal appearance.  HENT:     Head: Normocephalic.  Cardiovascular:     Rate and Rhythm:  Normal rate and regular rhythm.  Pulmonary:     Effort: Pulmonary effort is normal.     Breath sounds: Normal breath sounds.  Musculoskeletal:        General: Normal range of motion.     Cervical back: Normal range of motion.  Skin:    General: Skin is warm and dry.  Neurological:     Mental Status: He is alert and oriented to person, place, and time.  Psychiatric:        Mood and Affect: Mood normal.    BP 110/62 (BP Location: Left Arm, Patient Position: Sitting, Cuff Size: Normal)   Pulse (!) 50   Temp (!) 97.5 F (36.4 C) (Temporal)   Ht 6' (1.829 m)   Wt 145 lb 3.2 oz (65.9 kg)   SpO2 97%   BMI 19.69 kg/m  Wt Readings from Last 3 Encounters:  10/27/24 145 lb 3.2 oz (65.9 kg)  05/09/24 133 lb (60.3 kg)  03/10/24 140 lb 6.4 oz (63.7 kg)       Emalynn Clewis, NP     [1]  Allergies Allergen Reactions   Sertraline  Other (See Comments)    Erectile dysfunction   Codeine Nausea And Vomiting   Depakote  [Divalproex  Sodium] Other (See Comments)    Made patient shake   Migranal  [Dihydroergotamine ] Nausea And Vomiting   Topamax  [Topiramate ] Other (See Comments)    Made patient angry

## 2024-10-28 ENCOUNTER — Ambulatory Visit: Payer: Self-pay | Admitting: Family

## 2024-11-07 ENCOUNTER — Ambulatory Visit

## 2024-11-08 ENCOUNTER — Ambulatory Visit

## 2024-11-23 ENCOUNTER — Ambulatory Visit: Payer: Self-pay

## 2024-11-23 NOTE — Telephone Encounter (Signed)
 See triage note as FYI; pt advised to go to ED

## 2024-11-23 NOTE — Telephone Encounter (Signed)
 I reached out to patient, Patient states is unable to afford care at [facility], ED or [facility] who he previously seen for shoulder concerns.

## 2024-11-23 NOTE — Telephone Encounter (Signed)
 ED advised, pt refusing. Pt requesting referral to someone for his leg. Please advise.   FYI Only or Action Required?: Action required by provider: update on patient condition.  Patient was last seen in primary care on 10/27/2024 by Lucius Krabbe, NP.  Called Nurse Triage reporting Fall and Shoulder Injury.  Symptoms began today.  Interventions attempted: Nothing.  Symptoms are: gradually worsening.  Triage Disposition: Go to ED Now (Notify PCP)  Patient/caregiver understands and will follow disposition?: No, refuses disposition  Copied from CRM #[id number]. Topic: Clinical - Red Word Triage >> Nov 23, 2024 12:40 PM Carlyon D wrote: Red Word that prompted transfer to Nurse Triage: Pt says he keeps falling due to right leg giving out and has re hurt his shoulder and has hit his head multiple times.   Pt is having pain in the right leg as well. Reason for Disposition  Sounds like a serious injury to the triager  Answer Assessment - Initial Assessment Questions 1. MECHANISM: How did the fall happen?     My right leg gives out, it's like it folds. Like I don't have a right leg   3. ONSET: When did the fall happen? (e.g., minutes, hours, or days ago)     This AM  4. LOCATION: What part of the body hit the ground? (e.g., back, buttocks, head, hips, knees, hands, head, stomach)     R shoulder and head  5. INJURY: Did you hurt (injure) yourself when you fell? If Yes, ask: What did you injure? Tell me more about this? (e.g., body area; type of injury; pain severity)     Yes, right shoulder and head  6. PAIN: Is there any pain? If Yes, ask: How bad is the pain? (e.g., Scale 0-10; or none, mild,      Reports severe right shoulder pain  10. CAUSE: What do you think caused the fall (or falling)? (e.g., dizzy spell, tripped)       Leg gave out  Reports LOC after head strike this morning. Maybe two seconds per sister of patient. HA. Pt speaking slowly and with a  stutter. Reports the stutter is chronic, secondary to epilepsy.  Protocols used: Falls and [facility]

## 2024-11-23 NOTE — Telephone Encounter (Signed)
 He can call [facility] and see if they will work with him on his copay (set up a payment plan), but they may require he be seen by me first for a referral. Can give him the phone #, thx.

## 2024-11-23 NOTE — Telephone Encounter (Signed)
 Yes, should see ED due to head injuries, can try [facility] after hours clinic at [facility] or [address] between 5-8pm if needs xray & treatment.

## 2024-11-25 NOTE — Telephone Encounter (Signed)
 Called and spoke to patient and he expressed that he is not financially capable to afford any care at this time. Patient refused to complete a visit with provider including video visit. Patient expressed that he'd explore his options and notify the office of any help he thinks he'd need.

## 2024-12-01 ENCOUNTER — Telehealth: Payer: Self-pay

## 2024-12-01 NOTE — Telephone Encounter (Signed)
 Copied from CRM [id number]. Topic: General - Call Back - No Documentation >> Dec 01, 2024 11:41 AM Antony RAMAN wrote: Reason for CRM: patient said he missed a call a few mins ago, no notes  I returned patients call, Previously left a voicemail for patient on 1/7 but this has already been addressed.

## 2024-12-20 ENCOUNTER — Other Ambulatory Visit: Payer: Self-pay | Admitting: Family

## 2024-12-20 DIAGNOSIS — F5104 Psychophysiologic insomnia: Secondary | ICD-10-CM

## 2025-01-09 ENCOUNTER — Ambulatory Visit: Admitting: Gastroenterology
